# Patient Record
Sex: Female | Born: 1960 | Race: White | Hispanic: No | Marital: Married | Smoking: Former smoker
Health system: Southern US, Community
[De-identification: ages and names within clinical notes are randomized; demographics above are authoritative.]

## PROBLEM LIST (undated history)

## (undated) DIAGNOSIS — C3490 Malignant neoplasm of unspecified part of unspecified bronchus or lung: Secondary | ICD-10-CM

## (undated) DIAGNOSIS — C189 Malignant neoplasm of colon, unspecified: Secondary | ICD-10-CM

## (undated) DIAGNOSIS — Z9221 Personal history of antineoplastic chemotherapy: Secondary | ICD-10-CM

## (undated) DIAGNOSIS — Z8589 Personal history of malignant neoplasm of other organs and systems: Secondary | ICD-10-CM

## (undated) DIAGNOSIS — I10 Essential (primary) hypertension: Secondary | ICD-10-CM

## (undated) DIAGNOSIS — C55 Malignant neoplasm of uterus, part unspecified: Secondary | ICD-10-CM

## (undated) DIAGNOSIS — E119 Type 2 diabetes mellitus without complications: Secondary | ICD-10-CM

## (undated) HISTORY — PX: ABDOMINAL HYSTERECTOMY: SHX81

## (undated) HISTORY — PX: CHOLECYSTECTOMY: SHX55

## (undated) HISTORY — PX: COLONOSCOPY: SHX174

## (undated) HISTORY — PX: COLON SURGERY: SHX602

---

## 2001-09-16 DIAGNOSIS — Z9221 Personal history of antineoplastic chemotherapy: Secondary | ICD-10-CM

## 2001-09-16 DIAGNOSIS — C189 Malignant neoplasm of colon, unspecified: Secondary | ICD-10-CM

## 2001-09-16 HISTORY — DX: Malignant neoplasm of colon, unspecified: C18.9

## 2001-09-16 HISTORY — DX: Personal history of antineoplastic chemotherapy: Z92.21

## 2005-09-16 DIAGNOSIS — C55 Malignant neoplasm of uterus, part unspecified: Secondary | ICD-10-CM

## 2005-09-16 HISTORY — DX: Malignant neoplasm of uterus, part unspecified: C55

## 2006-01-29 ENCOUNTER — Ambulatory Visit: Payer: Self-pay

## 2006-04-02 ENCOUNTER — Ambulatory Visit: Payer: Self-pay | Admitting: Gynecologic Oncology

## 2006-04-25 ENCOUNTER — Inpatient Hospital Stay: Payer: Self-pay | Admitting: Obstetrics and Gynecology

## 2006-04-25 IMAGING — CR DG ABDOMEN 1V
1 series · 1 of 1 positions shown · non-contrast
Comparison: none

REASON FOR EXAM: stent insertion in the operating room
COMMENTS:

[view not recorded]
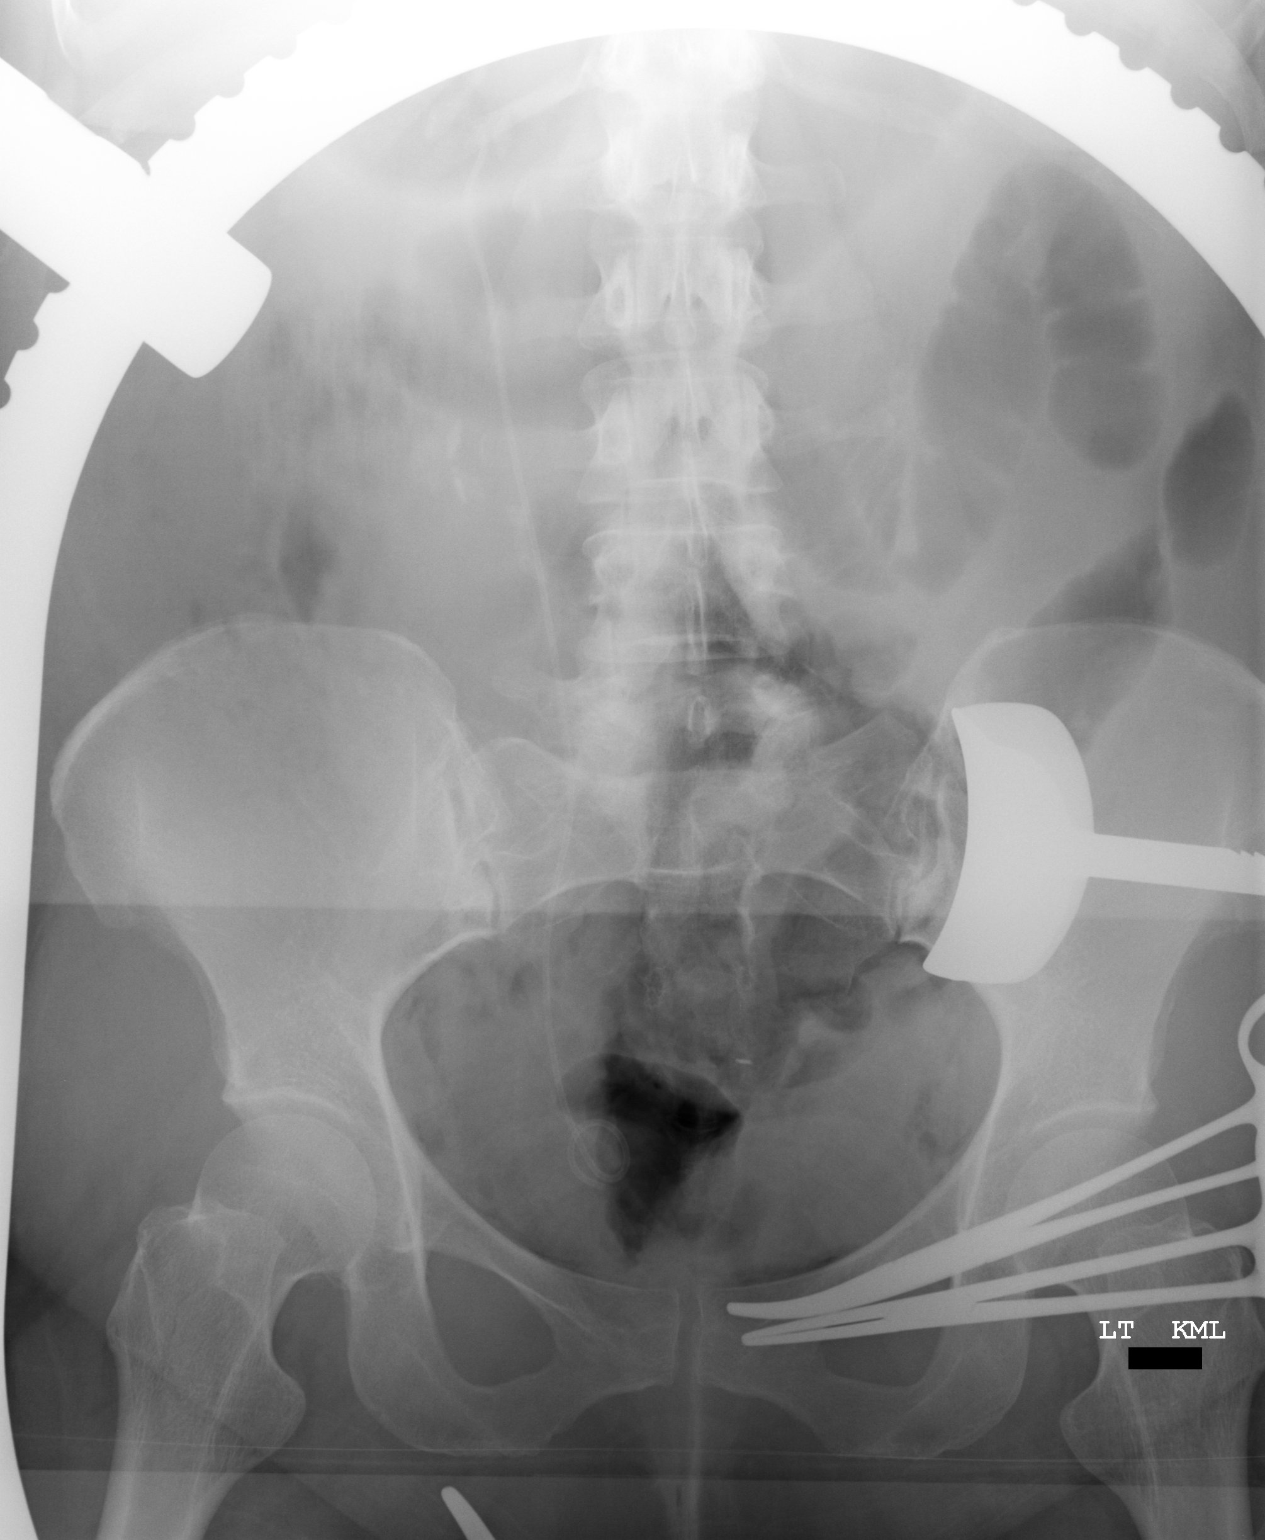

[1 of 1 positions shown; findings below may reference images not displayed]

PROCEDURE:     DXR - DXR KIDNEY URETER BLADDER  - [DATE]  [DATE]

RESULT:          The soft tissue structures are unremarkable.  A double-J
stent is noted on the RIGHT.  Surgical clips or calcifications are noted on
the RIGHT.  A similar finding is noted in the pelvis.  A straight and a
curved hemostat is noted projected over the LEFT pelvis.  What appears to be
a curved hemostat is projected over the RIGHT lower pelvis.  Retractors are
noted.  The densities noted in the RIGHT abdomen and in the pelvis are most
likely calcifications or surgical clips.
IMPRESSION: Good position of RIGHT ureteral double-J stent.

## 2006-05-14 ENCOUNTER — Ambulatory Visit: Payer: Self-pay | Admitting: Gynecologic Oncology

## 2006-05-17 ENCOUNTER — Ambulatory Visit: Payer: Self-pay | Admitting: Gynecologic Oncology

## 2007-04-30 ENCOUNTER — Ambulatory Visit: Payer: Self-pay | Admitting: Obstetrics and Gynecology

## 2007-04-30 IMAGING — CT CT ABD-PELV W/ CM
1 of 2 series · 15 of 32 positions shown, 19 images · non-contrast
Comparison: none

REASON FOR EXAM: History of colon CA
COMMENTS:

[Series 2: abdomen · axial · 0.94mm/px · z∈[+240,+688]mm · 15 of 62 slices shown, 19 images]
[im 3/62  soft-tissue]
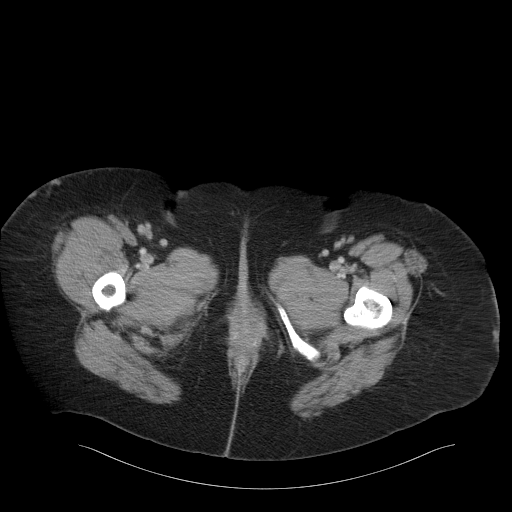
[im 3/62  bone]
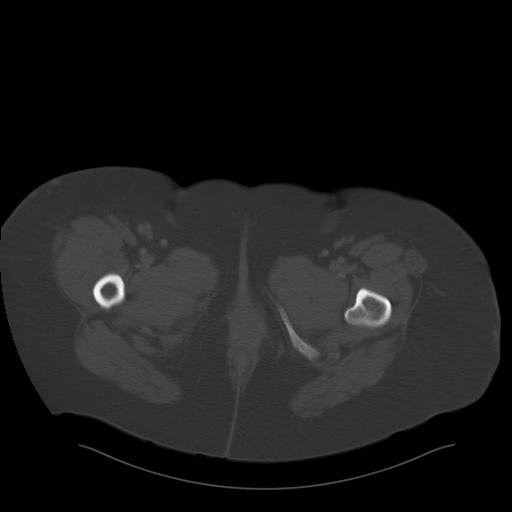
[im 8/62  soft-tissue]
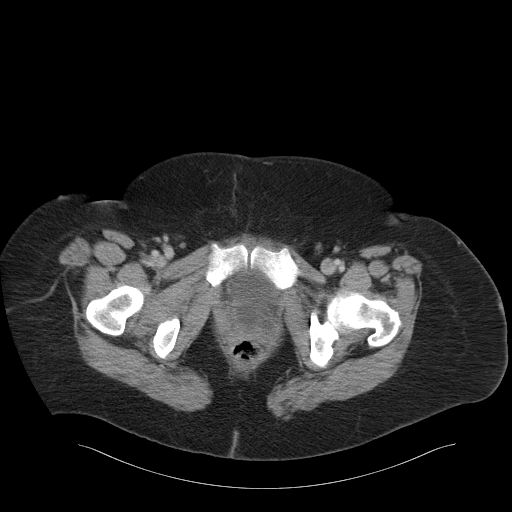
[im 13/62  soft-tissue]
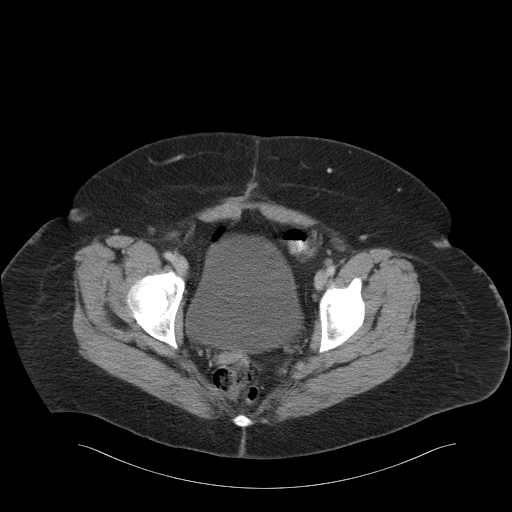
[im 18/62  soft-tissue]
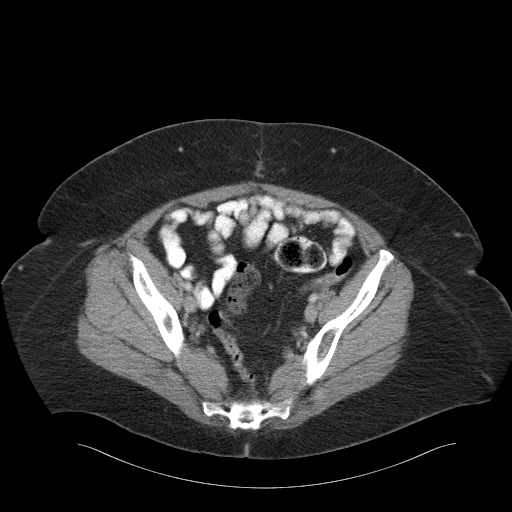
[im 21/62  soft-tissue]
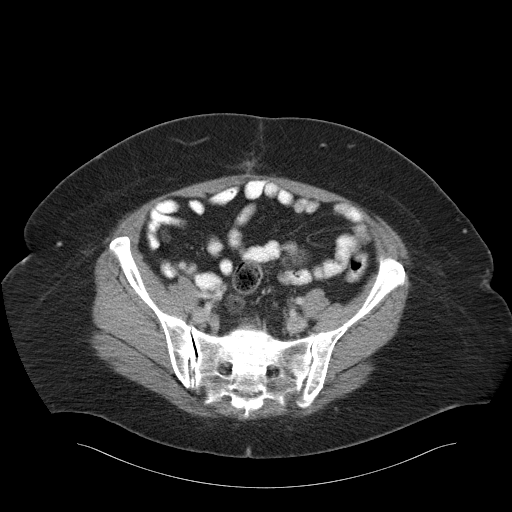
[im 26/62  soft-tissue]
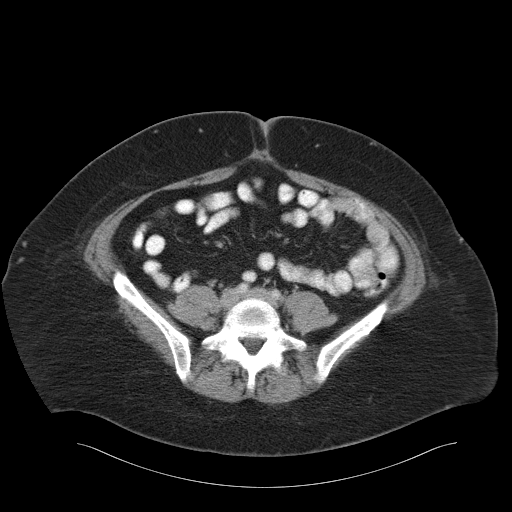
[im 31/62  soft-tissue]
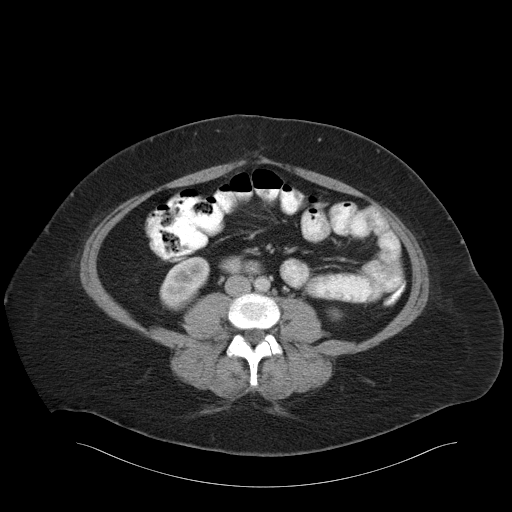
[im 36/62  soft-tissue]
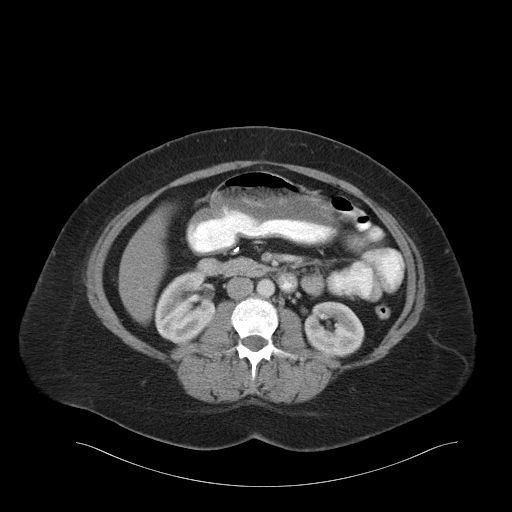
[im 41/62  soft-tissue]
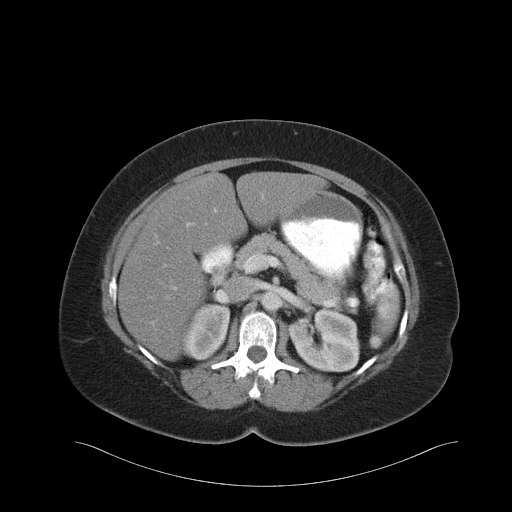
[im 41/62  bone]
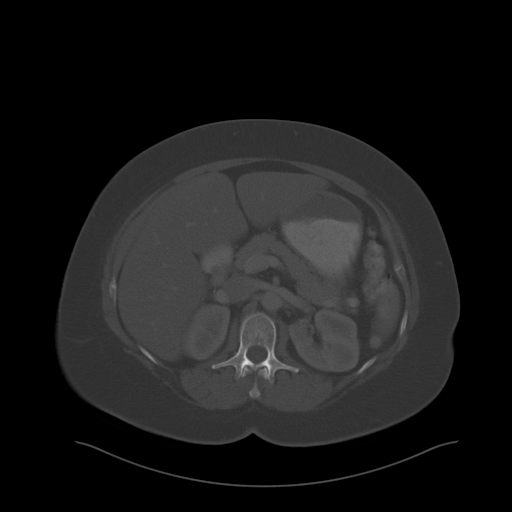
[im 44/62  soft-tissue]
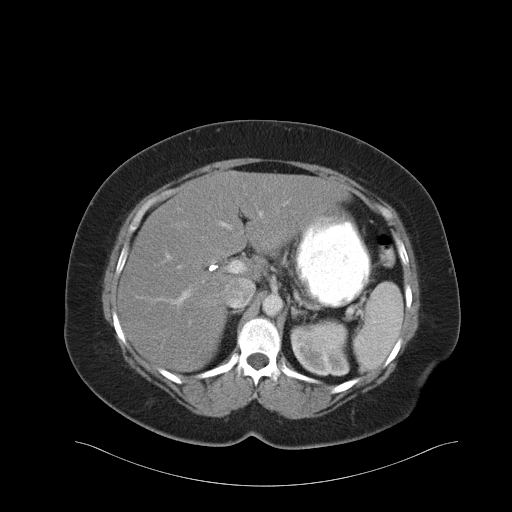
[im 49/62  soft-tissue]
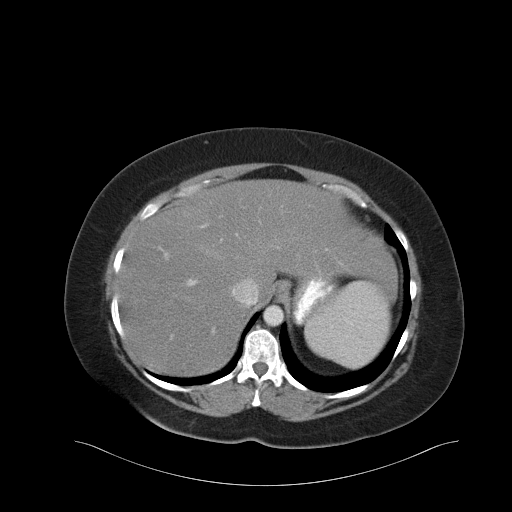
[im 51/62  lung]
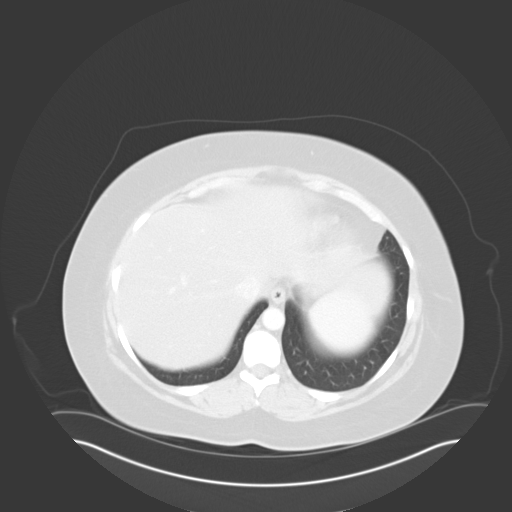
[im 54/62  soft-tissue]
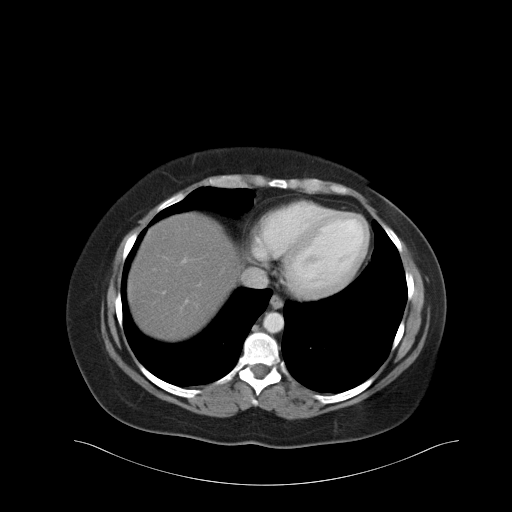
[im 54/62  lung]
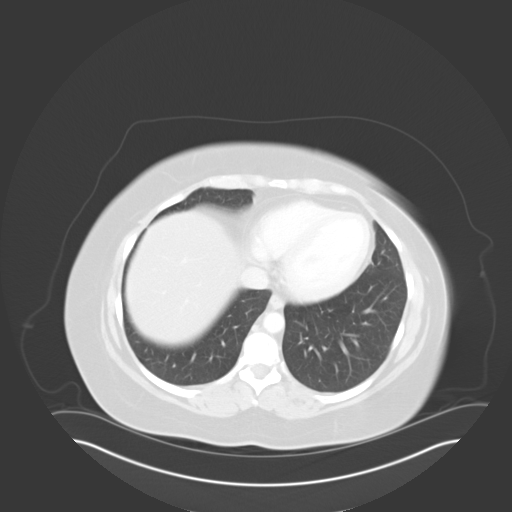
[im 56/62  lung]
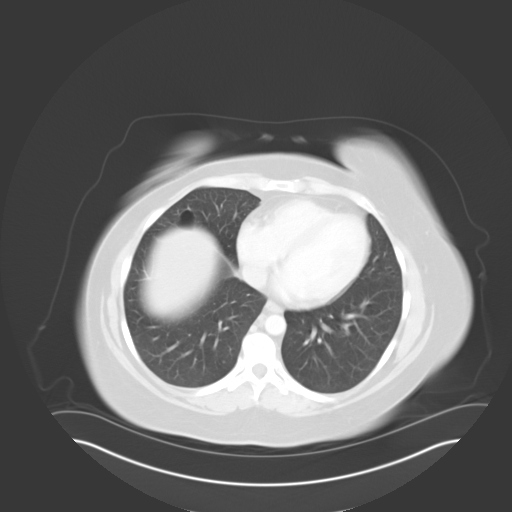
[im 59/62  soft-tissue]
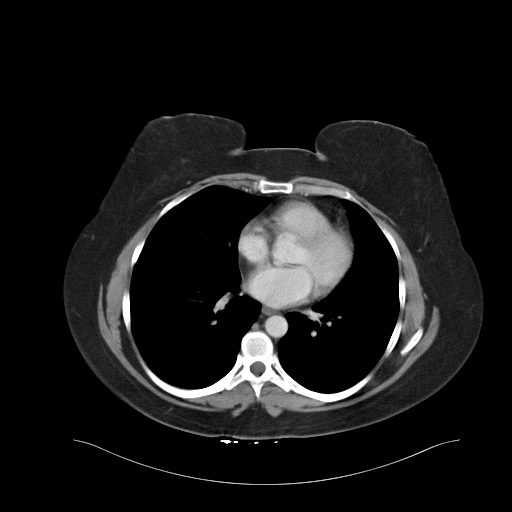
[im 59/62  lung]
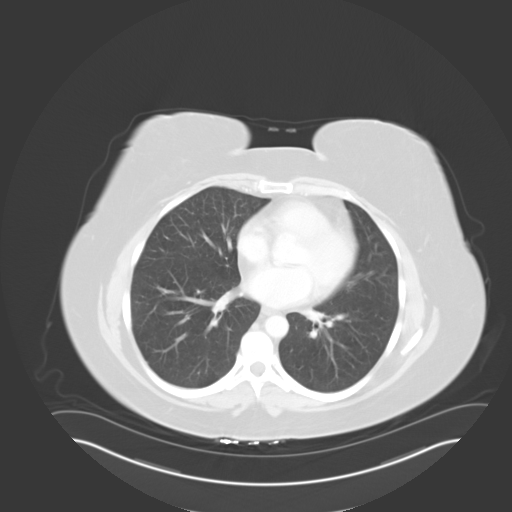

[15 of 32 positions shown; findings below may reference images not displayed]

PROCEDURE:     CT  - CT ABDOMEN / PELVIS  W  - [DATE]  [DATE]

RESULT:     The patient has a history of colonic malignancy.

Patient received 85 ml of [KK] as well as oral contrast material.

The liver exhibits normal density with no evidence of a mass or ductal
dilation. The gallbladder is surgically absent. The partially distended
stomach, pancreas, spleen and adrenal glands exhibit no acute abnormality.
The kidneys enhance well with no evidence of obstruction or masses. The
caliber of the abdominal aorta is normal. I see no periaortic or pericaval
lymphadenopathy.

The partially contrast filled loops of small and large bowel are normal in
appearance. There is no free fluid in the abdomen or pelvis. The uterus is
apparently surgically absent. There are no adnexal masses. The partially
distended urinary bladder is normal in appearance. The lung bases are clear.
The lumbar vertebral bodies are preserved in height.
IMPRESSION: 1.  There are no findings suspicious for metastatic disease to the liver or
retroperitoneum or mesentery.
2.  There is no evidence of acute hepatobiliary abnormality otherwise or
acute urinary tract abnormality.
3.  There is no evidence of bowel obstruction.

## 2007-11-06 ENCOUNTER — Ambulatory Visit: Payer: Self-pay | Admitting: Obstetrics and Gynecology

## 2008-01-28 ENCOUNTER — Ambulatory Visit: Payer: Self-pay | Admitting: Family Medicine

## 2010-11-19 ENCOUNTER — Ambulatory Visit: Payer: Self-pay | Admitting: Surgery

## 2010-11-19 IMAGING — CR DG CHEST 2V
1 series · 2 of 2 positions shown · non-contrast
Comparison: none

REASON FOR EXAM: htn,diabetes
COMMENTS:

PROCEDURE:     DXR - DXR CHEST PA (OR AP) AND LATERAL  - [DATE] [DATE]
RESULT:     The lungs are clear. The cardiac silhouette and visualized bony
skeleton are unremarkable.

[Series 1: view not recorded · 0.17mm/px · 2 of 2 slices shown]
[im 1/2]
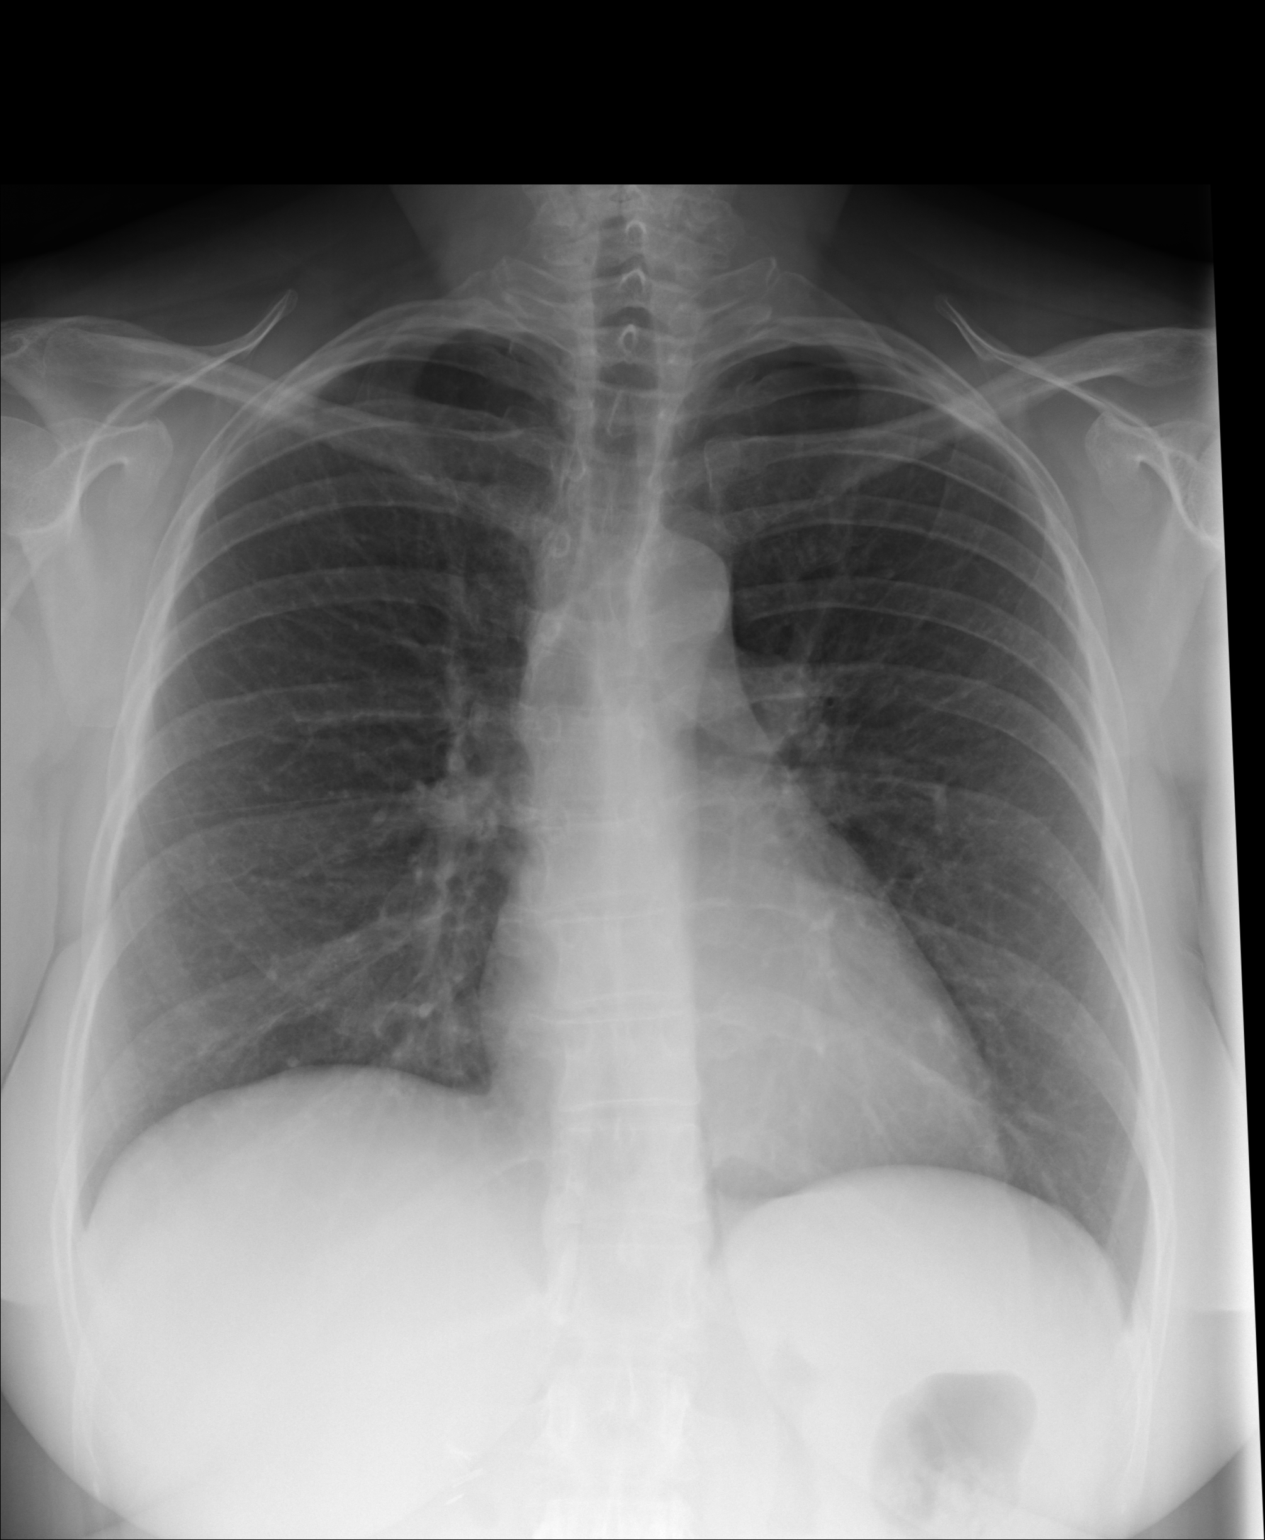
[im 2/2]
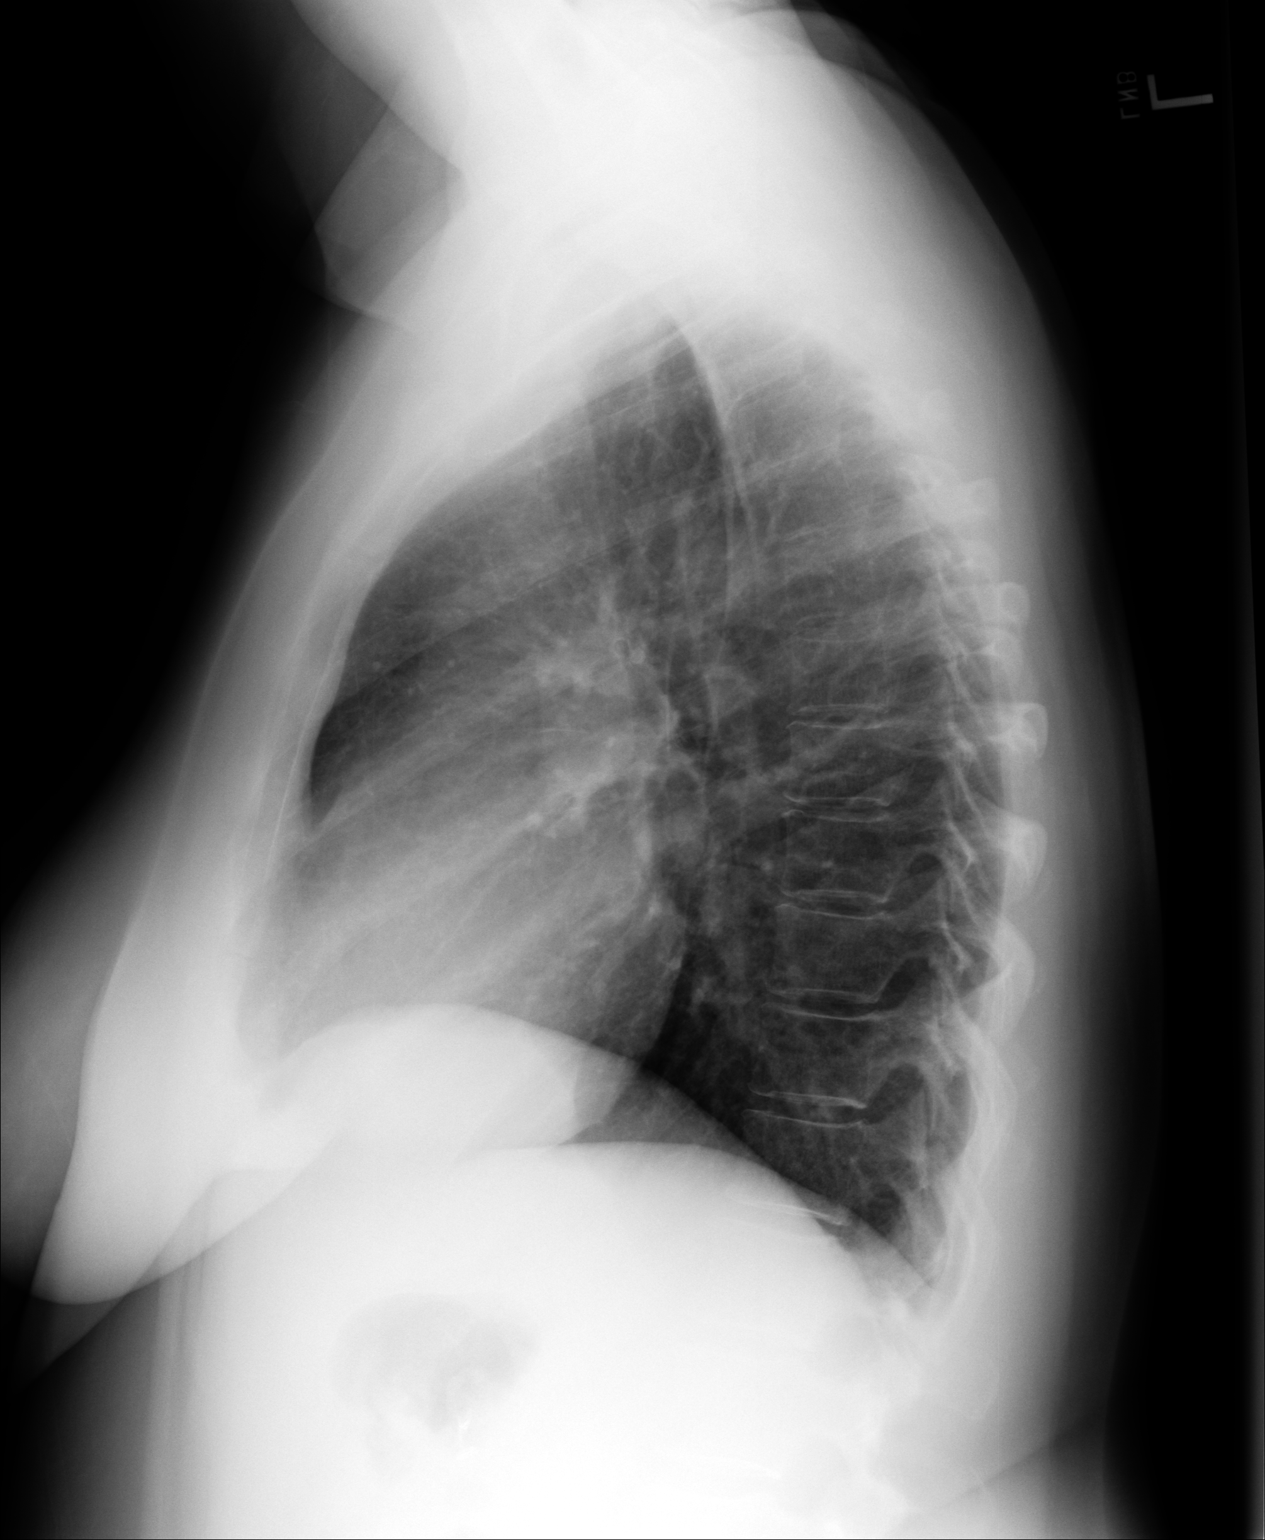

[2 of 2 positions shown; findings below may reference images not displayed]

IMPRESSION: 1. Chest radiograph without evidence of acute cardiopulmonary disease.

## 2010-11-26 ENCOUNTER — Ambulatory Visit: Payer: Self-pay | Admitting: Surgery

## 2010-11-26 HISTORY — PX: BREAST EXCISIONAL BIOPSY: SUR124

## 2010-11-27 LAB — PATHOLOGY REPORT

## 2011-03-14 ENCOUNTER — Emergency Department: Payer: Self-pay | Admitting: Unknown Physician Specialty

## 2016-06-17 DIAGNOSIS — E7800 Pure hypercholesterolemia, unspecified: Secondary | ICD-10-CM | POA: Insufficient documentation

## 2016-06-17 DIAGNOSIS — E119 Type 2 diabetes mellitus without complications: Secondary | ICD-10-CM | POA: Insufficient documentation

## 2016-06-17 DIAGNOSIS — I10 Essential (primary) hypertension: Secondary | ICD-10-CM | POA: Insufficient documentation

## 2016-07-29 ENCOUNTER — Other Ambulatory Visit: Payer: Self-pay | Admitting: Internal Medicine

## 2016-07-29 ENCOUNTER — Ambulatory Visit
Admission: RE | Admit: 2016-07-29 | Discharge: 2016-07-29 | Disposition: A | Discharge status: Home / Self Care | Payer: BLUE CROSS/BLUE SHIELD | Source: Ambulatory Visit | Attending: Internal Medicine | Admitting: Internal Medicine

## 2016-07-29 DIAGNOSIS — R51 Headache: Principal | ICD-10-CM

## 2016-07-29 DIAGNOSIS — I6523 Occlusion and stenosis of bilateral carotid arteries: Secondary | ICD-10-CM | POA: Insufficient documentation

## 2016-07-29 DIAGNOSIS — R519 Headache, unspecified: Secondary | ICD-10-CM

## 2016-07-29 IMAGING — CT CT HEAD W/O CM
1 series · 16 of 27 positions shown, 20 images · non-contrast
Comparison: None.

CLINICAL DATA: Right parietal headache for 2 weeks.

EXAM:
CT HEAD WITHOUT CONTRAST
TECHNIQUE: Contiguous axial images were obtained from the base of the skull
through the vertex without intravenous contrast.

[Series 2: head wo · axial · 0.39mm/px · z∈[-138,-18]mm · 16 of 27 slices shown, 20 images]
[im 2/27  brain]
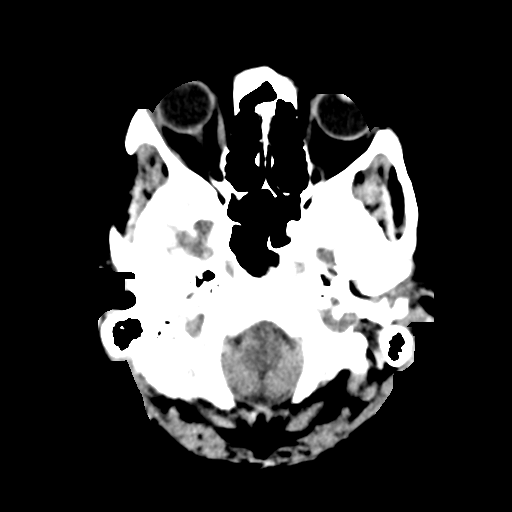
[im 2/27  bone]
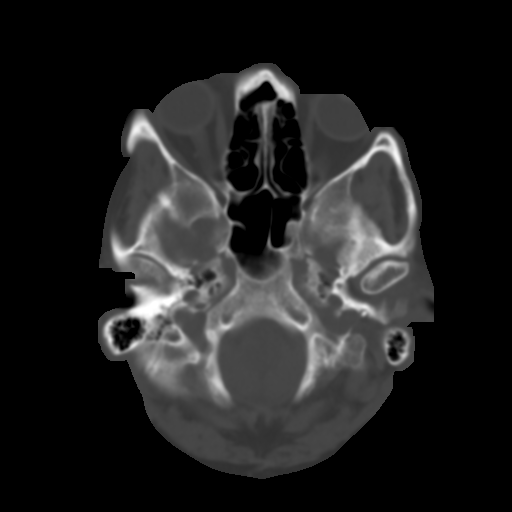
[im 4/27  brain]
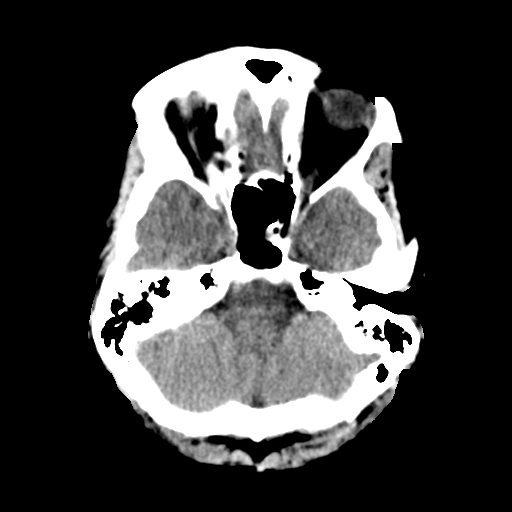
[im 5/27  brain]
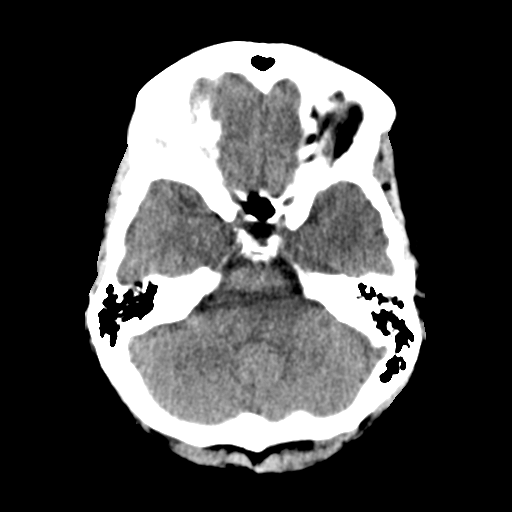
[im 7/27  brain]
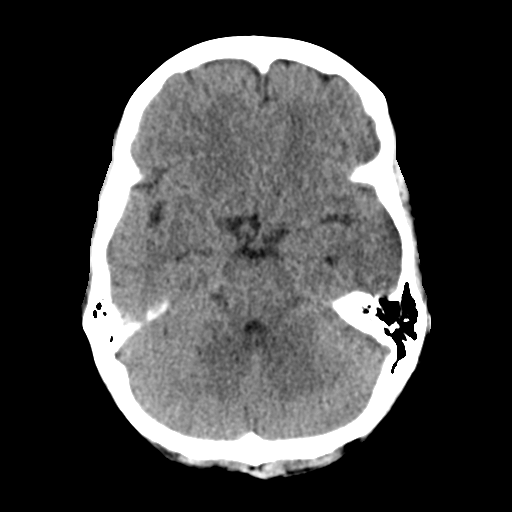
[im 9/27  brain]
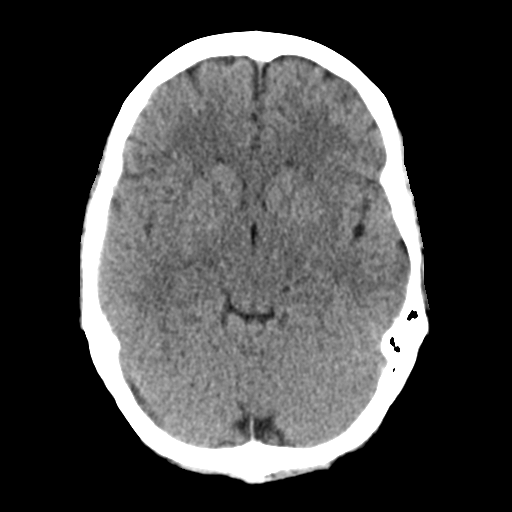
[im 9/27  bone]
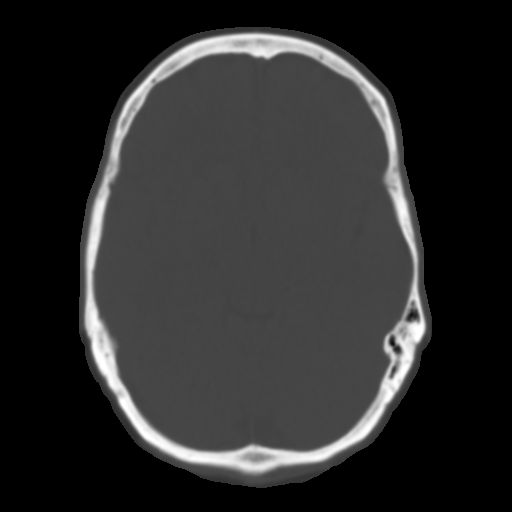
[im 10/27  brain]
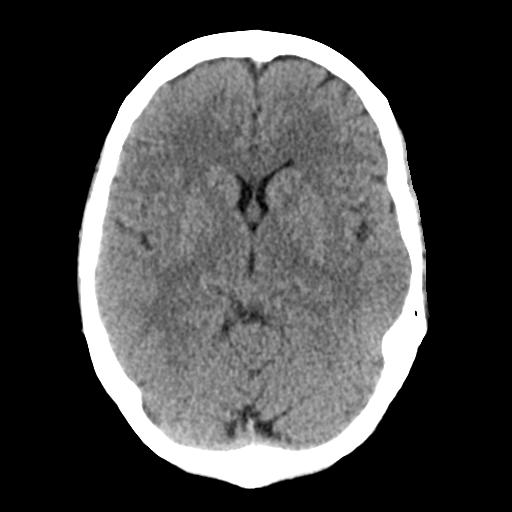
[im 12/27  brain]
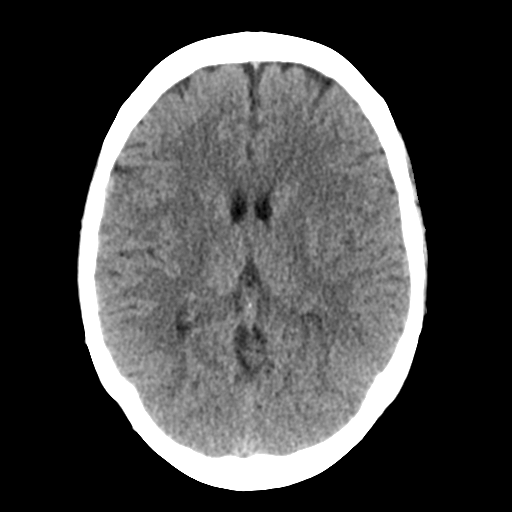
[im 13/27  brain]
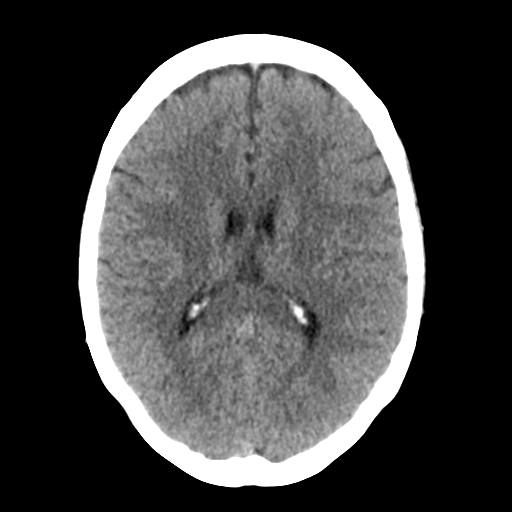
[im 15/27  brain]
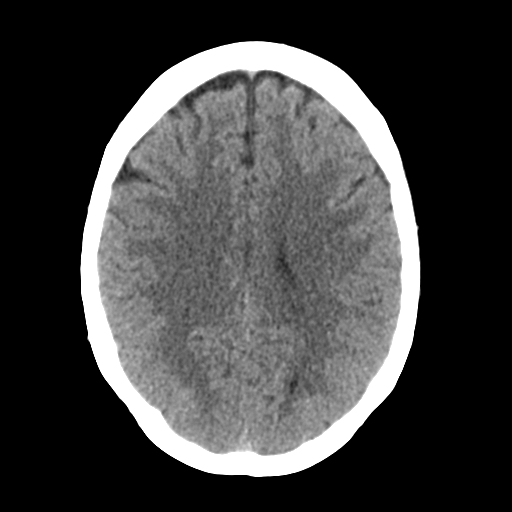
[im 15/27  bone]
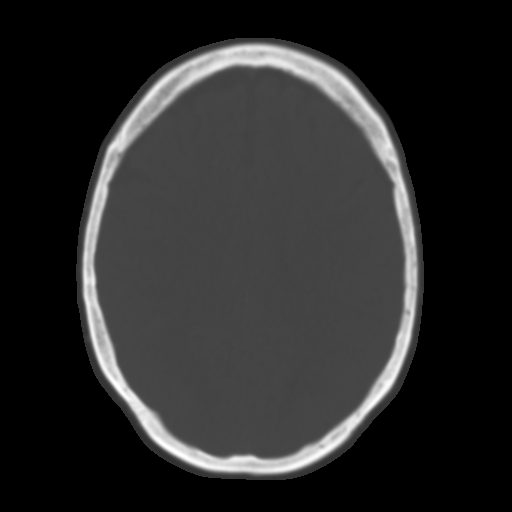
[im 16/27  brain]
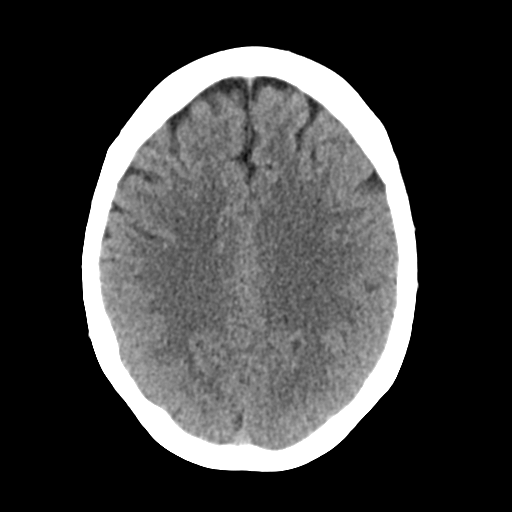
[im 18/27  brain]
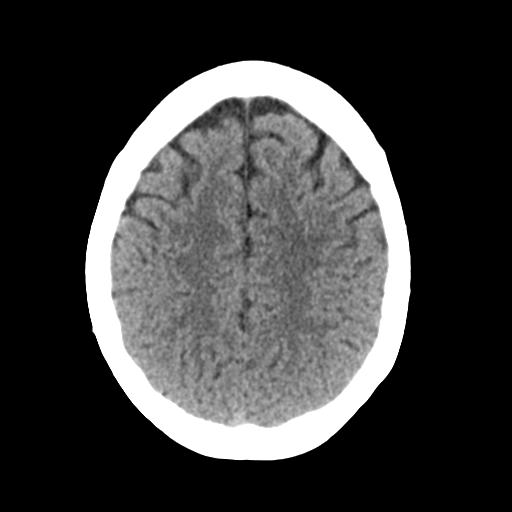
[im 19/27  brain]
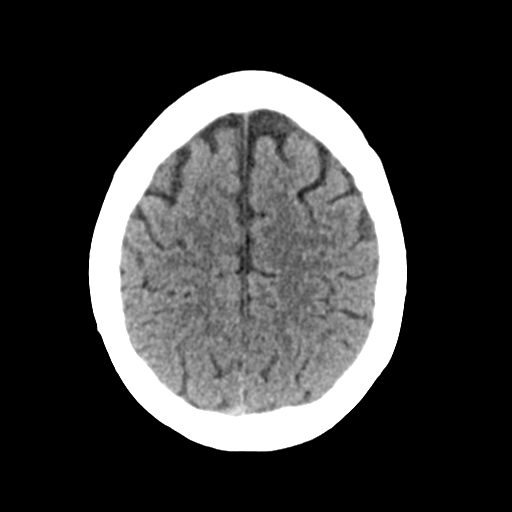
[im 21/27  brain]
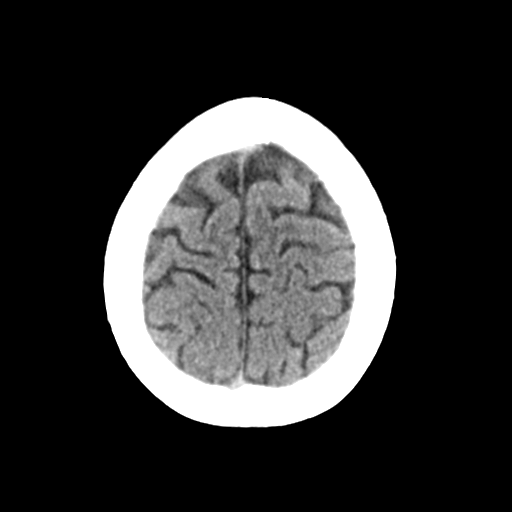
[im 21/27  bone]
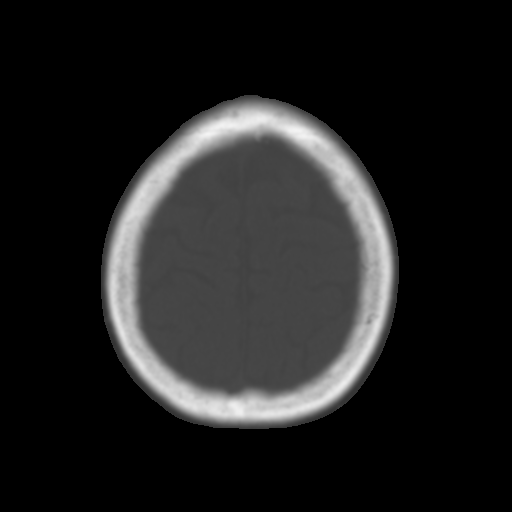
[im 23/27  brain]
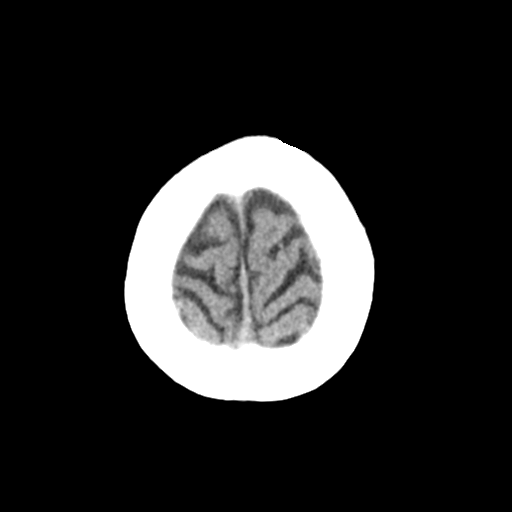
[im 24/27  brain]
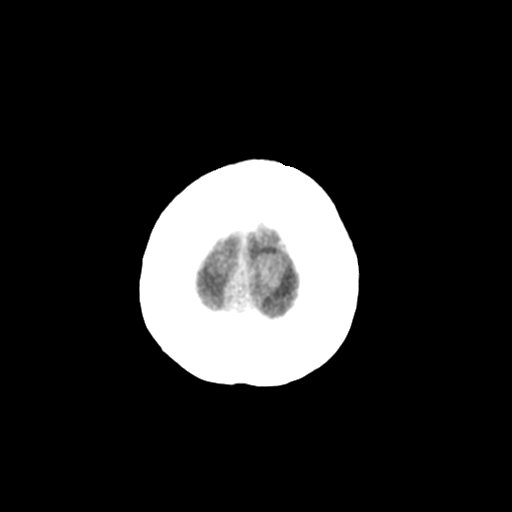
[im 26/27  brain]
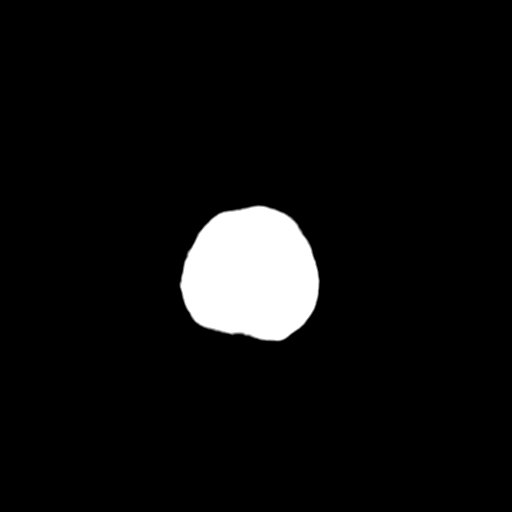

[16 of 27 positions shown; findings below may reference images not displayed]

FINDINGS: Brain: There is no evidence for acute hemorrhage, hydrocephalus,
mass lesion, or abnormal extra-axial fluid collection. No definite
CT evidence for acute infarction.

Vascular: Atherosclerotic calcification is visualized in the carotid
arteries. No dense MCA sign. Major dural sinuses are unremarkable.

Skull: No evidence for fracture. No worrisome lytic or sclerotic
lesion.

Sinuses/Orbits: The visualized paranasal sinuses and mastoid air
cells are clear. Visualized portions of the globes and intraorbital
fat are unremarkable.

Other: None.
IMPRESSION: No acute intracranial abnormality.

## 2017-09-21 DIAGNOSIS — M159 Polyosteoarthritis, unspecified: Secondary | ICD-10-CM | POA: Insufficient documentation

## 2017-09-21 DIAGNOSIS — M150 Primary generalized (osteo)arthritis: Secondary | ICD-10-CM | POA: Insufficient documentation

## 2017-09-21 DIAGNOSIS — M7062 Trochanteric bursitis, left hip: Secondary | ICD-10-CM | POA: Insufficient documentation

## 2017-11-24 ENCOUNTER — Other Ambulatory Visit: Payer: Self-pay | Admitting: Internal Medicine

## 2017-11-24 DIAGNOSIS — Z1231 Encounter for screening mammogram for malignant neoplasm of breast: Secondary | ICD-10-CM

## 2017-12-05 ENCOUNTER — Encounter: Payer: Self-pay | Admitting: Radiology

## 2017-12-05 ENCOUNTER — Ambulatory Visit
Admission: RE | Admit: 2017-12-05 | Discharge: 2017-12-05 | Disposition: A | Discharge status: Home / Self Care | Payer: BLUE CROSS/BLUE SHIELD | Source: Ambulatory Visit | Attending: Internal Medicine | Admitting: Internal Medicine

## 2017-12-05 DIAGNOSIS — Z1231 Encounter for screening mammogram for malignant neoplasm of breast: Secondary | ICD-10-CM | POA: Insufficient documentation

## 2017-12-05 HISTORY — DX: Malignant neoplasm of uterus, part unspecified: C55

## 2017-12-05 HISTORY — DX: Personal history of antineoplastic chemotherapy: Z92.21

## 2017-12-05 HISTORY — DX: Malignant neoplasm of colon, unspecified: C18.9

## 2017-12-05 IMAGING — MG MM DIGITAL SCREENING BILAT W/ CAD
5 series · 5 of 5 positions shown · non-contrast
Comparison: Previous exam(s).

CLINICAL DATA: Screening.

EXAM:
DIGITAL SCREENING BILATERAL MAMMOGRAM WITH CAD

[R MLO (1 of 2)]
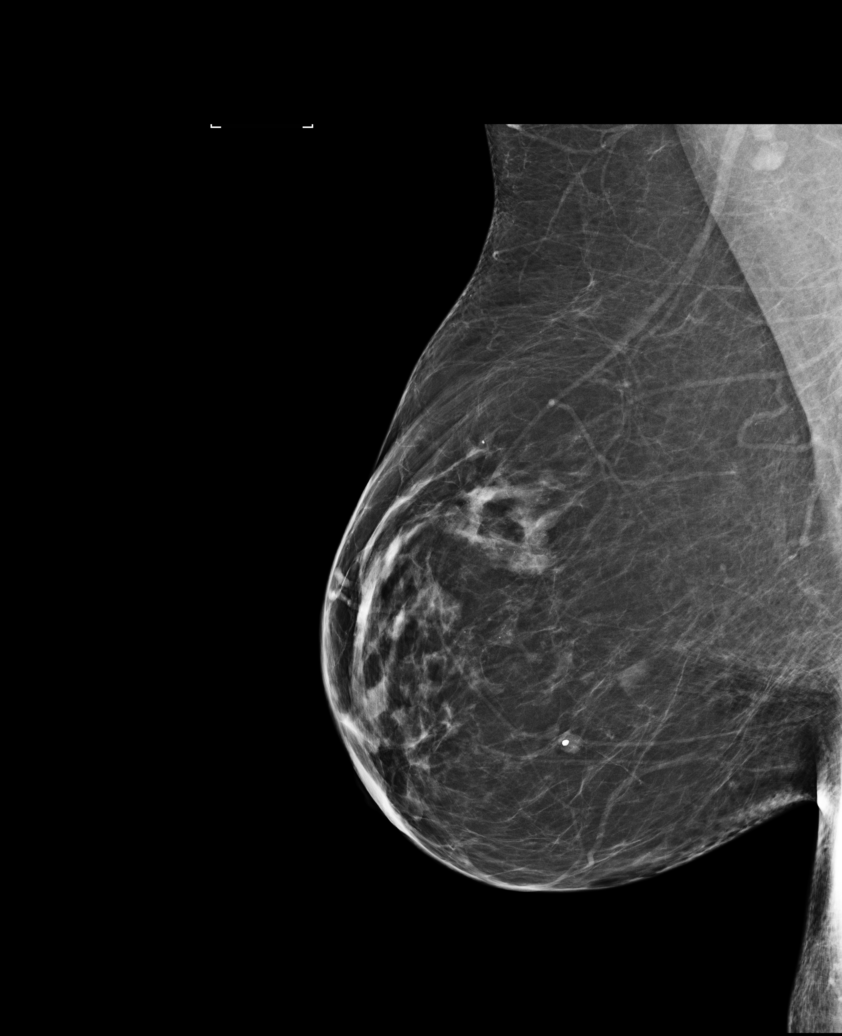

[R CC]
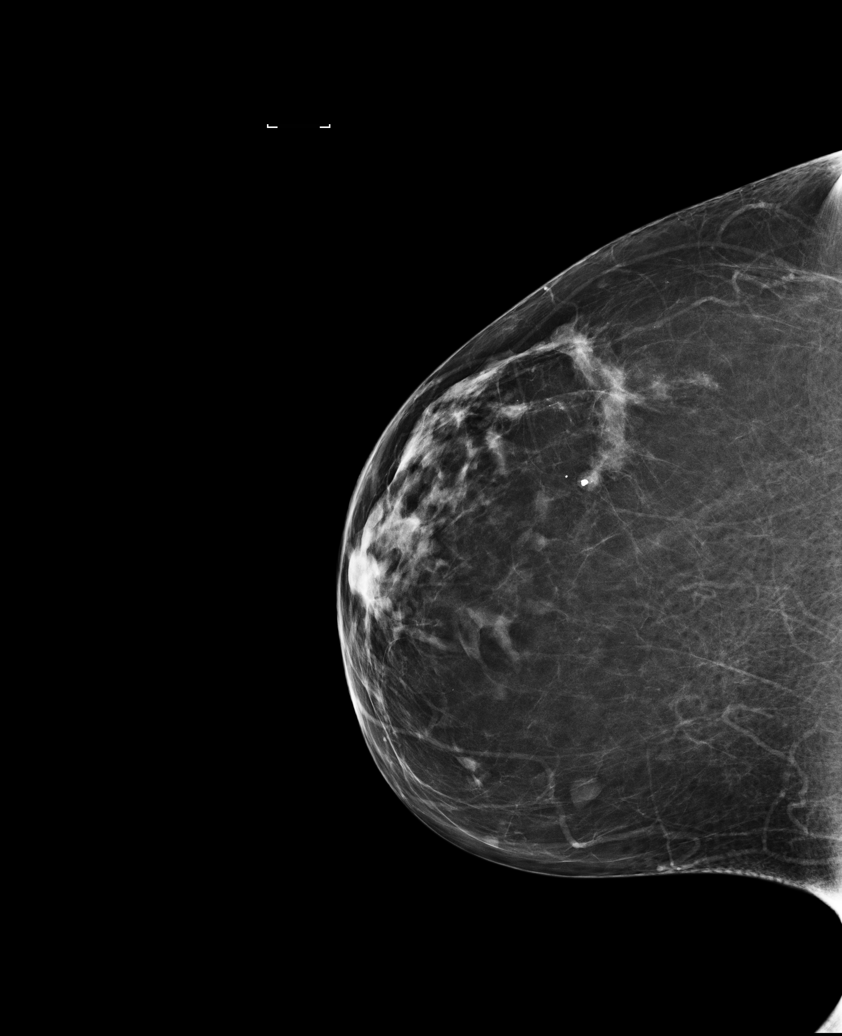

[L CC]
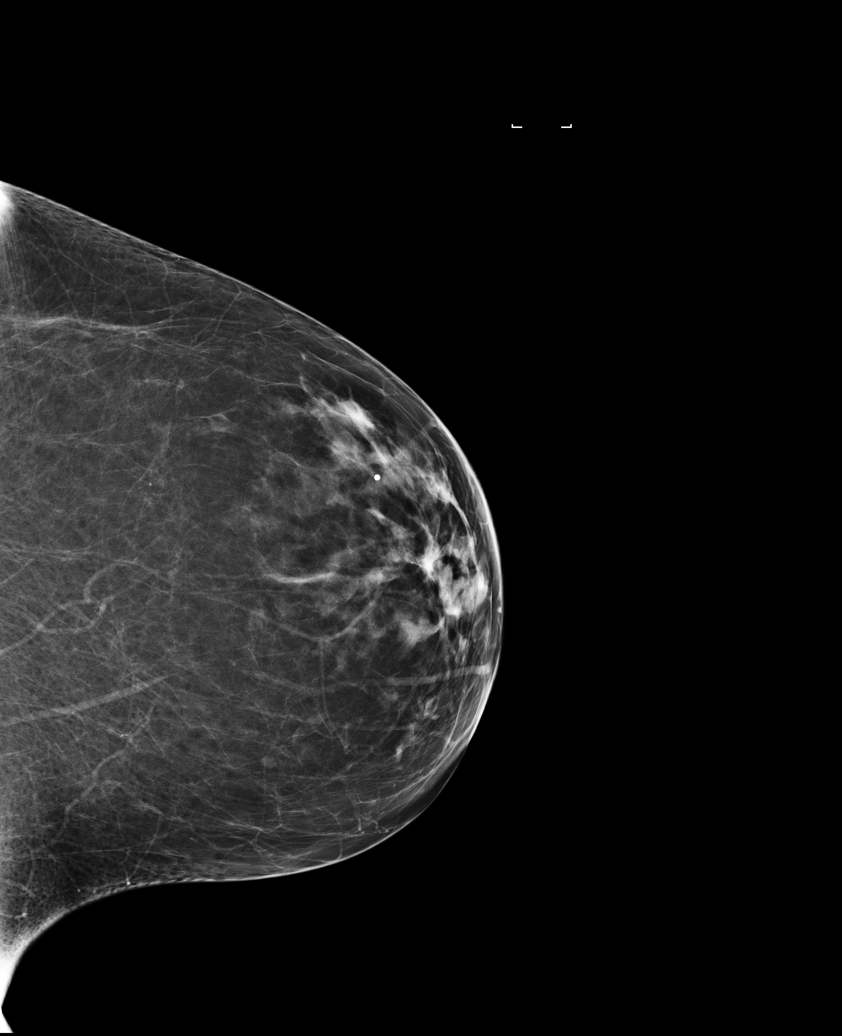

[R MLO (2 of 2)]
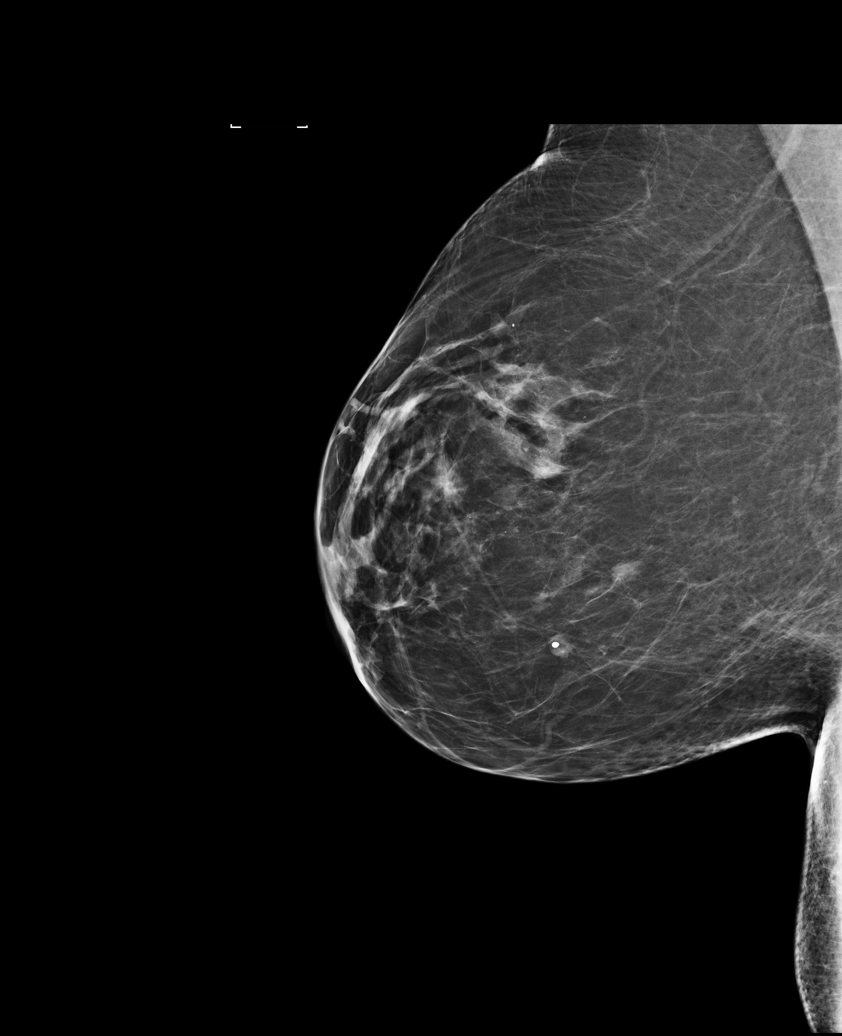

[L MLO]
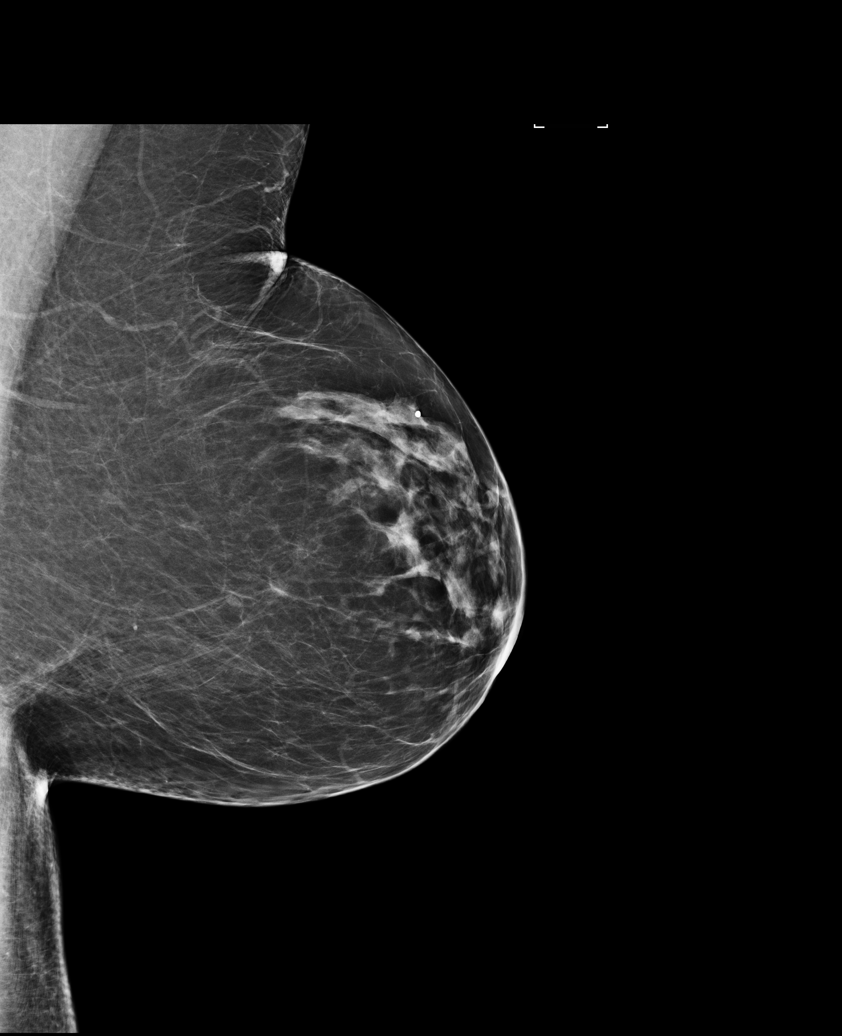

[5 of 5 positions shown; findings below may reference images not displayed]

ACR Breast Density Category b: There are scattered areas of
fibroglandular density.
FINDINGS: There are no findings suspicious for malignancy. Images were
processed with CAD.
IMPRESSION: No mammographic evidence of malignancy. A result letter of this
screening mammogram will be mailed directly to the patient.

RECOMMENDATION:
Screening mammogram in one year. (Code:[US])

BI-RADS CATEGORY  1: Negative.

## 2017-12-17 DIAGNOSIS — Z780 Asymptomatic menopausal state: Secondary | ICD-10-CM | POA: Insufficient documentation

## 2017-12-17 DIAGNOSIS — E039 Hypothyroidism, unspecified: Secondary | ICD-10-CM | POA: Insufficient documentation

## 2018-03-26 DIAGNOSIS — D649 Anemia, unspecified: Secondary | ICD-10-CM | POA: Insufficient documentation

## 2018-11-16 ENCOUNTER — Other Ambulatory Visit: Payer: Self-pay | Admitting: Internal Medicine

## 2018-11-16 DIAGNOSIS — Z1231 Encounter for screening mammogram for malignant neoplasm of breast: Secondary | ICD-10-CM

## 2018-12-10 ENCOUNTER — Encounter: Payer: Self-pay | Admitting: *Deleted

## 2018-12-11 ENCOUNTER — Ambulatory Visit
Admission: RE | Admit: 2018-12-11 | Discharge: 2018-12-11 | Disposition: A | Discharge status: Home / Self Care | Payer: BLUE CROSS/BLUE SHIELD | Attending: Gastroenterology | Admitting: Gastroenterology

## 2018-12-11 ENCOUNTER — Ambulatory Visit: Payer: BLUE CROSS/BLUE SHIELD | Admitting: Anesthesiology

## 2018-12-11 ENCOUNTER — Encounter: Payer: Self-pay | Admitting: *Deleted

## 2018-12-11 ENCOUNTER — Encounter
Admission: RE | Disposition: A | Discharge status: Home / Self Care | Payer: Self-pay | Source: Home / Self Care | Attending: Gastroenterology

## 2018-12-11 DIAGNOSIS — Z9221 Personal history of antineoplastic chemotherapy: Secondary | ICD-10-CM | POA: Insufficient documentation

## 2018-12-11 DIAGNOSIS — Z7984 Long term (current) use of oral hypoglycemic drugs: Secondary | ICD-10-CM | POA: Diagnosis not present

## 2018-12-11 DIAGNOSIS — Z09 Encounter for follow-up examination after completed treatment for conditions other than malignant neoplasm: Secondary | ICD-10-CM | POA: Insufficient documentation

## 2018-12-11 DIAGNOSIS — Q439 Congenital malformation of intestine, unspecified: Secondary | ICD-10-CM | POA: Diagnosis not present

## 2018-12-11 DIAGNOSIS — Z85038 Personal history of other malignant neoplasm of large intestine: Secondary | ICD-10-CM | POA: Diagnosis not present

## 2018-12-11 DIAGNOSIS — Z8542 Personal history of malignant neoplasm of other parts of uterus: Secondary | ICD-10-CM | POA: Insufficient documentation

## 2018-12-11 DIAGNOSIS — K635 Polyp of colon: Secondary | ICD-10-CM | POA: Diagnosis not present

## 2018-12-11 DIAGNOSIS — Z980 Intestinal bypass and anastomosis status: Secondary | ICD-10-CM | POA: Diagnosis not present

## 2018-12-11 DIAGNOSIS — Z86010 Personal history of colonic polyps: Secondary | ICD-10-CM | POA: Diagnosis not present

## 2018-12-11 DIAGNOSIS — E119 Type 2 diabetes mellitus without complications: Secondary | ICD-10-CM | POA: Insufficient documentation

## 2018-12-11 DIAGNOSIS — D126 Benign neoplasm of colon, unspecified: Secondary | ICD-10-CM | POA: Diagnosis not present

## 2018-12-11 DIAGNOSIS — E039 Hypothyroidism, unspecified: Secondary | ICD-10-CM | POA: Diagnosis not present

## 2018-12-11 DIAGNOSIS — I10 Essential (primary) hypertension: Secondary | ICD-10-CM | POA: Diagnosis not present

## 2018-12-11 DIAGNOSIS — K219 Gastro-esophageal reflux disease without esophagitis: Secondary | ICD-10-CM | POA: Diagnosis not present

## 2018-12-11 DIAGNOSIS — Z79899 Other long term (current) drug therapy: Secondary | ICD-10-CM | POA: Diagnosis not present

## 2018-12-11 DIAGNOSIS — K621 Rectal polyp: Secondary | ICD-10-CM | POA: Diagnosis not present

## 2018-12-11 DIAGNOSIS — F17200 Nicotine dependence, unspecified, uncomplicated: Secondary | ICD-10-CM | POA: Diagnosis not present

## 2018-12-11 DIAGNOSIS — Z79890 Hormone replacement therapy (postmenopausal): Secondary | ICD-10-CM | POA: Diagnosis not present

## 2018-12-11 HISTORY — DX: Type 2 diabetes mellitus without complications: E11.9

## 2018-12-11 HISTORY — DX: Essential (primary) hypertension: I10

## 2018-12-11 HISTORY — PX: COLONOSCOPY WITH PROPOFOL: SHX5780

## 2018-12-11 LAB — GLUCOSE, CAPILLARY: GLUCOSE-CAPILLARY: 128 mg/dL — AB (ref 70–99)

## 2018-12-11 SURGERY — COLONOSCOPY WITH PROPOFOL
Anesthesia: General

## 2018-12-11 MED ORDER — PROPOFOL 500 MG/50ML IV EMUL
INTRAVENOUS | Status: DC | PRN
Start: 2018-12-11 — End: 2018-12-11
  Administered 2018-12-11: 150 ug/kg/min via INTRAVENOUS

## 2018-12-11 MED ORDER — LIDOCAINE HCL (CARDIAC) PF 100 MG/5ML IV SOSY
PREFILLED_SYRINGE | INTRAVENOUS | Status: DC | PRN
  Administered 2018-12-11: 40 mg via INTRAVENOUS

## 2018-12-11 MED ORDER — PROPOFOL 500 MG/50ML IV EMUL
INTRAVENOUS | Status: AC
  Filled 2018-12-11: qty 50

## 2018-12-11 MED ORDER — SODIUM CHLORIDE 0.9 % IV SOLN
INTRAVENOUS | Status: DC
  Administered 2018-12-11: 07:00:00 via INTRAVENOUS

## 2018-12-11 MED ORDER — MIDAZOLAM HCL 2 MG/2ML IJ SOLN
INTRAMUSCULAR | Status: DC | PRN
  Administered 2018-12-11: 2 mg via INTRAVENOUS

## 2018-12-11 MED ORDER — MIDAZOLAM HCL 2 MG/2ML IJ SOLN
INTRAMUSCULAR | Status: AC
Start: 2018-12-11 — End: ?
  Filled 2018-12-11: qty 2

## 2018-12-11 MED ORDER — PHENYLEPHRINE HCL 10 MG/ML IJ SOLN
INTRAMUSCULAR | Status: DC | PRN
  Administered 2018-12-11: 100 ug via INTRAVENOUS
  Administered 2018-12-11 (×2): 50 ug via INTRAVENOUS

## 2018-12-11 MED ORDER — PROPOFOL 10 MG/ML IV BOLUS
INTRAVENOUS | Status: DC | PRN
  Administered 2018-12-11: 10 mg via INTRAVENOUS
  Administered 2018-12-11: 70 mg via INTRAVENOUS
  Administered 2018-12-11: 20 mg via INTRAVENOUS

## 2018-12-11 NOTE — Anesthesia Preprocedure Evaluation (Addendum)
Anesthesia Evaluation  Patient identified by MRN, date of birth, ID band Patient awake    Reviewed: Allergy & Precautions, NPO status , Patient's Chart, lab work & pertinent test results  History of Anesthesia Complications Negative for: history of anesthetic complications  Airway Mallampati: III       Dental   Pulmonary neg sleep apnea, neg COPD, Current Smoker,           Cardiovascular hypertension, Pt. on medications (-) Past MI and (-) CHF (-) dysrhythmias (-) Valvular Problems/Murmurs     Neuro/Psych neg Seizures    GI/Hepatic GERD  Medicated,  Endo/Other  diabetes, Type 2, Oral Hypoglycemic AgentsHypothyroidism   Renal/GU      Musculoskeletal   Abdominal   Peds  Hematology   Anesthesia Other Findings   Reproductive/Obstetrics                            Anesthesia Physical Anesthesia Plan  ASA: III  Anesthesia Plan: General   Post-op Pain Management:    Induction:   PONV Risk Score and Plan: 2 and Propofol infusion and TIVA  Airway Management Planned:   Additional Equipment:   Intra-op Plan:   Post-operative Plan:   Informed Consent: I have reviewed the patients History and Physical, chart, labs and discussed the procedure including the risks, benefits and alternatives for the proposed anesthesia with the patient or authorized representative who has indicated his/her understanding and acceptance.       Plan Discussed with:   Anesthesia Plan Comments:         Anesthesia Quick Evaluation

## 2018-12-11 NOTE — Anesthesia Procedure Notes (Signed)
Date/Time: 12/11/2018 7:32 AM Performed by: Doreen Salvage, CRNA Pre-anesthesia Checklist: Patient identified, Emergency Drugs available, Suction available and Patient being monitored Patient Re-evaluated:Patient Re-evaluated prior to induction Oxygen Delivery Method: Nasal cannula Induction Type: IV induction Dental Injury: Teeth and Oropharynx as per pre-operative assessment  Comments: Nasal cannula with etCO2 monitoring

## 2018-12-11 NOTE — Op Note (Signed)
[facility] Gastroenterology Patient Name: Pamela [name] Procedure Date: 12/11/2018 7:28 AM MRN: [id number] Account #: [id number] Date of Birth: 1961-01-27 Admit Type: Outpatient Age: 58 Room: [facility] ENDO ROOM 4 Gender: Female Note Status: Finalized Procedure:            Colonoscopy Indications:          Personal history of malignant neoplasm of the colon,                        Personal history of colonic polyps Providers:            Lollie Sails, MD Referring MD:         Glendon Axe (Referring MD) Medicines:            Monitored Anesthesia Care Complications:        No immediate complications. Procedure:            Pre-Anesthesia Assessment:                       - ASA Grade Assessment: III - A patient with severe                        systemic disease.                       After obtaining informed consent, the colonoscope was                        passed under direct vision. Throughout the procedure,                        the patient's blood pressure, pulse, and oxygen                        saturations were monitored continuously. The                        Colonoscope was introduced through the anus and                        advanced to the the ileocolonic anastomosis. The                        colonoscopy was performed with moderate difficulty due                        to a tortuous colon. Successful completion of the                        procedure was aided by using manual pressure. The                        patient tolerated the procedure well. The quality of                        the bowel preparation was fair. Findings:      A 9 mm polyp was found in the proximal sigmoid colon. The polyp was       sessile. The polyp was removed with a cold snare. Resection and       retrieval were complete.  A 5 mm polyp was found in the mid sigmoid colon. The polyp was sessile.       The polyp was removed with a cold snare. Resection and retrieval  were       complete.      A 8 mm polyp was found in the rectum. The polyp was semi-pedunculated.       The polyp was removed with a cold snare. Resection and retrieval were       complete.      A 2 mm polyp was found in the rectum. The polyp was sessile. The polyp       was removed with a cold biopsy forceps. Resection and retrieval were       complete.      The digital rectal exam was normal. Impression:           - Preparation of the colon was fair.                       - One 9 mm polyp in the proximal sigmoid colon, removed                        with a cold snare. Resected and retrieved.                       - One 5 mm polyp in the mid sigmoid colon, removed with                        a cold snare. Resected and retrieved.                       - One 8 mm polyp in the rectum, removed with a cold                        snare. Resected and retrieved.                       - One 2 mm polyp in the rectum, removed with a cold                        biopsy forceps. Resected and retrieved. Recommendation:       - Discharge patient to home.                       - Soft diet today, then advance as tolerated to advance                        diet as tolerated.                       - Telephone GI clinic for pathology results in 1 week. Lollie Sails, MD 12/11/2018 8:15:02 AM This report has been signed electronically. Number of Addenda: 0 Note Initiated On: 12/11/2018 7:28 AM Scope Withdrawal Time: 0 hours 15 minutes 2 seconds  Total Procedure Duration: 0 hours 32 minutes 14 seconds       [facility]

## 2018-12-11 NOTE — Anesthesia Postprocedure Evaluation (Signed)
Anesthesia Post Note  Patient: Pamela [name]  Procedure(s) Performed: COLONOSCOPY WITH PROPOFOL (N/A )  Patient location during evaluation: Endoscopy Anesthesia Type: General Level of consciousness: awake and alert Pain management: pain level controlled Vital Signs Assessment: post-procedure vital signs reviewed and stable Respiratory status: spontaneous breathing and respiratory function stable Cardiovascular status: stable Anesthetic complications: no     Last Vitals:  Vitals:   12/11/18 0659 12/11/18 0812  BP: 117/75 105/67  Pulse: 76 73  Resp: 18 12  Temp: 37.1 C (!) 36.1 C  SpO2: 96% 100%    Last Pain:  Vitals:   12/11/18 0812  TempSrc: Tympanic                 KEPHART,WILLIAM K

## 2018-12-11 NOTE — H&P (Signed)
Outpatient short stay form Pre-procedure 12/11/2018 7:22 AM Pamela Sails MD  Primary Physician: Glendon Axe  Reason for visit: Colonoscopy  History of present illness: Patient is a 58 year old female presenting today for colonoscopy in regards her personal history of colon cancer as well as adenomatous colon polyps.  A right hemicolectomy in 2003 for her colon cancer.  She has had several colonoscopies at [facility] with Dr. Phillip Heal in regards to polyps.  Her last colonoscopy was in January 2017 with the removal of a polyp with high-grade dysplasia at that time.  She was due to go back in 4 months however apparently did not keep that appointment.  He is presenting today for colonoscopy.  Takes no aspirin or blood thinning agent.  He denies any rectal bleeding or abdominal pain.  She does at times have looser stools.  Tolerating her prep well.  Of note she also takes metformin and Mag-Ox    Current Facility-Administered Medications:  .  0.9 %  sodium chloride infusion, , Intravenous, Continuous, Pamela Sails, MD, Last Rate: 20 mL/hr at 12/11/18 1478  Medications Prior to Admission  Medication Sig Dispense Refill Last Dose  . diclofenac sodium (VOLTAREN) 1 % GEL Apply topically 2 (two) times daily.     Marland Kitchen esomeprazole (NEXIUM) 20 MG capsule Take 20 mg by mouth daily at 12 noon.   Past Week at Unknown time  . ferrous sulfate 325 (65 FE) MG EC tablet Take 325 mg by mouth daily.   Past Week at Unknown time  . levothyroxine (SYNTHROID, LEVOTHROID) 50 MCG tablet Take 50 mcg by mouth daily before breakfast.   12/10/2018 at Unknown time  . lisinopril-hydrochlorothiazide (PRINZIDE,ZESTORETIC) 10-12.5 MG tablet Take 1 tablet by mouth daily.   12/11/2018 at Unknown time  . magnesium oxide (MAG-OX) 400 MG tablet Take 500 mg by mouth daily.   12/10/2018 at Unknown time  . metFORMIN (GLUCOPHAGE) 1000 MG tablet Take 1,000 mg by mouth 2 (two) times daily with a meal.   12/10/2018 at Unknown time  . Multiple  Vitamin (MULTIVITAMIN) tablet Take 1 tablet by mouth daily.     . simvastatin (ZOCOR) 40 MG tablet Take 40 mg by mouth daily.   Past Week at Unknown time  . tiZANidine (ZANAFLEX) 2 MG tablet Take by mouth every 8 (eight) hours as needed for muscle spasms.   Past Week at Unknown time     Allergies  Allergen Reactions  . Vioxx [Rofecoxib]      Past Medical History:  Diagnosis Date  . Colon cancer ([place]) 2003  . Diabetes mellitus without complication ([place])   . Hypertension   . Personal history of chemotherapy 2003   colon cancer  . Uterine cancer ([place]) 2007    Review of systems:      Physical Exam    Heart and lungs: Rhythm without rub or gallop, lungs are bilaterally clear    HEENT: Normocephalic atraumatic eyes are anicteric    Other:    Pertinant exam for procedure: Soft nontender nondistended bowel sounds positive normoactive.    Planned proceedures: Colonoscopy and indicated procedures. I have discussed the risks benefits and complications of procedures to include not limited to bleeding, infection, perforation and the risk of sedation and the patient wishes to proceed.    Pamela Sails, MD Gastroenterology 12/11/2018  7:22 AM

## 2018-12-11 NOTE — Anesthesia Post-op Follow-up Note (Signed)
Anesthesia QCDR form completed.        

## 2018-12-11 NOTE — Transfer of Care (Signed)
Immediate Anesthesia Transfer of Care Note  Patient: Aithana D Cuartas  Procedure(s) Performed: Procedure(s): COLONOSCOPY WITH PROPOFOL (N/A)  Patient Location: PACU and Endoscopy Unit  Anesthesia Type:General  Level of Consciousness: sedated  Airway & Oxygen Therapy: Patient Spontanous Breathing and Patient connected to nasal cannula oxygen  Post-op Assessment: Report given to RN and Post -op Vital signs reviewed and stable  Post vital signs: Reviewed and stable  Last Vitals:  Vitals:   12/11/18 0659 12/11/18 0812  BP: 117/75 105/67  Pulse: 76 73  Resp: 18 12  Temp: 37.1 C (!) 36.1 C  SpO2: 10% 034%    Complications: No apparent anesthesia complications

## 2018-12-14 LAB — SURGICAL PATHOLOGY

## 2019-03-02 ENCOUNTER — Other Ambulatory Visit: Payer: Self-pay

## 2019-03-02 ENCOUNTER — Ambulatory Visit
Admission: RE | Admit: 2019-03-02 | Discharge: 2019-03-02 | Disposition: A | Discharge status: Home / Self Care | Payer: BC Managed Care – PPO | Source: Ambulatory Visit | Attending: Internal Medicine | Admitting: Internal Medicine

## 2019-03-02 DIAGNOSIS — Z1231 Encounter for screening mammogram for malignant neoplasm of breast: Secondary | ICD-10-CM | POA: Diagnosis present

## 2019-03-02 IMAGING — MG DIGITAL SCREENING BILATERAL MAMMOGRAM WITH TOMO AND CAD
8 series · 8 of 24 positions shown · non-contrast
Comparison: Previous exam(s).

CLINICAL DATA: Screening.

EXAM:
DIGITAL SCREENING BILATERAL MAMMOGRAM WITH TOMO AND CAD

[R CC synth-2D]
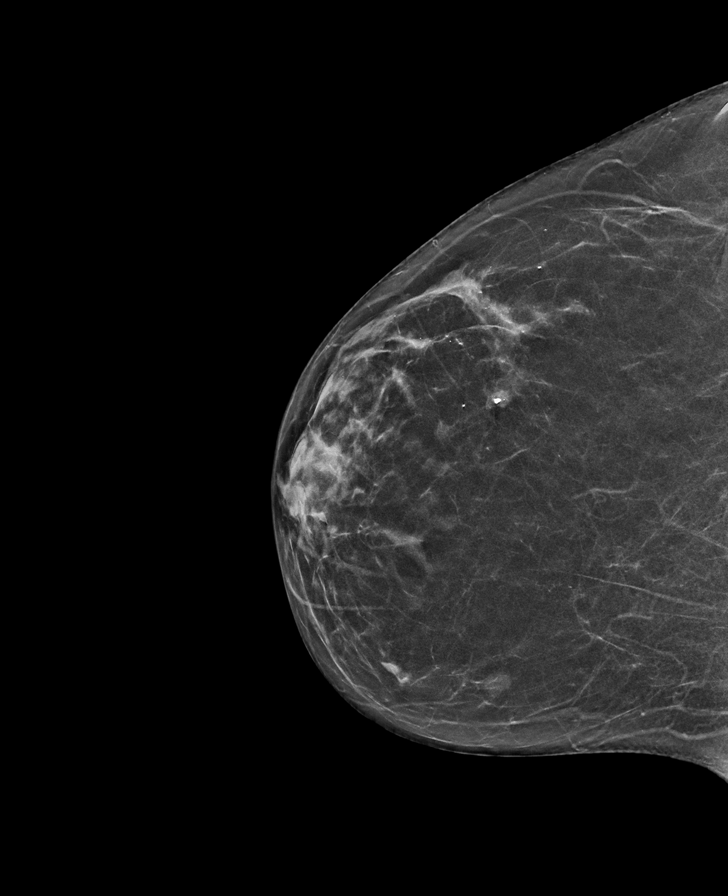

[R MLO synth-2D]
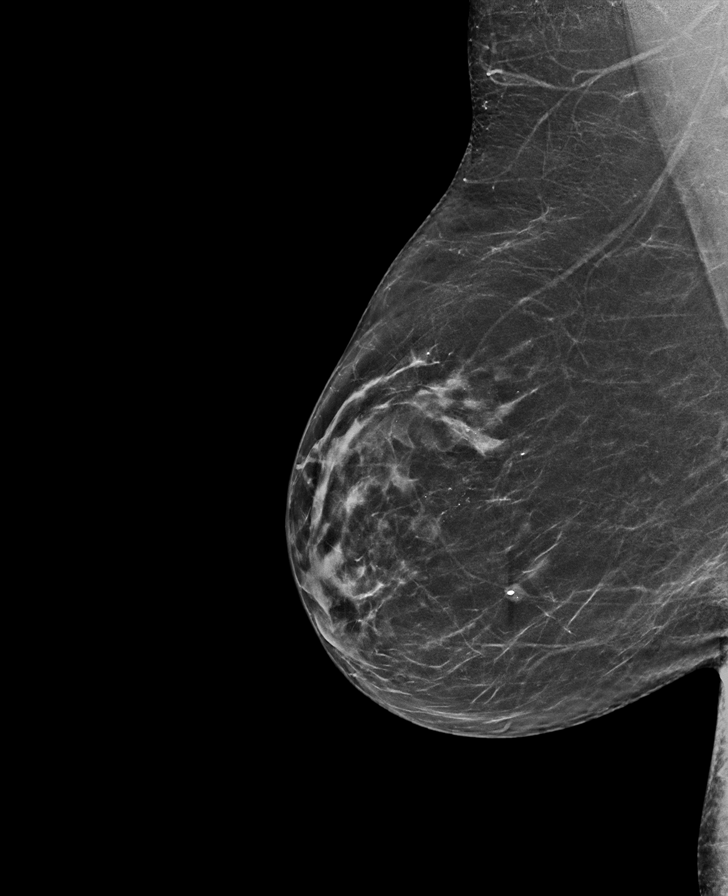

[L CC synth-2D]
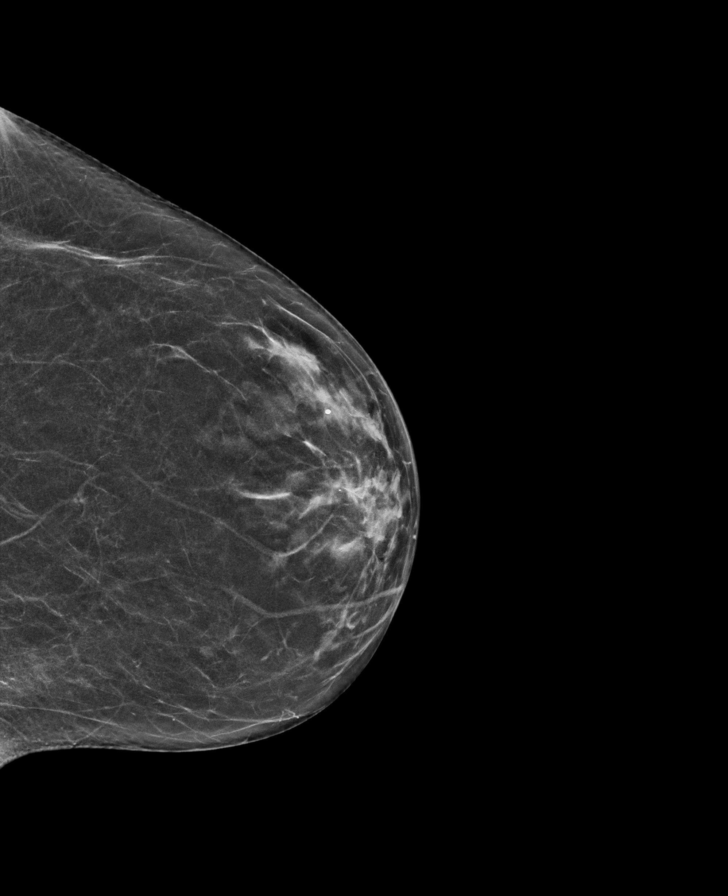

[L MLO synth-2D]
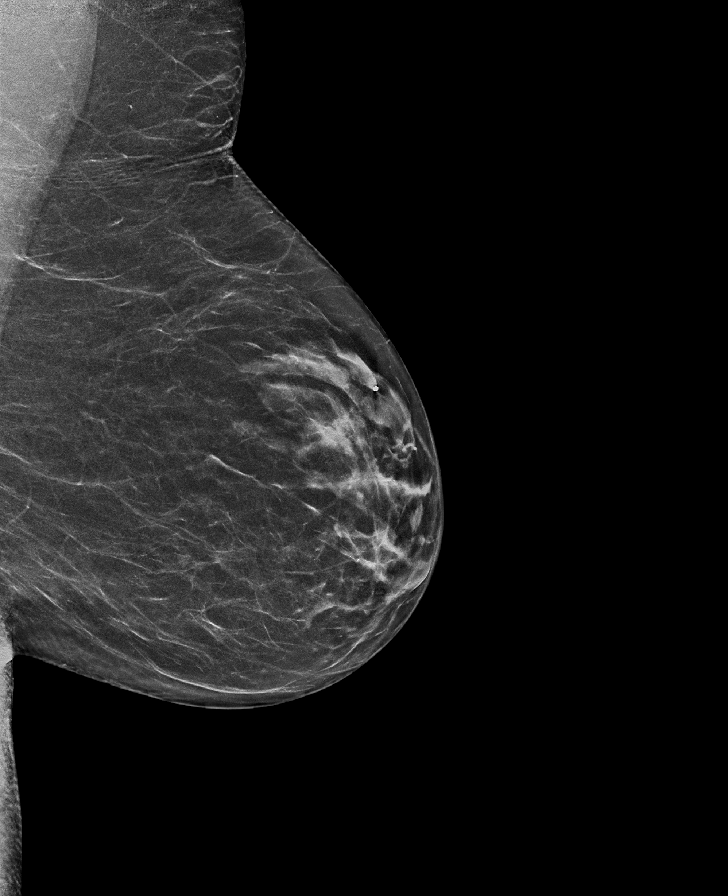

[L CC tomo · tomo slice 27/54.0]
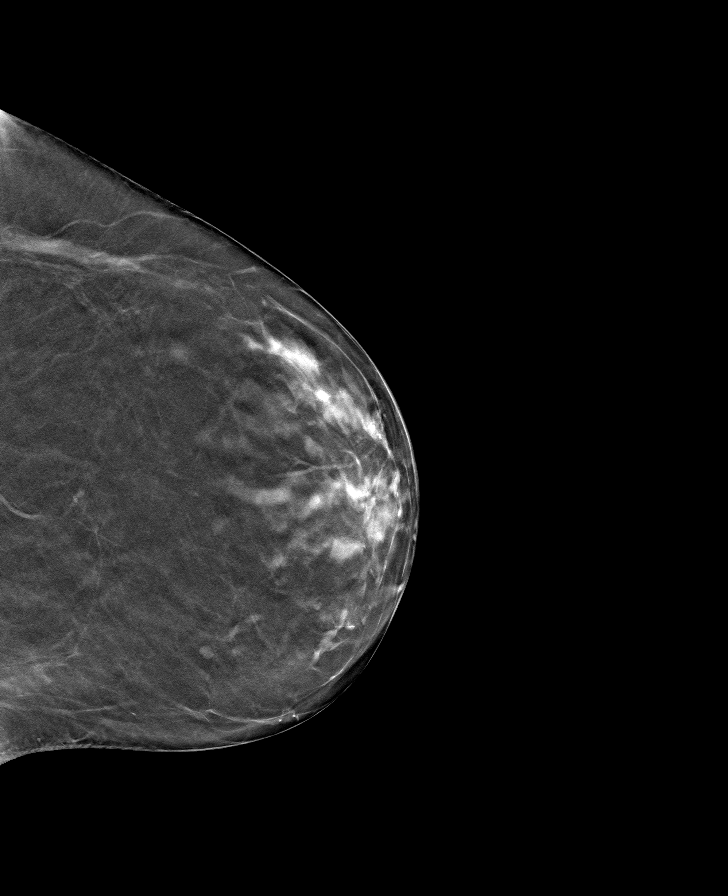

[R MLO tomo · tomo slice 33/66.0]
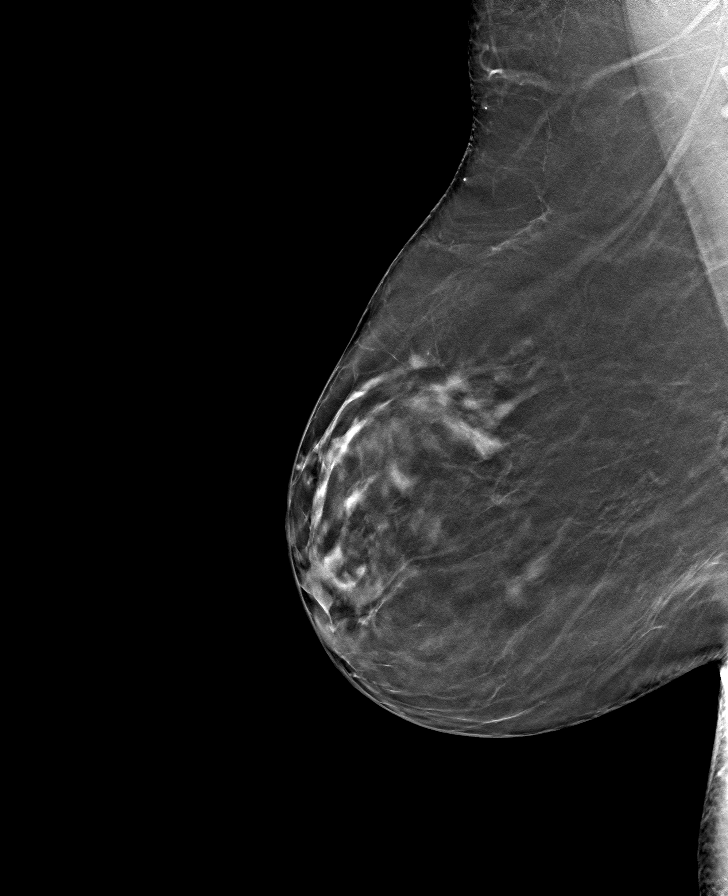

[R CC tomo · tomo slice 29/56.0]
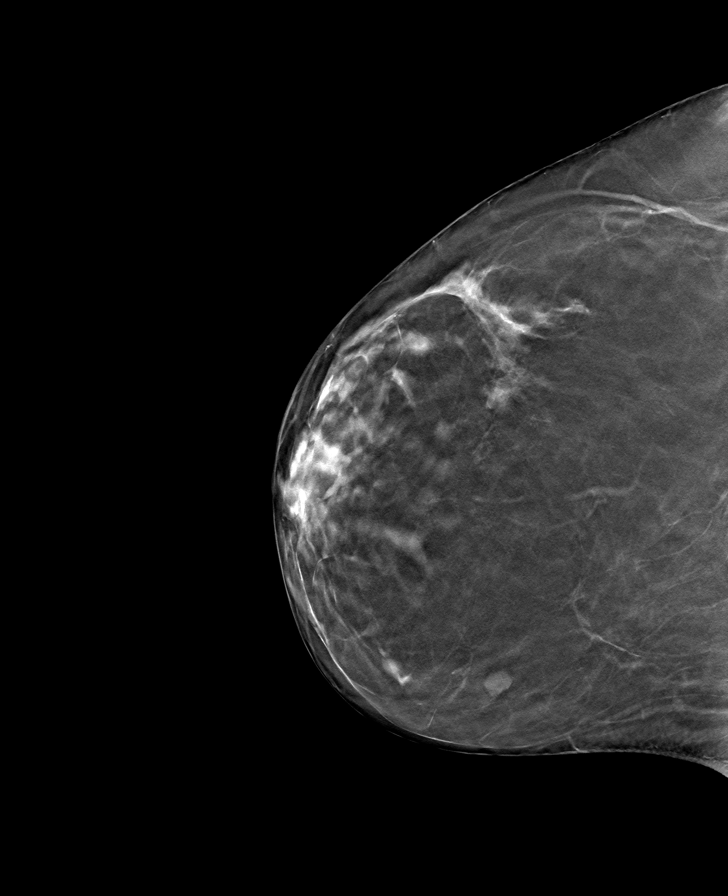

[L MLO tomo · tomo slice 31/61.0]
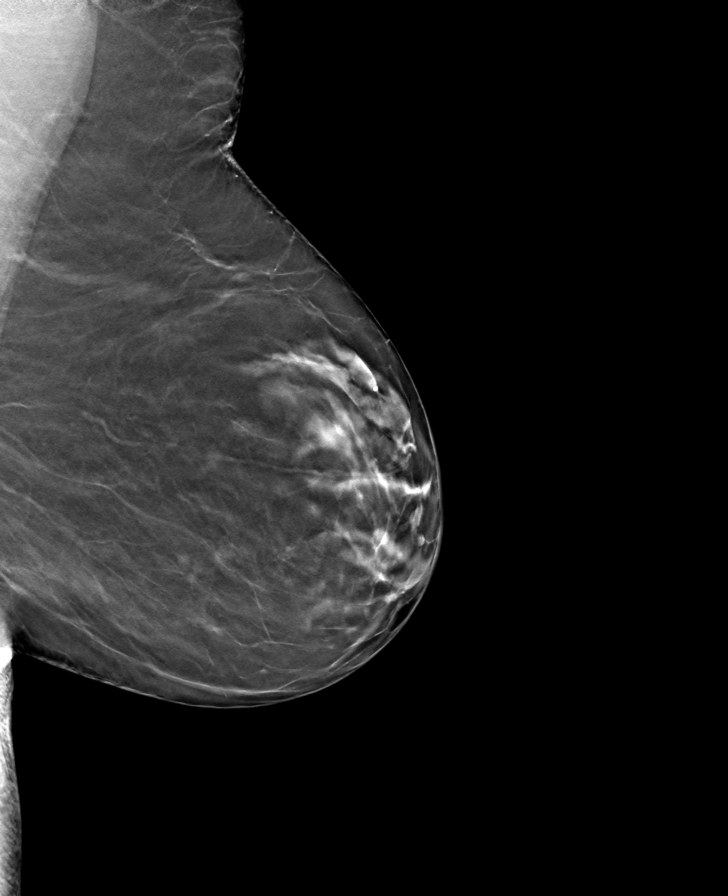

[8 of 24 positions shown; findings below may reference images not displayed]

ACR Breast Density Category b: There are scattered areas of
fibroglandular density.
FINDINGS: There are no findings suspicious for malignancy. Images were
processed with CAD.
IMPRESSION: No mammographic evidence of malignancy. A result letter of this
screening mammogram will be mailed directly to the patient.

RECOMMENDATION:
Screening mammogram in one year. (Code:[TQ])

BI-RADS CATEGORY  1: Negative.

## 2020-01-10 ENCOUNTER — Other Ambulatory Visit: Payer: Self-pay | Admitting: Physician Assistant

## 2020-01-10 DIAGNOSIS — Z1231 Encounter for screening mammogram for malignant neoplasm of breast: Secondary | ICD-10-CM

## 2020-01-13 ENCOUNTER — Telehealth: Payer: Self-pay | Admitting: *Deleted

## 2020-01-13 DIAGNOSIS — Z122 Encounter for screening for malignant neoplasm of respiratory organs: Secondary | ICD-10-CM

## 2020-01-13 DIAGNOSIS — Z87891 Personal history of nicotine dependence: Secondary | ICD-10-CM

## 2020-01-13 NOTE — Telephone Encounter (Signed)
Received referral for initial lung cancer screening scan. Contacted patient and obtained smoking history,(current, 43 pack year) as well as answering questions related to screening process. Patient denies signs of lung cancer such as weight loss or hemoptysis. Patient denies comorbidity that would prevent curative treatment if lung cancer were found. Patient is scheduled for shared decision making visit and CT scan on 01/19/20 at 10am.

## 2020-01-19 ENCOUNTER — Inpatient Hospital Stay: Payer: BC Managed Care – PPO | Attending: Oncology | Admitting: Oncology

## 2020-01-19 ENCOUNTER — Ambulatory Visit
Admission: RE | Admit: 2020-01-19 | Discharge: 2020-01-19 | Disposition: A | Discharge status: Home / Self Care | Payer: BC Managed Care – PPO | Source: Ambulatory Visit | Attending: Oncology | Admitting: Oncology

## 2020-01-19 ENCOUNTER — Encounter: Payer: Self-pay | Admitting: Oncology

## 2020-01-19 ENCOUNTER — Other Ambulatory Visit: Payer: Self-pay

## 2020-01-19 DIAGNOSIS — Z122 Encounter for screening for malignant neoplasm of respiratory organs: Secondary | ICD-10-CM | POA: Insufficient documentation

## 2020-01-19 DIAGNOSIS — F17210 Nicotine dependence, cigarettes, uncomplicated: Secondary | ICD-10-CM

## 2020-01-19 DIAGNOSIS — Z87891 Personal history of nicotine dependence: Secondary | ICD-10-CM

## 2020-01-19 IMAGING — CT CT CHEST LUNG CANCER SCREENING LOW DOSE W/O CM
2 of 5 series · 14 of 40 positions shown, 17 images · non-contrast
Comparison: No priors.

CLINICAL DATA: 58-year-old female current smoker with 43 pack-year
history of smoking. Lung cancer screening examination.

EXAM:
CT CHEST WITHOUT CONTRAST LOW-DOSE FOR LUNG CANCER SCREENING
TECHNIQUE: Multidetector CT imaging of the chest was performed following the
standard protocol without IV contrast.

[Series 4: lung 1.00 · axial · 0.76mm/px · z∈[-1188,-884]mm · 11 of 336 slices shown, 14 images]
[im 16/336  mediastinal]
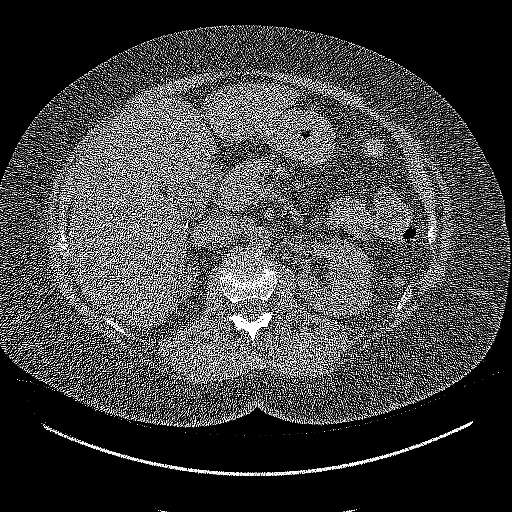
[im 16/336  lung]
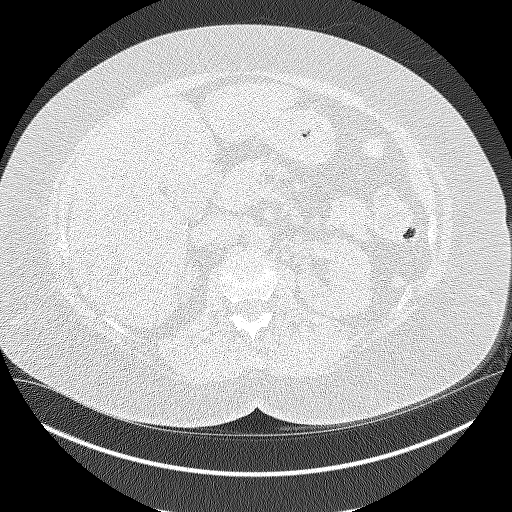
[im 46/336  lung]
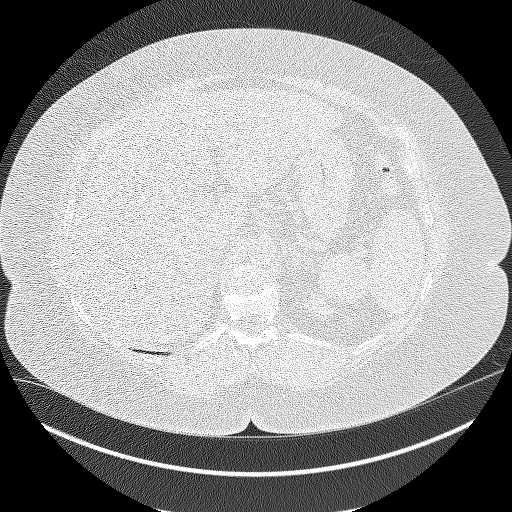
[im 77/336  lung]
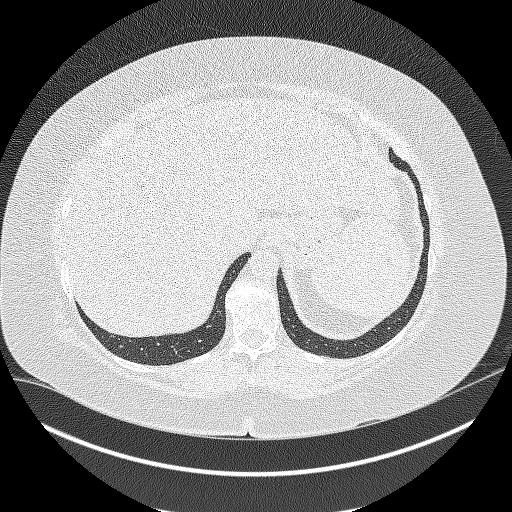
[im 107/336  lung]
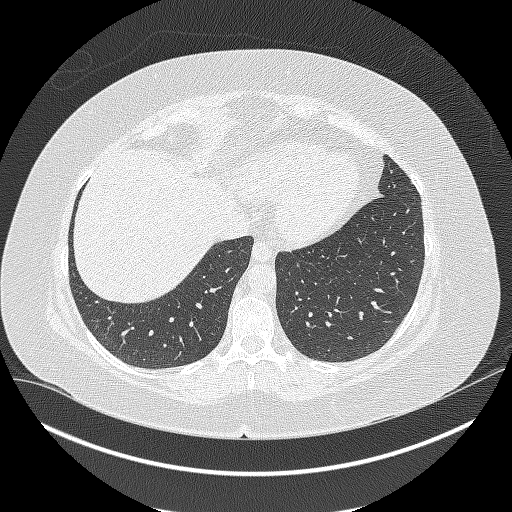
[im 138/336  mediastinal]
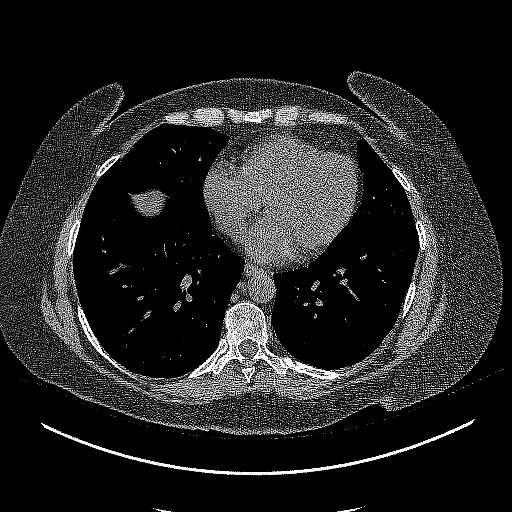
[im 138/336  lung]
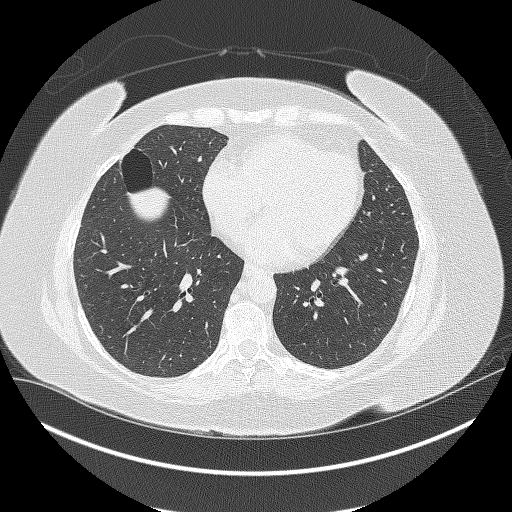
[im 168/336  lung]
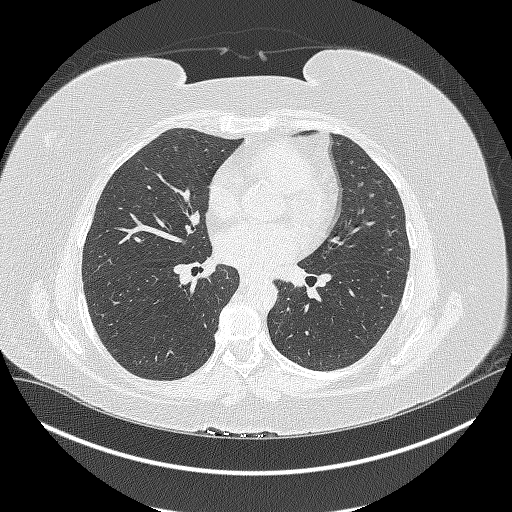
[im 198/336  lung]
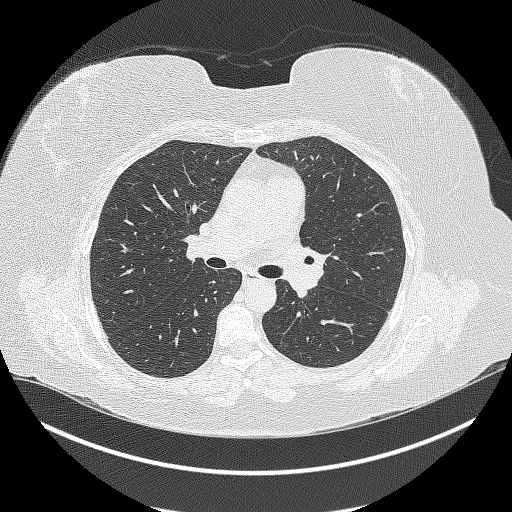
[im 229/336  lung]
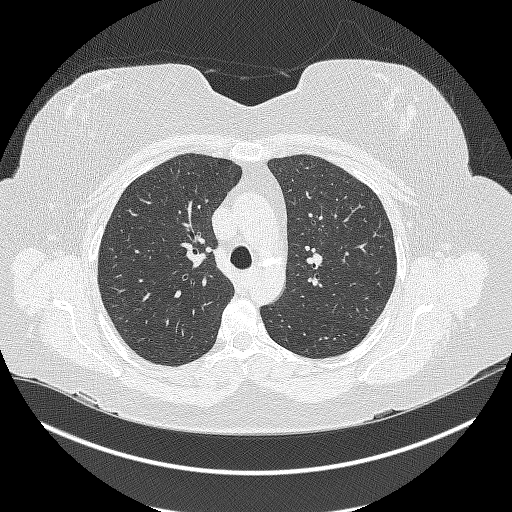
[im 259/336  mediastinal]
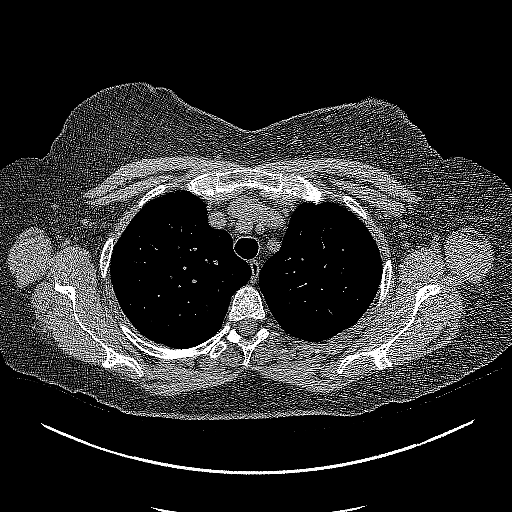
[im 259/336  lung]
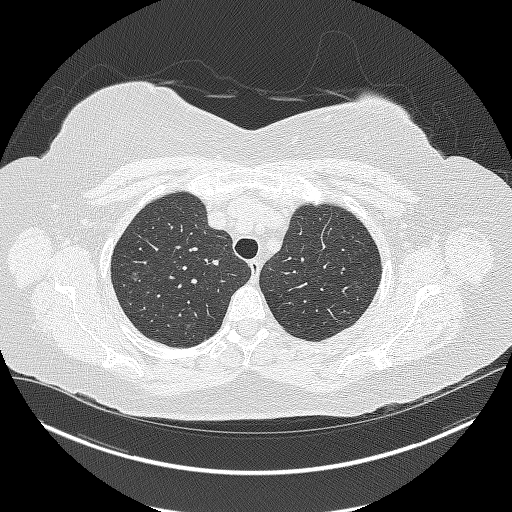
[im 290/336  lung]
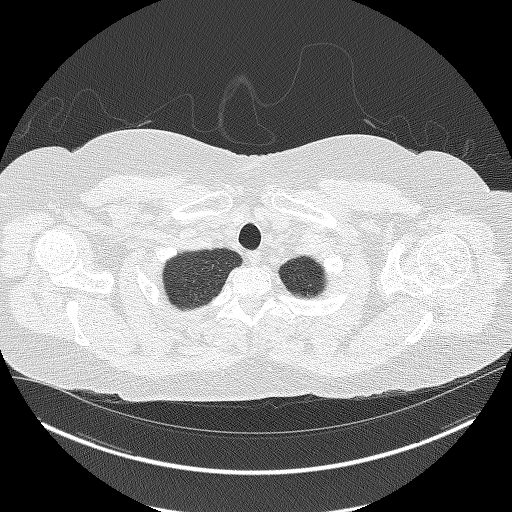
[im 320/336  lung]
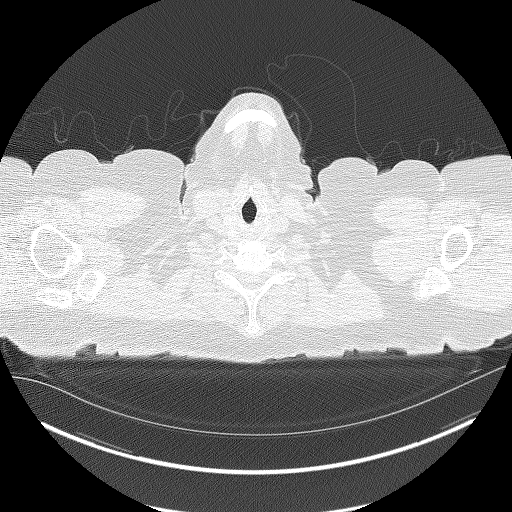

[Series 5: coronals lung 1.00 cor · coronal · 0.66mm/px · 3 of 342 slices shown]
[im 69/342  lung]
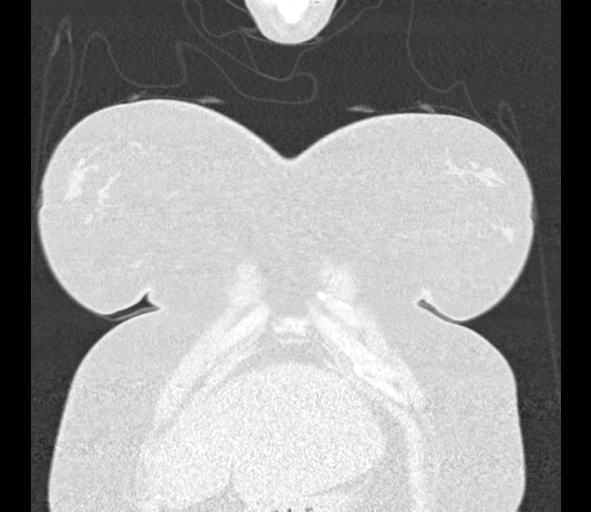
[im 137/342  lung]
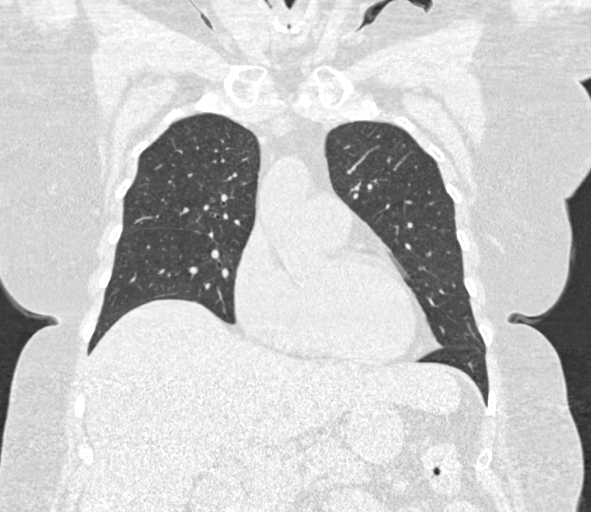
[im 205/342  lung]
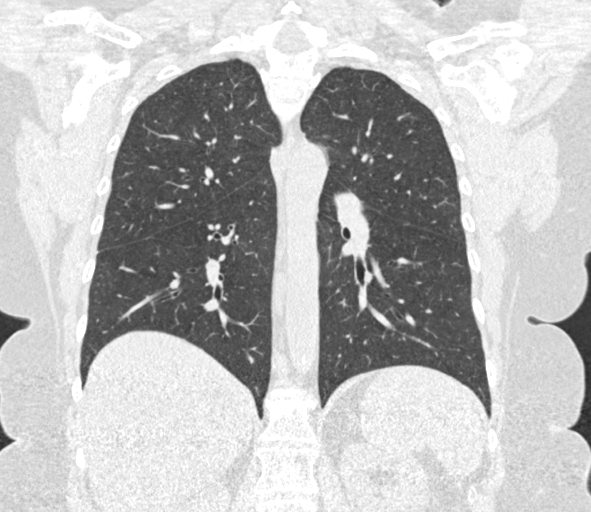

[14 of 40 positions shown; findings below may reference images not displayed]

FINDINGS: Cardiovascular: Heart size is normal. There is no significant
pericardial fluid, thickening or pericardial calcification. There is
aortic atherosclerosis, as well as atherosclerosis of the great
vessels of the mediastinum and the coronary arteries, including
calcified atherosclerotic plaque in the left main, left anterior
descending, left circumflex and right coronary arteries.

Mediastinum/Nodes: No pathologically enlarged mediastinal or hilar
lymph nodes. Please note that accurate exclusion of hilar adenopathy
is limited on noncontrast CT scans. Esophagus is unremarkable in
appearance. No axillary lymphadenopathy.

Lungs/Pleura: Multiple small pulmonary nodules are noted throughout
the lungs bilaterally, largest of which is in the right middle lobe
(axial image 167 of series 3), with a volume derived mean diameter
4.0 mm. No larger more suspicious appearing pulmonary nodules or
masses are noted. No acute consolidative airspace disease. No
pleural effusions. Diffuse bronchial wall thickening with mild
centrilobular and paraseptal emphysema.

Upper Abdomen: Diffuse low attenuation throughout the visualized
hepatic parenchyma, indicative of hepatic steatosis. Status post
cholecystectomy. Aortic atherosclerosis.

Musculoskeletal: There are no aggressive appearing lytic or blastic
lesions noted in the visualized portions of the skeleton.
IMPRESSION: 1. Lung-RADS 2S, benign appearance or behavior. Continue annual
screening with low-dose chest CT without contrast in 12 months.
2. The "S" modifier above refers to potentially clinically
significant non lung cancer related findings. Specifically, there is
aortic atherosclerosis, in addition to left main and 3 vessel
coronary artery disease. Please note that although the presence of
coronary artery calcium documents the presence of coronary artery
disease, the severity of this disease and any potential stenosis
cannot be assessed on this non-gated CT examination. Assessment for
potential risk factor modification, dietary therapy or pharmacologic
therapy may be warranted, if clinically indicated.
3. Mild diffuse bronchial wall thickening with mild centrilobular
and paraseptal emphysema; imaging findings suggestive of underlying
COPD.
4. Hepatic steatosis.

Aortic Atherosclerosis ([IK]-[IK]) and Emphysema ([IK]-[IK]).

## 2020-01-19 NOTE — Progress Notes (Signed)
Virtual Visit via Video Note  I connected with Pamela [name] on 01/19/20 at 10:00 AM EDT by a video enabled telemedicine application and verified that I am speaking with the correct person using two identifiers.  Location: Patient: [facility] Provider: Office   I discussed the limitations of evaluation and management by telemedicine and the availability of in person appointments. The patient expressed understanding and agreed to proceed.  I discussed the assessment and treatment plan with the patient. The patient was provided an opportunity to ask questions and all were answered. The patient agreed with the plan and demonstrated an understanding of the instructions.   The patient was advised to call back or seek an in-person evaluation if the symptoms worsen or if the condition fails to improve as anticipated.   In accordance with CMS guidelines, patient has met eligibility criteria including age, absence of signs or symptoms of lung cancer.  Social History   Tobacco Use  . Smoking status: Current Every Day Smoker    Packs/day: 1.00    Years: 43.00    Pack years: 43.00    Types: Cigarettes  . Smokeless tobacco: Never Used  . Tobacco comment: 1 to 2 cigarettes per day now  Substance Use Topics  . Alcohol use: Not Currently  . Drug use: Never      A shared decision-making session was conducted prior to the performance of CT scan. This includes one or more decision aids, includes benefits and harms of screening, follow-up diagnostic testing, over-diagnosis, false positive rate, and total radiation exposure.   Counseling on the importance of adherence to annual lung cancer LDCT screening, impact of co-morbidities, and ability or willingness to undergo diagnosis and treatment is imperative for compliance of the program.   Counseling on the importance of continued smoking cessation for former smokers; the importance of smoking cessation for current smokers, and information about tobacco cessation  interventions have been given to patient including [place] and [employer] quit [place] programs.   Written order for lung cancer screening with LDCT has been given to the patient and any and all questions have been answered to the best of my abilities.    Yearly follow up will be coordinated by Burgess Estelle, Thoracic Navigator.  I provided 15 minutes of face-to-face video visit time during this encounter, and > 50% was spent counseling as documented under my assessment & plan.   Jacquelin Hawking, NP

## 2020-01-20 ENCOUNTER — Encounter: Payer: Self-pay | Admitting: *Deleted

## 2020-03-02 ENCOUNTER — Ambulatory Visit
Admission: RE | Admit: 2020-03-02 | Discharge: 2020-03-02 | Disposition: A | Discharge status: Home / Self Care | Payer: BC Managed Care – PPO | Source: Ambulatory Visit | Attending: Physician Assistant | Admitting: Physician Assistant

## 2020-03-02 ENCOUNTER — Other Ambulatory Visit: Payer: Self-pay

## 2020-03-02 DIAGNOSIS — Z1231 Encounter for screening mammogram for malignant neoplasm of breast: Secondary | ICD-10-CM | POA: Insufficient documentation

## 2020-03-02 IMAGING — MG DIGITAL SCREENING BILAT W/ TOMO W/ CAD
8 series · 8 of 24 positions shown · non-contrast
Comparison: Previous exam(s).

CLINICAL DATA: Screening.

EXAM:
DIGITAL SCREENING BILATERAL MAMMOGRAM WITH TOMO AND CAD

[R MLO synth-2D]
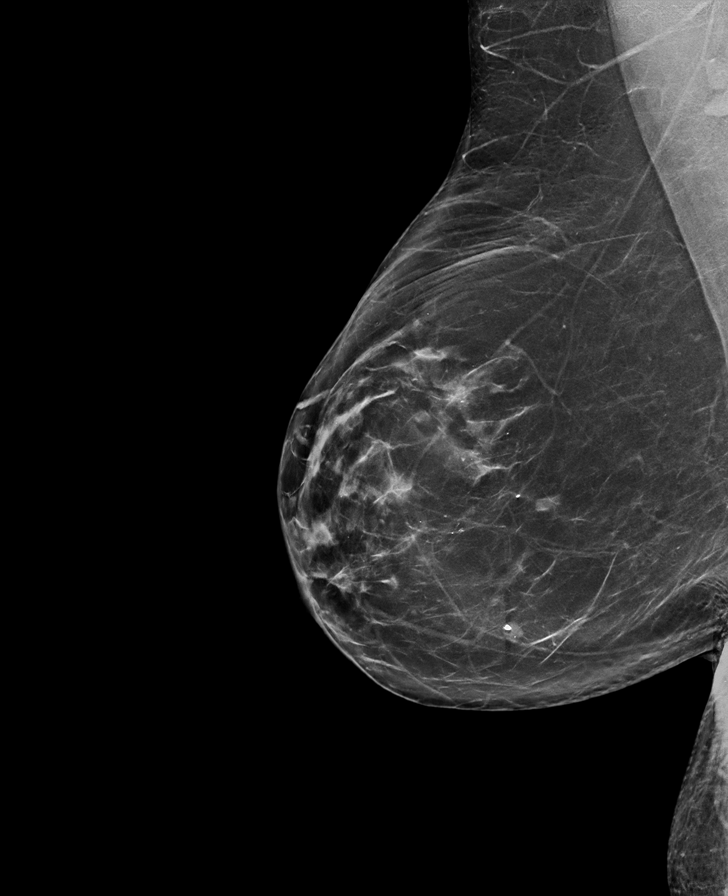

[L MLO synth-2D]
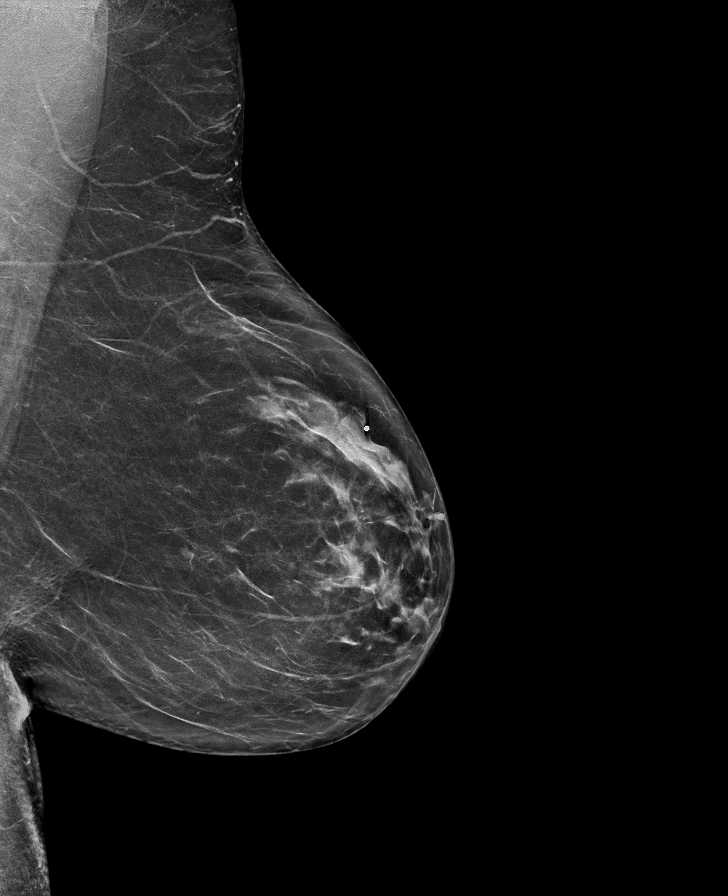

[R CC synth-2D]
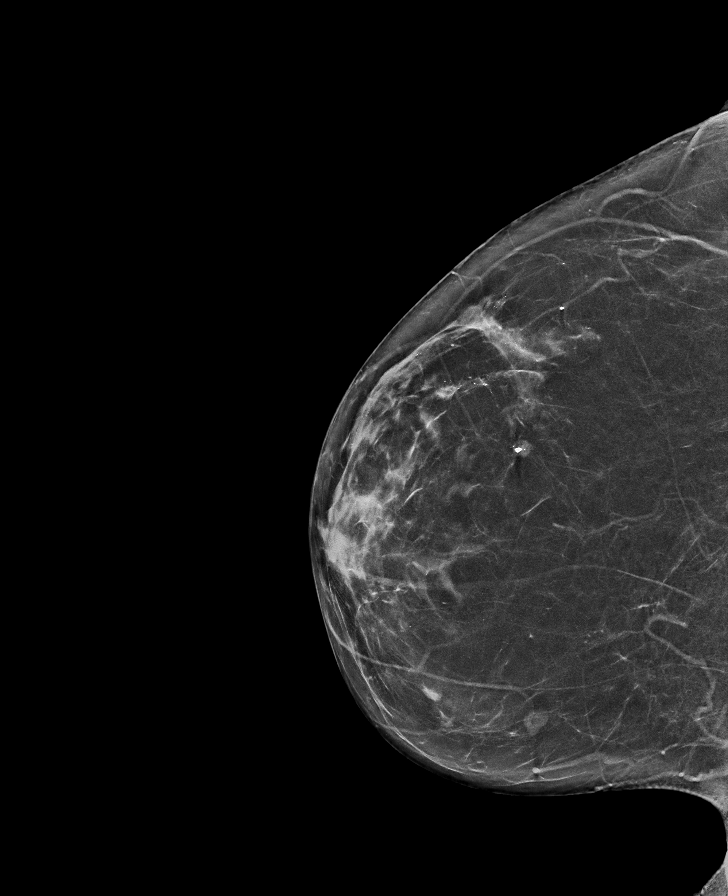

[L CC synth-2D]
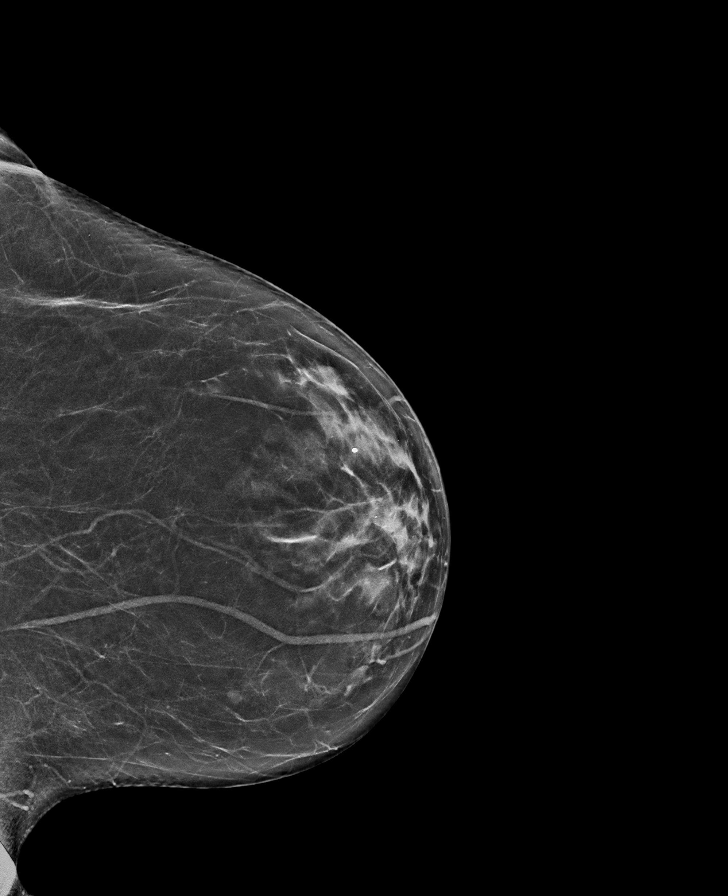

[L CC tomo · tomo slice 33/65.0]
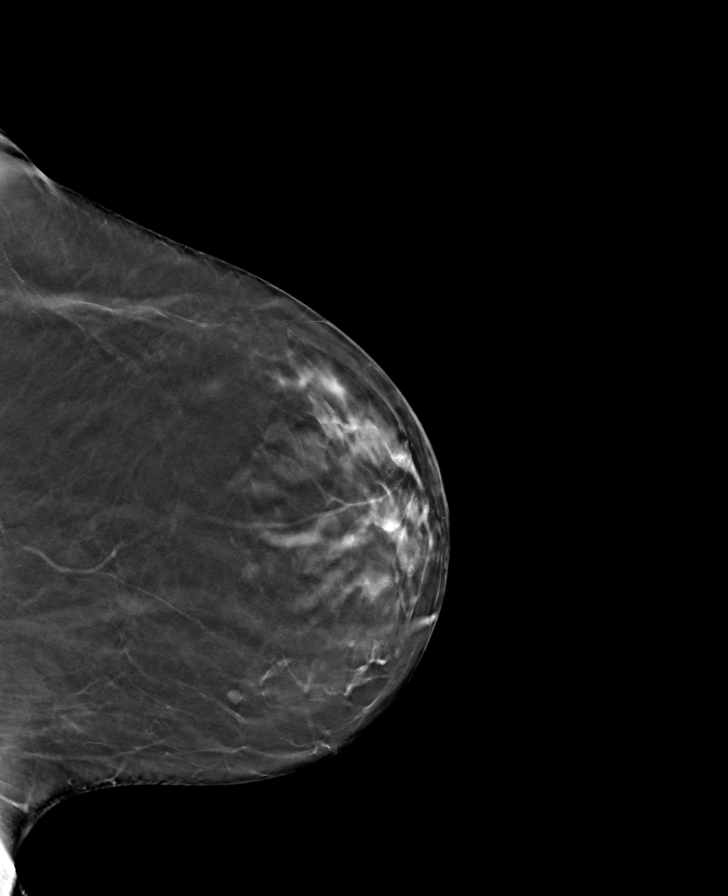

[R MLO tomo · tomo slice 41/80.0]
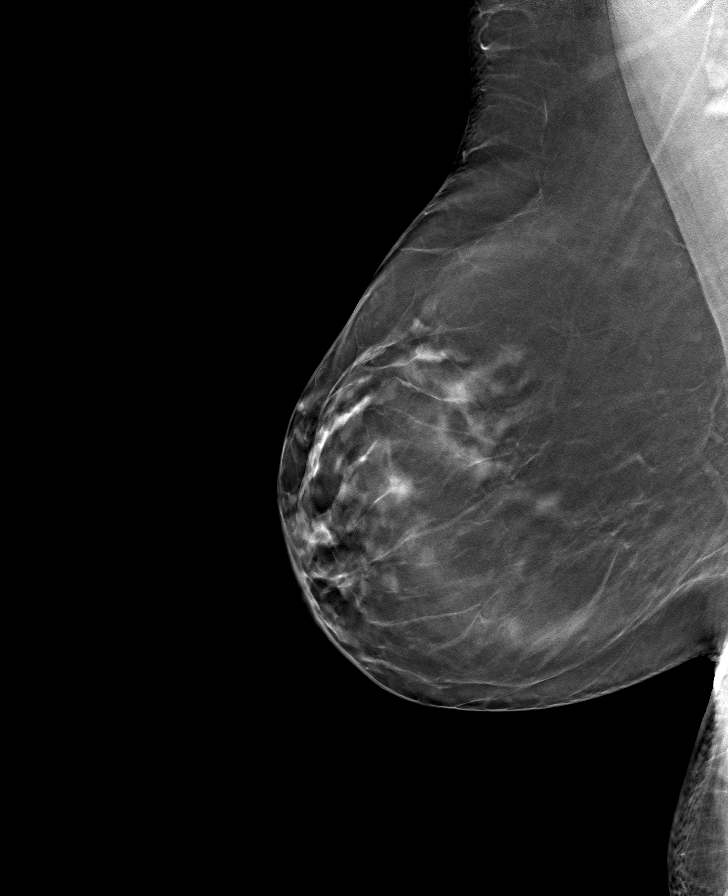

[L MLO tomo · tomo slice 38/75.0]
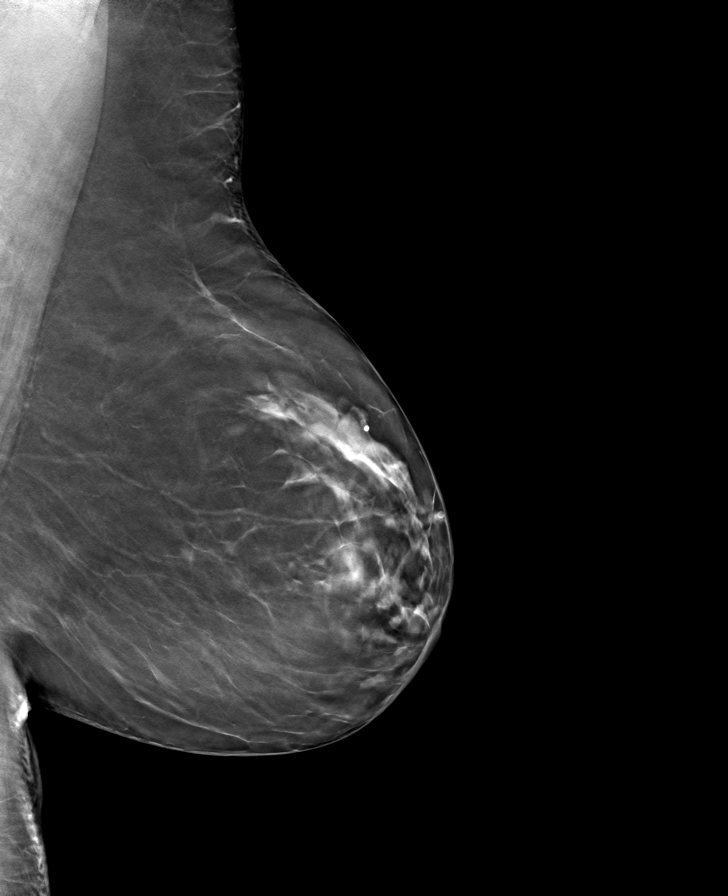

[R CC tomo · tomo slice 35/68.0]
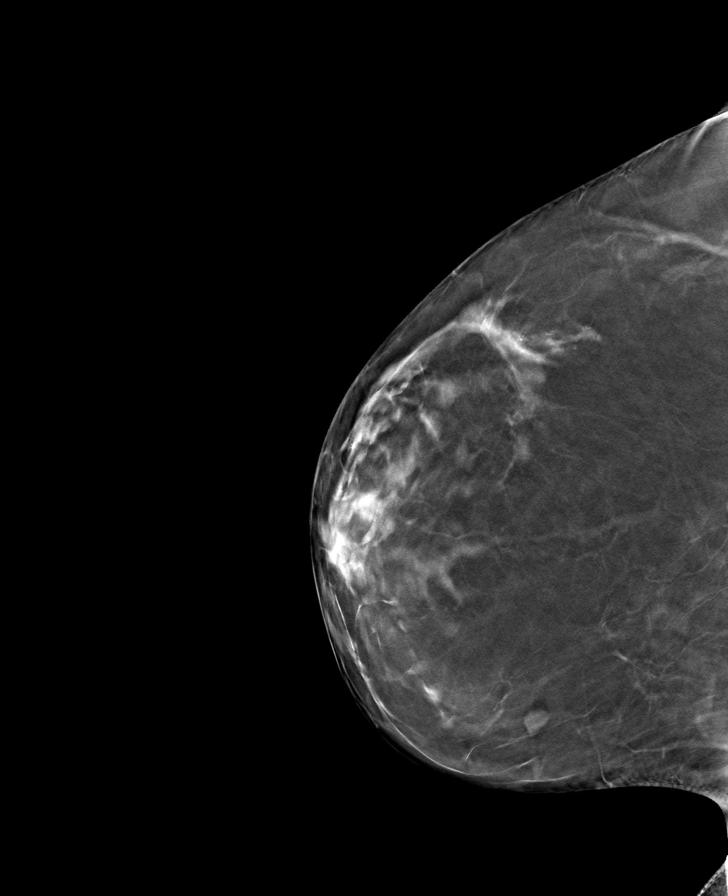

[8 of 24 positions shown; findings below may reference images not displayed]

ACR Breast Density Category c: The breast tissue is heterogeneously
dense, which may obscure small masses.
FINDINGS: There are no findings suspicious for malignancy. Images were
processed with CAD.
IMPRESSION: No mammographic evidence of malignancy. A result letter of this
screening mammogram will be mailed directly to the patient.

RECOMMENDATION:
Screening mammogram in one year. (Code:[5V])

BI-RADS CATEGORY  1: Negative.

## 2021-01-03 ENCOUNTER — Telehealth: Payer: Self-pay | Admitting: *Deleted

## 2021-01-03 NOTE — Telephone Encounter (Signed)
Spoke to patient via telephone re: scheduling her annual lung screening scan. She stated that she was not at home and would have to call me back.

## 2021-01-09 ENCOUNTER — Telehealth: Payer: Self-pay | Admitting: *Deleted

## 2021-01-09 NOTE — Telephone Encounter (Signed)
Attempted to reach patient via telephone re: scheduling annual lung screening scan. Left voicemail message for patient to call me back. 

## 2021-01-11 ENCOUNTER — Telehealth: Payer: Self-pay | Admitting: *Deleted

## 2021-01-11 NOTE — Telephone Encounter (Signed)
Attempted to reach patient via telephone re: scheduling annual lung screening scan. Left voicemail message for patient to call me back. 

## 2021-01-12 ENCOUNTER — Encounter: Payer: Self-pay | Admitting: *Deleted

## 2021-01-22 ENCOUNTER — Other Ambulatory Visit: Payer: Self-pay | Admitting: Physician Assistant

## 2021-01-22 DIAGNOSIS — Z1231 Encounter for screening mammogram for malignant neoplasm of breast: Secondary | ICD-10-CM

## 2021-02-09 ENCOUNTER — Other Ambulatory Visit: Payer: Self-pay

## 2021-02-09 ENCOUNTER — Inpatient Hospital Stay: Payer: BC Managed Care – PPO

## 2021-02-09 ENCOUNTER — Encounter: Payer: Self-pay | Admitting: Oncology

## 2021-02-09 ENCOUNTER — Inpatient Hospital Stay: Payer: BC Managed Care – PPO | Attending: Oncology | Admitting: Oncology

## 2021-02-09 VITALS — BP 122/53 | HR 72 | Temp 98.3°F | Wt 218.5 lb

## 2021-02-09 DIAGNOSIS — D649 Anemia, unspecified: Secondary | ICD-10-CM | POA: Diagnosis not present

## 2021-02-09 DIAGNOSIS — I10 Essential (primary) hypertension: Secondary | ICD-10-CM | POA: Diagnosis not present

## 2021-02-09 DIAGNOSIS — Z85038 Personal history of other malignant neoplasm of large intestine: Secondary | ICD-10-CM | POA: Diagnosis not present

## 2021-02-09 DIAGNOSIS — E119 Type 2 diabetes mellitus without complications: Secondary | ICD-10-CM | POA: Diagnosis not present

## 2021-02-09 DIAGNOSIS — Z803 Family history of malignant neoplasm of breast: Secondary | ICD-10-CM | POA: Insufficient documentation

## 2021-02-09 DIAGNOSIS — Z9049 Acquired absence of other specified parts of digestive tract: Secondary | ICD-10-CM | POA: Diagnosis not present

## 2021-02-09 DIAGNOSIS — Z791 Long term (current) use of non-steroidal anti-inflammatories (NSAID): Secondary | ICD-10-CM | POA: Diagnosis not present

## 2021-02-09 DIAGNOSIS — Z7984 Long term (current) use of oral hypoglycemic drugs: Secondary | ICD-10-CM | POA: Diagnosis not present

## 2021-02-09 DIAGNOSIS — D72828 Other elevated white blood cell count: Secondary | ICD-10-CM

## 2021-02-09 DIAGNOSIS — Z9221 Personal history of antineoplastic chemotherapy: Secondary | ICD-10-CM | POA: Diagnosis not present

## 2021-02-09 DIAGNOSIS — Z79899 Other long term (current) drug therapy: Secondary | ICD-10-CM | POA: Diagnosis not present

## 2021-02-09 DIAGNOSIS — D72829 Elevated white blood cell count, unspecified: Secondary | ICD-10-CM | POA: Insufficient documentation

## 2021-02-09 DIAGNOSIS — F17210 Nicotine dependence, cigarettes, uncomplicated: Secondary | ICD-10-CM | POA: Diagnosis not present

## 2021-02-09 DIAGNOSIS — Z8542 Personal history of malignant neoplasm of other parts of uterus: Secondary | ICD-10-CM | POA: Diagnosis not present

## 2021-02-10 ENCOUNTER — Encounter: Payer: Self-pay | Admitting: Oncology

## 2021-02-10 NOTE — Progress Notes (Signed)
Hematology/Oncology Consult note [facility] [phone] Fax:([phone]  Patient Care Team: Marinda Elk, MD as PCP - General (Physician Assistant)   Name of the patient: Pamela [name]  [id number]  [birth year]    Reason for referral- leucocytosis   Referring physician-Dr. Carrie Mew  Date of visit: 02/10/21   History of presenting illness- Patient is a 60 year old female with a past medical history significant for hypertension and diabetes.  She also has a prior history of colon cancer back in 2003 that was treated in [facility] and she is s/p adjuvant chemotherapy her last surveillance colonoscopy was in 2020 per patient.  She has been referred to [name] for leukocytosis.  Her most recent labs from 01/29/2021 showed a white count of 12.6, H&H of 11.2/36.3 with a platelet count of 303.  Of note her white count has been ranging between 10-12 only since this month.  Prior to that in October 2021 her white count was normal between 7-10.  Patient has mild baseline anemia but her hemoglobin is remained stable between 11-11.5 since 2019.  Differential on her CBC mainly showed neutrophilia CMP was within normal limits.  Patient reports doing well overall and denies any specific complaints at this time.  Appetite and weight have remained stable.  Denies any recent infections or hospitalizations  ECOG PS- 0  Pain scale- 0   Review of systems- Review of Systems  Constitutional: Negative for chills, fever, malaise/fatigue and weight loss.  HENT: Negative for congestion, ear discharge and nosebleeds.   Eyes: Negative for blurred vision.  Respiratory: Negative for cough, hemoptysis, sputum production, shortness of breath and wheezing.   Cardiovascular: Negative for chest pain, palpitations, orthopnea and claudication.  Gastrointestinal: Negative for abdominal pain, blood in stool, constipation, diarrhea, heartburn, melena, nausea and vomiting.  Genitourinary:  Negative for dysuria, flank pain, frequency, hematuria and urgency.  Musculoskeletal: Negative for back pain, joint pain and myalgias.  Skin: Negative for rash.  Neurological: Negative for dizziness, tingling, focal weakness, seizures, weakness and headaches.  Endo/Heme/Allergies: Does not bruise/bleed easily.  Psychiatric/Behavioral: Negative for depression and suicidal ideas. The patient does not have insomnia.     Allergies  Allergen Reactions  . Rofecoxib Swelling    There are no problems to display for this patient.    Past Medical History:  Diagnosis Date  . Colon cancer ([place]) 2003  . Diabetes mellitus without complication ([place])   . Hypertension   . Personal history of chemotherapy 2003   colon cancer  . Uterine cancer ([place]) 2007     Past Surgical History:  Procedure Laterality Date  . ABDOMINAL HYSTERECTOMY    . BREAST EXCISIONAL BIOPSY Right 11/26/2010   neg/- PROLIFERATIVE FIBROCYSTIC CHANGE WITH ASSOCIATED   . CHOLECYSTECTOMY    . COLON SURGERY     2003  . COLONOSCOPY    . COLONOSCOPY WITH PROPOFOL N/A 12/11/2018   Procedure: COLONOSCOPY WITH PROPOFOL;  Surgeon: Lollie Sails, MD;  Location: [facility] ENDOSCOPY;  Service: Endoscopy;  Laterality: N/A;    Social History   Socioeconomic History  . Marital status: Married    Spouse name: Not on file  . Number of children: Not on file  . Years of education: Not on file  . Highest education level: Not on file  Occupational History  . Not on file  Tobacco Use  . Smoking status: Current Every Day Smoker    Packs/day: 1.00    Years: 43.00    Pack years: 43.00  Types: Cigarettes  . Smokeless tobacco: Never Used  . Tobacco comment: 1 to 2 cigarettes per day now  Vaping Use  . Vaping Use: Never used  Substance and Sexual Activity  . Alcohol use: Not Currently  . Drug use: Never  . Sexual activity: Not on file  Other Topics Concern  . Not on file  Social History Narrative  . Not on file   Social  Determinants of Health   Financial Resource Strain: Not on file  Food Insecurity: Not on file  Transportation Needs: Not on file  Physical Activity: Not on file  Stress: Not on file  Social Connections: Not on file  Intimate Partner Violence: Not on file     Family History  Problem Relation Age of Onset  . Breast cancer Maternal Aunt 60  . Breast cancer Paternal Aunt 46  . Breast cancer Cousin 18  . Breast cancer Cousin 32     Current Outpatient Medications:  .  esomeprazole (NEXIUM) 20 MG capsule, Take 20 mg by mouth daily at 12 noon., Disp: , Rfl:  .  ferrous sulfate 325 (65 FE) MG EC tablet, Take 325 mg by mouth daily., Disp: , Rfl:  .  levothyroxine (SYNTHROID, LEVOTHROID) 50 MCG tablet, Take 50 mcg by mouth daily before breakfast., Disp: , Rfl:  .  lisinopril-hydrochlorothiazide (PRINZIDE,ZESTORETIC) 10-12.5 MG tablet, Take 1 tablet by mouth daily., Disp: , Rfl:  .  magnesium oxide (MAG-OX) 400 MG tablet, Take 500 mg by mouth daily., Disp: , Rfl:  .  Multiple Vitamin (MULTIVITAMIN) tablet, Take 1 tablet by mouth daily., Disp: , Rfl:  .  simvastatin (ZOCOR) 40 MG tablet, Take 40 mg by mouth daily., Disp: , Rfl:  .  albuterol (VENTOLIN HFA) 108 (90 Base) MCG/ACT inhaler, SMARTSIG:2 Inhalation Via Inhaler Every 6 Hours PRN, Disp: , Rfl:  .  diclofenac sodium (VOLTAREN) 1 % GEL, Apply topically 2 (two) times daily. (Patient not taking: Reported on 02/09/2021), Disp: , Rfl:  .  JANUMET 50-500 MG tablet, Take 1 tablet by mouth 2 (two) times daily., Disp: , Rfl:  .  tiZANidine (ZANAFLEX) 2 MG tablet, Take by mouth every 8 (eight) hours as needed for muscle spasms. (Patient not taking: Reported on 02/09/2021), Disp: , Rfl:    Physical exam:  Vitals:   02/09/21 1525  BP: (!) 122/53  Pulse: 72  Temp: 98.3 F (36.8 C)  TempSrc: Tympanic  SpO2: 98%  Weight: 218 lb 8 oz (99.1 kg)   Physical Exam Constitutional:      General: She is not in acute distress. Cardiovascular:      Rate and Rhythm: Normal rate and regular rhythm.     Heart sounds: Normal heart sounds.  Pulmonary:     Effort: Pulmonary effort is normal.     Breath sounds: Normal breath sounds.  Abdominal:     General: Bowel sounds are normal.     Palpations: Abdomen is soft.  Lymphadenopathy:     Comments: No palpable cervical, supraclavicular, axillary or inguinal adenopathy   Skin:    General: Skin is warm and dry.  Neurological:     Mental Status: She is alert and oriented to person, place, and time.        No flowsheet data found. No flowsheet data found.  No images are attached to the encounter.  No results found.  Assessment and plan- Patient is a 60 y.o. female referred for mild leukocytosis mainly neutrophilia  Patient has had a mildly elevated white  count between 10-12 since May 2022.  High prior white cell counts have been normal.  Differential mainly shows neutrophilia.  I suspect this is reactive and does not require a bone marrow biopsy or other investigations just yet.I will repeat her counts in 2 months.  No recent changes in medications.  No recent infections and clinically no palpable adenopathy or hepatosplenomegaly.   Thank you for this kind referral and the opportunity to participate in the care of this  Patient   Visit Diagnosis 1. Other elevated white blood cell (WBC) count     Dr. Randa Evens, MD, MPH [facility] [phone] 02/10/2021

## 2021-03-05 ENCOUNTER — Ambulatory Visit
Admission: RE | Admit: 2021-03-05 | Discharge: 2021-03-05 | Disposition: A | Discharge status: Home / Self Care | Payer: BC Managed Care – PPO | Source: Ambulatory Visit | Attending: Physician Assistant | Admitting: Physician Assistant

## 2021-03-05 ENCOUNTER — Other Ambulatory Visit: Payer: Self-pay

## 2021-03-05 DIAGNOSIS — Z1231 Encounter for screening mammogram for malignant neoplasm of breast: Secondary | ICD-10-CM | POA: Diagnosis present

## 2021-03-05 IMAGING — MG MM DIGITAL SCREENING BILAT W/ TOMO AND CAD
6 of 10 series · 6 of 30 positions shown · non-contrast
Comparison: Previous exam(s).

CLINICAL DATA: Screening.

EXAM:
DIGITAL SCREENING BILATERAL MAMMOGRAM WITH TOMOSYNTHESIS AND CAD
TECHNIQUE: Bilateral screening digital craniocaudal and mediolateral oblique
mammograms were obtained. Bilateral screening digital breast
tomosynthesis was performed. The images were evaluated with
computer-aided detection.

[L CC synth-2D]
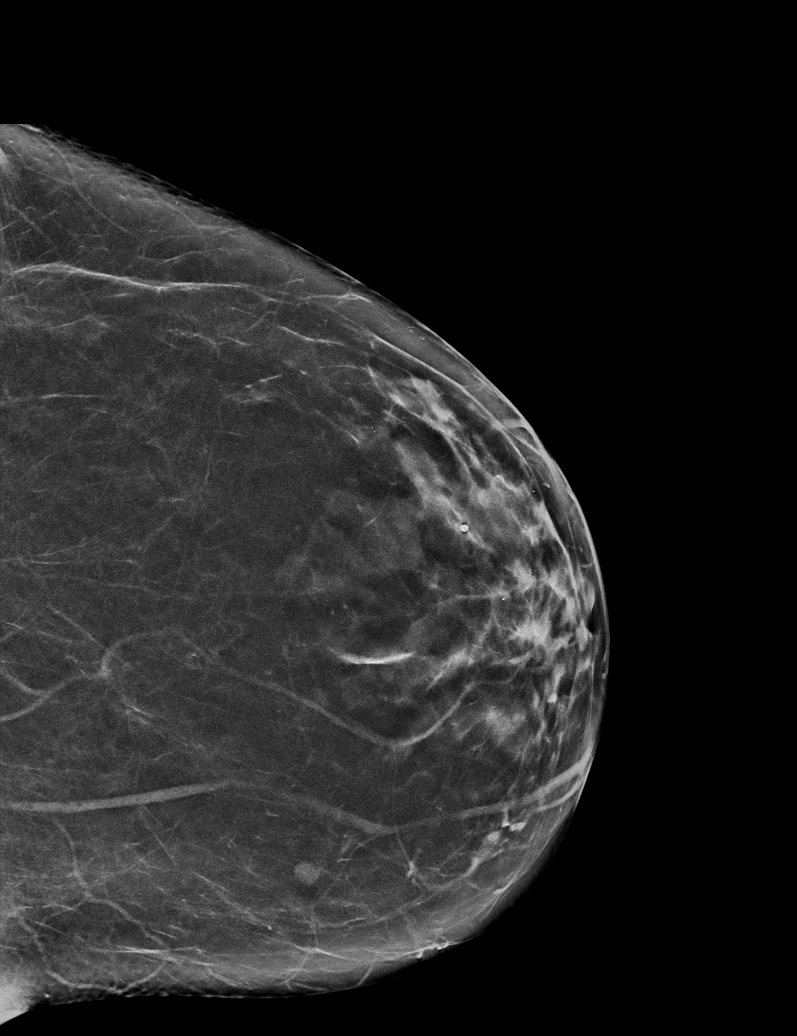

[L MLO synth-2D (1 of 2)]
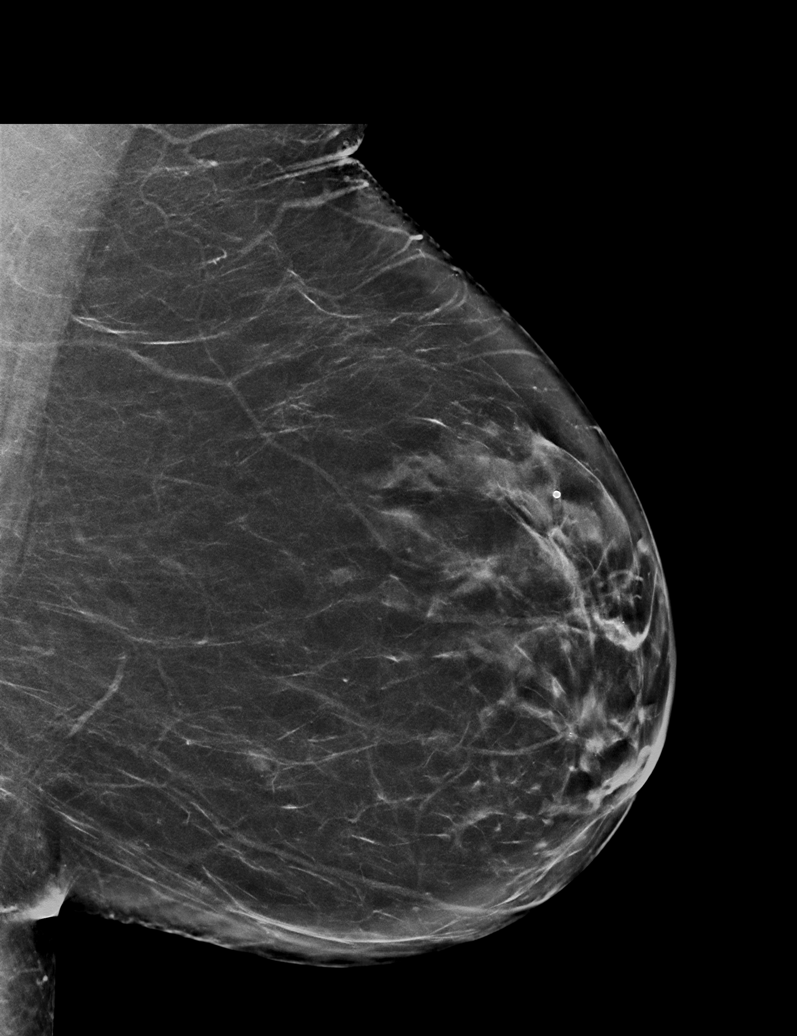

[R MLO synth-2D]
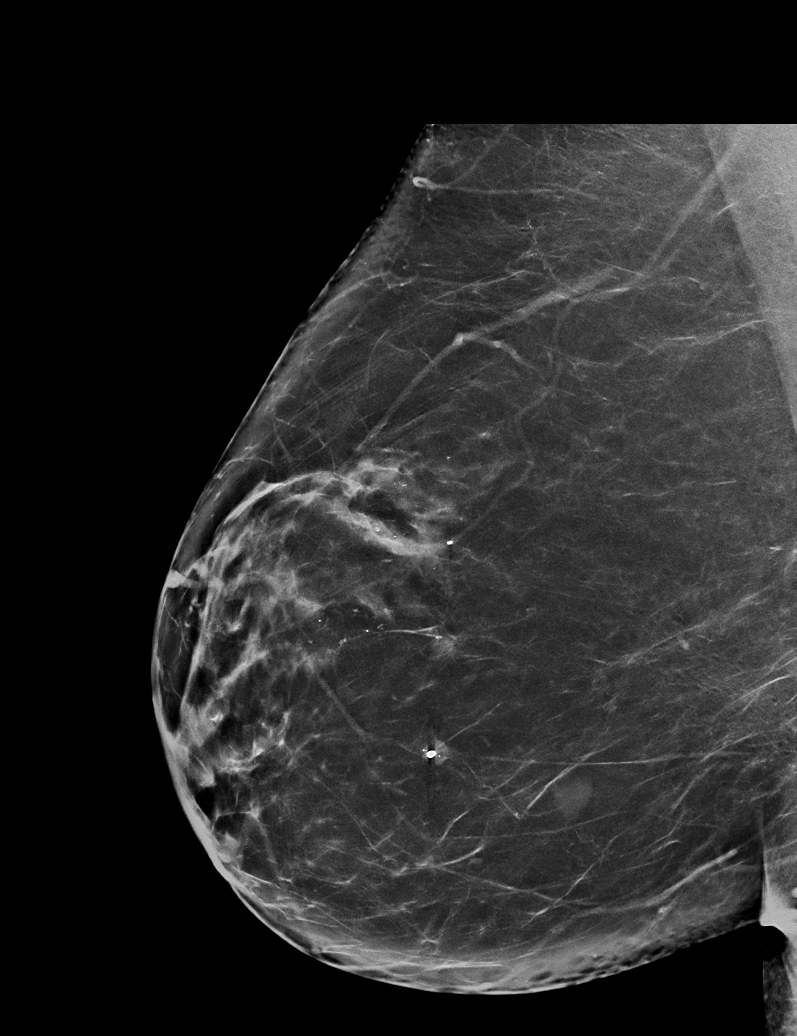

[R CC synth-2D]
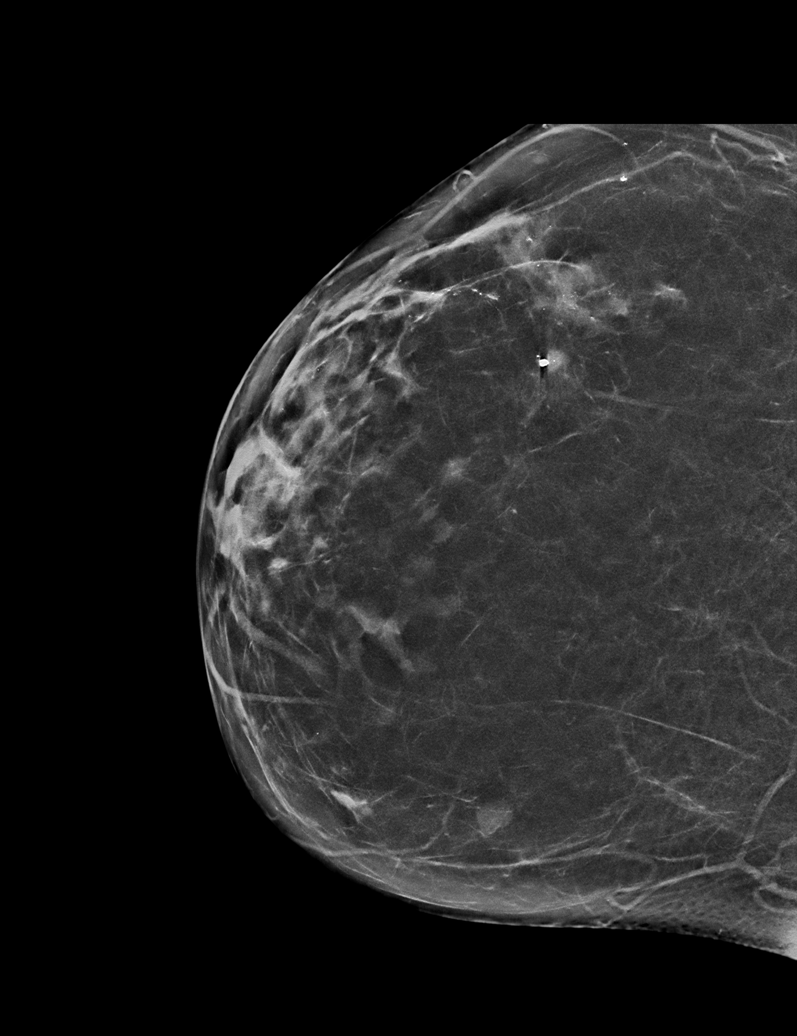

[L MLO synth-2D (2 of 2)]
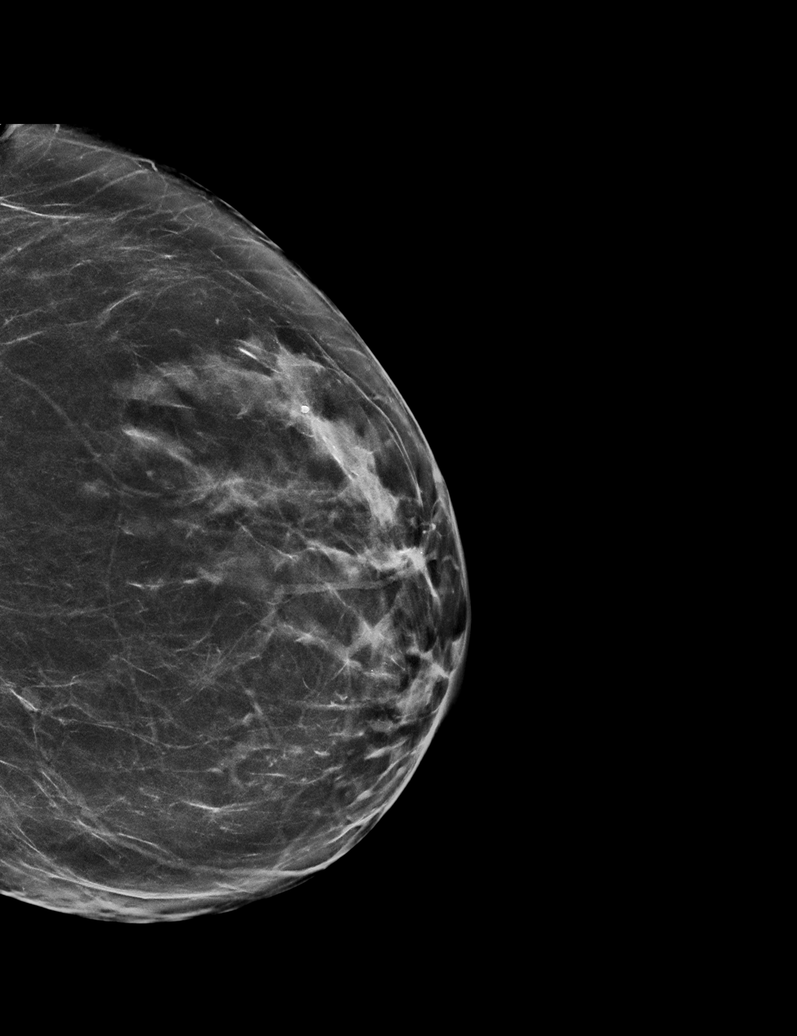

[L CC tomo · tomo slice 31/62.0]
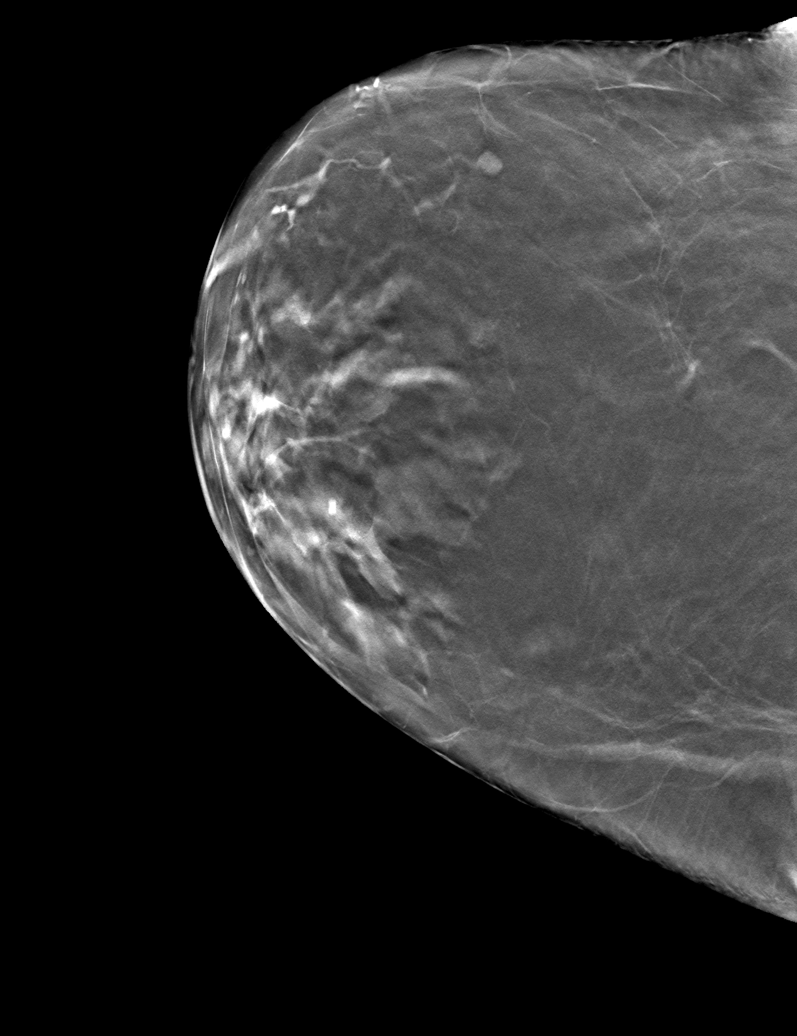

[6 of 30 positions shown; findings below may reference images not displayed]

ACR Breast Density Category b: There are scattered areas of
fibroglandular density.
FINDINGS: In the left breast, a possible mass warrants further evaluation. In
the right breast, no findings suspicious for malignancy.
IMPRESSION: Further evaluation is suggested for possible mass in the left
breast.

RECOMMENDATION:
Ultrasound of the left breast. (Code:[LS])

The patient will be contacted regarding the findings, and additional
imaging will be scheduled.

BI-RADS CATEGORY  0: Incomplete. Need additional imaging evaluation
and/or prior mammograms for comparison.

## 2021-03-08 ENCOUNTER — Other Ambulatory Visit: Payer: Self-pay | Admitting: Physician Assistant

## 2021-03-08 DIAGNOSIS — N6320 Unspecified lump in the left breast, unspecified quadrant: Secondary | ICD-10-CM

## 2021-03-08 DIAGNOSIS — R928 Other abnormal and inconclusive findings on diagnostic imaging of breast: Secondary | ICD-10-CM

## 2021-03-12 ENCOUNTER — Ambulatory Visit
Admission: RE | Admit: 2021-03-12 | Discharge: 2021-03-12 | Disposition: A | Discharge status: Home / Self Care | Payer: BC Managed Care – PPO | Source: Ambulatory Visit | Attending: Physician Assistant | Admitting: Physician Assistant

## 2021-03-12 ENCOUNTER — Other Ambulatory Visit: Payer: Self-pay

## 2021-03-12 DIAGNOSIS — R928 Other abnormal and inconclusive findings on diagnostic imaging of breast: Secondary | ICD-10-CM

## 2021-03-12 DIAGNOSIS — N6320 Unspecified lump in the left breast, unspecified quadrant: Secondary | ICD-10-CM | POA: Diagnosis present

## 2021-03-12 IMAGING — US US BREAST*L* LIMITED INC AXILLA
1 series · 6 of 6 positions shown · non-contrast
Comparison: Previous exam(s).

CLINICAL DATA: 59-year-old female recalled from screening mammogram
dated [DATE] for a possible left breast mass.

EXAM:
ULTRASOUND OF THE LEFT BREAST

[Series 1: us breast*left* limited inc axilla · 0.06mm/px · 6 of 6 slices shown]
[im 1/6]
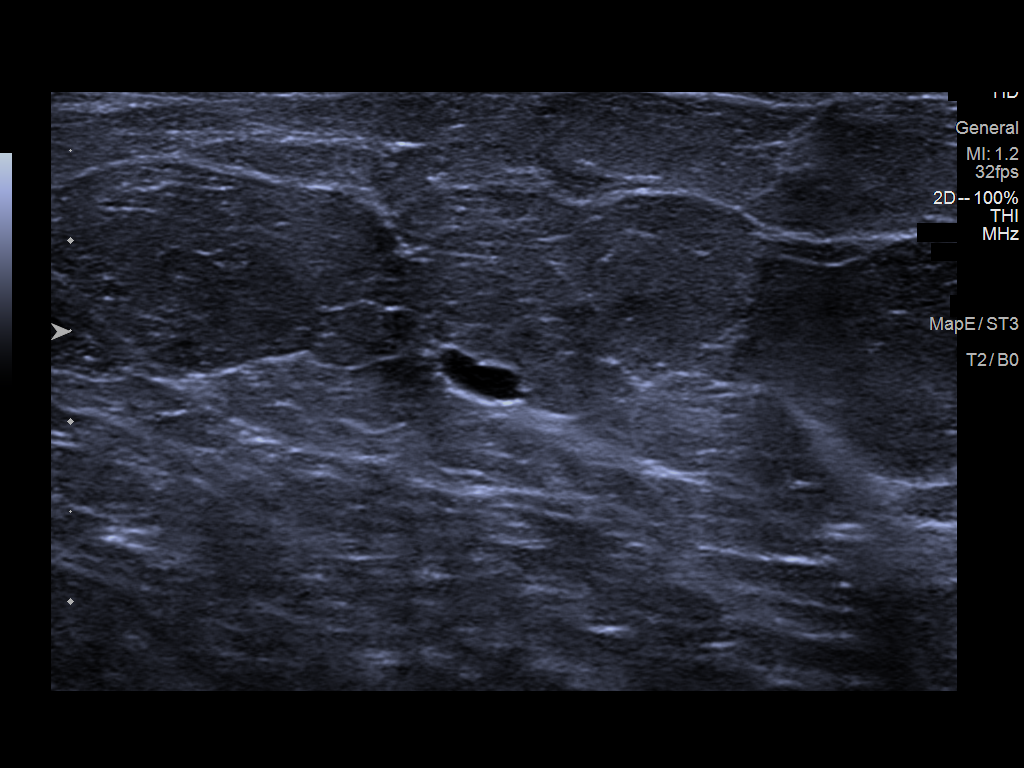
[im 2/6]
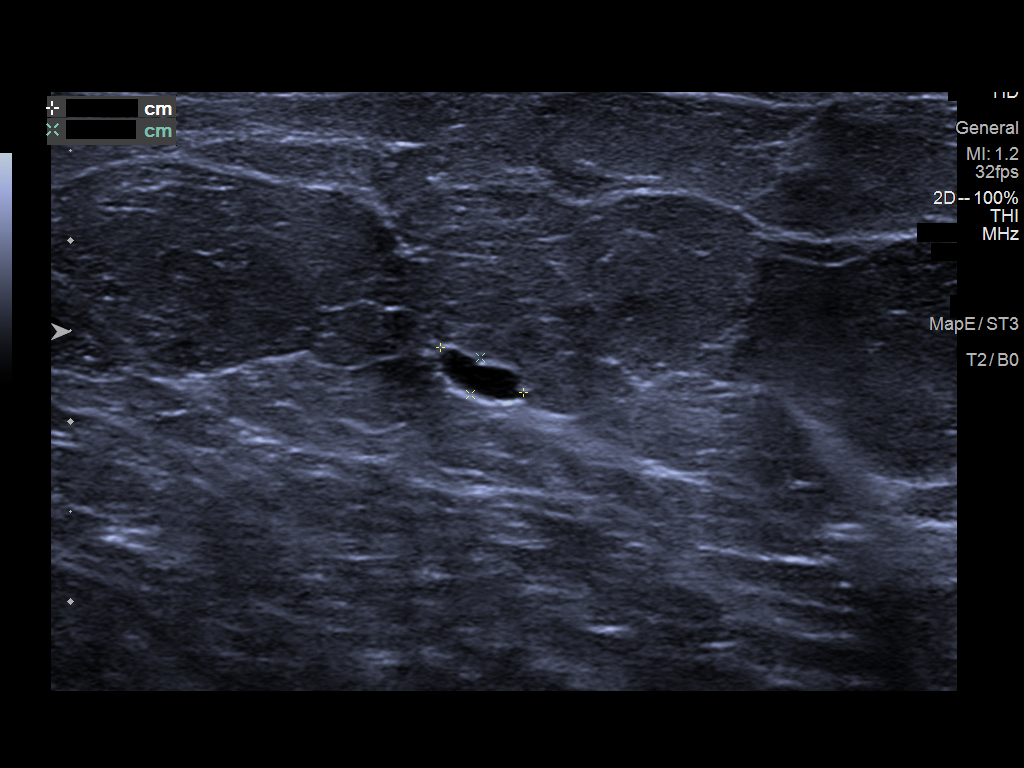
[im 3/6]
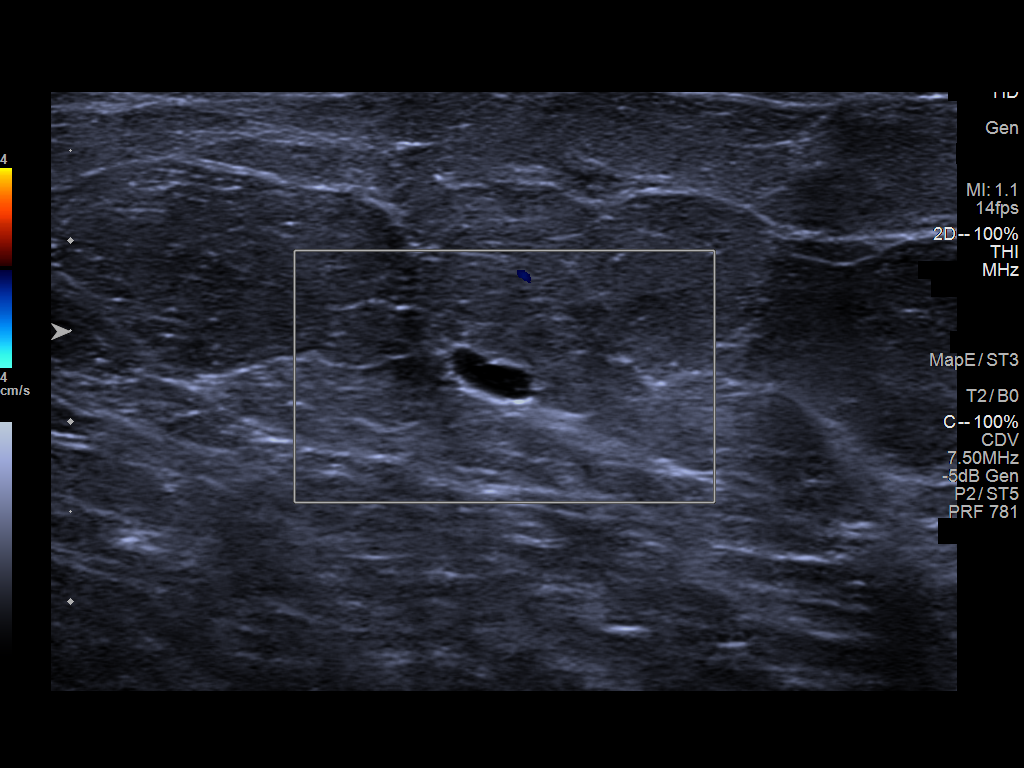
[im 4/6]
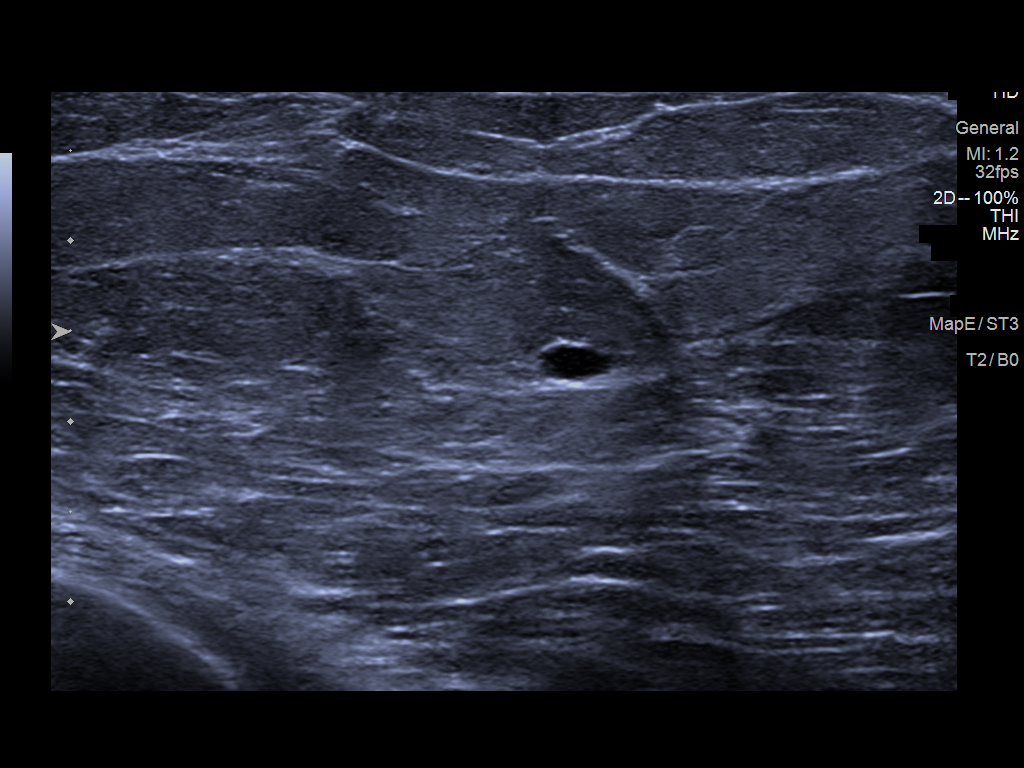
[im 5/6]
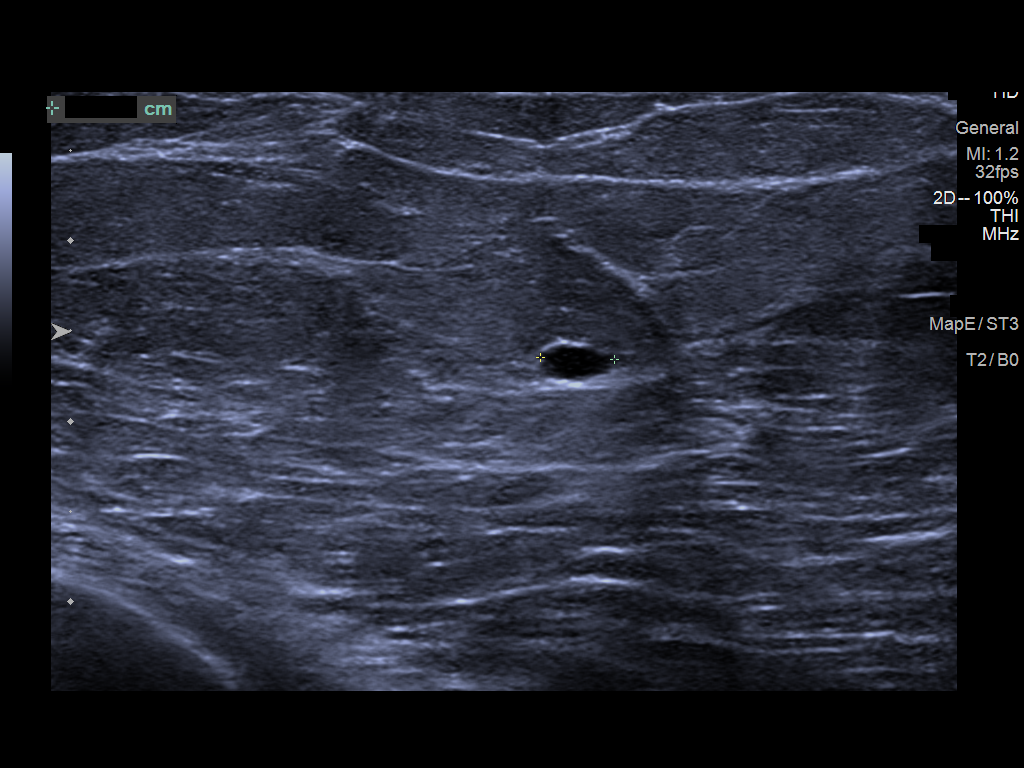
[im 6/6]
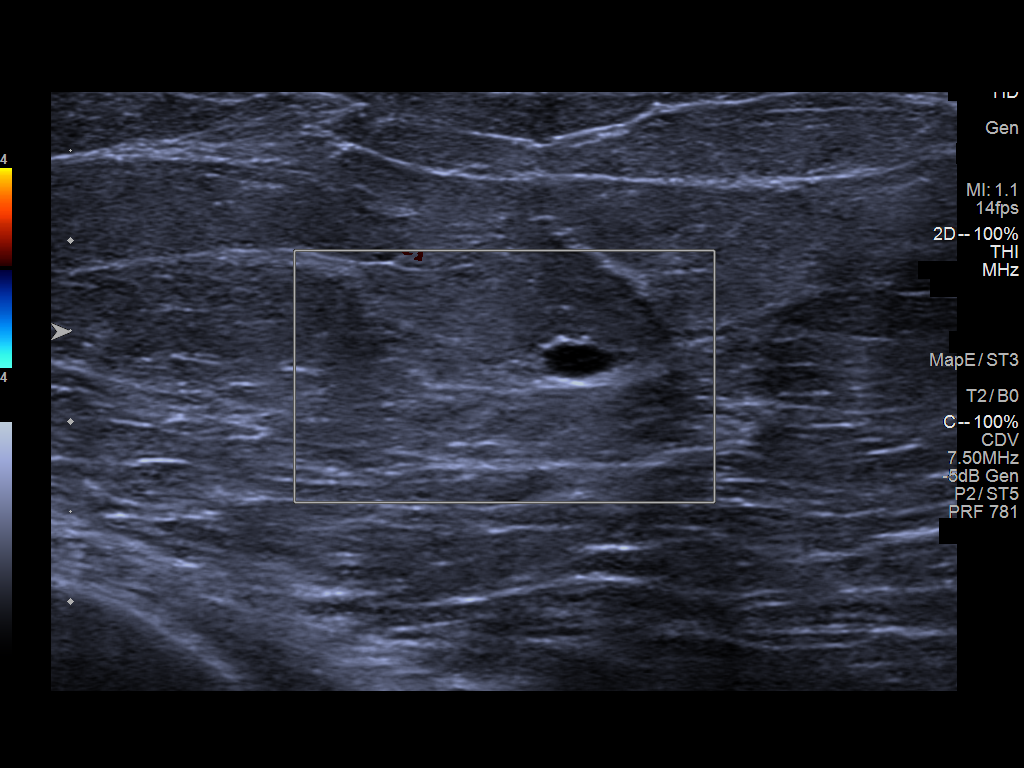

[6 of 6 positions shown; findings below may reference images not displayed]

FINDINGS: Targeted ultrasound is performed, showing an oval, circumscribed
anechoic mass at the [DATE] position 6 cm from the nipple. It measures
5 x 4 x 2 mm. There is no internal vascularity. This correlates well
with the mammographic finding and is consistent with a benign simple
cyst.
IMPRESSION: Benign simple cyst corresponding with the screening mammographic
finding.

RECOMMENDATION:
Screening mammogram in one year.(Code:[W8])

I have discussed the findings and recommendations with the patient.
If applicable, a reminder letter will be sent to the patient
regarding the next appointment.

BI-RADS CATEGORY  2: Benign.

## 2021-04-13 ENCOUNTER — Inpatient Hospital Stay: Payer: BC Managed Care – PPO | Attending: Oncology

## 2021-04-13 ENCOUNTER — Encounter: Payer: Self-pay | Admitting: Oncology

## 2021-04-13 ENCOUNTER — Inpatient Hospital Stay (HOSPITAL_BASED_OUTPATIENT_CLINIC_OR_DEPARTMENT_OTHER): Payer: BC Managed Care – PPO | Admitting: Oncology

## 2021-04-13 VITALS — BP 120/51 | HR 64 | Temp 97.5°F | Resp 16 | Wt 215.7 lb

## 2021-04-13 DIAGNOSIS — D729 Disorder of white blood cells, unspecified: Secondary | ICD-10-CM | POA: Diagnosis not present

## 2021-04-13 DIAGNOSIS — Z79899 Other long term (current) drug therapy: Secondary | ICD-10-CM | POA: Diagnosis not present

## 2021-04-13 DIAGNOSIS — D72829 Elevated white blood cell count, unspecified: Secondary | ICD-10-CM | POA: Diagnosis present

## 2021-04-13 DIAGNOSIS — E611 Iron deficiency: Secondary | ICD-10-CM

## 2021-04-13 DIAGNOSIS — D72828 Other elevated white blood cell count: Secondary | ICD-10-CM

## 2021-04-13 LAB — CBC WITH DIFFERENTIAL/PLATELET
Abs Immature Granulocytes: 0.05 10*3/uL (ref 0.00–0.07)
Basophils Absolute: 0.1 10*3/uL (ref 0.0–0.1)
Basophils Relative: 1 %
Eosinophils Absolute: 0.2 10*3/uL (ref 0.0–0.5)
Eosinophils Relative: 2 %
HCT: 38.9 % (ref 36.0–46.0)
Hemoglobin: 12.2 g/dL (ref 12.0–15.0)
Immature Granulocytes: 1 %
Lymphocytes Relative: 19 %
Lymphs Abs: 1.9 10*3/uL (ref 0.7–4.0)
MCH: 28 pg (ref 26.0–34.0)
MCHC: 31.4 g/dL (ref 30.0–36.0)
MCV: 89.2 fL (ref 80.0–100.0)
Monocytes Absolute: 0.6 10*3/uL (ref 0.1–1.0)
Monocytes Relative: 6 %
Neutro Abs: 7.4 10*3/uL (ref 1.7–7.7)
Neutrophils Relative %: 71 %
Platelets: 313 10*3/uL (ref 150–400)
RBC: 4.36 MIL/uL (ref 3.87–5.11)
RDW: 14.2 % (ref 11.5–15.5)
WBC: 10.1 10*3/uL (ref 4.0–10.5)
nRBC: 0 % (ref 0.0–0.2)

## 2021-04-13 LAB — FOLATE: Folate: 54 ng/mL (ref >5.9)

## 2021-04-13 LAB — IRON AND TIBC
Iron: 28 ug/dL (ref 28–170)
Saturation Ratios: 7 % — ABNORMAL LOW (ref 10.4–31.8)
TIBC: 407 ug/dL (ref 250–450)
UIBC: 379 ug/dL

## 2021-04-13 LAB — FERRITIN: Ferritin: 28 ng/mL (ref 11–307)

## 2021-04-13 LAB — VITAMIN B12: Vitamin B-12: 505 pg/mL (ref 180–914)

## 2021-04-13 NOTE — Progress Notes (Signed)
Patient here for treatment plan if any no concerns today.

## 2021-04-14 NOTE — Progress Notes (Signed)
Hematology/Oncology Consult note [name]  [phone] Fax:([phone]  Patient Care Team: Marinda Elk, MD as PCP - General (Physician Assistant)   Name of the patient: Pamela [name]  [id number]  03-17-1961   Date of visit: 04/14/21  Diagnosis- 1. Neutrophilia resolved  2. Iron deficiency without anemia  Chief complaint/ Reason for visit-routine follow-up of neutrophilia  Heme/Onc history:  Patient is a 60 year old female with a past medical history significant for hypertension and diabetes.  She also has a prior history of colon cancer back in 2003 that was treated in [facility] and she is s/p adjuvant chemotherapy her last surveillance colonoscopy was in 2020 per patient.  She has been referred to [name] for leukocytosis.  Her most recent labs from 01/29/2021 showed a white count of 12.6, H&H of 11.2/36.3 with a platelet count of 303.  Of note her white count has been ranging between 10-12 only since this month.  Prior to that in October 2021 her white count was normal between 7-10.  Patient has mild baseline anemia but her hemoglobin is remained stable between 11-11.5 since 2019.  Differential on her CBC mainly showed neutrophilia CMP was within normal limits.  Patient reports doing well overall and denies any specific complaints at this time.  Appetite and weight have remained stable.  Denies any recent infections or hospitalizations  Interval history-patient reports doing well and denies any specific complaints at this time.  Denies any bleeding in her stool or urine.  Denies any dark melanotic stools  ECOG PS- 0 Pain scale- 0   Review of systems- Review of Systems  Constitutional:  Negative for chills, fever, malaise/fatigue and weight loss.  HENT:  Negative for congestion, ear discharge and nosebleeds.   Eyes:  Negative for blurred vision.  Respiratory:  Negative for cough, hemoptysis, sputum production, shortness of breath and wheezing.    Cardiovascular:  Negative for chest pain, palpitations, orthopnea and claudication.  Gastrointestinal:  Negative for abdominal pain, blood in stool, constipation, diarrhea, heartburn, melena, nausea and vomiting.  Genitourinary:  Negative for dysuria, flank pain, frequency, hematuria and urgency.  Musculoskeletal:  Negative for back pain, joint pain and myalgias.  Skin:  Negative for rash.  Neurological:  Negative for dizziness, tingling, focal weakness, seizures, weakness and headaches.  Endo/Heme/Allergies:  Does not bruise/bleed easily.  Psychiatric/Behavioral:  Negative for depression and suicidal ideas. The patient does not have insomnia.     Allergies  Allergen Reactions   Rofecoxib Swelling     Past Medical History:  Diagnosis Date   Colon cancer ([place]) 2003   Diabetes mellitus without complication ([place])    Hypertension    Personal history of chemotherapy 2003   colon cancer   Uterine cancer ([place]) 2007     Past Surgical History:  Procedure Laterality Date   ABDOMINAL HYSTERECTOMY     BREAST EXCISIONAL BIOPSY Right 11/26/2010   neg/- PROLIFERATIVE FIBROCYSTIC CHANGE WITH ASSOCIATED    CHOLECYSTECTOMY     COLON SURGERY     2003   COLONOSCOPY     COLONOSCOPY WITH PROPOFOL N/A 12/11/2018   Procedure: COLONOSCOPY WITH PROPOFOL;  Surgeon: Lollie Sails, MD;  Location: [facility] ENDOSCOPY;  Service: Endoscopy;  Laterality: N/A;    Social History   Socioeconomic History   Marital status: Married    Spouse name: Not on file   Number of children: Not on file   Years of education: Not on file   Highest education level: Not on  file  Occupational History   Not on file  Tobacco Use   Smoking status: Every Day    Packs/day: 1.00    Years: 43.00    Pack years: 43.00    Types: Cigarettes   Smokeless tobacco: Never   Tobacco comments:    1 to 2 cigarettes per day now  Vaping Use   Vaping Use: Never used  Substance and Sexual Activity   Alcohol use: Not Currently    Drug use: Never   Sexual activity: Not on file  Other Topics Concern   Not on file  Social History Narrative   Not on file   Social Determinants of Health   Financial Resource Strain: Not on file  Food Insecurity: Not on file  Transportation Needs: Not on file  Physical Activity: Not on file  Stress: Not on file  Social Connections: Not on file  Intimate Partner Violence: Not on file    Family History  Problem Relation Age of Onset   Breast cancer Maternal Aunt 60   Breast cancer Paternal Aunt 82   Breast cancer Cousin 18   Breast cancer Cousin 32     Current Outpatient Medications:    albuterol (VENTOLIN HFA) 108 (90 Base) MCG/ACT inhaler, SMARTSIG:2 Inhalation Via Inhaler Every 6 Hours PRN, Disp: , Rfl:    diclofenac sodium (VOLTAREN) 1 % GEL, Apply topically 2 (two) times daily., Disp: , Rfl:    esomeprazole (NEXIUM) 20 MG capsule, Take 20 mg by mouth daily at 12 noon., Disp: , Rfl:    ferrous sulfate 325 (65 FE) MG EC tablet, Take 325 mg by mouth daily., Disp: , Rfl:    JANUMET 50-500 MG tablet, Take 1 tablet by mouth 2 (two) times daily., Disp: , Rfl:    levothyroxine (SYNTHROID, LEVOTHROID) 50 MCG tablet, Take 50 mcg by mouth daily before breakfast., Disp: , Rfl:    lisinopril-hydrochlorothiazide (PRINZIDE,ZESTORETIC) 10-12.5 MG tablet, Take 1 tablet by mouth daily., Disp: , Rfl:    magnesium oxide (MAG-OX) 400 MG tablet, Take 500 mg by mouth daily., Disp: , Rfl:    Multiple Vitamin (MULTIVITAMIN) tablet, Take 1 tablet by mouth daily., Disp: , Rfl:    simvastatin (ZOCOR) 40 MG tablet, Take 40 mg by mouth daily., Disp: , Rfl:    tiZANidine (ZANAFLEX) 2 MG tablet, Take by mouth every 8 (eight) hours as needed for muscle spasms., Disp: , Rfl:   Physical exam:  Vitals:   04/13/21 1117  BP: (!) 120/51  Pulse: 64  Resp: 16  Temp: (!) 97.5 F (36.4 C)  TempSrc: Tympanic  SpO2: 100%  Weight: 215 lb 11.2 oz (97.8 kg)   Physical Exam Constitutional:      General: She  is not in acute distress. Cardiovascular:     Rate and Rhythm: Normal rate and regular rhythm.  Pulmonary:     Effort: Pulmonary effort is normal.  Skin:    General: Skin is warm and dry.  Neurological:     Mental Status: She is alert and oriented to person, place, and time.     No flowsheet data found. CBC Latest Ref Rng & Units 04/13/2021  WBC 4.0 - 10.5 K/uL 10.1  Hemoglobin 12.0 - 15.0 g/dL 12.2  Hematocrit 36.0 - 46.0 % 38.9  Platelets 150 - 400 K/uL 313   Assessment and plan- Patient is a 60 y.o. female who is here for follow-up of following issues:  Neutrophilia: Patient had a transient increase in her white count11 which mainly  showed neutrophilia.  Presently her white count is normal with a WBC of 10.1 with a normal differential.  She therefore does not require any follow-up for her neutrophilia.  If there is a consistent increase in her white cell count in the future she can be referred to [name].  2.  Patient had mild anemia during her prior visit due to which I repeated her CBC and iron studies today.  She is not presently anemic with a hemoglobin of 12.2 but does have evidence of iron deficiency as evidenced by a ferritin level of 28 and iron saturation of 7%.  We will reach out to her and let her know to start taking oral iron once a day.  Her last colonoscopy was in 2020.  She will need to discuss with Dr. Carrie Mew regarding GI evaluation if iron deficiency persists.  Patient can continue to follow-up with her PCP for this and take oral iron once a day.  If her anemia worsens despite oral iron she can be referred to [name] in the future   Visit Diagnosis 1. Neutrophilia   2. Iron deficiency      Dr. Randa Evens, MD, MPH [facility] [phone] 04/14/2021 9:59 AM

## 2021-04-16 ENCOUNTER — Encounter: Payer: Self-pay | Admitting: *Deleted

## 2021-10-18 ENCOUNTER — Other Ambulatory Visit: Payer: Self-pay | Admitting: Physician Assistant

## 2021-10-18 ENCOUNTER — Other Ambulatory Visit: Payer: Self-pay | Admitting: *Deleted

## 2021-10-18 ENCOUNTER — Telehealth: Payer: Self-pay | Admitting: *Deleted

## 2021-10-18 ENCOUNTER — Telehealth: Payer: Self-pay

## 2021-10-18 ENCOUNTER — Other Ambulatory Visit (HOSPITAL_COMMUNITY): Payer: Self-pay | Admitting: Physician Assistant

## 2021-10-18 ENCOUNTER — Ambulatory Visit
Admission: RE | Admit: 2021-10-18 | Discharge: 2021-10-18 | Disposition: A | Discharge status: Home / Self Care | Payer: BC Managed Care – PPO | Source: Ambulatory Visit | Attending: Physician Assistant | Admitting: Physician Assistant

## 2021-10-18 ENCOUNTER — Other Ambulatory Visit: Payer: Self-pay

## 2021-10-18 DIAGNOSIS — J90 Pleural effusion, not elsewhere classified: Secondary | ICD-10-CM | POA: Insufficient documentation

## 2021-10-18 DIAGNOSIS — R160 Hepatomegaly, not elsewhere classified: Secondary | ICD-10-CM

## 2021-10-18 DIAGNOSIS — R1012 Left upper quadrant pain: Secondary | ICD-10-CM | POA: Insufficient documentation

## 2021-10-18 DIAGNOSIS — R112 Nausea with vomiting, unspecified: Secondary | ICD-10-CM | POA: Insufficient documentation

## 2021-10-18 DIAGNOSIS — R935 Abnormal findings on diagnostic imaging of other abdominal regions, including retroperitoneum: Secondary | ICD-10-CM

## 2021-10-18 DIAGNOSIS — E611 Iron deficiency: Secondary | ICD-10-CM

## 2021-10-18 IMAGING — CT CT CHEST W/ CM
2 of 4 series · 14 of 36 positions shown, 17 images · IV contrast (agent unspecified)
Comparison: Comparison made with abdominal CT of the same date.
Also with chest CT from [DATE].

CLINICAL DATA: Abnormal CT of the abdomen and pelvis for which
further evaluation with chest CT was suggested.

EXAM:
CT CHEST WITH CONTRAST
TECHNIQUE: Multidetector CT imaging of the chest was performed during
intravenous contrast administration.

[Series 2: axial st · axial · 0.70mm/px · z∈[-526,-248]mm · 11 of 165 slices shown, 14 images]
[im 13/165  mediastinal]
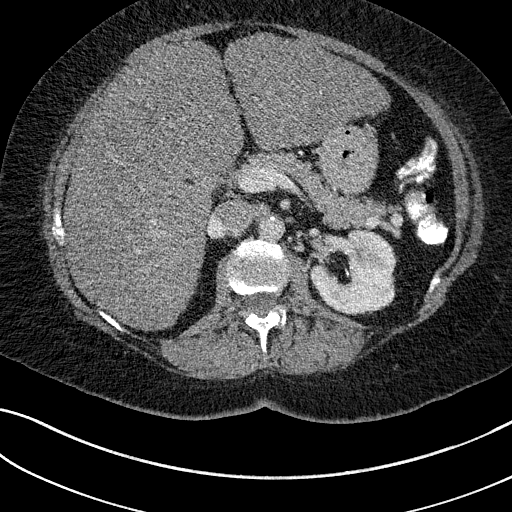
[im 13/165  lung]
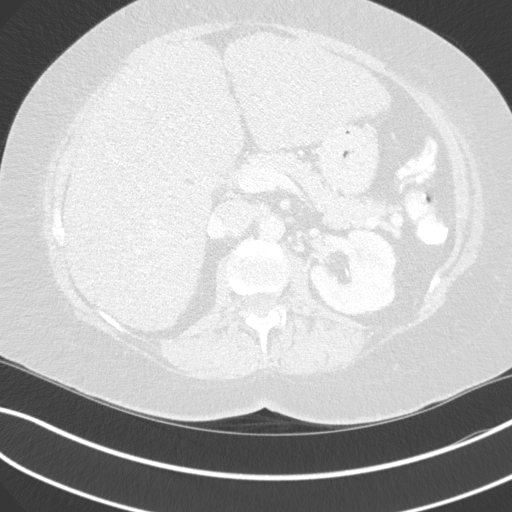
[im 26/165  lung]
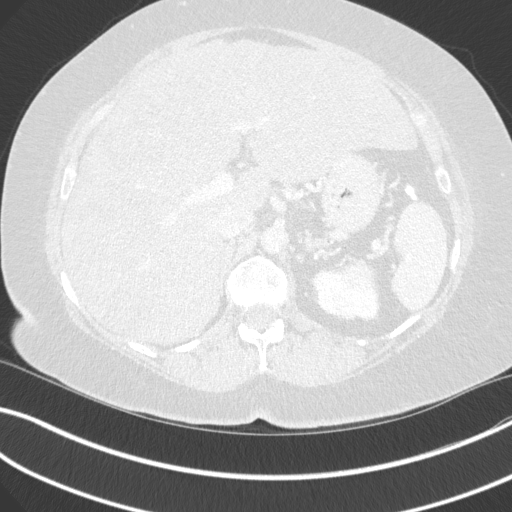
[im 38/165  lung]
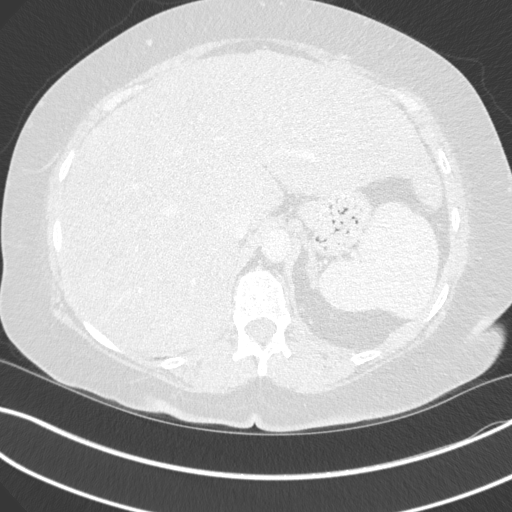
[im 51/165  lung]
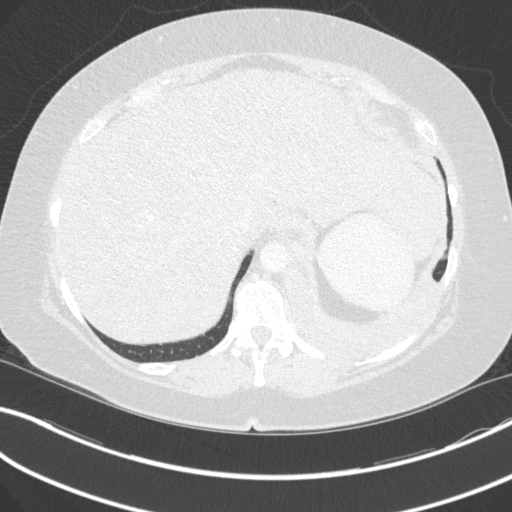
[im 64/165  mediastinal]
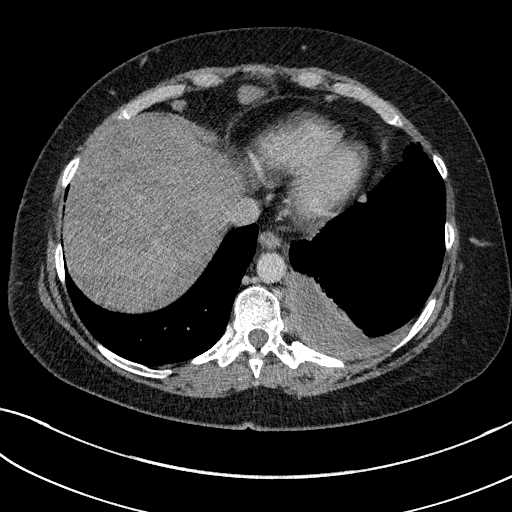
[im 64/165  lung]
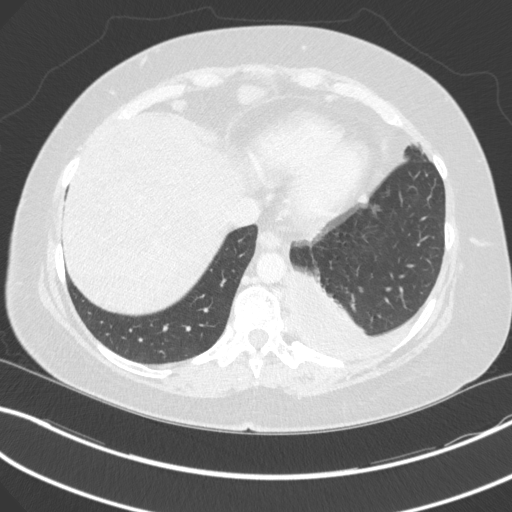
[im 89/165  lung]
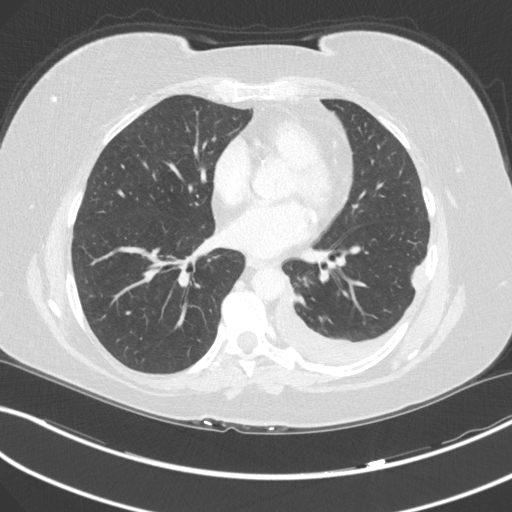
[im 101/165  lung]
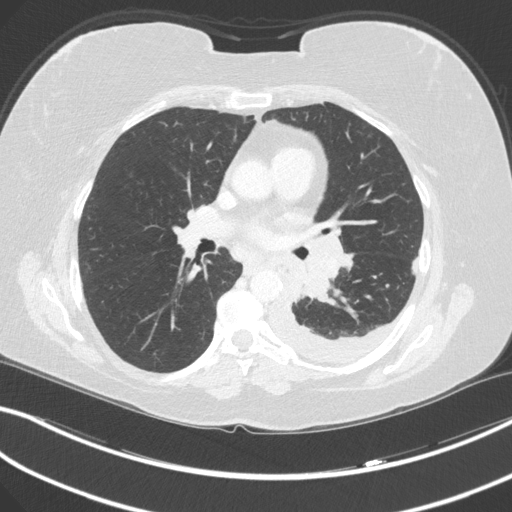
[im 114/165  lung]
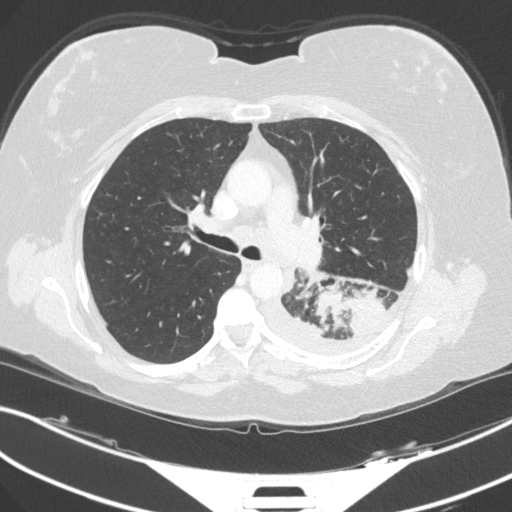
[im 127/165  mediastinal]
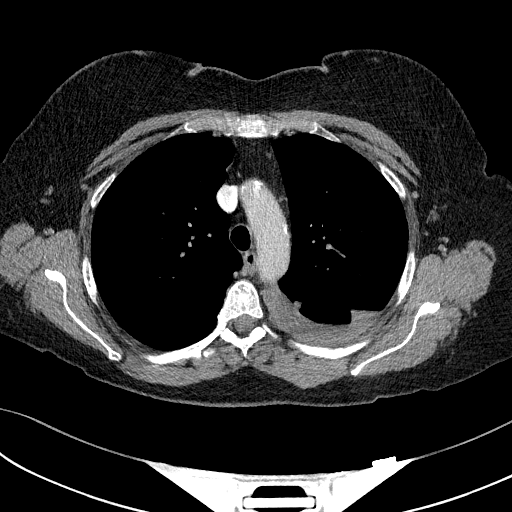
[im 127/165  lung]
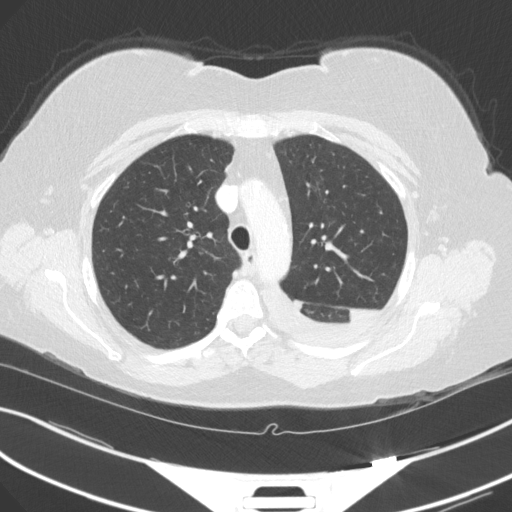
[im 139/165  lung]
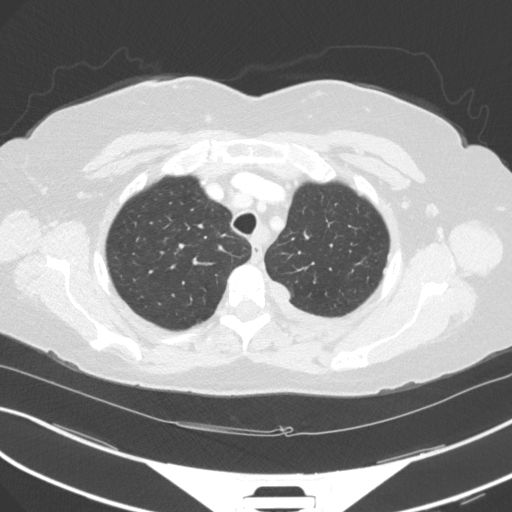
[im 152/165  lung]
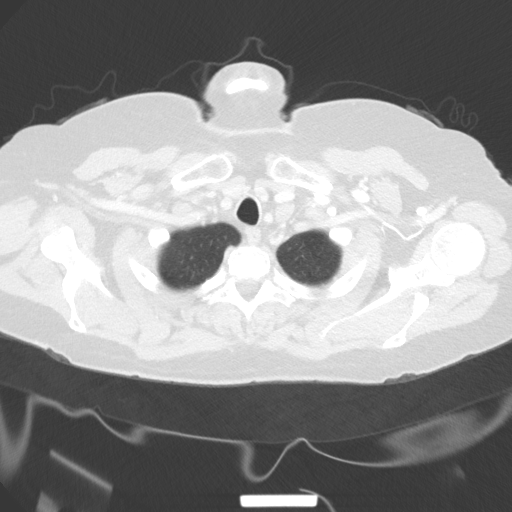

[Series 5: coronal · coronal · 0.72mm/px · 3 of 147 slices shown]
[im 30/147  lung]
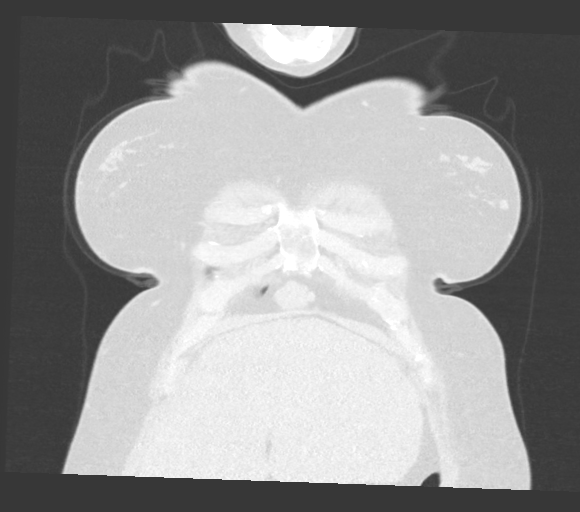
[im 59/147  lung]
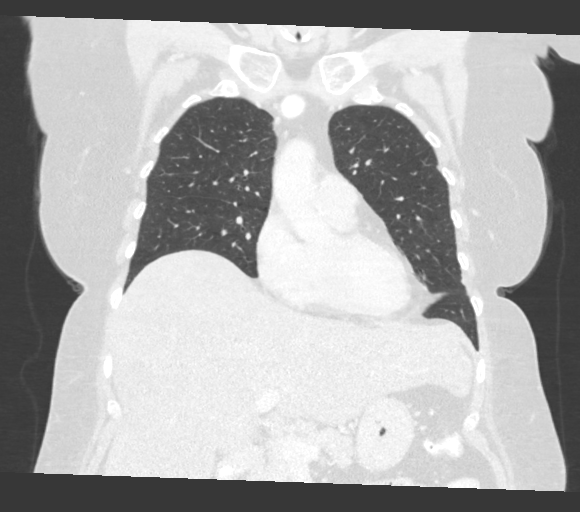
[im 88/147  lung]
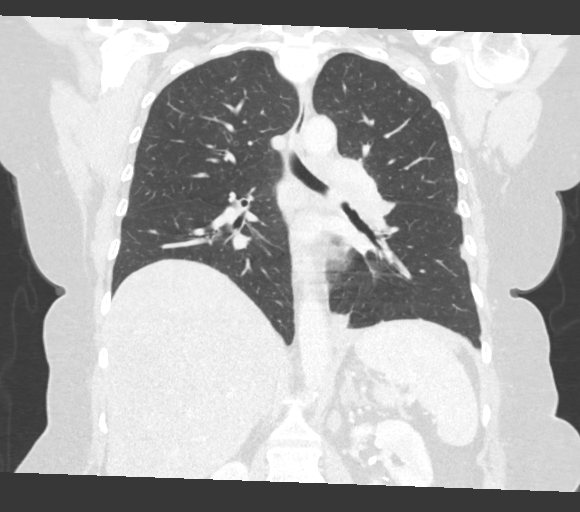

[14 of 36 positions shown; findings below may reference images not displayed]

RADIATION DOSE REDUCTION: This exam was performed according to the
departmental dose-optimization program which includes automated
exposure control, adjustment of the mA and/or kV according to
patient size and/or use of iterative reconstruction technique.

CONTRAST:  60mL OMNIPAQUE IOHEXOL 300 MG/ML  SOLN
FINDINGS: Cardiovascular: Calcified atheromatous plaque of the thoracic aorta.
No aneurysmal dilation. Heart size is normal. There are numerous pre
pericardial lymph nodes with enlargement, see below. Central
pulmonary vasculature is remarkable for narrowing due to extensive
LEFT hilar adenopathy of the LEFT pulmonary artery. Otherwise not
well evaluated on this venous phase.

Mediastinum/Nodes: Extensive mediastinal and LEFT hilar adenopathy.

LEFT thoracic inlet lymph node 11 mm (image [DATE])

LEFT paratracheal lymph node anterior to the esophagus (image [DATE])
14 mm.

Low LEFT paratracheal blending into AP window adenopathy (image
50/2) 16 mm.

Subcarinal adenopathy, 17 mm (image 60/2)

LEFT hilar nodal mass obscuring LEFT pulmonary artery measuring up
to 4.0 x 2.6 cm (image 61/2)

Pre pericardial nodal enlargement largest anterior to the RIGHT
heart (image 98/2) 18 mm. Juxta esophageal lymph node with
enlargement (image 108/2) 12 mm. No RIGHT hilar adenopathy.

Lungs/Pleura: Extensive pleural disease with pleural studding
involving the entire LEFT chest, occasional pleural nodules tracking
over the LEFT lung apex with most pronounced pleural involvement in
the dependent aspect of the chest in the inferior posterior
costodiaphragmatic recess. Here pleural involvement invades the fat
adjacent to the aorta (image 111/2) masslike area measuring up to
2.6 x 1.9 cm is contiguous with nodular pleural thickening that
extends along the entire posterior LEFT chest tracking to the mid
and upper chest mixed with pleural fluid.

Discrete nodules such as an area on image 77/2 track along the
dependent and lateral greater than the anterior chest towards the
LEFT lung apex and are scattered in terms of distribution, the
largest area measuring approximately 1.8 x 1.0 cm (image 77/2)

Masslike area in the superior segment of the AA LEFT lower lobe
measuring 4.3 x 3.6 cm (image 49/2) this is contiguous with nodular
masslike soft tissue that extends to the LEFT hilum (image 56/2)
pleural nodularity of the major fissure also demonstrated.

RIGHT chest is clear.

Upper Abdomen: Diffusely nodular and heterogeneous appearance of the
liver with variable density throughout the liver and suggestion of
masslike areas underlying steatotic changes is not well evaluated,
better assessed on the prior CT of the abdomen which shows clear
hepatic metastatic disease. Pancreas, visualized portions as well as
the spleen without acute process. Adrenal glands are unremarkable.

Musculoskeletal: Many of the areas of pleural disease along the LEFT
chest appear to involve the extrapleural fat. No acute
musculoskeletal process
IMPRESSION: 1. Pulmonary mass with extensive interstitial disease, mediastinal
adenopathy and pleural metastases, most suspicious for bronchogenic
neoplasm.
2. Diffusely nodular and heterogeneous liver show signs of
metastatic disease that are better seen on the CT of the abdomen and
pelvis.
3. Aortic atherosclerosis.

Aortic Atherosclerosis ([AF]-[AF]).

## 2021-10-18 IMAGING — CT CT ABD-PELV W/ CM
2 of 5 series · 15 of 46 positions shown, 17 images · IV contrast (APPLIED)
Comparison: CT chest [DATE]

CLINICAL DATA: Elevated LFTs and abdominal pain.

EXAM:
CT ABDOMEN AND PELVIS WITH CONTRAST
TECHNIQUE: Multidetector CT imaging of the abdomen and pelvis was performed
using the standard protocol following bolus administration of
intravenous contrast.

[Series 2: routine abd/pel with · axial · 0.85mm/px · z∈[-777,-357]mm · 12 of 98 slices shown, 14 images]
[im 7/98  soft-tissue]
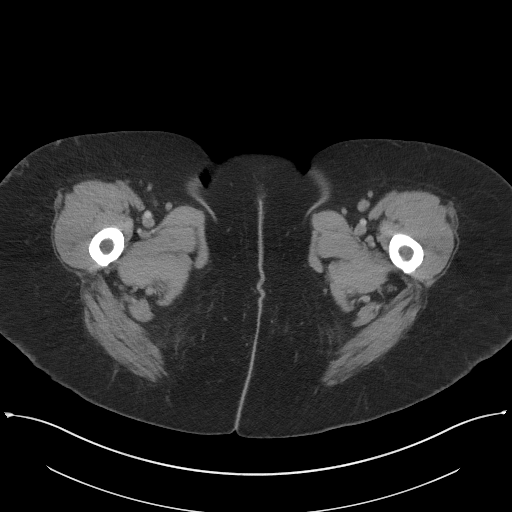
[im 7/98  bone]
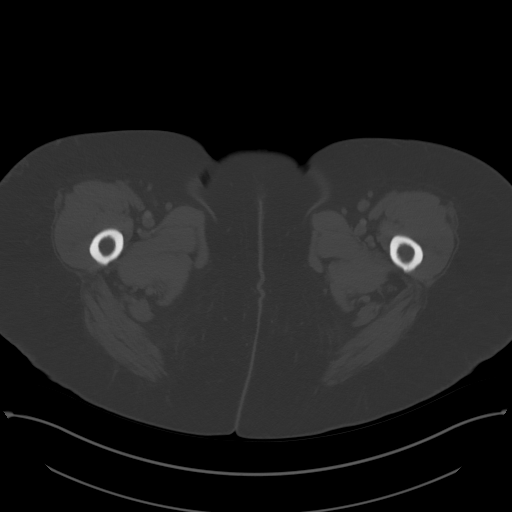
[im 13/98  soft-tissue]
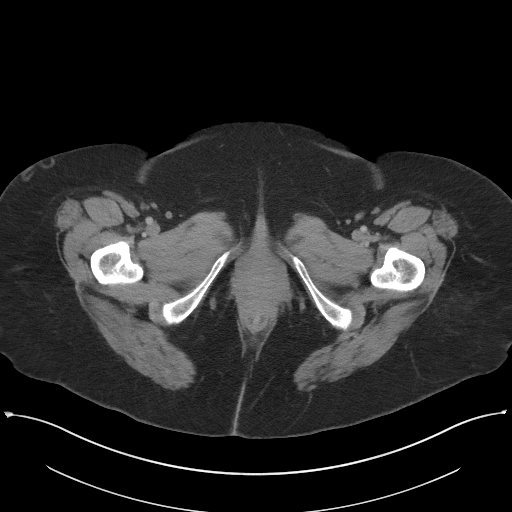
[im 25/98  soft-tissue]
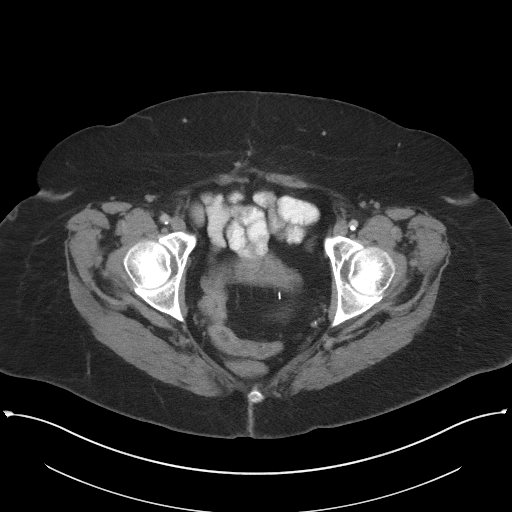
[im 31/98  soft-tissue]
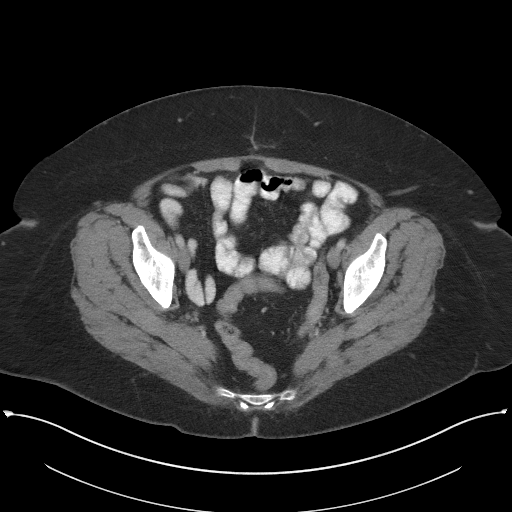
[im 37/98  soft-tissue]
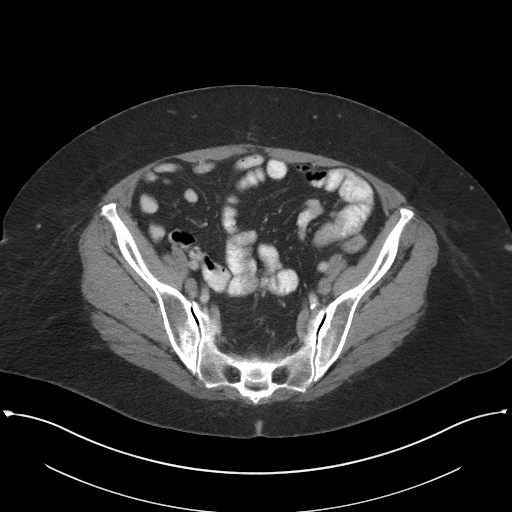
[im 43/98  soft-tissue]
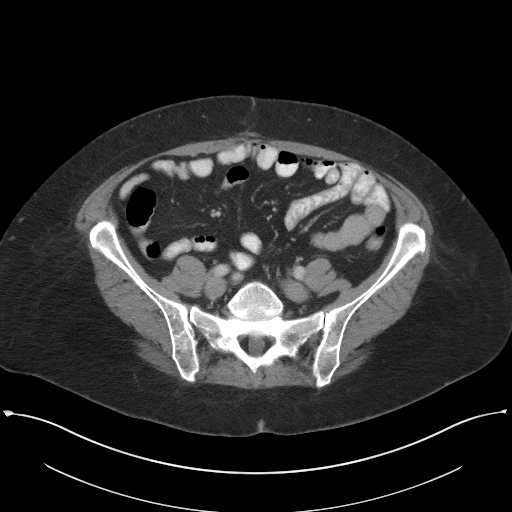
[im 55/98  soft-tissue]
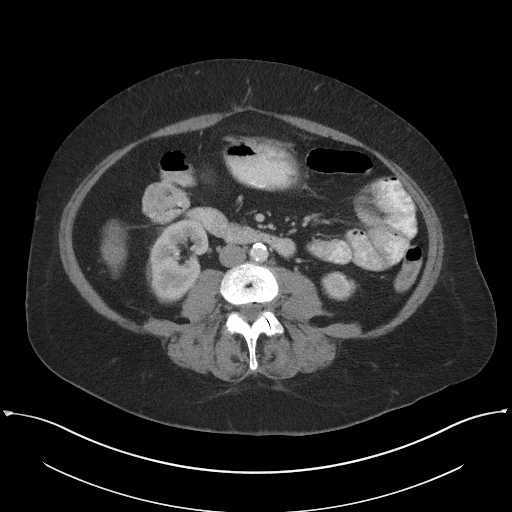
[im 61/98  soft-tissue]
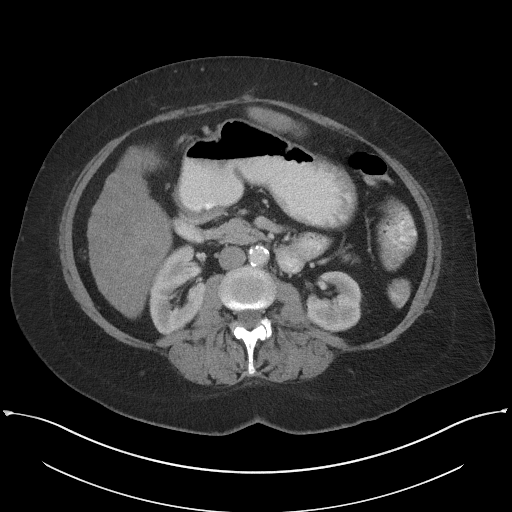
[im 67/98  soft-tissue]
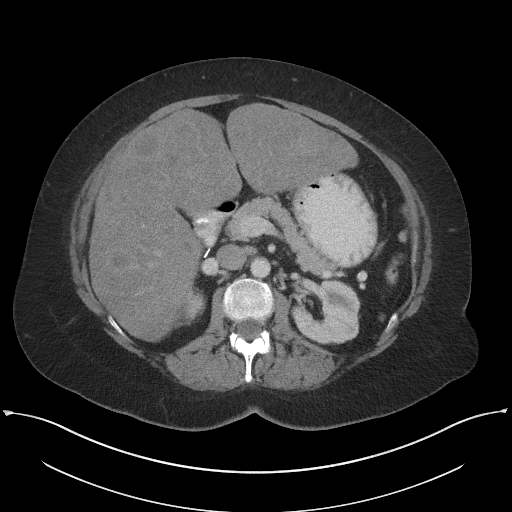
[im 67/98  bone]
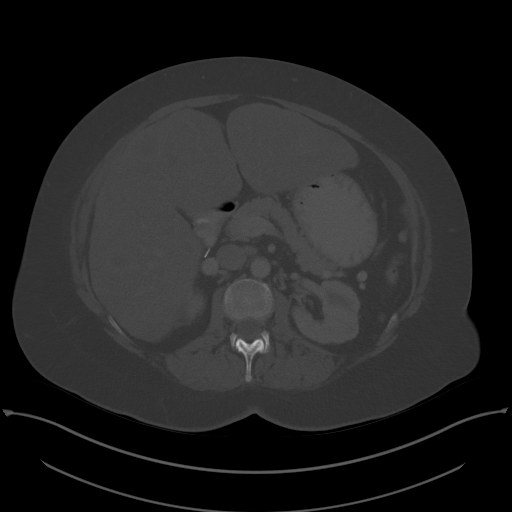
[im 73/98  soft-tissue]
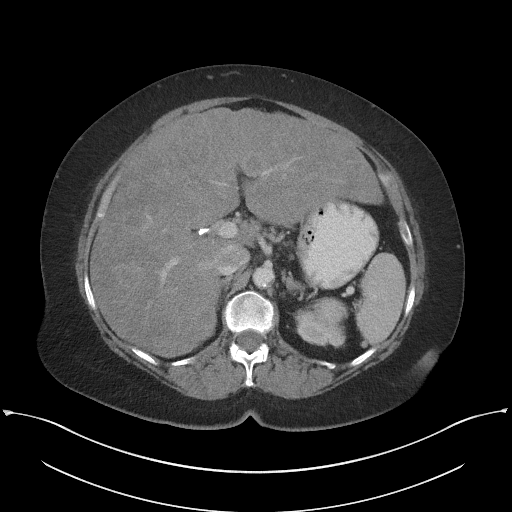
[im 85/98  soft-tissue]
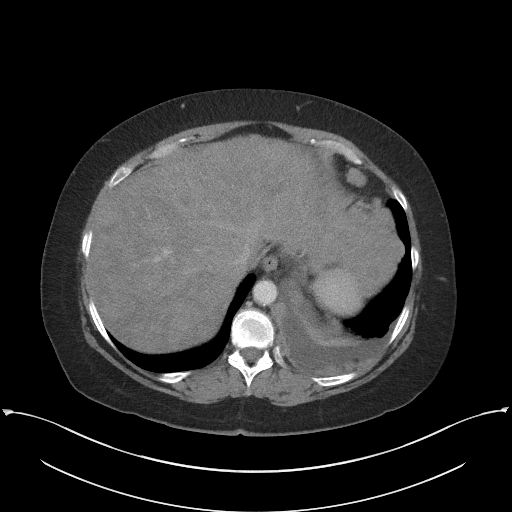
[im 91/98  soft-tissue]
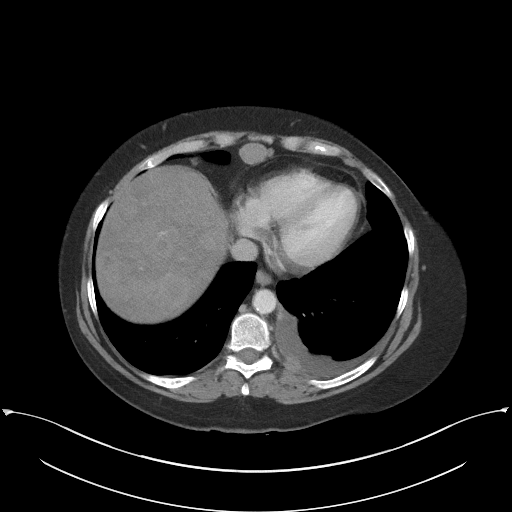

[Series 5: coronal st · coronal · 0.95mm/px · 3 of 109 slices shown]
[im 37/109  soft-tissue]
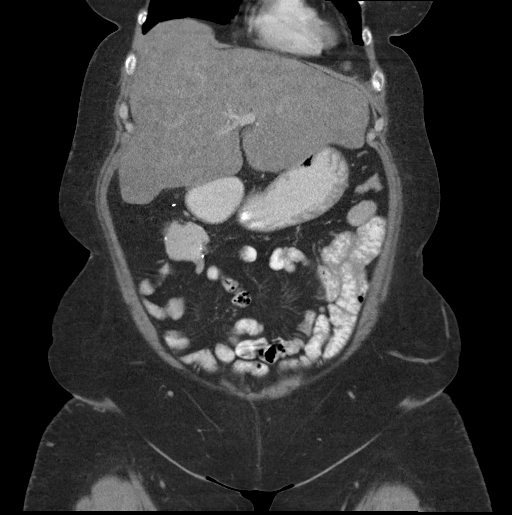
[im 49/109  soft-tissue]
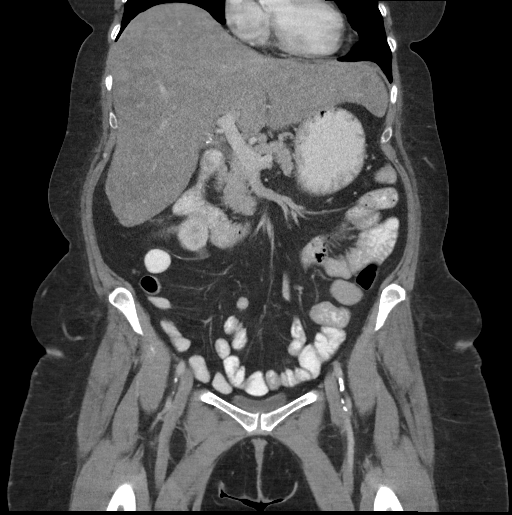
[im 61/109  soft-tissue]
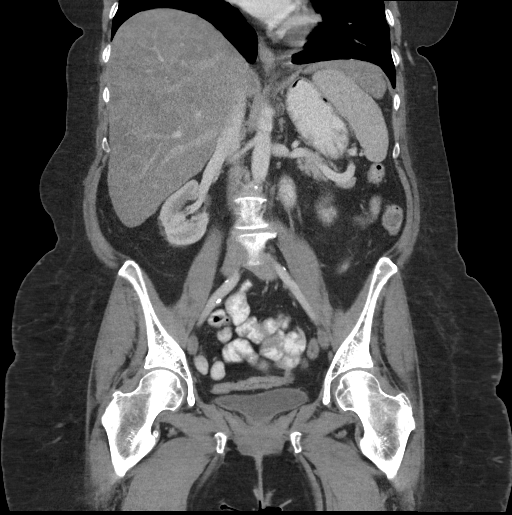

[15 of 46 positions shown; findings below may reference images not displayed]

RADIATION DOSE REDUCTION: This exam was performed according to the
departmental dose-optimization program which includes automated
exposure control, adjustment of the mA and/or kV according to
patient size and/or use of iterative reconstruction technique.

CONTRAST:  100mL OMNIPAQUE IOHEXOL 300 MG/ML  SOLN
FINDINGS: Lower chest: Partially loculated scratch set there is a small
partially loculated left pleural effusion identified. Signs of
pleural spread of tumor within the left hemithorax identified with
multiple pleural based soft tissue nodules overlying the visualized
portions of the left lung base. Right lung appears clear. Enlarged
right cardiophrenic angle lymph node measures 1.7 cm, image [DATE].
Left cardiophrenic angle lymph node is enlarged measuring 1.2 cm,
image [DATE].

Hepatobiliary: The liver has a pseudo cirrhotic appearance due to
diffuse liver metastases:

Index lesion within the posterior dome measures 2.8 x 2.8 cm, image
[DATE].

Posterior lateral right lobe of liver lesion measures 2.1 x 2.3 cm,
image [DATE].

Inferior right lobe of liver lesion measures 2.8 x 2.5 cm, image
42/2.

Index lesion within the lateral segment of left hepatic lobe
measures 2.1 by 2.0 cm, image [DATE]

Status post cholecystectomy.  No bile duct dilatation.

Pancreas: Unremarkable. No pancreatic ductal dilatation or
surrounding inflammatory changes.

Spleen: Normal in size without focal abnormality.

Adrenals/Urinary Tract: Normal adrenal glands.

No suspicious mass or hydronephrosis identified bilaterally. Small
cyst within inferior pole of right kidney measures 1.5 cm. The
urinary bladder is unremarkable.

Stomach/Bowel: Stomach is nondistended. Postsurgical changes from
right hemicolectomy with enterocolonic anastomosis. No bowel wall
thickening, inflammation, or distension.

Vascular/Lymphatic: Aortic atherosclerosis. No aneurysm. Upper
abdominal vascularity is patent. No adenopathy identified within the
abdomen or pelvis.

Reproductive: Status post hysterectomy.  No adnexal mass.

Other: No ascites or focal fluid collections.

Musculoskeletal: No acute or significant osseous findings.
IMPRESSION: 1. Diffuse liver metastases.
2. Signs of pleural spread of tumor within the left hemithorax with
small partially loculated left pleural effusion as well as multiple
pleural based soft tissue nodules overlying the visualized portions
of the left lung base. Recommend further evaluation of the chest
with dedicated contrast enhanced CT of the chest.
3. Enlarged cardiophrenic angle lymph nodes compatible with nodal
metastasis.
4. Status post right hemicolectomy with enterocolonic anastomosis.
5. Aortic Atherosclerosis ([C4]-[C4]).
6. These results were called by telephone at the time of
interpretation on [DATE] at [DATE] to provider LABELLE SALHA
, who verbally acknowledged these results.

## 2021-10-18 MED ORDER — IOHEXOL 300 MG/ML  SOLN
60.0000 mL | Freq: Once | INTRAMUSCULAR | Status: AC | PRN
  Administered 2021-10-18: 60 mL via INTRAVENOUS

## 2021-10-18 MED ORDER — IOHEXOL 300 MG/ML  SOLN
100.0000 mL | Freq: Once | INTRAMUSCULAR | Status: AC | PRN
  Administered 2021-10-18: 100 mL via INTRAVENOUS

## 2021-10-18 NOTE — Telephone Encounter (Signed)
FYI they added a CT chest  just in      IMPRESSION: 1. Pulmonary mass with extensive interstitial disease, mediastinal adenopathy and pleural metastases, most suspicious for bronchogenic neoplasm. 2. Diffusely nodular and heterogeneous liver show signs of metastatic disease that are better seen on the CT of the abdomen and pelvis. 3. Aortic atherosclerosis.   Aortic Atherosclerosis (ICD10-I70.0).     Electronically Signed   By: Zetta Bills M.D.   On: 10/18/2021 14:46    Patient contacted and accepted appointment for Monday. Steffanie Dunn S has left message for Sharyn Lull at [facility] regarding appointment being scheduled

## 2021-10-18 NOTE — Telephone Encounter (Signed)
Message left with Sharyn Lull at [facility] internal medication that her message was received and appointments have been arranged for patient.

## 2021-10-18 NOTE — Telephone Encounter (Signed)
Pt has appt on Monday. With labs. Pt agreeable to the date and time

## 2021-10-18 NOTE — Telephone Encounter (Signed)
Call from [facility] with patient PCP office requesting appointment for this patient to be seen again after CT results they got  Sharyn Lull requests a return call [phone]  IMPRESSION: 1. Diffuse liver metastases. 2. Signs of pleural spread of tumor within the left hemithorax with small partially loculated left pleural effusion as well as multiple pleural based soft tissue nodules overlying the visualized portions of the left lung base. Recommend further evaluation of the chest with dedicated contrast enhanced CT of the chest. 3. Enlarged cardiophrenic angle lymph nodes compatible with nodal metastasis. 4. Status post right hemicolectomy with enterocolonic anastomosis. 5. Aortic Atherosclerosis (ICD10-I70.0). 6. These results were called by telephone at the time of interpretation on 10/18/2021 at 1:12 pm to provider [facility] , who verbally acknowledged these results.     Electronically Signed   By: Kerby Moors M.D.   On: 10/18/2021 13:13

## 2021-10-22 ENCOUNTER — Ambulatory Visit: Payer: BC Managed Care – PPO | Admitting: Oncology

## 2021-10-22 ENCOUNTER — Inpatient Hospital Stay (HOSPITAL_BASED_OUTPATIENT_CLINIC_OR_DEPARTMENT_OTHER): Payer: BC Managed Care – PPO | Admitting: Oncology

## 2021-10-22 ENCOUNTER — Other Ambulatory Visit: Payer: Self-pay

## 2021-10-22 ENCOUNTER — Inpatient Hospital Stay: Payer: BC Managed Care – PPO | Attending: Oncology

## 2021-10-22 ENCOUNTER — Other Ambulatory Visit: Payer: Self-pay | Admitting: *Deleted

## 2021-10-22 ENCOUNTER — Encounter: Payer: Self-pay | Admitting: Oncology

## 2021-10-22 ENCOUNTER — Other Ambulatory Visit: Payer: BC Managed Care – PPO

## 2021-10-22 ENCOUNTER — Encounter: Payer: Self-pay | Admitting: *Deleted

## 2021-10-22 VITALS — BP 92/52 | HR 84 | Temp 98.7°F | Wt 203.4 lb

## 2021-10-22 DIAGNOSIS — C3492 Malignant neoplasm of unspecified part of left bronchus or lung: Secondary | ICD-10-CM | POA: Diagnosis not present

## 2021-10-22 DIAGNOSIS — R945 Abnormal results of liver function studies: Secondary | ICD-10-CM | POA: Diagnosis not present

## 2021-10-22 DIAGNOSIS — G893 Neoplasm related pain (acute) (chronic): Secondary | ICD-10-CM | POA: Insufficient documentation

## 2021-10-22 DIAGNOSIS — R222 Localized swelling, mass and lump, trunk: Secondary | ICD-10-CM | POA: Diagnosis not present

## 2021-10-22 DIAGNOSIS — C787 Secondary malignant neoplasm of liver and intrahepatic bile duct: Secondary | ICD-10-CM | POA: Diagnosis not present

## 2021-10-22 DIAGNOSIS — E883 Tumor lysis syndrome: Secondary | ICD-10-CM | POA: Insufficient documentation

## 2021-10-22 DIAGNOSIS — Z7189 Other specified counseling: Secondary | ICD-10-CM | POA: Diagnosis not present

## 2021-10-22 DIAGNOSIS — R918 Other nonspecific abnormal finding of lung field: Secondary | ICD-10-CM | POA: Diagnosis not present

## 2021-10-22 DIAGNOSIS — Z87891 Personal history of nicotine dependence: Secondary | ICD-10-CM | POA: Diagnosis not present

## 2021-10-22 DIAGNOSIS — R160 Hepatomegaly, not elsewhere classified: Secondary | ICD-10-CM

## 2021-10-22 DIAGNOSIS — Z803 Family history of malignant neoplasm of breast: Secondary | ICD-10-CM | POA: Diagnosis not present

## 2021-10-22 DIAGNOSIS — I10 Essential (primary) hypertension: Secondary | ICD-10-CM | POA: Diagnosis not present

## 2021-10-22 DIAGNOSIS — E119 Type 2 diabetes mellitus without complications: Secondary | ICD-10-CM | POA: Diagnosis not present

## 2021-10-22 DIAGNOSIS — R935 Abnormal findings on diagnostic imaging of other abdominal regions, including retroperitoneum: Secondary | ICD-10-CM | POA: Diagnosis not present

## 2021-10-22 DIAGNOSIS — E611 Iron deficiency: Secondary | ICD-10-CM

## 2021-10-22 DIAGNOSIS — E790 Hyperuricemia without signs of inflammatory arthritis and tophaceous disease: Secondary | ICD-10-CM | POA: Insufficient documentation

## 2021-10-22 LAB — CBC
HCT: 36.5 % (ref 36.0–46.0)
Hemoglobin: 11.8 g/dL — ABNORMAL LOW (ref 12.0–15.0)
MCH: 28.7 pg (ref 26.0–34.0)
MCHC: 32.3 g/dL (ref 30.0–36.0)
MCV: 88.8 fL (ref 80.0–100.0)
Platelets: 345 10*3/uL (ref 150–400)
RBC: 4.11 MIL/uL (ref 3.87–5.11)
RDW: 14.9 % (ref 11.5–15.5)
WBC: 12.5 10*3/uL — ABNORMAL HIGH (ref 4.0–10.5)
nRBC: 0 % (ref 0.0–0.2)

## 2021-10-22 LAB — IRON AND TIBC
Iron: 56 ug/dL (ref 28–170)
Saturation Ratios: 15 % (ref 10.4–31.8)
TIBC: 378 ug/dL (ref 250–450)
UIBC: 322 ug/dL

## 2021-10-22 LAB — FERRITIN: Ferritin: 155 ng/mL (ref 11–307)

## 2021-10-22 MED ORDER — OXYCODONE HCL 5 MG PO TABS
5.0000 mg | ORAL_TABLET | Freq: Four times a day (QID) | ORAL | 0 refills | Status: DC | PRN
Start: 2021-10-22 — End: 2021-11-02

## 2021-10-22 NOTE — Progress Notes (Signed)
Met with patient during follow up visit with Dr. Janese Banks to discuss recent imaging findings. All questions answered during visit. Reviewed upcoming appts. Contact info given and instructed to call with any questions or needs. Pt verbalized understanding.

## 2021-10-22 NOTE — Progress Notes (Signed)
Patient states her pain in her back has been keeping her up in the nights. Patient states she is more short of  breath with longer distance In walking.

## 2021-10-22 NOTE — Progress Notes (Signed)
Hematology/Oncology Consult note [facility]  [phone] [phone]  Patient Care Team: Marinda Elk, MD as PCP - General (Physician Assistant) Telford Nab, RN as Oncology Nurse Navigator   Name of the patient: Pamela [name]  [id number]  May 06, 1961   Date of visit: 10/22/21  Diagnosis-lung mass/liver and pleural metastases   chief complaint/ Reason for visit-discuss CT scan results and further management  Heme/Onc history: Patient is a 61 year old female who was seen by me in July 2022 for neutrophilia.  She has been now referred for abnormal CT scan findings.  She has been having ongoing left chest wall pain which prompted a CT chest abdomen and pelvis.  CT chest shows left lower lobe lung mass with extensive mediastinal adenopathy as well as pleural metastases.  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  Patient has a history of colon cancer treated with surgery and adjuvant chemotherapy back in 2003.  She also has a history of uterine cancer in 2007 s/p hysterectomy and bilateral salpingo-oophorectomy.   Interval history- Patient currently reports she is taking Tylenol for her left chest wall pain which has not been helping.  She has smoked about half a pack of cigarettes per day in the past for over 40 years and quit smoking recently.  She currently reports about 20 pound weight loss in the last 2 months.  ECOG PS- 1 Pain scale- 6   Review of systems- Review of Systems  Respiratory:         Left chest wall pain      Allergies  Allergen Reactions   Rofecoxib Swelling     Past Medical History:  Diagnosis Date   Colon cancer ([place]) 2003   Diabetes mellitus without complication ([place])    Hypertension    Personal history of chemotherapy 2003   colon cancer   Uterine cancer ([place]) 2007     Past Surgical History:  Procedure Laterality Date   ABDOMINAL HYSTERECTOMY     BREAST EXCISIONAL  BIOPSY Right 11/26/2010   neg/- PROLIFERATIVE FIBROCYSTIC CHANGE WITH ASSOCIATED    CHOLECYSTECTOMY     COLON SURGERY     2003   COLONOSCOPY     COLONOSCOPY WITH PROPOFOL N/A 12/11/2018   Procedure: COLONOSCOPY WITH PROPOFOL;  Surgeon: Lollie Sails, MD;  Location: [facility] ENDOSCOPY;  Service: Endoscopy;  Laterality: N/A;    Social History   Socioeconomic History   Marital status: Married    Spouse name: Not on file   Number of children: Not on file   Years of education: Not on file   Highest education level: Not on file  Occupational History   Not on file  Tobacco Use   Smoking status: Every Day    Packs/day: 1.00    Years: 43.00    Pack years: 43.00    Types: Cigarettes   Smokeless tobacco: Never   Tobacco comments:    1 to 2 cigarettes per day now  Vaping Use   Vaping Use: Never used  Substance and Sexual Activity   Alcohol use: Not Currently   Drug use: Never   Sexual activity: Not on file  Other Topics Concern   Not on file  Social History Narrative   Not on file   Social Determinants of Health   Financial Resource Strain: Not on file  Food Insecurity: Not on file  Transportation Needs: Not on file  Physical Activity: Not on file  Stress: Not on file  Social Connections: Not on file  Intimate Partner Violence: Not on file    Family History  Problem Relation Age of Onset   Breast cancer Maternal Aunt 60   Breast cancer Paternal Aunt 13   Breast cancer Cousin 18   Breast cancer Cousin 32     Current Outpatient Medications:    albuterol (VENTOLIN HFA) 108 (90 Base) MCG/ACT inhaler, SMARTSIG:2 Inhalation Via Inhaler Every 6 Hours PRN, Disp: , Rfl:    diclofenac sodium (VOLTAREN) 1 % GEL, Apply topically 2 (two) times daily., Disp: , Rfl:    esomeprazole (NEXIUM) 20 MG capsule, Take 20 mg by mouth daily at 12 noon., Disp: , Rfl:    ferrous sulfate 325 (65 FE) MG EC tablet, Take 325 mg by mouth daily., Disp: , Rfl:    JANUMET 50-500 MG tablet, Take 1  tablet by mouth 2 (two) times daily., Disp: , Rfl:    levothyroxine (SYNTHROID, LEVOTHROID) 50 MCG tablet, Take 50 mcg by mouth daily before breakfast., Disp: , Rfl:    lisinopril-hydrochlorothiazide (PRINZIDE,ZESTORETIC) 10-12.5 MG tablet, Take 1 tablet by mouth daily., Disp: , Rfl:    magnesium oxide (MAG-OX) 400 MG tablet, Take 500 mg by mouth daily., Disp: , Rfl:    Multiple Vitamin (MULTIVITAMIN) tablet, Take 1 tablet by mouth daily., Disp: , Rfl:    Semaglutide,0.25 or 0.5MG /DOS, 2 MG/1.5ML SOPN, Inject into the skin., Disp: , Rfl:    simvastatin (ZOCOR) 40 MG tablet, Take 40 mg by mouth daily., Disp: , Rfl:    tiZANidine (ZANAFLEX) 2 MG tablet, Take by mouth every 8 (eight) hours as needed for muscle spasms., Disp: , Rfl:    oxyCODONE (OXY IR/ROXICODONE) 5 MG immediate release tablet, Take 1 tablet (5 mg total) by mouth every 6 (six) hours as needed for severe pain., Disp: 30 tablet, Rfl: 0  Physical exam:  Vitals:   10/22/21 1015  BP: (!) 92/52  Pulse: 84  Temp: 98.7 F (37.1 C)  SpO2: (!) 18%  Weight: 203 lb 6.4 oz (92.3 kg)  PF: 97 L/min   Physical Exam Constitutional:      General: She is not in acute distress. Cardiovascular:     Rate and Rhythm: Normal rate and regular rhythm.     Heart sounds: Normal heart sounds.  Pulmonary:     Effort: Pulmonary effort is normal.     Breath sounds: Normal breath sounds.  Abdominal:     General: Bowel sounds are normal.     Palpations: Abdomen is soft.     Comments: Palpable hepatomegaly  Skin:    General: Skin is warm and dry.  Neurological:     Mental Status: She is alert and oriented to person, place, and time.     No flowsheet data found. CBC Latest Ref Rng & Units 10/22/2021  WBC 4.0 - 10.5 K/uL 12.5(H)  Hemoglobin 12.0 - 15.0 g/dL 11.8(L)  Hematocrit 36.0 - 46.0 % 36.5  Platelets 150 - 400 K/uL 345    No images are attached to the encounter.  CT CHEST W CONTRAST  Result Date: 10/18/2021 CLINICAL DATA:  Abnormal CT  of the abdomen and pelvis for which further evaluation with chest CT was suggested. EXAM: CT CHEST WITH CONTRAST TECHNIQUE: Multidetector CT imaging of the chest was performed during intravenous contrast administration. RADIATION DOSE REDUCTION: This exam was performed according to the departmental dose-optimization program which includes automated exposure control, adjustment of the mA and/or kV according to patient size and/or use of iterative  reconstruction technique. CONTRAST:  68mL OMNIPAQUE IOHEXOL 300 MG/ML  SOLN COMPARISON:  Comparison made with abdominal CT of the same date. Also with chest CT from May of 2021. FINDINGS: Cardiovascular: Calcified atheromatous plaque of the thoracic aorta. No aneurysmal dilation. Heart size is normal. There are numerous pre pericardial lymph nodes with enlargement, see below. Central pulmonary vasculature is remarkable for narrowing due to extensive LEFT hilar adenopathy of the LEFT pulmonary artery. Otherwise not well evaluated on this venous phase. Mediastinum/Nodes: Extensive mediastinal and LEFT hilar adenopathy. LEFT thoracic inlet lymph node 11 mm (image 13/2) LEFT paratracheal lymph node anterior to the esophagus (image 23/2) 14 mm. Low LEFT paratracheal blending into AP window adenopathy (image 50/2) 16 mm. Subcarinal adenopathy, 17 mm (image 60/2) LEFT hilar nodal mass obscuring LEFT pulmonary artery measuring up to 4.0 x 2.6 cm (image 61/2) Pre pericardial nodal enlargement largest anterior to the RIGHT heart (image 98/2) 18 mm. Juxta esophageal lymph node with enlargement (image 108/2) 12 mm. No RIGHT hilar adenopathy. Lungs/Pleura: Extensive pleural disease with pleural studding involving the entire LEFT chest, occasional pleural nodules tracking over the LEFT lung apex with most pronounced pleural involvement in the dependent aspect of the chest in the inferior posterior costodiaphragmatic recess. Here pleural involvement invades the fat adjacent to the aorta  (image 111/2) masslike area measuring up to 2.6 x 1.9 cm is contiguous with nodular pleural thickening that extends along the entire posterior LEFT chest tracking to the mid and upper chest mixed with pleural fluid. Discrete nodules such as an area on image 77/2 track along the dependent and lateral greater than the anterior chest towards the LEFT lung apex and are scattered in terms of distribution, the largest area measuring approximately 1.8 x 1.0 cm (image 77/2) Masslike area in the superior segment of the AA LEFT lower lobe measuring 4.3 x 3.6 cm (image 49/2) this is contiguous with nodular masslike soft tissue that extends to the LEFT hilum (image 56/2) pleural nodularity of the major fissure also demonstrated. RIGHT chest is clear. Upper Abdomen: Diffusely nodular and heterogeneous appearance of the liver with variable density throughout the liver and suggestion of masslike areas underlying steatotic changes is not well evaluated, better assessed on the prior CT of the abdomen which shows clear hepatic metastatic disease. Pancreas, visualized portions as well as the spleen without acute process. Adrenal glands are unremarkable. Musculoskeletal: Many of the areas of pleural disease along the LEFT chest appear to involve the extrapleural fat. No acute musculoskeletal process IMPRESSION: 1. Pulmonary mass with extensive interstitial disease, mediastinal adenopathy and pleural metastases, most suspicious for bronchogenic neoplasm. 2. Diffusely nodular and heterogeneous liver show signs of metastatic disease that are better seen on the CT of the abdomen and pelvis. 3. Aortic atherosclerosis. Aortic Atherosclerosis (ICD10-I70.0). Electronically Signed   By: Zetta Bills M.D.   On: 10/18/2021 14:46   CT ABDOMEN PELVIS W CONTRAST  Result Date: 10/18/2021 CLINICAL DATA:  Elevated LFTs and abdominal pain. EXAM: CT ABDOMEN AND PELVIS WITH CONTRAST TECHNIQUE: Multidetector CT imaging of the abdomen and pelvis was  performed using the standard protocol following bolus administration of intravenous contrast. RADIATION DOSE REDUCTION: This exam was performed according to the departmental dose-optimization program which includes automated exposure control, adjustment of the mA and/or kV according to patient size and/or use of iterative reconstruction technique. CONTRAST:  174mL OMNIPAQUE IOHEXOL 300 MG/ML  SOLN COMPARISON:  CT chest 01/19/2020 FINDINGS: Lower chest: Partially loculated scratch set there is a small partially loculated  left pleural effusion identified. Signs of pleural spread of tumor within the left hemithorax identified with multiple pleural based soft tissue nodules overlying the visualized portions of the left lung base. Right lung appears clear. Enlarged right cardiophrenic angle lymph node measures 1.7 cm, image 8/2. Left cardiophrenic angle lymph node is enlarged measuring 1.2 cm, image 14/2. Hepatobiliary: The liver has a pseudo cirrhotic appearance due to diffuse liver metastases: Index lesion within the posterior dome measures 2.8 x 2.8 cm, image 7/2. Posterior lateral right lobe of liver lesion measures 2.1 x 2.3 cm, image 24/2. Inferior right lobe of liver lesion measures 2.8 x 2.5 cm, image 42/2. Index lesion within the lateral segment of left hepatic lobe measures 2.1 by 2.0 cm, image 23/2 Status post cholecystectomy.  No bile duct dilatation. Pancreas: Unremarkable. No pancreatic ductal dilatation or surrounding inflammatory changes. Spleen: Normal in size without focal abnormality. Adrenals/Urinary Tract: Normal adrenal glands. No suspicious mass or hydronephrosis identified bilaterally. Small cyst within inferior pole of right kidney measures 1.5 cm. The urinary bladder is unremarkable. Stomach/Bowel: Stomach is nondistended. Postsurgical changes from right hemicolectomy with enterocolonic anastomosis. No bowel wall thickening, inflammation, or distension. Vascular/Lymphatic: Aortic  atherosclerosis. No aneurysm. Upper abdominal vascularity is patent. No adenopathy identified within the abdomen or pelvis. Reproductive: Status post hysterectomy.  No adnexal mass. Other: No ascites or focal fluid collections. Musculoskeletal: No acute or significant osseous findings. IMPRESSION: 1. Diffuse liver metastases. 2. Signs of pleural spread of tumor within the left hemithorax with small partially loculated left pleural effusion as well as multiple pleural based soft tissue nodules overlying the visualized portions of the left lung base. Recommend further evaluation of the chest with dedicated contrast enhanced CT of the chest. 3. Enlarged cardiophrenic angle lymph nodes compatible with nodal metastasis. 4. Status post right hemicolectomy with enterocolonic anastomosis. 5. Aortic Atherosclerosis (ICD10-I70.0). 6. These results were called by telephone at the time of interpretation on 10/18/2021 at 1:12 pm to provider [facility] , who verbally acknowledged these results. Electronically Signed   By: Kerby Moors M.D.   On: 10/18/2021 13:13     Assessment and plan- Patient is a 61 y.o. female with history of colon cancer in 2003, uterine cancer in 2007 now presenting with left chest wall pain.  She is here to discuss CT scan results and further management  I have reviewed CT chest abdomen pelvis images independently and discussed findings with the patient and her daughter-in-law.  CT scan shows multiple areas of pleural thickening and pleural nodularity as well as left lower lobe lung mass with mediastinal and hilar adenopathy and liver metastases.  Findings are concerning for metastatic lung cancer.  Discussed that this appears to be stage IV disease which is treatable but not curable.  Typically chemotherapy is recommended for stage IV disease.  Unless this turns out to be a lymphoma which could be potentially curative, treatment will be aimed at palliating her symptoms and preventing further  growth of tumor.  We are obtaining CBC CEA and ferritin today.  She did have abnormal LFTs based on labs checked 2 weeks ago likely secondary to liver metastases but her bilirubin and albumin have been normal.  We will repeat LFTs again in 2 weeks.  She is scheduled for ultrasound-guided liver biopsy in 1 week.  I will tentatively see her back in 10 to 12 days time to discuss biopsy results and further management  Neoplasm related pain: Suspect patient's left chest wall pain is secondary to pleural  metastases.  I will prescribe oxycodone 5 mg every 6 hours as needed for the same.  I have also recommended that patient should take MiraLAX and sennaTo prevent opioid-induced constipation.   Visit Diagnosis 1. Pleural nodules   2. Lung mass   3. Liver metastases ([place])      Dr. Randa Evens, MD, MPH [facility] at [facility] [phone] 10/22/2021 12:55 PM

## 2021-10-23 LAB — CEA: CEA: 2.6 ng/mL (ref 0.0–4.7)

## 2021-10-24 ENCOUNTER — Telehealth: Payer: Self-pay | Admitting: *Deleted

## 2021-10-24 NOTE — Telephone Encounter (Signed)
Pt continues to have increased pain not managed with oxycodone 5mg  every 6 hours. Per Dr. Janese Banks, pt may increase oxycodone 5mg  to take 2 tablets every 4 hours as needed. Pt made aware and she verbalized understanding.

## 2021-10-25 NOTE — Addendum Note (Signed)
Addended by: Telford Nab on: 10/25/2021 09:51 AM   Modules accepted: Orders

## 2021-10-26 NOTE — H&P (Signed)
Chief Complaint: Patient was seen in consultation today for liver lesion biopsy or left supraclavicular lymph node biopsy at the request of [place] [facility]  Referring Physician(s): Rao,Archana C  Supervising Physician: Sandi Mariscal  Patient Status: [facility] - Out-pt  History of Present Illness: Pamela [name] is a 61 y.o. female w/ PMH of colon cancer, DM II, HTN and uterine cancer. Pt being followed for neutrophilia, continuing left chest pain. CT chest/abd/pelvis was performed 10/18/21 and found pulmonary mass, mediastinal adenopathy, pleural masses with metastasis to the liver. Pt was referred by IR for liver lesion biopsy. Dr. Anselm Pancoast, IR, approved liver lesion or left supraclavicular lymph node biopsy.   CT chest/abd pelvis: IMPRESSION: 1. Pulmonary mass with extensive interstitial disease, mediastinal adenopathy and pleural metastases, most suspicious for bronchogenic neoplasm. 2. Diffusely nodular and heterogeneous liver show signs of metastatic disease that are better seen on the CT of the abdomen and pelvis. 3. Aortic atherosclerosis. Past Medical History:  Diagnosis Date   Colon cancer ([place]) 2003   Diabetes mellitus without complication ([place])    Hypertension    Personal history of chemotherapy 2003   colon cancer   Uterine cancer ([place]) 2007    Past Surgical History:  Procedure Laterality Date   ABDOMINAL HYSTERECTOMY     BREAST EXCISIONAL BIOPSY Right 11/26/2010   neg/- PROLIFERATIVE FIBROCYSTIC CHANGE WITH ASSOCIATED    CHOLECYSTECTOMY     COLON SURGERY     2003   COLONOSCOPY     COLONOSCOPY WITH PROPOFOL N/A 12/11/2018   Procedure: COLONOSCOPY WITH PROPOFOL;  Surgeon: Lollie Sails, MD;  [facility];  Service: Endoscopy;  Laterality: N/A;    Allergies: Rofecoxib  Medications: Prior to Admission medications   Medication Sig Start Date End Date Taking? Authorizing Provider  albuterol (VENTOLIN HFA) 108 (90 Base) MCG/ACT inhaler SMARTSIG:2  Inhalation Via Inhaler Every 6 Hours PRN 01/22/21   [provider]  diclofenac sodium (VOLTAREN) 1 % GEL Apply topically 2 (two) times daily.    [provider]  esomeprazole (NEXIUM) 20 MG capsule Take 20 mg by mouth daily at 12 noon.    [provider]  ferrous sulfate 325 (65 FE) MG EC tablet Take 325 mg by mouth daily.    [provider]  JANUMET 50-500 MG tablet Take 1 tablet by mouth 2 (two) times daily. 12/18/20   [provider]  levothyroxine (SYNTHROID, LEVOTHROID) 50 MCG tablet Take 50 mcg by mouth daily before breakfast.    [provider]  lisinopril-hydrochlorothiazide (PRINZIDE,ZESTORETIC) 10-12.5 MG tablet Take 1 tablet by mouth daily.    [provider]  magnesium oxide (MAG-OX) 400 MG tablet Take 500 mg by mouth daily.    [provider]  Multiple Vitamin (MULTIVITAMIN) tablet Take 1 tablet by mouth daily.    [provider]  oxyCODONE (OXY IR/ROXICODONE) 5 MG immediate release tablet Take 1 tablet (5 mg total) by mouth every 6 (six) hours as needed for severe pain. Patient taking differently: Take 10 mg by mouth every 4 (four) hours as needed for severe pain. 10/22/21   Sindy Guadeloupe, MD  Semaglutide,0.25 or 0.5MG /DOS, 2 MG/1.5ML SOPN Inject into the skin. 07/25/21 11/17/21  [provider]  simvastatin (ZOCOR) 40 MG tablet Take 40 mg by mouth daily.    [provider]  tiZANidine (ZANAFLEX) 2 MG tablet Take by mouth every 8 (eight) hours as needed for muscle spasms.    [provider]     Family History  Problem Relation Age of Onset   Breast cancer Maternal Aunt 60   Breast cancer Paternal Aunt 65   Breast cancer Cousin 18   Breast cancer Cousin 19    Social History   Socioeconomic History   Marital status: Married    Spouse name: Not on file   Number of children: Not on file   Years of education: Not on file   Highest education level: Not on file  Occupational  History   Not on file  Tobacco Use   Smoking status: Former    Packs/day: 1.00    Years: 43.00    Pack years: 43.00    Types: Cigarettes    Quit date: 10/17/2021    Years since quitting: 0.0   Smokeless tobacco: Never   Tobacco comments:    1 to 2 cigarettes per day now  Vaping Use   Vaping Use: Never used  Substance and Sexual Activity   Alcohol use: Not Currently   Drug use: Never   Sexual activity: Not on file  Other Topics Concern   Not on file  Social History Narrative   Not on file   Social Determinants of Health   Financial Resource Strain: Not on file  Food Insecurity: Not on file  Transportation Needs: Not on file  Physical Activity: Not on file  Stress: Not on file  Social Connections: Not on file    Review of Systems: A 12 point ROS discussed and pertinent positives are indicated in the HPI above.  All other systems are negative.  Review of Systems  Constitutional:  Negative for chills and fever.  HENT:  Negative for nosebleeds.   Eyes:  Negative for visual disturbance.  Respiratory:  Positive for shortness of breath. Negative for cough.   Cardiovascular:  Negative for chest pain and leg swelling.  Gastrointestinal:  Positive for abdominal pain, nausea and vomiting. Negative for blood in stool.  Genitourinary:  Negative for hematuria.  Neurological:  Negative for dizziness and headaches.   Vital Signs: BP (!) 87/49    Pulse 80    Temp 97.7 F (36.5 C) (Oral)    Resp 13    Ht 5\' 2"  (1.575 m)    Wt 201 lb (91.2 kg)    SpO2 92%    BMI 36.76 kg/m   Physical Exam Constitutional:      Appearance: Normal appearance. She is not ill-appearing.  HENT:     Head: Normocephalic and atraumatic.     Mouth/Throat:     Mouth: Mucous membranes are moist.     Pharynx: Oropharynx is clear.  Eyes:     Extraocular Movements: Extraocular movements intact.     Pupils: Pupils are equal, round, and reactive to light.  Cardiovascular:     Rate and Rhythm: Normal rate and  regular rhythm.     Pulses: Normal pulses.     Heart sounds: Normal heart sounds. No murmur heard.   No friction rub. No gallop.  Pulmonary:     Effort: Pulmonary effort is normal. No respiratory distress.     Breath sounds: Normal breath sounds. No stridor. No wheezing, rhonchi or rales.  Abdominal:     General: Bowel sounds are normal. There is no distension.     Tenderness: There is no abdominal tenderness. There is no guarding.  Musculoskeletal:     Right lower leg: No edema.     Left lower leg: No edema.  Skin:    General: Skin is warm and dry.  Neurological:     Mental Status: She is alert and oriented to person, place, and time.  Psychiatric:        Mood and Affect: Mood normal.        Behavior: Behavior normal.        Thought Content: Thought content normal.        Judgment: Judgment normal.    Imaging: CT CHEST W CONTRAST  Result Date: 10/18/2021 CLINICAL DATA:  Abnormal CT of the abdomen and pelvis for which further evaluation with chest CT was suggested. EXAM: CT CHEST WITH CONTRAST TECHNIQUE: Multidetector CT imaging of the chest was performed during intravenous contrast administration. RADIATION DOSE REDUCTION: This exam was performed according to the departmental dose-optimization program which includes automated exposure control, adjustment of the mA and/or kV according to patient size and/or use of iterative reconstruction technique. CONTRAST:  67mL OMNIPAQUE IOHEXOL 300 MG/ML  SOLN COMPARISON:  Comparison made with abdominal CT of the same date. Also with chest CT from May of 2021. FINDINGS: Cardiovascular: Calcified atheromatous plaque of the thoracic aorta. No aneurysmal dilation. Heart size is normal. There are numerous pre pericardial lymph nodes with enlargement, see below. Central pulmonary vasculature is remarkable for narrowing due to extensive LEFT hilar adenopathy of the LEFT pulmonary artery. Otherwise not well evaluated on this venous phase. Mediastinum/Nodes:  Extensive mediastinal and LEFT hilar adenopathy. LEFT thoracic inlet lymph node 11 mm (image 13/2) LEFT paratracheal lymph node anterior to the esophagus (image 23/2) 14 mm. Low LEFT paratracheal blending into AP window adenopathy (image 50/2) 16 mm. Subcarinal adenopathy, 17 mm (image 60/2) LEFT hilar nodal mass obscuring LEFT pulmonary artery measuring up to 4.0 x 2.6 cm (image 61/2) Pre pericardial nodal enlargement largest anterior to the RIGHT heart (image 98/2) 18 mm. Juxta esophageal lymph node with enlargement (image 108/2) 12 mm. No RIGHT hilar adenopathy. Lungs/Pleura: Extensive pleural disease with pleural studding involving the entire LEFT chest, occasional pleural nodules tracking over the LEFT lung apex with most pronounced pleural involvement in the dependent aspect of the chest in the inferior posterior costodiaphragmatic recess. Here pleural involvement invades the fat adjacent to the aorta (image 111/2) masslike area measuring up to 2.6 x 1.9 cm is contiguous with nodular pleural thickening that extends along the entire posterior LEFT chest tracking to the mid and upper chest mixed with pleural fluid. Discrete nodules such as an area on image 77/2 track along the dependent and lateral greater than the anterior chest towards the LEFT lung apex and are scattered in terms of distribution, the largest area measuring approximately 1.8 x 1.0 cm (image 77/2) Masslike area in the superior segment of the AA LEFT lower lobe measuring 4.3 x 3.6 cm (image 49/2) this is contiguous with nodular masslike soft tissue that extends to the LEFT hilum (image 56/2) pleural nodularity of the major fissure also demonstrated. RIGHT chest is clear. Upper Abdomen: Diffusely nodular and heterogeneous appearance of the liver with variable density throughout the liver and suggestion of masslike areas underlying steatotic changes is not well evaluated, better assessed on the prior CT of the abdomen which shows clear hepatic  metastatic disease. Pancreas, visualized portions as well as the spleen without acute process. Adrenal glands are unremarkable. Musculoskeletal: Many of the areas of pleural disease along the LEFT chest appear to involve the extrapleural fat. No acute musculoskeletal process IMPRESSION: 1. Pulmonary mass with extensive interstitial disease, mediastinal adenopathy and pleural metastases, most suspicious for bronchogenic neoplasm. 2. Diffusely nodular and heterogeneous liver show  signs of metastatic disease that are better seen on the CT of the abdomen and pelvis. 3. Aortic atherosclerosis. Aortic Atherosclerosis (ICD10-I70.0). Electronically Signed   By: Zetta Bills M.D.   On: 10/18/2021 14:46   CT ABDOMEN PELVIS W CONTRAST  Result Date: 10/18/2021 CLINICAL DATA:  Elevated LFTs and abdominal pain. EXAM: CT ABDOMEN AND PELVIS WITH CONTRAST TECHNIQUE: Multidetector CT imaging of the abdomen and pelvis was performed using the standard protocol following bolus administration of intravenous contrast. RADIATION DOSE REDUCTION: This exam was performed according to the departmental dose-optimization program which includes automated exposure control, adjustment of the mA and/or kV according to patient size and/or use of iterative reconstruction technique. CONTRAST:  147mL OMNIPAQUE IOHEXOL 300 MG/ML  SOLN COMPARISON:  CT chest 01/19/2020 FINDINGS: Lower chest: Partially loculated scratch set there is a small partially loculated left pleural effusion identified. Signs of pleural spread of tumor within the left hemithorax identified with multiple pleural based soft tissue nodules overlying the visualized portions of the left lung base. Right lung appears clear. Enlarged right cardiophrenic angle lymph node measures 1.7 cm, image 8/2. Left cardiophrenic angle lymph node is enlarged measuring 1.2 cm, image 14/2. Hepatobiliary: The liver has a pseudo cirrhotic appearance due to diffuse liver metastases: Index lesion within  the posterior dome measures 2.8 x 2.8 cm, image 7/2. Posterior lateral right lobe of liver lesion measures 2.1 x 2.3 cm, image 24/2. Inferior right lobe of liver lesion measures 2.8 x 2.5 cm, image 42/2. Index lesion within the lateral segment of left hepatic lobe measures 2.1 by 2.0 cm, image 23/2 Status post cholecystectomy.  No bile duct dilatation. Pancreas: Unremarkable. No pancreatic ductal dilatation or surrounding inflammatory changes. Spleen: Normal in size without focal abnormality. Adrenals/Urinary Tract: Normal adrenal glands. No suspicious mass or hydronephrosis identified bilaterally. Small cyst within inferior pole of right kidney measures 1.5 cm. The urinary bladder is unremarkable. Stomach/Bowel: Stomach is nondistended. Postsurgical changes from right hemicolectomy with enterocolonic anastomosis. No bowel wall thickening, inflammation, or distension. Vascular/Lymphatic: Aortic atherosclerosis. No aneurysm. Upper abdominal vascularity is patent. No adenopathy identified within the abdomen or pelvis. Reproductive: Status post hysterectomy.  No adnexal mass. Other: No ascites or focal fluid collections. Musculoskeletal: No acute or significant osseous findings. IMPRESSION: 1. Diffuse liver metastases. 2. Signs of pleural spread of tumor within the left hemithorax with small partially loculated left pleural effusion as well as multiple pleural based soft tissue nodules overlying the visualized portions of the left lung base. Recommend further evaluation of the chest with dedicated contrast enhanced CT of the chest. 3. Enlarged cardiophrenic angle lymph nodes compatible with nodal metastasis. 4. Status post right hemicolectomy with enterocolonic anastomosis. 5. Aortic Atherosclerosis (ICD10-I70.0). 6. These results were called by telephone at the time of interpretation on 10/18/2021 at 1:12 pm to provider [facility] , who verbally acknowledged these results. Electronically Signed   By: Kerby Moors M.D.   On: 10/18/2021 13:13    Labs:  CBC: Recent Labs    04/13/21 1055 10/22/21 0955 10/29/21 0754  WBC 10.1 12.5* 13.5*  HGB 12.2 11.8* 11.3*  HCT 38.9 36.5 35.1*  PLT 313 345 403*    COAGS: No results for input(s): INR, APTT in the last 8760 hours.  BMP: No results for input(s): NA, K, CL, CO2, GLUCOSE, BUN, CALCIUM, CREATININE, GFRNONAA, GFRAA in the last 8760 hours.  Invalid input(s): CMP  LIVER FUNCTION TESTS: No results for input(s): BILITOT, AST, ALT, ALKPHOS, PROT, ALBUMIN in the last 8760 hours.  TUMOR MARKERS: No results for input(s): AFPTM, CEA, CA199, CHROMGRNA in the last 8760 hours.  Assessment and Plan: History of colon cancer, DM II, HTN and uterine cancer. Pt being followed for neutrophilia, continuing left chest pain. CT chest/abd/pelvis was performed 10/18/21 and found pulmonary mass, mediastinal adenopathy, pleural masses with metastasis to the liver. Pt was referred by IR for liver lesion biopsy. Dr. Anselm Pancoast, IR, approved liver lesion or left supraclavicular lymph node biopsy.   CT chest/abd pelvis: IMPRESSION: 1. Pulmonary mass with extensive interstitial disease, mediastinal adenopathy and pleural metastases, most suspicious for bronchogenic neoplasm. 2. Diffusely nodular and heterogeneous liver show signs of metastatic disease that are better seen on the CT of the abdomen and pelvis. 3. Aortic atherosclerosis.  Pt sitting upright on stretcher. She is A&O, calm and pleasant. She is in no distress. Granddaughter at bedside.  She states she is NPO per order.  She denies the use of blood thinning medications.    Risks and benefits of liver lesion/ left supraclavicular lymph node biopsy was discussed with the patient and/or patient's family including, but not limited to bleeding, infection, damage to adjacent structures or low yield requiring additional tests.   All of the questions were answered and there is agreement to proceed.  Consent  signed and in chart.   Thank you for this interesting consult.  I greatly enjoyed meeting Pamela [name] and look forward to participating in their care.  A copy of this report was sent to the requesting provider on this date.  Electronically Signed: Tyson Alias, NP 10/29/2021, 8:12 AM   I spent a total of 20 minutes in face to face in clinical consultation, greater than 50% of which was counseling/coordinating care for liver lesion biopsy or left supraclavicular biopsy.

## 2021-10-29 ENCOUNTER — Ambulatory Visit
Admission: RE | Admit: 2021-10-29 | Discharge: 2021-10-29 | Disposition: A | Discharge status: Home / Self Care | Payer: BC Managed Care – PPO | Source: Ambulatory Visit | Attending: Oncology | Admitting: Oncology

## 2021-10-29 DIAGNOSIS — Z8542 Personal history of malignant neoplasm of other parts of uterus: Secondary | ICD-10-CM | POA: Insufficient documentation

## 2021-10-29 DIAGNOSIS — E119 Type 2 diabetes mellitus without complications: Secondary | ICD-10-CM | POA: Diagnosis not present

## 2021-10-29 DIAGNOSIS — R160 Hepatomegaly, not elsewhere classified: Secondary | ICD-10-CM

## 2021-10-29 DIAGNOSIS — C787 Secondary malignant neoplasm of liver and intrahepatic bile duct: Secondary | ICD-10-CM | POA: Diagnosis not present

## 2021-10-29 DIAGNOSIS — I10 Essential (primary) hypertension: Secondary | ICD-10-CM | POA: Insufficient documentation

## 2021-10-29 DIAGNOSIS — R935 Abnormal findings on diagnostic imaging of other abdominal regions, including retroperitoneum: Secondary | ICD-10-CM | POA: Diagnosis not present

## 2021-10-29 DIAGNOSIS — Z85038 Personal history of other malignant neoplasm of large intestine: Secondary | ICD-10-CM | POA: Diagnosis not present

## 2021-10-29 DIAGNOSIS — E611 Iron deficiency: Secondary | ICD-10-CM | POA: Diagnosis not present

## 2021-10-29 LAB — CBC
HCT: 35.1 % — ABNORMAL LOW (ref 36.0–46.0)
Hemoglobin: 11.3 g/dL — ABNORMAL LOW (ref 12.0–15.0)
MCH: 29.3 pg (ref 26.0–34.0)
MCHC: 32.2 g/dL (ref 30.0–36.0)
MCV: 90.9 fL (ref 80.0–100.0)
Platelets: 403 10*3/uL — ABNORMAL HIGH (ref 150–400)
RBC: 3.86 MIL/uL — ABNORMAL LOW (ref 3.87–5.11)
RDW: 15.3 % (ref 11.5–15.5)
WBC: 13.5 10*3/uL — ABNORMAL HIGH (ref 4.0–10.5)
nRBC: 0 % (ref 0.0–0.2)

## 2021-10-29 LAB — GLUCOSE, CAPILLARY: Glucose-Capillary: 115 mg/dL — ABNORMAL HIGH (ref 70–99)

## 2021-10-29 LAB — PROTIME-INR
INR: 1.2 (ref 0.8–1.2)
Prothrombin Time: 15.4 seconds — ABNORMAL HIGH (ref 11.4–15.2)

## 2021-10-29 IMAGING — US US BIOPSY CORE LIVER
1 series · 13 of 22 positions shown · non-contrast
Comparison: CT of the chest, abdomen pelvis-[DATE]

INDICATION: History of colon and uterine cancer now with liver lesions worrisome
for metastatic disease. Please perform ultrasound-guided biopsy for
tissue diagnostic purposes.

EXAM:
ULTRASOUND GUIDED LIVER LESION BIOPSY

[Series 1: us biopsy core liver · 0.28mm/px · 13 of 22 slices shown]
[im 1/22]
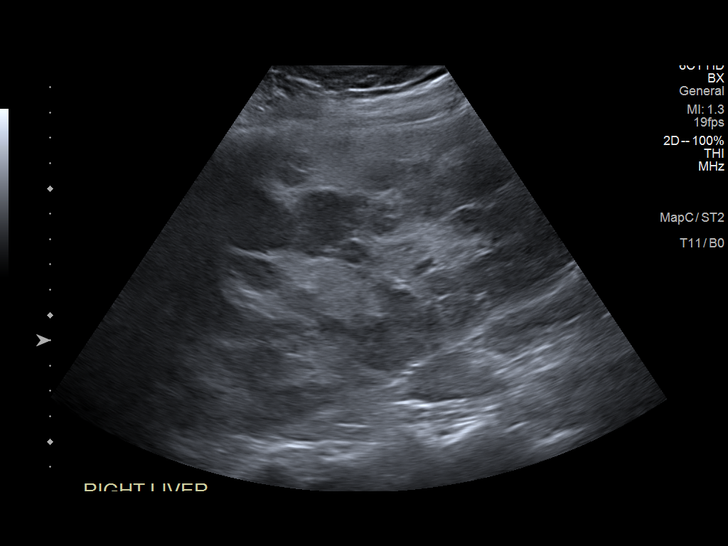
[im 3/22]
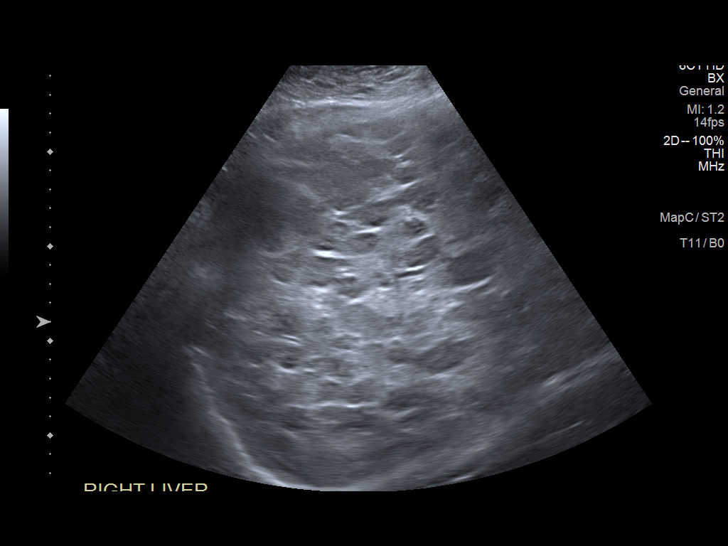
[im 5/22]
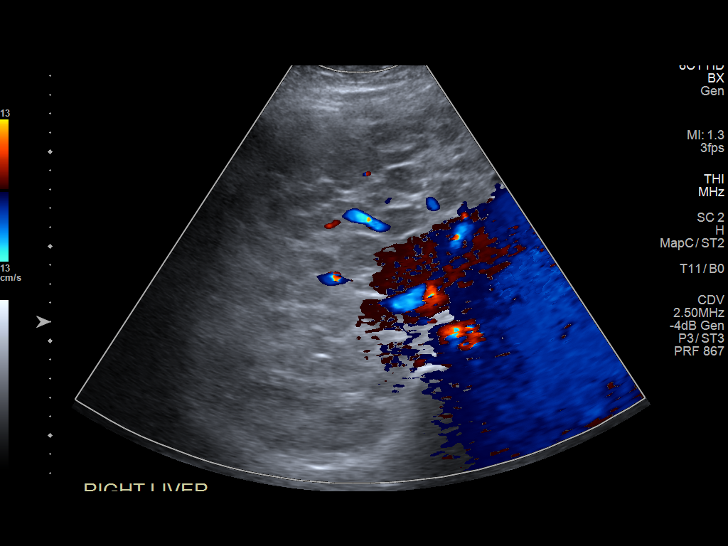
[im 6/22]
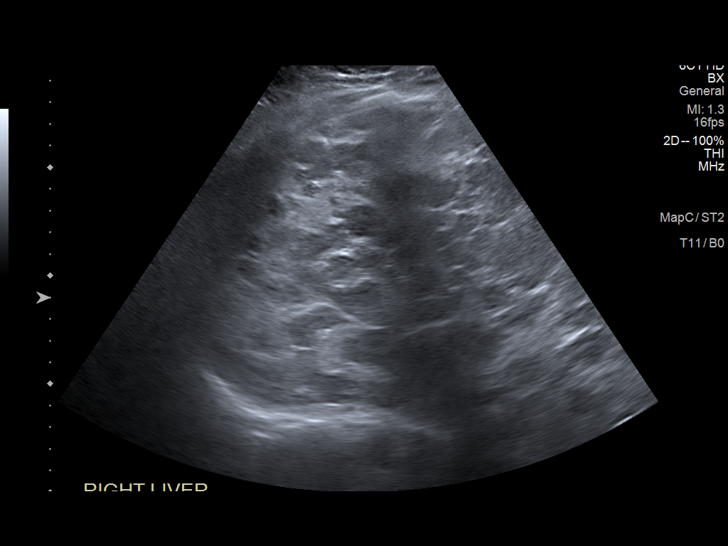
[im 8/22]
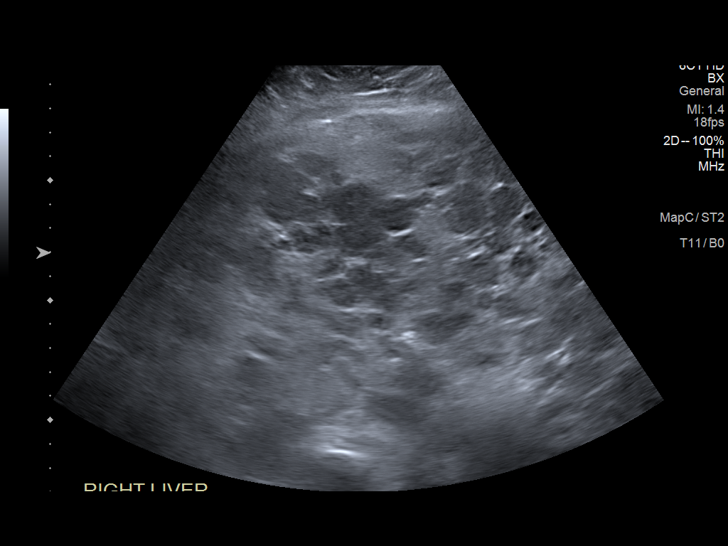
[im 10/22]
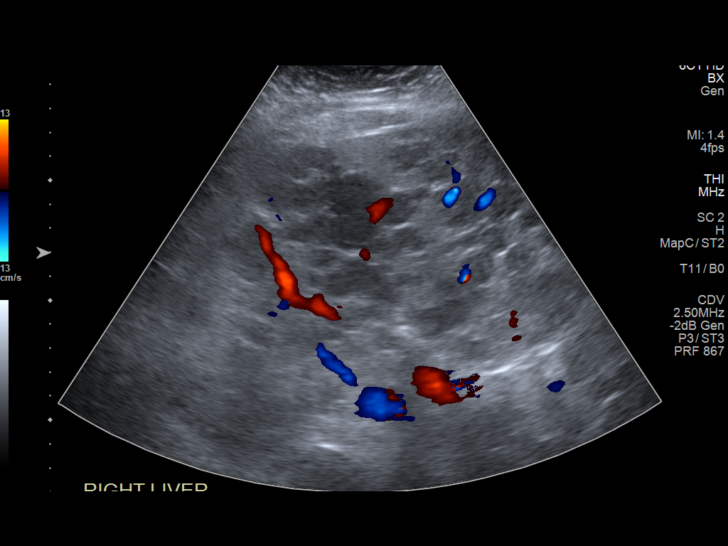
[im 12/22]
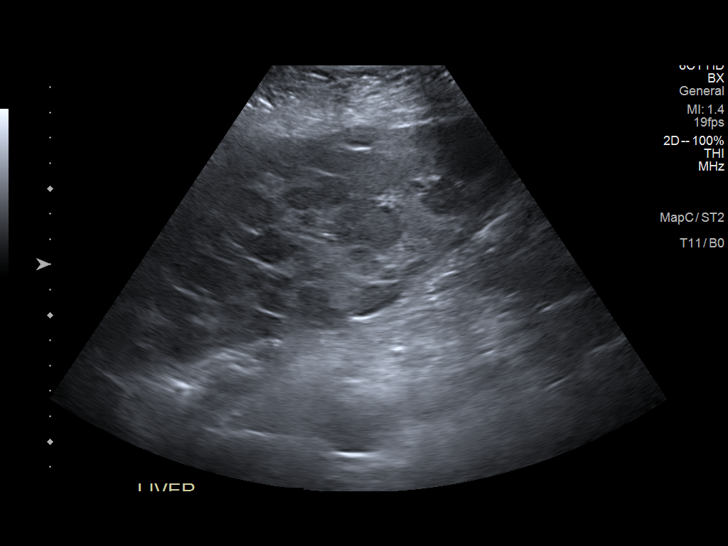
[im 13/22]
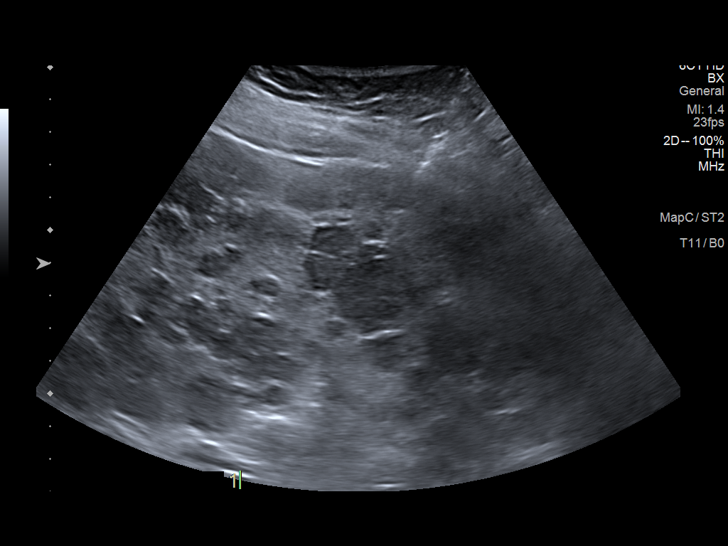
[im 15/22]
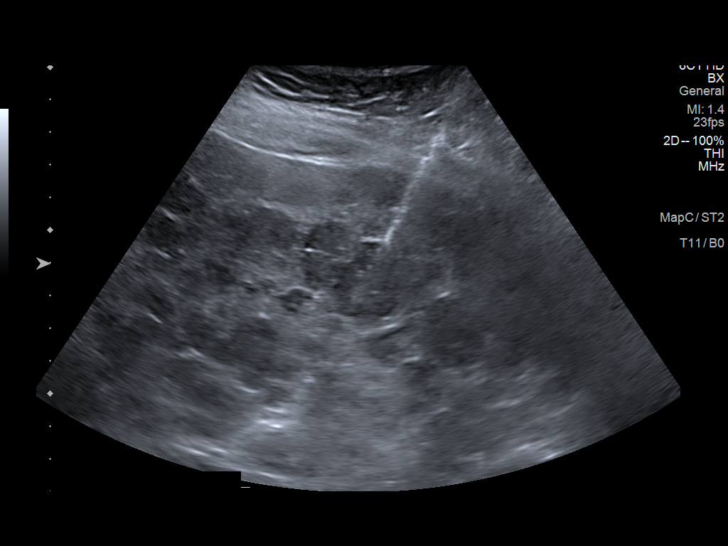
[im 17/22]
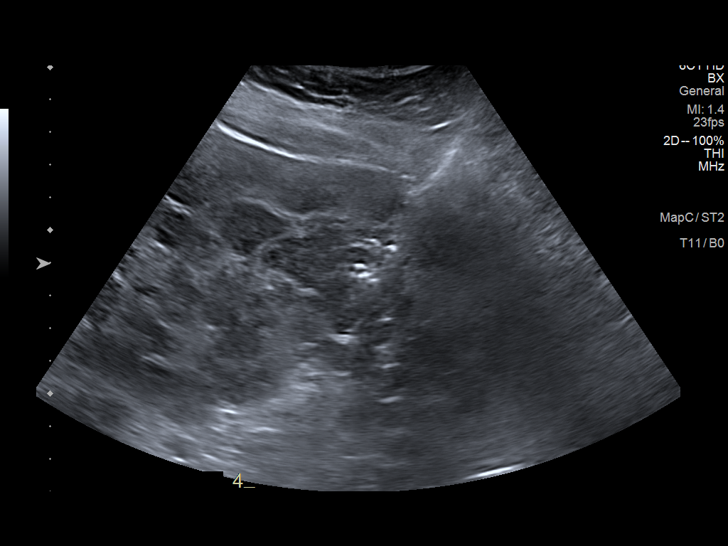
[im 18/22]
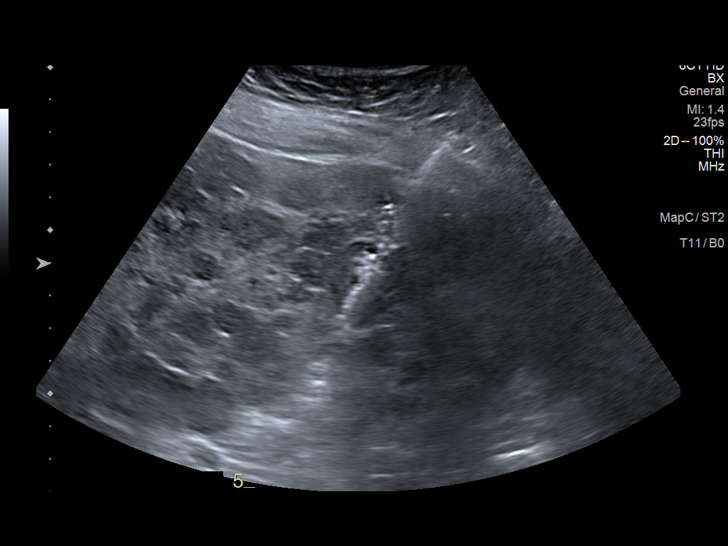
[im 20/22]
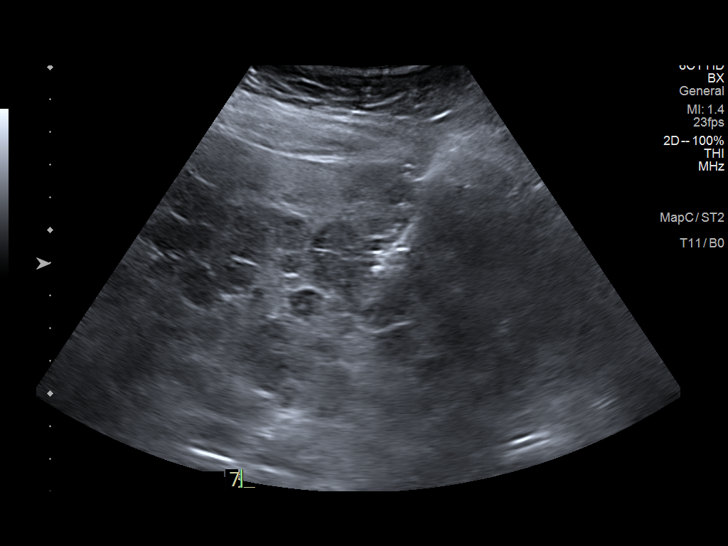
[im 22/22]
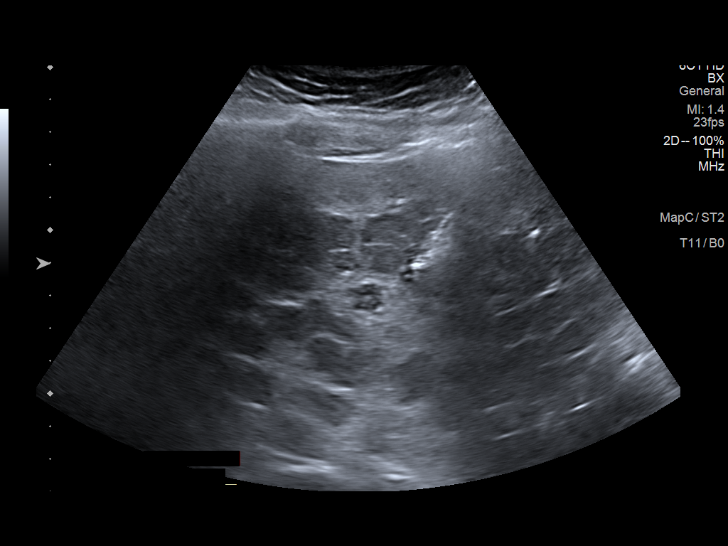

[13 of 22 positions shown; findings below may reference images not displayed]

MEDICATIONS:
None

ANESTHESIA/SEDATION:
Moderate (conscious) sedation was employed during this procedure as
administered by the [REDACTED].

A total of Versed 0.5 mg and Fentanyl 25 mcg was administered
intravenously.

Moderate Sedation Time: 15 minutes. The patient's level of
consciousness and vital signs were monitored continuously by
radiology nursing throughout the procedure under my direct
supervision.

COMPLICATIONS:
None immediate.

PROCEDURE:
Informed written consent was obtained from the patient after a
discussion of the risks, benefits and alternatives to treatment. The
patient understands and consents the procedure. A timeout was
performed prior to the initiation of the procedure.

Ultrasound scanning was performed of the right upper abdominal
quadrant demonstrates innumerable hypoechoic hepatic lesions many
more than were appreciated on preceding abdominal CT. A dominant
approximately 3.8 x 3.0 cm hypoechoic mass within the caudal aspect
the right lobe of the liver (image 14) was targeted for biopsy given
location and sonographic window. The procedure was planned.

The right upper abdominal quadrant was prepped and draped in the
usual sterile fashion. The overlying soft tissues were anesthetized
with 1% lidocaine with epinephrine. A 17 gauge, 6.8 cm co-axial
needle was advanced into a peripheral aspect of the lesion. This was
followed by 7 core biopsies with an 18 gauge core device under
direct ultrasound guidance.

The coaxial needle tract was embolized with a small amount of
Gel-Foam slurry and superficial hemostasis was obtained with manual
compression. Post procedural scanning was negative for definitive
area of hemorrhage or additional complication. A dressing was
placed. The patient tolerated the procedure well without immediate
post procedural complication.
IMPRESSION: 1. Technically successful ultrasound guided core needle biopsy of
dominant mass within the caudal aspect of the right lobe liver.
2. Note, innumerable liver lesions were identified on today's
procedure, many more than were questioned on preceding abdominal CT.

## 2021-10-29 MED ORDER — MIDAZOLAM HCL 2 MG/2ML IJ SOLN
INTRAMUSCULAR | Status: AC
  Filled 2021-10-29: qty 2

## 2021-10-29 MED ORDER — FENTANYL CITRATE (PF) 100 MCG/2ML IJ SOLN
INTRAMUSCULAR | Status: AC
  Filled 2021-10-29: qty 2

## 2021-10-29 MED ORDER — FENTANYL CITRATE (PF) 100 MCG/2ML IJ SOLN
INTRAMUSCULAR | Status: AC | PRN
  Administered 2021-10-29: 25 ug via INTRAVENOUS

## 2021-10-29 MED ORDER — MIDAZOLAM HCL 2 MG/2ML IJ SOLN
INTRAMUSCULAR | Status: AC | PRN
  Administered 2021-10-29: .5 mg via INTRAVENOUS

## 2021-10-29 MED ORDER — SODIUM CHLORIDE FLUSH 0.9 % IV SOLN
INTRAVENOUS | Status: AC
  Filled 2021-10-29: qty 10

## 2021-10-29 NOTE — Procedures (Signed)
Pre Procedure Dx: Liver lesions Post Procedural Dx: Same  Technically successful US guided biopsy of indeterminate lesion within the caudal aspect of the right lobe of the liver.   EBL: None No immediate complications.   Ronny Bacon, MD Pager #: [phone]

## 2021-10-31 LAB — SURGICAL PATHOLOGY

## 2021-11-02 ENCOUNTER — Encounter: Payer: Self-pay | Admitting: Oncology

## 2021-11-02 ENCOUNTER — Inpatient Hospital Stay (HOSPITAL_BASED_OUTPATIENT_CLINIC_OR_DEPARTMENT_OTHER): Payer: BC Managed Care – PPO | Admitting: Oncology

## 2021-11-02 ENCOUNTER — Inpatient Hospital Stay: Payer: BC Managed Care – PPO

## 2021-11-02 ENCOUNTER — Telehealth: Payer: Self-pay | Admitting: *Deleted

## 2021-11-02 ENCOUNTER — Other Ambulatory Visit: Payer: Self-pay

## 2021-11-02 ENCOUNTER — Encounter: Payer: Self-pay | Admitting: *Deleted

## 2021-11-02 ENCOUNTER — Inpatient Hospital Stay (HOSPITAL_BASED_OUTPATIENT_CLINIC_OR_DEPARTMENT_OTHER): Payer: BC Managed Care – PPO | Admitting: Hospice and Palliative Medicine

## 2021-11-02 ENCOUNTER — Other Ambulatory Visit: Payer: Self-pay | Admitting: *Deleted

## 2021-11-02 VITALS — BP 94/73 | HR 75 | Temp 96.7°F | Resp 18 | Wt 204.0 lb

## 2021-11-02 DIAGNOSIS — Z87891 Personal history of nicotine dependence: Secondary | ICD-10-CM

## 2021-11-02 DIAGNOSIS — I10 Essential (primary) hypertension: Secondary | ICD-10-CM | POA: Diagnosis not present

## 2021-11-02 DIAGNOSIS — E119 Type 2 diabetes mellitus without complications: Secondary | ICD-10-CM | POA: Diagnosis not present

## 2021-11-02 DIAGNOSIS — C787 Secondary malignant neoplasm of liver and intrahepatic bile duct: Secondary | ICD-10-CM | POA: Diagnosis not present

## 2021-11-02 DIAGNOSIS — C3432 Malignant neoplasm of lower lobe, left bronchus or lung: Secondary | ICD-10-CM

## 2021-11-02 DIAGNOSIS — Z7189 Other specified counseling: Secondary | ICD-10-CM

## 2021-11-02 DIAGNOSIS — Z515 Encounter for palliative care: Secondary | ICD-10-CM

## 2021-11-02 DIAGNOSIS — G893 Neoplasm related pain (acute) (chronic): Secondary | ICD-10-CM | POA: Diagnosis not present

## 2021-11-02 DIAGNOSIS — C3490 Malignant neoplasm of unspecified part of unspecified bronchus or lung: Secondary | ICD-10-CM | POA: Insufficient documentation

## 2021-11-02 DIAGNOSIS — E790 Hyperuricemia without signs of inflammatory arthritis and tophaceous disease: Secondary | ICD-10-CM | POA: Diagnosis not present

## 2021-11-02 DIAGNOSIS — Z803 Family history of malignant neoplasm of breast: Secondary | ICD-10-CM

## 2021-11-02 DIAGNOSIS — N289 Disorder of kidney and ureter, unspecified: Secondary | ICD-10-CM | POA: Diagnosis not present

## 2021-11-02 DIAGNOSIS — E883 Tumor lysis syndrome: Secondary | ICD-10-CM | POA: Diagnosis not present

## 2021-11-02 DIAGNOSIS — Z90710 Acquired absence of both cervix and uterus: Secondary | ICD-10-CM

## 2021-11-02 LAB — BASIC METABOLIC PANEL
Anion gap: 16 — ABNORMAL HIGH (ref 5–15)
BUN: 60 mg/dL — ABNORMAL HIGH (ref 8–23)
CO2: 22 mmol/L (ref 22–32)
Calcium: 9.3 mg/dL (ref 8.9–10.3)
Chloride: 88 mmol/L — ABNORMAL LOW (ref 98–111)
Creatinine, Ser: 2.31 mg/dL — ABNORMAL HIGH (ref 0.44–1.00)
GFR, Estimated: 23 mL/min — ABNORMAL LOW (ref >60)
Glucose, Bld: 125 mg/dL — ABNORMAL HIGH (ref 70–99)
Potassium: 4.9 mmol/L (ref 3.5–5.1)
Sodium: 126 mmol/L — ABNORMAL LOW (ref 135–145)

## 2021-11-02 LAB — URIC ACID: Uric Acid, Serum: 14.9 mg/dL — ABNORMAL HIGH (ref 2.5–7.1)

## 2021-11-02 LAB — HEPATIC FUNCTION PANEL
ALT: 115 U/L — ABNORMAL HIGH (ref 0–44)
AST: 351 U/L — ABNORMAL HIGH (ref 15–41)
Albumin: 3 g/dL — ABNORMAL LOW (ref 3.5–5.0)
Alkaline Phosphatase: 428 U/L — ABNORMAL HIGH (ref 38–126)
Bilirubin, Direct: 1.9 mg/dL — ABNORMAL HIGH (ref 0.0–0.2)
Indirect Bilirubin: 0.4 mg/dL (ref 0.3–0.9)
Total Bilirubin: 2.3 mg/dL — ABNORMAL HIGH (ref 0.3–1.2)
Total Protein: 6.9 g/dL (ref 6.5–8.1)

## 2021-11-02 LAB — PHOSPHORUS: Phosphorus: 3.9 mg/dL (ref 2.5–4.6)

## 2021-11-02 MED ORDER — FENTANYL 12 MCG/HR TD PT72: 1.0000 | MEDICATED_PATCH | TRANSDERMAL | 0 refills | Status: DC

## 2021-11-02 MED ORDER — OXYCODONE HCL 10 MG PO TABS: 10.0000 mg | ORAL_TABLET | ORAL | 0 refills | Status: DC | PRN

## 2021-11-02 MED ORDER — ALLOPURINOL 300 MG PO TABS: 300.0000 mg | ORAL_TABLET | Freq: Every day | ORAL | 3 refills | Status: DC

## 2021-11-02 MED ORDER — LIDOCAINE-PRILOCAINE 2.5-2.5 % EX CREA: TOPICAL_CREAM | CUTANEOUS | 3 refills | Status: DC

## 2021-11-02 MED ORDER — ONDANSETRON HCL 8 MG PO TABS: 8.0000 mg | ORAL_TABLET | Freq: Two times a day (BID) | ORAL | 1 refills | Status: DC | PRN

## 2021-11-02 MED ORDER — LORAZEPAM 0.5 MG PO TABS: 0.5000 mg | ORAL_TABLET | Freq: Four times a day (QID) | ORAL | 0 refills | Status: DC | PRN

## 2021-11-02 MED ORDER — PROCHLORPERAZINE MALEATE 10 MG PO TABS: 10.0000 mg | ORAL_TABLET | Freq: Four times a day (QID) | ORAL | 1 refills | Status: DC | PRN

## 2021-11-02 NOTE — Telephone Encounter (Signed)
Medication approved. I left a detailed vm on the pharmacy line reiterating the approval.

## 2021-11-02 NOTE — Telephone Encounter (Signed)
Callan Yontz (Key: [id number]) - [id number] fentaNYL 12MCG/HR 72 hr patches     Status: PA Response - Approved

## 2021-11-02 NOTE — Telephone Encounter (Signed)
Pamela [name] Key: [id number] - Rx #: [id number] Fentanyl patches require PA. PA submitted via covermymeds/ sent to [facility].

## 2021-11-02 NOTE — Telephone Encounter (Signed)
Called to speak to pt. And daughter in law  Pamela [name] answered the phone- Pamela [name]. I spoke to [facility] about pt. Pamela [name]. Pamela [name] was listening at same time. I called to let them know that her uric acid is high. It is usually high due to the cancer but when it gets a certain number with uric acid we need to get it lower down before getting chemo. While rao was speaking to both pt. And daughter in law she had asked if Pamela [name] could go to the education class and then the pt. Could go to get IVF. Now Dr. Janese Banks wants patient to get a medication that will help reduce the uric acid level. And get IVF and then if pt feels like it she can join education. I told them that education class is usually 3 hours. She put me on hold to talk to patient and they are agreeable to come 9 am and Pamela [name] will come 9 am education and the pt. Will come to infusion .

## 2021-11-02 NOTE — Progress Notes (Signed)
Patient here for oncology follow-up appointment, concerns of full feeling and pain 9/10

## 2021-11-02 NOTE — Telephone Encounter (Signed)
Also [facility] faxed and order to Dr. Peyton Najjar to get port placed.

## 2021-11-02 NOTE — Progress Notes (Signed)
Met with patient and her daughter during follow up visit with Dr. Janese Banks to discuss biopsy results and treatment planning. All questions answered during visit. Reviewed upcoming appts with pt and her daughter. Instructed that new prescriptions have been sent into her pharmacy and can start taking today. Appt for port placement pending at this time and pt informed that Judeen Hammans will give them a call once scheduled. Instructed to call with any questions or needs. Pt and her daughter verbalized understanding.

## 2021-11-02 NOTE — Progress Notes (Signed)
START ON PATHWAY REGIMEN - Small Cell Lung     Cycles 1 through 4, every 21 days:     Atezolizumab      Carboplatin      Etoposide    Cycles 5 and beyond, every 21 days:     Atezolizumab   **Always confirm dose/schedule in your pharmacy ordering system**  Patient Characteristics: Newly Diagnosed, Preoperative or Nonsurgical Candidate (Clinical Staging), First Line, Extensive Stage Therapeutic Status: Newly Diagnosed, Preoperative or Nonsurgical Candidate (Clinical Staging) AJCC T Category: cT2 AJCC N Category: cN2 AJCC M Category: pM1 AJCC 8 Stage Grouping: IV Stage Classification: Extensive  Intent of Therapy: Non-Curative / Palliative Intent, Discussed with Patient

## 2021-11-02 NOTE — Progress Notes (Signed)
[place] at [facility] [phone] [phone]   Name: Pamela [name] Date: 11/02/2021 MRN: [id number]  DOB: 13-Aug-1961  Patient Care Team: Marinda Elk, MD as PCP - General (Physician Assistant) Telford Nab, RN as Oncology Nurse Navigator    REASON FOR CONSULTATION: Pamela [name] is a 61 y.o. female with multiple medical problems including newly diagnosed extensive stage small cell lung cancer with liver metastases.  Patient has had significant pain and was referred to palliative care to help address goals and manage ongoing symptoms.  SOCIAL HISTORY:     reports that she quit smoking about 2 weeks ago. Her smoking use included cigarettes. She has a 43.00 pack-year smoking history. She has never used smokeless tobacco. She reports that she does not currently use alcohol. She reports that she does not use drugs.  Patient is married and lives at home with her husband.  She has a son and daughter who lives nearby.  Patient worked as a [employer] manager.  ADVANCE DIRECTIVES:  None  CODE STATUS:   PAST MEDICAL HISTORY: Past Medical History:  Diagnosis Date   Colon cancer ([place]) 2003   Diabetes mellitus without complication ([place])    Hypertension    Personal history of chemotherapy 2003   colon cancer   Uterine cancer ([place]) 2007    PAST SURGICAL HISTORY:  Past Surgical History:  Procedure Laterality Date   ABDOMINAL HYSTERECTOMY     BREAST EXCISIONAL BIOPSY Right 11/26/2010   neg/- PROLIFERATIVE FIBROCYSTIC CHANGE WITH ASSOCIATED    CHOLECYSTECTOMY     COLON SURGERY     2003   COLONOSCOPY     COLONOSCOPY WITH PROPOFOL N/A 12/11/2018   Procedure: COLONOSCOPY WITH PROPOFOL;  Surgeon: Lollie Sails, MD;  Location: [facility] ENDOSCOPY;  Service: Endoscopy;  Laterality: N/A;    HEMATOLOGY/ONCOLOGY HISTORY:  Oncology History  Malignant neoplasm of unspecified part of unspecified bronchus or lung ([place])  11/02/2021  Initial Diagnosis   Malignant neoplasm of unspecified part of unspecified bronchus or lung ([place])   11/07/2021 -  Chemotherapy   Patient is on Treatment Plan : LUNG SCLC Carboplatin + Etoposide + Atezolizumab Induction q21d / Atezolizumab Maintenance q21d       ALLERGIES:  is allergic to rofecoxib.  MEDICATIONS:  Current Outpatient Medications  Medication Sig Dispense Refill   albuterol (VENTOLIN HFA) 108 (90 Base) MCG/ACT inhaler SMARTSIG:2 Inhalation Via Inhaler Every 6 Hours PRN     allopurinol (ZYLOPRIM) 300 MG tablet Take 1 tablet (300 mg total) by mouth daily. 30 tablet 3   diclofenac sodium (VOLTAREN) 1 % GEL Apply topically 2 (two) times daily. (Patient not taking: Reported on 11/02/2021)     esomeprazole (NEXIUM) 20 MG capsule Take 20 mg by mouth daily at 12 noon.     ferrous sulfate 325 (65 FE) MG EC tablet Take 325 mg by mouth daily.     JANUMET 50-500 MG tablet Take 1 tablet by mouth 2 (two) times daily. (Patient not taking: Reported on 11/02/2021)     levothyroxine (SYNTHROID, LEVOTHROID) 50 MCG tablet Take 50 mcg by mouth daily before breakfast.     lidocaine-prilocaine (EMLA) cream Apply to affected area once 30 g 3   lisinopril-hydrochlorothiazide (PRINZIDE,ZESTORETIC) 10-12.5 MG tablet Take 1 tablet by mouth daily. (Patient not taking: Reported on 11/02/2021)     LORazepam (ATIVAN) 0.5 MG tablet Take 1 tablet (0.5 mg total) by mouth every 6 (six) hours as needed (Nausea or vomiting).  30 tablet 0   magnesium oxide (MAG-OX) 400 MG tablet Take 500 mg by mouth daily.     Multiple Vitamin (MULTIVITAMIN) tablet Take 1 tablet by mouth daily.     ondansetron (ZOFRAN) 8 MG tablet Take 1 tablet (8 mg total) by mouth 2 (two) times daily as needed for refractory nausea / vomiting. Start on day 3 after carboplatin chemo. 30 tablet 1   prochlorperazine (COMPAZINE) 10 MG tablet Take 1 tablet (10 mg total) by mouth every 6 (six) hours as needed (Nausea or vomiting). 30 tablet 1    Semaglutide,0.25 or 0.5MG/DOS, 2 MG/1.5ML SOPN Inject into the skin.     simvastatin (ZOCOR) 40 MG tablet Take 40 mg by mouth daily.     tiZANidine (ZANAFLEX) 2 MG tablet Take by mouth every 8 (eight) hours as needed for muscle spasms.     No current facility-administered medications for this visit.    VITAL SIGNS: There were no vitals taken for this visit. There were no vitals filed for this visit.  Estimated body mass index is 37.31 kg/m as calculated from the following:   Height as of 10/29/21: '5\' 2"'  (1.575 m).   Weight as of an earlier encounter on 11/02/21: 204 lb (92.5 kg).  LABS: CBC:    Component Value Date/Time   WBC 13.5 (H) 10/29/2021 0754   HGB 11.3 (L) 10/29/2021 0754   HCT 35.1 (L) 10/29/2021 0754   PLT 403 (H) 10/29/2021 0754   MCV 90.9 10/29/2021 0754   NEUTROABS 7.4 04/13/2021 1055   LYMPHSABS 1.9 04/13/2021 1055   MONOABS 0.6 04/13/2021 1055   EOSABS 0.2 04/13/2021 1055   BASOSABS 0.1 04/13/2021 1055   Comprehensive Metabolic Panel:    Component Value Date/Time   AST 351 (H) 11/02/2021 1243   ALT 115 (H) 11/02/2021 1243   ALKPHOS 428 (H) 11/02/2021 1243   BILITOT 2.3 (H) 11/02/2021 1243   PROT 6.9 11/02/2021 1243   ALBUMIN 3.0 (L) 11/02/2021 1243    RADIOGRAPHIC STUDIES: CT CHEST W CONTRAST  Result Date: 10/18/2021 CLINICAL DATA:  Abnormal CT of the abdomen and pelvis for which further evaluation with chest CT was suggested. EXAM: CT CHEST WITH CONTRAST TECHNIQUE: Multidetector CT imaging of the chest was performed during intravenous contrast administration. RADIATION DOSE REDUCTION: This exam was performed according to the departmental dose-optimization program which includes automated exposure control, adjustment of the mA and/or kV according to patient size and/or use of iterative reconstruction technique. CONTRAST:  50m OMNIPAQUE IOHEXOL 300 MG/ML  SOLN COMPARISON:  Comparison made with abdominal CT of the same date. Also with chest CT from May of 2021.  FINDINGS: Cardiovascular: Calcified atheromatous plaque of the thoracic aorta. No aneurysmal dilation. Heart size is normal. There are numerous pre pericardial lymph nodes with enlargement, see below. Central pulmonary vasculature is remarkable for narrowing due to extensive LEFT hilar adenopathy of the LEFT pulmonary artery. Otherwise not well evaluated on this venous phase. Mediastinum/Nodes: Extensive mediastinal and LEFT hilar adenopathy. LEFT thoracic inlet lymph node 11 mm (image 13/2) LEFT paratracheal lymph node anterior to the esophagus (image 23/2) 14 mm. Low LEFT paratracheal blending into AP window adenopathy (image 50/2) 16 mm. Subcarinal adenopathy, 17 mm (image 60/2) LEFT hilar nodal mass obscuring LEFT pulmonary artery measuring up to 4.0 x 2.6 cm (image 61/2) Pre pericardial nodal enlargement largest anterior to the RIGHT heart (image 98/2) 18 mm. Juxta esophageal lymph node with enlargement (image 108/2) 12 mm. No RIGHT hilar adenopathy. Lungs/Pleura: Extensive pleural disease  with pleural studding involving the entire LEFT chest, occasional pleural nodules tracking over the LEFT lung apex with most pronounced pleural involvement in the dependent aspect of the chest in the inferior posterior costodiaphragmatic recess. Here pleural involvement invades the fat adjacent to the aorta (image 111/2) masslike area measuring up to 2.6 x 1.9 cm is contiguous with nodular pleural thickening that extends along the entire posterior LEFT chest tracking to the mid and upper chest mixed with pleural fluid. Discrete nodules such as an area on image 77/2 track along the dependent and lateral greater than the anterior chest towards the LEFT lung apex and are scattered in terms of distribution, the largest area measuring approximately 1.8 x 1.0 cm (image 77/2) Masslike area in the superior segment of the AA LEFT lower lobe measuring 4.3 x 3.6 cm (image 49/2) this is contiguous with nodular masslike soft tissue that  extends to the LEFT hilum (image 56/2) pleural nodularity of the major fissure also demonstrated. RIGHT chest is clear. Upper Abdomen: Diffusely nodular and heterogeneous appearance of the liver with variable density throughout the liver and suggestion of masslike areas underlying steatotic changes is not well evaluated, better assessed on the prior CT of the abdomen which shows clear hepatic metastatic disease. Pancreas, visualized portions as well as the spleen without acute process. Adrenal glands are unremarkable. Musculoskeletal: Many of the areas of pleural disease along the LEFT chest appear to involve the extrapleural fat. No acute musculoskeletal process IMPRESSION: 1. Pulmonary mass with extensive interstitial disease, mediastinal adenopathy and pleural metastases, most suspicious for bronchogenic neoplasm. 2. Diffusely nodular and heterogeneous liver show signs of metastatic disease that are better seen on the CT of the abdomen and pelvis. 3. Aortic atherosclerosis. Aortic Atherosclerosis (ICD10-I70.0). Electronically Signed   By: Zetta Bills M.D.   On: 10/18/2021 14:46   CT ABDOMEN PELVIS W CONTRAST  Result Date: 10/18/2021 CLINICAL DATA:  Elevated LFTs and abdominal pain. EXAM: CT ABDOMEN AND PELVIS WITH CONTRAST TECHNIQUE: Multidetector CT imaging of the abdomen and pelvis was performed using the standard protocol following bolus administration of intravenous contrast. RADIATION DOSE REDUCTION: This exam was performed according to the departmental dose-optimization program which includes automated exposure control, adjustment of the mA and/or kV according to patient size and/or use of iterative reconstruction technique. CONTRAST:  158m OMNIPAQUE IOHEXOL 300 MG/ML  SOLN COMPARISON:  CT chest 01/19/2020 FINDINGS: Lower chest: Partially loculated scratch set there is a small partially loculated left pleural effusion identified. Signs of pleural spread of tumor within the left hemithorax identified  with multiple pleural based soft tissue nodules overlying the visualized portions of the left lung base. Right lung appears clear. Enlarged right cardiophrenic angle lymph node measures 1.7 cm, image 8/2. Left cardiophrenic angle lymph node is enlarged measuring 1.2 cm, image 14/2. Hepatobiliary: The liver has a pseudo cirrhotic appearance due to diffuse liver metastases: Index lesion within the posterior dome measures 2.8 x 2.8 cm, image 7/2. Posterior lateral right lobe of liver lesion measures 2.1 x 2.3 cm, image 24/2. Inferior right lobe of liver lesion measures 2.8 x 2.5 cm, image 42/2. Index lesion within the lateral segment of left hepatic lobe measures 2.1 by 2.0 cm, image 23/2 Status post cholecystectomy.  No bile duct dilatation. Pancreas: Unremarkable. No pancreatic ductal dilatation or surrounding inflammatory changes. Spleen: Normal in size without focal abnormality. Adrenals/Urinary Tract: Normal adrenal glands. No suspicious mass or hydronephrosis identified bilaterally. Small cyst within inferior pole of right kidney measures 1.5 cm. The urinary  bladder is unremarkable. Stomach/Bowel: Stomach is nondistended. Postsurgical changes from right hemicolectomy with enterocolonic anastomosis. No bowel wall thickening, inflammation, or distension. Vascular/Lymphatic: Aortic atherosclerosis. No aneurysm. Upper abdominal vascularity is patent. No adenopathy identified within the abdomen or pelvis. Reproductive: Status post hysterectomy.  No adnexal mass. Other: No ascites or focal fluid collections. Musculoskeletal: No acute or significant osseous findings. IMPRESSION: 1. Diffuse liver metastases. 2. Signs of pleural spread of tumor within the left hemithorax with small partially loculated left pleural effusion as well as multiple pleural based soft tissue nodules overlying the visualized portions of the left lung base. Recommend further evaluation of the chest with dedicated contrast enhanced CT of the chest.  3. Enlarged cardiophrenic angle lymph nodes compatible with nodal metastasis. 4. Status post right hemicolectomy with enterocolonic anastomosis. 5. Aortic Atherosclerosis (ICD10-I70.0). 6. These results were called by telephone at the time of interpretation on 10/18/2021 at 1:12 pm to provider [facility] , who verbally acknowledged these results. Electronically Signed   By: Kerby Moors M.D.   On: 10/18/2021 13:13   US BIOPSY (LIVER)  Result Date: 10/29/2021 INDICATION: History of colon and uterine cancer now with liver lesions worrisome for metastatic disease. Please perform ultrasound-guided biopsy for tissue diagnostic purposes. EXAM: ULTRASOUND GUIDED LIVER LESION BIOPSY COMPARISON:  CT of the chest, abdomen pelvis-10/18/2021 MEDICATIONS: None ANESTHESIA/SEDATION: Moderate (conscious) sedation was employed during this procedure as administered by the Interventional Radiology RN. A total of Versed 0.5 mg and Fentanyl 25 mcg was administered intravenously. Moderate Sedation Time: 15 minutes. The patient's level of consciousness and vital signs were monitored continuously by radiology nursing throughout the procedure under my direct supervision. COMPLICATIONS: None immediate. PROCEDURE: Informed written consent was obtained from the patient after a discussion of the risks, benefits and alternatives to treatment. The patient understands and consents the procedure. A timeout was performed prior to the initiation of the procedure. Ultrasound scanning was performed of the right upper abdominal quadrant demonstrates innumerable hypoechoic hepatic lesions many more than were appreciated on preceding abdominal CT. A dominant approximately 3.8 x 3.0 cm hypoechoic mass within the caudal aspect the right lobe of the liver (image 14) was targeted for biopsy given location and sonographic window. The procedure was planned. The right upper abdominal quadrant was prepped and draped in the usual sterile fashion. The  overlying soft tissues were anesthetized with 1% lidocaine with epinephrine. A 17 gauge, 6.8 cm co-axial needle was advanced into a peripheral aspect of the lesion. This was followed by 7 core biopsies with an 18 gauge core device under direct ultrasound guidance. The coaxial needle tract was embolized with a small amount of Gel-Foam slurry and superficial hemostasis was obtained with manual compression. Post procedural scanning was negative for definitive area of hemorrhage or additional complication. A dressing was placed. The patient tolerated the procedure well without immediate post procedural complication. IMPRESSION: 1. Technically successful ultrasound guided core needle biopsy of dominant mass within the caudal aspect of the right lobe liver. 2. Note, innumerable liver lesions were identified on today's procedure, many more than were questioned on preceding abdominal CT. Electronically Signed   By: Sandi Mariscal M.D.   On: 10/29/2021 10:20    PERFORMANCE STATUS (ECOG) : 2 - Symptomatic, <50% confined to bed  Review of Systems Unless otherwise noted, a complete review of systems is negative.  Physical Exam General: NAD Pulmonary: Unlabored Abdomen: soft, nontender, + bowel sounds GU: no suprapubic tenderness Extremities: no edema, no joint deformities Skin: no rashes Neurological:  Weakness but otherwise nonfocal  IMPRESSION: I met with patient and daughter-in-law following their visit with Dr. Janese Banks.  Plan is to start chemotherapy/immunotherapy next week.  Patient is frail at baseline.  She has significant symptom burden with upper abdominal/back pain, likely from substantial tumor burden in her liver.  Patient has been taking oxycodone 10 mg every 3 hours around-the-clock.  She is not sleeping at night and finds it difficult to obtain a comfortable position.  She has not found much relief from the oxycodone.  She denies adverse effects from pain meds.  We did discuss the option of starting  her on a long-acting pain medication.  Also discussed with Dr. Janese Banks and will proceed with low-dose fentanyl and liberalize as tolerated.  We will hold on starting steroids as patient will start immunotherapy next week.  Discussed bowel regimen in detail.  Patient is taking MiraLAX and having a bowel movement every couple of days.  I recommended the addition of Senokot.  Patient is taking ACP documents home to review with family.  She would want her husband to be her decision-maker if necessary.  We will plan to review a MOST form with her at a future date.  PLAN: -Continue current scope of treatment -Refill oxycodone 10 mg every 3 hours as needed #90 -Start fentanyl 12.5 mcg every 72 hours #5 -Daily bowel regimen with MiraLAX/senna -Nutrition consult -ACP documents -Plan to review MOST form in the future -RTC 1 week  Case and plan discussed with Dr. Janese Banks   Patient expressed understanding and was in agreement with this plan. She also understands that She can call the clinic at any time with any questions, concerns, or complaints.     Time Total: 20 minutes  Visit consisted of counseling and education dealing with the complex and emotionally intense issues of symptom management and palliative care in the setting of serious and potentially life-threatening illness.Greater than 50%  of this time was spent counseling and coordinating care related to the above assessment and plan.  Signed by: Altha Harm, PhD, NP-C

## 2021-11-03 ENCOUNTER — Ambulatory Visit: Admission: RE | Admit: 2021-11-03 | Payer: BC Managed Care – PPO | Source: Ambulatory Visit

## 2021-11-03 ENCOUNTER — Encounter: Payer: Self-pay | Admitting: Oncology

## 2021-11-03 ENCOUNTER — Inpatient Hospital Stay (HOSPITAL_COMMUNITY)
Admission: EM | Admit: 2021-11-03 | Discharge: 2021-11-21 | DRG: 673 | Disposition: A | Discharge status: Home / Self Care | Payer: BC Managed Care – PPO | Attending: Family Medicine | Admitting: Family Medicine

## 2021-11-03 ENCOUNTER — Encounter (HOSPITAL_COMMUNITY): Payer: Self-pay | Admitting: Emergency Medicine

## 2021-11-03 ENCOUNTER — Other Ambulatory Visit: Payer: Self-pay

## 2021-11-03 ENCOUNTER — Inpatient Hospital Stay (HOSPITAL_COMMUNITY): Payer: BC Managed Care – PPO

## 2021-11-03 DIAGNOSIS — E883 Tumor lysis syndrome: Secondary | ICD-10-CM | POA: Diagnosis present

## 2021-11-03 DIAGNOSIS — E8809 Other disorders of plasma-protein metabolism, not elsewhere classified: Secondary | ICD-10-CM | POA: Diagnosis present

## 2021-11-03 DIAGNOSIS — D649 Anemia, unspecified: Secondary | ICD-10-CM | POA: Diagnosis present

## 2021-11-03 DIAGNOSIS — R5381 Other malaise: Secondary | ICD-10-CM

## 2021-11-03 DIAGNOSIS — Z7189 Other specified counseling: Secondary | ICD-10-CM | POA: Insufficient documentation

## 2021-11-03 DIAGNOSIS — Z87891 Personal history of nicotine dependence: Secondary | ICD-10-CM

## 2021-11-03 DIAGNOSIS — I10 Essential (primary) hypertension: Secondary | ICD-10-CM | POA: Diagnosis present

## 2021-11-03 DIAGNOSIS — E871 Hypo-osmolality and hyponatremia: Secondary | ICD-10-CM | POA: Diagnosis not present

## 2021-11-03 DIAGNOSIS — Z6841 Body Mass Index (BMI) 40.0 and over, adult: Secondary | ICD-10-CM | POA: Diagnosis not present

## 2021-11-03 DIAGNOSIS — Z8589 Personal history of malignant neoplasm of other organs and systems: Secondary | ICD-10-CM

## 2021-11-03 DIAGNOSIS — Z8542 Personal history of malignant neoplasm of other parts of uterus: Secondary | ICD-10-CM

## 2021-11-03 DIAGNOSIS — N179 Acute kidney failure, unspecified: Secondary | ICD-10-CM

## 2021-11-03 DIAGNOSIS — E039 Hypothyroidism, unspecified: Secondary | ICD-10-CM | POA: Diagnosis present

## 2021-11-03 DIAGNOSIS — R238 Other skin changes: Secondary | ICD-10-CM | POA: Diagnosis present

## 2021-11-03 DIAGNOSIS — Z20822 Contact with and (suspected) exposure to covid-19: Secondary | ICD-10-CM | POA: Diagnosis present

## 2021-11-03 DIAGNOSIS — J9811 Atelectasis: Secondary | ICD-10-CM | POA: Diagnosis present

## 2021-11-03 DIAGNOSIS — E8729 Other acidosis: Secondary | ICD-10-CM

## 2021-11-03 DIAGNOSIS — E119 Type 2 diabetes mellitus without complications: Secondary | ICD-10-CM

## 2021-11-03 DIAGNOSIS — I6200 Nontraumatic subdural hemorrhage, unspecified: Secondary | ICD-10-CM | POA: Diagnosis present

## 2021-11-03 DIAGNOSIS — Z85038 Personal history of other malignant neoplasm of large intestine: Secondary | ICD-10-CM

## 2021-11-03 DIAGNOSIS — Z800 Family history of malignant neoplasm of digestive organs: Secondary | ICD-10-CM

## 2021-11-03 DIAGNOSIS — E669 Obesity, unspecified: Secondary | ICD-10-CM | POA: Diagnosis present

## 2021-11-03 DIAGNOSIS — R946 Abnormal results of thyroid function studies: Secondary | ICD-10-CM | POA: Diagnosis present

## 2021-11-03 DIAGNOSIS — I5033 Acute on chronic diastolic (congestive) heart failure: Secondary | ICD-10-CM | POA: Diagnosis not present

## 2021-11-03 DIAGNOSIS — I110 Hypertensive heart disease with heart failure: Secondary | ICD-10-CM | POA: Diagnosis present

## 2021-11-03 DIAGNOSIS — R748 Abnormal levels of other serum enzymes: Secondary | ICD-10-CM

## 2021-11-03 DIAGNOSIS — C787 Secondary malignant neoplasm of liver and intrahepatic bile duct: Secondary | ICD-10-CM | POA: Diagnosis present

## 2021-11-03 DIAGNOSIS — C3490 Malignant neoplasm of unspecified part of unspecified bronchus or lung: Secondary | ICD-10-CM | POA: Diagnosis not present

## 2021-11-03 DIAGNOSIS — R109 Unspecified abdominal pain: Secondary | ICD-10-CM | POA: Diagnosis not present

## 2021-11-03 DIAGNOSIS — D61818 Other pancytopenia: Secondary | ICD-10-CM | POA: Diagnosis not present

## 2021-11-03 DIAGNOSIS — E790 Hyperuricemia without signs of inflammatory arthritis and tophaceous disease: Secondary | ICD-10-CM | POA: Insufficient documentation

## 2021-11-03 DIAGNOSIS — Z79899 Other long term (current) drug therapy: Secondary | ICD-10-CM

## 2021-11-03 DIAGNOSIS — N289 Disorder of kidney and ureter, unspecified: Secondary | ICD-10-CM

## 2021-11-03 DIAGNOSIS — D61810 Antineoplastic chemotherapy induced pancytopenia: Secondary | ICD-10-CM | POA: Diagnosis not present

## 2021-11-03 DIAGNOSIS — E860 Dehydration: Secondary | ICD-10-CM | POA: Diagnosis present

## 2021-11-03 DIAGNOSIS — E878 Other disorders of electrolyte and fluid balance, not elsewhere classified: Secondary | ICD-10-CM

## 2021-11-03 DIAGNOSIS — R0902 Hypoxemia: Secondary | ICD-10-CM

## 2021-11-03 DIAGNOSIS — E876 Hypokalemia: Secondary | ICD-10-CM | POA: Diagnosis not present

## 2021-11-03 DIAGNOSIS — R600 Localized edema: Secondary | ICD-10-CM | POA: Diagnosis not present

## 2021-11-03 DIAGNOSIS — T502X5A Adverse effect of carbonic-anhydrase inhibitors, benzothiadiazides and other diuretics, initial encounter: Secondary | ICD-10-CM | POA: Diagnosis not present

## 2021-11-03 DIAGNOSIS — J90 Pleural effusion, not elsewhere classified: Secondary | ICD-10-CM

## 2021-11-03 DIAGNOSIS — E861 Hypovolemia: Secondary | ICD-10-CM | POA: Diagnosis present

## 2021-11-03 DIAGNOSIS — E874 Mixed disorder of acid-base balance: Secondary | ICD-10-CM | POA: Diagnosis present

## 2021-11-03 DIAGNOSIS — R0602 Shortness of breath: Secondary | ICD-10-CM

## 2021-11-03 DIAGNOSIS — T451X5A Adverse effect of antineoplastic and immunosuppressive drugs, initial encounter: Secondary | ICD-10-CM | POA: Diagnosis not present

## 2021-11-03 DIAGNOSIS — R0601 Orthopnea: Secondary | ICD-10-CM | POA: Diagnosis not present

## 2021-11-03 DIAGNOSIS — D539 Nutritional anemia, unspecified: Secondary | ICD-10-CM | POA: Diagnosis present

## 2021-11-03 DIAGNOSIS — M7989 Other specified soft tissue disorders: Secondary | ICD-10-CM | POA: Diagnosis not present

## 2021-11-03 DIAGNOSIS — J189 Pneumonia, unspecified organism: Secondary | ICD-10-CM

## 2021-11-03 DIAGNOSIS — R899 Unspecified abnormal finding in specimens from other organs, systems and tissues: Secondary | ICD-10-CM

## 2021-11-03 DIAGNOSIS — Z803 Family history of malignant neoplasm of breast: Secondary | ICD-10-CM

## 2021-11-03 DIAGNOSIS — L03119 Cellulitis of unspecified part of limb: Secondary | ICD-10-CM

## 2021-11-03 DIAGNOSIS — C3432 Malignant neoplasm of lower lobe, left bronchus or lung: Secondary | ICD-10-CM | POA: Diagnosis present

## 2021-11-03 DIAGNOSIS — T50905A Adverse effect of unspecified drugs, medicaments and biological substances, initial encounter: Secondary | ICD-10-CM | POA: Diagnosis not present

## 2021-11-03 DIAGNOSIS — Z90710 Acquired absence of both cervix and uterus: Secondary | ICD-10-CM

## 2021-11-03 DIAGNOSIS — Z79890 Hormone replacement therapy (postmenopausal): Secondary | ICD-10-CM

## 2021-11-03 DIAGNOSIS — E785 Hyperlipidemia, unspecified: Secondary | ICD-10-CM | POA: Diagnosis present

## 2021-11-03 DIAGNOSIS — S065XAA Traumatic subdural hemorrhage with loss of consciousness status unknown, initial encounter: Secondary | ICD-10-CM | POA: Diagnosis not present

## 2021-11-03 DIAGNOSIS — E873 Alkalosis: Secondary | ICD-10-CM

## 2021-11-03 LAB — BASIC METABOLIC PANEL
Anion gap: 14 (ref 5–15)
BUN: 58 mg/dL — ABNORMAL HIGH (ref 8–23)
CO2: 20 mmol/L — ABNORMAL LOW (ref 22–32)
Calcium: 9 mg/dL (ref 8.9–10.3)
Chloride: 91 mmol/L — ABNORMAL LOW (ref 98–111)
Creatinine, Ser: 2.08 mg/dL — ABNORMAL HIGH (ref 0.44–1.00)
GFR, Estimated: 27 mL/min — ABNORMAL LOW (ref >60)
Glucose, Bld: 113 mg/dL — ABNORMAL HIGH (ref 70–99)
Potassium: 5 mmol/L (ref 3.5–5.1)
Sodium: 125 mmol/L — ABNORMAL LOW (ref 135–145)

## 2021-11-03 LAB — COMPREHENSIVE METABOLIC PANEL
ALT: 141 U/L — ABNORMAL HIGH (ref 0–44)
AST: 389 U/L — ABNORMAL HIGH (ref 15–41)
Albumin: 2.7 g/dL — ABNORMAL LOW (ref 3.5–5.0)
Alkaline Phosphatase: 440 U/L — ABNORMAL HIGH (ref 38–126)
Anion gap: 18 — ABNORMAL HIGH (ref 5–15)
BUN: 62 mg/dL — ABNORMAL HIGH (ref 8–23)
CO2: 19 mmol/L — ABNORMAL LOW (ref 22–32)
Calcium: 9.4 mg/dL (ref 8.9–10.3)
Chloride: 89 mmol/L — ABNORMAL LOW (ref 98–111)
Creatinine, Ser: 2.34 mg/dL — ABNORMAL HIGH (ref 0.44–1.00)
GFR, Estimated: 23 mL/min — ABNORMAL LOW (ref >60)
Glucose, Bld: 121 mg/dL — ABNORMAL HIGH (ref 70–99)
Potassium: 4.9 mmol/L (ref 3.5–5.1)
Sodium: 126 mmol/L — ABNORMAL LOW (ref 135–145)
Total Bilirubin: 3.5 mg/dL — ABNORMAL HIGH (ref 0.3–1.2)
Total Protein: 6.5 g/dL (ref 6.5–8.1)

## 2021-11-03 LAB — CBC WITH DIFFERENTIAL/PLATELET
Abs Immature Granulocytes: 0.12 10*3/uL — ABNORMAL HIGH (ref 0.00–0.07)
Basophils Absolute: 0 10*3/uL (ref 0.0–0.1)
Basophils Relative: 0 %
Eosinophils Absolute: 0 10*3/uL (ref 0.0–0.5)
Eosinophils Relative: 0 %
HCT: 36.2 % (ref 36.0–46.0)
Hemoglobin: 11.8 g/dL — ABNORMAL LOW (ref 12.0–15.0)
Immature Granulocytes: 1 %
Lymphocytes Relative: 3 %
Lymphs Abs: 0.4 10*3/uL — ABNORMAL LOW (ref 0.7–4.0)
MCH: 29.6 pg (ref 26.0–34.0)
MCHC: 32.6 g/dL (ref 30.0–36.0)
MCV: 91 fL (ref 80.0–100.0)
Monocytes Absolute: 0.8 10*3/uL (ref 0.1–1.0)
Monocytes Relative: 6 %
Neutro Abs: 12 10*3/uL — ABNORMAL HIGH (ref 1.7–7.7)
Neutrophils Relative %: 90 %
Platelets: 413 10*3/uL — ABNORMAL HIGH (ref 150–400)
RBC: 3.98 MIL/uL (ref 3.87–5.11)
RDW: 15.8 % — ABNORMAL HIGH (ref 11.5–15.5)
WBC: 13.3 10*3/uL — ABNORMAL HIGH (ref 4.0–10.5)
nRBC: 0 % (ref 0.0–0.2)

## 2021-11-03 LAB — URINALYSIS, ROUTINE W REFLEX MICROSCOPIC
Bilirubin Urine: NEGATIVE
Glucose, UA: NEGATIVE mg/dL
Hgb urine dipstick: NEGATIVE
Ketones, ur: 5 mg/dL — AB
Leukocytes,Ua: NEGATIVE
Nitrite: NEGATIVE
Protein, ur: NEGATIVE mg/dL
Specific Gravity, Urine: 1.014 (ref 1.005–1.030)
pH: 5 (ref 5.0–8.0)

## 2021-11-03 LAB — URIC ACID: Uric Acid, Serum: 13.6 mg/dL — ABNORMAL HIGH (ref 2.5–7.1)

## 2021-11-03 LAB — HIV ANTIBODY (ROUTINE TESTING W REFLEX): HIV Screen 4th Generation wRfx: NONREACTIVE

## 2021-11-03 LAB — RESP PANEL BY RT-PCR (FLU A&B, COVID) ARPGX2
Influenza A by PCR: NEGATIVE
Influenza B by PCR: NEGATIVE
SARS Coronavirus 2 by RT PCR: NEGATIVE

## 2021-11-03 LAB — OSMOLALITY: Osmolality: 285 mOsm/kg (ref 275–295)

## 2021-11-03 LAB — LACTATE DEHYDROGENASE: LDH: 1315 U/L — ABNORMAL HIGH (ref 98–192)

## 2021-11-03 LAB — CBG MONITORING, ED
Glucose-Capillary: 103 mg/dL — ABNORMAL HIGH (ref 70–99)
Glucose-Capillary: 126 mg/dL — ABNORMAL HIGH (ref 70–99)

## 2021-11-03 LAB — TSH: TSH: 10.226 u[IU]/mL — ABNORMAL HIGH (ref 0.350–4.500)

## 2021-11-03 LAB — PHOSPHORUS: Phosphorus: 5 mg/dL — ABNORMAL HIGH (ref 2.5–4.6)

## 2021-11-03 LAB — T4, FREE: Free T4: 1.89 ng/dL — ABNORMAL HIGH (ref 0.61–1.12)

## 2021-11-03 IMAGING — XA IR PICC >5YO
2 series · 2 of 2 positions shown · IV contrast (agent unspecified)
Comparison: none

INDICATION: Patient with a history of small cell lung cancer and need of durable
venous access for chemotherapy. Interventional radiology asked to
place a PICC.

EXAM:
ULTRASOUND AND FLUOROSCOPIC GUIDED PICC LINE INSERTION
MEDICATIONS:
1% lidocaine, 1 mL
CONTRAST:  None
FLUOROSCOPY TIME:  1 minute 36 seconds  (9  mGy)
COMPLICATIONS:
None immediate.
TECHNIQUE: The procedure, risks, benefits, and alternatives were explained to
the patient and informed written consent was obtained.

[Series 1: ir picc >5yo · 1 of 1 slices shown]
[im 1/1]
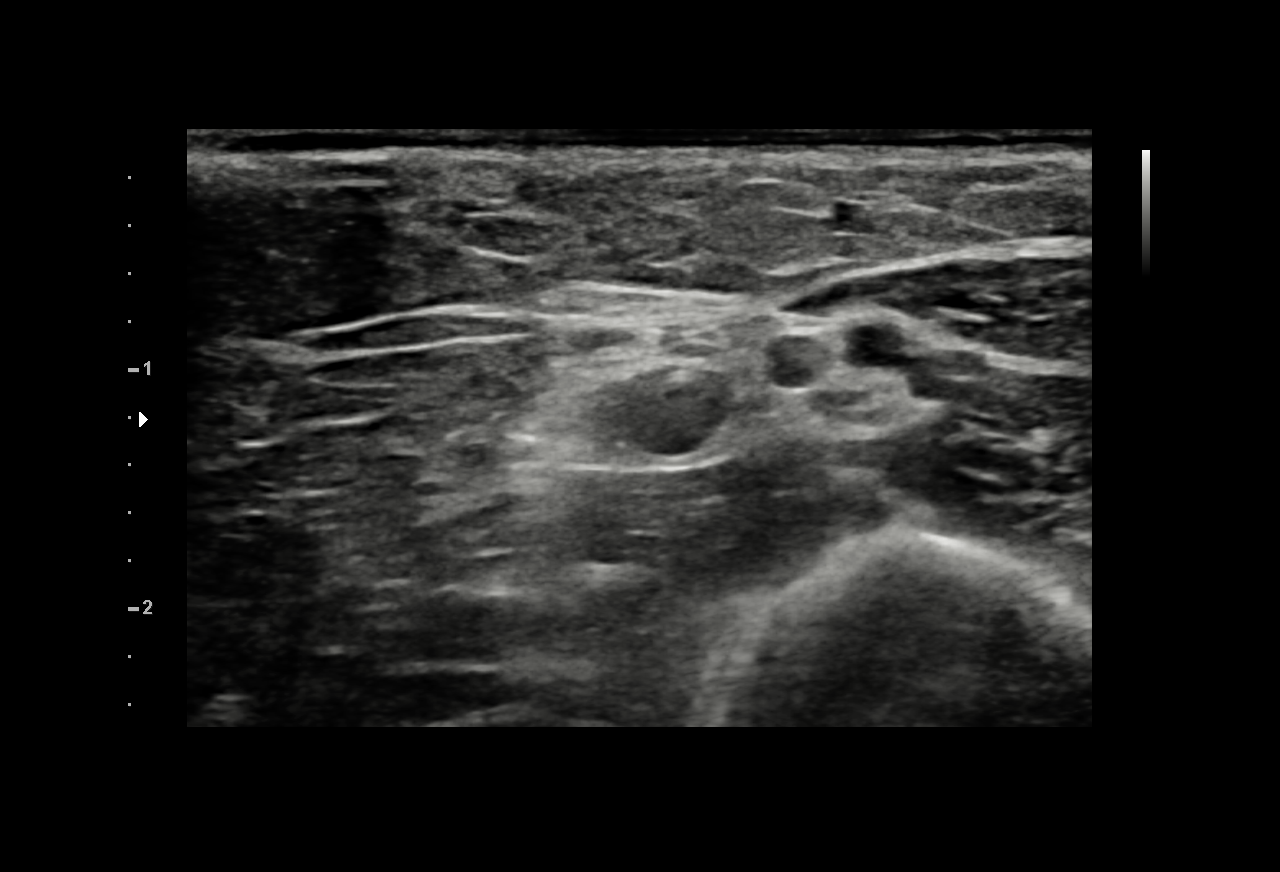

[Series 5: fl (-) angio · 1 of 1 slices shown]
[im 1/1]
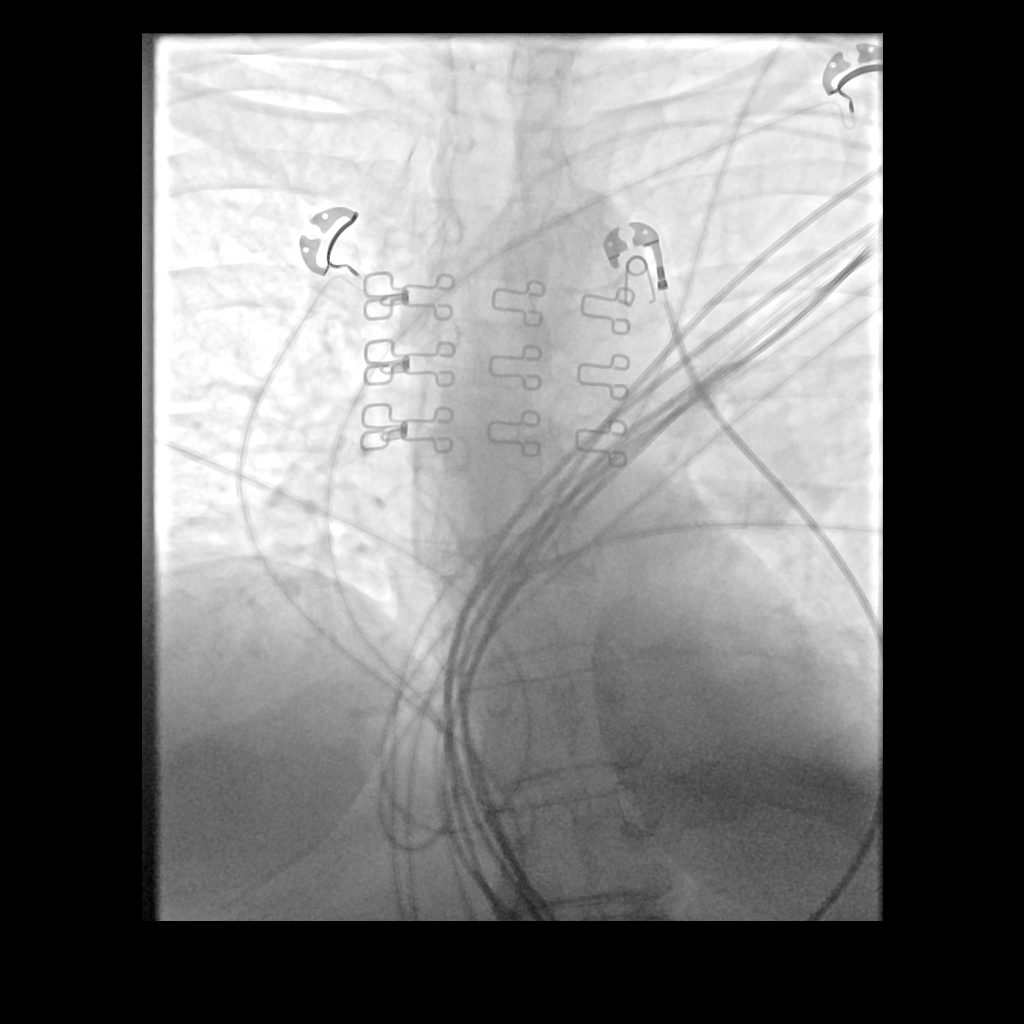

[2 of 2 positions shown; findings below may reference images not displayed]

The left upper extremity was prepped with chlorhexidine in a sterile
fashion, and a sterile drape was applied covering the operative
field. Maximum barrier sterile technique with sterile gowns and
gloves were used for the procedure. A timeout was performed prior to
the initiation of the procedure. Local anesthesia was provided with
1% lidocaine.

After the overlying soft tissues were anesthetized with 1%
lidocaine, a micropuncture kit was utilized to access the left
brachial vein. Real-time ultrasound guidance was utilized for
vascular access including the acquisition of a permanent ultrasound
image documenting patency of the accessed vessel.

A guidewire was advanced to the level of the superior caval-atrial
junction for measurement purposes and the PICC line was cut to
length. A peel-away sheath was placed and a 43 cm, 5 French, dual
lumen was inserted to level of the superior caval-atrial junction. A
post procedure spot fluoroscopic was obtained. The catheter easily
aspirated and flushed and was secured in place. A dressing was
placed. The patient tolerated the procedure well without immediate
post procedural complication. PICC line placed by Dr. KOTOKU
FINDINGS: After catheter placement, the tip lies within the superior
cavoatrial junction. The catheter aspirates and flushes normally and
is ready for immediate use.
IMPRESSION: Successful ultrasound and fluoroscopic guided placement of a left
brachial vein approach, 43 cm, 5 French, dual lumen PICC with tip at
the superior caval-atrial junction. The PICC line is ready for
immediate use. Read by: KOTOKU, NP

## 2021-11-03 IMAGING — MR MR HEAD W/O CM
10 of 11 series · 42 of 48 positions shown · non-contrast
Comparison: Prior head CT from [DATE].

CLINICAL DATA: Initial evaluation for non-small cell lung cancer,
staging.

EXAM:
MRI HEAD WITHOUT CONTRAST
TECHNIQUE: Multiplanar, multiecho pulse sequences of the brain and surrounding
structures were obtained without intravenous contrast.

[Series 5: DWI · axial · 3.0mm · 0.88mm/px · z∈[-113,+27]mm · 9 of 96 slices shown (1 of 4)]
[im 1/96]
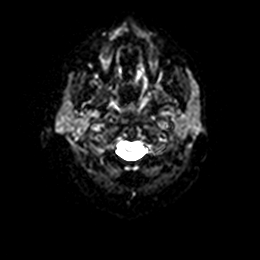
[im 12/96]
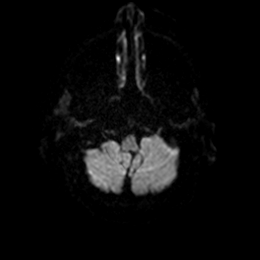
[im 24/96]
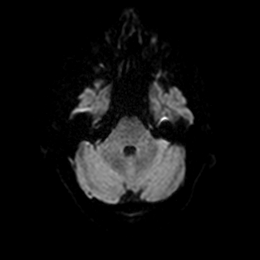
[im 36/96]
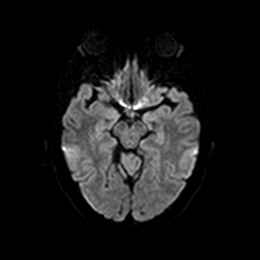
[im 48/96]
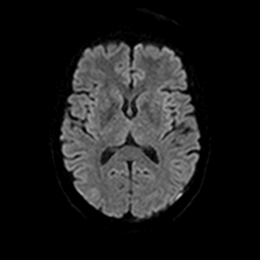
[im 60/96]
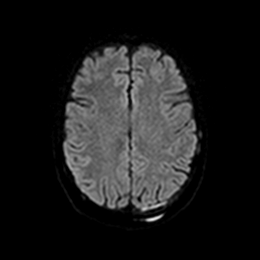
[im 72/96]
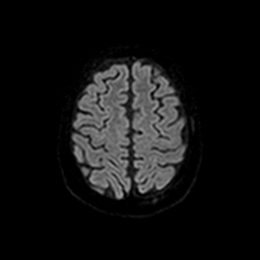
[im 84/96]
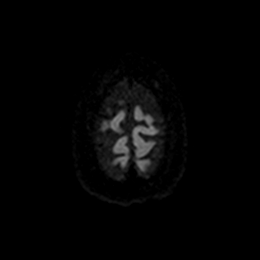
[im 96/96]
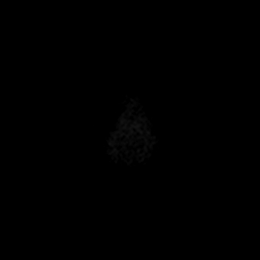

[Series 6: DWI · axial · 3.0mm · 0.88mm/px · z∈[-113,+27]mm · 4 of 48 slices shown (2 of 4)]
[im 1/48]
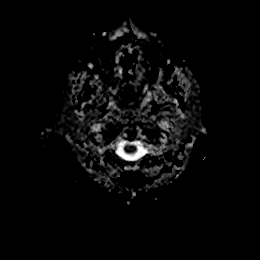
[im 16/48]
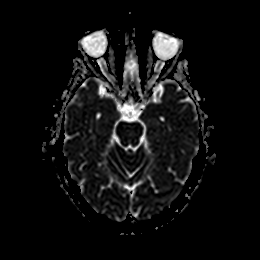
[im 32/48]
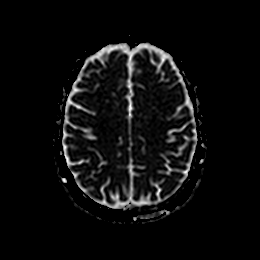
[im 48/48]
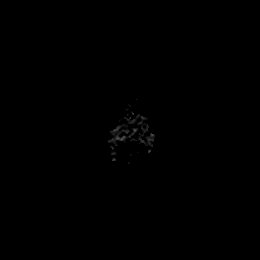

[Series 7: DWI · coronal · 4.0mm · 0.88mm/px · 6 of 66 slices shown (3 of 4)]
[im 1/66]
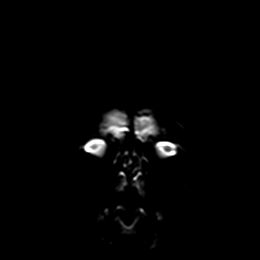
[im 14/66]
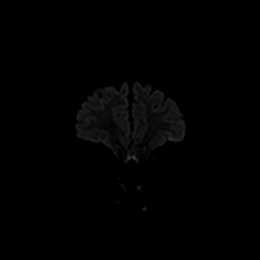
[im 27/66]
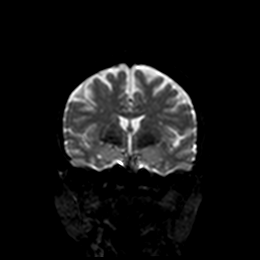
[im 40/66]
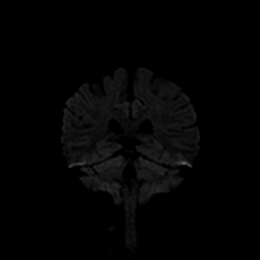
[im 53/66]
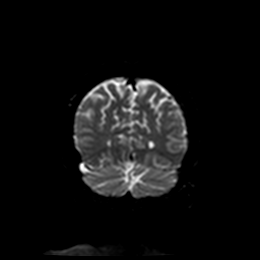
[im 66/66]
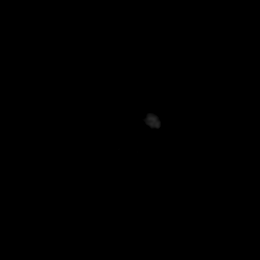

[Series 8: DWI · coronal · 4.0mm · 0.88mm/px · 3 of 33 slices shown (4 of 4)]
[im 1/33]
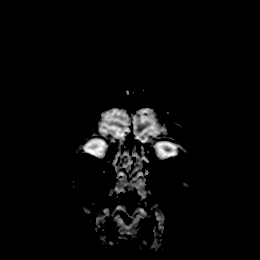
[im 17/33]
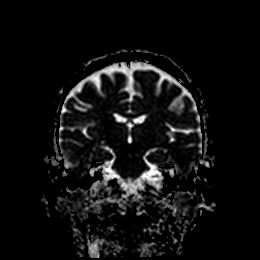
[im 33/33]
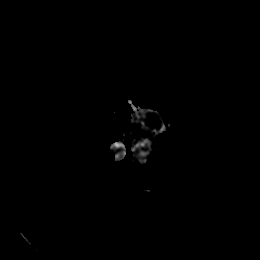

[Series 9: T1 · sagittal · 5.0mm · 0.75mm/px · 2 of 23 slices shown]
[im 1/23]
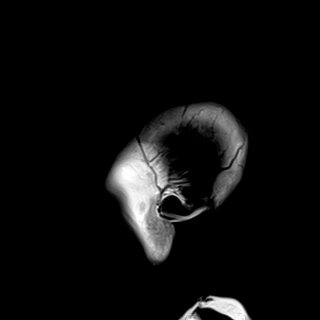
[im 23/23]
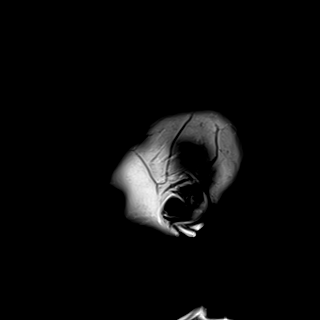

[Series 10: T2 · axial · 5.0mm · 0.72mm/px · z∈[-115,+28]mm · 2 of 25 slices shown (1 of 2)]
[im 1/25]
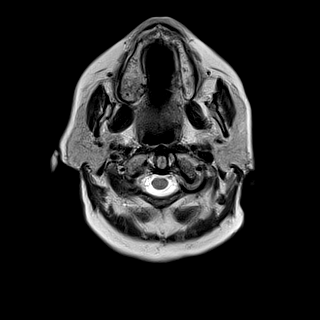
[im 25/25]
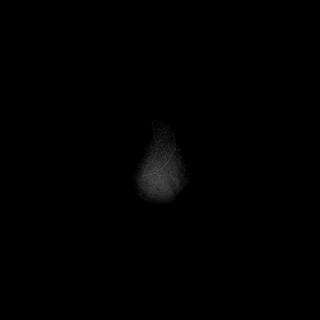

[Series 11: FLAIR · axial · 5.0mm · 0.45mm/px · z∈[-112,+31]mm · 2 of 25 slices shown]
[im 1/25]
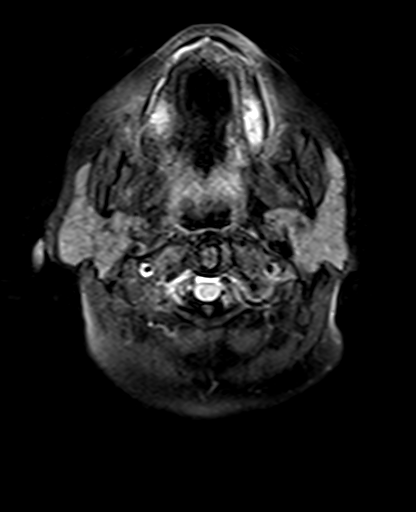
[im 25/25]
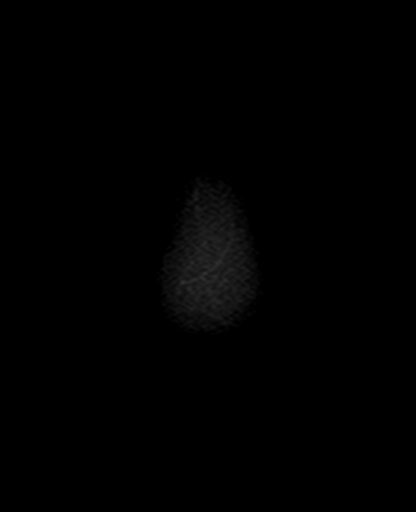

[Series 13: pha_images · axial · 3.0mm · 0.90mm/px · z∈[-129,+41]mm · 5 of 56 slices shown]
[im 1/56]
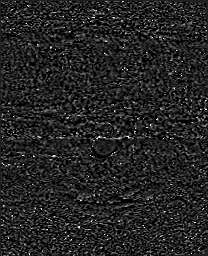
[im 14/56]
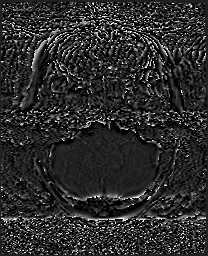
[im 28/56]
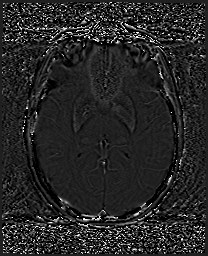
[im 42/56]
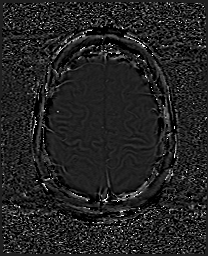
[im 56/56]
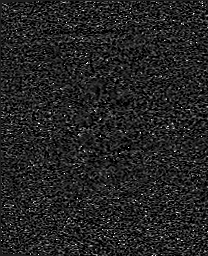

[Series 14: swi_images · axial · 3.0mm · 0.90mm/px · z∈[-129,+47]mm · 6 of 60 slices shown]
[im 1/60]
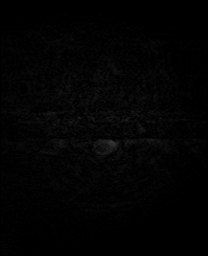
[im 12/60]
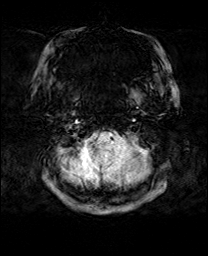
[im 24/60]
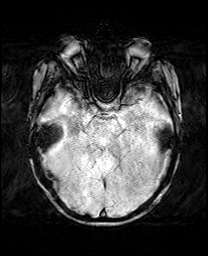
[im 36/60]
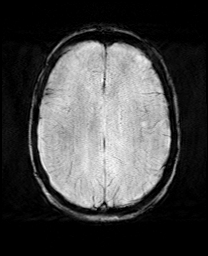
[im 48/60]
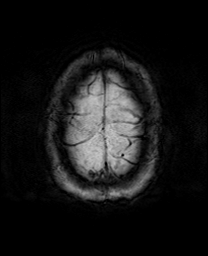
[im 60/60]
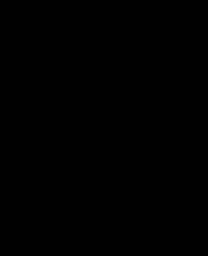

[Series 17: T2 · coronal · 5.0mm · 0.34mm/px · 3 of 29 slices shown (2 of 2)]
[im 1/29]
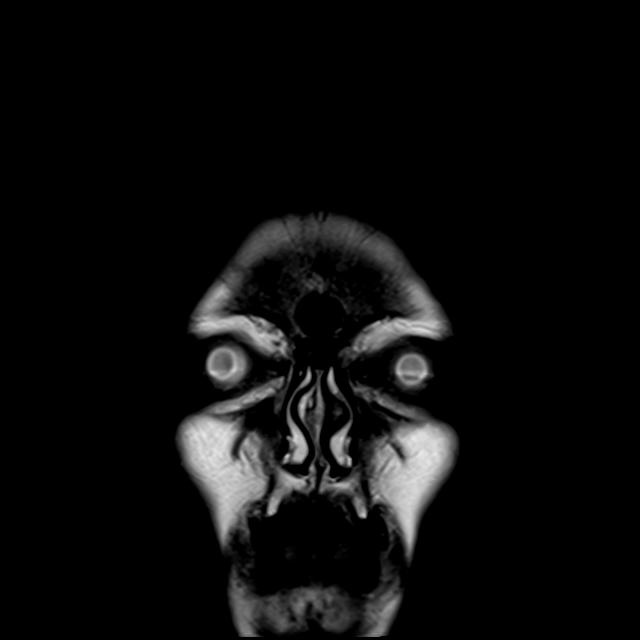
[im 15/29]
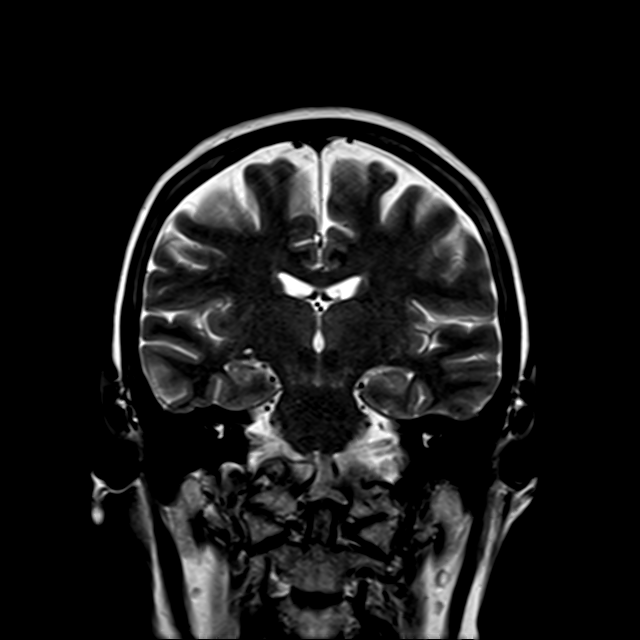
[im 29/29]
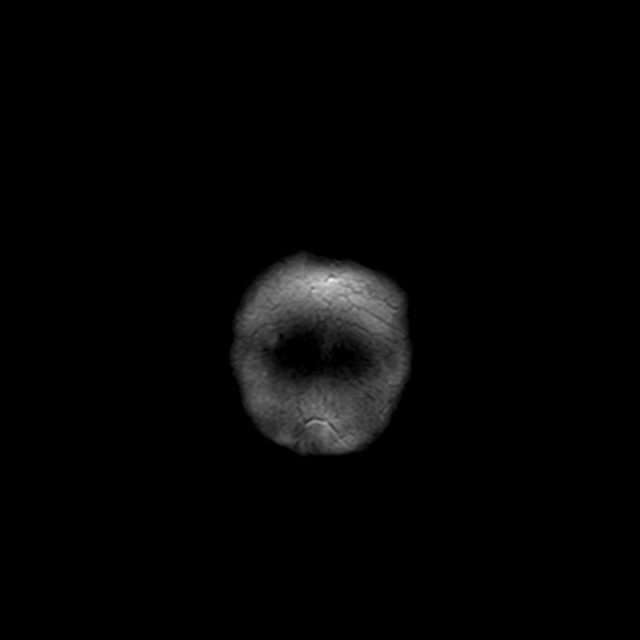

[42 of 48 positions shown; findings below may reference images not displayed]

FINDINGS: Brain: Cerebral volume within normal limits. Scattered subcentimeter
foci of T2/FLAIR hyperintensity involving the periventricular, deep,
and subcortical white matter both cerebral hemispheres, nonspecific,
but most likely related chronic microvascular ischemic disease, mild
in nature.

No evidence for acute or subacute infarct. Gray-white matter
differentiation maintained. No encephalomalacia to suggest chronic
cortical infarction.

Small extra-axial collection overlying the left parieto-occipital
convexity measures up to 5 mm, likely a small subacute left subdural
hematoma (series 11, image 14). No significant mass effect. No other
acute or chronic intracranial blood products.

No mass lesion, midline shift or mass effect. No evidence for
intracranial metastatic disease. No hydrocephalus. Pituitary gland
suprasellar region within normal limits. Midline structures intact.

Vascular: Major intracranial vascular flow voids are maintained.

Skull and upper cervical spine: Craniocervical junction within
normal limits. Question T1 hypointense lesion measuring
approximately 1 cm within the C4 vertebral body, nonspecific, but
could reflect an osseous metastasis (series 9, image 12). No other
definite focal marrow replacing lesion. Small focus of FLAIR
hyperintensity noted involving the left occipital scalp, superficial
to the underlying left extra-axial hemorrhage, suspected to reflect
an evolving small soft tissue contusion (series 11, image 15). Scalp
soft tissues otherwise unremarkable.

Sinuses/Orbits: Globes and orbital soft tissues within normal
limits. Paranasal sinuses are clear. No mastoid effusion.

Other: None.
IMPRESSION: 1. 5 mm extra-axial collection overlying the left parieto-occipital
convexity, likely a small subdural hematoma, likely subacute. No
associated mass effect.
2. No other acute intracranial abnormality. No evidence for
intracranial metastatic disease.
3. 1 cm T1 hypointense lesion/area within the C4 vertebral body,
nonspecific and possibly degenerative, although an osseous
metastasis could also have this appearance. Follow-up examination
with dedicated MRI of the cervical spine suggested for further
evaluation.
4. Mild chronic microvascular ischemic disease.

Critical Value/emergent results were called by telephone at the time
of interpretation on [DATE] at [DATE] to provider YOUNGKON
, who verbally acknowledged these results.

## 2021-11-03 MED ORDER — ONDANSETRON HCL 8 MG PO TABS: 8.0000 mg | ORAL_TABLET | Freq: Two times a day (BID) | ORAL | Status: DC | PRN

## 2021-11-03 MED ORDER — LIDOCAINE HCL 1 % IJ SOLN
INTRAMUSCULAR | Status: AC | PRN
  Administered 2021-11-03: 5 mL

## 2021-11-03 MED ORDER — HEPARIN SOD (PORK) LOCK FLUSH 100 UNIT/ML IV SOLN
INTRAVENOUS | Status: AC
  Filled 2021-11-03: qty 10

## 2021-11-03 MED ORDER — SITAGLIPTIN PHOS-METFORMIN HCL 50-500 MG PO TABS: 1.0000 | ORAL_TABLET | Freq: Two times a day (BID) | ORAL | Status: DC

## 2021-11-03 MED ORDER — LIDOCAINE HCL 1 % IJ SOLN
INTRAMUSCULAR | Status: AC
  Filled 2021-11-03: qty 20

## 2021-11-03 MED ORDER — OXYCODONE HCL 5 MG PO TABS
10.0000 mg | ORAL_TABLET | ORAL | Status: DC | PRN
  Administered 2021-11-04 – 2021-11-20 (×11): 10 mg via ORAL
  Filled 2021-11-03 (×12): qty 2

## 2021-11-03 MED ORDER — SODIUM CHLORIDE 0.9 % IV BOLUS
1000.0000 mL | Freq: Once | INTRAVENOUS | Status: AC
  Administered 2021-11-03: 1000 mL via INTRAVENOUS

## 2021-11-03 MED ORDER — HEPARIN SOD (PORK) LOCK FLUSH 100 UNIT/ML IV SOLN
INTRAVENOUS | Status: AC | PRN
  Administered 2021-11-03: 500 [IU] via INTRAVENOUS

## 2021-11-03 MED ORDER — SODIUM CHLORIDE 0.9 % IV BOLUS
500.0000 mL | Freq: Once | INTRAVENOUS | Status: AC
  Administered 2021-11-03: 500 mL via INTRAVENOUS

## 2021-11-03 MED ORDER — ALBUTEROL SULFATE (2.5 MG/3ML) 0.083% IN NEBU
3.0000 mL | INHALATION_SOLUTION | Freq: Four times a day (QID) | RESPIRATORY_TRACT | Status: DC
  Administered 2021-11-03: 3 mL via RESPIRATORY_TRACT
  Filled 2021-11-03: qty 3

## 2021-11-03 MED ORDER — PROCHLORPERAZINE MALEATE 10 MG PO TABS: 10.0000 mg | ORAL_TABLET | Freq: Four times a day (QID) | ORAL | Status: DC | PRN

## 2021-11-03 MED ORDER — INSULIN ASPART 100 UNIT/ML IJ SOLN
0.0000 [IU] | Freq: Three times a day (TID) | INTRAMUSCULAR | Status: DC
  Administered 2021-11-04 (×3): 1 [IU] via SUBCUTANEOUS
  Administered 2021-11-05: 2 [IU] via SUBCUTANEOUS
  Administered 2021-11-05 (×2): 1 [IU] via SUBCUTANEOUS
  Administered 2021-11-06: 3 [IU] via SUBCUTANEOUS
  Administered 2021-11-06: 1 [IU] via SUBCUTANEOUS
  Administered 2021-11-06: 2 [IU] via SUBCUTANEOUS
  Administered 2021-11-07: 3 [IU] via SUBCUTANEOUS
  Administered 2021-11-07 – 2021-11-08 (×4): 2 [IU] via SUBCUTANEOUS
  Administered 2021-11-09: 1 [IU] via SUBCUTANEOUS
  Administered 2021-11-09: 2 [IU] via SUBCUTANEOUS
  Administered 2021-11-09 – 2021-11-13 (×11): 1 [IU] via SUBCUTANEOUS
  Administered 2021-11-13 – 2021-11-14 (×3): 2 [IU] via SUBCUTANEOUS
  Administered 2021-11-14: 1 [IU] via SUBCUTANEOUS
  Administered 2021-11-14: 2 [IU] via SUBCUTANEOUS
  Administered 2021-11-15: 3 [IU] via SUBCUTANEOUS
  Administered 2021-11-15 (×2): 1 [IU] via SUBCUTANEOUS
  Administered 2021-11-16: 5 [IU] via SUBCUTANEOUS
  Administered 2021-11-16: 1 [IU] via SUBCUTANEOUS

## 2021-11-03 MED ORDER — SODIUM CHLORIDE 0.9 % IV SOLN
6.0000 mg | Freq: Once | INTRAVENOUS | Status: AC
  Administered 2021-11-03: 6 mg via INTRAVENOUS
  Filled 2021-11-03 (×2): qty 4

## 2021-11-03 MED ORDER — SENNOSIDES-DOCUSATE SODIUM 8.6-50 MG PO TABS: 1.0000 | ORAL_TABLET | Freq: Every evening | ORAL | Status: DC | PRN

## 2021-11-03 MED ORDER — FENTANYL 12 MCG/HR TD PT72
1.0000 | MEDICATED_PATCH | TRANSDERMAL | Status: DC
  Administered 2021-11-05 – 2021-11-14 (×4): 1 via TRANSDERMAL
  Filled 2021-11-03 (×4): qty 1

## 2021-11-03 MED ORDER — PANTOPRAZOLE SODIUM 40 MG PO TBEC
40.0000 mg | DELAYED_RELEASE_TABLET | Freq: Every day | ORAL | Status: DC
  Administered 2021-11-03 – 2021-11-05 (×3): 40 mg via ORAL
  Filled 2021-11-03 (×3): qty 1

## 2021-11-03 MED ORDER — LORAZEPAM 0.5 MG PO TABS
0.5000 mg | ORAL_TABLET | Freq: Four times a day (QID) | ORAL | Status: DC | PRN
  Administered 2021-11-18: 0.5 mg via ORAL
  Filled 2021-11-03: qty 1

## 2021-11-03 MED ORDER — SODIUM CHLORIDE 0.9 % IV SOLN: INTRAVENOUS | Status: DC

## 2021-11-03 MED ORDER — LEVOTHYROXINE SODIUM 50 MCG PO TABS
50.0000 ug | ORAL_TABLET | Freq: Every day | ORAL | Status: DC
  Administered 2021-11-04 – 2021-11-15 (×12): 50 ug via ORAL
  Filled 2021-11-03 (×12): qty 1

## 2021-11-03 MED ORDER — ALLOPURINOL 300 MG PO TABS
300.0000 mg | ORAL_TABLET | Freq: Every day | ORAL | Status: DC
  Administered 2021-11-03 – 2021-11-08 (×6): 300 mg via ORAL
  Filled 2021-11-03 (×3): qty 1
  Filled 2021-11-03: qty 3
  Filled 2021-11-03 (×2): qty 1

## 2021-11-03 MED ORDER — SODIUM CHLORIDE 0.45 % IV SOLN
INTRAVENOUS | Status: DC
  Filled 2021-11-03 (×11): qty 75

## 2021-11-03 NOTE — Assessment & Plan Note (Deleted)
Likely secondary to AKI.  Resolved.

## 2021-11-03 NOTE — H&P (Addendum)
History and Physical    Patient: Pamela [name] AXE:[id number] DOB: 08-14-1961 DOA: 11/03/2021 DOS: the patient was seen and examined on 11/03/2021 PCP: Marinda Elk, MD  Patient coming from: Home - lives with her husband. Independent with ADLs.    Chief Complaint: abnormal labs, AKI/tumor lysis   HPI: Pamela [name] is a 61 y.o. female with medical history significant of T2DM, HTN, HLD, hypothyroidism and extensive small cell lung cancer diagnosed 2 weeks ago who came to ED after her oncologist drew labs on her and asked that she come to ER with concerns for tumor lysis syndrome, AKI.   She denies any fever/chills, headaches or dizziness, chest pain or palpations, shortness of breath or coughing, no abdominal pain, some episodes of nausea and vomiting after she eats, no diarrhea. No dysuria. She has had ankle and feet swelling that is new.   Hx of smoking over 20 years, but has stopped 2 weeks ago. No alcohol.   ER Course:  vitals: afebrile, bp: 101/45, HR: 79, RR: 20, oxygen: 94%RA Pertinent labs: wbc: 13.3, hgb: 11.8, platelets: 413, uric acid: 13.6, sodium: 126, BUN: 62, creatinine: 2.34, AST: 389, ALT: 141, alkaline phosphatase: 440, total bili: 3.5, AG: 18 In ED: oncology consulted. Would like her admitted to [facility], orders placed. PICC Line ordered, MRI brain ordered. Sodium bicarb and elitek ordered by oncology. 1L bolus given. [facility] will admit with plans to transfer to [facility]     Review of Systems: As mentioned in the history of present illness. All other systems reviewed and are negative. Past Medical History:  Diagnosis Date   Colon cancer ([facility]) 2003   Diabetes mellitus without complication ([place])    Hypertension    Personal history of chemotherapy 2003   colon cancer   Uterine cancer ([place]) 2007   Past Surgical History:  Procedure Laterality Date   ABDOMINAL HYSTERECTOMY     BREAST EXCISIONAL BIOPSY Right 11/26/2010   neg/- PROLIFERATIVE FIBROCYSTIC CHANGE WITH ASSOCIATED     CHOLECYSTECTOMY     COLON SURGERY     2003   COLONOSCOPY     COLONOSCOPY WITH PROPOFOL N/A 12/11/2018   Procedure: COLONOSCOPY WITH PROPOFOL;  Surgeon: Lollie Sails, MD;  Location: [facility] ENDOSCOPY;  Service: Endoscopy;  Laterality: N/A;   Social History:  reports that she quit smoking about 2 weeks ago. Her smoking use included cigarettes. She has a 43.00 pack-year smoking history. She has never used smokeless tobacco. She reports that she does not currently use alcohol. She reports that she does not use drugs.  Allergies  Allergen Reactions   Rofecoxib Swelling    Family History  Problem Relation Age of Onset   Breast cancer Maternal Aunt 57   Breast cancer Paternal Aunt 67   Breast cancer Cousin 18   Breast cancer Cousin 57    Prior to Admission medications   Medication Sig Start Date End Date Taking? Authorizing Provider  albuterol (VENTOLIN HFA) 108 (90 Base) MCG/ACT inhaler SMARTSIG:2 Inhalation Via Inhaler Every 6 Hours PRN 01/22/21   [provider]  allopurinol (ZYLOPRIM) 300 MG tablet Take 1 tablet (300 mg total) by mouth daily. 11/02/21   Sindy Guadeloupe, MD  diclofenac sodium (VOLTAREN) 1 % GEL Apply topically 2 (two) times daily. Patient not taking: Reported on 11/02/2021    [provider]  esomeprazole (NEXIUM) 20 MG capsule Take 20 mg by mouth daily at 12 noon.    [provider]  fentaNYL (DURAGESIC) 12 MCG/HR Place  1 patch onto the skin every 3 (three) days. 11/02/21   Borders, Kirt Boys, NP  ferrous sulfate 325 (65 FE) MG EC tablet Take 325 mg by mouth daily.    [provider]  JANUMET 50-500 MG tablet Take 1 tablet by mouth 2 (two) times daily. Patient not taking: Reported on 11/02/2021 12/18/20   [provider]  levothyroxine (SYNTHROID, LEVOTHROID) 50 MCG tablet Take 50 mcg by mouth daily before breakfast.    [provider]  lidocaine-prilocaine (EMLA) cream Apply to affected area once 11/02/21   Sindy Guadeloupe,  MD  lisinopril-hydrochlorothiazide (PRINZIDE,ZESTORETIC) 10-12.5 MG tablet Take 1 tablet by mouth daily. Patient not taking: Reported on 11/02/2021    [provider]  LORazepam (ATIVAN) 0.5 MG tablet Take 1 tablet (0.5 mg total) by mouth every 6 (six) hours as needed (Nausea or vomiting). 11/02/21   Sindy Guadeloupe, MD  magnesium oxide (MAG-OX) 400 MG tablet Take 500 mg by mouth daily.    [provider]  Multiple Vitamin (MULTIVITAMIN) tablet Take 1 tablet by mouth daily.    [provider]  ondansetron (ZOFRAN) 8 MG tablet Take 1 tablet (8 mg total) by mouth 2 (two) times daily as needed for refractory nausea / vomiting. Start on day 3 after carboplatin chemo. 11/02/21   Sindy Guadeloupe, MD  Oxycodone HCl 10 MG TABS Take 1 tablet (10 mg total) by mouth every 3 (three) hours as needed (breakthrough pain). 11/02/21   Borders, Kirt Boys, NP  prochlorperazine (COMPAZINE) 10 MG tablet Take 1 tablet (10 mg total) by mouth every 6 (six) hours as needed (Nausea or vomiting). 11/02/21   Sindy Guadeloupe, MD  Semaglutide,0.25 or 0.5MG/DOS, 2 MG/1.5ML SOPN Inject into the skin. 07/25/21 11/17/21  [provider]  simvastatin (ZOCOR) 40 MG tablet Take 40 mg by mouth daily.    [provider]  tiZANidine (ZANAFLEX) 2 MG tablet Take by mouth every 8 (eight) hours as needed for muscle spasms.    [provider]    Physical Exam: Vitals:   11/03/21 1400 11/03/21 1545 11/03/21 1645 11/03/21 1715  BP: (!) 101/54 (!) 103/48 (!) 112/45 (!) 96/53  Pulse: 80 81 81 84  Resp: (!) 21 (!) 30 (!) 22 17  Temp:      TempSrc:      SpO2: 92% 95% 91% 95%   General:  Appears calm and comfortable and is in NAD Eyes:  PERRL, EOMI, normal lids, iris ENT:  grossly normal hearing, lips & tongue, mmm; missing some teeth  Neck:  no LAD, masses or thyromegaly; no carotid bruits Cardiovascular:  RRR, no m/r/g. 1+ pedal edema bilaterally  Respiratory:   CTA bilaterally with no  wheezes/rales/rhonchi.  Normal respiratory effort. Abdomen:  soft, TTP in right upper quadrant.  ND, NABS Back:   normal alignment, no CVAT Skin:  no rash or induration seen on limited exam. Liver biopsy area with no signs of infection. Very slight jaundice in face possibly  Musculoskeletal:  grossly normal tone BUE/BLE, good ROM, no bony abnormality Lower extremity:  Limited foot exam with no ulcerations.  2+ distal pulses. Psychiatric:  grossly normal mood and affect, speech fluent and appropriate, AOx3 Neurologic:  CN 2-12 grossly intact, moves all extremities in coordinated fashion, sensation intact   Radiological Exams on Admission: Independently reviewed - see discussion in A/P where applicable  IR PICC PLACEMENT LEFT >5 YRS INC IMG GUIDE  Result Date: 11/03/2021 INDICATION: Patient with a history of  small cell lung cancer and need of durable venous access for chemotherapy. Interventional radiology asked to place a PICC. EXAM: ULTRASOUND AND FLUOROSCOPIC GUIDED PICC LINE INSERTION MEDICATIONS: 1% lidocaine, 1 mL CONTRAST:  None FLUOROSCOPY TIME:  1 minute 36 seconds  (9  mGy) COMPLICATIONS: None immediate. TECHNIQUE: The procedure, risks, benefits, and alternatives were explained to the patient and informed written consent was obtained. The left upper extremity was prepped with chlorhexidine in a sterile fashion, and a sterile drape was applied covering the operative field. Maximum barrier sterile technique with sterile gowns and gloves were used for the procedure. A timeout was performed prior to the initiation of the procedure. Local anesthesia was provided with 1% lidocaine. After the overlying soft tissues were anesthetized with 1% lidocaine, a micropuncture kit was utilized to access the left brachial vein. Real-time ultrasound guidance was utilized for vascular access including the acquisition of a permanent ultrasound image documenting patency of the accessed vessel. A guidewire was  advanced to the level of the superior caval-atrial junction for measurement purposes and the PICC line was cut to length. A peel-away sheath was placed and a 43 cm, 5 [place], dual lumen was inserted to level of the superior caval-atrial junction. A post procedure spot fluoroscopic was obtained. The catheter easily aspirated and flushed and was secured in place. A dressing was placed. The patient tolerated the procedure well without immediate post procedural complication. PICC line placed by Dr. Markus Daft FINDINGS: After catheter placement, the tip lies within the superior cavoatrial junction. The catheter aspirates and flushes normally and is ready for immediate use. IMPRESSION: Successful ultrasound and fluoroscopic guided placement of a left brachial vein approach, 43 cm, 5 French, dual lumen PICC with tip at the superior caval-atrial junction. The PICC line is ready for immediate use. Read by: Soyla Dryer, NP Electronically Signed   By: Markus Daft M.D.   On: 11/03/2021 17:20    EKG: Independently reviewed.  NSR with rate 81; nonspecific ST changes with no evidence of acute ischemia. No previous ekg.    Labs on Admission: I have personally reviewed the available labs and imaging studies at the time of the admission.  Pertinent labs:   wbc: 13.3,  hgb: 11.8,  platelets: 413,  uric acid: 13.6,  sodium: 126,  BUN: 62,  creatinine: 2.34,  AST: 389,  ALT: 141,  alkaline phosphatase: 440,  total bili: 3.5,  AG: 18  Assessment and Plan: Hyperuricemia concern/risk for tumor lysis syndrome  61 year old presenting from home after labs at oncology concerning for tumor lysis syndrome in setting of extensive small cell lung cancer with mets to liver.  -admit to [facility] to [facility] per oncology on telemetry  -oncology consulted and following, Dr. Marin Olp -PICC line ordered for urgent chemo -IVF with bicarb by onoclogy, received 1L in ED -continue allopurinol -elitek ordered by oncology -trend  uric acid and f/u on oncology recommendations   AKI (acute kidney injury) ([place]) Baseline creatinine, normal 0.9 on 10/17/21 Likely secondary to uric acid/tumor lysis syndrome UA pending, strict I/O Urine studies pending Holding nephrotoxic drugs IVF, trend bmp Continues to increase, nephrology    Elevated liver enzymes Likely from substantial tumor burden in liver  Liver biopsy on 2/13   Continue to trend liver enzymes   Metabolic acidosis, increased anion gap Likely secondary to AKI Sodium bicarb ordered by oncology IVF and monitor   Hyponatremia Likely secondary to malignancy Check urine studies/TSH/plasma osmolality IVF in setting of tumor lysis  and AKI  Frequent sodium checks   Essential hypertension- (present on admission) Blood pressure very well controlled to soft on no medication Continue to monitor   Type 2 diabetes mellitus without complication, without long-term current use of insulin (HCC) a1c of 6.8 in 10/2021 Hold metformin with AKI SSI and accuchecks per protocol   Acquired hypothyroidism- (present on admission) TSH pending, last checked 07/18/2021 and slightly elevated at 5.0 Continue synthroid, add on t4.   Small cell lung cancer ([place])- (present on admission) Diagnosed 2 weeks ago with mets to liver Liver biopsy 2/13 MRI brain pending Pain likely secondary to tumor burden: continue fentanyl patch and oxycodone  Oncology following   Chronic anemia- (present on admission) Stable, continue to follow       Advance Care Planning:   Code Status: Full Code   Consults: oncology: Dr. Marin Olp   DVT Prophylaxis: scd  Family Communication: daughter in law at bedside: Edgar Frisk  Severity of Illness: The appropriate patient status for this patient is INPATIENT. Inpatient status is judged to be reasonable and necessary in order to provide the required intensity of service to ensure the patient's safety. The patient's presenting symptoms, physical  exam findings, and initial radiographic and laboratory data in the context of their chronic comorbidities is felt to place them at high risk for further clinical deterioration. Furthermore, it is not anticipated that the patient will be medically stable for discharge from the hospital within 2 midnights of admission.   * I certify that at the point of admission it is my clinical judgment that the patient will require inpatient hospital care spanning beyond 2 midnights from the point of admission due to high intensity of service, high risk for further deterioration and high frequency of surveillance required.*  Author: Orma Flaming, MD 11/03/2021 6:16 PM  For on call review [email or web address].

## 2021-11-03 NOTE — Procedures (Signed)
PROCEDURE SUMMARY:  Successful placement of image-guided double lumen PICC line to the left brachial vein. Length 43 cm. Tip at lower SVC/RA. No complications. EBL = <2 ml. Ready for use.  Please see imaging section of Epic for full dictation.   Theresa Duty, NP 11/03/2021 3:23 PM

## 2021-11-03 NOTE — ED Provider Notes (Signed)
[name] Provider Note   CSN: [id number] Arrival date & time: 11/03/21  4259     History  Chief Complaint  Patient presents with   abnormal labs    Pamela [name] is a 61 y.o. female.  60 year old female with prior medical history as detailed below presents for evaluation.  She is accompanied by her daughter.  Patient with recently diagnosed metastatic lung cancer.  Patient with known mets to the liver.  She is followed by Dr. Randa Evens with [facility].  Patient's daughter is reporting to staff here that she was advised to come to the [facility] ED for "admission, MRI of brain, and IV fluids for elevated uric acid."  The history is provided by the patient, a relative and medical records.  Illness Location:  Elevated uric acid, need for MRI brain, history of metastatic lung cancer Severity:  Moderate Onset quality:  Gradual Duration:  2 weeks Timing:  Constant Progression:  Worsening     Home Medications Prior to Admission medications   Medication Sig Start Date End Date Taking? Authorizing Provider  albuterol (VENTOLIN HFA) 108 (90 Base) MCG/ACT inhaler SMARTSIG:2 Inhalation Via Inhaler Every 6 Hours PRN 01/22/21   [provider]  allopurinol (ZYLOPRIM) 300 MG tablet Take 1 tablet (300 mg total) by mouth daily. 11/02/21   Sindy Guadeloupe, MD  diclofenac sodium (VOLTAREN) 1 % GEL Apply topically 2 (two) times daily. Patient not taking: Reported on 11/02/2021    [provider]  esomeprazole (NEXIUM) 20 MG capsule Take 20 mg by mouth daily at 12 noon.    [provider]  fentaNYL (DURAGESIC) 12 MCG/HR Place 1 patch onto the skin every 3 (three) days. 11/02/21   Borders, Kirt Boys, NP  ferrous sulfate 325 (65 FE) MG EC tablet Take 325 mg by mouth daily.    [provider]  JANUMET 50-500 MG tablet Take 1 tablet by mouth 2 (two) times daily. Patient not taking: Reported on 11/02/2021 12/18/20    [provider]  levothyroxine (SYNTHROID, LEVOTHROID) 50 MCG tablet Take 50 mcg by mouth daily before breakfast.    [provider]  lidocaine-prilocaine (EMLA) cream Apply to affected area once 11/02/21   Sindy Guadeloupe, MD  lisinopril-hydrochlorothiazide (PRINZIDE,ZESTORETIC) 10-12.5 MG tablet Take 1 tablet by mouth daily. Patient not taking: Reported on 11/02/2021    [provider]  LORazepam (ATIVAN) 0.5 MG tablet Take 1 tablet (0.5 mg total) by mouth every 6 (six) hours as needed (Nausea or vomiting). 11/02/21   Sindy Guadeloupe, MD  magnesium oxide (MAG-OX) 400 MG tablet Take 500 mg by mouth daily.    [provider]  Multiple Vitamin (MULTIVITAMIN) tablet Take 1 tablet by mouth daily.    [provider]  ondansetron (ZOFRAN) 8 MG tablet Take 1 tablet (8 mg total) by mouth 2 (two) times daily as needed for refractory nausea / vomiting. Start on day 3 after carboplatin chemo. 11/02/21   Sindy Guadeloupe, MD  Oxycodone HCl 10 MG TABS Take 1 tablet (10 mg total) by mouth every 3 (three) hours as needed (breakthrough pain). 11/02/21   Borders, Kirt Boys, NP  prochlorperazine (COMPAZINE) 10 MG tablet Take 1 tablet (10 mg total) by mouth every 6 (six) hours as needed (Nausea or vomiting). 11/02/21   Sindy Guadeloupe, MD  Semaglutide,0.25 or 0.5MG /DOS, 2 MG/1.5ML SOPN Inject into the skin. 07/25/21 11/17/21  [provider]  simvastatin (ZOCOR) 40 MG tablet  Take 40 mg by mouth daily.    [provider]  tiZANidine (ZANAFLEX) 2 MG tablet Take by mouth every 8 (eight) hours as needed for muscle spasms.    [provider]      Allergies    Rofecoxib    Review of Systems   Review of Systems  All other systems reviewed and are negative.  Physical Exam Updated Vital Signs BP (!) 101/45 (BP Location: Left Arm)    Pulse 79    Temp 97.6 F (36.4 C) (Oral)    Resp 20    SpO2 94%  Physical Exam Vitals and nursing note reviewed.   Constitutional:      General: She is not in acute distress.    Appearance: She is well-developed. She is ill-appearing.  HENT:     Head: Normocephalic and atraumatic.  Eyes:     Conjunctiva/sclera: Conjunctivae normal.     Pupils: Pupils are equal, round, and reactive to light.  Cardiovascular:     Rate and Rhythm: Normal rate and regular rhythm.     Heart sounds: Normal heart sounds.  Pulmonary:     Effort: Pulmonary effort is normal. No respiratory distress.     Breath sounds: Normal breath sounds.  Abdominal:     General: There is no distension.     Palpations: Abdomen is soft.     Tenderness: There is no abdominal tenderness.  Musculoskeletal:        General: No deformity. Normal range of motion.     Cervical back: Normal range of motion and neck supple.  Skin:    General: Skin is warm and dry.  Neurological:     General: No focal deficit present.     Mental Status: She is alert and oriented to person, place, and time. Mental status is at baseline.    ED Results / Procedures / Treatments   Labs (all labs ordered are listed, but only abnormal results are displayed) Labs Reviewed  RESP PANEL BY RT-PCR (FLU A&B, COVID) [id number]  COMPREHENSIVE METABOLIC PANEL  CBC WITH DIFFERENTIAL/PLATELET  URIC ACID  PHOSPHORUS    EKG None  Radiology No results found.  Procedures Procedures    Medications Ordered in ED Medications  sodium chloride 0.9 % bolus 1,000 mL (has no administration in time range)    ED Course/ Medical Decision Making/ A&P                           Medical Decision Making Amount and/or Complexity of Data Reviewed Labs: ordered. Radiology: ordered.  Risk Decision regarding hospitalization.    Medical Screen Complete  This patient presented to the ED with complaint of weakness, metastatic lung cancer, dehydration.  This complaint involves an extensive number of treatment options. The initial differential diagnosis includes, but is not  limited to, metabolic abnormality, metastatic cancer, etc  This presentation is: Acute, Chronic, Self-Limited, Previously Undiagnosed, Uncertain Prognosis, Complicated, Systemic Symptoms, and Threat to Life/Bodily Function  Patient presents with her daughter for today for evaluation and admission for initiation of chemotherapy.  Patient is primarily followed by Dr. Janese Banks with [facility].  Case discussed with on-call oncologist at [facility] Dr. Rogue Bussing.  Patient is to be admitted for IV fluids.  Patient with evidence of AKI on labs performed yesterday.  Patient would likely need port placement and likely initiation of chemo.  Per Dr. Rogue Bussing, Dr. Marin Olp with [facility] is aware of case.  Dr. Marin Olp  with [facility] is made aware of case.  He will consult.  Patient will require transfer to [facility] if inpatient chemo is to begin.     Co morbidities that complicated the patient's evaluation  Metastatic lung cancer   Additional history obtained:  Additional history obtained from [facility] External records from outside sources obtained and reviewed including prior ED visits and prior Inpatient records.    Lab Tests:  I ordered and personally interpreted labs.  The pertinent results include: CBC, CMP, uric acid, phosphorus, COVID, flu   Imaging Studies ordered:  I ordered imaging studies including MRI brain.   Cardiac Monitoring:  The patient was maintained on a cardiac monitor.  I personally viewed and interpreted the cardiac monitor which showed an underlying rhythm of: NSR   Medicines ordered:  I ordered medication including IVF  for IVF  Reevaluation of the patient after these medicines showed that the patient: improved   Consultations Obtained:  I consulted Oncology,  and discussed lab and imaging findings as well as pertinent plan of care.    Problem List / ED Course:  Dehydration, AKI, sent for inpatient  chemo   Reevaluation:  After the interventions noted above, I reevaluated the patient and found that they have: improved  Disposition:  After consideration of the diagnostic results and the patients response to treatment, I feel that the patent would benefit from admission.   CRITICAL CARE Performed by: Valarie Merino   Total critical care time: 45 minutes  Critical care time was exclusive of separately billable procedures and treating other patients.  Critical care was necessary to treat or prevent imminent or life-threatening deterioration.  Critical care was time spent personally by me on the following activities: development of treatment plan with patient and/or surrogate as well as nursing, discussions with consultants, evaluation of patient's response to treatment, examination of patient, obtaining history from patient or surrogate, ordering and performing treatments and interventions, ordering and review of laboratory studies, ordering and review of radiographic studies, pulse oximetry and re-evaluation of patient's condition.          Final Clinical Impression(s) / ED Diagnoses Final diagnoses:  Small cell lung cancer ([place])  Type 2 diabetes mellitus without complication, without long-term current use of insulin ([place])  Acquired hypothyroidism    Rx / DC Orders ED Discharge Orders     None         Valarie Merino, MD 11/03/21 1314

## 2021-11-03 NOTE — ED Notes (Signed)
Report given to Theatre stage manager at [facility]. [facility] called for transport

## 2021-11-03 NOTE — Assessment & Plan Note (Addendum)
Continue monitoring labs.  LDH trending down.  Uric acid down to 4.2.  Renal function stable.

## 2021-11-03 NOTE — Progress Notes (Signed)
I connected with Pamela [name] on 11/03/21 at  1:00 PM EST by video enabled telemedicine visit and verified that I am speaking with the correct person using two identifiers.   I discussed the limitations, risks, security and privacy concerns of performing an evaluation and management service by telemedicine and the availability of in-person appointments. I also discussed with the patient that there may be a patient responsible charge related to this service. The patient expressed understanding and agreed to proceed.  Other persons participating in the visit and their role in the encounter:  patiwnts daughter in law  Patient's location:  cancer center Provider's location:  home (due to illness)  Chief Complaint: Urology results and further management  Diagnosis: Extensive stage small cell lung cancer with liver metastases  History of present illness: Patient is a 61 year old female who was seen by me in July 2022 for neutrophilia.  She has been now referred for abnormal CT scan findings.  She has been having ongoing left chest wall pain which prompted a CT chest abdomen and pelvis.  CT chest shows left lower lobe lung mass with extensive mediastinal adenopathy as well as pleural metastases.  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  Patient has a history of colon cancer treated with surgery and adjuvant chemotherapy back in 2003.  She also has a history of uterine cancer in 2007 s/p hysterectomy and bilateral salpingo-oophorectomy.     Patient had CT-guided liver biopsy which was consistent with small cell lung cancer  Interval history patient continues to have significant chest wall pain for which she is using oxycodone 10 mg every 3 hours as needed but pain is still not well controlled.  She also reports right upper quadrant abdominal pain   Review of Systems  Constitutional:  Positive for malaise/fatigue and weight loss. Negative for chills and fever.  HENT:   Negative for congestion, ear discharge and nosebleeds.   Eyes:  Negative for blurred vision.  Respiratory:  Negative for cough, hemoptysis, sputum production, shortness of breath and wheezing.        Left chest wall pain  Cardiovascular:  Negative for chest pain, palpitations, orthopnea and claudication.  Gastrointestinal:  Positive for abdominal pain and nausea. Negative for blood in stool, constipation, diarrhea, heartburn, melena and vomiting.  Genitourinary:  Negative for dysuria, flank pain, frequency, hematuria and urgency.  Musculoskeletal:  Negative for back pain, joint pain and myalgias.  Skin:  Negative for rash.  Neurological:  Negative for dizziness, tingling, focal weakness, seizures, weakness and headaches.  Endo/Heme/Allergies:  Does not bruise/bleed easily.  Psychiatric/Behavioral:  Negative for depression and suicidal ideas. The patient does not have insomnia.    Allergies  Allergen Reactions   Rofecoxib Swelling    Past Medical History:  Diagnosis Date   Colon cancer ([place]) 2003   Diabetes mellitus without complication ([place])    Hypertension    Personal history of chemotherapy 2003   colon cancer   Uterine cancer ([place]) 2007    Past Surgical History:  Procedure Laterality Date   ABDOMINAL HYSTERECTOMY     BREAST EXCISIONAL BIOPSY Right 11/26/2010   neg/- PROLIFERATIVE FIBROCYSTIC CHANGE WITH ASSOCIATED    CHOLECYSTECTOMY     COLON SURGERY     2003   COLONOSCOPY     COLONOSCOPY WITH PROPOFOL N/A 12/11/2018   Procedure: COLONOSCOPY WITH PROPOFOL;  Surgeon: Lollie Sails, MD;  Location: [facility] ENDOSCOPY;  Service: Endoscopy;  Laterality: N/A;    Social History   Socioeconomic  History   Marital status: Married    Spouse name: Not on file   Number of children: Not on file   Years of education: Not on file   Highest education level: Not on file  Occupational History   Not on file  Tobacco Use   Smoking status: Former    Packs/day: 1.00    Years: 43.00     Pack years: 43.00    Types: Cigarettes    Quit date: 10/17/2021    Years since quitting: 0.0   Smokeless tobacco: Never   Tobacco comments:    1 to 2 cigarettes per day now  Vaping Use   Vaping Use: Never used  Substance and Sexual Activity   Alcohol use: Not Currently   Drug use: Never   Sexual activity: Not on file  Other Topics Concern   Not on file  Social History Narrative   Not on file   Social Determinants of Health   Financial Resource Strain: Not on file  Food Insecurity: Not on file  Transportation Needs: Not on file  Physical Activity: Not on file  Stress: Not on file  Social Connections: Not on file  Intimate Partner Violence: Not on file    Family History  Problem Relation Age of Onset   Breast cancer Maternal Aunt 60   Breast cancer Paternal Aunt 50   Breast cancer Cousin 18   Breast cancer Cousin 32     Current Outpatient Medications:    albuterol (VENTOLIN HFA) 108 (90 Base) MCG/ACT inhaler, SMARTSIG:2 Inhalation Via Inhaler Every 6 Hours PRN, Disp: , Rfl:    allopurinol (ZYLOPRIM) 300 MG tablet, Take 1 tablet (300 mg total) by mouth daily., Disp: 30 tablet, Rfl: 3   esomeprazole (NEXIUM) 20 MG capsule, Take 20 mg by mouth daily at 12 noon., Disp: , Rfl:    ferrous sulfate 325 (65 FE) MG EC tablet, Take 325 mg by mouth daily., Disp: , Rfl:    levothyroxine (SYNTHROID, LEVOTHROID) 50 MCG tablet, Take 50 mcg by mouth daily before breakfast., Disp: , Rfl:    magnesium oxide (MAG-OX) 400 MG tablet, Take 500 mg by mouth daily., Disp: , Rfl:    Multiple Vitamin (MULTIVITAMIN) tablet, Take 1 tablet by mouth daily., Disp: , Rfl:    Semaglutide,0.25 or 0.5MG /DOS, 2 MG/1.5ML SOPN, Inject into the skin., Disp: , Rfl:    simvastatin (ZOCOR) 40 MG tablet, Take 40 mg by mouth daily., Disp: , Rfl:    tiZANidine (ZANAFLEX) 2 MG tablet, Take by mouth every 8 (eight) hours as needed for muscle spasms., Disp: , Rfl:    diclofenac sodium (VOLTAREN) 1 % GEL, Apply  topically 2 (two) times daily. (Patient not taking: Reported on 11/02/2021), Disp: , Rfl:    fentaNYL (DURAGESIC) 12 MCG/HR, Place 1 patch onto the skin every 3 (three) days., Disp: 5 patch, Rfl: 0   JANUMET 50-500 MG tablet, Take 1 tablet by mouth 2 (two) times daily. (Patient not taking: Reported on 11/02/2021), Disp: , Rfl:    lidocaine-prilocaine (EMLA) cream, Apply to affected area once, Disp: 30 g, Rfl: 3   lisinopril-hydrochlorothiazide (PRINZIDE,ZESTORETIC) 10-12.5 MG tablet, Take 1 tablet by mouth daily. (Patient not taking: Reported on 11/02/2021), Disp: , Rfl:    LORazepam (ATIVAN) 0.5 MG tablet, Take 1 tablet (0.5 mg total) by mouth every 6 (six) hours as needed (Nausea or vomiting)., Disp: 30 tablet, Rfl: 0   ondansetron (ZOFRAN) 8 MG tablet, Take 1 tablet (8 mg total) by mouth 2 (  two) times daily as needed for refractory nausea / vomiting. Start on day 3 after carboplatin chemo., Disp: 30 tablet, Rfl: 1   Oxycodone HCl 10 MG TABS, Take 1 tablet (10 mg total) by mouth every 3 (three) hours as needed (breakthrough pain)., Disp: 90 tablet, Rfl: 0   prochlorperazine (COMPAZINE) 10 MG tablet, Take 1 tablet (10 mg total) by mouth every 6 (six) hours as needed (Nausea or vomiting)., Disp: 30 tablet, Rfl: 1  CT CHEST W CONTRAST  Result Date: 10/18/2021 CLINICAL DATA:  Abnormal CT of the abdomen and pelvis for which further evaluation with chest CT was suggested. EXAM: CT CHEST WITH CONTRAST TECHNIQUE: Multidetector CT imaging of the chest was performed during intravenous contrast administration. RADIATION DOSE REDUCTION: This exam was performed according to the departmental dose-optimization program which includes automated exposure control, adjustment of the mA and/or kV according to patient size and/or use of iterative reconstruction technique. CONTRAST:  70mL OMNIPAQUE IOHEXOL 300 MG/ML  SOLN COMPARISON:  Comparison made with abdominal CT of the same date. Also with chest CT from May of 2021.  FINDINGS: Cardiovascular: Calcified atheromatous plaque of the thoracic aorta. No aneurysmal dilation. Heart size is normal. There are numerous pre pericardial lymph nodes with enlargement, see below. Central pulmonary vasculature is remarkable for narrowing due to extensive LEFT hilar adenopathy of the LEFT pulmonary artery. Otherwise not well evaluated on this venous phase. Mediastinum/Nodes: Extensive mediastinal and LEFT hilar adenopathy. LEFT thoracic inlet lymph node 11 mm (image 13/2) LEFT paratracheal lymph node anterior to the esophagus (image 23/2) 14 mm. Low LEFT paratracheal blending into AP window adenopathy (image 50/2) 16 mm. Subcarinal adenopathy, 17 mm (image 60/2) LEFT hilar nodal mass obscuring LEFT pulmonary artery measuring up to 4.0 x 2.6 cm (image 61/2) Pre pericardial nodal enlargement largest anterior to the RIGHT heart (image 98/2) 18 mm. Juxta esophageal lymph node with enlargement (image 108/2) 12 mm. No RIGHT hilar adenopathy. Lungs/Pleura: Extensive pleural disease with pleural studding involving the entire LEFT chest, occasional pleural nodules tracking over the LEFT lung apex with most pronounced pleural involvement in the dependent aspect of the chest in the inferior posterior costodiaphragmatic recess. Here pleural involvement invades the fat adjacent to the aorta (image 111/2) masslike area measuring up to 2.6 x 1.9 cm is contiguous with nodular pleural thickening that extends along the entire posterior LEFT chest tracking to the mid and upper chest mixed with pleural fluid. Discrete nodules such as an area on image 77/2 track along the dependent and lateral greater than the anterior chest towards the LEFT lung apex and are scattered in terms of distribution, the largest area measuring approximately 1.8 x 1.0 cm (image 77/2) Masslike area in the superior segment of the AA LEFT lower lobe measuring 4.3 x 3.6 cm (image 49/2) this is contiguous with nodular masslike soft tissue that  extends to the LEFT hilum (image 56/2) pleural nodularity of the major fissure also demonstrated. RIGHT chest is clear. Upper Abdomen: Diffusely nodular and heterogeneous appearance of the liver with variable density throughout the liver and suggestion of masslike areas underlying steatotic changes is not well evaluated, better assessed on the prior CT of the abdomen which shows clear hepatic metastatic disease. Pancreas, visualized portions as well as the spleen without acute process. Adrenal glands are unremarkable. Musculoskeletal: Many of the areas of pleural disease along the LEFT chest appear to involve the extrapleural fat. No acute musculoskeletal process IMPRESSION: 1. Pulmonary mass with extensive interstitial disease, mediastinal adenopathy and pleural  metastases, most suspicious for bronchogenic neoplasm. 2. Diffusely nodular and heterogeneous liver show signs of metastatic disease that are better seen on the CT of the abdomen and pelvis. 3. Aortic atherosclerosis. Aortic Atherosclerosis (ICD10-I70.0). Electronically Signed   By: Zetta Bills M.D.   On: 10/18/2021 14:46   CT ABDOMEN PELVIS W CONTRAST  Result Date: 10/18/2021 CLINICAL DATA:  Elevated LFTs and abdominal pain. EXAM: CT ABDOMEN AND PELVIS WITH CONTRAST TECHNIQUE: Multidetector CT imaging of the abdomen and pelvis was performed using the standard protocol following bolus administration of intravenous contrast. RADIATION DOSE REDUCTION: This exam was performed according to the departmental dose-optimization program which includes automated exposure control, adjustment of the mA and/or kV according to patient size and/or use of iterative reconstruction technique. CONTRAST:  168mL OMNIPAQUE IOHEXOL 300 MG/ML  SOLN COMPARISON:  CT chest 01/19/2020 FINDINGS: Lower chest: Partially loculated scratch set there is a small partially loculated left pleural effusion identified. Signs of pleural spread of tumor within the left hemithorax identified  with multiple pleural based soft tissue nodules overlying the visualized portions of the left lung base. Right lung appears clear. Enlarged right cardiophrenic angle lymph node measures 1.7 cm, image 8/2. Left cardiophrenic angle lymph node is enlarged measuring 1.2 cm, image 14/2. Hepatobiliary: The liver has a pseudo cirrhotic appearance due to diffuse liver metastases: Index lesion within the posterior dome measures 2.8 x 2.8 cm, image 7/2. Posterior lateral right lobe of liver lesion measures 2.1 x 2.3 cm, image 24/2. Inferior right lobe of liver lesion measures 2.8 x 2.5 cm, image 42/2. Index lesion within the lateral segment of left hepatic lobe measures 2.1 by 2.0 cm, image 23/2 Status post cholecystectomy.  No bile duct dilatation. Pancreas: Unremarkable. No pancreatic ductal dilatation or surrounding inflammatory changes. Spleen: Normal in size without focal abnormality. Adrenals/Urinary Tract: Normal adrenal glands. No suspicious mass or hydronephrosis identified bilaterally. Small cyst within inferior pole of right kidney measures 1.5 cm. The urinary bladder is unremarkable. Stomach/Bowel: Stomach is nondistended. Postsurgical changes from right hemicolectomy with enterocolonic anastomosis. No bowel wall thickening, inflammation, or distension. Vascular/Lymphatic: Aortic atherosclerosis. No aneurysm. Upper abdominal vascularity is patent. No adenopathy identified within the abdomen or pelvis. Reproductive: Status post hysterectomy.  No adnexal mass. Other: No ascites or focal fluid collections. Musculoskeletal: No acute or significant osseous findings. IMPRESSION: 1. Diffuse liver metastases. 2. Signs of pleural spread of tumor within the left hemithorax with small partially loculated left pleural effusion as well as multiple pleural based soft tissue nodules overlying the visualized portions of the left lung base. Recommend further evaluation of the chest with dedicated contrast enhanced CT of the chest.  3. Enlarged cardiophrenic angle lymph nodes compatible with nodal metastasis. 4. Status post right hemicolectomy with enterocolonic anastomosis. 5. Aortic Atherosclerosis (ICD10-I70.0). 6. These results were called by telephone at the time of interpretation on 10/18/2021 at 1:12 pm to provider [facility] , who verbally acknowledged these results. Electronically Signed   By: Kerby Moors M.D.   On: 10/18/2021 13:13   US BIOPSY (LIVER)  Result Date: 10/29/2021 INDICATION: History of colon and uterine cancer now with liver lesions worrisome for metastatic disease. Please perform ultrasound-guided biopsy for tissue diagnostic purposes. EXAM: ULTRASOUND GUIDED LIVER LESION BIOPSY COMPARISON:  CT of the chest, abdomen pelvis-10/18/2021 MEDICATIONS: None ANESTHESIA/SEDATION: Moderate (conscious) sedation was employed during this procedure as administered by the Interventional Radiology RN. A total of Versed 0.5 mg and Fentanyl 25 mcg was administered intravenously. Moderate Sedation Time: 15 minutes. The  patient's level of consciousness and vital signs were monitored continuously by radiology nursing throughout the procedure under my direct supervision. COMPLICATIONS: None immediate. PROCEDURE: Informed written consent was obtained from the patient after a discussion of the risks, benefits and alternatives to treatment. The patient understands and consents the procedure. A timeout was performed prior to the initiation of the procedure. Ultrasound scanning was performed of the right upper abdominal quadrant demonstrates innumerable hypoechoic hepatic lesions many more than were appreciated on preceding abdominal CT. A dominant approximately 3.8 x 3.0 cm hypoechoic mass within the caudal aspect the right lobe of the liver (image 14) was targeted for biopsy given location and sonographic window. The procedure was planned. The right upper abdominal quadrant was prepped and draped in the usual sterile fashion. The  overlying soft tissues were anesthetized with 1% lidocaine with epinephrine. A 17 gauge, 6.8 cm co-axial needle was advanced into a peripheral aspect of the lesion. This was followed by 7 core biopsies with an 18 gauge core device under direct ultrasound guidance. The coaxial needle tract was embolized with a small amount of Gel-Foam slurry and superficial hemostasis was obtained with manual compression. Post procedural scanning was negative for definitive area of hemorrhage or additional complication. A dressing was placed. The patient tolerated the procedure well without immediate post procedural complication. IMPRESSION: 1. Technically successful ultrasound guided core needle biopsy of dominant mass within the caudal aspect of the right lobe liver. 2. Note, innumerable liver lesions were identified on today's procedure, many more than were questioned on preceding abdominal CT. Electronically Signed   By: Sandi Mariscal M.D.   On: 10/29/2021 10:20       CMP Latest Ref Rng & Units 11/02/2021  Glucose 70 - 99 mg/dL 125(H)  BUN 8 - 23 mg/dL 60(H)  Creatinine 0.44 - 1.00 mg/dL 2.31(H)  Sodium 135 - 145 mmol/L 126(L)  Potassium 3.5 - 5.1 mmol/L 4.9  Chloride 98 - 111 mmol/L 88(L)  CO2 22 - 32 mmol/L 22  Calcium 8.9 - 10.3 mg/dL 9.3  Total Protein 6.5 - 8.1 g/dL 6.9  Total Bilirubin 0.3 - 1.2 mg/dL 2.3(H)  Alkaline Phos 38 - 126 U/L 428(H)  AST 15 - 41 U/L 351(H)  ALT 0 - 44 U/L 115(H)   CBC Latest Ref Rng & Units 10/29/2021  WBC 4.0 - 10.5 K/uL 13.5(H)  Hemoglobin 12.0 - 15.0 g/dL 11.3(L)  Hematocrit 36.0 - 46.0 % 35.1(L)  Platelets 150 - 400 K/uL 403(H)     Observation/objective: Appears fatigued and in distress from pain  Assessment and plan: Patient is a 61 year old female with newly diagnosed extensive stage small cell lung cancer stage IV cT2 N2 M1 with liver metastases  We repeated patient's CMP today which shows further increase in her bilirubinTo 1.9 from a prior value of 1.3.  AST  and ALT are also higher.  We also obtained uric acid which is elevated at 14.9.  I had also received a call from radiology that at the time of liver biopsy patient was noted to have a liver full of metastases on ultrasound.  These labs are highly concerning for spontaneous tumor lysis syndrome from high tumor burden.  I did add a BMP to her labs today as well which resulted after the patient went home and therefore I called the patient the next day on 11/03/2021 to get admitted to the hospital for IV fluids and for further management of tumor lysis syndrome.  Discussed that for extensive stage small cell  lung cancer I would recommend combination chemoimmunotherapy comprising of carboplatin and etoposide given IV every 3 weeks for 4 cycles along with Tecentriq given IV every 3 weeks until progression or toxicity.  Discussed risks and benefits of chemotherapy including all but not limited to nausea, vomiting, low blood counts, risk of infections and hospitalization.  Etoposide dose will need to be lowered due to presence of underlying acute liver failure.  Discussed risks and benefits of immunotherapy including all but not limited to autoimmune thyroiditis colitis pneumonitis hepatitis.  Treatment will be given with a palliative intent.  Without further treatment patient's prognosis is poor likely 3 months.  With treatment overall prognosis could be up to 1 to 1-1/2-year.  Patient understands and agrees to proceed as planned  Patient will also need MRI brain with and without contrast to complete her staging work-up.  Plan was to proceed with outpatient chemotherapy starting 11/07/2021 for 3 continuous days with aggressive IV fluids.  However given the worsening AKI I have asked the patient to go to [facility] for inpatient IV hydration, need for rasburicase as well as getting first cycle of chemo inpatient given concern for tumor lysis syndrome.  Neoplasm related pain: Will be seen by palliative care today and  will be started on fentanyl patch in addition to as needed oxycodone   Cancer Staging  Small cell lung cancer [facility]) Staging form: Lung, AJCC 8th Edition - Clinical stage from 11/03/2021: Stage IVB (cT2, cN2, pM1c) - Signed by Sindy Guadeloupe, MD on 11/03/2021    Follow-up instructions: As above  I discussed the assessment and treatment plan with the patient. The patient was provided an opportunity to ask questions and all were answered. The patient agreed with the plan and demonstrated an understanding of the instructions.   The patient was advised to call back or seek an in-person evaluation if the symptoms worsen or if the condition fails to improve as anticipated.  Visit Diagnosis: 1. Small cell lung cancer ([place])   2. Goals of care, counseling/discussion     Dr. Randa Evens, MD, MPH [facility] at [facility] Tel- [phone] 11/03/2021 9:57 AM

## 2021-11-03 NOTE — Assessment & Plan Note (Addendum)
TSH elevated to 22 but free T4 within normal range at 0.85.  She reports good compliance with her Synthroid.  Although it is difficult to interpret elevated TSH in acute setting, it is reasonable to increase her Synthroid from 50 to 62.5 mcg -Repeat TSH in 4 to 6 weeks

## 2021-11-03 NOTE — Assessment & Plan Note (Addendum)
Diagnosed 2 weeks PTA S/P Liver biopsy 2/13.  Completed chemo inpatient being monitored for TLS and any adverse reaction.   -Continue pain control with fentanyl patch and oxycodone with bowel regimen. -Port-A-Cath placed on 3/7.  Remove PICC line -Oncology following.

## 2021-11-03 NOTE — Assessment & Plan Note (Addendum)
Low DBP in the 40s and 30s.  SBP within normal.  Not symptomatic -On diuretics. -Continue midodrine for BP support

## 2021-11-03 NOTE — Consult Note (Signed)
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  HPI: Pamela [name] is a very nice 61 year old white female.  She is from [place].  She is followed at the [facility].  She was recently diagnosed with metastatic/extensive stage small cell lung cancer.  She was he was started chemoimmunotherapy.  However, she declined.  She had renal insufficiency.  She had a uric acid of 14.5.  [place] is not capable of handling the complexity of the situation.  As such, she was transferred over to [place].  She will be getting inpatient chemotherapy.  We will have to move her over to [facility].  She is supposed to have an MRI of the brain to see if there is any CNS metastasis.  She is alert and oriented.  She is quite pleasant to talk to.  Her daughter-in-law is with her.  She clearly will need to have some kind of genetic testing done.  She has had colon cancer.  She had uterine cancer.  There is a history of colon cancer in the family.  There is no electrolytes back yet today.  Her BUN and creatinine were 60 and 2.3 yesterday.   there is no labs yet back yet today.  Her CBC shows white cell count 13.3.  Hemoglobin 11.8.  Platelet count 413,000.  She says she is lost some weight.  Her appetite is not that good.  She gets full fairly quickly.  She has had no diarrhea.  There is been a little bit of leg swelling.  She has had no rashes.  I would have said that her performance status is probably ECOG 1-2.    Past Medical History:  Diagnosis Date   Colon cancer ([place]) 2003   Diabetes mellitus without complication ([place])    Hypertension    Personal history of chemotherapy 2003   colon cancer   Uterine cancer ([place]) 2007  :   Past Surgical History:  Procedure Laterality Date   ABDOMINAL HYSTERECTOMY     BREAST EXCISIONAL  BIOPSY Right 11/26/2010   neg/- PROLIFERATIVE FIBROCYSTIC CHANGE WITH ASSOCIATED    CHOLECYSTECTOMY     COLON SURGERY     2003   COLONOSCOPY     COLONOSCOPY WITH PROPOFOL N/A 12/11/2018   Procedure: COLONOSCOPY WITH PROPOFOL;  Surgeon: Lollie Sails, MD;  [facility]COPY;  Service: Endoscopy;  Laterality: N/A;  :  No current facility-administered medications for this encounter.  Current Outpatient Medications:    albuterol (VENTOLIN HFA) 108 (90 Base) MCG/ACT inhaler, SMARTSIG:2 Inhalation Via Inhaler Every 6 Hours PRN, Disp: , Rfl:    allopurinol (ZYLOPRIM) 300 MG tablet, Take 1 tablet (300 mg total) by mouth daily., Disp: 30 tablet, Rfl: 3   diclofenac sodium (VOLTAREN) 1 % GEL, Apply topically 2 (two) times daily. (Patient not taking: Reported on 11/02/2021), Disp: , Rfl:    esomeprazole (NEXIUM) 20 MG capsule, Take 20 mg by mouth daily at 12 noon., Disp: , Rfl:    fentaNYL (DURAGESIC) 12 MCG/HR, Place 1 patch onto the skin every 3 (three) days., Disp: 5 patch, Rfl: 0   ferrous sulfate 325 (65 FE) MG EC tablet, Take 325 mg by mouth daily., Disp: , Rfl:    JANUMET 50-500 MG tablet, Take 1 tablet by mouth 2 (two) times daily. (Patient not taking: Reported on 11/02/2021), Disp: , Rfl:  levothyroxine (SYNTHROID, LEVOTHROID) 50 MCG tablet, Take 50 mcg by mouth daily before breakfast., Disp: , Rfl:    lidocaine-prilocaine (EMLA) cream, Apply to affected area once, Disp: 30 g, Rfl: 3   lisinopril-hydrochlorothiazide (PRINZIDE,ZESTORETIC) 10-12.5 MG tablet, Take 1 tablet by mouth daily. (Patient not taking: Reported on 11/02/2021), Disp: , Rfl:    LORazepam (ATIVAN) 0.5 MG tablet, Take 1 tablet (0.5 mg total) by mouth every 6 (six) hours as needed (Nausea or vomiting)., Disp: 30 tablet, Rfl: 0   magnesium oxide (MAG-OX) 400 MG tablet, Take 500 mg by mouth daily., Disp: , Rfl:    Multiple Vitamin (MULTIVITAMIN) tablet, Take 1 tablet by mouth daily., Disp: , Rfl:    ondansetron  (ZOFRAN) 8 MG tablet, Take 1 tablet (8 mg total) by mouth 2 (two) times daily as needed for refractory nausea / vomiting. Start on day 3 after carboplatin chemo., Disp: 30 tablet, Rfl: 1   Oxycodone HCl 10 MG TABS, Take 1 tablet (10 mg total) by mouth every 3 (three) hours as needed (breakthrough pain)., Disp: 90 tablet, Rfl: 0   prochlorperazine (COMPAZINE) 10 MG tablet, Take 1 tablet (10 mg total) by mouth every 6 (six) hours as needed (Nausea or vomiting)., Disp: 30 tablet, Rfl: 1   Semaglutide,0.25 or 0.5MG /DOS, 2 MG/1.5ML SOPN, Inject into the skin., Disp: , Rfl:    simvastatin (ZOCOR) 40 MG tablet, Take 40 mg by mouth daily., Disp: , Rfl:    tiZANidine (ZANAFLEX) 2 MG tablet, Take by mouth every 8 (eight) hours as needed for muscle spasms., Disp: , Rfl: :  :   Allergies  Allergen Reactions   Rofecoxib Swelling  :   Family History  Problem Relation Age of Onset   Breast cancer Maternal Aunt 60   Breast cancer Paternal Aunt 62   Breast cancer Cousin 18   Breast cancer Cousin 6  :   Social History   Socioeconomic History   Marital status: Married    Spouse name: Not on file   Number of children: Not on file   Years of education: Not on file   Highest education level: Not on file  Occupational History   Not on file  Tobacco Use   Smoking status: Former    Packs/day: 1.00    Years: 43.00    Pack years: 43.00    Types: Cigarettes    Quit date: 10/17/2021    Years since quitting: 0.0   Smokeless tobacco: Never   Tobacco comments:    1 to 2 cigarettes per day now  Vaping Use   Vaping Use: Never used  Substance and Sexual Activity   Alcohol use: Not Currently   Drug use: Never   Sexual activity: Not on file  Other Topics Concern   Not on file  Social History Narrative   Not on file   Social Determinants of Health   Financial Resource Strain: Not on file  Food Insecurity: Not on file  Transportation Needs: Not on file  Physical Activity: Not on file  Stress:  Not on file  Social Connections: Not on file  Intimate Partner Violence: Not on file  :  Review of Systems  Constitutional:  Positive for malaise/fatigue and weight loss.  HENT: Negative.    Eyes: Negative.   Respiratory:  Positive for shortness of breath.   Cardiovascular: Negative.   Gastrointestinal:  Positive for abdominal pain and nausea.  Genitourinary: Negative.   Musculoskeletal:  Positive for joint pain and myalgias.  Skin: Negative.  Neurological:  Positive for headaches.  Endo/Heme/Allergies: Negative.   Psychiatric/Behavioral: Negative.      Exam: Patient Vitals for the past 24 hrs:  BP Temp Temp src Pulse Resp SpO2  11/03/21 1130 (!) 90/33 -- -- 81 (!) 22 92 %  11/03/21 0949 -- 97.6 F (36.4 C) Oral -- -- --  11/03/21 0943 (!) 101/45 -- -- 79 20 94 %   Physical Exam Vitals reviewed.  HENT:     Head: Normocephalic and atraumatic.  Eyes:     Pupils: Pupils are equal, round, and reactive to light.  Cardiovascular:     Rate and Rhythm: Normal rate and regular rhythm.     Heart sounds: Normal heart sounds.  Pulmonary:     Effort: Pulmonary effort is normal.     Breath sounds: Normal breath sounds.  Abdominal:     General: Bowel sounds are normal.     Palpations: Abdomen is soft.     Comments: Abdominal exam is somewhat obese.  Her liver is below the right costal margin.  There is little bit of tenderness in the right upper quadrant.  There is no obvious fluid wave.  Musculoskeletal:        General: No tenderness or deformity. Normal range of motion.     Cervical back: Normal range of motion.  Lymphadenopathy:     Cervical: No cervical adenopathy.  Skin:    General: Skin is warm and dry.     Findings: No erythema or rash.  Neurological:     Mental Status: She is alert and oriented to person, place, and time.     Comments: Neurological exam is nonfocal.  Cranial nerves are intact.  She has good strength in upper and lower extremities.  Psychiatric:         Behavior: Behavior normal.        Thought Content: Thought content normal.        Judgment: Judgment normal.      Recent Labs    11/03/21 1105  WBC 13.3*  HGB 11.8*  HCT 36.2  PLT 413*    Recent Labs    11/02/21 1243  NA 126*  K 4.9  CL 88*  CO2 22  GLUCOSE 125*  BUN 60*  CREATININE 2.31*  CALCIUM 9.3    Blood smear review: None  Pathology: None    Assessment and Plan: Ms. Kretz is a very charming 61 year old white female.  She has extensive stage small cell lung cancer.  She has hyperuricemia.  We really need to have some lab work today.  Again, uric acid yesterday was 14.5.  I am sure its no better today.  We may need to give her some sodium bicarb in the IV fluids.  We will give her rasburicase to help with the uric acid.  I would think that her LDH is quite high.  She clearly will need to have a PICC line placed if we are going to give chemotherapy urgently.  Again she will have to be moved over to [facility] so we try to start chemotherapy on Monday, or Tuesday at the latest.  We will need to try to get her kidneys as optimal as possible.  This is incredibly complicated.  I suspect that she probably will be hospitalized for at least a week or so.  We are clearly going to have to watch out for tumor lysis.  She is quite nice.  Overall all of this, she was in good shape.  I talked to she and her daughter-in-law about this.  They understand that this is something that we can treat but cannot cure.  Our goal is to try to improve her quality of life.  Our goal is to improve her kidney function.  Our goal is to help with her activities of daily living so that she will be able to enjoy what she would like to do.  Hopefully, she will not have CNS metastasis.,  Even if she does, I would think that the chemotherapy should be able to at least help with these.  She is diabetic.  We will have to be careful with steroids.  However, I think steroids will clearly  be helpful for her.  We will try to get over to 6 E. at [facility] today.  Pamela Haw, MD  Pamela [name] 1:5

## 2021-11-03 NOTE — ED Notes (Signed)
Patient transported to MRI 

## 2021-11-03 NOTE — Assessment & Plan Note (Addendum)
Recent Labs    11/11/21 0559 11/12/21 0500 11/13/21 0506 11/14/21 0600 11/15/21 0500 11/16/21 0545 11/17/21 0551 11/18/21 0525 11/19/21 0500 11/20/21 0730  HGB 9.8* 9.2* 8.7* 11.7* 11.2* 11.2* 10.6* 10.3* 10.5* 10.3*  -H&H stable.

## 2021-11-03 NOTE — ED Notes (Signed)
[facility] to transport pt to [facility]. Report given to Theatre stage manager at [facility]. Pt axo x4, VSS upon transport.

## 2021-11-03 NOTE — Assessment & Plan Note (Addendum)
A1c 6.8% in 10/2021. Recent Labs  Lab 11/19/21 1120 11/19/21 1232 11/19/21 1647 11/19/21 2201 11/20/21 1213  GLUCAP 216* 165* 194* 198* 131*  -Continue SSI-moderate. -Continue Semglee 5 units daily -Continue holding metformin

## 2021-11-03 NOTE — Progress Notes (Signed)
Discussed with patients oncologist Dr. Janese Banks re: regarding patients , significant burden of disease/small cell cancer. Patient is in acute renal failure-spontaneous human lysis/elevated LFTs secondary to burden in the liver.   Patient has been directed to [facility] ER for admission in anticipation of inpatient chemotherapy once clinically stable/hydration, s/p Treatment for tumor lysis.  I also updated Dr. Earley Favor, on-call onc ologist at [facility] regarding the patient.   I also spoke to patients husband regarding the above plan.   GB

## 2021-11-03 NOTE — ED Notes (Signed)
Alcario Drought MD paged regarding BP [phone]

## 2021-11-03 NOTE — Progress Notes (Signed)
Received a call from ER physician from [facility]. Discussed the plan for admission/management off tumor lysis. Admission to the hospital at [facility] for inpatient chemotherapy administration. I have given a heads up to Dr.Enver at [facility] regarding the patient earlier.  GB

## 2021-11-03 NOTE — ED Triage Notes (Addendum)
Pt states she was diagnosed with Stage 4 lung cancer 2 weeks ago and was told to come to [facility] today to get a "brain scan" and "start chemo".  Reports back pain and SOB.  States she stopped smoking 2 weeks ago when she was diagnosed.  Pt does have outpatient MRI ordered for today at 10am at [facility] and was supposed to be at [facility] for IV fluids for elevated Uric Acid.  Triage RN called [place] [facility] and spoke to [facility].  States they will not be able to do MRI today since pt didn't make it to appt and would have to call scheduling dept on Monday to have it rescheduled.

## 2021-11-03 NOTE — Significant Event (Addendum)
Called by radiology:  Has small (35mm) SDH, probably subacute on MRI.  1) pt not on blood thinners currently (SCDs for DVT ppx), INR nl, platelets nl, no neuro deficits, no headache or dizziness according to admission H+P 2) consider NS consult in AM, but I strongly doubt that they will recommend any emergent intervention on a subacute SDH that's only 28mm with no mass effect in a patient that is AAOx4 with grossly in-tact neuro exam.  Will send them a secure chat for the moment.

## 2021-11-03 NOTE — Assessment & Plan Note (Addendum)
Recent Labs  Lab 11/16/21 0545 11/17/21 0551 11/18/21 0525 11/19/21 0850 11/20/21 0730  AST 93* 68* 55* 61* 58*  ALT 44 41 35 37 34  ALKPHOS 221* 208* 172* 215* 194*  BILITOT 1.3* 1.5* 1.4* 1.2 0.9  PROT 6.2* 5.8* 5.9* 6.2* 6.0*  ALBUMIN 2.7* 2.5* 3.0* 3.0* 2.8*  Improving.  Continue monitoring

## 2021-11-03 NOTE — Assessment & Plan Note (Addendum)
Recent Labs  Lab 11/14/21 0600 11/15/21 0500 11/16/21 0545 11/17/21 0551 11/18/21 0525 11/19/21 0850 11/20/21 0730  NA 134* 134* 133* 130* 128* 130* 131*  Due to hypervolemia.  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  May be hypothyroidism as well -Diuretics and Synthroid as above

## 2021-11-03 NOTE — Assessment & Plan Note (Addendum)
Recent Labs    11/10/21 0500 11/11/21 0559 11/12/21 0500 11/13/21 0506 11/14/21 0600 11/15/21 0500 11/16/21 0545 11/17/21 0551 11/18/21 0525 11/19/21 0500 11/19/21 0850  BUN 25* 19 16 17 21 21 16 18 18   --  19  CREATININE 0.81 0.63 0.62 0.66 0.59 0.58 0.58 0.61 0.73 0.67 0.80  -Discontinued metolazone on 3/5 -Closely monitor while on Lasix

## 2021-11-03 NOTE — ED Notes (Signed)
IR took patient without informing this nurse, Bicarb drip not began

## 2021-11-04 ENCOUNTER — Inpatient Hospital Stay (HOSPITAL_COMMUNITY): Payer: BC Managed Care – PPO

## 2021-11-04 DIAGNOSIS — M7989 Other specified soft tissue disorders: Secondary | ICD-10-CM | POA: Diagnosis not present

## 2021-11-04 DIAGNOSIS — C3490 Malignant neoplasm of unspecified part of unspecified bronchus or lung: Secondary | ICD-10-CM | POA: Diagnosis not present

## 2021-11-04 LAB — CBC WITH DIFFERENTIAL/PLATELET
Abs Immature Granulocytes: 0.09 10*3/uL — ABNORMAL HIGH (ref 0.00–0.07)
Basophils Absolute: 0 10*3/uL (ref 0.0–0.1)
Basophils Relative: 0 %
Eosinophils Absolute: 0 10*3/uL (ref 0.0–0.5)
Eosinophils Relative: 0 %
HCT: 33.8 % — ABNORMAL LOW (ref 36.0–46.0)
Hemoglobin: 11 g/dL — ABNORMAL LOW (ref 12.0–15.0)
Immature Granulocytes: 1 %
Lymphocytes Relative: 6 %
Lymphs Abs: 0.7 10*3/uL (ref 0.7–4.0)
MCH: 29.7 pg (ref 26.0–34.0)
MCHC: 32.5 g/dL (ref 30.0–36.0)
MCV: 91.4 fL (ref 80.0–100.0)
Monocytes Absolute: 1.3 10*3/uL — ABNORMAL HIGH (ref 0.1–1.0)
Monocytes Relative: 10 %
Neutro Abs: 10.7 10*3/uL — ABNORMAL HIGH (ref 1.7–7.7)
Neutrophils Relative %: 83 %
Platelets: 367 10*3/uL (ref 150–400)
RBC: 3.7 MIL/uL — ABNORMAL LOW (ref 3.87–5.11)
RDW: 15.9 % — ABNORMAL HIGH (ref 11.5–15.5)
WBC: 12.8 10*3/uL — ABNORMAL HIGH (ref 4.0–10.5)
nRBC: 0 % (ref 0.0–0.2)

## 2021-11-04 LAB — GLUCOSE, CAPILLARY
Glucose-Capillary: 131 mg/dL — ABNORMAL HIGH (ref 70–99)
Glucose-Capillary: 135 mg/dL — ABNORMAL HIGH (ref 70–99)
Glucose-Capillary: 121 mg/dL — ABNORMAL HIGH (ref 70–99)
Glucose-Capillary: 105 mg/dL — ABNORMAL HIGH (ref 70–99)

## 2021-11-04 LAB — COMPREHENSIVE METABOLIC PANEL
ALT: 134 U/L — ABNORMAL HIGH (ref 0–44)
AST: 362 U/L — ABNORMAL HIGH (ref 15–41)
Albumin: 2.8 g/dL — ABNORMAL LOW (ref 3.5–5.0)
Alkaline Phosphatase: 419 U/L — ABNORMAL HIGH (ref 38–126)
Anion gap: 15 (ref 5–15)
BUN: 57 mg/dL — ABNORMAL HIGH (ref 8–23)
CO2: 22 mmol/L (ref 22–32)
Calcium: 8.9 mg/dL (ref 8.9–10.3)
Chloride: 90 mmol/L — ABNORMAL LOW (ref 98–111)
Creatinine, Ser: 1.64 mg/dL — ABNORMAL HIGH (ref 0.44–1.00)
GFR, Estimated: 35 mL/min — ABNORMAL LOW (ref >60)
Glucose, Bld: 128 mg/dL — ABNORMAL HIGH (ref 70–99)
Potassium: 4.5 mmol/L (ref 3.5–5.1)
Sodium: 127 mmol/L — ABNORMAL LOW (ref 135–145)
Total Bilirubin: 3.2 mg/dL — ABNORMAL HIGH (ref 0.3–1.2)
Total Protein: 6.5 g/dL (ref 6.5–8.1)

## 2021-11-04 LAB — PROTEIN, URINE, RANDOM: Total Protein, Urine: 30 mg/dL

## 2021-11-04 LAB — SODIUM, URINE, RANDOM: Sodium, Ur: 13 mmol/L

## 2021-11-04 LAB — URIC ACID: Uric Acid, Serum: <1.5 mg/dL — ABNORMAL LOW (ref 2.5–7.1)

## 2021-11-04 LAB — LACTATE DEHYDROGENASE: LDH: 1241 U/L — ABNORMAL HIGH (ref 98–192)

## 2021-11-04 LAB — OSMOLALITY, URINE: Osmolality, Ur: 498 mOsm/kg (ref 300–900)

## 2021-11-04 MED ORDER — MELATONIN 5 MG PO TABS
5.0000 mg | ORAL_TABLET | Freq: Once | ORAL | Status: AC
  Administered 2021-11-04: 5 mg via ORAL
  Filled 2021-11-04: qty 1

## 2021-11-04 MED ORDER — BIOTENE DRY MOUTH MT LIQD
15.0000 mL | Freq: Four times a day (QID) | OROMUCOSAL | Status: DC
  Administered 2021-11-04 – 2021-11-19 (×36): 15 mL via OROMUCOSAL

## 2021-11-04 MED ORDER — FENTANYL 12 MCG/HR TD PT72
1.0000 | MEDICATED_PATCH | Freq: Once | TRANSDERMAL | Status: AC
  Administered 2021-11-04: 1 via TRANSDERMAL
  Filled 2021-11-04: qty 1

## 2021-11-04 MED ORDER — ZOLEDRONIC ACID 4 MG/5ML IV CONC
3.0000 mg | Freq: Once | INTRAVENOUS | Status: AC
  Administered 2021-11-04: 3 mg via INTRAVENOUS
  Filled 2021-11-04: qty 3.75

## 2021-11-04 MED ORDER — ALBUTEROL SULFATE (2.5 MG/3ML) 0.083% IN NEBU
3.0000 mL | INHALATION_SOLUTION | Freq: Two times a day (BID) | RESPIRATORY_TRACT | Status: DC
  Administered 2021-11-04 – 2021-11-07 (×7): 3 mL via RESPIRATORY_TRACT
  Filled 2021-11-04 (×7): qty 3

## 2021-11-04 MED ORDER — CHLORHEXIDINE GLUCONATE CLOTH 2 % EX PADS
6.0000 | MEDICATED_PAD | Freq: Every day | CUTANEOUS | Status: DC
  Administered 2021-11-04 – 2021-11-21 (×18): 6 via TOPICAL

## 2021-11-04 MED ORDER — BOOST / RESOURCE BREEZE PO LIQD CUSTOM
1.0000 | Freq: Three times a day (TID) | ORAL | Status: DC
  Administered 2021-11-05: 1 via ORAL

## 2021-11-04 MED ORDER — SENNOSIDES-DOCUSATE SODIUM 8.6-50 MG PO TABS
1.0000 | ORAL_TABLET | Freq: Two times a day (BID) | ORAL | Status: DC
  Administered 2021-11-04 – 2021-11-21 (×33): 1 via ORAL
  Filled 2021-11-04 (×35): qty 1

## 2021-11-04 MED ORDER — POLYETHYLENE GLYCOL 3350 17 G PO PACK
17.0000 g | PACK | Freq: Every day | ORAL | Status: DC
  Administered 2021-11-04 – 2021-11-20 (×6): 17 g via ORAL
  Filled 2021-11-04 (×16): qty 1

## 2021-11-04 NOTE — Progress Notes (Signed)
I am absolutely amazed and impressed as to how well things went yesterday.  I totally am so appreciative of everybody's help to try to move everything along so quickly for Pamela [name].  She has a PICC line in place.  She had MRI of the brain.  MRI of the brain did not show any intraparenchymal lesions.  She got the rasburicase.  Her uric acid level is now less than 1.5.  Her renal function is better.  Her creatinine is 1.64.  BUN 57.  Unfortunately, her liver function is somewhat worse.  Her bilirubin is 3.2.  Her LDH is 1241.  Her SGPT is 134 SGOT 362.  Her white cell count is 12.8.  Hemoglobin 11.  Platelet count 367,000.  I think we can get chemotherapy started tomorrow.  We are going to have to get chemotherapy started tomorrow.  Her liver is trying to fail.  Thankfully, with small cell lung cancer, treatment works within a week.  She is at significant risk for tumor lysis given the LDH.  We will proceed with carboplatinum/etoposide.  I will know we can do immunotherapy with this.  If not, this can slowly be done as an outpatient.  It is the chemotherapy that is way more important.  She does not complain of any pain.  She has a Duragesic patch on.  I will try to give her dose of Zometa.  This is to try to help with the bones.  Again she will have to have a reduced dose.  Her vital signs are temperature 97.5.  Pulse 83.  Blood pressure 108/54.  Her oral exam does not show any mucositis.  Lungs are relatively clear bilaterally.  Cardiac exam regular rate and rhythm.  Abdomen is obese but soft.  There is some hepatomegaly.  Bowel sounds are present.  Extremity shows no clubbing, cyanosis or edema.  Neurological exam is nonfocal.  Again, I am so impressed with everybody's help.  I totally, totally think IR for putting the PICC line in yesterday.  Because of this, we get started with treatment tomorrow.  She MUST get treatment on Monday.  I know that she will get incredible care from all the staff  on 6 E.    Pamela [name] 29:11

## 2021-11-04 NOTE — Plan of Care (Signed)

## 2021-11-04 NOTE — Progress Notes (Signed)
Called regarding incidental L parietal suspected scalp hematoma with underlying SDH. MRI brain reviewed, L posterior SDH is small, agree that it is likely a subacute SDH. Reportedly has no trauma history, this could represent a small trans-osseous metastasis but I think it's less likely. A contrasted scan would help determine this for sure, but given her renal function, this can wait until her renal function improves. If she's unable to get contrast due to developing CKD, I'd recommend a repeat non-contrast MRI in 1 month to evaluate for change. If no enhancement on contrasted MRI, small SDH should resolve without further issue and not need repeat scanning. If it enhances, will need to discuss treatment, but looks amenable to [facility].

## 2021-11-04 NOTE — Progress Notes (Signed)
PROGRESS NOTE    Pamela [name]  OHY:[id number] DOB: [birth year] DOA: 11/03/2021 PCP: Marinda Elk, MD     Brief Narrative:   Pamela [name] is a 61 y.o. female with medical history significant of T2DM, HTN, HLD, hypothyroidism and  small cell lung cancer diagnosed 2 weeks ago who came to ED after her oncologist drew labs on her and asked that she come to ER with concerns for tumor lysis syndrome, AKI.   Subjective:  She is sitting up in chair talking to her husband and her brother She denies pain Denies headache, denies prior injury to her head  Assessment & Plan:  Active Problems:   Acquired hypothyroidism   Chronic anemia   Essential hypertension   Type 2 diabetes mellitus without complication, without long-term current use of insulin (HCC)   Small cell lung cancer (HCC)   AKI (acute kidney injury) ([place])   Metabolic acidosis, increased anion gap   Hyperuricemia concern/risk for tumor lysis syndrome    Elevated liver enzymes   Hyponatremia    Assessment and Plan:  Small cell lung cancer ([place])- (present on admission)Hyperuricemia concern/risk for tumor lysis syndrome  Diagnosed 2 weeks ago with mets to liver Liver biopsy 2/13 MRI brain did not show intracranial mets, did show 5 mm subdural hematoma, C4 hypodense lesion Pain likely secondary to tumor burden: continue fentanyl patch and oxycodone  PICC line placed Management per oncology  L posterior SDH vs cancer mets? Denies prior history of injury Denies headache Nontender on exam Case discussed with neurosurgery Dr. Venetia Constable recommend repeat MRI in a month If no enhancement on contrasted MRI, small SDH should resolve without further issue and not need repeat scanning. If it enhances, will need to discuss treatment, but looks amenable to [facility]  C4 hypodense lesion Denies neck pain, nontender to cervical spine, cervical spine range of motion intact  Mid back pain/Lumbar spine pain Nontender on exam She does  not wish to go through another MRI Monitor  Elevated liver enzymes Likely from substantial tumor burden in liver  Liver biopsy on 2/13   Continue to trend liver enzymes   Hyponatremia Low urine sodium , high urine osmolarity consistent with dehydration  Continue hydration  Frequent sodium checks    AKI (acute kidney injury) (HCC)/metabolic acidosis Baseline creatinine, normal 0.9 on 10/17/21 Likely secondary to uric acid/tumor lysis syndrome UA no infection, strict I/O Holding nephrotoxic drugs IVF, trend bmp  Bilateral lower extremity pitting edema Get venous Doppler to rule out DVT  Chronic anemia- (present on admission) Stable, continue to follow   Type 2 diabetes mellitus without complication, without long-term current use of insulin (HCC) a1c of 6.8 in 10/2021 Hold metformin with AKI SSI and accuchecks per protocol   Essential hypertension- (present on admission) Blood pressure very well controlled to soft on no medication Continue to monitor   Acquired hypothyroidism- (present on admission) TSH pending, last checked 07/18/2021 and slightly elevated at 5.0 Continue synthroid, add on t4.      I have Reviewed nursing notes, Vitals, and Lab results since pt's last encounter. Pertinent lab results CBC, BMP, details please see discussion above  I ordered the following labs:  Unresulted Labs (From admission, onward)     Start     Ordered   11/04/21 0500  CBC with Differential/Platelet  Daily,   R      11/03/21 1238   11/04/21 9485  Basic metabolic panel  Once,   STAT  11/04/21 0010   11/03/21 1239  Comprehensive metabolic panel  Daily,   R      11/03/21 1238   11/03/21 1239  Uric acid  Daily,   R      11/03/21 1238   11/03/21 1239  Lactate dehydrogenase  Daily,   R      11/03/21 1238             DVT prophylaxis: SCDs Start: 11/03/21 1431 Place TED hose Start: 11/03/21 1431   Code Status:   Code Status: Full Code  Family Communication: Husband  and brother at bedside with permission Disposition:   Status is: Inpatient   Dispo: The patient is from: Home              Anticipated d/c is to: Home              Anticipated d/c date is: TBD  Antimicrobials:   Anti-infectives (From admission, onward)    None           Objective: Vitals:   11/04/21 0745 11/04/21 0943 11/04/21 2031 11/04/21 2042  BP:  (!) 109/50  (!) 111/52  Pulse:  84  81  Resp:  17  14  Temp:  (!) 97.5 F (36.4 C)  97.7 F (36.5 C)  TempSrc:  Oral  Oral  SpO2: 92% 92% 97% 91%  Weight:        Intake/Output Summary (Last 24 hours) at 11/04/2021 2239 Last data filed at 11/04/2021 2046 Gross per 24 hour  Intake 980 ml  Output --  Net 980 ml   Filed Weights   11/04/21 0008  Weight: 98 kg    Examination:  General exam: Appear weak , AAOx3 , NAD Respiratory system: Clear to auscultation. Respiratory effort normal. Cardiovascular system:  RRR.  Gastrointestinal system: Abdomen is nondistended, soft and nontender.  Normal bowel sounds heard. Central nervous system: Alert and oriented. No focal neurological deficits. Extremities: Bilateral lower extremity pitting edema Skin: No rashes, lesions or ulcers Psychiatry: Judgement and insight appear normal. Mood & affect appropriate.     Data Reviewed: I have personally reviewed  labs and visualized  imaging studies since the last encounter and formulate the plan        Scheduled Meds:  albuterol  3 mL Inhalation BID   allopurinol  300 mg Oral Daily   antiseptic oral rinse  15 mL Mouth Rinse Q6H   Chlorhexidine Gluconate Cloth  6 each Topical Daily   feeding supplement  1 Container Oral TID BM   [START ON 11/05/2021] fentaNYL  1 patch Transdermal Q72H   fentaNYL  1 patch Transdermal Once   insulin aspart  0-9 Units Subcutaneous TID WC   levothyroxine  50 mcg Oral QAC breakfast   pantoprazole  40 mg Oral Daily   polyethylene glycol  17 g Oral Daily   senna-docusate  1 tablet Oral BID    Continuous Infusions:  sodium bicarbonate in 0.45 NS mL infusion 100 mL/hr at 11/04/21 5409     LOS: 1 day      Florencia Reasons, MD PhD FACP [facility]  Available via Epic secure chat 7am-7pm for nonurgent issues Please page for urgent issues To page the attending provider between 7A-7P or the covering provider during after hours 7P-7A, please log into the web site [email or web address] and access using universal [place] password for that web site. If you do not have the password, please call the hospital operator.    11/04/2021, 10:39  PM

## 2021-11-04 NOTE — Progress Notes (Signed)
VASCULAR LAB    Bilateral lower extremity venous duplex has been performed.  See CV proc for preliminary results.   Raesha Coonrod, RVT 11/04/2021, 3:54 PM

## 2021-11-05 ENCOUNTER — Inpatient Hospital Stay: Payer: BC Managed Care – PPO

## 2021-11-05 ENCOUNTER — Encounter (HOSPITAL_COMMUNITY): Payer: BC Managed Care – PPO

## 2021-11-05 DIAGNOSIS — C3490 Malignant neoplasm of unspecified part of unspecified bronchus or lung: Secondary | ICD-10-CM | POA: Diagnosis not present

## 2021-11-05 LAB — CBC WITH DIFFERENTIAL/PLATELET
Abs Immature Granulocytes: 0.12 10*3/uL — ABNORMAL HIGH (ref 0.00–0.07)
Basophils Absolute: 0 10*3/uL (ref 0.0–0.1)
Basophils Relative: 0 %
Eosinophils Absolute: 0 10*3/uL (ref 0.0–0.5)
Eosinophils Relative: 0 %
HCT: 34.7 % — ABNORMAL LOW (ref 36.0–46.0)
Hemoglobin: 11 g/dL — ABNORMAL LOW (ref 12.0–15.0)
Immature Granulocytes: 1 %
Lymphocytes Relative: 2 %
Lymphs Abs: 0.3 10*3/uL — ABNORMAL LOW (ref 0.7–4.0)
MCH: 29.1 pg (ref 26.0–34.0)
MCHC: 31.7 g/dL (ref 30.0–36.0)
MCV: 91.8 fL (ref 80.0–100.0)
Monocytes Absolute: 0.7 10*3/uL (ref 0.1–1.0)
Monocytes Relative: 5 %
Neutro Abs: 14.4 10*3/uL — ABNORMAL HIGH (ref 1.7–7.7)
Neutrophils Relative %: 92 %
Platelets: 330 10*3/uL (ref 150–400)
RBC: 3.78 MIL/uL — ABNORMAL LOW (ref 3.87–5.11)
RDW: 16.1 % — ABNORMAL HIGH (ref 11.5–15.5)
WBC: 15.6 10*3/uL — ABNORMAL HIGH (ref 4.0–10.5)
nRBC: 0 % (ref 0.0–0.2)

## 2021-11-05 LAB — COMPREHENSIVE METABOLIC PANEL
ALT: 144 U/L — ABNORMAL HIGH (ref 0–44)
AST: 388 U/L — ABNORMAL HIGH (ref 15–41)
Albumin: 2.7 g/dL — ABNORMAL LOW (ref 3.5–5.0)
Alkaline Phosphatase: 457 U/L — ABNORMAL HIGH (ref 38–126)
Anion gap: 16 — ABNORMAL HIGH (ref 5–15)
BUN: 49 mg/dL — ABNORMAL HIGH (ref 8–23)
CO2: 23 mmol/L (ref 22–32)
Calcium: 8.6 mg/dL — ABNORMAL LOW (ref 8.9–10.3)
Chloride: 88 mmol/L — ABNORMAL LOW (ref 98–111)
Creatinine, Ser: 1.32 mg/dL — ABNORMAL HIGH (ref 0.44–1.00)
GFR, Estimated: 46 mL/min — ABNORMAL LOW (ref >60)
Glucose, Bld: 134 mg/dL — ABNORMAL HIGH (ref 70–99)
Potassium: 4.5 mmol/L (ref 3.5–5.1)
Sodium: 127 mmol/L — ABNORMAL LOW (ref 135–145)
Total Bilirubin: 4 mg/dL — ABNORMAL HIGH (ref 0.3–1.2)
Total Protein: 6.3 g/dL — ABNORMAL LOW (ref 6.5–8.1)

## 2021-11-05 LAB — GLUCOSE, CAPILLARY
Glucose-Capillary: 137 mg/dL — ABNORMAL HIGH (ref 70–99)
Glucose-Capillary: 154 mg/dL — ABNORMAL HIGH (ref 70–99)
Glucose-Capillary: 140 mg/dL — ABNORMAL HIGH (ref 70–99)

## 2021-11-05 LAB — LACTATE DEHYDROGENASE: LDH: 1199 U/L — ABNORMAL HIGH (ref 98–192)

## 2021-11-05 LAB — URIC ACID: Uric Acid, Serum: <1.5 mg/dL — ABNORMAL LOW (ref 2.5–7.1)

## 2021-11-05 MED ORDER — MELATONIN 5 MG PO TABS
5.0000 mg | ORAL_TABLET | Freq: Every day | ORAL | Status: DC
  Administered 2021-11-05: 5 mg via ORAL
  Filled 2021-11-05: qty 1

## 2021-11-05 MED ORDER — ENSURE ENLIVE PO LIQD
237.0000 mL | Freq: Two times a day (BID) | ORAL | Status: DC
  Administered 2021-11-07 – 2021-11-20 (×15): 237 mL via ORAL

## 2021-11-05 MED ORDER — SODIUM CHLORIDE 0.9 % IV SOLN
500.0000 mg | Freq: Once | INTRAVENOUS | Status: AC
  Administered 2021-11-05: 500 mg via INTRAVENOUS
  Filled 2021-11-05: qty 50

## 2021-11-05 MED ORDER — SODIUM CHLORIDE 0.9 % IV SOLN
150.0000 mg | Freq: Once | INTRAVENOUS | Status: AC
  Administered 2021-11-05: 150 mg via INTRAVENOUS
  Filled 2021-11-05: qty 5

## 2021-11-05 MED ORDER — SODIUM CHLORIDE 0.9 % IV SOLN
10.0000 mg | Freq: Once | INTRAVENOUS | Status: AC
  Administered 2021-11-05: 10 mg via INTRAVENOUS
  Filled 2021-11-05: qty 1

## 2021-11-05 MED ORDER — SODIUM CHLORIDE 0.9 % IV SOLN
50.0000 mg/m2 | Freq: Once | INTRAVENOUS | Status: AC
  Administered 2021-11-05: 100 mg via INTRAVENOUS
  Filled 2021-11-05: qty 5

## 2021-11-05 MED ORDER — ADULT MULTIVITAMIN W/MINERALS CH
1.0000 | ORAL_TABLET | Freq: Every day | ORAL | Status: DC
  Administered 2021-11-05 – 2021-11-21 (×17): 1 via ORAL
  Filled 2021-11-05 (×17): qty 1

## 2021-11-05 MED ORDER — PALONOSETRON HCL INJECTION 0.25 MG/5ML
0.2500 mg | Freq: Once | INTRAVENOUS | Status: AC
  Administered 2021-11-05: 0.25 mg via INTRAVENOUS
  Filled 2021-11-05: qty 5

## 2021-11-05 NOTE — Progress Notes (Signed)
Chaplain provided Scientist, physiological, Healthcare POA education.  Chaplain let them know how to reach her when they are ready to have paperwork signed and notarized.    Chaplain offered support and listening.    11/05/21 1300  Clinical Encounter Type  Visited With Patient and family together  Visit Type Initial;Social support  Spiritual Encounters  Spiritual Needs Brochure;Literature

## 2021-11-05 NOTE — Progress Notes (Signed)
Chaplain completed notarization of Karie's Advanced Directive.  Chaplain provided them with the original and two additional copies. Chaplain also input one in her medical chart.   Chaplain offered support.     11/05/21 1500  Clinical Encounter Type  Visited With Patient and family together  Visit Type Social support  Referral From Family  Consult/Referral To Chaplain  Spiritual Encounters  Spiritual Needs Brochure;Literature

## 2021-11-05 NOTE — Progress Notes (Signed)
PROGRESS NOTE    Pamela [name]  YWV:[id number] DOB: [birth year] DOA: 11/03/2021 PCP: Marinda Elk, MD     Brief Narrative:   Pamela [name] is a 61 y.o. female with medical history significant of T2DM, HTN, HLD, hypothyroidism and  small cell lung cancer diagnosed 2 weeks ago who came to ED after her oncologist drew labs on her and asked that she come to ER with concerns for tumor lysis syndrome, AKI.  2/20: Oncology is on board and decided to proceed with chemotherapy today.  Subjective: Patient was seen and examined today.  Denies any new complaints.  Assessment & Plan:  Active Problems:   Acquired hypothyroidism   Chronic anemia   Essential hypertension   Type 2 diabetes mellitus without complication, without long-term current use of insulin (HCC)   Small cell lung cancer (HCC)   AKI (acute kidney injury) ([place])   Metabolic acidosis, increased anion gap   Hyperuricemia concern/risk for tumor lysis syndrome    Elevated liver enzymes   Hyponatremia    Assessment and Plan:  Small cell lung cancer ([place])- (present on admission)Hyperuricemia concern/risk for tumor lysis syndrome  Diagnosed 2 weeks ago with mets to liver Liver biopsy 2/13 MRI brain did not show intracranial mets, did show 5 mm subdural hematoma, C4 hypodense lesion Pain likely secondary to tumor burden: continue fentanyl patch and oxycodone  PICC line placed Oncology is going to start with chemo today.  L posterior SDH vs cancer mets? Denies prior history of injury Denies headache Nontender on exam Case discussed with neurosurgery Dr. Venetia Constable recommend repeat MRI in a month If no enhancement on contrasted MRI, small SDH should resolve without further issue and not need repeat scanning. 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  Sodium currently stable around 127 Continue hydration  Frequent sodium checks    AKI (acute kidney injury) (HCC)/metabolic acidosis Baseline creatinine, normal 0.9 on 10/17/21.  Improving with IV hydration Likely secondary to uric acid/tumor lysis syndrome UA no infection, strict I/O Holding nephrotoxic drugs IVF, trend bmp  Bilateral lower extremity pitting edema.  Also getting some fluid for concern of tumor lysis and AKI. -Ordered venous Doppler studies -BNP with morning labs  Chronic anemia- (present on admission) Stable, continue to follow   Type 2 diabetes mellitus without complication, without long-term current use of insulin (HCC) a1c of 6.8 in 10/2021 Hold metformin with AKI SSI and accuchecks per protocol   Essential hypertension- (present on admission) Blood pressure very well controlled to soft on no medication Continue to monitor   Acquired hypothyroidism- (present on admission) TSH pending, last checked 07/18/2021 and slightly elevated at 5.0 with elevated free T4 at 1.89 -Continue Synthroid  Interventions: Ensure Enlive (each supplement provides 350kcal and 20 grams of protein), MVI  I have Reviewed nursing notes, Vitals, and Lab results since pt's last encounter. Pertinent lab results CBC, BMP, details please see discussion above  I ordered the following labs:  Unresulted Labs (  From admission, onward)     Start     Ordered   11/04/21 0500  CBC with Differential/Platelet  Daily,   R      11/03/21 1238   11/04/21 2671  Basic metabolic panel  Once,   STAT        11/04/21 0010   11/03/21 1239  Comprehensive  metabolic panel  Daily,   R      11/03/21 1238   11/03/21 1239  Uric acid  Daily,   R      11/03/21 1238   11/03/21 1239  Lactate dehydrogenase  Daily,   R      11/03/21 1238             DVT prophylaxis: Place TED hose Start: 11/05/21 0711 SCDs Start: 11/03/21 1431 Place TED hose Start: 11/03/21 1431   Code Status:   Code Status: Full Code  Family Communication: Discussed with daughter-in-law at bedside Disposition:   Status is: Inpatient   Dispo: The patient is from: Home              Anticipated d/c is to: Home              Anticipated d/c date is: TBD  Antimicrobials:   Anti-infectives (From admission, onward)    None       Objective: Vitals:   11/04/21 2042 11/05/21 0554 11/05/21 0817 11/05/21 1322  BP: (!) 111/52 (!) 104/51  105/66  Pulse: 81 88  87  Resp: 14 14  16   Temp: 97.7 F (36.5 C) (!) 97.5 F (36.4 C)  97.6 F (36.4 C)  TempSrc: Oral Oral  Oral  SpO2: 91% 91% 94% 95%  Weight:        Intake/Output Summary (Last 24 hours) at 11/05/2021 1559 Last data filed at 11/05/2021 2458 Gross per 24 hour  Intake 1920 ml  Output --  Net 1920 ml    Filed Weights   11/04/21 0008  Weight: 98 kg    Examination:  General.  Frail lady, in no acute distress. Pulmonary.  Lungs clear bilaterally, normal respiratory effort. CV.  Regular rate and rhythm, no JVD, rub or murmur. Abdomen.  Soft, nontender, nondistended, BS positive. CNS.  Alert and oriented .  No focal neurologic deficit. Extremities.  2+ LE edema, no cyanosis, pulses intact and symmetrical. Psychiatry.  Judgment and insight appears normal.    Data Reviewed: I have personally reviewed  labs and visualized  imaging studies since the last encounter and formulate the plan    Scheduled Meds:  albuterol  3 mL Inhalation BID   allopurinol  300 mg Oral Daily   antiseptic oral rinse  15 mL Mouth Rinse Q6H   Chlorhexidine Gluconate Cloth  6 each Topical Daily   [START ON 11/06/2021] feeding  supplement  237 mL Oral BID BM   fentaNYL  1 patch Transdermal Q72H   fentaNYL  1 patch Transdermal Once   insulin aspart  0-9 Units Subcutaneous TID WC   levothyroxine  50 mcg Oral QAC breakfast   multivitamin with minerals  1 tablet Oral Daily   pantoprazole  40 mg Oral Daily   polyethylene glycol  17 g Oral Daily   senna-docusate  1 tablet Oral BID   Continuous Infusions:  sodium bicarbonate in 0.45 NS mL infusion 100 mL/hr at 11/05/21 0529     LOS: 2 days   This record has been created using Systems analyst. Errors have been sought and corrected,but  may not always be located. Such creation errors do not reflect on the standard of care.    Lorella Nimrod, MD  [facility]  Available via Epic secure chat 7am-7pm for nonurgent issues Please page for urgent issues To page the attending provider between 7A-7P or the covering provider during after hours 7P-7A, please log into the web site [email or web address] and access using universal [place] password for that web site. If you do not have the password, please call the hospital operator.    11/05/2021, 3:59 PM

## 2021-11-05 NOTE — Progress Notes (Signed)
Pamela [name] is about the same.  She really had no problems yesterday.  She should be ready for chemotherapy today.  All the orders are in.  We really have to get chemo started today.  Her bilirubin is going up relatively quickly.  Regarding have to treat her to try to help with her liver.  Her prognosis clearly is tied to her impending liver failure.  The swelling in her legs is clearly because of her liver and probably portal hypertension.  She had Dopplers of her legs which were negative yesterday.  Again, everything is going to be tied to her liver improving his function.  The only way this can happen is to get chemotherapy into her.  Hopefully they can do immunotherapy along with chemotherapy as an inpatient.  However, if not, this can be done as an outpatient.  She has had no cough.  Is no shortness of breath.  Her appetite is okay.  She has had no nausea or vomiting.  There is been no bleeding.  Her white cell count is 15.6.  Hemoglobin 11.  Platelet count 330,000.  Her bilirubin is 4.  LDH is 1200.  Creatinine is 1.32.  This is improving nicely.  Her albumin is only 2.7.  Her uric acid is less than 1.5.  Her vital signs show a temperature of 97.5.  Pulse 88.  Blood pressure 104/51.  Her lungs sound clear bilaterally.  She has good air movement bilaterally.  Cardiac exam regular rate and rhythm.  Abdomen is obese but soft.  Bowel sounds are decreased.  There is no obvious fluid wave.  Her liver edge is palpable below the right costal margin.  Extremities does show 2+ edema in her legs.  Neurological exam is nonfocal.  Again, Pamela [name] has extensive stage small cell lung cancer.  This is starting to overtake her liver.  Again chemotherapy orders have all been written.  She must get started today.  Hopefully in 1 week, we will see that her LFTs are improving.  We will have to be very cautious with tumor lysis.  We will have to monitor the uric acid, potassium, calcium.  Overall, I do think she is  in decent shape.  We will have to adjust the dose of chemotherapy, particularly the VP-16 just to accommodate the hepatic insufficiency.  I do appreciate everybody's help in trying to get her to have treatment today.  I am so appreciative of everybody's efforts.  I am really not surprised.  The staff on [address] have always been so good.  [name], MD  Oswaldo Milian 41:10

## 2021-11-05 NOTE — Progress Notes (Signed)
Initial Nutrition Assessment  INTERVENTION:   -Ensure Plus High Protein po BID, each supplement provides 350 kcal and 20 grams of protein.   -Multivitamin with minerals daily  NUTRITION DIAGNOSIS:   Increased nutrient needs related to cancer and cancer related treatments as evidenced by estimated needs.  GOAL:   Patient will meet greater than or equal to 90% of their needs  MONITOR:   PO intake, Supplement acceptance, Labs, Weight trends, I & O's  REASON FOR ASSESSMENT:   Malnutrition Screening Tool    ASSESSMENT:   61 y.o. female with medical history significant of T2DM, HTN, HLD, hypothyroidism and extensive small cell lung cancer diagnosed 2 weeks ago who came to ED after her oncologist drew labs on her and asked that she come to ER with concerns for tumor lysis syndrome, AKI.  Patient in room, daughter-in-law at bedside.  Pt states she hasn't been eating well d/t early satiety. States she eats bites at most of her meals. Sips on fluid but they fill her up too. She likes to snack on fruit such as grapes and cantaloupe. DIL brought her a Consulting civil engineer protein shake in chocolate. Pt prefers protein shakes in chocolate flavor. Had Boost Breeze ordered but will switch to chocolate Ensure instead. Encouraged pt to sip on her protein shakes.   Consumed 20% of breakfast this morning. Consumed 50-80% of meals yesterday 2/19.  Per weight records, pt has gained weight. Likely from fluid gains. Admitted with swelling in BLEs.  States she has lost 22 lbs total since diagnosis 2 weeks ago. This would be significant but possibly masked by fluid.  Medications: Miralax, Senokot, Emend  Labs reviewed:  CBGs: 105-154 Low Na  NUTRITION - FOCUSED PHYSICAL EXAM:  Flowsheet Row Most Recent Value  Orbital Region No depletion  Upper Arm Region No depletion  Thoracic and Lumbar Region No depletion  Buccal Region No depletion  Temple Region No depletion  Clavicle Bone Region No  depletion  Clavicle and Acromion Bone Region No depletion  Scapular Bone Region No depletion  Dorsal Hand No depletion  Patellar Region No depletion  Anterior Thigh Region No depletion  Posterior Calf Region No depletion  Edema (RD Assessment) None  Hair Reviewed  Eyes Reviewed  Mouth Reviewed  Skin Reviewed       Diet Order:   Diet Order             Diet Carb Modified Fluid consistency: Thin; Room service appropriate? Yes  Diet effective now                   EDUCATION NEEDS:   No education needs have been identified at this time  Skin:  Skin Assessment: Reviewed RN Assessment  Last BM:  2/20 -type 2  Height:   Ht Readings from Last 1 Encounters:  10/29/21 5\' 2"  (1.575 m)    Weight:   Wt Readings from Last 1 Encounters:  11/04/21 98 kg    BMI:  Body mass index is 39.52 kg/m.  Estimated Nutritional Needs:   Kcal:  1900-2100  Protein:  85-100g  Fluid:  1.9L/day  Clayton Bibles, MS, RD, LDN Inpatient Clinical Dietitian Contact information available via Amion

## 2021-11-05 NOTE — Progress Notes (Signed)
SCr 1.32 today. Per Dr. Antonieta Pert orders will change carboplatin dose to 500 mg today for AUC of 5.

## 2021-11-06 DIAGNOSIS — C3490 Malignant neoplasm of unspecified part of unspecified bronchus or lung: Secondary | ICD-10-CM | POA: Diagnosis not present

## 2021-11-06 LAB — CBC WITH DIFFERENTIAL/PLATELET
Abs Immature Granulocytes: 0.05 10*3/uL (ref 0.00–0.07)
Basophils Absolute: 0 10*3/uL (ref 0.0–0.1)
Basophils Relative: 0 %
Eosinophils Absolute: 0 10*3/uL (ref 0.0–0.5)
Eosinophils Relative: 0 %
HCT: 34.6 % — ABNORMAL LOW (ref 36.0–46.0)
Hemoglobin: 11.1 g/dL — ABNORMAL LOW (ref 12.0–15.0)
Immature Granulocytes: 0 %
Lymphocytes Relative: 2 %
Lymphs Abs: 0.2 10*3/uL — ABNORMAL LOW (ref 0.7–4.0)
MCH: 29.5 pg (ref 26.0–34.0)
MCHC: 32.1 g/dL (ref 30.0–36.0)
MCV: 92 fL (ref 80.0–100.0)
Monocytes Absolute: 0.8 10*3/uL (ref 0.1–1.0)
Monocytes Relative: 7 %
Neutro Abs: 10.1 10*3/uL — ABNORMAL HIGH (ref 1.7–7.7)
Neutrophils Relative %: 91 %
Platelets: 266 10*3/uL (ref 150–400)
RBC: 3.76 MIL/uL — ABNORMAL LOW (ref 3.87–5.11)
RDW: 16.3 % — ABNORMAL HIGH (ref 11.5–15.5)
WBC: 11.2 10*3/uL — ABNORMAL HIGH (ref 4.0–10.5)
nRBC: 0 % (ref 0.0–0.2)

## 2021-11-06 LAB — COMPREHENSIVE METABOLIC PANEL
ALT: 142 U/L — ABNORMAL HIGH (ref 0–44)
AST: 369 U/L — ABNORMAL HIGH (ref 15–41)
Albumin: 2.6 g/dL — ABNORMAL LOW (ref 3.5–5.0)
Alkaline Phosphatase: 440 U/L — ABNORMAL HIGH (ref 38–126)
Anion gap: 11 (ref 5–15)
BUN: 40 mg/dL — ABNORMAL HIGH (ref 8–23)
CO2: 27 mmol/L (ref 22–32)
Calcium: 7.8 mg/dL — ABNORMAL LOW (ref 8.9–10.3)
Chloride: 89 mmol/L — ABNORMAL LOW (ref 98–111)
Creatinine, Ser: 1.09 mg/dL — ABNORMAL HIGH (ref 0.44–1.00)
GFR, Estimated: 58 mL/min — ABNORMAL LOW (ref >60)
Glucose, Bld: 137 mg/dL — ABNORMAL HIGH (ref 70–99)
Potassium: 4.5 mmol/L (ref 3.5–5.1)
Sodium: 127 mmol/L — ABNORMAL LOW (ref 135–145)
Total Bilirubin: 3.6 mg/dL — ABNORMAL HIGH (ref 0.3–1.2)
Total Protein: 6.2 g/dL — ABNORMAL LOW (ref 6.5–8.1)

## 2021-11-06 LAB — GLUCOSE, CAPILLARY
Glucose-Capillary: 174 mg/dL — ABNORMAL HIGH (ref 70–99)
Glucose-Capillary: 150 mg/dL — ABNORMAL HIGH (ref 70–99)
Glucose-Capillary: 212 mg/dL — ABNORMAL HIGH (ref 70–99)
Glucose-Capillary: 173 mg/dL — ABNORMAL HIGH (ref 70–99)
Glucose-Capillary: 180 mg/dL — ABNORMAL HIGH (ref 70–99)

## 2021-11-06 LAB — BRAIN NATRIURETIC PEPTIDE: B Natriuretic Peptide: 169.1 pg/mL — ABNORMAL HIGH (ref 0.0–100.0)

## 2021-11-06 LAB — LACTATE DEHYDROGENASE: LDH: 1140 U/L — ABNORMAL HIGH (ref 98–192)

## 2021-11-06 LAB — URIC ACID: Uric Acid, Serum: 1.5 mg/dL — ABNORMAL LOW (ref 2.5–7.1)

## 2021-11-06 MED ORDER — PANTOPRAZOLE SODIUM 40 MG PO TBEC
40.0000 mg | DELAYED_RELEASE_TABLET | Freq: Two times a day (BID) | ORAL | Status: DC
  Administered 2021-11-06 – 2021-11-21 (×31): 40 mg via ORAL
  Filled 2021-11-06 (×31): qty 1

## 2021-11-06 MED ORDER — SODIUM CHLORIDE 0.9 % IV SOLN
50.0000 mg/m2 | Freq: Once | INTRAVENOUS | Status: AC
  Administered 2021-11-06: 100 mg via INTRAVENOUS
  Filled 2021-11-06: qty 5

## 2021-11-06 MED ORDER — TEMAZEPAM 15 MG PO CAPS
15.0000 mg | ORAL_CAPSULE | Freq: Every evening | ORAL | Status: DC | PRN
  Administered 2021-11-06 – 2021-11-20 (×12): 15 mg via ORAL
  Filled 2021-11-06 (×12): qty 1

## 2021-11-06 MED ORDER — ALUM & MAG HYDROXIDE-SIMETH 200-200-20 MG/5ML PO SUSP
30.0000 mL | Freq: Four times a day (QID) | ORAL | Status: DC | PRN
  Administered 2021-11-07: 30 mL via ORAL
  Filled 2021-11-06: qty 30

## 2021-11-06 MED ORDER — SODIUM CHLORIDE 0.9 % IV SOLN
10.0000 mg | Freq: Once | INTRAVENOUS | Status: AC
  Administered 2021-11-06: 10 mg via INTRAVENOUS
  Filled 2021-11-06: qty 1

## 2021-11-06 NOTE — TOC Initial Note (Signed)
Transition of Care [facility]) - Initial/Assessment Note    Patient Details  Name: Pamela [name] MRN: [id number] Date of Birth: Feb 22, 1961  Transition of Care [facility]) CM/SW Contact:    Lynnell Catalan, RN Phone Number: 11/06/2021, 1:55 PM    Transition of Care (TOC) Screening Note   Patient Details  Name: Pamela [name] Date of Birth: 05/18/1961   Transition of Care [facility]) CM/SW Contact:    Christophr Calix, Marjie Skiff, RN Phone Number: 11/06/2021, 1:55 PM    Transition of Care Department [facility]) has reviewed patient and no TOC needs have been identified at this time. We will continue to monitor patient advancement through interdisciplinary progression rounds. If new patient transition needs arise, please place a TOC consult.       Barriers to Discharge: Continued Medical Work up     Activities of Daily Living Home Assistive Devices/Equipment: None ADL Screening (condition at time of admission) Patient's cognitive ability adequate to safely complete daily activities?: Yes Is the patient deaf or have difficulty hearing?: No Does the patient have difficulty seeing, even when wearing glasses/contacts?: No Does the patient have difficulty concentrating, remembering, or making decisions?: No Patient able to express need for assistance with ADLs?: Yes Does the patient have difficulty dressing or bathing?: Yes Independently performs ADLs?: No Communication: Needs assistance Is this a change from baseline?: Pre-admission baseline Dressing (OT): Needs assistance Is this a change from baseline?: Pre-admission baseline Feeding: Independent Bathing: Needs assistance Is this a change from baseline?: Pre-admission baseline Toileting: Needs assistance Is this a change from baseline?: Change from baseline, expected to last <3 days In/Out Bed: Independent Walks in Home: Independent Does the patient have difficulty walking or climbing stairs?: No Weakness of Legs: None Weakness of Arms/Hands:  None   Admission diagnosis:  Small cell lung cancer ([place]) [C34.90] Acquired hypothyroidism [E03.9] Type 2 diabetes mellitus without complication, without long-term current use of insulin ([place]) [E11.9] Small cell lung cancer in adult [facility]) [C34.90] Patient Active Problem List   Diagnosis Date Noted   AKI (acute kidney injury) ([name]) 54/56/2563   Metabolic acidosis, increased anion gap 11/03/2021   Hyperuricemia concern/risk for tumor lysis syndrome  11/03/2021   Elevated liver enzymes 11/03/2021   Hyponatremia 11/03/2021   Small cell lung cancer ([place]) 11/02/2021   Chronic anemia 03/26/2018   Acquired hypothyroidism 12/17/2017   Postmenopausal 12/17/2017   Primary osteoarthritis involving multiple joints 09/21/2017   Trochanteric bursitis of left hip 09/21/2017   Essential hypertension 06/17/2016   Type 2 diabetes mellitus without complication, without long-term current use of insulin ([place]) 06/17/2016   PCP:  Marinda Elk, MD Pharmacy:   [facility] [place], [address] [place] [address] Phone: [phone] Fax: [phone]     Social Determinants of Health (SDOH) Interventions    Readmission Risk Interventions Readmission Risk Prevention Plan 11/06/2021  Transportation Screening Complete  PCP or Specialist Appt within 3-5 Days Complete  HRI or Home Care Consult Complete  Social Work Consult for [place] Planning/Counseling Complete  Palliative Care Screening Not Applicable  Medication Review Press photographer) Complete  Some recent data might be hidden

## 2021-11-06 NOTE — Progress Notes (Signed)
Pamela [name] had a first day of chemotherapy yesterday.  She tolerated this very well.  We will go ahead with just etoposide today and tomorrow.  She has had no problems with nausea or vomiting.  She is not sleeping.  The melatonin really is not helping her.  We will see about trying her on some Restoril.  There is no pain.  She has had a little bit of abdominal discomfort.  I will change the Protonix to twice daily dosing.  Her labs show that her kidneys are doing great.  Her BUN is 40 creatinine 1.09.  Her LFTs show a bilirubin of 3.6.  Alkaline phosphatase of 440.  Her SGPT 142 and SGOT 349.  The sodium is 127 and chloride is 89.  This might indicate a little bit of hypovolemia.  Her white cell count is 11.2.  Hemoglobin 11.1.  Platelet count 266,000.  She has had no bleeding.  There has been no diarrhea.  Her LDH is 1140.  Her vital signs are temperature 97.8.  Pulse 78.  Blood pressure 113/39.  Her lungs are clear.  Cardiac exam regular rate and rhythm.  Her oral exam does not show any mucositis.  Abdomen is soft.  She is somewhat obese.  There is no fluid wave.  There is no guarding or rebound tenderness.  Her liver edge is below the right costal margin.  Extremities shows compression stockings on.  They are knee-highs and not thigh-highs.  Seems to be a little bit less edema in her legs.  Again, Pamela [name] has the extensive stage small cell lung cancer.  She is doing well so far.  Again she is on had 1 dose of treatment.  At least, her liver tests are not going up.  Her kidneys are improving.  I know we still have a long ways to go.  I know that she is getting incredible care from all the staff up on 6[address].  Pamela Haw, MD  Darlyn Chamber 17:14

## 2021-11-06 NOTE — Progress Notes (Deleted)
BS 105 at 9 pm per [employer], it did not communicate to system.

## 2021-11-06 NOTE — Hospital Course (Addendum)
She48 year old F with recent diagnosis of small cell lung cancer with mets who started chemo on 2/20, DM-2, HTN, HLD and hypothyroidism admitted from oncology office with AKI and concern for TLS.  Pamela [name] completed first cycle of chemotherapy.  Pamela [name] developed pancytopenia.  Started on Granix. Monitoring labs including LFT and LDH due to risk for TLS.  There is also concern about possible pneumonia and BLE cellulitis with significant edema.  TTE with LVEF of 60 to 65%, G1 DD and normal RVSP.  Started on CTX, Zithromax and Lasix.  TSH elevated to 20.  Synthroid increased.  Antibiotics escalated to IV meropenem by oncology due to worsening neutropenia. Received platelet transfusion on 3/4 for thrombocytopenia to 8.   On 3/5-neutropenia and thrombocytopenia started to improve.  Worsening BLE edema and erythema with weeping and discoloration despite meropenem and diuretics.  Sent superficial culture, added vancomycin and consulted ID.  Per ID low suspicion for cellulitis, likely an adverse drug reaction, and recommended monitoring of antibiotics and considering surgery consult for punch biopsy of skin, trial of steroid and transfer to tertiary care for dermatology evaluation.   However, patient's leg pain improved after wrapping all the way to the knee.  Pancytopenia improved as well.  Pamela [name] had Port-A-Cath placed on 3/7.  Likely discharge home on 3/8 per oncology.  Pamela [name] needs outpatient wound clinic follow-up for daily dressing changing and wrapping.  Continue IV Lasix in house.

## 2021-11-06 NOTE — Progress Notes (Addendum)
PROGRESS NOTE Pamela [name]  HFW:[id number] DOB: 1961-01-10 DOA: 11/03/2021 PCP: Marinda Elk, MD   Brief Narrative/Hospital Course: 61 year old female with diabetes hypertension hyperlipidemia hypothyroidism , chronic anemia with extensive stage small cell lung cancer diagnosed 2 weeks prior to admission presented with concern for tumor lysis syndrome AKI from oncology office referral.She had liver bx done 2/13 admitted for AKI/hyperuricemia tumor lysis risk. Seen by [facility] line placed for urgent chemo. She is maanged for elevated liver enzymes likely secondary to tumor burden.  Having ongoing pain go with fentanyl patch oxycodone.  Started on chemotherapy 2/20.  Patient had lower leg edema negative for DVT 11/04/21. 6 10/08/2018 small cell lung cancer extensive stage with concern for tumor lysis syndrome, left posterior SDH versus cancer mets, C4 hypodense lesion, back pain, transaminitis, hyponatremia, AKI    Subjective: C/o stomach pain, leg edema better. Daughter at bedside on RA, apepars weak. No OTHER complaints. Sodium same at 127, creat 1.0 ast/alt 388/144>369/142.  Assessment and Plan:  Extensive stage small cell lung cancer-diagnosed 2 weeks ago liver biopsy 2/13 Hyperuricemia concern/risk for tumor lysis syndrome:  MRI brain no intracranial mets but with 5 mm subdural hematoma, subdural hypodense lesion. S/P chemo 2/20, Dr. Marin Olp on board planning for etoposide today and tomorrow.  Continue to monitor LFTs uric acid renal function and other lab markers.  Elevated liver enzymes: Likely in the setting of substantial tumor burden in the liver.  Monitor LFTs Recent Labs  Lab 11/02/21 1243 11/03/21 1105 11/04/21 0245 11/05/21 0537 11/06/21 0525  AST 351* 389* 362* 388* 369*  ALT 115* 141* 134* 144* 142*  ALKPHOS 428* 440* 419* 457* 440*  BILITOT 2.3* 3.5* 3.2* 4.0* 3.6*  PROT 6.9 6.5 6.5 6.3* 6.2*  ALBUMIN 3.0* 2.7* 2.8* 2.7* 2.6*     C4 hypodense lesion Mid back  pain L Post SDH vs Cancer mets: Seen in MRI.  See #1 continue pain control supportive care.  Patient does not wish to go to through another MRI.  AKI W/ metabolic acidosis: AKI resolved.  B/L LE edema: Dopplers negative for DVT, BNP borderline 169 patient reports its improving continue compression stocking and elevate the leg  Hyponatremia: Suspect some component of dehydration as well as question SIADH due to small cell lung cancer.  Monitor sodium stable at 1.7  Acquired hypothyroidism: Continue home Synthroid  Chronic anemia: Monitor hemoglobin Recent Labs  Lab 11/03/21 1105 11/04/21 0245 11/05/21 0537 11/06/21 0525  HGB 11.8* 11.0* 11.0* 11.1*  HCT 36.2 33.8* 34.7* 34.6*    Leukocytosis likely reactive.  Monitor Essential hypertension: BP stable soft at times not on meds  T2DM: Well-controlled blood sugar holding metformin Recent Labs  Lab 11/05/21 1128 11/05/21 1636 11/05/21 2035 11/06/21 0740 11/06/21 1212  GLUCAP 154* 140* 174* 150* 212*    Class II Obesity:Patient's Body mass index is 39.52 kg/m. : Will benefit with PCP follow-up, weight loss  healthy lifestyle and outpatient sleep evaluation.  DVT prophylaxis: Place TED hose Start: 11/05/21 0711 SCDs Start: 11/03/21 1431 Place TED hose Start: 11/03/21 1431 Code Status:   Code Status: Full Code Family Communication: plan of care discussed with patient/family at bedside.  Disposition: Currently not medically stable for discharge. Status is: Inpatient Remains inpatient appropriate because: Ongoing management of cancer and chemo  Objective: Vitals last 24 hrs: Vitals:   11/05/21 1322 11/05/21 2034 11/06/21 0401 11/06/21 0740  BP: 105/66 (!) 101/59 (!) 113/39   Pulse: 87 84 78   Resp: 16  18  Temp: 97.6 F (36.4 C) 97.8 F (36.6 C) 97.8 F (36.6 C)   TempSrc: Oral Oral Oral   SpO2: 95% 91% 97% 90%  Weight:       Weight change:   Physical Examination: General exam: Aa0c3, ill looking,older than  stated age, weak appearing. HEENT:Oral mucosa moist, Ear/Nose WNL grossly, dentition normal. Respiratory system: bilaterally diminished BS, no use of accessory muscle Cardiovascular system: S1 & S2 +, No JVD,. Gastrointestinal system: Abdomen soft,NT,ND, BS+ Nervous System:Alert, awake, moving extremities and grossly nonfocal Extremities: LE edema moderate,distal peripheral pulses palpable.  Skin: No rashes,no icterus. MSK: Normal muscle bulk,tone, power  Medications reviewed: Scheduled Meds:  albuterol  3 mL Inhalation BID   allopurinol  300 mg Oral Daily   antiseptic oral rinse  15 mL Mouth Rinse Q6H   Chlorhexidine Gluconate Cloth  6 each Topical Daily   etoposide  50 mg/m2 (Treatment Plan Recorded) Intravenous Once   feeding supplement  237 mL Oral BID BM   fentaNYL  1 patch Transdermal Q72H   fentaNYL  1 patch Transdermal Once   insulin aspart  0-9 Units Subcutaneous TID WC   levothyroxine  50 mcg Oral QAC breakfast   multivitamin with minerals  1 tablet Oral Daily   pantoprazole  40 mg Oral BID   polyethylene glycol  17 g Oral Daily   senna-docusate  1 tablet Oral BID   Continuous Infusions:  sodium bicarbonate in 0.45 NS mL infusion 100 mL/hr at 11/06/21 3329      Diet Order             Diet Carb Modified Fluid consistency: Thin; Room service appropriate? Yes  Diet effective now                    Nutrition Problem: Increased nutrient needs Etiology: cancer and cancer related treatments Signs/Symptoms: estimated needs Interventions: Ensure Enlive (each supplement provides 350kcal and 20 grams of protein), MVI   Intake/Output Summary (Last 24 hours) at 11/06/2021 1245 Last data filed at 11/06/2021 0940 Gross per 24 hour  Intake 480 ml  Output --  Net 480 ml   Net IO Since Admission: 4,686.18 mL [11/06/21 1245]  Wt Readings from Last 3 Encounters:  11/04/21 98 kg  11/02/21 92.5 kg  10/29/21 91.2 kg     Unresulted Labs (From admission, onward)      Start     Ordered   11/04/21 0500  CBC with Differential/Platelet  Daily,   R      11/03/21 1238   11/04/21 5188  Basic metabolic panel  Once,   STAT        11/04/21 0010   11/03/21 1239  Comprehensive metabolic panel  Daily,   R      11/03/21 1238   11/03/21 1239  Uric acid  Daily,   R      11/03/21 1238   11/03/21 1239  Lactate dehydrogenase  Daily,   R      11/03/21 1238          Data Reviewed: I have personally reviewed following labs and imaging studies CBC: Recent Labs  Lab 11/03/21 1105 11/04/21 0245 11/05/21 0537 11/06/21 0525  WBC 13.3* 12.8* 15.6* 11.2*  NEUTROABS 12.0* 10.7* 14.4* 10.1*  HGB 11.8* 11.0* 11.0* 11.1*  HCT 36.2 33.8* 34.7* 34.6*  MCV 91.0 91.4 91.8 92.0  PLT 413* 367 330 416   Basic Metabolic Panel: Recent Labs  Lab 11/02/21 1243 11/03/21 1105 11/03/21 1725 11/04/21  0245 11/05/21 0537 11/06/21 0525  NA 126* 126* 125* 127* 127* 127*  K 4.9 4.9 5.0 4.5 4.5 4.5  CL 88* 89* 91* 90* 88* 89*  CO2 22 19* 20* 22 23 27   GLUCOSE 125* 121* 113* 128* 134* 137*  BUN 60* 62* 58* 57* 49* 40*  CREATININE 2.31* 2.34* 2.08* 1.64* 1.32* 1.09*  CALCIUM 9.3 9.4 9.0 8.9 8.6* 7.8*  PHOS 3.9 5.0*  --   --   --   --    GFR: Estimated Creatinine Clearance: 59.3 mL/min (A) (by C-G formula based on SCr of 1.09 mg/dL (H)). Liver Function Tests: Recent Labs  Lab 11/02/21 1243 11/03/21 1105 11/04/21 0245 11/05/21 0537 11/06/21 0525  AST 351* 389* 362* 388* 369*  ALT 115* 141* 134* 144* 142*  ALKPHOS 428* 440* 419* 457* 440*  BILITOT 2.3* 3.5* 3.2* 4.0* 3.6*  PROT 6.9 6.5 6.5 6.3* 6.2*  ALBUMIN 3.0* 2.7* 2.8* 2.7* 2.6*   No results for input(s): LIPASE, AMYLASE in the last 168 hours. No results for input(s): AMMONIA in the last 168 hours. Coagulation Profile: No results for input(s): INR, PROTIME in the last 168 hours. Cardiac Enzymes: No results for input(s): CKTOTAL, CKMB, CKMBINDEX, TROPONINI in the last 168 hours. BNP (last 3 results) No results  for input(s): PROBNP in the last 8760 hours. HbA1C: No results for input(s): HGBA1C in the last 72 hours. CBG: Recent Labs  Lab 11/05/21 1128 11/05/21 1636 11/05/21 2035 11/06/21 0740 11/06/21 1212  GLUCAP 154* 140* 174* 150* 212*   Lipid Profile: No results for input(s): CHOL, HDL, LDLCALC, TRIG, CHOLHDL, LDLDIRECT in the last 72 hours. Thyroid Function Tests: Recent Labs    11/03/21 1725  TSH 10.226*  FREET4 1.89*   Anemia Panel: No results for input(s): VITAMINB12, FOLATE, FERRITIN, TIBC, IRON, RETICCTPCT in the last 72 hours. Sepsis Labs: No results for input(s): PROCALCITON, LATICACIDVEN in the last 168 hours.  Recent Results (from the past 240 hour(s))  Resp Panel by RT-PCR (Flu A&B, Covid) Nasopharyngeal Swab     Status: None   Collection Time: 11/03/21 11:50 AM   Specimen: Nasopharyngeal Swab; Nasopharyngeal(NP) swabs in vial transport medium  Result Value Ref Range Status   SARS Coronavirus 2 by RT PCR NEGATIVE NEGATIVE Final    Comment: (NOTE) SARS-CoV-2 target nucleic acids are NOT DETECTED.  The SARS-CoV-2 RNA is generally detectable in upper respiratory specimens during the acute phase of infection. The lowest concentration of SARS-CoV-2 viral copies this assay can detect is 138 copies/mL. A negative result does not preclude SARS-Cov-2 infection and should not be used as the sole basis for treatment or other patient management decisions. A negative result may occur with  improper specimen collection/handling, submission of specimen other than nasopharyngeal swab, presence of viral mutation(s) within the areas targeted by this assay, and inadequate number of viral copies(<138 copies/mL). A negative result must be combined with clinical observations, patient history, and epidemiological information. The expected result is Negative.  Fact Sheet for Patients:  EntrepreneurPulse.com.au  Fact Sheet for Healthcare Providers:   IncredibleEmployment.be  This test is no t yet approved or cleared by the [place] FDA and  has been authorized for detection and/or diagnosis of SARS-CoV-2 by FDA under an Emergency Use Authorization (EUA). This EUA will remain  in effect (meaning this test can be used) for the duration of the COVID-19 declaration under Section 564(b)(1) of the Act, 21 U.S.C.section 360bbb-3(b)(1), unless the authorization is terminated  or revoked sooner.  Influenza A by PCR NEGATIVE NEGATIVE Final   Influenza B by PCR NEGATIVE NEGATIVE Final    Comment: (NOTE) The Xpert Xpress SARS-CoV-2/FLU/RSV plus assay is intended as an aid in the diagnosis of influenza from Nasopharyngeal swab specimens and should not be used as a sole basis for treatment. Nasal washings and aspirates are unacceptable for Xpert Xpress SARS-CoV-2/FLU/RSV testing.  Fact Sheet for Patients: EntrepreneurPulse.com.au  Fact Sheet for Healthcare Providers: IncredibleEmployment.be  This test is not yet approved or cleared by the [place] FDA and has been authorized for detection and/or diagnosis of SARS-CoV-2 by FDA under an Emergency Use Authorization (EUA). This EUA will remain in effect (meaning this test can be used) for the duration of the COVID-19 declaration under Section 564(b)(1) of the Act, 21 U.S.C. section 360bbb-3(b)(1), unless the authorization is terminated or revoked.  Performed at [facility], [place] [address], [place], [place] [address]     Antimicrobials: Anti-infectives (From admission, onward)    None      Culture/Microbiology No results found for: SDES, SPECREQUEST, CULT, REPTSTATUS  Other culture-see note   Radiology Studies: VAS [name] LOWER EXTREMITY VENOUS (DVT)  Result Date: 11/05/2021  Lower Venous DVT Study Patient Name:  Pamela [name]  Date of Exam:   11/04/2021 Medical Rec #: [id number]     Accession #:    [id number]  Date of Birth: April 24, 1961     Patient Gender: F Patient Age:   79 years Exam Location:  [facility] Procedure:      VAS [name] LOWER EXTREMITY VENOUS (DVT) Referring Phys: Annamaria Boots XU --------------------------------------------------------------------------------  Indications: Swelling.  Risk Factors: Cancer Small cell lung with liver mets. Limitations: Significant edema. Comparison Study: No prior study on file Performing Technologist: Sharion Dove RVS  Examination Guidelines: A complete evaluation includes B-mode imaging, spectral Doppler, color Doppler, and power Doppler as needed of all accessible portions of each vessel. Bilateral testing is considered an integral part of a complete examination. Limited examinations for reoccurring indications may be performed as noted. The reflux portion of the exam is performed with the patient in reverse Trendelenburg.  +---------+---------------+---------+-----------+----------+-------------------+  RIGHT     Compressibility Phasicity Spontaneity Properties Thrombus Aging       +---------+---------------+---------+-----------+----------+-------------------+  CFV       Full                                             pulsatile            +---------+---------------+---------+-----------+----------+-------------------+  SFJ       Full                                                                  +---------+---------------+---------+-----------+----------+-------------------+  FV Prox   Full                                                                  +---------+---------------+---------+-----------+----------+-------------------+  FV  Mid    Full                                                                  +---------+---------------+---------+-----------+----------+-------------------+  FV Distal                                                  patent by color and                                                              Doppler               +---------+---------------+---------+-----------+----------+-------------------+  PFV       Full                                                                  +---------+---------------+---------+-----------+----------+-------------------+  POP       Full            No        No                                          +---------+---------------+---------+-----------+----------+-------------------+  PTV       Full                                                                  +---------+---------------+---------+-----------+----------+-------------------+  PERO      Full                                                                  +---------+---------------+---------+-----------+----------+-------------------+   +---------+---------------+---------+-----------+----------+--------------+  LEFT      Compressibility Phasicity Spontaneity Properties Thrombus Aging  +---------+---------------+---------+-----------+----------+--------------+  CFV       Full                                             pulsatile       +---------+---------------+---------+-----------+----------+--------------+  SFJ       Full                                                             +---------+---------------+---------+-----------+----------+--------------+  FV Prox   Full                                                             +---------+---------------+---------+-----------+----------+--------------+  FV Mid    Full                                                             +---------+---------------+---------+-----------+----------+--------------+  FV Distal Full                                                             +---------+---------------+---------+-----------+----------+--------------+  PFV       Full                                                             +---------+---------------+---------+-----------+----------+--------------+  POP       Full            No        No                                      +---------+---------------+---------+-----------+----------+--------------+  PTV       Full                                                             +---------+---------------+---------+-----------+----------+--------------+  PERO      Full                                                             +---------+---------------+---------+-----------+----------+--------------+     Summary: BILATERAL: - No evidence of deep vein thrombosis seen in the lower extremities, bilaterally. -No evidence of popliteal cyst, bilaterally.   *See table(s) above for measurements and observations. Electronically signed by Monica Martinez MD on 11/05/2021 at 2:35:15 PM.    Final      LOS: 3 days   Antonieta Pert, MD [facility]  11/06/2021, 12:45 PM

## 2021-11-07 ENCOUNTER — Inpatient Hospital Stay: Payer: BC Managed Care – PPO

## 2021-11-07 ENCOUNTER — Inpatient Hospital Stay: Payer: BC Managed Care – PPO | Admitting: Oncology

## 2021-11-07 ENCOUNTER — Inpatient Hospital Stay: Payer: BC Managed Care – PPO | Admitting: Hospice and Palliative Medicine

## 2021-11-07 DIAGNOSIS — C3490 Malignant neoplasm of unspecified part of unspecified bronchus or lung: Secondary | ICD-10-CM | POA: Diagnosis not present

## 2021-11-07 LAB — CBC WITH DIFFERENTIAL/PLATELET
Abs Immature Granulocytes: 0.07 10*3/uL (ref 0.00–0.07)
Basophils Absolute: 0 10*3/uL (ref 0.0–0.1)
Basophils Relative: 0 %
Eosinophils Absolute: 0 10*3/uL (ref 0.0–0.5)
Eosinophils Relative: 0 %
HCT: 34.6 % — ABNORMAL LOW (ref 36.0–46.0)
Hemoglobin: 11 g/dL — ABNORMAL LOW (ref 12.0–15.0)
Immature Granulocytes: 1 %
Lymphocytes Relative: 3 %
Lymphs Abs: 0.4 10*3/uL — ABNORMAL LOW (ref 0.7–4.0)
MCH: 29.8 pg (ref 26.0–34.0)
MCHC: 31.8 g/dL (ref 30.0–36.0)
MCV: 93.8 fL (ref 80.0–100.0)
Monocytes Absolute: 0.3 10*3/uL (ref 0.1–1.0)
Monocytes Relative: 2 %
Neutro Abs: 13.1 10*3/uL — ABNORMAL HIGH (ref 1.7–7.7)
Neutrophils Relative %: 94 %
Platelets: 251 10*3/uL (ref 150–400)
RBC: 3.69 MIL/uL — ABNORMAL LOW (ref 3.87–5.11)
RDW: 16.3 % — ABNORMAL HIGH (ref 11.5–15.5)
WBC: 13.8 10*3/uL — ABNORMAL HIGH (ref 4.0–10.5)
nRBC: 0 % (ref 0.0–0.2)

## 2021-11-07 LAB — GLUCOSE, CAPILLARY
Glucose-Capillary: 151 mg/dL — ABNORMAL HIGH (ref 70–99)
Glucose-Capillary: 162 mg/dL — ABNORMAL HIGH (ref 70–99)
Glucose-Capillary: 230 mg/dL — ABNORMAL HIGH (ref 70–99)
Glucose-Capillary: 177 mg/dL — ABNORMAL HIGH (ref 70–99)

## 2021-11-07 LAB — COMPREHENSIVE METABOLIC PANEL
ALT: 143 U/L — ABNORMAL HIGH (ref 0–44)
AST: 442 U/L — ABNORMAL HIGH (ref 15–41)
Albumin: 2.5 g/dL — ABNORMAL LOW (ref 3.5–5.0)
Alkaline Phosphatase: 425 U/L — ABNORMAL HIGH (ref 38–126)
Anion gap: 10 (ref 5–15)
BUN: 37 mg/dL — ABNORMAL HIGH (ref 8–23)
CO2: 28 mmol/L (ref 22–32)
Calcium: 6.9 mg/dL — ABNORMAL LOW (ref 8.9–10.3)
Chloride: 88 mmol/L — ABNORMAL LOW (ref 98–111)
Creatinine, Ser: 1.11 mg/dL — ABNORMAL HIGH (ref 0.44–1.00)
GFR, Estimated: 57 mL/min — ABNORMAL LOW (ref >60)
Glucose, Bld: 157 mg/dL — ABNORMAL HIGH (ref 70–99)
Potassium: 4.7 mmol/L (ref 3.5–5.1)
Sodium: 126 mmol/L — ABNORMAL LOW (ref 135–145)
Total Bilirubin: 3.3 mg/dL — ABNORMAL HIGH (ref 0.3–1.2)
Total Protein: 6.1 g/dL — ABNORMAL LOW (ref 6.5–8.1)

## 2021-11-07 LAB — URIC ACID: Uric Acid, Serum: 2.1 mg/dL — ABNORMAL LOW (ref 2.5–7.1)

## 2021-11-07 LAB — LACTATE DEHYDROGENASE: LDH: 1396 U/L — ABNORMAL HIGH (ref 98–192)

## 2021-11-07 MED ORDER — ALBUTEROL SULFATE (2.5 MG/3ML) 0.083% IN NEBU: 3.0000 mL | INHALATION_SOLUTION | Freq: Four times a day (QID) | RESPIRATORY_TRACT | Status: DC | PRN

## 2021-11-07 MED ORDER — SODIUM CHLORIDE 0.9 % IV SOLN
50.0000 mg/m2 | Freq: Once | INTRAVENOUS | Status: AC
  Administered 2021-11-07: 100 mg via INTRAVENOUS
  Filled 2021-11-07: qty 5

## 2021-11-07 MED ORDER — SODIUM CHLORIDE 0.9 % IV SOLN
10.0000 mg | Freq: Once | INTRAVENOUS | Status: AC
  Administered 2021-11-07: 10 mg via INTRAVENOUS
  Filled 2021-11-07: qty 1

## 2021-11-07 MED ORDER — CALCIUM GLUCONATE-NACL 2-0.675 GM/100ML-% IV SOLN
2.0000 g | Freq: Once | INTRAVENOUS | Status: AC
  Administered 2021-11-07: 2000 mg via INTRAVENOUS
  Filled 2021-11-07: qty 100

## 2021-11-07 MED ORDER — FUROSEMIDE 40 MG PO TABS
40.0000 mg | ORAL_TABLET | Freq: Once | ORAL | Status: AC
  Administered 2021-11-07: 40 mg via ORAL
  Filled 2021-11-07: qty 1

## 2021-11-07 MED FILL — Fentanyl Citrate Preservative Free (PF) Inj 100 MCG/2ML: INTRAMUSCULAR | Qty: 1 | Status: AC

## 2021-11-07 NOTE — Progress Notes (Signed)
So far, Pamela [name] has been doing well.  She has had 2 doses of chemotherapy for the first cycle of treatment.  She gets her last treatment today.  There are no labs that have been drawn yet.  We really need to have the lab work back.  Labs have been ordered daily.  She still gets full quite easily.  I suspect this is part from the hepatomegaly that is pressing on her stomach.  She has had no mouth sores.  She has had no real nausea or vomiting.  She has had no diarrhea.  The leg swelling might be a little bit better.  She does have some compression stockings on her feet.  She has had no bleeding.  There is been no headache.  Her vital signs are temperature 97.6.  Pulse 83.  Blood pressure 114/41.  Her oral exam shows no mucositis.  Lungs are clear bilaterally.  She has good air movement bilaterally.  Cardiac exam regular rate and rhythm.  Abdomen is soft.  She is somewhat obese.  She has no obvious fluid wave.  There is still some hepatomegaly below the right costal margin.  Extremities does show some mild edema in the legs bilaterally.  Skin exam shows no rashes.  Neurological exam is nonfocal.  Pamela [name] has extensive stage small cell lung cancer.  She had incredible liver involvement.  She was having impending hepatic failure when we started her treatment.  She was having renal insufficiency secondary to hyperuricemia.  This is resolved with rasburicase.  I think that we are going to have to be cautious with respect to her blood counts.  Again we does reduce the etoposide because of her hepatic insufficiency.  I suspect that she still will have issues with leukopenia/neutropenia.  She will get her third dose of etoposide today.  We will just have to follow her labs.  Again these are critical for [name].  I appreciate the incredible care she is getting from the wonderful staff on [address].  [name], MD  Psalms 147:3

## 2021-11-07 NOTE — Progress Notes (Addendum)
PROGRESS NOTE Pamela [name]  VZD:[id number] DOB: 12-30-1960 DOA: 11/03/2021 PCP: Marinda Elk, MD   Brief Narrative/Hospital Course: 61 year old female with diabetes hypertension hyperlipidemia hypothyroidism , chronic anemia with extensive stage small cell lung cancer diagnosed 2 weeks prior to admission presented with concern for tumor lysis syndrome AKI from oncology office referral.She had liver bx done 2/13 admitted for AKI/hyperuricemia tumor lysis risk. Seen by [facility] line placed for urgent chemo. She is maanged for elevated liver enzymes likely secondary to tumor burden.  Having ongoing pain go with fentanyl patch oxycodone.  Started on chemotherapy 2/20.  Patient had lower leg edema negative for DVT 11/04/21.  Oncology following started on chemotherapy     Subjective: Seen and examined.  Resting comfortably. No new complaints Labs pending this morning.  Overnight no fever blood pressure stable Labs later on came back with a stable renal function sodium slightly low 126 uric acid 2.1 AST ALT slightly worse  Assessment and Plan:  Extensive stage small cell lung cancer-diagnosed 2 weeks ago liver biopsy 2/13 Hyperuricemia concern/risk for tumor lysis syndrome:  MRI brain no intracranial mets but with 5 mm subdural hematoma/subdural hypodense lesion. Dr. Marin Olp on board and getting chemotherapy last dose will be given today.  Monitor LFTs uric acid, ldh, renl fun and watch for TLS.  Elevated liver enzymes: Likely in the setting of substantial tumor burden in the liver. Trend LFTs.  Recent Labs  Lab 11/03/21 1105 11/04/21 0245 11/05/21 0537 11/06/21 0525 11/07/21 0832  AST 389* 362* 388* 369* 442*  ALT 141* 134* 144* 142* 143*  ALKPHOS 440* 419* 457* 440* 425*  BILITOT 3.5* 3.2* 4.0* 3.6* 3.3*  PROT 6.5 6.5 6.3* 6.2* 6.1*  ALBUMIN 2.7* 2.8* 2.7* 2.6* 2.5*    C4 hypodense lesion Mid back pain L Post SDH vs Cancer mets: Seen in MRI.See #1 continue pain control  supportive care.  Patient does not wish to go to through another MRI.  AKI W/ metabolic acidosis: AKI resolved.cont po hydration.  Bicarb improved.  B/L LE edema: Dopplers negative for DVT, BNP borderline 169 patient reports its improving continue compression stocking and elevate the leg. Can try dose of lasix 40 mg  i1 today.  Hyponatremia: Suspect some component of dehydration as well as question SIADH due to small cell lung cancer.  Monitor sodium as below at 126 today.  Try Lasix 40 mg x 1 today Recent Labs  Lab 11/03/21 1725 11/04/21 0245 11/05/21 0537 11/06/21 0525 11/07/21 0832  NA 125* 127* 127* 127* 126*    Acquired hypothyroidism: on Synthroid  Chronic anemia:stable, monitor Recent Labs  Lab 11/03/21 1105 11/04/21 0245 11/05/21 0537 11/06/21 0525 11/07/21 0832  HGB 11.8* 11.0* 11.0* 11.1* 11.0*  HCT 36.2 33.8* 34.7* 34.6* 34.6*    Leukocytosis likely reactive.  Afebrile, monitor as below. Recent Labs  Lab 11/03/21 1105 11/04/21 0245 11/05/21 0537 11/06/21 0525 11/07/21 0832  WBC 13.3* 12.8* 15.6* 11.2* 13.8*    Essential hypertension: stable, not on meds  T2DM: Well-controlled blood sugar holding metformin, cont ssi Recent Labs  Lab 11/06/21 1212 11/06/21 1736 11/06/21 2035 11/07/21 0808 11/07/21 1143  GLUCAP 212* 173* 180* 151* 162*    Class II Obesity:Patient's Body mass index is 39.52 kg/m. : Will benefit with PCP follow-up, weight loss  healthy lifestyle and outpatient sleep evaluation.  DVT prophylaxis: Place TED hose Start: 11/05/21 0711 SCDs Start: 11/03/21 1431 Place TED hose Start: 11/03/21 1431 Code Status:   Code Status: Full Code Family Communication:  plan of care discussed with patient/family at bedside.  Disposition: Currently not medically stable for discharge. Status is: Inpatient Remains inpatient appropriate because: Ongoing management of cancer and chemo  Objective: Vitals last 24 hrs: Vitals:   11/06/21 2036 11/07/21  0525 11/07/21 1103 11/07/21 1106  BP: (!) 104/51 (!) 114/41    Pulse: 80 83    Resp: 16 16    Temp: 97.7 F (36.5 C) 97.6 F (36.4 C)    TempSrc: Oral Oral    SpO2: 95% 95% (!) 88% (!) 88%  Weight:       Weight change:   Physical Examination: General exam: AA0x3, older than stated age, weak appearing. HEENT:Oral mucosa moist, Ear/Nose WNL grossly, dentition normal. Respiratory system: bilaterally diminished, no use of accessory muscle Cardiovascular system: S1 & S2 +, No JVD,. Gastrointestinal system: Abdomen soft,NT,ND,BS+ Nervous System:Alert, awake, moving extremities and grossly nonfocal Extremities: LE ankle edema non pitting edema,distal peripheral pulses palpable.  Skin: No rashes,no icterus. MSK: Normal muscle bulk,tone, power   Medications reviewed: Scheduled Meds:  allopurinol  300 mg Oral Daily   antiseptic oral rinse  15 mL Mouth Rinse Q6H   Chlorhexidine Gluconate Cloth  6 each Topical Daily   etoposide  50 mg/m2 (Treatment Plan Recorded) Intravenous Once   feeding supplement  237 mL Oral BID BM   fentaNYL  1 patch Transdermal Q72H   insulin aspart  0-9 Units Subcutaneous TID WC   levothyroxine  50 mcg Oral QAC breakfast   multivitamin with minerals  1 tablet Oral Daily   pantoprazole  40 mg Oral BID   polyethylene glycol  17 g Oral Daily   senna-docusate  1 tablet Oral BID   Continuous Infusions:  sodium bicarbonate in 0.45 NS mL infusion 100 mL/hr at 11/07/21 0540      Diet Order             Diet Carb Modified Fluid consistency: Thin; Room service appropriate? Yes  Diet effective now                    Nutrition Problem: Increased nutrient needs Etiology: cancer and cancer related treatments Signs/Symptoms: estimated needs Interventions: Ensure Enlive (each supplement provides 350kcal and 20 grams of protein), MVI   Intake/Output Summary (Last 24 hours) at 11/07/2021 1221 Last data filed at 11/07/2021 0814 Gross per 24 hour  Intake 6173.35  ml  Output --  Net 6173.35 ml   Net IO Since Admission: 10,859.53 mL [11/07/21 1221]  Wt Readings from Last 3 Encounters:  11/04/21 98 kg  11/02/21 92.5 kg  10/29/21 91.2 kg     Unresulted Labs (From admission, onward)     Start     Ordered   11/08/21 0500  Comprehensive metabolic panel  Daily,   R      11/07/21 0715   11/08/21 0500  Uric acid  Daily,   R      11/07/21 0715   11/07/21 0716  Lactate dehydrogenase  Daily,   R      11/07/21 0715   11/04/21 0500  CBC with Differential/Platelet  Daily,   R      11/03/21 1238          Data Reviewed: I have personally reviewed following labs and imaging studies CBC: Recent Labs  Lab 11/03/21 1105 11/04/21 0245 11/05/21 0537 11/06/21 0525 11/07/21 0832  WBC 13.3* 12.8* 15.6* 11.2* 13.8*  NEUTROABS 12.0* 10.7* 14.4* 10.1* 13.1*  HGB 11.8* 11.0* 11.0* 11.1* 11.0*  HCT 36.2 33.8* 34.7* 34.6* 34.6*  MCV 91.0 91.4 91.8 92.0 93.8  PLT 413* 367 330 266 818   Basic Metabolic Panel: Recent Labs  Lab 11/02/21 1243 11/03/21 1105 11/03/21 1725 11/04/21 0245 11/05/21 0537 11/06/21 0525 11/07/21 0832  NA 126* 126* 125* 127* 127* 127* 126*  K 4.9 4.9 5.0 4.5 4.5 4.5 4.7  CL 88* 89* 91* 90* 88* 89* 88*  CO2 22 19* 20* 22 23 27 28   GLUCOSE 125* 121* 113* 128* 134* 137* 157*  BUN 60* 62* 58* 57* 49* 40* 37*  CREATININE 2.31* 2.34* 2.08* 1.64* 1.32* 1.09* 1.11*  CALCIUM 9.3 9.4 9.0 8.9 8.6* 7.8* 6.9*  PHOS 3.9 5.0*  --   --   --   --   --    GFR: Estimated Creatinine Clearance: 58.2 mL/min (A) (by C-G formula based on SCr of 1.11 mg/dL (H)). Liver Function Tests: Recent Labs  Lab 11/03/21 1105 11/04/21 0245 11/05/21 0537 11/06/21 0525 11/07/21 0832  AST 389* 362* 388* 369* 442*  ALT 141* 134* 144* 142* 143*  ALKPHOS 440* 419* 457* 440* 425*  BILITOT 3.5* 3.2* 4.0* 3.6* 3.3*  PROT 6.5 6.5 6.3* 6.2* 6.1*  ALBUMIN 2.7* 2.8* 2.7* 2.6* 2.5*   No results for input(s): LIPASE, AMYLASE in the last 168 hours. No results  for input(s): AMMONIA in the last 168 hours. Coagulation Profile: No results for input(s): INR, PROTIME in the last 168 hours. Cardiac Enzymes: No results for input(s): CKTOTAL, CKMB, CKMBINDEX, TROPONINI in the last 168 hours. BNP (last 3 results) No results for input(s): PROBNP in the last 8760 hours. HbA1C: No results for input(s): HGBA1C in the last 72 hours. CBG: Recent Labs  Lab 11/06/21 1212 11/06/21 1736 11/06/21 2035 11/07/21 0808 11/07/21 1143  GLUCAP 212* 173* 180* 151* 162*   Lipid Profile: No results for input(s): CHOL, HDL, LDLCALC, TRIG, CHOLHDL, LDLDIRECT in the last 72 hours. Thyroid Function Tests: No results for input(s): TSH, T4TOTAL, FREET4, T3FREE, THYROIDAB in the last 72 hours.  Anemia Panel: No results for input(s): VITAMINB12, FOLATE, FERRITIN, TIBC, IRON, RETICCTPCT in the last 72 hours. Sepsis Labs: No results for input(s): PROCALCITON, LATICACIDVEN in the last 168 hours.  Recent Results (from the past 240 hour(s))  Resp Panel by RT-PCR (Flu A&B, Covid) Nasopharyngeal Swab     Status: None   Collection Time: 11/03/21 11:50 AM   Specimen: Nasopharyngeal Swab; Nasopharyngeal(NP) swabs in vial transport medium  Result Value Ref Range Status   SARS Coronavirus 2 by RT PCR NEGATIVE NEGATIVE Final    Comment: (NOTE) SARS-CoV-2 target nucleic acids are NOT DETECTED.  The SARS-CoV-2 RNA is generally detectable in upper respiratory specimens during the acute phase of infection. The lowest concentration of SARS-CoV-2 viral copies this assay can detect is 138 copies/mL. A negative result does not preclude SARS-Cov-2 infection and should not be used as the sole basis for treatment or other patient management decisions. A negative result may occur with  improper specimen collection/handling, submission of specimen other than nasopharyngeal swab, presence of viral mutation(s) within the areas targeted by this assay, and inadequate number of  viral copies(<138 copies/mL). A negative result must be combined with clinical observations, patient history, and epidemiological information. The expected result is Negative.  Fact Sheet for Patients:  EntrepreneurPulse.com.au  Fact Sheet for Healthcare Providers:  IncredibleEmployment.be  This test is no t yet approved or cleared by the [place] FDA and  has been authorized for detection and/or diagnosis of SARS-CoV-2  by FDA under an Emergency Use Authorization (EUA). This EUA will remain  in effect (meaning this test can be used) for the duration of the COVID-19 declaration under Section 564(b)(1) of the Act, 21 U.S.C.section 360bbb-3(b)(1), unless the authorization is terminated  or revoked sooner.       Influenza A by PCR NEGATIVE NEGATIVE Final   Influenza B by PCR NEGATIVE NEGATIVE Final    Comment: (NOTE) The Xpert Xpress SARS-CoV-2/FLU/RSV plus assay is intended as an aid in the diagnosis of influenza from Nasopharyngeal swab specimens and should not be used as a sole basis for treatment. Nasal washings and aspirates are unacceptable for Xpert Xpress SARS-CoV-2/FLU/RSV testing.  Fact Sheet for Patients: EntrepreneurPulse.com.au  Fact Sheet for Healthcare Providers: IncredibleEmployment.be  This test is not yet approved or cleared by the [place] FDA and has been authorized for detection and/or diagnosis of SARS-CoV-2 by FDA under an Emergency Use Authorization (EUA). This EUA will remain in effect (meaning this test can be used) for the duration of the COVID-19 declaration under Section 564(b)(1) of the Act, 21 U.S.C. section 360bbb-3(b)(1), unless the authorization is terminated or revoked.  Performed at [facility], [place] [address] [place], [place] [address]    Antimicrobials: Anti-infectives (From admission, onward)    None     Culture/Microbiology No results found  for: SDES, SPECREQUEST, CULT, REPTSTATUS  Other culture-see note   Radiology Studies: No results found.   LOS: 4 days   Antonieta Pert, MD [facility]  11/07/2021, 12:21 PM

## 2021-11-08 ENCOUNTER — Inpatient Hospital Stay: Payer: BC Managed Care – PPO

## 2021-11-08 DIAGNOSIS — C3490 Malignant neoplasm of unspecified part of unspecified bronchus or lung: Secondary | ICD-10-CM | POA: Diagnosis not present

## 2021-11-08 LAB — CBC WITH DIFFERENTIAL/PLATELET
Abs Immature Granulocytes: 0.09 10*3/uL — ABNORMAL HIGH (ref 0.00–0.07)
Basophils Absolute: 0 10*3/uL (ref 0.0–0.1)
Basophils Relative: 0 %
Eosinophils Absolute: 0 10*3/uL (ref 0.0–0.5)
Eosinophils Relative: 0 %
HCT: 34.1 % — ABNORMAL LOW (ref 36.0–46.0)
Hemoglobin: 10.9 g/dL — ABNORMAL LOW (ref 12.0–15.0)
Immature Granulocytes: 1 %
Lymphocytes Relative: 3 %
Lymphs Abs: 0.4 10*3/uL — ABNORMAL LOW (ref 0.7–4.0)
MCH: 29.6 pg (ref 26.0–34.0)
MCHC: 32 g/dL (ref 30.0–36.0)
MCV: 92.7 fL (ref 80.0–100.0)
Monocytes Absolute: 0.1 10*3/uL (ref 0.1–1.0)
Monocytes Relative: 1 %
Neutro Abs: 10.5 10*3/uL — ABNORMAL HIGH (ref 1.7–7.7)
Neutrophils Relative %: 95 %
Platelets: 235 10*3/uL (ref 150–400)
RBC: 3.68 MIL/uL — ABNORMAL LOW (ref 3.87–5.11)
RDW: 16 % — ABNORMAL HIGH (ref 11.5–15.5)
WBC: 11 10*3/uL — ABNORMAL HIGH (ref 4.0–10.5)
nRBC: 0 % (ref 0.0–0.2)

## 2021-11-08 LAB — COMPREHENSIVE METABOLIC PANEL
ALT: 149 U/L — ABNORMAL HIGH (ref 0–44)
AST: 599 U/L — ABNORMAL HIGH (ref 15–41)
Albumin: 2.5 g/dL — ABNORMAL LOW (ref 3.5–5.0)
Alkaline Phosphatase: 445 U/L — ABNORMAL HIGH (ref 38–126)
Anion gap: 10 (ref 5–15)
BUN: 36 mg/dL — ABNORMAL HIGH (ref 8–23)
CO2: 27 mmol/L (ref 22–32)
Calcium: 6.5 mg/dL — ABNORMAL LOW (ref 8.9–10.3)
Chloride: 88 mmol/L — ABNORMAL LOW (ref 98–111)
Creatinine, Ser: 1.05 mg/dL — ABNORMAL HIGH (ref 0.44–1.00)
GFR, Estimated: >60 mL/min (ref >60)
Glucose, Bld: 129 mg/dL — ABNORMAL HIGH (ref 70–99)
Potassium: 4.8 mmol/L (ref 3.5–5.1)
Sodium: 125 mmol/L — ABNORMAL LOW (ref 135–145)
Total Bilirubin: 2.7 mg/dL — ABNORMAL HIGH (ref 0.3–1.2)
Total Protein: 6.2 g/dL — ABNORMAL LOW (ref 6.5–8.1)

## 2021-11-08 LAB — URIC ACID: Uric Acid, Serum: 2.5 mg/dL (ref 2.5–7.1)

## 2021-11-08 LAB — GLUCOSE, CAPILLARY
Glucose-Capillary: 106 mg/dL — ABNORMAL HIGH (ref 70–99)
Glucose-Capillary: 180 mg/dL — ABNORMAL HIGH (ref 70–99)
Glucose-Capillary: 168 mg/dL — ABNORMAL HIGH (ref 70–99)

## 2021-11-08 LAB — LACTATE DEHYDROGENASE: LDH: 2037 U/L — ABNORMAL HIGH (ref 98–192)

## 2021-11-08 MED ORDER — CALCIUM GLUCONATE-NACL 2-0.675 GM/100ML-% IV SOLN
2.0000 g | Freq: Once | INTRAVENOUS | Status: AC
  Administered 2021-11-08: 2000 mg via INTRAVENOUS
  Filled 2021-11-08: qty 100

## 2021-11-08 MED ORDER — ACETAMINOPHEN 325 MG PO TABS
650.0000 mg | ORAL_TABLET | Freq: Four times a day (QID) | ORAL | Status: DC | PRN
  Administered 2021-11-08: 650 mg via ORAL
  Filled 2021-11-08: qty 2

## 2021-11-08 MED ORDER — ENOXAPARIN SODIUM 40 MG/0.4ML IJ SOSY
40.0000 mg | PREFILLED_SYRINGE | INTRAMUSCULAR | Status: DC
  Administered 2021-11-08 – 2021-11-15 (×8): 40 mg via SUBCUTANEOUS
  Filled 2021-11-08 (×8): qty 0.4

## 2021-11-08 MED ORDER — FUROSEMIDE 10 MG/ML IJ SOLN
40.0000 mg | Freq: Once | INTRAMUSCULAR | Status: AC
  Administered 2021-11-08: 40 mg via INTRAVENOUS
  Filled 2021-11-08: qty 4

## 2021-11-08 NOTE — Progress Notes (Addendum)
PROGRESS NOTE Pamela [name]  YSA:[id number] DOB: 1960-10-10 DOA: 11/03/2021 PCP: Marinda Elk, MD   Brief Narrative/Hospital Course: 61 year old female with diabetes hypertension hyperlipidemia hypothyroidism , chronic anemia with extensive stage small cell lung cancer diagnosed 2 weeks prior to admission presented with concern for tumor lysis syndrome AKI from oncology office referral.She had liver bx done 2/13 admitted for AKI/hyperuricemia tumor lysis risk. Seen by [facility] line placed for urgent chemo. She is maanged for elevated liver enzymes likely secondary to tumor burden.  Having ongoing pain go with fentanyl patch oxycodone.  Started on chemotherapy 2/20.  Patient had lower leg edema negative for DVT 11/04/21.  Oncology following started on chemotherapy     Subjective: Seen and examined this morning. Patient reports she feels less bloated less swollen after Lasix dose yesterday Overnight afebrile no fever  Assessment and Plan:  Extensive stage small cell lung cancer-diagnosed 2 weeks ago liver biopsy 2/13 Hyperuricemia concern/risk for tumor lysis syndrome:  MRI brain no intracranial mets but with 5 mm subdural hematoma/subdural hypodense lesion. Dr. Marin Olp on board and completed first cycle of chemotherapy, has had no problems with nausea or vomiting, at risk of tumor lysis syndrome monitor LDH uric acid closely along with BMP.  Uric acid remains normal LDH trending up renal function is stable.  Elevated liver enzymes: Likely in the setting of substantial tumor burden in the liver.  Remains elevated, trend LFTs.  Recent Labs  Lab 11/04/21 0245 11/05/21 0537 11/06/21 0525 11/07/21 0832 11/08/21 0530  AST 362* 388* 369* 442* 599*  ALT 134* 144* 142* 143* 149*  ALKPHOS 419* 457* 440* 425* 445*  BILITOT 3.2* 4.0* 3.6* 3.3* 2.7*  PROT 6.5 6.3* 6.2* 6.1* 6.2*  ALBUMIN 2.8* 2.7* 2.6* 2.5* 2.5*    C4 hypodense lesion Mid back pain L Post SDH vs Cancer mets: Seen in  MRI.See #1 continue pain control supportive care.  Pain management. patient does not wish to go to through another MRI.  AKI W/ metabolic acidosis: AKI resolved.cont po hydration.  Bicarb improved.  Monitor labs  Hypocalcemia replete with IV calcium gluconate.  Monitor  B/L LE edema/hypervolemia/anasarca: Dopplers negative for DVT, BNP borderline 169.  Suspecting multifactorial with patient's hypoalbuminemia, fluid overload.  We will discontinue IV fluids . Her weight has gone up significantly 216> 235 lb and on recent weight check PTA was in low 200s.   We will give Lasix 40 mg IV x 1 and reassess.  We will let oncology know as well.  Significantly positive net balance as below.Net IO Since Admission: 13,783.48 mL [11/08/21 1052]  Filed Weights   11/04/21 0008 11/08/21 0913  Weight: 98 kg 106.6 kg    Hyponatremia: Suspect some component of dehydration as well as question SIADH due to small cell lung cancer.  Monitor sodium as below at 126 today.  S/p Lasix 40 mg x 1  2/11.  Sodium remains low, given hypervolemia anticipating Lasix will improve Recent Labs  Lab 11/04/21 0245 11/05/21 0537 11/06/21 0525 11/07/21 0832 11/08/21 0530  NA 127* 127* 127* 126* 125*    Acquired hypothyroidism: Continue Synthroid  Chronic anemia:stable, monitor hemoglobin Recent Labs  Lab 11/04/21 0245 11/05/21 0537 11/06/21 0525 11/07/21 0832 11/08/21 0530  HGB 11.0* 11.0* 11.1* 11.0* 10.9*  HCT 33.8* 34.7* 34.6* 34.6* 34.1*    Leukocytosis likely reactive.  Afebrile, monitor as below. Recent Labs  Lab 11/04/21 0245 11/05/21 0537 11/06/21 0525 11/07/21 0832 11/08/21 0530  WBC 12.8* 15.6* 11.2* 13.8* 11.0*  Essential hypertension: BP stable, not on meds  T2DM: Well-controlled blood sugar holding metformin, monitor blood glucose and sliding scale  Recent Labs  Lab 11/07/21 0808 11/07/21 1143 11/07/21 1641 11/07/21 2130 11/08/21 0728  GLUCAP 151* 162* 230* 177* 106*    Class II  Obesity:Patient's Body mass index is 42.98 kg/m. : Will benefit with PCP follow-up, weight loss  healthy lifestyle and outpatient sleep evaluation.  DVT prophylaxis: enoxaparin (LOVENOX) injection 40 mg Start: 11/08/21 1000 Place TED hose Start: 11/05/21 0711 SCDs Start: 11/03/21 1431 Place TED hose Start: 11/03/21 1431 Code Status:   Code Status: Full Code Family Communication: plan of care discussed with patient/family at bedside.  Disposition: Currently not medically stable for discharge. Status is: Inpatient Remains inpatient appropriate because: Ongoing management of cancer and chemo  Objective: Vitals last 24 hrs: Vitals:   11/07/21 1350 11/07/21 2130 11/08/21 0543 11/08/21 0913  BP: (!) 114/53 (!) 110/45 (!) 112/47   Pulse: 83 78 72   Resp: 18 17 14    Temp:  97.9 F (36.6 C) (!) 97.5 F (36.4 C)   TempSrc:  Oral Oral   SpO2: (!) 82% 93% 98%   Weight:    106.6 kg   Weight change:   Physical Examination: General exam: AAOX3, older than stated age, weak appearing. HEENT:Oral mucosa moist, Ear/Nose WNL grossly, dentition normal. Respiratory system: bilaterally diminished, no use of accessory muscle Cardiovascular system: S1 & S2 +, No JVD,. Gastrointestinal system: Abdomen soft,NT, distended,BS+ Nervous System:Alert, awake, moving extremities and grossly nonfocal Extremities: LE ankle edema 2++, distal peripheral pulses palpable.  Skin: No rashes,no icterus. MSK: Normal muscle bulk,tone, power   Medications reviewed: Scheduled Meds:  allopurinol  300 mg Oral Daily   antiseptic oral rinse  15 mL Mouth Rinse Q6H   Chlorhexidine Gluconate Cloth  6 each Topical Daily   enoxaparin (LOVENOX) injection  40 mg Subcutaneous Q24H   feeding supplement  237 mL Oral BID BM   fentaNYL  1 patch Transdermal Q72H   insulin aspart  0-9 Units Subcutaneous TID WC   levothyroxine  50 mcg Oral QAC breakfast   multivitamin with minerals  1 tablet Oral Daily   pantoprazole  40 mg Oral  BID   polyethylene glycol  17 g Oral Daily   senna-docusate  1 tablet Oral BID   Continuous Infusions:  calcium gluconate 2,000 mg (11/08/21 1014)   sodium bicarbonate in 0.45 NS mL infusion 50 mL/hr at 11/08/21 5643      Diet Order             Diet Carb Modified Fluid consistency: Thin; Room service appropriate? Yes  Diet effective now                    Nutrition Problem: Increased nutrient needs Etiology: cancer and cancer related treatments Signs/Symptoms: estimated needs Interventions: Ensure Enlive (each supplement provides 350kcal and 20 grams of protein), MVI   Intake/Output Summary (Last 24 hours) at 11/08/2021 1051 Last data filed at 11/08/2021 0900 Gross per 24 hour  Intake 2923.95 ml  Output --  Net 2923.95 ml   Net IO Since Admission: 13,783.48 mL [11/08/21 1051]  Wt Readings from Last 3 Encounters:  11/08/21 106.6 kg  11/02/21 92.5 kg  10/29/21 91.2 kg     Unresulted Labs (From admission, onward)     Start     Ordered   11/08/21 0500  Comprehensive metabolic panel  Daily,   R      11/07/21  0715   11/08/21 0500  Uric acid  Daily,   R      11/07/21 0715   11/07/21 0716  Lactate dehydrogenase  Daily,   R      11/07/21 0715   11/04/21 0500  CBC with Differential/Platelet  Daily,   R      11/03/21 1238          Data Reviewed: I have personally reviewed following labs and imaging studies CBC: Recent Labs  Lab 11/04/21 0245 11/05/21 0537 11/06/21 0525 11/07/21 0832 11/08/21 0530  WBC 12.8* 15.6* 11.2* 13.8* 11.0*  NEUTROABS 10.7* 14.4* 10.1* 13.1* 10.5*  HGB 11.0* 11.0* 11.1* 11.0* 10.9*  HCT 33.8* 34.7* 34.6* 34.6* 34.1*  MCV 91.4 91.8 92.0 93.8 92.7  PLT 367 330 266 251 314   Basic Metabolic Panel: Recent Labs  Lab 11/02/21 1243 11/03/21 1105 11/03/21 1725 11/04/21 0245 11/05/21 0537 11/06/21 0525 11/07/21 0832 11/08/21 0530  NA 126* 126*   < > 127* 127* 127* 126* 125*  K 4.9 4.9   < > 4.5 4.5 4.5 4.7 4.8  CL 88* 89*   < >  90* 88* 89* 88* 88*  CO2 22 19*   < > 22 23 27 28 27   GLUCOSE 125* 121*   < > 128* 134* 137* 157* 129*  BUN 60* 62*   < > 57* 49* 40* 37* 36*  CREATININE 2.31* 2.34*   < > 1.64* 1.32* 1.09* 1.11* 1.05*  CALCIUM 9.3 9.4   < > 8.9 8.6* 7.8* 6.9* 6.5*  PHOS 3.9 5.0*  --   --   --   --   --   --    < > = values in this interval not displayed.   GFR: Estimated Creatinine Clearance: 64.6 mL/min (A) (by C-G formula based on SCr of 1.05 mg/dL (H)). Liver Function Tests: Recent Labs  Lab 11/04/21 0245 11/05/21 0537 11/06/21 0525 11/07/21 0832 11/08/21 0530  AST 362* 388* 369* 442* 599*  ALT 134* 144* 142* 143* 149*  ALKPHOS 419* 457* 440* 425* 445*  BILITOT 3.2* 4.0* 3.6* 3.3* 2.7*  PROT 6.5 6.3* 6.2* 6.1* 6.2*  ALBUMIN 2.8* 2.7* 2.6* 2.5* 2.5*   No results for input(s): LIPASE, AMYLASE in the last 168 hours. No results for input(s): AMMONIA in the last 168 hours. Coagulation Profile: No results for input(s): INR, PROTIME in the last 168 hours. Cardiac Enzymes: No results for input(s): CKTOTAL, CKMB, CKMBINDEX, TROPONINI in the last 168 hours. BNP (last 3 results) No results for input(s): PROBNP in the last 8760 hours. HbA1C: No results for input(s): HGBA1C in the last 72 hours. CBG: Recent Labs  Lab 11/07/21 0808 11/07/21 1143 11/07/21 1641 11/07/21 2130 11/08/21 0728  GLUCAP 151* 162* 230* 177* 106*   Lipid Profile: No results for input(s): CHOL, HDL, LDLCALC, TRIG, CHOLHDL, LDLDIRECT in the last 72 hours. Thyroid Function Tests: No results for input(s): TSH, T4TOTAL, FREET4, T3FREE, THYROIDAB in the last 72 hours.  Anemia Panel: No results for input(s): VITAMINB12, FOLATE, FERRITIN, TIBC, IRON, RETICCTPCT in the last 72 hours. Sepsis Labs: No results for input(s): PROCALCITON, LATICACIDVEN in the last 168 hours.  Recent Results (from the past 240 hour(s))  Resp Panel by RT-PCR (Flu A&B, Covid) Nasopharyngeal Swab     Status: None   Collection Time: 11/03/21 11:50  AM   Specimen: Nasopharyngeal Swab; Nasopharyngeal(NP) swabs in vial transport medium  Result Value Ref Range Status   SARS Coronavirus 2 by RT PCR NEGATIVE  NEGATIVE Final    Comment: (NOTE) SARS-CoV-2 target nucleic acids are NOT DETECTED.  The SARS-CoV-2 RNA is generally detectable in upper respiratory specimens during the acute phase of infection. The lowest concentration of SARS-CoV-2 viral copies this assay can detect is 138 copies/mL. A negative result does not preclude SARS-Cov-2 infection and should not be used as the sole basis for treatment or other patient management decisions. A negative result may occur with  improper specimen collection/handling, submission of specimen other than nasopharyngeal swab, presence of viral mutation(s) within the areas targeted by this assay, and inadequate number of viral copies(<138 copies/mL). A negative result must be combined with clinical observations, patient history, and epidemiological information. The expected result is Negative.  Fact Sheet for Patients:  EntrepreneurPulse.com.au  Fact Sheet for Healthcare Providers:  IncredibleEmployment.be  This test is no t yet approved or cleared by the [place] FDA and  has been authorized for detection and/or diagnosis of SARS-CoV-2 by FDA under an Emergency Use Authorization (EUA). This EUA will remain  in effect (meaning this test can be used) for the duration of the COVID-19 declaration under Section 564(b)(1) of the Act, 21 U.S.C.section 360bbb-3(b)(1), unless the authorization is terminated  or revoked sooner.       Influenza A by PCR NEGATIVE NEGATIVE Final   Influenza B by PCR NEGATIVE NEGATIVE Final    Comment: (NOTE) The Xpert Xpress SARS-CoV-2/FLU/RSV plus assay is intended as an aid in the diagnosis of influenza from Nasopharyngeal swab specimens and should not be used as a sole basis for treatment. Nasal washings and aspirates are  unacceptable for Xpert Xpress SARS-CoV-2/FLU/RSV testing.  Fact Sheet for Patients: EntrepreneurPulse.com.au  Fact Sheet for Healthcare Providers: IncredibleEmployment.be  This test is not yet approved or cleared by the [place] FDA and has been authorized for detection and/or diagnosis of SARS-CoV-2 by FDA under an Emergency Use Authorization (EUA). This EUA will remain in effect (meaning this test can be used) for the duration of the COVID-19 declaration under Section 564(b)(1) of the Act, 21 U.S.C. section 360bbb-3(b)(1), unless the authorization is terminated or revoked.  Performed at [facility], [place] [address], [place], [place] [address]    Antimicrobials: Anti-infectives (From admission, onward)    None     Culture/Microbiology No results found for: SDES, SPECREQUEST, CULT, REPTSTATUS  Other culture-see note   Radiology Studies: No results found.   LOS: 5 days   Antonieta Pert, MD [facility]  11/08/2021, 10:51 AM

## 2021-11-08 NOTE — Progress Notes (Addendum)
Pamela [name] is doing well.  She completed her first cycle of chemotherapy.  She has had no problems with nausea or vomiting.  She says she is not as bloated.  She feels like she can eat a little bit more.  There is no mouth sores.  She has had no diarrhea.  She has had no bleeding.  She still has a bit of swelling in the legs.  I am sure this is from her hepatomegaly secondary to the underlying malignancy.  Her labs today show white cell count 11,000.  Hemoglobin 10.9.  Platelet count 235,000.  Her metabolic panel and uric acid not yet back.  Yesterday, his uric acid was 2.1.  I know this is still low but yet trending upward.  Her LDH was quite high.  I still think she is at risk for tumor lysis.  We will going to have to monitor the uric acid closely.  Her renal function seem to be doing okay yesterday.  She has had no problems with fever.  Her vital signs show temperature of 97.5.  Pulse 72.  Blood pressure 112/47.  Her lungs sound clear bilaterally.  I hear no wheezing.  Cardiac exam regular rate and rhythm.  Her abdomen is soft but obese.  She has decent bowel sounds.  There is no obvious fluid wave.  There is still palpable hepatomegaly below the right costal margin.  Oral exam shows no mucositis.  Neurological exam is nonfocal.  Again, I suspect that she will develop an element of tumor lysis.  I think we have to watch her for a couple more days.  We will have to see what the uric acid trends.  I forgot to mention that we did give her some supplemental calcium yesterday.  I do appreciate all the incredible care that she is getting from the staff up on 6 E.  [name], MD  Psalms 6:2   ADDENDUM: Her electrolytes did come back.  I do suspect that there is a degree of tumor lysis now.  Her LDH keeps going up.  It is now 2200.  The alkaline phosphatase is up to 599.  Her calcium is even lower at 6.5 despite giving IV calcium yesterday.  We will have to give her IV calcium again  today.  Her uric acid is 2.5.  Again this is trending up.  Thankfully, her renal function still looks good.  There is no way that she can go home until we can be safe in knowing that the tumor lysis is resolving.  [name], MD

## 2021-11-09 ENCOUNTER — Ambulatory Visit: Payer: BC Managed Care – PPO

## 2021-11-09 DIAGNOSIS — C3490 Malignant neoplasm of unspecified part of unspecified bronchus or lung: Secondary | ICD-10-CM | POA: Diagnosis not present

## 2021-11-09 LAB — CBC WITH DIFFERENTIAL/PLATELET
Abs Immature Granulocytes: 0.07 10*3/uL (ref 0.00–0.07)
Basophils Absolute: 0 10*3/uL (ref 0.0–0.1)
Basophils Relative: 0 %
Eosinophils Absolute: 0 10*3/uL (ref 0.0–0.5)
Eosinophils Relative: 0 %
HCT: 32.7 % — ABNORMAL LOW (ref 36.0–46.0)
Hemoglobin: 10.7 g/dL — ABNORMAL LOW (ref 12.0–15.0)
Immature Granulocytes: 1 %
Lymphocytes Relative: 4 %
Lymphs Abs: 0.4 10*3/uL — ABNORMAL LOW (ref 0.7–4.0)
MCH: 30 pg (ref 26.0–34.0)
MCHC: 32.7 g/dL (ref 30.0–36.0)
MCV: 91.6 fL (ref 80.0–100.0)
Monocytes Absolute: 0 10*3/uL — ABNORMAL LOW (ref 0.1–1.0)
Monocytes Relative: 0 %
Neutro Abs: 10.2 10*3/uL — ABNORMAL HIGH (ref 1.7–7.7)
Neutrophils Relative %: 95 %
Platelets: 215 10*3/uL (ref 150–400)
RBC: 3.57 MIL/uL — ABNORMAL LOW (ref 3.87–5.11)
RDW: 15.9 % — ABNORMAL HIGH (ref 11.5–15.5)
WBC: 10.7 10*3/uL — ABNORMAL HIGH (ref 4.0–10.5)
nRBC: 0 % (ref 0.0–0.2)

## 2021-11-09 LAB — LACTATE DEHYDROGENASE: LDH: 2624 U/L — ABNORMAL HIGH (ref 98–192)

## 2021-11-09 LAB — GLUCOSE, CAPILLARY
Glucose-Capillary: 172 mg/dL — ABNORMAL HIGH (ref 70–99)
Glucose-Capillary: 146 mg/dL — ABNORMAL HIGH (ref 70–99)
Glucose-Capillary: 145 mg/dL — ABNORMAL HIGH (ref 70–99)
Glucose-Capillary: 166 mg/dL — ABNORMAL HIGH (ref 70–99)
Glucose-Capillary: 131 mg/dL — ABNORMAL HIGH (ref 70–99)

## 2021-11-09 LAB — COMPREHENSIVE METABOLIC PANEL
ALT: 141 U/L — ABNORMAL HIGH (ref 0–44)
AST: 754 U/L — ABNORMAL HIGH (ref 15–41)
Albumin: 2.5 g/dL — ABNORMAL LOW (ref 3.5–5.0)
Alkaline Phosphatase: 443 U/L — ABNORMAL HIGH (ref 38–126)
Anion gap: 10 (ref 5–15)
BUN: 33 mg/dL — ABNORMAL HIGH (ref 8–23)
CO2: 29 mmol/L (ref 22–32)
Calcium: 7 mg/dL — ABNORMAL LOW (ref 8.9–10.3)
Chloride: 89 mmol/L — ABNORMAL LOW (ref 98–111)
Creatinine, Ser: 0.95 mg/dL (ref 0.44–1.00)
GFR, Estimated: >60 mL/min (ref >60)
Glucose, Bld: 135 mg/dL — ABNORMAL HIGH (ref 70–99)
Potassium: 4.7 mmol/L (ref 3.5–5.1)
Sodium: 128 mmol/L — ABNORMAL LOW (ref 135–145)
Total Bilirubin: 2.6 mg/dL — ABNORMAL HIGH (ref 0.3–1.2)
Total Protein: 5.8 g/dL — ABNORMAL LOW (ref 6.5–8.1)

## 2021-11-09 LAB — URIC ACID: Uric Acid, Serum: 3 mg/dL (ref 2.5–7.1)

## 2021-11-09 MED ORDER — CALCIUM GLUCONATE-NACL 2-0.675 GM/100ML-% IV SOLN
2.0000 g | Freq: Once | INTRAVENOUS | Status: AC
  Administered 2021-11-09: 2000 mg via INTRAVENOUS
  Filled 2021-11-09: qty 100

## 2021-11-09 NOTE — Progress Notes (Addendum)
PROGRESS NOTE Pamela [name]  KJZ:[id number] DOB: [birth year] DOA: 11/03/2021 PCP: Marinda Elk, MD   Brief Narrative/Hospital Course: 61 year old female with diabetes hypertension hyperlipidemia hypothyroidism , chronic anemia with extensive stage small cell lung cancer diagnosed 2 weeks prior to admission presented with concern for tumor lysis syndrome AKI from oncology office referral.She had liver bx done 2/13 admitted for AKI/hyperuricemia tumor lysis risk. Seen by [facility] line placed for urgent chemo. She is maanged for elevated liver enzymes likely secondary to tumor burden.  Having ongoing pain go with fentanyl patch oxycodone.  Started on chemotherapy 2/20.  Patient had lower leg edema negative for DVT 11/04/21.  Oncology following started on chemotherapy     Subjective: Seen and examined.  Family at the bedside.  Patient reports he feels much better after Lasix yesterday  She would like to go outside the room with a wheelchair.   No fever overnight.  Weight has been up to 242 pounds from 235lb Labs shows improving sodium level, stable total bili, uric acid 3 nl, LDH increasing 2037> 2624.  Assessment and Plan:  Extensive stage small cell lung cancer-diagnosed 2 weeks ago liver biopsy 2/13 Hyperuricemia concern/risk for tumor lysis syndrome:  MRI brain no intracranial mets but with 5 mm subdural hematoma/subdural hypodense lesion: Dr. Marin Olp on board and completed first cycle of chemotherapy, has had no problems with nausea or vomiting, at risk of tumor lysis syndrome-likely has a mild TLS but LFTs stable, uric acid is stable LDH continues to rise.  Monitor closely all the lab parameters  Elevated liver enzymes: Likely in the setting of substantial tumor burden in the liver.  Remains elevated, total bili stable , trend LFTs.  Recent Labs  Lab 11/05/21 0537 11/06/21 0525 11/07/21 0832 11/08/21 0530 11/09/21 0532  AST 388* 369* 442* 599* 754*  ALT 144* 142* 143* 149* 141*   ALKPHOS 457* 440* 425* 445* 443*  BILITOT 4.0* 3.6* 3.3* 2.7* 2.6*  PROT 6.3* 6.2* 6.1* 6.2* 5.8*  ALBUMIN 2.7* 2.6* 2.5* 2.5* 2.5*    C4 hypodense lesion Mid back pain L Post SDH vs Cancer mets: Seen in MRI.See #1 continue pain control supportive care.  Pain management. patient does not wish to go to through another MRI.  AKI W/ metabolic acidosis: AKI resolved.cont po hydration.  Bicarb improved.  Monitor labs  Hypocalcemia-being repleted again per oncology.  B/L LE edema/hypervolemia/anasarca:Dopplers negative for DVT, BNP borderline 169.  Suspecting multifactorial with patient's hypoalbuminemia, fluid overload.  Off fluids and s/p iv lasix 40 x 1 2/23.  Intake output noted.  Although weight seems to be going up 216> 235> 242 lb-continue to monitor fluid status and reduce Lasix per oncology caution with Lasix.  Filed Weights   11/04/21 0008 11/08/21 0913 11/09/21 0458  Weight: 98 kg 106.6 kg 110.1 kg    Hyponatremia: Likely from hypovolemia sodium improving avoid IV Lasix.  Monitor  Recent Labs  Lab 11/05/21 0537 11/06/21 0525 11/07/21 0832 11/08/21 0530 11/09/21 0532  NA 127* 127* 126* 125* 128*    Acquired hypothyroidism: on Synthroid  Chronic anemia: hb stable, monitor  Recent Labs  Lab 11/05/21 0537 11/06/21 0525 11/07/21 0832 11/08/21 0530 11/09/21 0532  HGB 11.0* 11.1* 11.0* 10.9* 10.7*  HCT 34.7* 34.6* 34.6* 34.1* 32.7*    Leukocytosis mild, likely reactive. Recent Labs  Lab 11/05/21 0537 11/06/21 0525 11/07/21 0832 11/08/21 0530 11/09/21 0532  WBC 15.6* 11.2* 13.8* 11.0* 10.7*    Essential hypertension: BP stable, not on meds  T2DM:  Well-controlled blood sugar holding metformin, monitor blood glucose and sliding scale  Recent Labs  Lab 11/08/21 0728 11/08/21 1114 11/08/21 1659 11/08/21 2148 11/09/21 0730  GLUCAP 106* 180* 168* 172* 146*    Class II Obesity:Patient's Body mass index is 44.4 kg/m. : Will benefit with PCP follow-up, weight  loss  healthy lifestyle and outpatient sleep evaluation.  DVT prophylaxis: enoxaparin (LOVENOX) injection 40 mg Start: 11/08/21 1000 Place TED hose Start: 11/05/21 0711 SCDs Start: 11/03/21 1431 Place TED hose Start: 11/03/21 1431 Code Status:   Code Status: Full Code Family Communication: plan of care discussed with patient/family at bedside.  Disposition: Currently not medically stable for discharge. Status is: Inpatient Remains inpatient appropriate because: Ongoing management of cancer and chemo-monitoring for TLS plan for home once LDH starts to trend down and cleared by hematology  Objective: Vitals last 24 hrs: Vitals:   11/08/21 1606 11/08/21 2103 11/09/21 0433 11/09/21 0458  BP:  (!) 116/37 (!) 107/42   Pulse:  82 81   Resp:  18 17   Temp:  (!) 97.5 F (36.4 C) (!) 97.5 F (36.4 C)   TempSrc:  Oral Oral   SpO2:  90% 91%   Weight:    110.1 kg  Height: 5\' 2"  (1.575 m)      Weight change:   Physical Examination: General exam: AA0x3,older than stated age, weak appearing. HEENT:Oral mucosa moist, Ear/Nose WNL grossly, dentition normal. Respiratory system: bilaterally clear,no use of accessory muscle Cardiovascular system: S1 & S2 +, No JVD,. Gastrointestinal system: Abdomen soft,NT,ND, BS+ Nervous System:Alert, awake, moving extremities and grossly nonfocal Extremities: Nonpitting lower leg edema ,distal peripheral pulses palpable.  Skin: No rashes,no icterus. MSK: Normal muscle bulk,tone, power  Medications reviewed: Scheduled Meds:  antiseptic oral rinse  15 mL Mouth Rinse Q6H   Chlorhexidine Gluconate Cloth  6 each Topical Daily   enoxaparin (LOVENOX) injection  40 mg Subcutaneous Q24H   feeding supplement  237 mL Oral BID BM   fentaNYL  1 patch Transdermal Q72H   insulin aspart  0-9 Units Subcutaneous TID WC   levothyroxine  50 mcg Oral QAC breakfast   multivitamin with minerals  1 tablet Oral Daily   pantoprazole  40 mg Oral BID   polyethylene glycol  17 g  Oral Daily   senna-docusate  1 tablet Oral BID   Continuous Infusions:  calcium gluconate 2,000 mg (11/09/21 0857)      Diet Order             Diet regular Room service appropriate? Yes; Fluid consistency: Thin  Diet effective now                  Nutrition Problem: Increased nutrient needs Etiology: cancer and cancer related treatments Signs/Symptoms: estimated needs Interventions: Ensure Enlive (each supplement provides 350kcal and 20 grams of protein), MVI   Intake/Output Summary (Last 24 hours) at 11/09/2021 1018 Last data filed at 11/08/2021 1839 Gross per 24 hour  Intake 675 ml  Output --  Net 675 ml   Net IO Since Admission: 14,458.48 mL [11/09/21 1018]  Wt Readings from Last 3 Encounters:  11/09/21 110.1 kg  11/02/21 92.5 kg  10/29/21 91.2 kg     Unresulted Labs (From admission, onward)     Start     Ordered   11/10/21 0500  CBC with Differential/Platelet  Daily,   R      11/09/21 0717   11/08/21 0500  Comprehensive metabolic panel  Daily,   R  11/07/21 0715   11/08/21 0500  Uric acid  Daily,   R      11/07/21 0715   11/07/21 0716  Lactate dehydrogenase  Daily,   R      11/07/21 0715          Data Reviewed: I have personally reviewed following labs and imaging studies CBC: Recent Labs  Lab 11/05/21 0537 11/06/21 0525 11/07/21 0832 11/08/21 0530 11/09/21 0532  WBC 15.6* 11.2* 13.8* 11.0* 10.7*  NEUTROABS 14.4* 10.1* 13.1* 10.5* 10.2*  HGB 11.0* 11.1* 11.0* 10.9* 10.7*  HCT 34.7* 34.6* 34.6* 34.1* 32.7*  MCV 91.8 92.0 93.8 92.7 91.6  PLT 330 266 251 235 160   Basic Metabolic Panel: Recent Labs  Lab 11/02/21 1243 11/03/21 1105 11/03/21 1725 11/05/21 0537 11/06/21 0525 11/07/21 0832 11/08/21 0530 11/09/21 0532  NA 126* 126*   < > 127* 127* 126* 125* 128*  K 4.9 4.9   < > 4.5 4.5 4.7 4.8 4.7  CL 88* 89*   < > 88* 89* 88* 88* 89*  CO2 22 19*   < > 23 27 28 27 29   GLUCOSE 125* 121*   < > 134* 137* 157* 129* 135*  BUN 60* 62*   <  > 49* 40* 37* 36* 33*  CREATININE 2.31* 2.34*   < > 1.32* 1.09* 1.11* 1.05* 0.95  CALCIUM 9.3 9.4   < > 8.6* 7.8* 6.9* 6.5* 7.0*  PHOS 3.9 5.0*  --   --   --   --   --   --    < > = values in this interval not displayed.   GFR: Estimated Creatinine Clearance: 72.7 mL/min (by C-G formula based on SCr of 0.95 mg/dL). Liver Function Tests: Recent Labs  Lab 11/05/21 0537 11/06/21 0525 11/07/21 0832 11/08/21 0530 11/09/21 0532  AST 388* 369* 442* 599* 754*  ALT 144* 142* 143* 149* 141*  ALKPHOS 457* 440* 425* 445* 443*  BILITOT 4.0* 3.6* 3.3* 2.7* 2.6*  PROT 6.3* 6.2* 6.1* 6.2* 5.8*  ALBUMIN 2.7* 2.6* 2.5* 2.5* 2.5*   No results for input(s): LIPASE, AMYLASE in the last 168 hours. No results for input(s): AMMONIA in the last 168 hours. Coagulation Profile: No results for input(s): INR, PROTIME in the last 168 hours. Cardiac Enzymes: No results for input(s): CKTOTAL, CKMB, CKMBINDEX, TROPONINI in the last 168 hours. BNP (last 3 results) No results for input(s): PROBNP in the last 8760 hours. HbA1C: No results for input(s): HGBA1C in the last 72 hours. CBG: Recent Labs  Lab 11/08/21 0728 11/08/21 1114 11/08/21 1659 11/08/21 2148 11/09/21 0730  GLUCAP 106* 180* 168* 172* 146*   Lipid Profile: No results for input(s): CHOL, HDL, LDLCALC, TRIG, CHOLHDL, LDLDIRECT in the last 72 hours. Thyroid Function Tests: No results for input(s): TSH, T4TOTAL, FREET4, T3FREE, THYROIDAB in the last 72 hours.  Anemia Panel: No results for input(s): VITAMINB12, FOLATE, FERRITIN, TIBC, IRON, RETICCTPCT in the last 72 hours. Sepsis Labs: No results for input(s): PROCALCITON, LATICACIDVEN in the last 168 hours.  Recent Results (from the past 240 hour(s))  Resp Panel by RT-PCR (Flu A&B, Covid) Nasopharyngeal Swab     Status: None   Collection Time: 11/03/21 11:50 AM   Specimen: Nasopharyngeal Swab; Nasopharyngeal(NP) swabs in vial transport medium  Result Value Ref Range Status   SARS  Coronavirus 2 by RT PCR NEGATIVE NEGATIVE Final    Comment: (NOTE) SARS-CoV-2 target nucleic acids are NOT DETECTED.  The SARS-CoV-2 RNA is generally detectable  in upper respiratory specimens during the acute phase of infection. The lowest concentration of SARS-CoV-2 viral copies this assay can detect is 138 copies/mL. A negative result does not preclude SARS-Cov-2 infection and should not be used as the sole basis for treatment or other patient management decisions. A negative result may occur with  improper specimen collection/handling, submission of specimen other than nasopharyngeal swab, presence of viral mutation(s) within the areas targeted by this assay, and inadequate number of viral copies(<138 copies/mL). A negative result must be combined with clinical observations, patient history, and epidemiological information. The expected result is Negative.  Fact Sheet for Patients:  EntrepreneurPulse.com.au  Fact Sheet for Healthcare Providers:  IncredibleEmployment.be  This test is no t yet approved or cleared by the [place] FDA and  has been authorized for detection and/or diagnosis of SARS-CoV-2 by FDA under an Emergency Use Authorization (EUA). This EUA will remain  in effect (meaning this test can be used) for the duration of the COVID-19 declaration under Section 564(b)(1) of the Act, 21 U.S.C.section 360bbb-3(b)(1), unless the authorization is terminated  or revoked sooner.       Influenza A by PCR NEGATIVE NEGATIVE Final   Influenza B by PCR NEGATIVE NEGATIVE Final    Comment: (NOTE) The Xpert Xpress SARS-CoV-2/FLU/RSV plus assay is intended as an aid in the diagnosis of influenza from Nasopharyngeal swab specimens and should not be used as a sole basis for treatment. Nasal washings and aspirates are unacceptable for Xpert Xpress SARS-CoV-2/FLU/RSV testing.  Fact Sheet for  Patients: EntrepreneurPulse.com.au  Fact Sheet for Healthcare Providers: IncredibleEmployment.be  This test is not yet approved or cleared by the [place] FDA and has been authorized for detection and/or diagnosis of SARS-CoV-2 by FDA under an Emergency Use Authorization (EUA). This EUA will remain in effect (meaning this test can be used) for the duration of the COVID-19 declaration under Section 564(b)(1) of the Act, 21 U.S.C. section 360bbb-3(b)(1), unless the authorization is terminated or revoked.  Performed at [facility], [place] [address], [place], [place] [address]    Antimicrobials: Anti-infectives (From admission, onward)    None     Culture/Microbiology No results found for: SDES, SPECREQUEST, CULT, REPTSTATUS  Other culture-see note   Radiology Studies: No results found.   LOS: 6 days   Antonieta Pert, MD [facility]  11/09/2021, 10:18 AM

## 2021-11-09 NOTE — Progress Notes (Signed)
So far, she is doing well with the chemotherapy although her LFTs are going up.  I suspect this is from the chemotherapy itself.  There also may be a an element of tumor lysis.  Her LDH is 2600.  Thankfully, the bilirubin is trending downward.  It is 2.6.  Her calcium is still quite low at 7.  I will give her another dose of IV calcium.  Her SGPT is 141 SGOT 754.  Again these are little bit more elevated.  The often phosphatase is stable at 443.  Her white cell count is 10.7.  Hemoglobin 10.7.  Platelet count 215,000.  There is no nausea or vomiting.  We will liberalize her diet.  I would be very cautious with IV Lasix.  She does feel a little better.  Her legs are still swollen but not as much.  Again I think a lot of this is from her hepatomegaly and the tumor burden in her liver.  She has had no mouth sores.  Is been no diarrhea.  She is walking.  Her vital signs show temperature of 97.5.  Pulse 81.  Blood pressure 107/42.  Her head neck exam shows no mucositis.  There is no adenopathy in the neck.  Lungs are clear.  Cardiac exam regular rate and rhythm.  Abdomen is obese but soft.  There is no fluid wave.  There is still some hepatomegaly.  Extremities shows some edema in the legs.  There is no erythema.  Neurological exam is nonfocal.  I still do not feel that Pamela [name] is able to go home.  I just think that we are still dealing with active tumor lysis.  I think once we see the LDH going down, then I would feel much better with her being at home.  We will give her some more calcium.  Again, she has tolerated treatment quite well.  We had to do significant dose reduction because of her liver functions.  Her kidney function is normalized.  I am going to stop the allopurinol for right now.  Her uric acid is only 3.  Again we will have to watch this closely.  I appreciate everybody's help with her.  Everybody is done such a great job taking care of her.  She is incredibly impressed with the  compassion of everybody who has been involved with her care.  [name], MD  Psalms 56:4

## 2021-11-10 DIAGNOSIS — C3490 Malignant neoplasm of unspecified part of unspecified bronchus or lung: Secondary | ICD-10-CM | POA: Diagnosis not present

## 2021-11-10 LAB — CBC WITH DIFFERENTIAL/PLATELET
Abs Immature Granulocytes: 0.11 K/uL — ABNORMAL HIGH (ref 0.00–0.07)
Basophils Absolute: 0 K/uL (ref 0.0–0.1)
Basophils Relative: 0 %
Eosinophils Absolute: 0 K/uL (ref 0.0–0.5)
Eosinophils Relative: 0 %
HCT: 32.4 % — ABNORMAL LOW (ref 36.0–46.0)
Hemoglobin: 10.6 g/dL — ABNORMAL LOW (ref 12.0–15.0)
Immature Granulocytes: 1 %
Lymphocytes Relative: 4 %
Lymphs Abs: 0.4 K/uL — ABNORMAL LOW (ref 0.7–4.0)
MCH: 30.1 pg (ref 26.0–34.0)
MCHC: 32.7 g/dL (ref 30.0–36.0)
MCV: 92 fL (ref 80.0–100.0)
Monocytes Absolute: 0 K/uL — ABNORMAL LOW (ref 0.1–1.0)
Monocytes Relative: 0 %
Neutro Abs: 9.7 K/uL — ABNORMAL HIGH (ref 1.7–7.7)
Neutrophils Relative %: 95 %
Platelets: 171 K/uL (ref 150–400)
RBC: 3.52 MIL/uL — ABNORMAL LOW (ref 3.87–5.11)
RDW: 15.9 % — ABNORMAL HIGH (ref 11.5–15.5)
WBC: 10.3 K/uL (ref 4.0–10.5)
nRBC: 0 % (ref 0.0–0.2)

## 2021-11-10 LAB — COMPREHENSIVE METABOLIC PANEL
ALT: 126 U/L — ABNORMAL HIGH (ref 0–44)
AST: 913 U/L — ABNORMAL HIGH (ref 15–41)
Albumin: 2.4 g/dL — ABNORMAL LOW (ref 3.5–5.0)
Alkaline Phosphatase: 432 U/L — ABNORMAL HIGH (ref 38–126)
Anion gap: 10 (ref 5–15)
BUN: 25 mg/dL — ABNORMAL HIGH (ref 8–23)
CO2: 27 mmol/L (ref 22–32)
Calcium: 7.3 mg/dL — ABNORMAL LOW (ref 8.9–10.3)
Chloride: 87 mmol/L — ABNORMAL LOW (ref 98–111)
Creatinine, Ser: 0.81 mg/dL (ref 0.44–1.00)
GFR, Estimated: >60 mL/min (ref >60)
Glucose, Bld: 135 mg/dL — ABNORMAL HIGH (ref 70–99)
Potassium: 4.2 mmol/L (ref 3.5–5.1)
Sodium: 124 mmol/L — ABNORMAL LOW (ref 135–145)
Total Bilirubin: 2.5 mg/dL — ABNORMAL HIGH (ref 0.3–1.2)
Total Protein: 6 g/dL — ABNORMAL LOW (ref 6.5–8.1)

## 2021-11-10 LAB — GLUCOSE, CAPILLARY
Glucose-Capillary: 136 mg/dL — ABNORMAL HIGH (ref 70–99)
Glucose-Capillary: 134 mg/dL — ABNORMAL HIGH (ref 70–99)
Glucose-Capillary: 160 mg/dL — ABNORMAL HIGH (ref 70–99)

## 2021-11-10 LAB — OSMOLALITY, URINE: Osmolality, Ur: 544 mOsm/kg (ref 300–900)

## 2021-11-10 LAB — LACTATE DEHYDROGENASE: LDH: 3495 U/L — ABNORMAL HIGH (ref 98–192)

## 2021-11-10 LAB — URIC ACID: Uric Acid, Serum: 3.8 mg/dL (ref 2.5–7.1)

## 2021-11-10 LAB — SODIUM, URINE, RANDOM: Sodium, Ur: 45 mmol/L

## 2021-11-10 MED ORDER — SODIUM CHLORIDE 0.9% FLUSH
10.0000 mL | Freq: Two times a day (BID) | INTRAVENOUS | Status: DC
  Administered 2021-11-11 – 2021-11-21 (×20): 10 mL

## 2021-11-10 MED ORDER — SODIUM CHLORIDE 0.9% FLUSH
10.0000 mL | INTRAVENOUS | Status: DC | PRN
  Administered 2021-11-14: 10 mL

## 2021-11-10 MED ORDER — SODIUM CHLORIDE 0.9 % IV SOLN: INTRAVENOUS | Status: DC

## 2021-11-10 NOTE — Progress Notes (Addendum)
PROGRESS NOTE Pamela [name]  QPY:[id number] DOB: 13-Mar-1961 DOA: 11/03/2021 PCP: Marinda Elk, MD   Brief Narrative/Hospital Course: 61 year old female with diabetes hypertension hyperlipidemia hypothyroidism , chronic anemia with extensive stage small cell lung cancer diagnosed 2 weeks prior to admission presented with concern for tumor lysis syndrome AKI from oncology office referral.She had liver bx done 2/13 admitted for AKI/hyperuricemia tumor lysis risk. Seen by [facility] line placed for urgent chemo. She is maanged for elevated liver enzymes likely secondary to tumor burden.  Having ongoing pain go with fentanyl patch oxycodone.  Started on chemotherapy 2/20.  Patient had lower leg edema negative for DVT 11/04/21.  Oncology following started on chemotherapy and completed inpatient dose.  Patient's electrolytes were monitored along with LDH and uric acid LFTs due to risk of TLS.     Subjective: Seen and examined.  On the bedside chair, family in the room.  Patient reports he feels better no nausea vomiting abdominal pain. Overnight no fever, blood pressure stable Lab with slightly worsening AST but stable ALT, sodium downtrending to 124, LDH 43349 uric acid 3> 3.8  Assessment and Plan:  Extensive stage small cell lung cancer-diagnosed 2 weeks ago liver biopsy 2/13 Hyperuricemia concern/risk for tumor lysis syndrome:  MRI brain no intracranial mets but with 5 mm subdural hematoma/subdural hypodense lesion: diagnosed 2 weeks PTA S/P Liver biopsy 2/13.  Patient completed chemotherapy inpatient and is being monitored for TLS and any adverse reaction.  Continue pain control with fentanyl patch and oxycodone. Cont plan per Onco- likely needs Port once LUE PICC removed  Elevated liver enzymes: Likely in the setting of substantial tumor burden in the liver. Remains up-AST trending up ALT and alk phos, TB ( 2.5) stable, cont to trend labs  C4 hypodense lesion Mid back pain Lt Post SDH vs  Cancer mets: Seen in MRI.See #1 continue pain control supportive care. Cont pain management.  She does not want to have another MRI  AKI W/ metabolic acidosis: AKI resolved. Cont po hydration.Bicarb improved.Monitor  Hypocalcemia-S/P IV replacement.  Corrected calcium ~ 8.6 due to low albumin of 2.4  B/L LE edema/hypervolemia/anasarca: Dopplers negative for DVT, BNP borderline 169.  Suspecting multifactorial with patient's hypoalbuminemia, fluid overload-Off fluids and s/p iv lasix 40 x 1 DOSE- 2/23. Overall wt is up.  Filed Weights   11/04/21 0008 11/08/21 0913 11/09/21 0458  Weight: 98 kg 106.6 kg 110.1 kg    Hyponatremia: Suspect multifactorial- ?hypervolemia/ SIADH 2/2 lung cancer. Check urine electrolytes/ osmol.  Not Ennever has started normal saline challenge will see the response if sodium does not improve or worsens will consider stopping IV fluids and give dose of Lasix . After iv Lasix sodium did improve from 125> 128 on 2/24 but down today. Trend. Recent Labs  Lab 11/06/21 0525 11/07/21 0832 11/08/21 0530 11/09/21 0532 11/10/21 0500  NA 127* 126* 125* 128* 124*    Acquired hypothyroidism: on Synthroid  Chronic anemia:stable,monitor  Recent Labs  Lab 11/06/21 0525 11/07/21 0832 11/08/21 0530 11/09/21 0532 11/10/21 0500  HGB 11.1* 11.0* 10.9* 10.7* 10.6*  HCT 34.6* 34.6* 34.1* 32.7* 32.4*    Leukocytosis mild,resolved Recent Labs  Lab 11/06/21 0525 11/07/21 0832 11/08/21 0530 11/09/21 0532 11/10/21 0500  WBC 11.2* 13.8* 11.0* 10.7* 10.3    Essential hypertension: BP stable, not on meds  T2DM: Well-controlled blood sugar holding metformin, monitor blood glucose and cont SSI Recent Labs  Lab 11/08/21 2148 11/09/21 0730 11/09/21 1127 11/09/21 1732 11/09/21 2155  GLUCAP 172*  146* 145* 166* 131*    Class II Obesity:Patient's Body mass index is 44.4 kg/m. : Will benefit with PCP follow-up, weight loss  healthy lifestyle and outpatient sleep  evaluation.  DVT prophylaxis: enoxaparin (LOVENOX) injection 40 mg Start: 11/08/21 1000 Place TED hose Start: 11/05/21 0711 SCDs Start: 11/03/21 1431 Place TED hose Start: 11/03/21 1431 Code Status:   Code Status: Full Code Family Communication: plan of care discussed with patient/family at bedside.  Disposition: Currently not medically stable for discharge. Status is: Inpatient Remains inpatient appropriate because: Ongoing management of cancer and chemo-monitoring for TLS plan for home once LDH starts to trend down and cleared by hematology  Objective: Vitals last 24 hrs: Vitals:   11/09/21 0458 11/09/21 1246 11/09/21 2155 11/10/21 0646  BP:  (!) 117/40 (!) 113/40 (!) 115/47  Pulse:  83 81 79  Resp:  _0 Temp:  98.2 F (36.8 C) 97.6 F (36.4 C) 97.8 F (36.6 C)  TempSrc:  Oral Oral Oral  SpO2:  (!) 89% 98% 93%  Weight: 110.1 kg     Height:       Weight change:   Physical Examination: General exam: AA0x3, older than stated age, weak appearing. HEENT:Oral mucosa moist, Ear/Nose WNL grossly, dentition normal. Respiratory system: bilaterally clear,no use of accessory muscle Cardiovascular system: S1 & S2 +, No JVD,. Gastrointestinal system: Abdomen soft,NT,ND,BS+ Nervous System:Alert, awake, moving extremities and grossly nonfocal Extremities: LE ankle edema ++, distal peripheral pulses palpable.  Skin: No rashes,no icterus. MSK: Normal muscle bulk,tone, power   Medications reviewed: Scheduled Meds:  antiseptic oral rinse  15 mL Mouth Rinse Q6H   Chlorhexidine Gluconate Cloth  6 each Topical Daily   enoxaparin (LOVENOX) injection  40 mg Subcutaneous Q24H   feeding supplement  237 mL Oral BID BM   fentaNYL  1 patch Transdermal Q72H   insulin aspart  0-9 Units Subcutaneous TID WC   levothyroxine  50 mcg Oral QAC breakfast   multivitamin with minerals  1 tablet Oral Daily   pantoprazole  40 mg Oral BID   polyethylene glycol  17 g Oral Daily   senna-docusate  1  tablet Oral BID   sodium chloride flush  10-40 mL Intracatheter Q12H   Continuous Infusions:  sodium chloride 75 mL/hr at 11/10/21 3570      Diet Order             Diet regular Room service appropriate? Yes; Fluid consistency: Thin  Diet effective now                  Nutrition Problem: Increased nutrient needs Etiology: cancer and cancer related treatments Signs/Symptoms: estimated needs Interventions: Ensure Enlive (each supplement provides 350kcal and 20 grams of protein), MVI   Intake/Output Summary (Last 24 hours) at 11/10/2021 1022 Last data filed at 11/10/2021 0853 Gross per 24 hour  Intake 64.09 ml  Output --  Net 64.09 ml   Net IO Since Admission: 14,762.57 mL [11/10/21 1022]  Wt Readings from Last 3 Encounters:  11/09/21 110.1 kg  11/02/21 92.5 kg  10/29/21 91.2 kg     Unresulted Labs (From admission, onward)     Start     Ordered   11/10/21 0735  Osmolality, urine  Once,   R        11/10/21 0735   11/10/21 0500  CBC with Differential/Platelet  Daily,   R      11/09/21 0717   11/08/21 0500  Comprehensive metabolic panel  Daily,   R      11/07/21 0715   11/08/21 0500  Uric acid  Daily,   R      11/07/21 0715   11/07/21 0716  Lactate dehydrogenase  Daily,   R      11/07/21 0715          Data Reviewed: I have personally reviewed following labs and imaging studies CBC: Recent Labs  Lab 11/06/21 0525 11/07/21 0832 11/08/21 0530 11/09/21 0532 11/10/21 0500  WBC 11.2* 13.8* 11.0* 10.7* 10.3  NEUTROABS 10.1* 13.1* 10.5* 10.2* 9.7*  HGB 11.1* 11.0* 10.9* 10.7* 10.6*  HCT 34.6* 34.6* 34.1* 32.7* 32.4*  MCV 92.0 93.8 92.7 91.6 92.0  PLT 266 251 235 215 147   Basic Metabolic Panel: Recent Labs  Lab 11/03/21 1105 11/03/21 1725 11/06/21 0525 11/07/21 0832 11/08/21 0530 11/09/21 0532 11/10/21 0500  NA 126*   < > 127* 126* 125* 128* 124*  K 4.9   < > 4.5 4.7 4.8 4.7 4.2  CL 89*   < > 89* 88* 88* 89* 87*  CO2 19*   < > _0 GLUCOSE 121*   < > 137* 157* 129* 135* 135*  BUN 62*   < > 40* 37* 36* 33* 25*  CREATININE 2.34*   < > 1.09* 1.11* 1.05* 0.95 0.81  CALCIUM 9.4   < > 7.8* 6.9* 6.5* 7.0* 7.3*  PHOS 5.0*  --   --   --   --   --   --    < > = values in this interval not displayed.   GFR: Estimated Creatinine Clearance: 85.3 mL/min (by C-G formula based on SCr of 0.81 mg/dL). Liver Function Tests: Recent Labs  Lab 11/06/21 0525 11/07/21 0832 11/08/21 0530 11/09/21 0532 11/10/21 0500  AST 369* 442* 599* 754* 913*  ALT 142* 143* 149* 141* 126*  ALKPHOS 440* 425* 445* 443* 432*  BILITOT 3.6* 3.3* 2.7* 2.6* 2.5*  PROT 6.2* 6.1* 6.2* 5.8* 6.0*  ALBUMIN 2.6* 2.5* 2.5* 2.5* 2.4*   No results for input(s): LIPASE, AMYLASE in the last 168 hours. No results for input(s): AMMONIA in the last 168 hours. Coagulation Profile: No results for input(s): INR, PROTIME in the last 168 hours. Cardiac Enzymes: No results for input(s): CKTOTAL, CKMB, CKMBINDEX, TROPONINI in the last 168 hours. BNP (last 3 results) No results for input(s): PROBNP in the last 8760 hours. HbA1C: No results for input(s): HGBA1C in the last 72 hours. CBG: Recent Labs  Lab 11/08/21 2148 11/09/21 0730 11/09/21 1127 11/09/21 1732 11/09/21 2155  GLUCAP 172* 146* 145* 166* 131*   Lipid Profile: No results for input(s): CHOL, HDL, LDLCALC, TRIG, CHOLHDL, LDLDIRECT in the last 72 hours. Thyroid Function Tests: No results for input(s): TSH, T4TOTAL, FREET4, T3FREE, THYROIDAB in the last 72 hours.  Anemia Panel: No results for input(s): VITAMINB12, FOLATE, FERRITIN, TIBC, IRON, RETICCTPCT in the last 72 hours. Sepsis Labs: No results for input(s): PROCALCITON, LATICACIDVEN in the last 168 hours.  Recent Results (from the past 240 hour(s))  Resp Panel by RT-PCR (Flu A&B, Covid) Nasopharyngeal Swab     Status: None   Collection Time: 11/03/21 11:50 AM   Specimen: Nasopharyngeal Swab; Nasopharyngeal(NP) swabs in vial transport medium   Result Value Ref Range Status   SARS Coronavirus 2 by RT PCR NEGATIVE NEGATIVE Final    Comment: (NOTE) SARS-CoV-2 target nucleic acids are NOT DETECTED.  The SARS-CoV-2 RNA is generally detectable in upper  respiratory specimens during the acute phase of infection. The lowest concentration of SARS-CoV-2 viral copies this assay can detect is 138 copies/mL. A negative result does not preclude SARS-Cov-2 infection and should not be used as the sole basis for treatment or other patient management decisions. A negative result may occur with  improper specimen collection/handling, submission of specimen other than nasopharyngeal swab, presence of viral mutation(s) within the areas targeted by this assay, and inadequate number of viral copies(<138 copies/mL). A negative result must be combined with clinical observations, patient history, and epidemiological information. The expected result is Negative.  Fact Sheet for Patients:  EntrepreneurPulse.com.au  Fact Sheet for Healthcare Providers:  IncredibleEmployment.be  This test is no t yet approved or cleared by the [place] FDA and  has been authorized for detection and/or diagnosis of SARS-CoV-2 by FDA under an Emergency Use Authorization (EUA). This EUA will remain  in effect (meaning this test can be used) for the duration of the COVID-19 declaration under Section 564(b)(1) of the Act, 21 U.S.C.section 360bbb-3(b)(1), unless the authorization is terminated  or revoked sooner.       Influenza A by PCR NEGATIVE NEGATIVE Final   Influenza B by PCR NEGATIVE NEGATIVE Final    Comment: (NOTE) The Xpert Xpress SARS-CoV-2/FLU/RSV plus assay is intended as an aid in the diagnosis of influenza from Nasopharyngeal swab specimens and should not be used as a sole basis for treatment. Nasal washings and aspirates are unacceptable for Xpert Xpress SARS-CoV-2/FLU/RSV testing.  Fact Sheet for  Patients: EntrepreneurPulse.com.au  Fact Sheet for Healthcare Providers: IncredibleEmployment.be  This test is not yet approved or cleared by the [place] FDA and has been authorized for detection and/or diagnosis of SARS-CoV-2 by FDA under an Emergency Use Authorization (EUA). This EUA will remain in effect (meaning this test can be used) for the duration of the COVID-19 declaration under Section 564(b)(1) of the Act, 21 U.S.C. section 360bbb-3(b)(1), unless the authorization is terminated or revoked.  Performed at [facility], [place] [address], [place], [place] [address]    Antimicrobials: Anti-infectives (From admission, onward)    None     Culture/Microbiology No results found for: SDES, SPECREQUEST, CULT, REPTSTATUS  Other culture-see note   Radiology Studies: No results found.   LOS: 7 days   Antonieta Pert, MD [facility]  11/10/2021, 10:22 AM

## 2021-11-10 NOTE — Progress Notes (Signed)
She is still doing quite well after chemotherapy.  She has not had any nausea or vomiting.  She has had no problems with fever.  There is no mouth sores.  She seems to be eating a little bit better.  Her liver test might be a little bit better in some regards.  I do not have the LDH back yet.  Her bilirubin is 2.5.  Her ALT is 126 AST 913.  Alkaline phosphatase is 432.  The calcium is 7.3.  Her sodium and chloride are quite low.  This might indicate some hypovolemia.  I think would not be a bad idea to see about some fluid for her.  I guess another possibility might be SIADH.  However, I would think that the chloride would not be this low.  The white cell count is 10.3.  Hemoglobin 10.6.  Platelet count 171,000.  She has had no bleeding.  She is urinating okay.  She says her legs feel a little bit better.  She did get 1 dose of Lasix couple days ago.  I think we can probably get the peripheral catheter out of the right arm.  I also think we can probably stop the cardiac monitor.  Her temperature is 97.8.  Pulse 79.  Blood pressure 115/47.  Her lungs sound relatively clear.  Cardiac exam regular rate and rhythm.  Abdomen is soft.  There is no obvious fluid wave.  She is somewhat obese.  There is no guarding or rebound tenderness.  There is also hepatomegaly.  Extremities show some edema in the legs.  Neurological exam is nonfocal.  I still a little bit uneasy about her able to go home.  I just worry about the sodium in the chloride.  Again I do not think that she has SIADH but this may need to be looked into given that she does have small cell lung cancer.  1 thing also thought about was seen about getting a Port-A-Cath put into her while she is in the hospital.  Her blood counts were fine.  Her renal function is much better.  Her liver function is improving so I think that while she is here, we could see about a Port-A-Cath being placed and the PICC line removed for when she does go home.  I also  thought about MRI with contrast to look at the brain.  However, she says that she just is unable to lie down for that right now.  I do not see any neurological issues so I think we can hold on this.  I do appreciate everybody's help.  I do know that this is incredibly complicated.  She has come along way in 1 week.  I just want to make sure that she is going to be safe when it does come time for her to go home.   [name], MD  2 Chronicles 1:10

## 2021-11-11 ENCOUNTER — Inpatient Hospital Stay (HOSPITAL_COMMUNITY): Payer: BC Managed Care – PPO

## 2021-11-11 DIAGNOSIS — C3490 Malignant neoplasm of unspecified part of unspecified bronchus or lung: Secondary | ICD-10-CM | POA: Diagnosis not present

## 2021-11-11 LAB — GLUCOSE, CAPILLARY
Glucose-Capillary: 129 mg/dL — ABNORMAL HIGH (ref 70–99)
Glucose-Capillary: 135 mg/dL — ABNORMAL HIGH (ref 70–99)
Glucose-Capillary: 133 mg/dL — ABNORMAL HIGH (ref 70–99)
Glucose-Capillary: 125 mg/dL — ABNORMAL HIGH (ref 70–99)
Glucose-Capillary: 124 mg/dL — ABNORMAL HIGH (ref 70–99)

## 2021-11-11 LAB — COMPREHENSIVE METABOLIC PANEL
ALT: 102 U/L — ABNORMAL HIGH (ref 0–44)
AST: 788 U/L — ABNORMAL HIGH (ref 15–41)
Albumin: 2.4 g/dL — ABNORMAL LOW (ref 3.5–5.0)
Alkaline Phosphatase: 390 U/L — ABNORMAL HIGH (ref 38–126)
Anion gap: 9 (ref 5–15)
BUN: 19 mg/dL (ref 8–23)
CO2: 26 mmol/L (ref 22–32)
Calcium: 7.3 mg/dL — ABNORMAL LOW (ref 8.9–10.3)
Chloride: 93 mmol/L — ABNORMAL LOW (ref 98–111)
Creatinine, Ser: 0.63 mg/dL (ref 0.44–1.00)
GFR, Estimated: >60 mL/min (ref >60)
Glucose, Bld: 141 mg/dL — ABNORMAL HIGH (ref 70–99)
Potassium: 4 mmol/L (ref 3.5–5.1)
Sodium: 128 mmol/L — ABNORMAL LOW (ref 135–145)
Total Bilirubin: 2.8 mg/dL — ABNORMAL HIGH (ref 0.3–1.2)
Total Protein: 5.6 g/dL — ABNORMAL LOW (ref 6.5–8.1)

## 2021-11-11 LAB — CBC WITH DIFFERENTIAL/PLATELET
Abs Immature Granulocytes: 0.37 10*3/uL — ABNORMAL HIGH (ref 0.00–0.07)
Basophils Absolute: 0.1 10*3/uL (ref 0.0–0.1)
Basophils Relative: 1 %
Eosinophils Absolute: 0 10*3/uL (ref 0.0–0.5)
Eosinophils Relative: 0 %
HCT: 31.3 % — ABNORMAL LOW (ref 36.0–46.0)
Hemoglobin: 9.8 g/dL — ABNORMAL LOW (ref 12.0–15.0)
Immature Granulocytes: 5 %
Lymphocytes Relative: 4 %
Lymphs Abs: 0.3 10*3/uL — ABNORMAL LOW (ref 0.7–4.0)
MCH: 29.3 pg (ref 26.0–34.0)
MCHC: 31.3 g/dL (ref 30.0–36.0)
MCV: 93.7 fL (ref 80.0–100.0)
Monocytes Absolute: 0 10*3/uL — ABNORMAL LOW (ref 0.1–1.0)
Monocytes Relative: 0 %
Neutro Abs: 7.4 10*3/uL (ref 1.7–7.7)
Neutrophils Relative %: 90 %
Platelets: 121 10*3/uL — ABNORMAL LOW (ref 150–400)
RBC: 3.34 MIL/uL — ABNORMAL LOW (ref 3.87–5.11)
RDW: 15.7 % — ABNORMAL HIGH (ref 11.5–15.5)
WBC: 8.2 10*3/uL (ref 4.0–10.5)
nRBC: 0 % (ref 0.0–0.2)

## 2021-11-11 LAB — URIC ACID: Uric Acid, Serum: 4.2 mg/dL (ref 2.5–7.1)

## 2021-11-11 LAB — LACTATE DEHYDROGENASE: LDH: 3083 U/L — ABNORMAL HIGH (ref 98–192)

## 2021-11-11 IMAGING — DX DG CHEST 1V PORT
1 series · 1 of 1 positions shown · non-contrast
Comparison: [DATE].  CT, [DATE]

CLINICAL DATA: Sudden shortness of breath beginning this morning.
Patient began chemotherapy on [DATE] for colon carcinoma. Also
recent diagnosis of small cell lung carcinoma.

EXAM:
PORTABLE CHEST 1 VIEW

[chest ap]
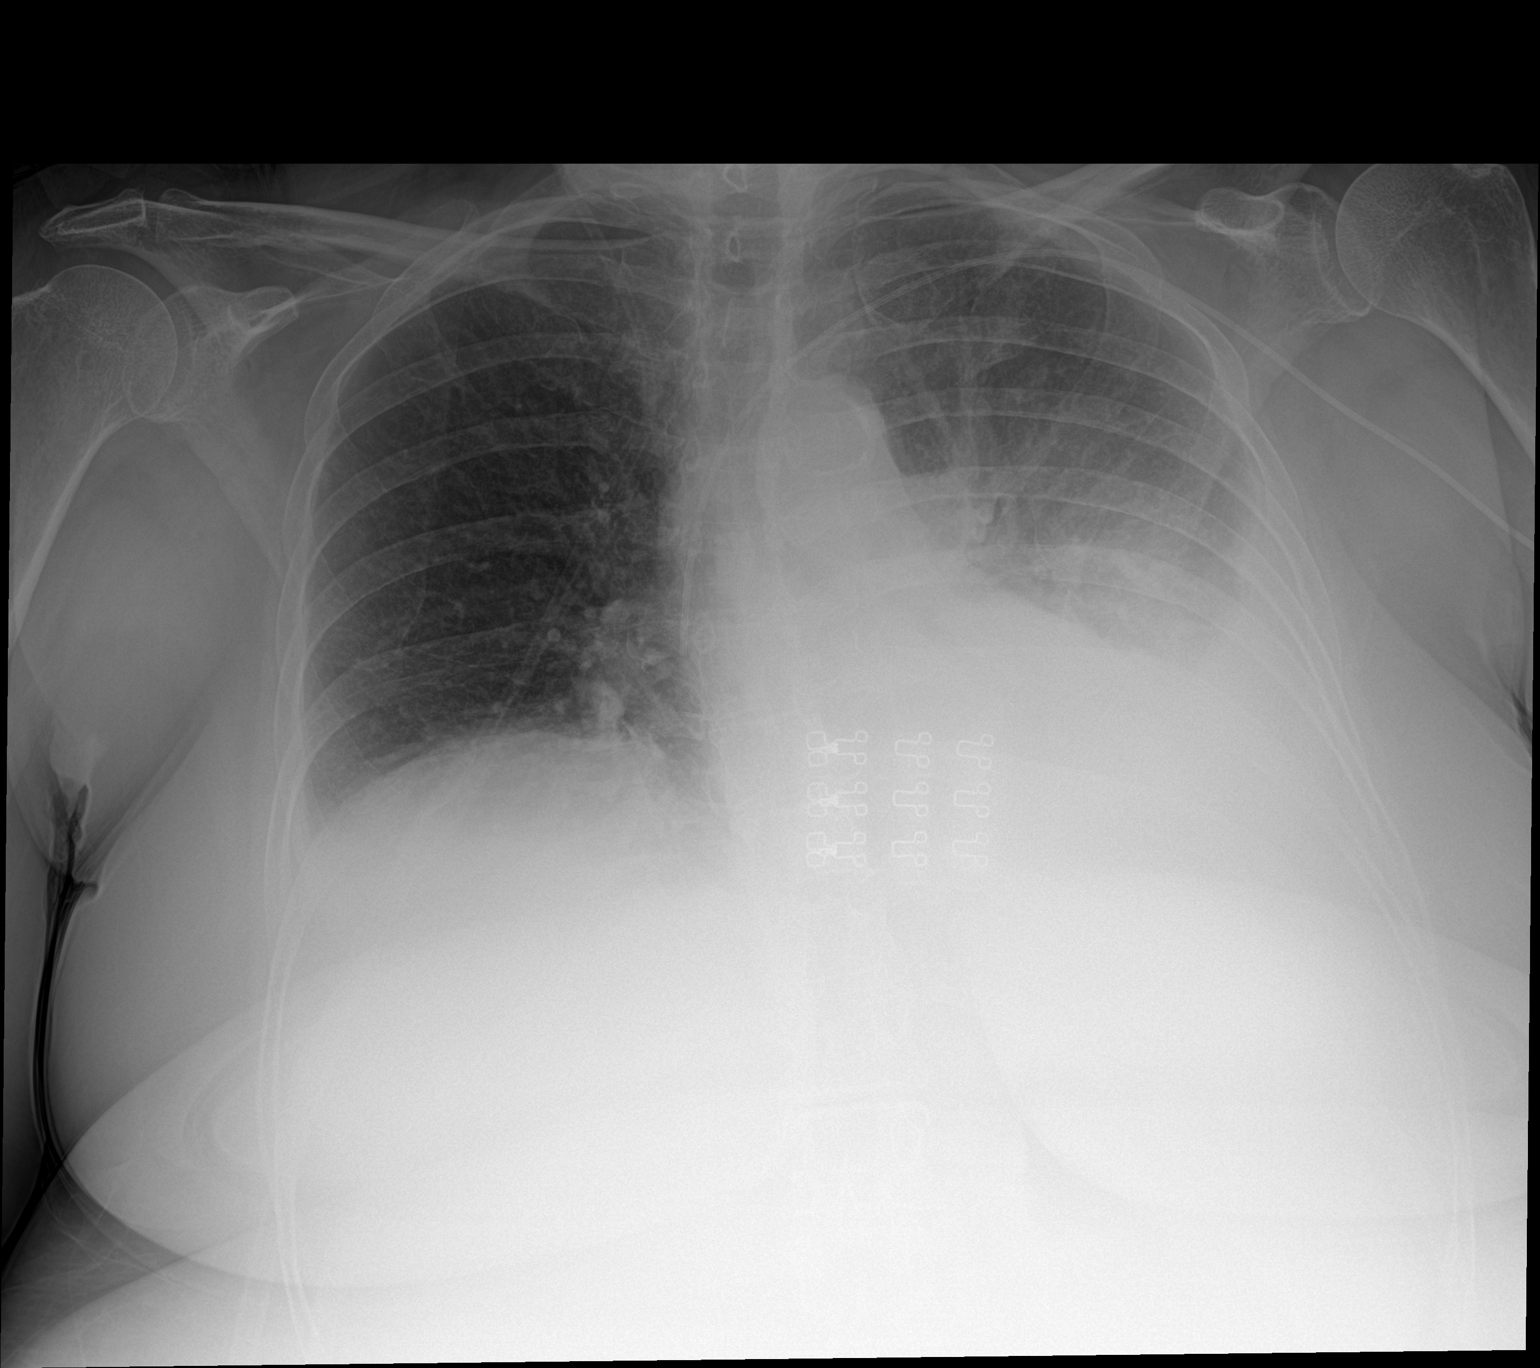

[1 of 1 positions shown; findings below may reference images not displayed]

FINDINGS: There is opacity extending from the left perihilar region to the
left lung base, obscuring the left hemidiaphragm and portions of the
left heart border, consistent with a combination of a moderate
effusion and parenchymal lung opacity. The lung opacity is
consistent with the known left lower lobe malignancy likely with
associated atelectasis.

Lung volumes are low. Minor opacity noted at the right lung base
consistent with atelectasis. Remainder of the right lung is clear.

No pneumothorax.

Cardiac silhouette normal in size.

Left-sided PICC is well positioned, tip in the lower superior vena
cava.

Skeletal structures are grossly intact.
IMPRESSION: 1. Moderate left pleural effusion with associated left perihilar and
lower lung opacity, the latter consistent with a combination of the
patient's known malignancy and probable atelectasis. The pleural
effusion has significantly increased when compared to the prior CT
scan from [DATE].
2. No evidence of pulmonary edema.

## 2021-11-11 MED ORDER — FUROSEMIDE 10 MG/ML IJ SOLN
40.0000 mg | Freq: Once | INTRAMUSCULAR | Status: AC
  Administered 2021-11-11: 40 mg via INTRAVENOUS
  Filled 2021-11-11: qty 4

## 2021-11-11 NOTE — Progress Notes (Addendum)
PROGRESS NOTE Pamela [name]  ENI:[id number] DOB: [birth year] DOA: 11/03/2021 PCP: Marinda Elk, MD   Brief Narrative/Hospital Course: 61 year old female with diabetes hypertension hyperlipidemia hypothyroidism , chronic anemia with extensive stage small cell lung cancer diagnosed 2 weeks prior to admission presented with concern for tumor lysis syndrome AKI from oncology office referral.She had liver bx done 2/13 admitted for AKI/hyperuricemia tumor lysis risk. Seen by [facility] line placed for urgent chemo. She is maanged for elevated liver enzymes likely secondary to tumor burden.  Having ongoing pain go with fentanyl patch oxycodone.  Started on chemotherapy 2/20.  Patient had lower leg edema negative for DVT 11/04/21.  Oncology following started on chemotherapy and completed inpatient dose.  Patient's electrolytes were monitored along with LDH and uric acid LFTs due to risk of TLS.     Subjective: Seen this morning family at the bedside patient complains of shortness of breath leg edema  No other new complaints. LDH now downtrending  Assessment and Plan:  Extensive stage small cell lung cancer-diagnosed 2 weeks ago liver biopsy 2/13 Hyperuricemia concern/risk for tumor lysis syndrome:  MRI brain no intracranial mets but with 5 mm subdural hematoma/subdural hypodense lesion: diagnosed 2 weeks PTA S/P Liver biopsy 2/13.  Patient completed chemotherapy inpatient and is being monitored for TLS and any adverse reaction.  Continue pain control with fentanyl patch and oxycodone.  No LDH is improving, uric acid remains elevated but within normal range.  Elevated liver enzymes: Likely in the setting of substantial tumor burden in the liver.  LFTs now downtrending, cont to trend  C4 hypodense lesion Mid back pain Lt Post SDH vs Cancer mets: Seen in MRI.See #1 continue pain control supportive care. Cont pain management.  She does not want to have another MRI  AKI W/ metabolic acidosis: Renal  function/bicarb level stable.    Hypocalcemia-S/P IV replacement.Corrected calcium ~ 8.6 due to low albumin of 2.4  B/L LE edema/hypervolemia/anasarca/shortness of breath: Dopplers negative for DVT, BNP borderline 169.  Suspecting multifactorial with patient's hypoalbuminemia, fluid overload-placed on gentle IV fluids by oncology-cut down fluids-obtain chest x-ray and consider IV Lasix as she is symptomatic with shortness of breath.  Filed Weights   11/08/21 0913 11/09/21 0458 11/11/21 0437  Weight: 106.6 kg 110.1 kg 105.2 kg    Hyponatremia: Suspect multifactorial- ?hypervolemia/ SIADH 2/2 lung cancer. Check urine electrolytes/ osmol.  Dr Marin Olp has started normal saline challenge-sodium still rising , but patient having edema0- cut ivf to 50 cc. Recent Labs  Lab 11/07/21 0832 11/08/21 0530 11/09/21 0532 11/10/21 0500 11/11/21 0559  NA 126* 125* 128* 124* 128*     Acquired hypothyroidism: cont Synthroid  Chronic anemia: Hemoglobin remains overall stable in 9 to 11 g.  Continue to monitor.   Recent Labs  Lab 11/07/21 0832 11/08/21 0530 11/09/21 0532 11/10/21 0500 11/11/21 0559  HGB 11.0* 10.9* 10.7* 10.6* 9.8*  HCT 34.6* 34.1* 32.7* 32.4* 31.3*     Leukocytosis resolved.   Recent Labs  Lab 11/07/21 0832 11/08/21 0530 11/09/21 0532 11/10/21 0500 11/11/21 0559  WBC 13.8* 11.0* 10.7* 10.3 8.2     Essential hypertension: BP controlled  T2DM: Well-controlled sugar, continue to hold metformin, cbg and ssi to cont Recent Labs  Lab 11/09/21 2155 11/10/21 1115 11/10/21 1650 11/10/21 2039 11/11/21 0732  GLUCAP 131* 136* 134* 160* 135*     Class II Obesity:Patient's Body mass index is 42.43 kg/m. : Will benefit with PCP follow-up, weight loss  healthy lifestyle and outpatient sleep evaluation.  DVT prophylaxis: enoxaparin (LOVENOX) injection 40 mg Start: 11/08/21 1000 Place TED hose Start: 11/05/21 0711 SCDs Start: 11/03/21 1431 Place TED hose Start: 11/03/21  1431 Code Status:   Code Status: Full Code Family Communication: plan of care discussed with patient/family at bedside.  Disposition: Currently not medically stable for discharge. Status is: Inpatient Remains inpatient appropriate because: Ongoing management of cancer and chemo-monitoring for TLS plan for home once LDH starts to trend down and cleared by hematology  Objective: Vitals last 24 hrs: Vitals:   11/10/21 0646 11/10/21 1310 11/10/21 2040 11/11/21 0437  BP: (!) 115/47 (!) 113/45 (!) 119/54 (!) 120/56  Pulse: 79 80 83 79  Resp: 18 20 16 20   Temp: 97.8 F (36.6 C) 97.8 F (36.6 C) 98.2 F (36.8 C) 98.2 F (36.8 C)  TempSrc: Oral Oral Oral Oral  SpO2: 93% (!) 87% 95% 96%  Weight:    105.2 kg  Height:       Weight change:   Physical Examination: General exam: AA0X3,older than stated age, weak appearing. HEENT:Oral mucosa moist, Ear/Nose WNL grossly, dentition normal. Respiratory system: bilaterally diminished,no use of accessory muscle Cardiovascular system: S1 & S2 +, No JVD,. Gastrointestinal system: Abdomen soft, distended, NT,ND, BS+ Nervous System:Alert, awake, moving extremities and grossly nonfocal Extremities: edema B/L LE,distal peripheral pulses palpable.  Skin: No rashes,no icterus. MSK: Normal muscle bulk,tone, power   Medications reviewed: Scheduled Meds:  antiseptic oral rinse  15 mL Mouth Rinse Q6H   Chlorhexidine Gluconate Cloth  6 each Topical Daily   enoxaparin (LOVENOX) injection  40 mg Subcutaneous Q24H   feeding supplement  237 mL Oral BID BM   fentaNYL  1 patch Transdermal Q72H   insulin aspart  0-9 Units Subcutaneous TID WC   levothyroxine  50 mcg Oral QAC breakfast   multivitamin with minerals  1 tablet Oral Daily   pantoprazole  40 mg Oral BID   polyethylene glycol  17 g Oral Daily   senna-docusate  1 tablet Oral BID   sodium chloride flush  10-40 mL Intracatheter Q12H   Continuous Infusions:  sodium chloride 75 mL/hr at 11/11/21  1037      Diet Order             Diet regular Room service appropriate? Yes; Fluid consistency: Thin  Diet effective now                  Nutrition Problem: Increased nutrient needs Etiology: cancer and cancer related treatments Signs/Symptoms: estimated needs Interventions: Ensure Enlive (each supplement provides 350kcal and 20 grams of protein), MVI   Intake/Output Summary (Last 24 hours) at 11/11/2021 1040 Last data filed at 11/11/2021 0532 Gross per 24 hour  Intake 1779.21 ml  Output --  Net 1779.21 ml    Net IO Since Admission: 16,541.78 mL [11/11/21 1040]  Wt Readings from Last 3 Encounters:  11/11/21 105.2 kg  11/02/21 92.5 kg  10/29/21 91.2 kg     Unresulted Labs (From admission, onward)     Start     Ordered   11/10/21 0500  CBC with Differential/Platelet  Daily,   R      11/09/21 0717   11/08/21 0500  Comprehensive metabolic panel  Daily,   R      11/07/21 0715   11/08/21 0500  Uric acid  Daily,   R      11/07/21 0715          Data Reviewed: I have personally reviewed following labs and  imaging studies CBC: Recent Labs  Lab 11/07/21 0832 11/08/21 0530 11/09/21 0532 11/10/21 0500 11/11/21 0559  WBC 13.8* 11.0* 10.7* 10.3 8.2  NEUTROABS 13.1* 10.5* 10.2* 9.7* 7.4  HGB 11.0* 10.9* 10.7* 10.6* 9.8*  HCT 34.6* 34.1* 32.7* 32.4* 31.3*  MCV 93.8 92.7 91.6 92.0 93.7  PLT 251 235 215 171 121*    Basic Metabolic Panel: Recent Labs  Lab 11/07/21 0832 11/08/21 0530 11/09/21 0532 11/10/21 0500 11/11/21 0559  NA 126* 125* 128* 124* 128*  K 4.7 4.8 4.7 4.2 4.0  CL 88* 88* 89* 87* 93*  CO2 28 27 29 27 26   GLUCOSE 157* 129* 135* 135* 141*  BUN 37* 36* 33* 25* 19  CREATININE 1.11* 1.05* 0.95 0.81 0.63  CALCIUM 6.9* 6.5* 7.0* 7.3* 7.3*    GFR: Estimated Creatinine Clearance: 84.1 mL/min (by C-G formula based on SCr of 0.63 mg/dL). Liver Function Tests: Recent Labs  Lab 11/07/21 0832 11/08/21 0530 11/09/21 0532 11/10/21 0500  11/11/21 0559  AST 442* 599* 754* 913* 788*  ALT 143* 149* 141* 126* 102*  ALKPHOS 425* 445* 443* 432* 390*  BILITOT 3.3* 2.7* 2.6* 2.5* 2.8*  PROT 6.1* 6.2* 5.8* 6.0* 5.6*  ALBUMIN 2.5* 2.5* 2.5* 2.4* 2.4*    No results for input(s): LIPASE, AMYLASE in the last 168 hours. No results for input(s): AMMONIA in the last 168 hours. Coagulation Profile: No results for input(s): INR, PROTIME in the last 168 hours. Cardiac Enzymes: No results for input(s): CKTOTAL, CKMB, CKMBINDEX, TROPONINI in the last 168 hours. BNP (last 3 results) No results for input(s): PROBNP in the last 8760 hours. HbA1C: No results for input(s): HGBA1C in the last 72 hours. CBG: Recent Labs  Lab 11/09/21 2155 11/10/21 1115 11/10/21 1650 11/10/21 2039 11/11/21 0732  GLUCAP 131* 136* 134* 160* 135*    Lipid Profile: No results for input(s): CHOL, HDL, LDLCALC, TRIG, CHOLHDL, LDLDIRECT in the last 72 hours. Thyroid Function Tests: No results for input(s): TSH, T4TOTAL, FREET4, T3FREE, THYROIDAB in the last 72 hours.  Anemia Panel: No results for input(s): VITAMINB12, FOLATE, FERRITIN, TIBC, IRON, RETICCTPCT in the last 72 hours. Sepsis Labs: No results for input(s): PROCALCITON, LATICACIDVEN in the last 168 hours.  Recent Results (from the past 240 hour(s))  Resp Panel by RT-PCR (Flu A&B, Covid) Nasopharyngeal Swab     Status: None   Collection Time: 11/03/21 11:50 AM   Specimen: Nasopharyngeal Swab; Nasopharyngeal(NP) swabs in vial transport medium  Result Value Ref Range Status   SARS Coronavirus 2 by RT PCR NEGATIVE NEGATIVE Final    Comment: (NOTE) SARS-CoV-2 target nucleic acids are NOT DETECTED.  The SARS-CoV-2 RNA is generally detectable in upper respiratory specimens during the acute phase of infection. The lowest concentration of SARS-CoV-2 viral copies this assay can detect is 138 copies/mL. A negative result does not preclude SARS-Cov-2 infection and should not be used as the sole basis  for treatment or other patient management decisions. A negative result may occur with  improper specimen collection/handling, submission of specimen other than nasopharyngeal swab, presence of viral mutation(s) within the areas targeted by this assay, and inadequate number of viral copies(<138 copies/mL). A negative result must be combined with clinical observations, patient history, and epidemiological information. The expected result is Negative.  Fact Sheet for Patients:  EntrepreneurPulse.com.au  Fact Sheet for Healthcare Providers:  IncredibleEmployment.be  This test is no t yet approved or cleared by the [place] FDA and  has been authorized for detection and/or diagnosis  of SARS-CoV-2 by FDA under an Emergency Use Authorization (EUA). This EUA will remain  in effect (meaning this test can be used) for the duration of the COVID-19 declaration under Section 564(b)(1) of the Act, 21 U.S.C.section 360bbb-3(b)(1), unless the authorization is terminated  or revoked sooner.       Influenza A by PCR NEGATIVE NEGATIVE Final   Influenza B by PCR NEGATIVE NEGATIVE Final    Comment: (NOTE) The Xpert Xpress SARS-CoV-2/FLU/RSV plus assay is intended as an aid in the diagnosis of influenza from Nasopharyngeal swab specimens and should not be used as a sole basis for treatment. Nasal washings and aspirates are unacceptable for Xpert Xpress SARS-CoV-2/FLU/RSV testing.  Fact Sheet for Patients: EntrepreneurPulse.com.au  Fact Sheet for Healthcare Providers: IncredibleEmployment.be  This test is not yet approved or cleared by the [place] FDA and has been authorized for detection and/or diagnosis of SARS-CoV-2 by FDA under an Emergency Use Authorization (EUA). This EUA will remain in effect (meaning this test can be used) for the duration of the COVID-19 declaration under Section 564(b)(1) of the Act, 21  U.S.C. section 360bbb-3(b)(1), unless the authorization is terminated or revoked.  Performed at [facility], [place] [address] [place], [place] [address]    Antimicrobials: Anti-infectives (From admission, onward)    None     Culture/Microbiology No results found for: SDES, SPECREQUEST, CULT, REPTSTATUS  Other culture-see note   Radiology Studies: No results found.   LOS: 8 days   Antonieta Pert, MD [facility]  11/11/2021, 10:40 AM

## 2021-11-11 NOTE — Progress Notes (Signed)
Marland Kitchen  HEMATOLOGY/ONCOLOGY INPATIENT PROGRESS NOTE  Date of Service: 11/11/2021  Inpatient Attending: .Antonieta Pert, MD   SUBJECTIVE  Patient was seen in oncologic follow-up at the request of Dr. Marin Olp.  She notes no acute new symptoms overnight.  Still feeling somewhat short of breath with minimal movement.  Notes right upper abdominal discomfort. No nausea no vomiting.  Reasonable p.o. intake.  OBJECTIVE:  NAD  PHYSICAL EXAMINATION: . Vitals:   11/10/21 0646 11/10/21 1310 11/10/21 2040 11/11/21 0437  BP: (!) 115/47 (!) 113/45 (!) 119/54 (!) 120/56  Pulse: 79 80 83 79  Resp: _0 Temp: 97.8 F (36.6 C) 97.8 F (36.6 C) 98.2 F (36.8 C) 98.2 F (36.8 C)  TempSrc: Oral Oral Oral Oral  SpO2: 93% (!) 87% 95% 96%  Weight:    232 lb (105.2 kg)  Height:       Filed Weights   11/08/21 0913 11/09/21 0458 11/11/21 0437  Weight: 235 lb (106.6 kg) 242 lb 11.6 oz (110.1 kg) 232 lb (105.2 kg)   .Body mass index is 42.43 kg/m.  LUNGS: clear to auscultation with normal respiratory effort HEART: regular rate & rhythm,  no murmurs ABDOMEN: abdomen soft, non-tender, normoactive bowel sounds  PSYCH: alert & oriented x 3 with fluent speech NEURO: no focal motor/sensory deficits Bilateral lower extremity 2+  MEDICAL HISTORY:  Past Medical History:  Diagnosis Date   Colon cancer ([place]) 2003   Diabetes mellitus without complication ([place])    Hypertension    Personal history of chemotherapy 2003   colon cancer   Uterine cancer ([place]) 2007    SURGICAL HISTORY: Past Surgical History:  Procedure Laterality Date   ABDOMINAL HYSTERECTOMY     BREAST EXCISIONAL BIOPSY Right 11/26/2010   neg/- PROLIFERATIVE FIBROCYSTIC CHANGE WITH ASSOCIATED    CHOLECYSTECTOMY     COLON SURGERY     2003   COLONOSCOPY     COLONOSCOPY WITH PROPOFOL N/A 12/11/2018   Procedure: COLONOSCOPY WITH PROPOFOL;  Surgeon: Lollie Sails, MD;  Location: [facility] ENDOSCOPY;  Service: Endoscopy;  Laterality:  N/A;    SOCIAL HISTORY: Social History   Socioeconomic History   Marital status: Married    Spouse name: Not on file   Number of children: Not on file   Years of education: Not on file   Highest education level: Not on file  Occupational History   Not on file  Tobacco Use   Smoking status: Former    Packs/day: 1.00    Years: 43.00    Pack years: 43.00    Types: Cigarettes    Quit date: 10/17/2021    Years since quitting: 0.0   Smokeless tobacco: Never   Tobacco comments:    1 to 2 cigarettes per day now  Vaping Use   Vaping Use: Never used  Substance and Sexual Activity   Alcohol use: Not Currently   Drug use: Never   Sexual activity: Not on file  Other Topics Concern   Not on file  Social History Narrative   Not on file   Social Determinants of Health   Financial Resource Strain: Not on file  Food Insecurity: Not on file  Transportation Needs: Not on file  Physical Activity: Not on file  Stress: Not on file  Social Connections: Not on file  Intimate Partner Violence: Not on file    FAMILY HISTORY: Family History  Problem Relation Age of Onset   Breast cancer Maternal Aunt 60   Breast cancer  Paternal Aunt 71   Breast cancer Cousin 18   Breast cancer Cousin 92    ALLERGIES:  is allergic to rofecoxib.  MEDICATIONS:  Scheduled Meds:  antiseptic oral rinse  15 mL Mouth Rinse Q6H   Chlorhexidine Gluconate Cloth  6 each Topical Daily   enoxaparin (LOVENOX) injection  40 mg Subcutaneous Q24H   feeding supplement  237 mL Oral BID BM   fentaNYL  1 patch Transdermal Q72H   insulin aspart  0-9 Units Subcutaneous TID WC   levothyroxine  50 mcg Oral QAC breakfast   multivitamin with minerals  1 tablet Oral Daily   pantoprazole  40 mg Oral BID   polyethylene glycol  17 g Oral Daily   senna-docusate  1 tablet Oral BID   sodium chloride flush  10-40 mL Intracatheter Q12H   Continuous Infusions:  sodium chloride 50 mL/hr at 11/11/21 1100   PRN  Meds:.acetaminophen, albuterol, alum & mag hydroxide-simeth, heparin lock flush, lidocaine, LORazepam, ondansetron, oxyCODONE, prochlorperazine, senna-docusate, sodium chloride flush, temazepam  REVIEW OF SYSTEMS:    10 Point review of Systems was done is negative except as noted above.   LABORATORY DATA:  I have reviewed the data as listed  . CBC Latest Ref Rng & Units 11/11/2021 11/10/2021 11/09/2021  WBC 4.0 - 10.5 K/uL 8.2 10.3 10.7(H)  Hemoglobin 12.0 - 15.0 g/dL 9.8(L) 10.6(L) 10.7(L)  Hematocrit 36.0 - 46.0 % 31.3(L) 32.4(L) 32.7(L)  Platelets 150 - 400 K/uL 121(L) 171 215    . CMP Latest Ref Rng & Units 11/11/2021 11/10/2021 11/09/2021  Glucose 70 - 99 mg/dL 141(H) 135(H) 135(H)  BUN 8 - 23 mg/dL 19 25(H) 33(H)  Creatinine 0.44 - 1.00 mg/dL 0.63 0.81 0.95  Sodium 135 - 145 mmol/L 128(L) 124(L) 128(L)  Potassium 3.5 - 5.1 mmol/L 4.0 4.2 4.7  Chloride 98 - 111 mmol/L 93(L) 87(L) 89(L)  CO2 22 - 32 mmol/L _0 Calcium 8.9 - 10.3 mg/dL 7.3(L) 7.3(L) 7.0(L)  Total Protein 6.5 - 8.1 g/dL 5.6(L) 6.0(L) 5.8(L)  Total Bilirubin 0.3 - 1.2 mg/dL 2.8(H) 2.5(H) 2.6(H)  Alkaline Phos 38 - 126 U/L 390(H) 432(H) 443(H)  AST 15 - 41 U/L 788(H) 913(H) 754(H)  ALT 0 - 44 U/L 102(H) 126(H) 141(H)   SURGICAL PATHOLOGY  CASE: [id number]  PATIENT: Chiamaka Rojek  Surgical Pathology Report      Specimen Submitted:  A. Liver, biopsy   Clinical History: Left lung mass with mediastinal and pleural  involvement, thoracic nodal involvement, and liver metastases.  Previous history of colon carcinoma with mucinous features, right  colectomy in 2003 at [facility].  Previous history of uterine cancer 2007.      DIAGNOSIS:  A. LIVER, RIGHT LOBE; ULTRASOUND-GUIDED CORE BIOPSY:  - METASTATIC SMALL CELL CARCINOMA.    RADIOGRAPHIC STUDIES: I have personally reviewed the radiological images as listed and agreed with the findings in the report. CT CHEST W CONTRAST  Result Date:  10/18/2021 CLINICAL DATA:  Abnormal CT of the abdomen and pelvis for which further evaluation with chest CT was suggested. EXAM: CT CHEST WITH CONTRAST TECHNIQUE: Multidetector CT imaging of the chest was performed during intravenous contrast administration. RADIATION DOSE REDUCTION: This exam was performed according to the departmental dose-optimization program which includes automated exposure control, adjustment of the mA and/or kV according to patient size and/or use of iterative reconstruction technique. CONTRAST:  40m OMNIPAQUE IOHEXOL 300 MG/ML  SOLN COMPARISON:  Comparison made with abdominal CT of the same date. Also with  chest CT from May of 2021. FINDINGS: Cardiovascular: Calcified atheromatous plaque of the thoracic aorta. No aneurysmal dilation. Heart size is normal. There are numerous pre pericardial lymph nodes with enlargement, see below. Central pulmonary vasculature is remarkable for narrowing due to extensive LEFT hilar adenopathy of the LEFT pulmonary artery. Otherwise not well evaluated on this venous phase. Mediastinum/Nodes: Extensive mediastinal and LEFT hilar adenopathy. LEFT thoracic inlet lymph node 11 mm (image 13/2) LEFT paratracheal lymph node anterior to the esophagus (image 23/2) 14 mm. Low LEFT paratracheal blending into AP window adenopathy (image 50/2) 16 mm. Subcarinal adenopathy, 17 mm (image 60/2) LEFT hilar nodal mass obscuring LEFT pulmonary artery measuring up to 4.0 x 2.6 cm (image 61/2) Pre pericardial nodal enlargement largest anterior to the RIGHT heart (image 98/2) 18 mm. Juxta esophageal lymph node with enlargement (image 108/2) 12 mm. No RIGHT hilar adenopathy. Lungs/Pleura: Extensive pleural disease with pleural studding involving the entire LEFT chest, occasional pleural nodules tracking over the LEFT lung apex with most pronounced pleural involvement in the dependent aspect of the chest in the inferior posterior costodiaphragmatic recess. Here pleural involvement  invades the fat adjacent to the aorta (image 111/2) masslike area measuring up to 2.6 x 1.9 cm is contiguous with nodular pleural thickening that extends along the entire posterior LEFT chest tracking to the mid and upper chest mixed with pleural fluid. Discrete nodules such as an area on image 77/2 track along the dependent and lateral greater than the anterior chest towards the LEFT lung apex and are scattered in terms of distribution, the largest area measuring approximately 1.8 x 1.0 cm (image 77/2) Masslike area in the superior segment of the AA LEFT lower lobe measuring 4.3 x 3.6 cm (image 49/2) this is contiguous with nodular masslike soft tissue that extends to the LEFT hilum (image 56/2) pleural nodularity of the major fissure also demonstrated. RIGHT chest is clear. Upper Abdomen: Diffusely nodular and heterogeneous appearance of the liver with variable density throughout the liver and suggestion of masslike areas underlying steatotic changes is not well evaluated, better assessed on the prior CT of the abdomen which shows clear hepatic metastatic disease. Pancreas, visualized portions as well as the spleen without acute process. Adrenal glands are unremarkable. Musculoskeletal: Many of the areas of pleural disease along the LEFT chest appear to involve the extrapleural fat. No acute musculoskeletal process IMPRESSION: 1. Pulmonary mass with extensive interstitial disease, mediastinal adenopathy and pleural metastases, most suspicious for bronchogenic neoplasm. 2. Diffusely nodular and heterogeneous liver show signs of metastatic disease that are better seen on the CT of the abdomen and pelvis. 3. Aortic atherosclerosis. Aortic Atherosclerosis (ICD10-I70.0). Electronically Signed   By: Zetta Bills M.D.   On: 10/18/2021 14:46   MR BRAIN WO CONTRAST  Result Date: 11/03/2021 CLINICAL DATA:  Initial evaluation for non-small cell lung cancer, staging. EXAM: MRI HEAD WITHOUT CONTRAST TECHNIQUE:  Multiplanar, multiecho pulse sequences of the brain and surrounding structures were obtained without intravenous contrast. COMPARISON:  Prior head CT from 07/29/2016. FINDINGS: Brain: Cerebral volume within normal limits. Scattered subcentimeter foci of T2/FLAIR hyperintensity involving the periventricular, deep, and subcortical white matter both cerebral hemispheres, nonspecific, but most likely related chronic microvascular ischemic disease, mild in nature. No evidence for acute or subacute infarct. Gray-white matter differentiation maintained. No encephalomalacia to suggest chronic cortical infarction. Small extra-axial collection overlying the left parieto-occipital convexity measures up to 5 mm, likely a small subacute left subdural hematoma (series 11, image 14). No significant mass effect. No  other acute or chronic intracranial blood products. No mass lesion, midline shift or mass effect. No evidence for intracranial metastatic disease. No hydrocephalus. Pituitary gland suprasellar region within normal limits. Midline structures intact. Vascular: Major intracranial vascular flow voids are maintained. Skull and upper cervical spine: Craniocervical junction within normal limits. Question T1 hypointense lesion measuring approximately 1 cm within the C4 vertebral body, nonspecific, but could reflect an osseous metastasis (series 9, image 12). No other definite focal marrow replacing lesion. Small focus of FLAIR hyperintensity noted involving the left occipital scalp, superficial to the underlying left extra-axial hemorrhage, suspected to reflect an evolving small soft tissue contusion (series 11, image 15). Scalp soft tissues otherwise unremarkable. Sinuses/Orbits: Globes and orbital soft tissues within normal limits. Paranasal sinuses are clear. No mastoid effusion. Other: None. IMPRESSION: 1. 5 mm extra-axial collection overlying the left parieto-occipital convexity, likely a small subdural hematoma, likely  subacute. No associated mass effect. 2. No other acute intracranial abnormality. No evidence for intracranial metastatic disease. 3. 1 cm T1 hypointense lesion/area within the C4 vertebral body, nonspecific and possibly degenerative, although an osseous metastasis could also have this appearance. Follow-up examination with dedicated MRI of the cervical spine suggested for further evaluation. 4. Mild chronic microvascular ischemic disease. Critical Value/emergent results were called by telephone at the time of interpretation on 11/03/2021 at 11:32 pm to provider JARED GARDNER , who verbally acknowledged these results. Electronically Signed   By: Jeannine Boga M.D.   On: 11/03/2021 23:34   CT ABDOMEN PELVIS W CONTRAST  Result Date: 10/18/2021 CLINICAL DATA:  Elevated LFTs and abdominal pain. EXAM: CT ABDOMEN AND PELVIS WITH CONTRAST TECHNIQUE: Multidetector CT imaging of the abdomen and pelvis was performed using the standard protocol following bolus administration of intravenous contrast. RADIATION DOSE REDUCTION: This exam was performed according to the departmental dose-optimization program which includes automated exposure control, adjustment of the mA and/or kV according to patient size and/or use of iterative reconstruction technique. CONTRAST:  131m OMNIPAQUE IOHEXOL 300 MG/ML  SOLN COMPARISON:  CT chest 01/19/2020 FINDINGS: Lower chest: Partially loculated scratch set there is a small partially loculated left pleural effusion identified. Signs of pleural spread of tumor within the left hemithorax identified with multiple pleural based soft tissue nodules overlying the visualized portions of the left lung base. Right lung appears clear. Enlarged right cardiophrenic angle lymph node measures 1.7 cm, image 8/2. Left cardiophrenic angle lymph node is enlarged measuring 1.2 cm, image 14/2. Hepatobiliary: The liver has a pseudo cirrhotic appearance due to diffuse liver metastases: Index lesion within the  posterior dome measures 2.8 x 2.8 cm, image 7/2. Posterior lateral right lobe of liver lesion measures 2.1 x 2.3 cm, image 24/2. Inferior right lobe of liver lesion measures 2.8 x 2.5 cm, image 42/2. Index lesion within the lateral segment of left hepatic lobe measures 2.1 by 2.0 cm, image 23/2 Status post cholecystectomy.  No bile duct dilatation. Pancreas: Unremarkable. No pancreatic ductal dilatation or surrounding inflammatory changes. Spleen: Normal in size without focal abnormality. Adrenals/Urinary Tract: Normal adrenal glands. No suspicious mass or hydronephrosis identified bilaterally. Small cyst within inferior pole of right kidney measures 1.5 cm. The urinary bladder is unremarkable. Stomach/Bowel: Stomach is nondistended. Postsurgical changes from right hemicolectomy with enterocolonic anastomosis. No bowel wall thickening, inflammation, or distension. Vascular/Lymphatic: Aortic atherosclerosis. No aneurysm. Upper abdominal vascularity is patent. No adenopathy identified within the abdomen or pelvis. Reproductive: Status post hysterectomy.  No adnexal mass. Other: No ascites or focal fluid collections. Musculoskeletal: No acute  or significant osseous findings. IMPRESSION: 1. Diffuse liver metastases. 2. Signs of pleural spread of tumor within the left hemithorax with small partially loculated left pleural effusion as well as multiple pleural based soft tissue nodules overlying the visualized portions of the left lung base. Recommend further evaluation of the chest with dedicated contrast enhanced CT of the chest. 3. Enlarged cardiophrenic angle lymph nodes compatible with nodal metastasis. 4. Status post right hemicolectomy with enterocolonic anastomosis. 5. Aortic Atherosclerosis (ICD10-I70.0). 6. These results were called by telephone at the time of interpretation on 10/18/2021 at 1:12 pm to provider [facility] , who verbally acknowledged these results. Electronically Signed   By: Kerby Moors  M.D.   On: 10/18/2021 13:13   US BIOPSY (LIVER)  Result Date: 10/29/2021 INDICATION: History of colon and uterine cancer now with liver lesions worrisome for metastatic disease. Please perform ultrasound-guided biopsy for tissue diagnostic purposes. EXAM: ULTRASOUND GUIDED LIVER LESION BIOPSY COMPARISON:  CT of the chest, abdomen pelvis-10/18/2021 MEDICATIONS: None ANESTHESIA/SEDATION: Moderate (conscious) sedation was employed during this procedure as administered by the Interventional Radiology RN. A total of Versed 0.5 mg and Fentanyl 25 mcg was administered intravenously. Moderate Sedation Time: 15 minutes. The patient's level of consciousness and vital signs were monitored continuously by radiology nursing throughout the procedure under my direct supervision. COMPLICATIONS: None immediate. PROCEDURE: Informed written consent was obtained from the patient after a discussion of the risks, benefits and alternatives to treatment. The patient understands and consents the procedure. A timeout was performed prior to the initiation of the procedure. Ultrasound scanning was performed of the right upper abdominal quadrant demonstrates innumerable hypoechoic hepatic lesions many more than were appreciated on preceding abdominal CT. A dominant approximately 3.8 x 3.0 cm hypoechoic mass within the caudal aspect the right lobe of the liver (image 14) was targeted for biopsy given location and sonographic window. The procedure was planned. The right upper abdominal quadrant was prepped and draped in the usual sterile fashion. The overlying soft tissues were anesthetized with 1% lidocaine with epinephrine. A 17 gauge, 6.8 cm co-axial needle was advanced into a peripheral aspect of the lesion. This was followed by 7 core biopsies with an 18 gauge core device under direct ultrasound guidance. The coaxial needle tract was embolized with a small amount of Gel-Foam slurry and superficial hemostasis was obtained with manual  compression. Post procedural scanning was negative for definitive area of hemorrhage or additional complication. A dressing was placed. The patient tolerated the procedure well without immediate post procedural complication. IMPRESSION: 1. Technically successful ultrasound guided core needle biopsy of dominant mass within the caudal aspect of the right lobe liver. 2. Note, innumerable liver lesions were identified on today's procedure, many more than were questioned on preceding abdominal CT. Electronically Signed   By: Sandi Mariscal M.D.   On: 10/29/2021 10:20   DG Chest Port 1 View  Result Date: 11/11/2021 CLINICAL DATA:  Sudden shortness of breath beginning this morning. Patient began chemotherapy on 11/05/2021 for colon carcinoma. Also recent diagnosis of small cell lung carcinoma. EXAM: PORTABLE CHEST 1 VIEW COMPARISON:  11/19/2010.  CT, 10/18/2021 FINDINGS: There is opacity extending from the left perihilar region to the left lung base, obscuring the left hemidiaphragm and portions of the left heart border, consistent with a combination of a moderate effusion and parenchymal lung opacity. The lung opacity is consistent with the known left lower lobe malignancy likely with associated atelectasis. Lung volumes are low. Minor opacity noted at the right lung  base consistent with atelectasis. Remainder of the right lung is clear. No pneumothorax. Cardiac silhouette normal in size. Left-sided PICC is well positioned, tip in the lower superior vena cava. Skeletal structures are grossly intact. IMPRESSION: 1. Moderate left pleural effusion with associated left perihilar and lower lung opacity, the latter consistent with a combination of the patient's known malignancy and probable atelectasis. The pleural effusion has significantly increased when compared to the prior CT scan from 10/18/2021. 2. No evidence of pulmonary edema. Electronically Signed   By: Lajean Manes M.D.   On: 11/11/2021 11:34   VAS [name] LOWER  EXTREMITY VENOUS (DVT)  Result Date: 11/05/2021  Lower Venous DVT Study Patient Name:  LAROSA RHINES  Date of Exam:   11/04/2021 Medical Rec #: [id number]     Accession #:    [id number] Date of Birth: December 28, 1960     Patient Gender: F Patient Age:   61 years Exam Location:  [facility] Procedure:      VAS [name] LOWER EXTREMITY VENOUS (DVT) Referring Phys: Annamaria Boots XU --------------------------------------------------------------------------------  Indications: Swelling.  Risk Factors: Cancer Small cell lung with liver mets. Limitations: Significant edema. Comparison Study: No prior study on file Performing Technologist: Sharion Dove RVS  Examination Guidelines: A complete evaluation includes B-mode imaging, spectral Doppler, color Doppler, and power Doppler as needed of all accessible portions of each vessel. Bilateral testing is considered an integral part of a complete examination. Limited examinations for reoccurring indications may be performed as noted. The reflux portion of the exam is performed with the patient in reverse Trendelenburg.  +---------+---------------+---------+-----------+----------+-------------------+  RIGHT     Compressibility Phasicity Spontaneity Properties Thrombus Aging       +---------+---------------+---------+-----------+----------+-------------------+  CFV       Full                                             pulsatile            +---------+---------------+---------+-----------+----------+-------------------+  SFJ       Full                                                                  +---------+---------------+---------+-----------+----------+-------------------+  FV Prox   Full                                                                  +---------+---------------+---------+-----------+----------+-------------------+  FV Mid    Full                                                                   +---------+---------------+---------+-----------+----------+-------------------+  FV Distal  patent by color and                                                              Doppler              +---------+---------------+---------+-----------+----------+-------------------+  PFV       Full                                                                  +---------+---------------+---------+-----------+----------+-------------------+  POP       Full            No        No                                          +---------+---------------+---------+-----------+----------+-------------------+  PTV       Full                                                                  +---------+---------------+---------+-----------+----------+-------------------+  PERO      Full                                                                  +---------+---------------+---------+-----------+----------+-------------------+   +---------+---------------+---------+-----------+----------+--------------+  LEFT      Compressibility Phasicity Spontaneity Properties Thrombus Aging  +---------+---------------+---------+-----------+----------+--------------+  CFV       Full                                             pulsatile       +---------+---------------+---------+-----------+----------+--------------+  SFJ       Full                                                             +---------+---------------+---------+-----------+----------+--------------+  FV Prox   Full                                                             +---------+---------------+---------+-----------+----------+--------------+  FV Mid    Full                                                             +---------+---------------+---------+-----------+----------+--------------+  FV Distal Full                                                              +---------+---------------+---------+-----------+----------+--------------+  PFV       Full                                                             +---------+---------------+---------+-----------+----------+--------------+  POP       Full            No        No                                     +---------+---------------+---------+-----------+----------+--------------+  PTV       Full                                                             +---------+---------------+---------+-----------+----------+--------------+  PERO      Full                                                             +---------+---------------+---------+-----------+----------+--------------+     Summary: BILATERAL: - No evidence of deep vein thrombosis seen in the lower extremities, bilaterally. -No evidence of popliteal cyst, bilaterally.   *See table(s) above for measurements and observations. Electronically signed by Monica Martinez MD on 11/05/2021 at 2:35:15 PM.    Final    IR PICC PLACEMENT LEFT >5 YRS INC IMG GUIDE  Result Date: 11/03/2021 INDICATION: Patient with a history of small cell lung cancer and need of durable venous access for chemotherapy. Interventional radiology asked to place a PICC. EXAM: ULTRASOUND AND FLUOROSCOPIC GUIDED PICC LINE INSERTION MEDICATIONS: 1% lidocaine, 1 mL CONTRAST:  None FLUOROSCOPY TIME:  1 minute 36 seconds  (9  mGy) COMPLICATIONS: None immediate. TECHNIQUE: The procedure, risks, benefits, and alternatives were explained to the patient and informed written consent was obtained. The left upper extremity was prepped with chlorhexidine in a sterile fashion, and a sterile drape was applied covering the operative field. Maximum barrier sterile technique with sterile gowns and gloves were used for the procedure. A timeout was performed prior to the initiation of the procedure. Local anesthesia was provided with 1% lidocaine. After the overlying soft tissues were anesthetized with 1%  lidocaine, a micropuncture kit was utilized to access the left brachial vein. Real-time ultrasound guidance was utilized for vascular access including the acquisition of a permanent ultrasound image documenting patency of the accessed vessel. A guidewire was advanced to the level of the superior caval-atrial junction for measurement purposes and the PICC line was cut to length. A  peel-away sheath was placed and a 43 cm, 5 [place], dual lumen was inserted to level of the superior caval-atrial junction. A post procedure spot fluoroscopic was obtained. The catheter easily aspirated and flushed and was secured in place. A dressing was placed. The patient tolerated the procedure well without immediate post procedural complication. PICC line placed by Dr. Markus Daft FINDINGS: After catheter placement, the tip lies within the superior cavoatrial junction. The catheter aspirates and flushes normally and is ready for immediate use. IMPRESSION: Successful ultrasound and fluoroscopic guided placement of a left brachial vein approach, 43 cm, 5 French, dual lumen PICC with tip at the superior caval-atrial junction. The PICC line is ready for immediate use. Read by: Soyla Dryer, NP Electronically Signed   By: Markus Daft M.D.   On: 11/03/2021 17:20    ASSESSMENT & PLAN:   61 year old female with  #1 Extensive stage small cell lung cancer #2 liver mets with abnormal liver function tests #3 hyponatremia #4 small possible subdural hemorrhage. #5 status post AKI-improved renal function #6 hypothyroidism PLAN .-Patient is status post cycle 1 of carboplatin etoposide chemotherapy from 2/20-2/22/2023 per Dr. Marin Olp. -She feels she tolerated chemotherapy well but still having shortness of breath and abnormal liver function tests and electrolyte issues. -We will defer to Dr.Enever regarding need for G-CSF. -Management of fluid overload and electrolyte issues per hospital medicine. -Dr. Marin Olp will continue to follow  from tomorrow.  No other change in oncologic plans at this time.   Sullivan Lone MD [place] AAHIVMS [facility] Hematology/Oncology Physician [facility]

## 2021-11-11 NOTE — Progress Notes (Signed)
Unable to scan Fentanyl patch due to barcode on package being torn when opening package. Mardi Mainland, RN verified that new Fentanyl patch was placed on patient's left arm. Tegaderm placed over patch & dated.

## 2021-11-12 ENCOUNTER — Inpatient Hospital Stay (HOSPITAL_COMMUNITY): Payer: BC Managed Care – PPO

## 2021-11-12 DIAGNOSIS — C3490 Malignant neoplasm of unspecified part of unspecified bronchus or lung: Secondary | ICD-10-CM | POA: Diagnosis not present

## 2021-11-12 LAB — CBC WITH DIFFERENTIAL/PLATELET
Abs Immature Granulocytes: 0.28 10*3/uL — ABNORMAL HIGH (ref 0.00–0.07)
Basophils Absolute: 0 10*3/uL (ref 0.0–0.1)
Basophils Relative: 1 %
Eosinophils Absolute: 0 10*3/uL (ref 0.0–0.5)
Eosinophils Relative: 0 %
HCT: 29.4 % — ABNORMAL LOW (ref 36.0–46.0)
Hemoglobin: 9.2 g/dL — ABNORMAL LOW (ref 12.0–15.0)
Immature Granulocytes: 6 %
Lymphocytes Relative: 6 %
Lymphs Abs: 0.3 10*3/uL — ABNORMAL LOW (ref 0.7–4.0)
MCH: 29.4 pg (ref 26.0–34.0)
MCHC: 31.3 g/dL (ref 30.0–36.0)
MCV: 93.9 fL (ref 80.0–100.0)
Monocytes Absolute: 0 10*3/uL — ABNORMAL LOW (ref 0.1–1.0)
Monocytes Relative: 1 %
Neutro Abs: 4.3 10*3/uL (ref 1.7–7.7)
Neutrophils Relative %: 86 %
Platelets: 103 10*3/uL — ABNORMAL LOW (ref 150–400)
RBC: 3.13 MIL/uL — ABNORMAL LOW (ref 3.87–5.11)
RDW: 15.6 % — ABNORMAL HIGH (ref 11.5–15.5)
WBC: 5 10*3/uL (ref 4.0–10.5)
nRBC: 0 % (ref 0.0–0.2)

## 2021-11-12 LAB — GLUCOSE, CAPILLARY
Glucose-Capillary: 140 mg/dL — ABNORMAL HIGH (ref 70–99)
Glucose-Capillary: 150 mg/dL — ABNORMAL HIGH (ref 70–99)
Glucose-Capillary: 142 mg/dL — ABNORMAL HIGH (ref 70–99)
Glucose-Capillary: 153 mg/dL — ABNORMAL HIGH (ref 70–99)

## 2021-11-12 LAB — COMPREHENSIVE METABOLIC PANEL
ALT: 84 U/L — ABNORMAL HIGH (ref 0–44)
AST: 596 U/L — ABNORMAL HIGH (ref 15–41)
Albumin: 2.2 g/dL — ABNORMAL LOW (ref 3.5–5.0)
Alkaline Phosphatase: 347 U/L — ABNORMAL HIGH (ref 38–126)
Anion gap: 6 (ref 5–15)
BUN: 16 mg/dL (ref 8–23)
CO2: 28 mmol/L (ref 22–32)
Calcium: 7.8 mg/dL — ABNORMAL LOW (ref 8.9–10.3)
Chloride: 98 mmol/L (ref 98–111)
Creatinine, Ser: 0.62 mg/dL (ref 0.44–1.00)
GFR, Estimated: >60 mL/min (ref >60)
Glucose, Bld: 135 mg/dL — ABNORMAL HIGH (ref 70–99)
Potassium: 3.7 mmol/L (ref 3.5–5.1)
Sodium: 132 mmol/L — ABNORMAL LOW (ref 135–145)
Total Bilirubin: 2 mg/dL — ABNORMAL HIGH (ref 0.3–1.2)
Total Protein: 5.6 g/dL — ABNORMAL LOW (ref 6.5–8.1)

## 2021-11-12 LAB — URIC ACID: Uric Acid, Serum: 4.8 mg/dL (ref 2.5–7.1)

## 2021-11-12 LAB — PROCALCITONIN: Procalcitonin: 0.61 ng/mL

## 2021-11-12 IMAGING — DX DG CHEST 2V
2 series · 2 of 2 positions shown · non-contrast
Comparison: Previous studies including the examination of
[DATE]

CLINICAL DATA: Pleural effusion, lung carcinoma

EXAM:
CHEST - 2 VIEW

[chest pa]
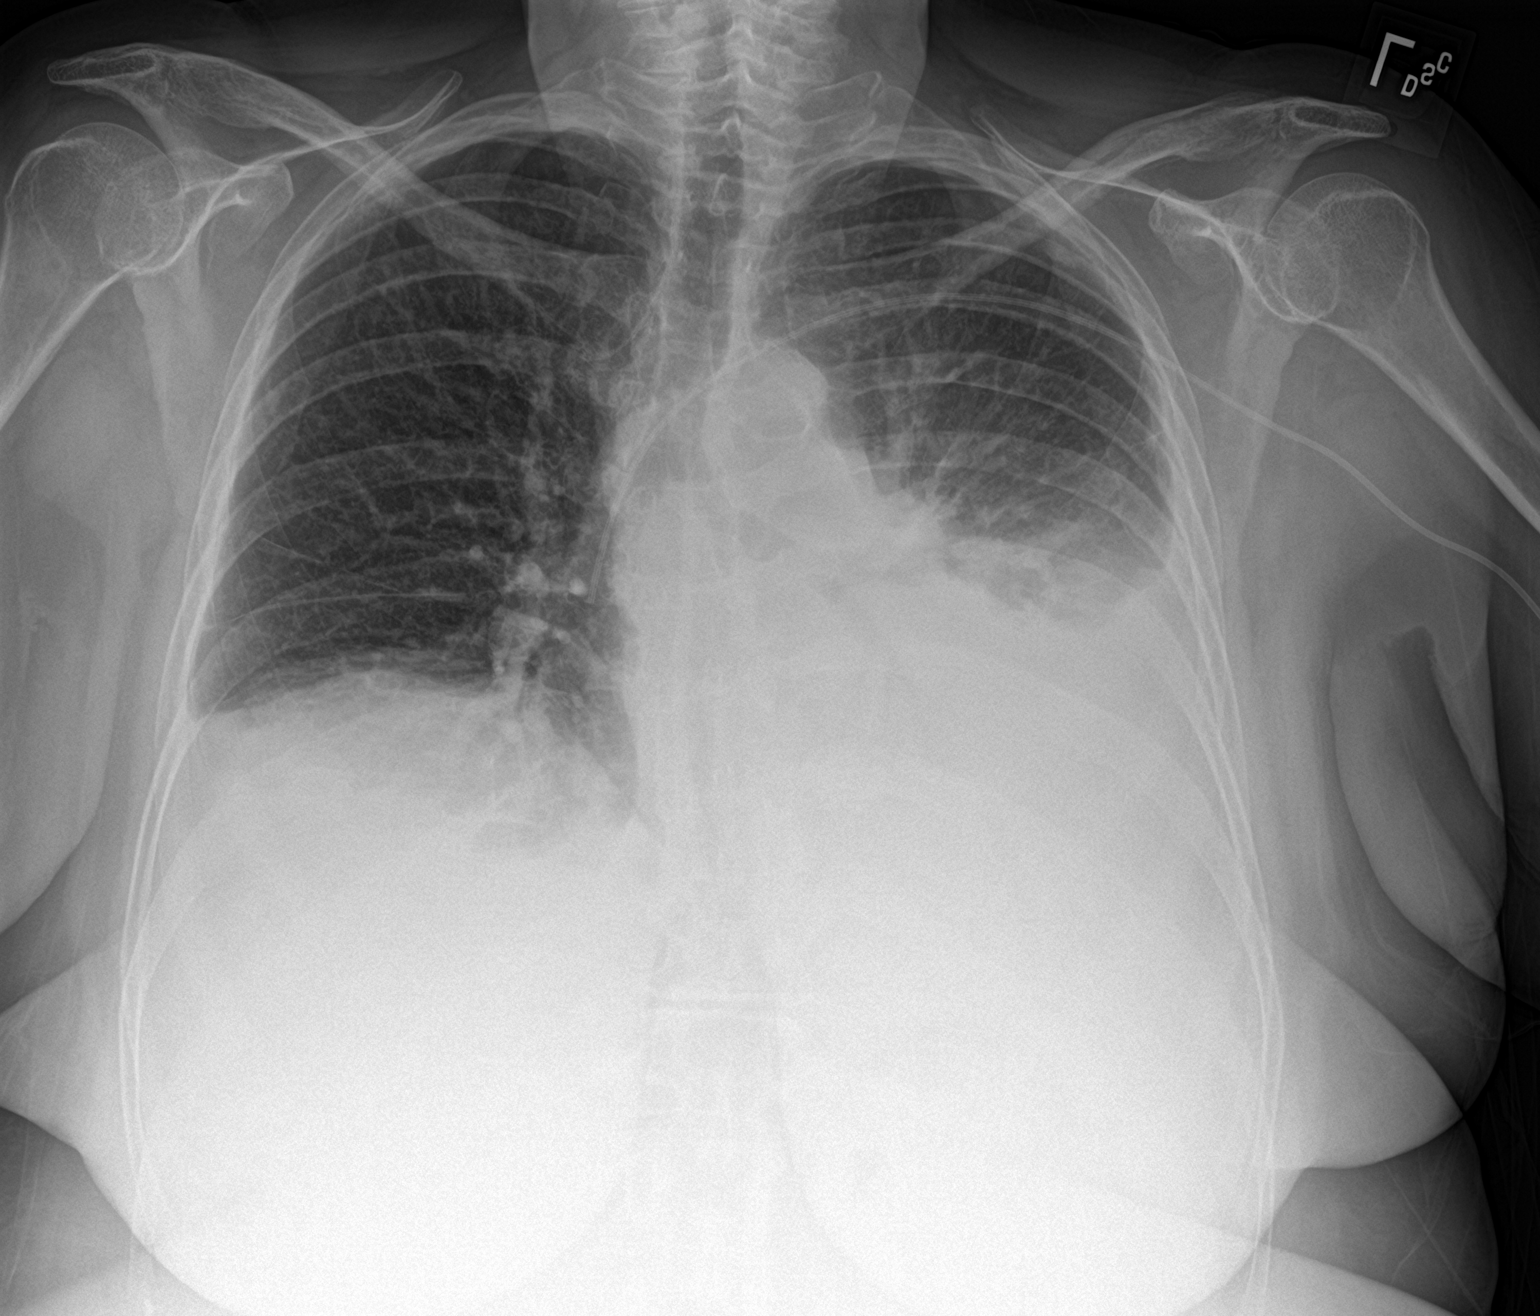

[chest lat]
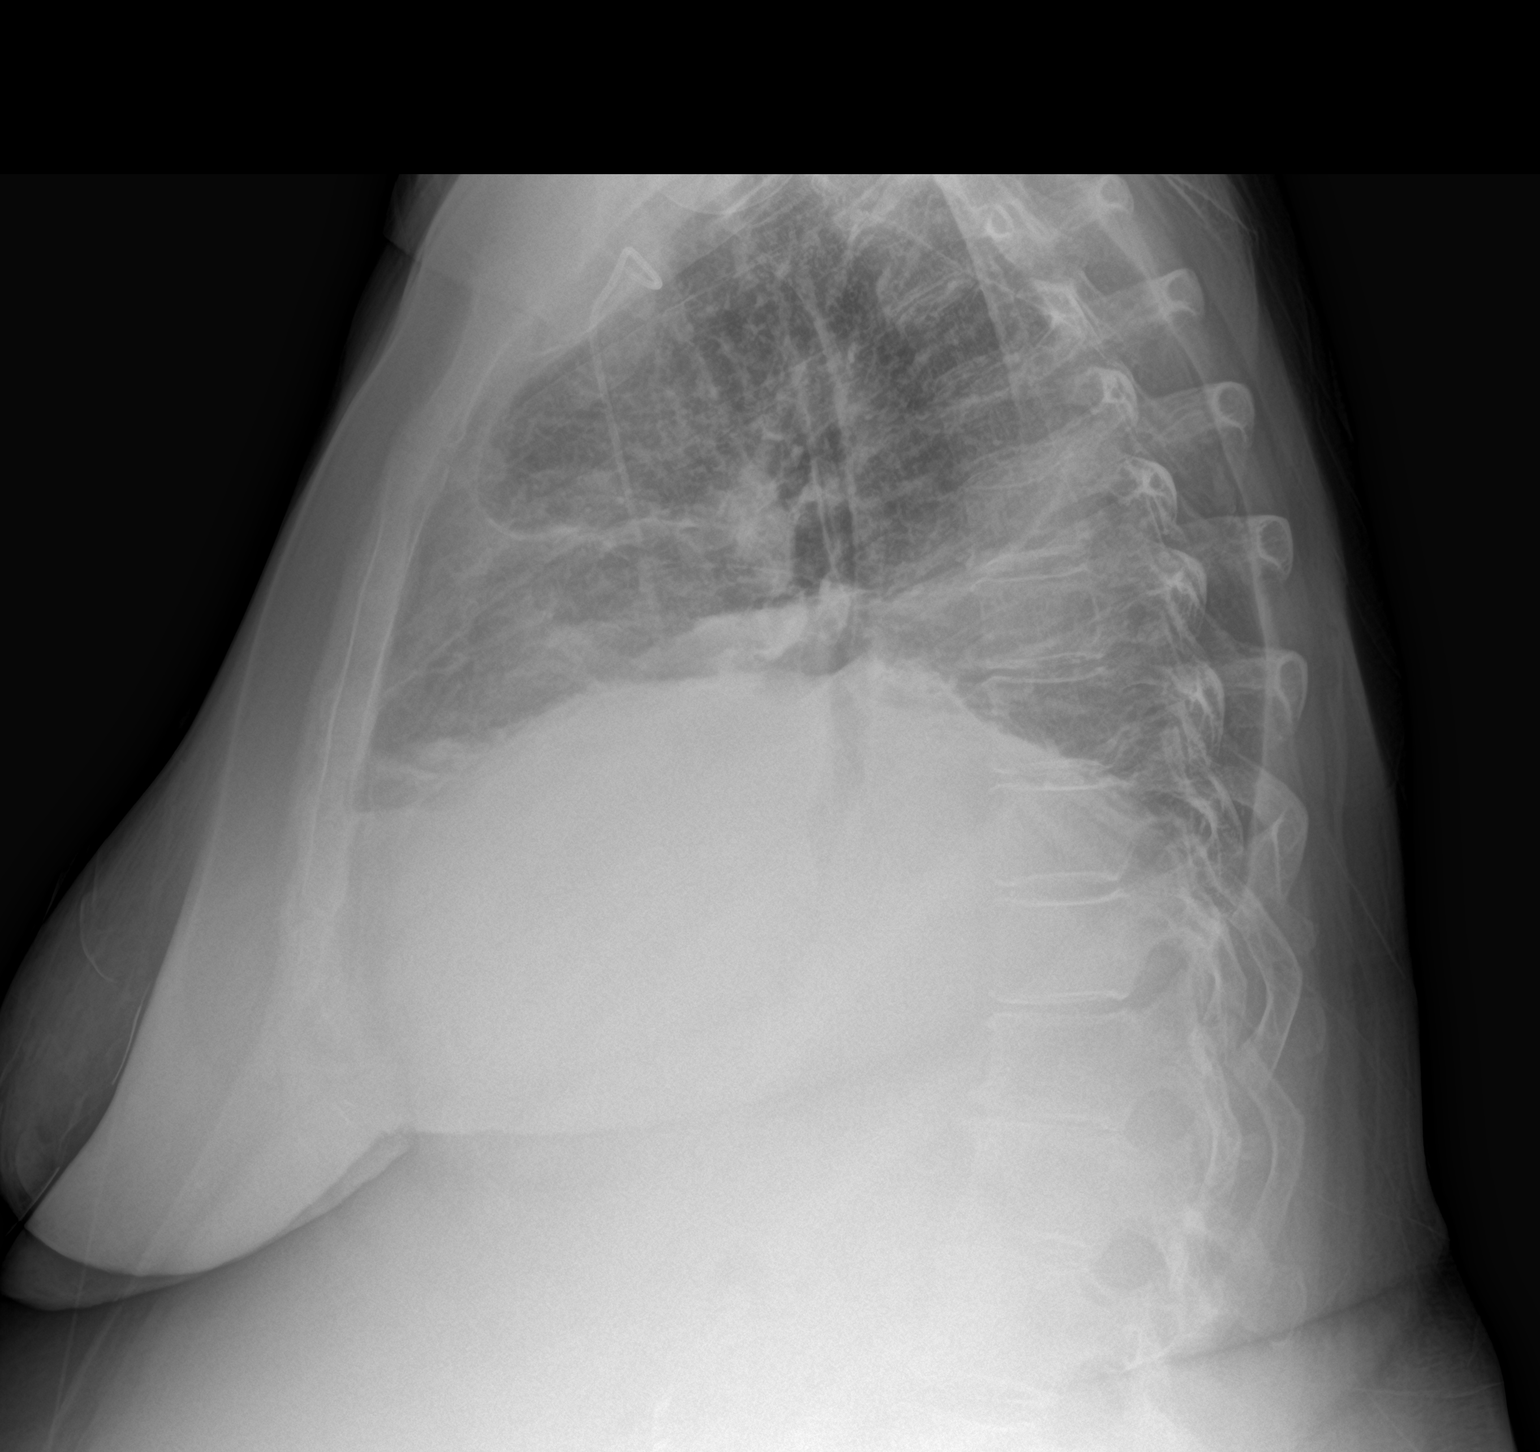

[2 of 2 positions shown; findings below may reference images not displayed]

FINDINGS: Transverse diameter of heart is increased. There is moderate left
pleural effusion. Evaluation of left lower lung fields for
infiltrates is limited by the effusion. There are small patchy
infiltrates in right lower lung fields which were not evident in the
previous study. There is no pneumothorax. There is blunting of right
lateral CP angle. Tip of PICC line is seen in the superior vena
cava.
IMPRESSION: Moderate left pleural effusion. Small right pleural effusion.
Possible atelectasis/pneumonia in the lower lung fields, more so on
the left side.

## 2021-11-12 MED ORDER — FUROSEMIDE 10 MG/ML IJ SOLN
40.0000 mg | Freq: Two times a day (BID) | INTRAMUSCULAR | Status: DC
  Administered 2021-11-12 – 2021-11-13 (×3): 40 mg via INTRAVENOUS
  Filled 2021-11-12 (×2): qty 4

## 2021-11-12 MED ORDER — SODIUM CHLORIDE 0.9 % IV SOLN
2.0000 g | INTRAVENOUS | Status: DC
  Administered 2021-11-12 – 2021-11-14 (×3): 2 g via INTRAVENOUS
  Filled 2021-11-12 (×3): qty 20

## 2021-11-12 MED ORDER — BENEPROTEIN PO POWD
1.0000 | Freq: Three times a day (TID) | ORAL | Status: DC
  Administered 2021-11-12 – 2021-11-13 (×2): 6 g via ORAL
  Filled 2021-11-12 (×3): qty 227

## 2021-11-12 MED ORDER — AZITHROMYCIN 250 MG PO TABS
500.0000 mg | ORAL_TABLET | Freq: Every day | ORAL | Status: AC
  Administered 2021-11-12 – 2021-11-14 (×3): 500 mg via ORAL
  Filled 2021-11-12 (×3): qty 2

## 2021-11-12 NOTE — Progress Notes (Addendum)
PROGRESS NOTE Pamela [name]  TKP:[id number] DOB: 1961-06-29 DOA: 11/03/2021 PCP: Marinda Elk, MD   Brief Narrative/Hospital Course: 61 year old female with diabetes hypertension hyperlipidemia hypothyroidism , chronic anemia with extensive stage small cell lung cancer diagnosed 2 weeks prior to admission presented with concern for tumor lysis syndrome AKI from oncology office referral.She had liver bx done 2/13 admitted for AKI/hyperuricemia tumor lysis risk. Seen by [facility] line placed for urgent chemo. She is maanged for elevated liver enzymes likely secondary to tumor burden.  Having ongoing pain go with fentanyl patch oxycodone.  Started on chemotherapy 2/20.  Patient had lower leg edema negative for DVT 11/04/21.  Oncology following started on chemotherapy and completed inpatient dose.  Patient's electrolytes were monitored along with LDH and uric acid LFTs due to risk of TLS.   Subjective: Seen and examined this morning.  Resting on bedside chair still complains of shortness of breath leg edema.  She voided with with Lasix yesterday Overnight afebrile, blood pressure stable Labs shows improving LFTs, uric acid 4.8  Assessment and Plan:  Extensive stage small cell lung cancer-diagnosed 2 weeks ago liver biopsy 2/13 Hyperuricemia concern/risk for tumor lysis syndrome:  MRI brain no intracranial mets but with 5 mm subdural hematoma/subdural hypodense lesion: diagnosed 2 weeks PTA S/P Liver biopsy 2/13.  Patient completed chemotherapy inpatient and is being monitored for TLS and any adverse reaction.  Uric acid remains stable LDH downtrending LFTs improving.  Continue with supportive care monitor closely,Continue pain control with fentanyl patch and oxycodone.  Elevated liver enzymes: LFTs improving in the setting of #1 W/ METS. Trend  C4 hypodense lesion Mid back pain Lt Post SDH vs Cancer mets: Seen in MRI.See #1 continue pain control supportive care. Cont pain management.  She does  not want to have another MRI  AKI W/ metabolic acidosis: Resolved  Hypocalcemia-S/P IV replacement.Corrected calcium ~ normal given hypoalbuminemia   B/L LE edema/hypervolemia/anasarca/shortness of breath Moderate left pleural effusion: Dopplers negative for DVT, BNP borderline 169.  Suspecting multifactorial with patient's hypoalbuminemia, fluid overload-discontinue IV fluids.  Got Lasix yesterday-we will start on twice daily IV Lasix as she is symptomatic with shortness of breath/pleural effusion, repeat chest x-ray-with similar finding.Pleural effusion could be due to volume or malignancy Filed Weights   11/09/21 0458 11/11/21 0437 11/12/21 0537  Weight: 110.1 kg 105.2 kg 104.8 kg    Hyponatremia: Suspect multifactorial- ?hypervolemia/ SIADH 2/2 lung cancer.  Sodium nicely improving.  Stop IV fluids, monitor Recent Labs  Lab 11/08/21 0530 11/09/21 0532 11/10/21 0500 11/11/21 0559 11/12/21 0500  NA 125* 128* 124* 128* 132*    Acquired hypothyroidism: cont Synthroid  Chronic anemia: Remains stable in 9 to 11 g.  Continue to monitor.   Recent Labs  Lab 11/08/21 0530 11/09/21 0532 11/10/21 0500 11/11/21 0559 11/12/21 0500  HGB 10.9* 10.7* 10.6* 9.8* 9.2*  HCT 34.1* 32.7* 32.4* 31.3* 29.4*    Leukocytosis resolved.   Recent Labs  Lab 11/08/21 0530 11/09/21 0532 11/10/21 0500 11/11/21 0559 11/12/21 0500  WBC 11.0* 10.7* 10.3 8.2 5.0    Essential hypertension: BP controlled  T2DM: Well-controlled sugar, continue to hold metformin, cbg and ssi to cont Recent Labs  Lab 11/11/21 0732 11/11/21 1139 11/11/21 1630 11/11/21 2017 11/12/21 0754  GLUCAP 135* 133* 125* 124* 140*   Lower leg edema with blisters in extremities : will diurse w/ routine wound care by nursing  Class II Obesity:Patient's Body mass index is 42.25 kg/m. : Will benefit with PCP follow-up, weight  loss  healthy lifestyle and outpatient sleep evaluation.  DVT prophylaxis: enoxaparin (LOVENOX)  injection 40 mg Start: 11/08/21 1000 Place TED hose Start: 11/05/21 0711 SCDs Start: 11/03/21 1431 Place TED hose Start: 11/03/21 1431 Code Status:   Code Status: Full Code Family Communication: plan of care discussed with patient/family at bedside.  Disposition: Currently not medically stable for discharge. Status is: Inpatient Remains inpatient appropriate because: Ongoing management of cancer and chemo-monitoring for TLS plan for home once LDH starts to trend down and cleared by hematology  Objective: Vitals last 24 hrs: Vitals:   11/11/21 1305 11/11/21 1950 11/11/21 1950 11/12/21 0537  BP:  (!) 122/46 (!) 122/46 (!) 121/59  Pulse:  81 85 80  Resp:  18 18 16   Temp: 97.7 F (36.5 C) 97.8 F (36.6 C) 97.8 F (36.6 C) (!) 97.5 F (36.4 C)  TempSrc: Oral Oral Oral Oral  SpO2:  (!) 89% (!) 88% 97%  Weight:    104.8 kg  Height:       Weight change: -0.454 kg  Physical Examination: General exam: AAOX3,older than stated age, weak appearing. HEENT:Oral mucosa moist, Ear/Nose WNL grossly, dentition normal. Respiratory system: Left posterior lung with diminished bs,no use of accessory muscle Cardiovascular system: S1 & S2 +, No JVD,. Gastrointestinal system: Abdomen soft,NT,ND, BS+ Nervous System:Alert, awake, moving extremities and grossly nonfocal Extremities: edema B/L le,distal peripheral pulses palpable.  Skin: No rashes,no icterus. MSK: Normal muscle bulk,tone, power  Medications reviewed: Scheduled Meds:  antiseptic oral rinse  15 mL Mouth Rinse Q6H   Chlorhexidine Gluconate Cloth  6 each Topical Daily   enoxaparin (LOVENOX) injection  40 mg Subcutaneous Q24H   feeding supplement  237 mL Oral BID BM   fentaNYL  1 patch Transdermal Q72H   furosemide  40 mg Intravenous BID   insulin aspart  0-9 Units Subcutaneous TID WC   levothyroxine  50 mcg Oral QAC breakfast   multivitamin with minerals  1 tablet Oral Daily   pantoprazole  40 mg Oral BID   polyethylene glycol  17  g Oral Daily   senna-docusate  1 tablet Oral BID   sodium chloride flush  10-40 mL Intracatheter Q12H  Continuous Infusions:  Diet Order             Diet regular Room service appropriate? Yes; Fluid consistency: Thin  Diet effective now                  Nutrition Problem: Increased nutrient needs Etiology: cancer and cancer related treatments Signs/Symptoms: estimated needs Interventions: Ensure Enlive (each supplement provides 350kcal and 20 grams of protein), MVI   Intake/Output Summary (Last 24 hours) at 11/12/2021 1047 Last data filed at 11/12/2021 0819 Gross per 24 hour  Intake 2114.46 ml  Output --  Net 2114.46 ml   Net IO Since Admission: 18,656.24 mL [11/12/21 1047]  Wt Readings from Last 3 Encounters:  11/12/21 104.8 kg  11/02/21 92.5 kg  10/29/21 91.2 kg     Unresulted Labs (From admission, onward)     Start     Ordered   11/13/21 0500  Comprehensive metabolic panel  Daily,   R     Question:  Specimen collection method  Answer:  Unit=Unit collect   11/12/21 0723   11/13/21 0500  Lactate dehydrogenase  Daily,   R     Question:  Specimen collection method  Answer:  Unit=Unit collect   11/12/21 0723   11/13/21 0500  Uric acid  Daily,  R     Question:  Specimen collection method  Answer:  Unit=Unit collect   11/12/21 0723   11/10/21 0500  CBC with Differential/Platelet  Daily,   R      11/09/21 0717          Data Reviewed: I have personally reviewed following labs and imaging studies CBC: Recent Labs  Lab 11/08/21 0530 11/09/21 0532 11/10/21 0500 11/11/21 0559 11/12/21 0500  WBC 11.0* 10.7* 10.3 8.2 5.0  NEUTROABS 10.5* 10.2* 9.7* 7.4 4.3  HGB 10.9* 10.7* 10.6* 9.8* 9.2*  HCT 34.1* 32.7* 32.4* 31.3* 29.4*  MCV 92.7 91.6 92.0 93.7 93.9  PLT 235 215 171 121* 478*   Basic Metabolic Panel: Recent Labs  Lab 11/08/21 0530 11/09/21 0532 11/10/21 0500 11/11/21 0559 11/12/21 0500  NA 125* 128* 124* 128* 132*  K 4.8 4.7 4.2 4.0 3.7  CL 88* 89*  87* 93* 98  CO2 27 29 27 26 28   GLUCOSE 129* 135* 135* 141* 135*  BUN 36* 33* 25* 19 16  CREATININE 1.05* 0.95 0.81 0.63 0.62  CALCIUM 6.5* 7.0* 7.3* 7.3* 7.8*   GFR: Estimated Creatinine Clearance: 83.9 mL/min (by C-G formula based on SCr of 0.62 mg/dL). Liver Function Tests: Recent Labs  Lab 11/08/21 0530 11/09/21 0532 11/10/21 0500 11/11/21 0559 11/12/21 0500  AST 599* 754* 913* 788* 596*  ALT 149* 141* 126* 102* 84*  ALKPHOS 445* 443* 432* 390* 347*  BILITOT 2.7* 2.6* 2.5* 2.8* 2.0*  PROT 6.2* 5.8* 6.0* 5.6* 5.6*  ALBUMIN 2.5* 2.5* 2.4* 2.4* 2.2*   No results for input(s): LIPASE, AMYLASE in the last 168 hours. No results for input(s): AMMONIA in the last 168 hours. Coagulation Profile: No results for input(s): INR, PROTIME in the last 168 hours. Cardiac Enzymes: No results for input(s): CKTOTAL, CKMB, CKMBINDEX, TROPONINI in the last 168 hours. BNP (last 3 results) No results for input(s): PROBNP in the last 8760 hours. HbA1C: No results for input(s): HGBA1C in the last 72 hours. CBG: Recent Labs  Lab 11/11/21 0732 11/11/21 1139 11/11/21 1630 11/11/21 2017 11/12/21 0754  GLUCAP 135* 133* 125* 124* 140*   Lipid Profile: No results for input(s): CHOL, HDL, LDLCALC, TRIG, CHOLHDL, LDLDIRECT in the last 72 hours. Thyroid Function Tests: No results for input(s): TSH, T4TOTAL, FREET4, T3FREE, THYROIDAB in the last 72 hours.  Anemia Panel: No results for input(s): VITAMINB12, FOLATE, FERRITIN, TIBC, IRON, RETICCTPCT in the last 72 hours. Sepsis Labs: No results for input(s): PROCALCITON, LATICACIDVEN in the last 168 hours.  Recent Results (from the past 240 hour(s))  Resp Panel by RT-PCR (Flu A&B, Covid) Nasopharyngeal Swab     Status: None   Collection Time: 11/03/21 11:50 AM   Specimen: Nasopharyngeal Swab; Nasopharyngeal(NP) swabs in vial transport medium  Result Value Ref Range Status   SARS Coronavirus 2 by RT PCR NEGATIVE NEGATIVE Final    Comment:  (NOTE) SARS-CoV-2 target nucleic acids are NOT DETECTED.  The SARS-CoV-2 RNA is generally detectable in upper respiratory specimens during the acute phase of infection. The lowest concentration of SARS-CoV-2 viral copies this assay can detect is 138 copies/mL. A negative result does not preclude SARS-Cov-2 infection and should not be used as the sole basis for treatment or other patient management decisions. A negative result may occur with  improper specimen collection/handling, submission of specimen other than nasopharyngeal swab, presence of viral mutation(s) within the areas targeted by this assay, and inadequate number of viral copies(<138 copies/mL). A negative result must be  combined with clinical observations, patient history, and epidemiological information. The expected result is Negative.  Fact Sheet for Patients:  EntrepreneurPulse.com.au  Fact Sheet for Healthcare Providers:  IncredibleEmployment.be  This test is no t yet approved or cleared by the [place] FDA and  has been authorized for detection and/or diagnosis of SARS-CoV-2 by FDA under an Emergency Use Authorization (EUA). This EUA will remain  in effect (meaning this test can be used) for the duration of the COVID-19 declaration under Section 564(b)(1) of the Act, 21 U.S.C.section 360bbb-3(b)(1), unless the authorization is terminated  or revoked sooner.       Influenza A by PCR NEGATIVE NEGATIVE Final   Influenza B by PCR NEGATIVE NEGATIVE Final    Comment: (NOTE) The Xpert Xpress SARS-CoV-2/FLU/RSV plus assay is intended as an aid in the diagnosis of influenza from Nasopharyngeal swab specimens and should not be used as a sole basis for treatment. Nasal washings and aspirates are unacceptable for Xpert Xpress SARS-CoV-2/FLU/RSV testing.  Fact Sheet for Patients: EntrepreneurPulse.com.au  Fact Sheet for Healthcare  Providers: IncredibleEmployment.be  This test is not yet approved or cleared by the [place] FDA and has been authorized for detection and/or diagnosis of SARS-CoV-2 by FDA under an Emergency Use Authorization (EUA). This EUA will remain in effect (meaning this test can be used) for the duration of the COVID-19 declaration under Section 564(b)(1) of the Act, 21 U.S.C. section 360bbb-3(b)(1), unless the authorization is terminated or revoked.  Performed at [facility], [place] [address] [place], [place] [address]    Antimicrobials: Anti-infectives (From admission, onward)    None     Culture/Microbiology No results found for: SDES, SPECREQUEST, CULT, REPTSTATUS  Other culture-see note   Radiology Studies: DG Chest 2 View  Result Date: 11/12/2021 CLINICAL DATA:  Pleural effusion, lung carcinoma EXAM: CHEST - 2 VIEW COMPARISON:  Previous studies including the examination of 11/11/2021 FINDINGS: Transverse diameter of heart is increased. There is moderate left pleural effusion. Evaluation of left lower lung fields for infiltrates is limited by the effusion. There are small patchy infiltrates in right lower lung fields which were not evident in the previous study. There is no pneumothorax. There is blunting of right lateral CP angle. Tip of PICC line is seen in the superior vena cava. IMPRESSION: Moderate left pleural effusion. Small right pleural effusion. Possible atelectasis/pneumonia in the lower lung fields, more so on the left side. Electronically Signed   By: Elmer Picker M.D.   On: 11/12/2021 10:31   DG Chest Port 1 View  Result Date: 11/11/2021 CLINICAL DATA:  Sudden shortness of breath beginning this morning. Patient began chemotherapy on 11/05/2021 for colon carcinoma. Also recent diagnosis of small cell lung carcinoma. EXAM: PORTABLE CHEST 1 VIEW COMPARISON:  11/19/2010.  CT, 10/18/2021 FINDINGS: There is opacity extending from the left  perihilar region to the left lung base, obscuring the left hemidiaphragm and portions of the left heart border, consistent with a combination of a moderate effusion and parenchymal lung opacity. The lung opacity is consistent with the known left lower lobe malignancy likely with associated atelectasis. Lung volumes are low. Minor opacity noted at the right lung base consistent with atelectasis. Remainder of the right lung is clear. No pneumothorax. Cardiac silhouette normal in size. Left-sided PICC is well positioned, tip in the lower superior vena cava. Skeletal structures are grossly intact. IMPRESSION: 1. Moderate left pleural effusion with associated left perihilar and lower lung opacity, the latter consistent with a combination  of the patient's known malignancy and probable atelectasis. The pleural effusion has significantly increased when compared to the prior CT scan from 10/18/2021. 2. No evidence of pulmonary edema. Electronically Signed   By: Lajean Manes M.D.   On: 11/11/2021 11:34     LOS: 9 days   Antonieta Pert, MD [facility]  11/12/2021, 10:47 AM

## 2021-11-12 NOTE — Progress Notes (Signed)
Pamela [name] is feeling little better this morning.  She has some issues with shortness of breath yesterday.  She had a chest x-ray yesterday.  It showed a moderate left pleural effusion.  This may be malignant or it may be from fluid.  She got diuresed.  I would repeat a chest x-ray today.  Her sodium and chloride are much better.  Her white cell count is 5.  Hemoglobin 9.2.  Platelet count 103,000.  Bilirubin is 2.  BUN is 16 creatinine 0.62.  AST is 596 ALT 84.  I think these are all getting better.  I will have to see what her LDH is.  The uric acid is 4.8.  She seems to be eating a little bit better.  She is having no problems with nausea or vomiting.  She is having no cough.  She is having no mouth sores.  There is no bleeding.  She has not had diarrhea.  Her vital signs show temperature 97.5.  Pulse 80.  Blood pressure 121/59.  Her oxygen saturation is 97%.  Her head neck exam shows no mucositis.  There is no scleral icterus.  Her lungs sound somewhat decreased over on the left side.  She had has no wheezing.  Cardiac exam regular rate and rhythm.  There are no murmurs.  Abdomen is soft, slightly obese.  There is some hepatomegaly.  Extremity shows some edema in the legs which is chronic.  Neurological exam is nonfocal.  Again, Pamela [name] has extensive stage small cell lung cancer.  She had her first cycle of chemotherapy last week.  Her white cell count and platelet count is slowly dropping.  I think her LFTs are improving.  The bilirubin is come down quite nicely.  Try to get her fluid balance can be challenging.  The left pleural effusion I suspect is probably from IV fluids.  I will check another chest x-ray on her today.  Again, we can just get her protein levels up, this will help with her fluid issues.  I would like to believe that she will be able to go home real soon.  I just think that we need to get the lab work a little better and make sure that everything is moving in the right  direction.  I will check another chest x-ray on her today.  I think that the staff on [address] are doing a fantastic job taking care of her.  [name], MD  [place] 6:1

## 2021-11-12 NOTE — TOC Progression Note (Signed)
Transition of Care [facility]) - Progression Note    Patient Details  Name: Pamela [name] MRN: [id number] Date of Birth: 02-24-1961  Transition of Care [facility] CM/SW Contact  Leeroy Cha, RN Phone Number: 11/12/2021, 9:01 AM  Clinical Narrative:    Following for toc needs      Barriers to Discharge: Continued Medical Work up  Expected Discharge Plan and Services                                                 Social Determinants of Health (SDOH) Interventions    Readmission Risk Interventions Readmission Risk Prevention Plan 11/06/2021  Transportation Screening Complete  PCP or Specialist Appt within 3-5 Days Complete  HRI or [place] Complete  Social Work Consult for [place] Planning/Counseling Complete  Palliative Care Screening Not Applicable  Medication Review [employer] photographer) Complete  Some recent data might be hidden

## 2021-11-12 NOTE — Progress Notes (Signed)
Nutrition Follow-up  INTERVENTION:   -Ensure Plus High Protein po BID, each supplement provides 350 kcal and 20 grams of protein.  -Multivitamin with minerals daily  NUTRITION DIAGNOSIS:   Increased nutrient needs related to cancer and cancer related treatments as evidenced by estimated needs.  Ongoing.  GOAL:   Patient will meet greater than or equal to 90% of their needs  Progressing.  MONITOR:   PO intake, Supplement acceptance, Labs, Weight trends, I & O's  ASSESSMENT:   61 y.o. female with medical history significant of T2DM, HTN, HLD, hypothyroidism and extensive small cell lung cancer diagnosed 2 weeks ago who came to ED after her oncologist drew labs on her and asked that she come to ER with concerns for tumor lysis syndrome, AKI.  Pt has not been drinking Ensure over the past 2 days.  Pt consumed 100% of her breakfast this morning.   Admission weight: 216 lbs Current weight: 231 lbs.  Medications: Multivitamin with minerals daily, Miralax, Senokot  Labs reviewed:  CBGs: 124-140 Low Na  Diet Order:   Diet Order             Diet regular Room service appropriate? Yes; Fluid consistency: Thin  Diet effective now                   EDUCATION NEEDS:   No education needs have been identified at this time  Skin:  Skin Assessment: Reviewed RN Assessment  Last BM:  2/27 -  Height:   Ht Readings from Last 1 Encounters:  11/08/21 5\' 2"  (1.575 m)    Weight:   Wt Readings from Last 1 Encounters:  11/12/21 104.8 kg    BMI:  Body mass index is 42.25 kg/m.  Estimated Nutritional Needs:   Kcal:  1900-2100  Protein:  85-100g  Fluid:  1.9L/day  Clayton Bibles, MS, RD, LDN Inpatient Clinical Dietitian Contact information available via Amion

## 2021-11-13 ENCOUNTER — Encounter: Payer: BC Managed Care – PPO | Admitting: Hospice and Palliative Medicine

## 2021-11-13 ENCOUNTER — Ambulatory Visit: Payer: BC Managed Care – PPO

## 2021-11-13 ENCOUNTER — Other Ambulatory Visit: Payer: BC Managed Care – PPO

## 2021-11-13 DIAGNOSIS — C3490 Malignant neoplasm of unspecified part of unspecified bronchus or lung: Secondary | ICD-10-CM | POA: Diagnosis not present

## 2021-11-13 LAB — COMPREHENSIVE METABOLIC PANEL
ALT: 69 U/L — ABNORMAL HIGH (ref 0–44)
AST: 386 U/L — ABNORMAL HIGH (ref 15–41)
Albumin: 2.2 g/dL — ABNORMAL LOW (ref 3.5–5.0)
Alkaline Phosphatase: 310 U/L — ABNORMAL HIGH (ref 38–126)
Anion gap: 7 (ref 5–15)
BUN: 17 mg/dL (ref 8–23)
CO2: 31 mmol/L (ref 22–32)
Calcium: 7.5 mg/dL — ABNORMAL LOW (ref 8.9–10.3)
Chloride: 94 mmol/L — ABNORMAL LOW (ref 98–111)
Creatinine, Ser: 0.66 mg/dL (ref 0.44–1.00)
GFR, Estimated: >60 mL/min (ref >60)
Glucose, Bld: 120 mg/dL — ABNORMAL HIGH (ref 70–99)
Potassium: 3.4 mmol/L — ABNORMAL LOW (ref 3.5–5.1)
Sodium: 132 mmol/L — ABNORMAL LOW (ref 135–145)
Total Bilirubin: 1.5 mg/dL — ABNORMAL HIGH (ref 0.3–1.2)
Total Protein: 5.6 g/dL — ABNORMAL LOW (ref 6.5–8.1)

## 2021-11-13 LAB — CBC WITH DIFFERENTIAL/PLATELET
Abs Immature Granulocytes: 0.05 10*3/uL (ref 0.00–0.07)
Basophils Absolute: 0 10*3/uL (ref 0.0–0.1)
Basophils Relative: 1 %
Eosinophils Absolute: 0 10*3/uL (ref 0.0–0.5)
Eosinophils Relative: 1 %
HCT: 27.6 % — ABNORMAL LOW (ref 36.0–46.0)
Hemoglobin: 8.7 g/dL — ABNORMAL LOW (ref 12.0–15.0)
Immature Granulocytes: 2 %
Lymphocytes Relative: 21 %
Lymphs Abs: 0.4 10*3/uL — ABNORMAL LOW (ref 0.7–4.0)
MCH: 29.4 pg (ref 26.0–34.0)
MCHC: 31.5 g/dL (ref 30.0–36.0)
MCV: 93.2 fL (ref 80.0–100.0)
Monocytes Absolute: 0 10*3/uL — ABNORMAL LOW (ref 0.1–1.0)
Monocytes Relative: 1 %
Neutro Abs: 1.6 10*3/uL — ABNORMAL LOW (ref 1.7–7.7)
Neutrophils Relative %: 74 %
Platelets: 79 10*3/uL — ABNORMAL LOW (ref 150–400)
RBC: 2.96 MIL/uL — ABNORMAL LOW (ref 3.87–5.11)
RDW: 15.3 % (ref 11.5–15.5)
WBC: 2.1 10*3/uL — ABNORMAL LOW (ref 4.0–10.5)
nRBC: 0 % (ref 0.0–0.2)

## 2021-11-13 LAB — ABO/RH: ABO/RH(D): O POS

## 2021-11-13 LAB — PROCALCITONIN: Procalcitonin: 0.69 ng/mL

## 2021-11-13 LAB — GLUCOSE, CAPILLARY
Glucose-Capillary: 123 mg/dL — ABNORMAL HIGH (ref 70–99)
Glucose-Capillary: 195 mg/dL — ABNORMAL HIGH (ref 70–99)
Glucose-Capillary: 192 mg/dL — ABNORMAL HIGH (ref 70–99)
Glucose-Capillary: 178 mg/dL — ABNORMAL HIGH (ref 70–99)

## 2021-11-13 LAB — URIC ACID: Uric Acid, Serum: 5.5 mg/dL (ref 2.5–7.1)

## 2021-11-13 LAB — PREPARE RBC (CROSSMATCH)

## 2021-11-13 LAB — LACTATE DEHYDROGENASE: LDH: 2521 U/L — ABNORMAL HIGH (ref 98–192)

## 2021-11-13 MED ORDER — FUROSEMIDE 10 MG/ML IJ SOLN: 40.0000 mg | Freq: Once | INTRAMUSCULAR | Status: DC

## 2021-11-13 MED ORDER — TBO-FILGRASTIM 480 MCG/0.8ML ~~LOC~~ SOSY
480.0000 ug | PREFILLED_SYRINGE | Freq: Every day | SUBCUTANEOUS | Status: DC
  Administered 2021-11-13 – 2021-11-17 (×5): 480 ug via SUBCUTANEOUS
  Filled 2021-11-13 (×5): qty 0.8

## 2021-11-13 MED ORDER — SODIUM CHLORIDE 0.9% IV SOLUTION: Freq: Once | INTRAVENOUS | Status: AC

## 2021-11-13 MED ORDER — FUROSEMIDE 10 MG/ML IJ SOLN
40.0000 mg | Freq: Once | INTRAMUSCULAR | Status: AC
  Administered 2021-11-13: 40 mg via INTRAVENOUS
  Filled 2021-11-13: qty 4

## 2021-11-13 MED ORDER — RESOURCE INSTANT PROTEIN PO PWD PACKET
6.0000 g | Freq: Three times a day (TID) | ORAL | Status: DC
  Administered 2021-11-13 – 2021-11-20 (×18): 6 g via ORAL
  Filled 2021-11-13 (×26): qty 6

## 2021-11-13 MED ORDER — FUROSEMIDE 10 MG/ML IJ SOLN
40.0000 mg | Freq: Two times a day (BID) | INTRAMUSCULAR | Status: DC
  Administered 2021-11-14 – 2021-11-21 (×14): 40 mg via INTRAVENOUS
  Filled 2021-11-13 (×15): qty 4

## 2021-11-13 NOTE — Progress Notes (Signed)
PROGRESS NOTE Pamela [name]  URK:[id number] DOB: [birth year] DOA: 11/03/2021 PCP: Marinda Elk, MD   Brief Narrative/Hospital Course: 61 year old female with diabetes hypertension hyperlipidemia hypothyroidism , chronic anemia with extensive stage small cell lung cancer diagnosed 2 weeks prior to admission presented with concern for tumor lysis syndrome AKI from oncology office referral.She had liver bx done 2/13 admitted for AKI/hyperuricemia tumor lysis risk. Seen by [facility] line placed for urgent chemo. She is maanged for elevated liver enzymes likely secondary to tumor burden.  Having ongoing pain go with fentanyl patch oxycodone.  Started on chemotherapy 2/20.  Patient had lower leg edema negative for DVT 11/04/21.  Oncology following started on chemotherapy and completed inpatient dose.  Patient's electrolytes were monitored along with LDH and uric acid LFTs due to risk of TLS.LFTs remain stable but now patient having pancytopenia 2/28-transfusion  1 u prbc.   Subjective: Seen and examined this morning.  Has blister on the right ankle bilateral lower extremity swollen erythematous warm and mild tender Overnight afebrile, on room air this morning.  Labs overall stable electrolytes renal function Lumin slowly uptrending 2.2 Pro-Cal 0.6 LDH uric acid downtrending  Assessment and Plan:  Extensive stage small cell lung cancer-diagnosed 2 weeks ago liver biopsy 2/13 Hyperuricemia concern/risk for tumor lysis syndrome:  MRI brain no intracranial mets but with 5 mm subdural hematoma/subdural hypodense lesion: diagnosed 2 weeks PTA S/P Liver biopsy 2/13.  Patient completed chemotherapy inpatient and is being monitored for TLS and any adverse reaction.LDH downtrending 3083> 2521, uric acid 4.8>55.5 LFTs improving and TB 1.5. Continue with supportive care,pain control with fentanyl patch and oxycodone.  Elevated liver enzymes:in the setting of #1 W/ METS. Trending down.  C4 hypodense lesion Mid  back pain Lt Post SDH vs Cancer mets: Seen in MRI.See #1 continue pain control supportive care. Cont pain management.  She does not want to have another MRI  B/L LE edema-with possible superimposed cellulitis/ankle blisters Hypervolemia Moderate left pleural effusion with associated compressive atelectasis-less likely pneumonia Pro-Cal 0.6: Dopplers negative for DVT, BNP borderline 169.  Patient had significant weight gain and fluid overload in the setting of hypoalbuminemia, chemo, hypervolemia.augmenting diet, on protein supplement, also restarting aggressive IV Lasix 40 mg twice daily -patient getting Lasix with transfusions so hold scheduled Lasix. Lower extremity right ankle having blisters and distally appear edematous/possible cellulitis-placed on IV antibiotics, wound care following.Dr. Marin Olp does not recommend thoracentesis for now.She is not hypoxic although has shortness of breath and orthopnea.  With diuresis her weight is now improving.  Continue to monitor intake output and daily weight, Filed Weights   11/11/21 0437 11/12/21 0537 11/13/21 0609  Weight: 105.2 kg 104.8 kg 98.9 kg   AKI W/ metabolic acidosis:Resolved  Hypocalcemia:S/P IV replacement. Corrected calcium~normal given hypoalbuminemia   Hyponatremia:As low as 124,remains a stable tolerating Lasix likely from hypervolemia/?SIADH with malignancy. Recent Labs  Lab 11/09/21 0532 11/10/21 0500 11/11/21 0559 11/12/21 0500 11/13/21 0506  NA 128* 124* 128* 132* 132*    Acquired hypothyroidism:on Synthroid  Pancytopenia with leukopenia anemia thrombocytopenia Anemia of may have chronic disease: Pancytopenia in the setting of chemo.  Hematology transfusing 2 units PRBC this morning along with iv Lasix x2 Recent Labs  Lab 11/11/21 0559 11/12/21 0500 11/13/21 0506  HGB 9.8* 9.2* 8.7*  HCT 31.3* 29.4* 27.6*  WBC 8.2 5.0 2.1*  PLT 121* 103* 79*    Essential hypertension: BP controlled  T2DM: Diabetes control on  SSI.  Continue to hold metformin.   Recent  Labs  Lab 11/12/21 0754 11/12/21 1210 11/12/21 1749 11/12/21 2143 11/13/21 0716  GLUCAP 140* 150* 142* 153* 123*   Class II Obesity:Patient's Body mass index is 39.87 kg/m. : Will benefit with PCP follow-up, weight loss  healthy lifestyle and outpatient sleep evaluation.  DVT prophylaxis: enoxaparin (LOVENOX) injection 40 mg Start: 11/08/21 1000 Place TED hose Start: 11/05/21 0711 SCDs Start: 11/03/21 1431 Place TED hose Start: 11/03/21 1431 Code Status:   Code Status: Full Code Family Communication: plan of care discussed with patient/family at bedside.  Disposition:Currently not medically stable for discharge. Status is: Inpatient Remains inpatient appropriate because: Ongoing management for monitoring for TLS, now with pancytopenia cellulitis.   Objective: Vitals last 24 hrs: Vitals:   11/13/21 0609 11/13/21 0800 11/13/21 1015 11/13/21 1045  BP: (!) 120/44  120/61 (!) 118/56  Pulse: 78  94 88  Resp: 17  18 18   Temp: (!) 97.4 F (36.3 C)  98.4 F (36.9 C) 97.8 F (36.6 C)  TempSrc: Oral  Oral Oral  SpO2: 94% 96% 94% 94%  Weight: 98.9 kg     Height:       Weight change: -5.897 kg  Physical Examination: General exam: AA0X3,older than stated age, weak appearing. HEENT:Oral mucosa moist, Ear/Nose WNL grossly, dentition normal. Respiratory system: bilaterally diminished basally.  No use of accessory muscle Cardiovascular system: S1 & S2 +, No JVD,. Gastrointestinal system: Abdomen soft,NT,ND, BS+ Nervous System:Alert, awake, moving extremities and grossly nonfocal Extremities: edema bilateral lower extremity with erythema tenderness, blister on the right ankle  Skin: No rashes,no icterus. MSK: Normal muscle bulk,tone, power  Medications reviewed: Scheduled Meds:  antiseptic oral rinse  15 mL Mouth Rinse Q6H   azithromycin  500 mg Oral QHS   Chlorhexidine Gluconate Cloth  6 each Topical Daily   enoxaparin (LOVENOX)  injection  40 mg Subcutaneous Q24H   feeding supplement  237 mL Oral BID BM   fentaNYL  1 patch Transdermal Q72H   [START ON 11/14/2021] furosemide  40 mg Intravenous BID   insulin aspart  0-9 Units Subcutaneous TID WC   levothyroxine  50 mcg Oral QAC breakfast   multivitamin with minerals  1 tablet Oral Daily   pantoprazole  40 mg Oral BID   polyethylene glycol  17 g Oral Daily   protein supplement  1 Scoop Oral TID WC   senna-docusate  1 tablet Oral BID   sodium chloride flush  10-40 mL Intracatheter Q12H   Tbo-Filgrastim  480 mcg Subcutaneous q1800  Continuous Infusions:  cefTRIAXone (ROCEPHIN)  IV Stopped (11/12/21 2236)     Diet Order             Diet regular Room service appropriate? Yes; Fluid consistency: Thin  Diet effective now                  Nutrition Problem: Increased nutrient needs Etiology: cancer and cancer related treatments Signs/Symptoms: estimated needs Interventions: Ensure Enlive (each supplement provides 350kcal and 20 grams of protein), MVI   Intake/Output Summary (Last 24 hours) at 11/13/2021 1107 Last data filed at 11/13/2021 0800 Gross per 24 hour  Intake 1330 ml  Output 300 ml  Net 1030 ml   Net IO Since Admission: 41,740.81 mL [11/13/21 1107]  Wt Readings from Last 3 Encounters:  11/13/21 98.9 kg  11/02/21 92.5 kg  10/29/21 91.2 kg     Unresulted Labs (From admission, onward)     Start     Ordered   11/14/21 0500  CBC with Differential/Platelet  Daily,   R     Question:  Specimen collection method  Answer:  Unit=Unit collect   11/13/21 0713   11/14/21 0500  Comprehensive metabolic panel  Daily,   R     Question:  Specimen collection method  Answer:  Unit=Unit collect   11/13/21 0713   11/13/21 0500  Lactate dehydrogenase  Daily,   R     Question:  Specimen collection method  Answer:  Unit=Unit collect   11/12/21 0723   11/13/21 0500  Uric acid  Daily,   R     Question:  Specimen collection method  Answer:  Unit=Unit collect    11/12/21 0723   11/13/21 0500  Procalcitonin  Daily,   R     Question:  Specimen collection method  Answer:  Unit=Unit collect   11/12/21 1735          Data Reviewed: I have personally reviewed following labs and imaging studies CBC: Recent Labs  Lab 11/09/21 0532 11/10/21 0500 11/11/21 0559 11/12/21 0500 11/13/21 0506  WBC 10.7* 10.3 8.2 5.0 2.1*  NEUTROABS 10.2* 9.7* 7.4 4.3 1.6*  HGB 10.7* 10.6* 9.8* 9.2* 8.7*  HCT 32.7* 32.4* 31.3* 29.4* 27.6*  MCV 91.6 92.0 93.7 93.9 93.2  PLT 215 171 121* 103* 79*   Basic Metabolic Panel: Recent Labs  Lab 11/09/21 0532 11/10/21 0500 11/11/21 0559 11/12/21 0500 11/13/21 0506  NA 128* 124* 128* 132* 132*  K 4.7 4.2 4.0 3.7 3.4*  CL 89* 87* 93* 98 94*  CO2 29 27 26 28 31   GLUCOSE 135* 135* 141* 135* 120*  BUN 33* 25* 19 16 17   CREATININE 0.95 0.81 0.63 0.62 0.66  CALCIUM 7.0* 7.3* 7.3* 7.8* 7.5*   GFR: Estimated Creatinine Clearance: 81.1 mL/min (by C-G formula based on SCr of 0.66 mg/dL). Liver Function Tests: Recent Labs  Lab 11/09/21 0532 11/10/21 0500 11/11/21 0559 11/12/21 0500 11/13/21 0506  AST 754* 913* 788* 596* 386*  ALT 141* 126* 102* 84* 69*  ALKPHOS 443* 432* 390* 347* 310*  BILITOT 2.6* 2.5* 2.8* 2.0* 1.5*  PROT 5.8* 6.0* 5.6* 5.6* 5.6*  ALBUMIN 2.5* 2.4* 2.4* 2.2* 2.2*   No results for input(s): LIPASE, AMYLASE in the last 168 hours. No results for input(s): AMMONIA in the last 168 hours. Coagulation Profile: No results for input(s): INR, PROTIME in the last 168 hours. Cardiac Enzymes: No results for input(s): CKTOTAL, CKMB, CKMBINDEX, TROPONINI in the last 168 hours. BNP (last 3 results) No results for input(s): PROBNP in the last 8760 hours. HbA1C: No results for input(s): HGBA1C in the last 72 hours. CBG: Recent Labs  Lab 11/12/21 0754 11/12/21 1210 11/12/21 1749 11/12/21 2143 11/13/21 0716  GLUCAP 140* 150* 142* 153* 123*   Lipid Profile: No results for input(s): CHOL, HDL, LDLCALC,  TRIG, CHOLHDL, LDLDIRECT in the last 72 hours. Thyroid Function Tests: No results for input(s): TSH, T4TOTAL, FREET4, T3FREE, THYROIDAB in the last 72 hours.  Anemia Panel: No results for input(s): VITAMINB12, FOLATE, FERRITIN, TIBC, IRON, RETICCTPCT in the last 72 hours. Sepsis Labs: Recent Labs  Lab 11/12/21 1735 11/13/21 0506  PROCALCITON 0.61 0.69    Recent Results (from the past 240 hour(s))  Resp Panel by RT-PCR (Flu A&B, Covid) Nasopharyngeal Swab     Status: None   Collection Time: 11/03/21 11:50 AM   Specimen: Nasopharyngeal Swab; Nasopharyngeal(NP) swabs in vial transport medium  Result Value Ref Range Status   SARS Coronavirus 2 by RT PCR  NEGATIVE NEGATIVE Final    Comment: (NOTE) SARS-CoV-2 target nucleic acids are NOT DETECTED.  The SARS-CoV-2 RNA is generally detectable in upper respiratory specimens during the acute phase of infection. The lowest concentration of SARS-CoV-2 viral copies this assay can detect is 138 copies/mL. A negative result does not preclude SARS-Cov-2 infection and should not be used as the sole basis for treatment or other patient management decisions. A negative result may occur with  improper specimen collection/handling, submission of specimen other than nasopharyngeal swab, presence of viral mutation(s) within the areas targeted by this assay, and inadequate number of viral copies(<138 copies/mL). A negative result must be combined with clinical observations, patient history, and epidemiological information. The expected result is Negative.  Fact Sheet for Patients:  EntrepreneurPulse.com.au  Fact Sheet for Healthcare Providers:  IncredibleEmployment.be  This test is no t yet approved or cleared by the [place] FDA and  has been authorized for detection and/or diagnosis of SARS-CoV-2 by FDA under an Emergency Use Authorization (EUA). This EUA will remain  in effect (meaning this test can be  used) for the duration of the COVID-19 declaration under Section 564(b)(1) of the Act, 21 U.S.C.section 360bbb-3(b)(1), unless the authorization is terminated  or revoked sooner.       Influenza A by PCR NEGATIVE NEGATIVE Final   Influenza B by PCR NEGATIVE NEGATIVE Final    Comment: (NOTE) The Xpert Xpress SARS-CoV-2/FLU/RSV plus assay is intended as an aid in the diagnosis of influenza from Nasopharyngeal swab specimens and should not be used as a sole basis for treatment. Nasal washings and aspirates are unacceptable for Xpert Xpress SARS-CoV-2/FLU/RSV testing.  Fact Sheet for Patients: EntrepreneurPulse.com.au  Fact Sheet for Healthcare Providers: IncredibleEmployment.be  This test is not yet approved or cleared by the [place] FDA and has been authorized for detection and/or diagnosis of SARS-CoV-2 by FDA under an Emergency Use Authorization (EUA). This EUA will remain in effect (meaning this test can be used) for the duration of the COVID-19 declaration under Section 564(b)(1) of the Act, 21 U.S.C. section 360bbb-3(b)(1), unless the authorization is terminated or revoked.  Performed at [facility], [place] [address] [place], [place] [address]    Antimicrobials: Anti-infectives (From admission, onward)    Start     Dose/Rate Route Frequency Ordered Stop   11/12/21 2100  cefTRIAXone (ROCEPHIN) 2 g in sodium chloride 0.9 % 100 mL IVPB        2 g 200 mL/hr over 30 Minutes Intravenous Every 24 hours 11/12/21 1958     11/12/21 2100  azithromycin (ZITHROMAX) tablet 500 mg        500 mg Oral Daily at bedtime 11/12/21 1958 11/15/21 2159     Culture/Microbiology No results found for: SDES, SPECREQUEST, CULT, REPTSTATUS  Other culture-see note   Radiology Studies: DG Chest 2 View  Result Date: 11/12/2021 CLINICAL DATA:  Pleural effusion, lung carcinoma EXAM: CHEST - 2 VIEW COMPARISON:  Previous studies including the  examination of 11/11/2021 FINDINGS: Transverse diameter of heart is increased. There is moderate left pleural effusion. Evaluation of left lower lung fields for infiltrates is limited by the effusion. There are small patchy infiltrates in right lower lung fields which were not evident in the previous study. There is no pneumothorax. There is blunting of right lateral CP angle. Tip of PICC line is seen in the superior vena cava. IMPRESSION: Moderate left pleural effusion. Small right pleural effusion. Possible atelectasis/pneumonia in the lower lung fields, more so on  the left side. Electronically Signed   By: Elmer Picker M.D.   On: 11/12/2021 10:31   DG Chest Port 1 View  Result Date: 11/11/2021 CLINICAL DATA:  Sudden shortness of breath beginning this morning. Patient began chemotherapy on 11/05/2021 for colon carcinoma. Also recent diagnosis of small cell lung carcinoma. EXAM: PORTABLE CHEST 1 VIEW COMPARISON:  11/19/2010.  CT, 10/18/2021 FINDINGS: There is opacity extending from the left perihilar region to the left lung base, obscuring the left hemidiaphragm and portions of the left heart border, consistent with a combination of a moderate effusion and parenchymal lung opacity. The lung opacity is consistent with the known left lower lobe malignancy likely with associated atelectasis. Lung volumes are low. Minor opacity noted at the right lung base consistent with atelectasis. Remainder of the right lung is clear. No pneumothorax. Cardiac silhouette normal in size. Left-sided PICC is well positioned, tip in the lower superior vena cava. Skeletal structures are grossly intact. IMPRESSION: 1. Moderate left pleural effusion with associated left perihilar and lower lung opacity, the latter consistent with a combination of the patient's known malignancy and probable atelectasis. The pleural effusion has significantly increased when compared to the prior CT scan from 10/18/2021. 2. No evidence of pulmonary  edema. Electronically Signed   By: Lajean Manes M.D.   On: 11/11/2021 11:34     LOS: 10 days   Antonieta Pert, MD [facility]  11/13/2021, 11:07 AM

## 2021-11-13 NOTE — Progress Notes (Signed)
Nutrition Follow-up  INTERVENTION:   -Protein shakes being provided from home Will leave Ensure order  in case not available from family  -Beneprotein powder TID with meals, each packet provides 25 kcals and 6g protein  -Multivitamin with minerals daily  NUTRITION DIAGNOSIS:   Increased nutrient needs related to cancer and cancer related treatments as evidenced by estimated needs.  Ongoing.  GOAL:   Patient will meet greater than or equal to 90% of their needs  Progressing.  MONITOR:   PO intake, Supplement acceptance, Labs, Weight trends, I & O's  REASON FOR ASSESSMENT:   Consult Assessment of nutrition requirement/status  ASSESSMENT:   61 y.o. female with medical history significant of T2DM, HTN, HLD, hypothyroidism and extensive small cell lung cancer diagnosed 2 weeks ago who came to ED after her oncologist drew labs on her and asked that she come to ER with concerns for tumor lysis syndrome, AKI.  Patient in room, daughter in law at bedside. Pt reports eating a bit better, she is trying to eat as best as she can. Pt did eat eggs, sausage, english muffin and applesauce (~800 kcals, 29g protein). Her family has continued to bring her supplements from home. Today she is going to drink a Fairlife shake (150 kcals, 30g protein) and she has Atkins protein bars as well (180 kcals, 7g protein). Pt plans to eat chicken for dinner tonight.  Beneprotein has been ordered for additional protein as well. Pt is accepting these.  Admission weight: 216 lbs Current weight: 218 lbs  Medications: Lasix, Multivitamin with minerals daily, senokot  Labs reviewed:  CGBs: 123-195 Low Na, K   Diet Order:   Diet Order             Diet regular Room service appropriate? Yes; Fluid consistency: Thin  Diet effective now                   EDUCATION NEEDS:   No education needs have been identified at this time  Skin:  Skin Assessment: Skin Integrity Issues: Skin Integrity  Issues:: Other (Comment) Other: ankle blister  Last BM:  2/28 -type 6  Height:   Ht Readings from Last 1 Encounters:  11/08/21 5\' 2"  (1.575 m)    Weight:   Wt Readings from Last 1 Encounters:  11/13/21 98.9 kg    BMI:  Body mass index is 39.87 kg/m.  Estimated Nutritional Needs:   Kcal:  1900-2100  Protein:  85-100g  Fluid:  1.9L/day  Clayton Bibles, MS, RD, LDN Inpatient Clinical Dietitian Contact information available via [employer]

## 2021-11-13 NOTE — Progress Notes (Signed)
Unfortunately, we are now seeing the effects of the chemotherapy on her blood.  Her white cell count is down to 2.2.  Hemoglobin is 8.7.  Platelet count 79,000.  She is having a lot more swelling in the legs.  She has erythema in the legs.  This might be venous stasis although cellulitis could certainly be a possibility.  She does have some blisters forming.  I still think a lot of the problem is her low protein.  Her hepatomegaly with cancer and also is preventing a lot of fluid from out of coming out of her legs.  I do think that she would benefit from a blood transfusion.  I think her hemoglobin will keep going down.  I think transfusion will help with the leg swelling.  I think we get the blood in the heart, we can then give Lasix to try to get the fluid out of her legs.  I talked to her about a blood transfusion.  Her daughter-in-law was with her.  I explained how the transfusion is done.  I explained that transfusion was safe.  I said it would take about 3 or 4 hours for each unit to go in.  Her liver tests are improving nicely now.  The LDH is down to 2500.  Bilirubin is down to 1.5.  Her alkaline phosphatase is down to 310.  SGPT is 69 SGOT 386.  As such, the chemotherapy is starting to clean out her liver.  She will need Neupogen.  She is on antibiotics right now for the leg swelling.  Again, I think we can get her liver to work better and decrease the tumor burden in the liver, her legs will improve.  Her albumin is only 2.2.  This is also an issue.  Once we get the albumin to get above 3, again, I think that the legs will improve.  She does not complain of any shortness of breath.  She does have some fluid in the lungs.  She is using the incentive spirometer.  Her appetite is okay.  She has had no nausea or vomiting.  There is no mouth sores.  She is having no diarrhea.  Her vital signs show temperature 97.4.  Pulse 78.  Blood pressure 120/44.  Her weight is 218 pounds.  Her lungs  sound pretty clear bilaterally.  There may be some wheezes at the bases.  Cardiac exam regular rate and rhythm.  Abdomen is soft.  She is mildly obese.  Bowel sounds are present.  Her liver is still just below the right costal margin.  Extremities shows 3+ edema in the legs.  She has erythema in the lower pretibial areas.  She has bullous formation.  Again, it does not surprise me that things are happening with respect to her blood counts.  Is now been about a week since she started treatment.  Again, if we can just get her protein/albumin better, get her hepatomegaly improved, her legs will get better.  She will get blood today.  We will start her on Neupogen today.  She is on antibiotics right now which she is appropriate.  [name], MD  Psalms 6:2

## 2021-11-13 NOTE — Consult Note (Addendum)
WOC Nurse Consult Note: Reason for Consult:Right ankle blister Wound type:Venous insufficiency vs neoplasm related Pressure Injury POA: N/A Measurement:To be obtained by Bedside RN today prior to performing wound care and documented on Nursing Flow Sheet Wound bed: serum filled blister is intact, no wound bed Drainage (amount, consistency, odor) None. Small serous from other lesions Periwound:edematous Dressing procedure/placement/frequency: I have provided guidance for Nursing for the twice daily care of this lesion using an antimicrobial nonadherent gauze (xeroform) topped with an ABD pad for comfort and absorption and securement with a Kerlix roll gauze appplied from just below toes to just below knees/paper tape. The Kerlix is to be topped with an ACE bandage applied in a similar manner to support venous return. A sacral prophylactic foam dressing is to be applied for PI prevention and the feet placed into Prevalon boots while in bed.  I discussed the POC with the patient's Bedside RN A. Brown via [employer].  [place] nursing team will not follow, but will remain available to this patient, the nursing and medical teams.  Please re-consult if needed. Thanks, Maudie Flakes, MSN, RN, [place], Arther Abbott  Pager# [phone]

## 2021-11-14 ENCOUNTER — Inpatient Hospital Stay (HOSPITAL_COMMUNITY): Payer: BC Managed Care – PPO

## 2021-11-14 DIAGNOSIS — R600 Localized edema: Secondary | ICD-10-CM | POA: Insufficient documentation

## 2021-11-14 DIAGNOSIS — D61818 Other pancytopenia: Secondary | ICD-10-CM

## 2021-11-14 DIAGNOSIS — E871 Hypo-osmolality and hyponatremia: Secondary | ICD-10-CM

## 2021-11-14 DIAGNOSIS — E8729 Other acidosis: Secondary | ICD-10-CM

## 2021-11-14 DIAGNOSIS — E878 Other disorders of electrolyte and fluid balance, not elsewhere classified: Secondary | ICD-10-CM

## 2021-11-14 DIAGNOSIS — L03119 Cellulitis of unspecified part of limb: Secondary | ICD-10-CM

## 2021-11-14 DIAGNOSIS — C3490 Malignant neoplasm of unspecified part of unspecified bronchus or lung: Secondary | ICD-10-CM | POA: Diagnosis not present

## 2021-11-14 DIAGNOSIS — J189 Pneumonia, unspecified organism: Secondary | ICD-10-CM

## 2021-11-14 LAB — COMPREHENSIVE METABOLIC PANEL
ALT: 59 U/L — ABNORMAL HIGH (ref 0–44)
AST: 242 U/L — ABNORMAL HIGH (ref 15–41)
Albumin: 2.4 g/dL — ABNORMAL LOW (ref 3.5–5.0)
Alkaline Phosphatase: 278 U/L — ABNORMAL HIGH (ref 38–126)
Anion gap: 9 (ref 5–15)
BUN: 21 mg/dL (ref 8–23)
CO2: 31 mmol/L (ref 22–32)
Calcium: 7.6 mg/dL — ABNORMAL LOW (ref 8.9–10.3)
Chloride: 94 mmol/L — ABNORMAL LOW (ref 98–111)
Creatinine, Ser: 0.59 mg/dL (ref 0.44–1.00)
GFR, Estimated: >60 mL/min (ref >60)
Glucose, Bld: 128 mg/dL — ABNORMAL HIGH (ref 70–99)
Potassium: 3.2 mmol/L — ABNORMAL LOW (ref 3.5–5.1)
Sodium: 134 mmol/L — ABNORMAL LOW (ref 135–145)
Total Bilirubin: 1.5 mg/dL — ABNORMAL HIGH (ref 0.3–1.2)
Total Protein: 6 g/dL — ABNORMAL LOW (ref 6.5–8.1)

## 2021-11-14 LAB — CBC WITH DIFFERENTIAL/PLATELET
Abs Immature Granulocytes: 0.02 10*3/uL (ref 0.00–0.07)
Basophils Absolute: 0 10*3/uL (ref 0.0–0.1)
Basophils Relative: 2 %
Eosinophils Absolute: 0 10*3/uL (ref 0.0–0.5)
Eosinophils Relative: 0 %
HCT: 35.7 % — ABNORMAL LOW (ref 36.0–46.0)
Hemoglobin: 11.7 g/dL — ABNORMAL LOW (ref 12.0–15.0)
Immature Granulocytes: 2 %
Lymphocytes Relative: 28 %
Lymphs Abs: 0.3 10*3/uL — ABNORMAL LOW (ref 0.7–4.0)
MCH: 30 pg (ref 26.0–34.0)
MCHC: 32.8 g/dL (ref 30.0–36.0)
MCV: 91.5 fL (ref 80.0–100.0)
Monocytes Absolute: 0 10*3/uL — ABNORMAL LOW (ref 0.1–1.0)
Monocytes Relative: 1 %
Neutro Abs: 0.8 10*3/uL — ABNORMAL LOW (ref 1.7–7.7)
Neutrophils Relative %: 67 %
Platelets: 46 10*3/uL — ABNORMAL LOW (ref 150–400)
RBC: 3.9 MIL/uL (ref 3.87–5.11)
RDW: 14.8 % (ref 11.5–15.5)
WBC: 1.2 10*3/uL — CL (ref 4.0–10.5)
nRBC: 0 % (ref 0.0–0.2)

## 2021-11-14 LAB — BPAM RBC
Blood Product Expiration Date: 202303292359
Blood Product Expiration Date: 202303302359
ISSUE DATE / TIME: 202302281017
ISSUE DATE / TIME: 202302281321
Unit Type and Rh: 5100
Unit Type and Rh: 5100

## 2021-11-14 LAB — TYPE AND SCREEN
ABO/RH(D): O POS
Antibody Screen: NEGATIVE
Unit division: 0
Unit division: 0

## 2021-11-14 LAB — MAGNESIUM: Magnesium: 1.4 mg/dL — ABNORMAL LOW (ref 1.7–2.4)

## 2021-11-14 LAB — LACTATE DEHYDROGENASE: LDH: 2005 U/L — ABNORMAL HIGH (ref 98–192)

## 2021-11-14 LAB — GLUCOSE, CAPILLARY
Glucose-Capillary: 126 mg/dL — ABNORMAL HIGH (ref 70–99)
Glucose-Capillary: 161 mg/dL — ABNORMAL HIGH (ref 70–99)
Glucose-Capillary: 200 mg/dL — ABNORMAL HIGH (ref 70–99)

## 2021-11-14 LAB — URIC ACID: Uric Acid, Serum: 5.3 mg/dL (ref 2.5–7.1)

## 2021-11-14 LAB — PROCALCITONIN: Procalcitonin: 0.52 ng/mL

## 2021-11-14 IMAGING — DX DG CHEST 2V
2 series · 2 of 2 positions shown · non-contrast
Comparison: Chest x-ray dated [DATE].

CLINICAL DATA: Shortness of breath.  History of lung cancer.

EXAM:
CHEST - 2 VIEW

[chest pa]
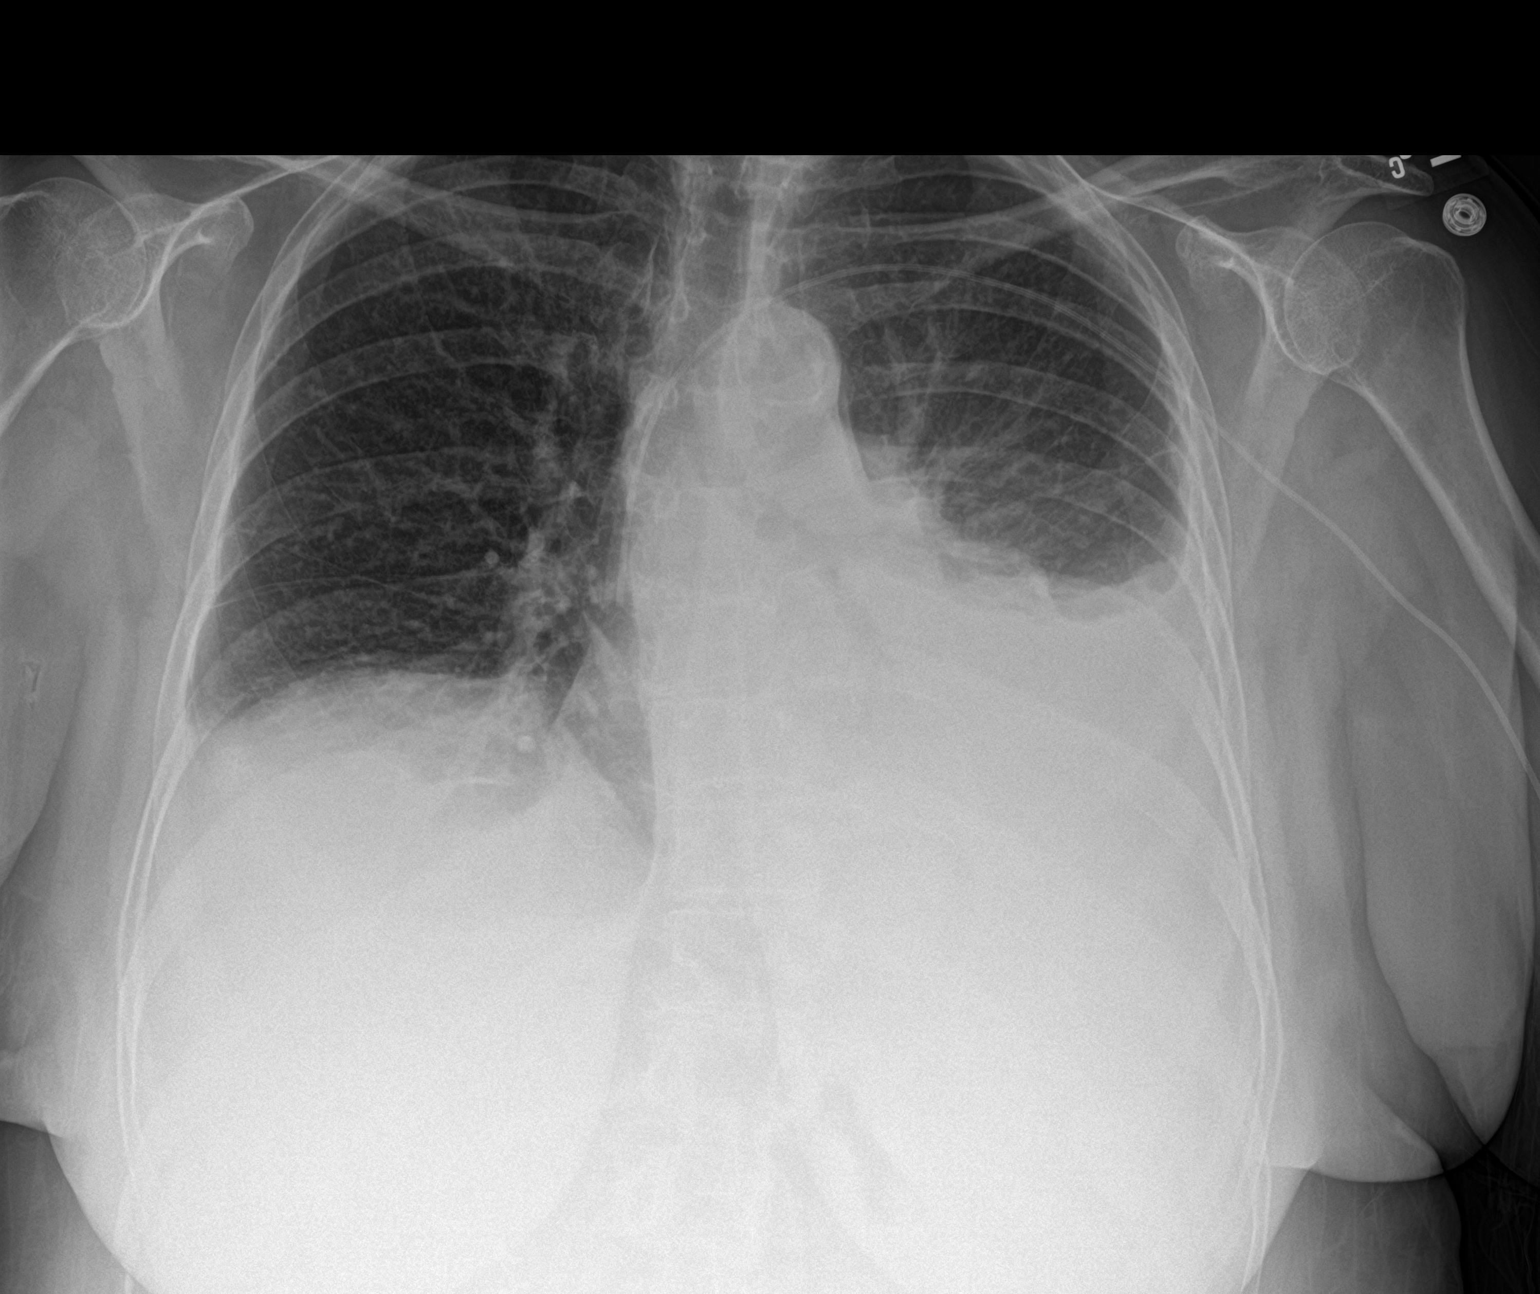

[chest lat]
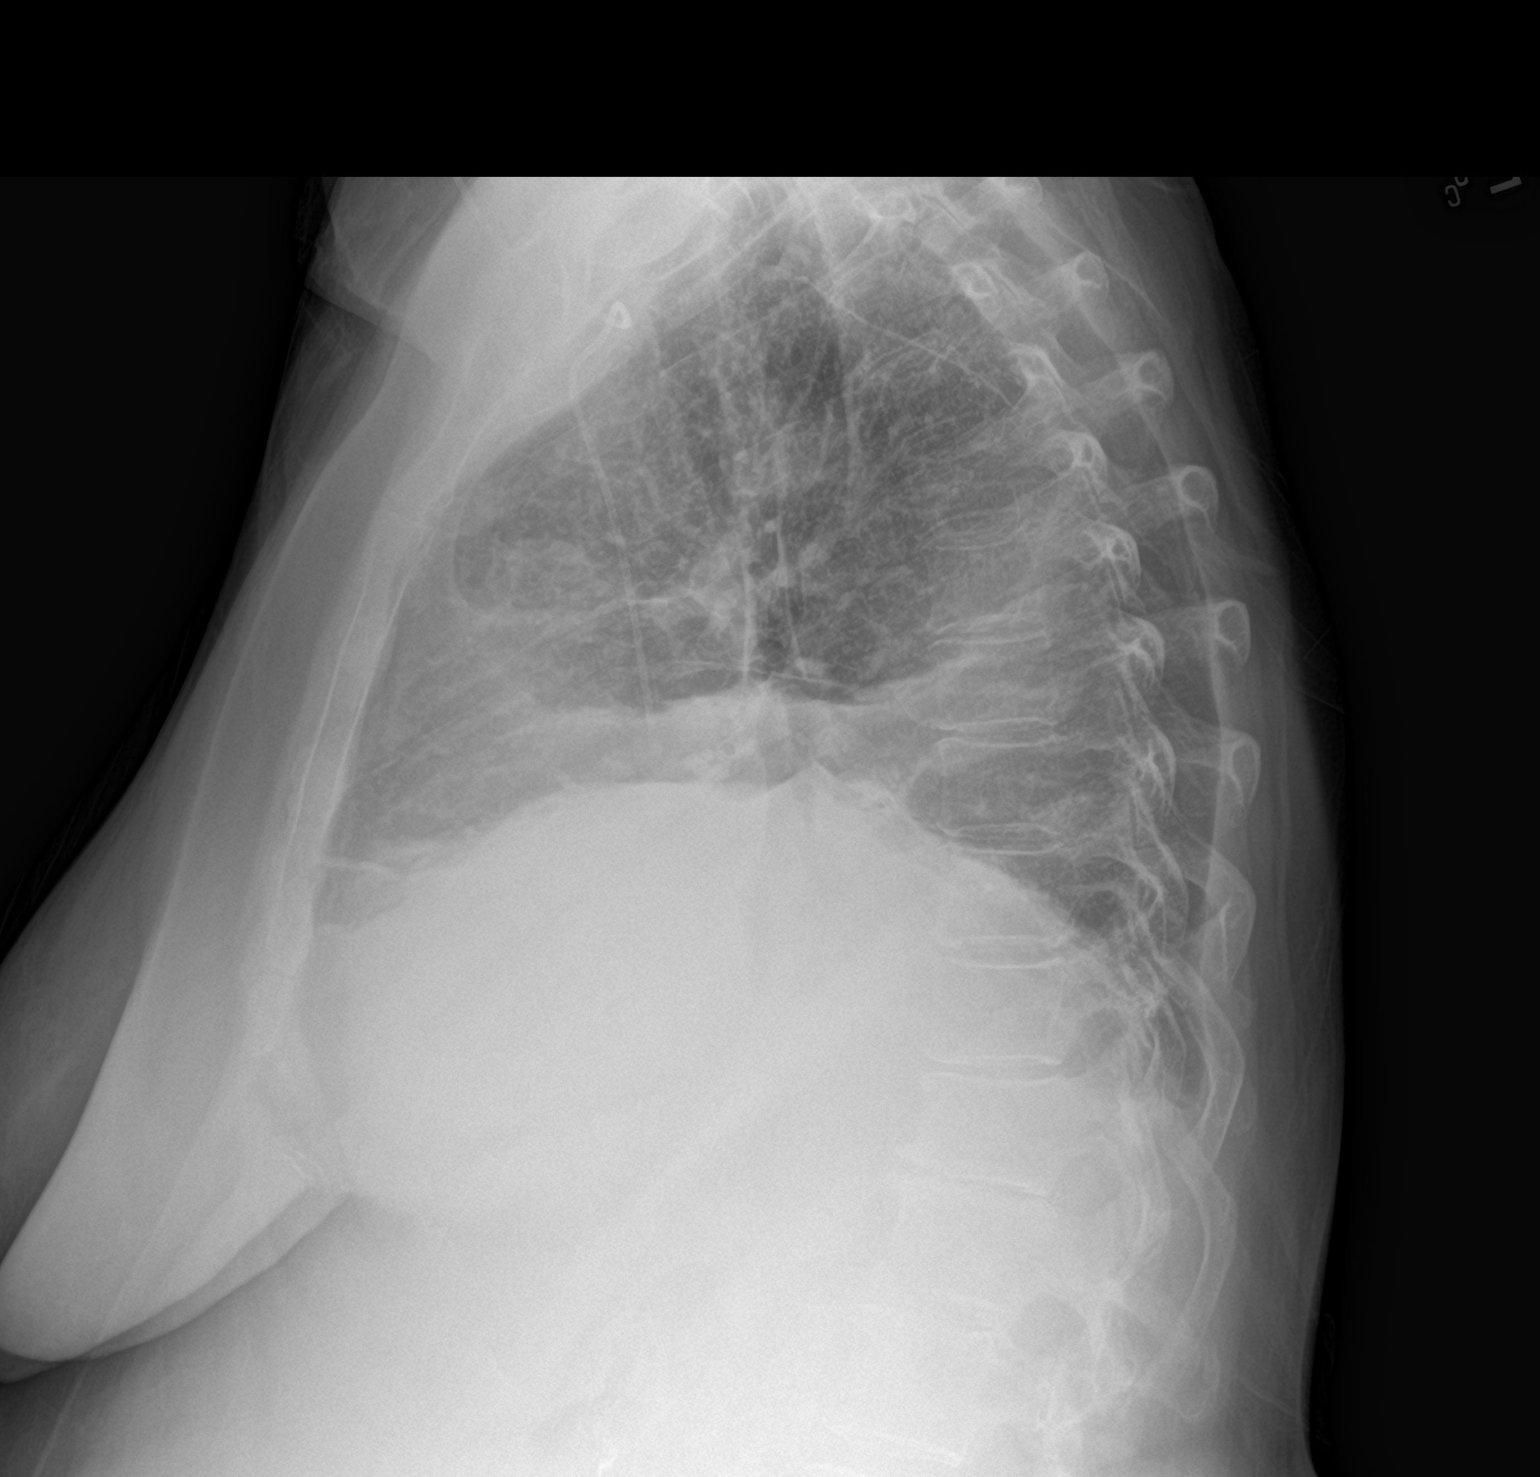

[2 of 2 positions shown; findings below may reference images not displayed]

FINDINGS: Unchanged left upper extremity PICC line with tip at the cavoatrial
junction. The heart size and mediastinal contours are within normal
limits. Unchanged small to moderate left pleural effusion with left
lower lobe collapse/consolidation. Unchanged trace right pleural
effusion and right basilar atelectasis. No pneumothorax. No acute
osseous abnormality.
IMPRESSION: 1. Unchanged bilateral pleural effusions and left lower lobe
collapse/consolidation.

## 2021-11-14 MED ORDER — ALBUMIN HUMAN 25 % IV SOLN
50.0000 g | Freq: Once | INTRAVENOUS | Status: AC
  Administered 2021-11-14: 50 g via INTRAVENOUS
  Filled 2021-11-14: qty 200

## 2021-11-14 MED ORDER — MAGNESIUM SULFATE 4 GM/100ML IV SOLN
4.0000 g | Freq: Once | INTRAVENOUS | Status: AC
Start: 2021-11-14 — End: 2021-11-14
  Administered 2021-11-14: 4 g via INTRAVENOUS
  Filled 2021-11-14: qty 100

## 2021-11-14 MED ORDER — FUROSEMIDE 10 MG/ML IJ SOLN
60.0000 mg | Freq: Once | INTRAMUSCULAR | Status: AC
  Administered 2021-11-14: 60 mg via INTRAVENOUS
  Filled 2021-11-14: qty 6

## 2021-11-14 MED ORDER — POTASSIUM CHLORIDE CRYS ER 20 MEQ PO TBCR
40.0000 meq | EXTENDED_RELEASE_TABLET | ORAL | Status: AC
  Administered 2021-11-14: 40 meq via ORAL
  Filled 2021-11-14: qty 2

## 2021-11-14 MED ORDER — TBO-FILGRASTIM 480 MCG/0.8ML ~~LOC~~ SOSY: 480.0000 ug | PREFILLED_SYRINGE | Freq: Every day | SUBCUTANEOUS | Status: DC

## 2021-11-14 MED ORDER — POTASSIUM CHLORIDE 10 MEQ/50ML IV SOLN
10.0000 meq | INTRAVENOUS | Status: AC
  Administered 2021-11-14 (×4): 10 meq via INTRAVENOUS
  Filled 2021-11-14 (×4): qty 50

## 2021-11-14 NOTE — Assessment & Plan Note (Addendum)
Likely due to chemo.  Received 2 units of blood on 2/28.  Platelet nadir at 8 on 3/4 and received platelet transfusion.  Leukopenia resolved.  Hgb stable.  Thrombocytopenia improved. ?-Granix discontinued on 3/5. ?-Resume subcu Lovenox for VTE prophylaxis now thrombocytopenia improved ?-Continue monitoring ? ?

## 2021-11-14 NOTE — Progress Notes (Signed)
Pamela [name] did well with the blood transfusion yesterday.  Her hemoglobin went up to 11.7.  I am surprised that went up so well.  I am not surprised by the fact that her white cell count is now dropping.  Her white cell count is 1.2.  She is going to be neutropenic.  We have her on Neupogen.  She is thrombocytopenic.  I would still keep her on the low-dose Lovenox just because of her risk of thromboembolic disease.  Patient still has a lot of fluid in the legs.  She has more erythema.  Is hard to say if this is cellulitis or just venous stasis. ? ?She has her LFTs improving incredibly well.  Her LDH is now down to 2000.  Bilirubin is 1.5.  Her SGPT is 59 SGOT to 42.  Alkaline phosphatase is 278. ? ?Her calcium is low but yet corrected is almost up to normal. ? ?We clearly have a ways to go.  We did do another chest x-ray on her today.  She still has some edema and congestion, mostly in the left lung base.  I do not know if this might be where her primary malignancy is. ? ?She we will get some albumin today followed by Lasix to see if we can get some more fluid out of her legs.  I realize that some of the leg edema is from the hepatic infiltration by cancer.  I think we can clear out the liver, that her legs will get a whole lot better. ? ?She is eating a little bit better.  She is trying to get a more protein. ? ?Her potassium is little bit low at 3.2.  We will go ahead and give her some supplemental potassium. ? ?I am not surprised by her blood counts dropping.  Again, with her decreased liver function, we had to make an adjustment with her etoposide dose.  However, it still will lead to neutropenia. ? ?She is still afebrile.  She is on IV antibiotics with azithromycin and Rocephin.  We may have to broaden the coverage of antibiotics depend on what her white blood cell count does.  I may want to think about using Primaxin. ? ?On her exam, her vital signs show temperature of 98.4.  Pulse 75.  Blood pressure 107/61.   Oxygen saturation is 92%.  Her lungs sound pretty clear.  I do not hear any wheezing.  She has good air movement bilaterally.  Oral exam does not show any mucositis.  Cardiac exam is regular rate and rhythm with no murmurs, rubs or bruits.  Abdomen is soft.  Bowel sounds are present.  There is still some hepatomegaly.  Extremity shows 3+ edema in her legs bilaterally.  She has marked erythema in the ankles and pretibial area.  Neurological exam is nonfocal. ? ?Pamela [name] has extensive stage small cell lung cancer.  She has had a first cycle of chemotherapy.  She is on day #9 of treatment.  She is becoming neutropenic.  She is on Neupogen.  She is on antibiotics.  We may have to adjust these. ? ?We will have to keep working on trying to get fluid out of her legs.  Again, a lot of this has to do with her hepatic infiltration by cancer.  I am glad is to see that her liver tests are improving nicely. ? ?I still do not see that she is going to be able to go home right now.  I still feel that  we have to get her legs better and get some of that erythema out of her legs before we could consider getting her home. ? ?I do appreciate the incredible care that she is getting from all the staff up on 6 E. ? ?[name], MD ? ?Lamentations 3:22-23 ? ? ? ? ? ?

## 2021-11-14 NOTE — Assessment & Plan Note (Addendum)
K3.2.  Mg 1.9. ?-P.o. KCl 40x2 ?-Recheck in the morning ?

## 2021-11-14 NOTE — Progress Notes (Signed)
PROGRESS NOTE  Pamela [name] HYQ:[id number] DOB: 1961/01/07   PCP: Marinda Elk, MD  Patient is from: Home  DOA: 11/03/2021 LOS: 23  Chief complaints:  Chief Complaint  Patient presents with   abnormal labs     Brief Narrative / Interim history: 61 year old F with recent diagnosis of small cell lung cancer with mets who started chemo on 2/20, DM-2, HTN, HLD and hypothyroidism admitted from oncology office with AKI and concern for TLS.  She completed first cycle of chemotherapy.  She developed pancytopenia.  Started on Granix.  Oncology following.  Monitoring labs including LFT and LDH due to risk for TLS.  There is also concern about possible pneumonia and BLE cellulitis with significant edema.  Started on CTX, Zithromax and Lasix.    Subjective: Seen and examined earlier this morning.  No major events overnight of this morning.  Feels very winded walking out of the bathroom to bedside chair.  Still with BLE swelling and erythema.  No other complaints.  Daughter-in-law at bedside.  Objective: Vitals:   11/13/21 1611 11/13/21 2036 11/14/21 0430 11/14/21 1522  BP: (!) 125/57 (!) 126/51 129/60 107/61  Pulse: 90 88 83 75  Resp: 18 18 16 16   Temp: 97.7 F (36.5 C) 97.6 F (36.4 C) 98.1 F (36.7 C) 98.4 F (36.9 C)  TempSrc: Oral Oral Oral Oral  SpO2: 94% 91% 96% 92%  Weight:      Height:        Examination:  GENERAL: No apparent distress.  Nontoxic. HEENT: MMM.  Vision and hearing grossly intact.  NECK: Supple.  No apparent JVD.  RESP:  No IWOB.  Fair aeration bilaterally. CVS:  RRR. Heart sounds normal.  ABD/GI/GU: BS+. Abd soft, NTND.  MSK/EXT:  Moves extremities.  Significant BLE edema with erythema.  Slightly warm to touch. SKIN: BLE erythema above mid shin.  Slightly warm to touch. NEURO: Awake, alert and oriented appropriately.  No apparent focal neuro deficit. PSYCH: Calm. Normal affect.   Procedures:  None  Microbiology summarized: [id number] and  influenza PCR nonreactive.  Assessment and Plan: * Small cell lung cancer ([place])- (present on admission) Diagnosed 2 weeks PTA S/P Liver biopsy 2/13.  Completed chemo inpatient being monitored for TLS and any adverse reaction.   -Continue pain control with fentanyl patch and oxycodone.  -Cont plan per Onco- likely needs Port once LUE PICC removed. -Oncology following  Hyperuricemia concern/risk for tumor lysis syndrome  Continue monitoring labs.  LDH trended down to 2000.  Uric acid 5.3.  Renal function stable.   Elevated liver enzymes Due to malignancy?  Improved. -Continue monitoring   AKI (acute kidney injury) ([place]) Recent Labs    11/05/21 0537 11/06/21 0525 11/07/21 0832 11/08/21 0530 11/09/21 0532 11/10/21 0500 11/11/21 0559 11/12/21 0500 11/13/21 0506 11/14/21 0600  BUN 49* 40* 37* 36* 33* 25* 19 16 17 21   CREATININE 1.32* 1.09* 1.11* 1.05* 0.95 0.81 0.63 0.62 0.66 0.59  -Continue monitoring while on diuretics   Pancytopenia ([facility]) Likely due to chemo.  Received 2 units of blood on 2/28.  Hgb improved. -Continue Granix per oncology -Continue monitoring   Pneumonia Ruled out.  CXR finding likely atelectasis.  Procalcitonin might not be reliable in this patient after chemo. -Already completed course of Zithromax -On ceftriaxone mainly for possible cellulitis.  Cellulitis of lower extremity Difficult diagnosis to make given edema/possible venous insufficiency.  Edema could be from malignancy as well.  Albumin 2.4. -Started on CTX and IV Lasix -Continue  monitoring -Encourage leg elevation. -Check BNP -Consider echocardiogram  Hypokalemia and hypomagnesemia K3.2.  Mg 1.4. -P.o. 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  Stable. Continue monitoring  Metabolic acidosis, increased anion gap Likely secondary to AKI.  Resolved.  Type 2 diabetes mellitus without complication, without long-term current  use of insulin ([facility]) A1c 6.8% in 10/2021. Recent Labs  Lab 11/13/21 1143 11/13/21 1631 11/13/21 2117 11/14/21 0733 11/14/21 1211  GLUCAP 195* 192* 178* 126* 161*  -Continue current insulin regimen -Continue holding metformin  Essential hypertension- (present on admission) Normotensive without antihypertensive meds. -Continue monitoring  Chronic anemia- (present on admission) Recent Labs    11/05/21 0537 11/06/21 0525 11/07/21 0832 11/08/21 0530 11/09/21 0532 11/10/21 0500 11/11/21 0559 11/12/21 0500 11/13/21 0506 11/14/21 0600  HGB 11.0* 11.1* 11.0* 10.9* 10.7* 10.6* 9.8* 9.2* 8.7* 11.7*  See pancytopenia   Acquired hypothyroidism- (present on admission) Continue home Synthroid   Increased nutrient needs Body mass index is 39.87 kg/m. Nutrition Problem: Increased nutrient needs Etiology: cancer and cancer related treatments Signs/Symptoms: estimated needs Interventions: Ensure Enlive (each supplement provides 350kcal and 20 grams of protein), MVI   DVT prophylaxis:  enoxaparin (LOVENOX) injection 40 mg Start: 11/08/21 1000 Place TED hose Start: 11/05/21 0711 SCDs Start: 11/03/21 1431 Place TED hose Start: 11/03/21 1431  Code Status: Full code Family Communication: Updated patient's daughter-in-law at bedside. Level of care: Med-Surg Status is: Inpatient Remains inpatient appropriate because: Pancytopenia and significant BLE edema with possible cellulitis     Final disposition: Likely home once medically stable.      Consultants:  Oncology   Sch Meds:  Scheduled Meds:  antiseptic oral rinse  15 mL Mouth Rinse Q6H   azithromycin  500 mg Oral QHS   Chlorhexidine Gluconate Cloth  6 each Topical Daily   enoxaparin (LOVENOX) injection  40 mg Subcutaneous Q24H   feeding supplement  237 mL Oral BID BM   fentaNYL  1 patch Transdermal Q72H   furosemide  40 mg Intravenous BID   insulin aspart  0-9 Units Subcutaneous TID WC   levothyroxine  50 mcg  Oral QAC breakfast   multivitamin with minerals  1 tablet Oral Daily   pantoprazole  40 mg Oral BID   polyethylene glycol  17 g Oral Daily   potassium chloride  40 mEq Oral Q4H   protein supplement  6 g Oral TID WC   senna-docusate  1 tablet Oral BID   sodium chloride flush  10-40 mL Intracatheter Q12H   Tbo-Filgrastim  480 mcg Subcutaneous q1800   Continuous Infusions:  cefTRIAXone (ROCEPHIN)  IV Stopped (11/13/21 2143)   magnesium sulfate bolus IVPB 4 g (11/14/21 1458)   PRN Meds:.acetaminophen, albuterol, alum & mag hydroxide-simeth, heparin lock flush, lidocaine, LORazepam, ondansetron, oxyCODONE, prochlorperazine, senna-docusate, sodium chloride flush, temazepam  Antimicrobials: Anti-infectives (From admission, onward)    Start     Dose/Rate Route Frequency Ordered Stop   11/12/21 2100  cefTRIAXone (ROCEPHIN) 2 g in sodium chloride 0.9 % 100 mL IVPB        2 g 200 mL/hr over 30 Minutes Intravenous Every 24 hours 11/12/21 1958     11/12/21 2100  azithromycin (ZITHROMAX) tablet 500 mg        500 mg Oral Daily at bedtime 11/12/21 1958 11/15/21 2159        I have personally reviewed the following labs and images: CBC: Recent Labs  Lab 11/10/21 0500 11/11/21  0559 11/12/21 0500 11/13/21 0506 11/14/21 0600  WBC 10.3 8.2 5.0 2.1* 1.2*  NEUTROABS 9.7* 7.4 4.3 1.6* 0.8*  HGB 10.6* 9.8* 9.2* 8.7* 11.7*  HCT 32.4* 31.3* 29.4* 27.6* 35.7*  MCV 92.0 93.7 93.9 93.2 91.5  PLT 171 121* 103* 79* 46*   BMP &GFR Recent Labs  Lab 11/10/21 0500 11/11/21 0559 11/12/21 0500 11/13/21 0506 11/14/21 0600 11/14/21 0908  NA 124* 128* 132* 132* 134*  --   K 4.2 4.0 3.7 3.4* 3.2*  --   CL 87* 93* 98 94* 94*  --   CO2 27 26 28 31 31   --   GLUCOSE 135* 141* 135* 120* 128*  --   BUN 25* 19 16 17 21   --   CREATININE 0.81 0.63 0.62 0.66 0.59  --   CALCIUM 7.3* 7.3* 7.8* 7.5* 7.6*  --   MG  --   --   --   --   --  1.4*   Estimated Creatinine Clearance: 81.1 mL/min (by C-G formula  based on SCr of 0.59 mg/dL). Liver & Pancreas: Recent Labs  Lab 11/10/21 0500 11/11/21 0559 11/12/21 0500 11/13/21 0506 11/14/21 0600  AST 913* 788* 596* 386* 242*  ALT 126* 102* 84* 69* 59*  ALKPHOS 432* 390* 347* 310* 278*  BILITOT 2.5* 2.8* 2.0* 1.5* 1.5*  PROT 6.0* 5.6* 5.6* 5.6* 6.0*  ALBUMIN 2.4* 2.4* 2.2* 2.2* 2.4*   No results for input(s): LIPASE, AMYLASE in the last 168 hours. No results for input(s): AMMONIA in the last 168 hours. Diabetic: No results for input(s): HGBA1C in the last 72 hours. Recent Labs  Lab 11/13/21 1143 11/13/21 1631 11/13/21 2117 11/14/21 0733 11/14/21 1211  GLUCAP 195* 192* 178* 126* 161*   Cardiac Enzymes: No results for input(s): CKTOTAL, CKMB, CKMBINDEX, TROPONINI in the last 168 hours. No results for input(s): PROBNP in the last 8760 hours. Coagulation Profile: No results for input(s): INR, PROTIME in the last 168 hours. Thyroid Function Tests: No results for input(s): TSH, T4TOTAL, FREET4, T3FREE, THYROIDAB in the last 72 hours. Lipid Profile: No results for input(s): CHOL, HDL, LDLCALC, TRIG, CHOLHDL, LDLDIRECT in the last 72 hours. Anemia Panel: No results for input(s): VITAMINB12, FOLATE, FERRITIN, TIBC, IRON, RETICCTPCT in the last 72 hours. Urine analysis:    Component Value Date/Time   COLORURINE AMBER (A) 11/03/2021 2255   APPEARANCEUR HAZY (A) 11/03/2021 2255   LABSPEC 1.014 11/03/2021 2255   PHURINE 5.0 11/03/2021 2255   GLUCOSEU NEGATIVE 11/03/2021 2255   HGBUR NEGATIVE 11/03/2021 2255   BILIRUBINUR NEGATIVE 11/03/2021 2255   KETONESUR 5 (A) 11/03/2021 2255   PROTEINUR NEGATIVE 11/03/2021 2255   NITRITE NEGATIVE 11/03/2021 2255   LEUKOCYTESUR NEGATIVE 11/03/2021 2255   Sepsis Labs: Invalid input(s): PROCALCITONIN, [place]  Microbiology: No results found for this or any previous visit (from the past 240 hour(s)).  Radiology Studies: DG Chest 2 View  Result Date: 11/14/2021 CLINICAL DATA:  Shortness of  breath.  History of lung cancer. EXAM: CHEST - 2 VIEW COMPARISON:  Chest x-ray dated November 12, 2021. FINDINGS: Unchanged left upper extremity PICC line with tip at the cavoatrial junction. The heart size and mediastinal contours are within normal limits. Unchanged small to moderate left pleural effusion with left lower lobe collapse/consolidation. Unchanged trace right pleural effusion and right basilar atelectasis. No pneumothorax. No acute osseous abnormality. IMPRESSION: 1. Unchanged bilateral pleural effusions and left lower lobe collapse/consolidation. Electronically Signed   By: Titus Dubin M.D.   On: 11/14/2021  08:[name]  If 7PM-7AM, please contact night-coverage [email or web address] 11/14/2021, 4:37 PM

## 2021-11-14 NOTE — Assessment & Plan Note (Addendum)
Ruled out.  CXR finding likely atelectasis.  Procalcitonin might not be reliable in this patient after chemo.  Regardless, received broad-spectrum antibiotics and 3 days of Zithromax.  CXR with stable left-sided pleural effusion likely from malignancy. ?

## 2021-11-14 NOTE — Assessment & Plan Note (Addendum)
Cellulitis ruled out.  Concern about adverse drug reaction, underlying venous insufficiency and poor venous return from liver metastasis although the later should have improved after chem.   TSH elevated to 22 but free T4 within normal.  Albumin is okay.  Leg pain and erythema seems to have improved after wrapping legs. ?-CTX 2/27>> meropenem 3/2-3/5 per oncology.  Vancomycin on 3/5 ?-ID-monitor off antibiotics, consider punch biopsy, steroid or transfer to tertiary center ?-Appreciate help and guidance by [facility] dressing/wrapping ?-Leg pain and erythema improved after leg wrapping. ?-Continue IV Lasix while in-house.  May need p.o. diuretics on discharge ?-TOC to help with outpatient wound care referral in [facility] ?-Encourage leg elevation. ?-Fluid restriction, strict I&O and daily weight.   ?-Likely discharge home on 3/8 per oncology ?

## 2021-11-15 ENCOUNTER — Inpatient Hospital Stay (HOSPITAL_COMMUNITY): Payer: BC Managed Care – PPO

## 2021-11-15 ENCOUNTER — Other Ambulatory Visit: Payer: BC Managed Care – PPO

## 2021-11-15 ENCOUNTER — Ambulatory Visit: Payer: BC Managed Care – PPO

## 2021-11-15 DIAGNOSIS — R0601 Orthopnea: Secondary | ICD-10-CM

## 2021-11-15 DIAGNOSIS — R5381 Other malaise: Secondary | ICD-10-CM

## 2021-11-15 DIAGNOSIS — C3490 Malignant neoplasm of unspecified part of unspecified bronchus or lung: Secondary | ICD-10-CM | POA: Diagnosis not present

## 2021-11-15 LAB — T4, FREE: Free T4: 0.85 ng/dL (ref 0.61–1.12)

## 2021-11-15 LAB — COMPREHENSIVE METABOLIC PANEL
ALT: 47 U/L — ABNORMAL HIGH (ref 0–44)
AST: 136 U/L — ABNORMAL HIGH (ref 15–41)
Albumin: 2.9 g/dL — ABNORMAL LOW (ref 3.5–5.0)
Alkaline Phosphatase: 248 U/L — ABNORMAL HIGH (ref 38–126)
Anion gap: 7 (ref 5–15)
BUN: 21 mg/dL (ref 8–23)
CO2: 32 mmol/L (ref 22–32)
Calcium: 7.8 mg/dL — ABNORMAL LOW (ref 8.9–10.3)
Chloride: 95 mmol/L — ABNORMAL LOW (ref 98–111)
Creatinine, Ser: 0.58 mg/dL (ref 0.44–1.00)
GFR, Estimated: >60 mL/min (ref >60)
Glucose, Bld: 124 mg/dL — ABNORMAL HIGH (ref 70–99)
Potassium: 3.6 mmol/L (ref 3.5–5.1)
Sodium: 134 mmol/L — ABNORMAL LOW (ref 135–145)
Total Bilirubin: 1.3 mg/dL — ABNORMAL HIGH (ref 0.3–1.2)
Total Protein: 6.4 g/dL — ABNORMAL LOW (ref 6.5–8.1)

## 2021-11-15 LAB — ECHOCARDIOGRAM COMPLETE
AR max vel: 2.85 cm2
AV Area VTI: 3.2 cm2
AV Area mean vel: 2.85 cm2
AV Mean grad: 6 mmHg
AV Peak grad: 9.7 mmHg
Ao pk vel: 1.56 m/s
Area-P 1/2: 6.71 cm2
Calc EF: 60.8 %
Height: 62 in
S' Lateral: 2.9 cm
Single Plane A2C EF: 61.2 %
Single Plane A4C EF: 63 %
Weight: 3488 oz

## 2021-11-15 LAB — GLUCOSE, CAPILLARY
Glucose-Capillary: 223 mg/dL — ABNORMAL HIGH (ref 70–99)
Glucose-Capillary: 128 mg/dL — ABNORMAL HIGH (ref 70–99)
Glucose-Capillary: 238 mg/dL — ABNORMAL HIGH (ref 70–99)
Glucose-Capillary: 142 mg/dL — ABNORMAL HIGH (ref 70–99)
Glucose-Capillary: 257 mg/dL — ABNORMAL HIGH (ref 70–99)

## 2021-11-15 LAB — BRAIN NATRIURETIC PEPTIDE: B Natriuretic Peptide: 111.6 pg/mL — ABNORMAL HIGH (ref 0.0–100.0)

## 2021-11-15 LAB — LACTATE DEHYDROGENASE: LDH: 1387 U/L — ABNORMAL HIGH (ref 98–192)

## 2021-11-15 LAB — URIC ACID: Uric Acid, Serum: 4.2 mg/dL (ref 2.5–7.1)

## 2021-11-15 LAB — MAGNESIUM: Magnesium: 2.1 mg/dL (ref 1.7–2.4)

## 2021-11-15 LAB — TSH: TSH: 21.29 u[IU]/mL — ABNORMAL HIGH (ref 0.350–4.500)

## 2021-11-15 MED ORDER — LEVOTHYROXINE SODIUM 50 MCG PO TABS
62.5000 ug | ORAL_TABLET | Freq: Every day | ORAL | Status: DC
  Administered 2021-11-16 – 2021-11-21 (×6): 62.5 ug via ORAL
  Filled 2021-11-15 (×6): qty 1

## 2021-11-15 MED ORDER — POTASSIUM CHLORIDE CRYS ER 20 MEQ PO TBCR
40.0000 meq | EXTENDED_RELEASE_TABLET | Freq: Once | ORAL | Status: AC
Start: 2021-11-15 — End: 2021-11-15
  Administered 2021-11-15: 40 meq via ORAL
  Filled 2021-11-15: qty 2

## 2021-11-15 MED ORDER — SODIUM CHLORIDE 0.9 % IV SOLN
1.0000 g | Freq: Three times a day (TID) | INTRAVENOUS | Status: DC
  Administered 2021-11-15 – 2021-11-18 (×11): 1 g via INTRAVENOUS
  Filled 2021-11-15: qty 1
  Filled 2021-11-15 (×4): qty 20
  Filled 2021-11-15: qty 1
  Filled 2021-11-15 (×5): qty 20

## 2021-11-15 MED ORDER — METOLAZONE 5 MG PO TABS
5.0000 mg | ORAL_TABLET | Freq: Two times a day (BID) | ORAL | Status: DC
  Administered 2021-11-15 – 2021-11-18 (×6): 5 mg via ORAL
  Filled 2021-11-15 (×6): qty 1

## 2021-11-15 NOTE — Assessment & Plan Note (Addendum)
PT/OT-no need identified ?

## 2021-11-15 NOTE — Progress Notes (Signed)
Pamela [name] is in the midst of her neutropenia now.  She is not febrile.  Her legs still look quite swollen and erythematous.  I am going to change her antibiotic to Merrem.  I think she needs broad-spectrum coverage right now.  I suspect that her white cells are going to be low for a few days. ? ?We are trying to get the fluid out of her legs.  I know this has been quite difficult.  Thankfully, her liver is doing much better.  The LDH is down to 1400 now.  She does have an elevated beta natruretic peptide.  I will know if she needs to have an echocardiogram done.  Her albumin is 2.9.  We gave her some albumin yesterday.  Her bilirubin is down to 1.3.  Her potassium is 3.6. ? ?She is thrombocytopenic but not bleeding so we will hold off on platelets.  Her hemoglobin is 11.2. ? ?She is eating okay.  She is having no nausea or vomiting.  She is having no cough.  There is no shortness of breath. ? ?I am going to try some Zaroxolyn before her Lasix.  We will see if this may help get out some of the fluid in her legs. ? ?Her vital signs are temperature 97.4.  Pulse 88.  Blood pressure 132/55.  Her lungs sound clear bilaterally.  She has good air movement bilaterally.  Oral exam does not show mucositis.  Cardiac exam regular rate and rhythm.  Abdomen is soft.  Her liver edge is shrinking.  It might be at the right costal margin now.  Extremity shows marked 3+ edema in her legs.  She has marked erythema in the lower legs.  Neurological exam is nonfocal. ? ?For right now, she is on Neupogen to help with the neutropenia.  We will switch her over to Merrem to try to help with broad-spectrum coverage. ? ?She still has a lot of fluid in the legs.  Hopefully, for the liver improving, she will be able to have better blood flow out of her legs.  Again we will try some Zaroxolyn on her before her Lasix. ? ?I do not anticipate that her white cells will improve probably for another 3 to 4 days.  We will have to limit her visitors. ? ?I  do appreciate the great care she is getting from the staff upon 6 E.  This is very complicated and the staff are doing a tremendous job helping her. ? ?[name], MD ? ?Romans 1:16 ?

## 2021-11-15 NOTE — Progress Notes (Signed)
Lab tech called during shift change for critical result of platelets 22 and NEUT # 0.2 on call provider paged. Provider responded very busy. Report given to oncoming RN for further follow up, RN accepted, Patient is stable. ?

## 2021-11-15 NOTE — Evaluation (Signed)
Occupational Therapy Evaluation Patient Details Name: Pamela [name] MRN: [id number] DOB: July 24, 1961 Today's Date: 11/15/2021   History of Present Illness 61 year old F admitted from oncology office with AKI and concern for TLS.  She completed first cycle of chemotherapy.  She developed pancytopenia.  PMH with recent diagnosis of small cell lung cancer with mets who started chemo on 2/20, DM-2, HTN, HLD and hypothyroidism   Clinical Impression   Patient is currently requiring assistance with ADLs including moderate to maximum assist with Lower body ADLs, setup assist with seated Upper body ADLs,  as well as supervision assist with functional transfers to toilet with use of a RW for pain management.  Current level of function is below patient's typical baseline.  During this evaluation, patient was limited by generalized weakness, impaired activity tolerance, and BLE edema and pain, all of which has the potential to impact patient's safety and independence during functional mobility, as well as performance for ADLs.  Patient lives with her spouse, who is able to provide 24/7 supervision and assistance.  Patient demonstrates good rehab potential, and should benefit from continued skilled occupational therapy services while in acute care to maximize safety, independence and quality of life at home.   ?       Recommendations for follow up therapy are one component of a multi-disciplinary discharge planning process, led by the attending physician.  Recommendations may be updated based on patient status, additional functional criteria and insurance authorization.   Follow Up Recommendations  No OT follow up    Assistance Recommended at Discharge Intermittent Supervision/Assistance  Patient can return home with the following Assistance with cooking/housework;A little help with bathing/dressing/bathroom    Functional Status Assessment  Patient has had a recent decline in their functional status and  demonstrates the ability to make significant improvements in function in a reasonable and predictable amount of time.  Equipment Recommendations   (2 wheeled RW with 5" wheels)    Recommendations for Other Services PT consult     Precautions / Restrictions Precautions Precautions: Fall Precaution Comments: Protective Precautions Restrictions Weight Bearing Restrictions: No      Mobility Bed Mobility                    Transfers                          Balance Overall balance assessment: Mild deficits observed, not formally tested                                         ADL either performed or assessed with clinical judgement   ADL Overall ADL's : Needs assistance/impaired Eating/Feeding: Independent   Grooming: Sitting;Set up Grooming Details (indicate cue type and reason): Did not test standing today due to Bilateral foot pain. Upper Body Bathing: Sitting;Set up   Lower Body Bathing: Moderate assistance;Minimal assistance;Sit to/from stand;Sitting/lateral leans Lower Body Bathing Details (indicate cue type and reason): Min-Mod As due to LE pain and edema Upper Body Dressing : Set up;Sitting   Lower Body Dressing: Maximal assistance Lower Body Dressing Details (indicate cue type and reason): Pt able to lean forward and pull sock at ankle but otherwise requires assistance to both don and doff. Toilet Transfer: Doctor, hospital Details (indicate cue type and reason): Pt stood from recliner and performed in-room ambulation. Pt eduacted on use  of RW to off load feet and manage pain. Pt agreed to try this and did confirm that pain in feet improved with walker use. Pt returned to recliner with RW and supervision as she had no need to void. Toileting- Clothing Manipulation and Hygiene: Moderate assistance Toileting - Clothing Manipulation Details (indicate cue type and reason): Per pt report. Pt educated on and  given handout on bidet attachments as pt reported crying when granddaughter had to assist with posterior hygiene. Pt verbalized understanding.     Functional mobility during ADLs: Supervision/safety;Rolling walker (2 wheels)       Vision Baseline Vision/History: 1 Wears glasses Vision Assessment?: No apparent visual deficits     Perception Perception Perception: Within Functional Limits   Praxis Praxis Praxis: Intact    Pertinent Vitals/Pain Pain Assessment Pain Assessment: 0-10 Pain Score: 10-Worst pain ever Pain Location: Pt reports bilatearl foot pain with initial weight bearing of 10/10. Pt reports the pain lessens once up for a little bit. 0/10 pain at rest. Pain Descriptors / Indicators: Sharp, Stabbing Pain Intervention(s): Limited activity within patient's tolerance     Hand Dominance Right   Extremity/Trunk Assessment Upper Extremity Assessment Upper Extremity Assessment: Overall WFL for tasks assessed   Lower Extremity Assessment Lower Extremity Assessment: Defer to PT evaluation   Cervical / Trunk Assessment Cervical / Trunk Assessment: Normal   Communication Communication Communication: No difficulties   Cognition Arousal/Alertness: Awake/alert Behavior During Therapy: WFL for tasks assessed/performed Overall Cognitive Status: Within Functional Limits for tasks assessed                                       General Comments  Bil LEs wrapped with edema and redness/scaling to lower legs.    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 Prior to 1 week ago pt could perform all indoor IADLs.        OT Problem List: Pain;Decreased activity tolerance;Impaired UE functional use;Obesity      OT Treatment/Interventions: Self-care/ADL training;Therapeutic exercise;Therapeutic activities;Energy conservation;Patient/family education;DME and/or AE instruction;Balance training    OT Goals(Current goals can be found in the care plan section) Acute Rehab OT Goals Patient Stated Goal: For Bilateral foot edema and pain to decrease. OT Goal Formulation: With patient Time For Goal Achievement: 11/29/21 Potential to Achieve Goals: Good ADL Goals Pt Will Perform Grooming: standing;with modified independence (3/3 with pain report <5/10 to feet) Pt Will Perform Lower Body Bathing: with adaptive equipment;with modified independence Pt Will Perform Lower Body Dressing: with adaptive equipment;with modified independence Pt Will Transfer to Toilet: with modified independence;ambulating Pt Will Perform Toileting - Clothing Manipulation and hygiene: with  modified independence;with adaptive equipment;sitting/lateral leans;sit to/from stand (Will verbalize understanding of adaptive options)  OT Frequency: Min 2X/week    Co-evaluation              AM-PAC OT "6 Clicks" Daily Activity     Outcome Measure Help from another person eating meals?: None Help from another person taking care of personal grooming?: A Little Help from another person toileting, which includes using toliet, bedpan, or urinal?: A Little Help from another person bathing (including washing, rinsing,  drying)?: A Lot Help from another person to put on and taking off regular upper body clothing?: A Little Help from another person to put on and taking off regular lower body clothing?: A Lot 6 Click Score: 17   End of Session Equipment Utilized During Treatment: Rolling walker (2 wheels) Nurse Communication: Other (comment) (Unable to locate nurse. Phone on desk when called.)  Activity Tolerance: Patient tolerated treatment well;No increased pain Patient left: in chair;with call bell/phone within reach  OT Visit Diagnosis: Unsteadiness on feet (R26.81);Pain Pain - part of body: Ankle and joints of foot                Time: 7672-0947 OT Time Calculation (min): 24 min Charges:  OT General Charges $OT Visit: 1 Visit OT Evaluation $OT Eval Low Complexity: 1 Low OT Treatments $Therapeutic Activity: 8-22 mins  Anderson Malta, OT Acute Rehab Services Office: [phone] 11/15/2021 Julien Girt 11/15/2021, 12:18 PM

## 2021-11-15 NOTE — Progress Notes (Deleted)
Patient's BS at night 224 per CNA Ysidro Evert, it did not communicate to the system. ?

## 2021-11-15 NOTE — Progress Notes (Signed)
PROGRESS NOTE  Pamela [name] BMW:[id number] DOB: 12/12/1960   PCP: Marinda Elk, MD  Patient is from: Home  DOA: 11/03/2021 LOS: 62  Chief complaints:  Chief Complaint  Patient presents with   abnormal labs     Brief Narrative / Interim history: 61 year old F with recent diagnosis of small cell lung cancer with mets who started chemo on 2/20, DM-2, HTN, HLD and hypothyroidism admitted from oncology office with AKI and concern for TLS.  She completed first cycle of chemotherapy.  She developed pancytopenia.  Started on Granix.  Oncology following.  Monitoring labs including LFT and LDH due to risk for TLS.  There is also concern about possible pneumonia and BLE cellulitis with significant edema.  Started on CTX, Zithromax and Lasix.  Antibiotics escalated to IV meropenem by oncology.  TTE with LVEF of 60 to 65%, G1 DD and normal RVSP    Subjective: Seen and examined earlier this morning.  No major events overnight of this morning.  She reports improvement in her breathing but continues to endorse orthopnea.  Still with significant LE edema and erythema.  Objective: Vitals:   11/14/21 2152 11/15/21 0449 11/15/21 0800 11/15/21 1543  BP: (!) 125/59 (!) 132/55 (!) 126/55 (!) 130/51  Pulse: 91 88 89 89  Resp: 18 18 18 18   Temp: 98.1 F (36.7 C) (!) 97.4 F (36.3 C) 97.8 F (36.6 C) 98 F (36.7 C)  TempSrc: Oral Oral Oral Oral  SpO2: 91% 94% 93% 93%  Weight:      Height:        Examination:  GENERAL: No apparent distress.  Nontoxic. HEENT: MMM.  Vision and hearing grossly intact.  NECK: Supple.  No apparent JVD.  RESP: 93% on RA.  No IWOB.  Fair aeration bilaterally. CVS:  RRR. Heart sounds normal.  ABD/GI/GU: BS+. Abd soft, NTND.  MSK/EXT:  Moves extremities.  Significant BLE edema with erythema.  Slightly warm to touch. SKIN: Significant BLE erythema NEURO: Awake and alert. Oriented appropriately.  No apparent focal neuro deficit. PSYCH: Calm. Normal affect.    Procedures:  None  Microbiology summarized: [id number] and influenza PCR nonreactive.  Assessment and Plan: * Small cell lung cancer ([place]) Diagnosed 2 weeks PTA S/P Liver biopsy 2/13.  Completed chemo inpatient being monitored for TLS and any adverse reaction.   -Continue pain control with fentanyl patch and oxycodone.  -Cont plan per Onco- likely needs Port once LUE PICC removed. -Oncology following  Physical deconditioning PT/OT  Pneumonia Ruled out.  CXR finding likely atelectasis.  Procalcitonin might not be reliable in this patient after chemo. -Already completed course of Zithromax -CTX>>> meropenem mainly for cellulitis  Cellulitis of lower extremity Difficult diagnosis to make given edema/possible venous insufficiency.  Edema could be from malignancy as well.  TSH elevated to 22 but free T4 within normal.  Albumin 2.4.  TTE reassuring. -Antibiotics escalated to meropenem given severe neutropenia -Encourage leg elevation.  Hypokalemia and hypomagnesemia K3.2.  Mg 1.4. -P.o. KCl 40x2 -IV magnesium sulfate 4 g x 1  Pancytopenia (HCC) Likely due to chemo.  Received 2 units of blood on 2/28.  Hgb improved.  Neutropenia and thrombocytopenia worse.  No signs of bleeding. -Continue Granix per oncology -Continue monitoring   Hyponatremia For to be from hypovolemia.  She could have SIADH from malignancy.  May be hypothyroidism Continue monitoring  Elevated liver enzymes Recent Labs  Lab 11/11/21 0559 11/12/21 0500 11/13/21 0506 11/14/21 0600 11/15/21 0500  AST 788* 596* 386*  242* 136*  ALT 102* 84* 69* 59* 47*  ALKPHOS 390* 347* 310* 278* 248*  BILITOT 2.8* 2.0* 1.5* 1.5* 1.3*  PROT 5.6* 5.6* 5.6* 6.0* 6.4*  ALBUMIN 2.4* 2.2* 2.2* 2.4* 2.9*  Improving.  Continue monitoring    Hyperuricemia concern/risk for tumor lysis syndrome  Continue monitoring labs.  LDH trending down.  Uric acid down to 4.2.  Renal function stable.   Metabolic acidosis, increased  anion gap Likely secondary to AKI.  Resolved.  AKI (acute kidney injury) ([place]) Recent Labs    11/06/21 0525 11/07/21 0832 11/08/21 0530 11/09/21 0532 11/10/21 0500 11/11/21 0559 11/12/21 0500 11/13/21 0506 11/14/21 0600 11/15/21 0500  BUN 40* 37* 36* 33* 25* 19 16 17 21 21   CREATININE 1.09* 1.11* 1.05* 0.95 0.81 0.63 0.62 0.66 0.59 0.58  -Continue monitoring while on diuretics   Type 2 diabetes mellitus without complication, without long-term current use of insulin (HCC) A1c 6.8% in 10/2021. Recent Labs  Lab 11/14/21 1708 11/14/21 2208 11/15/21 0736 11/15/21 1152 11/15/21 1731  GLUCAP 200* 223* 128* 238* 142*  -Continue current insulin regimen -Continue holding metformin  Essential hypertension Normotensive for most part. -On diuretics.  Chronic anemia Recent Labs    11/06/21 0525 11/07/21 0832 11/08/21 0530 11/09/21 0532 11/10/21 0500 11/11/21 0559 11/12/21 0500 11/13/21 0506 11/14/21 0600 11/15/21 0500  HGB 11.1* 11.0* 10.9* 10.7* 10.6* 9.8* 9.2* 8.7* 11.7* 11.2*  Hemoglobin stable after transfusion.  See pancytopenia for more   Acquired hypothyroidism TSH elevated to 22 but free T4 within normal range at 0.85.  She reports good compliance with her Synthroid.  Although it is difficult to interpret elevated TSH in acute setting, she may benefit from increasing his Synthroid from 50 to 62.5 mcg   Increased nutrient needs Body mass index is 39.87 kg/m. Nutrition Problem: Increased nutrient needs Etiology: cancer and cancer related treatments Signs/Symptoms: estimated needs Interventions: Ensure Enlive (each supplement provides 350kcal and 20 grams of protein), MVI   DVT prophylaxis:  enoxaparin (LOVENOX) injection 40 mg Start: 11/08/21 1000 Place TED hose Start: 11/05/21 0711 SCDs Start: 11/03/21 1431 Place TED hose Start: 11/03/21 1431  Code Status: Full code Family Communication: Updated patient's daughter-in-law at bedside on 3/1.  None at  bedside today. Level of care: Med-Surg Status is: Inpatient Remains inpatient appropriate because: Pancytopenia and significant BLE edema with possible cellulitis     Final disposition: Likely home once medically stable.      Consultants:  Oncology   Sch Meds:  Scheduled Meds:  antiseptic oral rinse  15 mL Mouth Rinse Q6H   Chlorhexidine Gluconate Cloth  6 each Topical Daily   enoxaparin (LOVENOX) injection  40 mg Subcutaneous Q24H   feeding supplement  237 mL Oral BID BM   furosemide  40 mg Intravenous BID   insulin aspart  0-9 Units Subcutaneous TID WC   levothyroxine  50 mcg Oral QAC breakfast   metolazone  5 mg Oral Q12H   multivitamin with minerals  1 tablet Oral Daily   pantoprazole  40 mg Oral BID   polyethylene glycol  17 g Oral Daily   protein supplement  6 g Oral TID WC   senna-docusate  1 tablet Oral BID   sodium chloride flush  10-40 mL Intracatheter Q12H   Tbo-Filgrastim  480 mcg Subcutaneous q1800   Continuous Infusions:  meropenem (MERREM) IV 1 g (11/15/21 1615)   PRN Meds:.acetaminophen, albuterol, alum & mag hydroxide-simeth, heparin lock flush, lidocaine, LORazepam, ondansetron, oxyCODONE, prochlorperazine, senna-docusate, sodium  chloride flush, temazepam  Antimicrobials: Anti-infectives (From admission, onward)    Start     Dose/Rate Route Frequency Ordered Stop   11/15/21 0800  meropenem (MERREM) 1 g in sodium chloride 0.9 % 100 mL IVPB        1 g 200 mL/hr over 30 Minutes Intravenous Every 8 hours 11/15/21 0702     11/12/21 2100  cefTRIAXone (ROCEPHIN) 2 g in sodium chloride 0.9 % 100 mL IVPB  Status:  Discontinued        2 g 200 mL/hr over 30 Minutes Intravenous Every 24 hours 11/12/21 1958 11/15/21 0702   11/12/21 2100  azithromycin (ZITHROMAX) tablet 500 mg        500 mg Oral Daily at bedtime 11/12/21 1958 11/14/21 2115        I have personally reviewed the following labs and images: CBC: Recent Labs  Lab 11/11/21 0559  11/12/21 0500 11/13/21 0506 11/14/21 0600 11/15/21 0500  WBC 8.2 5.0 2.1* 1.2* 0.6*  NEUTROABS 7.4 4.3 1.6* 0.8* 0.2*  HGB 9.8* 9.2* 8.7* 11.7* 11.2*  HCT 31.3* 29.4* 27.6* 35.7* 34.3*  MCV 93.7 93.9 93.2 91.5 93.7  PLT 121* 103* 79* 46* 22*   BMP &GFR Recent Labs  Lab 11/11/21 0559 11/12/21 0500 11/13/21 0506 11/14/21 0600 11/14/21 0908 11/15/21 0500  NA 128* 132* 132* 134*  --  134*  K 4.0 3.7 3.4* 3.2*  --  3.6  CL 93* 98 94* 94*  --  95*  CO2 26 28 31 31   --  32  GLUCOSE 141* 135* 120* 128*  --  124*  BUN 19 16 17 21   --  21  CREATININE 0.63 0.62 0.66 0.59  --  0.58  CALCIUM 7.3* 7.8* 7.5* 7.6*  --  7.8*  MG  --   --   --   --  1.4* 2.1   Estimated Creatinine Clearance: 81.1 mL/min (by C-G formula based on SCr of 0.58 mg/dL). Liver & Pancreas: Recent Labs  Lab 11/11/21 0559 11/12/21 0500 11/13/21 0506 11/14/21 0600 11/15/21 0500  AST 788* 596* 386* 242* 136*  ALT 102* 84* 69* 59* 47*  ALKPHOS 390* 347* 310* 278* 248*  BILITOT 2.8* 2.0* 1.5* 1.5* 1.3*  PROT 5.6* 5.6* 5.6* 6.0* 6.4*  ALBUMIN 2.4* 2.2* 2.2* 2.4* 2.9*   No results for input(s): LIPASE, AMYLASE in the last 168 hours. No results for input(s): AMMONIA in the last 168 hours. Diabetic: No results for input(s): HGBA1C in the last 72 hours. Recent Labs  Lab 11/14/21 1708 11/14/21 2208 11/15/21 0736 11/15/21 1152 11/15/21 1731  GLUCAP 200* 223* 128* 238* 142*   Cardiac Enzymes: No results for input(s): CKTOTAL, CKMB, CKMBINDEX, TROPONINI in the last 168 hours. No results for input(s): PROBNP in the last 8760 hours. Coagulation Profile: No results for input(s): INR, PROTIME in the last 168 hours. Thyroid Function Tests: Recent Labs    11/15/21 0500  TSH 21.290*  FREET4 0.85   Lipid Profile: No results for input(s): CHOL, HDL, LDLCALC, TRIG, CHOLHDL, LDLDIRECT in the last 72 hours. Anemia Panel: No results for input(s): VITAMINB12, FOLATE, FERRITIN, TIBC, IRON, RETICCTPCT in the last  72 hours. Urine analysis:    Component Value Date/Time   COLORURINE AMBER (A) 11/03/2021 2255   APPEARANCEUR HAZY (A) 11/03/2021 2255   LABSPEC 1.014 11/03/2021 2255   PHURINE 5.0 11/03/2021 2255   GLUCOSEU NEGATIVE 11/03/2021 2255   HGBUR NEGATIVE 11/03/2021 2255   BILIRUBINUR NEGATIVE 11/03/2021 2255   KETONESUR 5 (  A) 11/03/2021 2255   PROTEINUR NEGATIVE 11/03/2021 2255   NITRITE NEGATIVE 11/03/2021 2255   LEUKOCYTESUR NEGATIVE 11/03/2021 2255   Sepsis Labs: Invalid input(s): PROCALCITONIN, [place]  Microbiology: No results found for this or any previous visit (from the past 240 hour(s)).  Radiology Studies: ECHOCARDIOGRAM COMPLETE  Result Date: 11/15/2021    ECHOCARDIOGRAM REPORT   Patient Name:   MURLINE WEIGEL Date of Exam: 11/15/2021 Medical Rec #:  [id number]    Height:       62.0 in Accession #:    [id number]   Weight:       218.0 lb Date of Birth:  October 20, 1960    BSA:          1.983 m Patient Age:    60 years     BP:           126/55 mmHg Patient Gender: F            HR:           96 bpm. Exam Location:  Inpatient Procedure: 2D Echo, Cardiac Doppler and Color Doppler Indications:    Orthopnea  History:        Patient has no prior history of Echocardiogram examinations.                 Signs/Symptoms:Edema; Risk Factors:Diabetes.  Sonographer:    Luisa Hart RDCS Referring Phys: [id number] Charlesetta Ivory Delfin Squillace  Sonographer Comments: Technically difficult study due to poor echo windows. IMPRESSIONS  1. Left ventricular ejection fraction, by estimation, is 60 to 65%. Left ventricular ejection fraction by 2D MOD biplane is 60.8 %. The left ventricle has normal function. The left ventricle has no regional wall motion abnormalities. There is moderate left ventricular hypertrophy. Left ventricular diastolic parameters are consistent with Grade I diastolic dysfunction (impaired relaxation).  2. Right ventricular systolic function is normal. The right ventricular size is normal. Tricuspid regurgitation  signal is inadequate for assessing PA pressure.  3. The mitral valve is grossly normal. No evidence of mitral valve regurgitation.  4. The aortic valve was not well visualized. Aortic valve regurgitation is not visualized.  5. The inferior vena cava is dilated in size with <50% respiratory variability, suggesting right atrial pressure of 15 mmHg. Comparison(s): No prior Echocardiogram. FINDINGS  Left Ventricle: Left ventricular ejection fraction, by estimation, is 60 to 65%. Left ventricular ejection fraction by 2D MOD biplane is 60.8 %. The left ventricle has normal function. The left ventricle has no regional wall motion abnormalities. The left ventricular internal cavity size was normal in size. There is moderate left ventricular hypertrophy. Left ventricular diastolic parameters are consistent with Grade I diastolic dysfunction (impaired relaxation). Indeterminate filling pressures. Right Ventricle: The right ventricular size is normal. No increase in right ventricular wall thickness. Right ventricular systolic function is normal. Tricuspid regurgitation signal is inadequate for assessing PA pressure. Left Atrium: Left atrial size was normal in size. Right Atrium: Right atrial size was normal in size. Pericardium: There is no evidence of pericardial effusion. Mitral Valve: The mitral valve is grossly normal. No evidence of mitral valve regurgitation. Tricuspid Valve: The tricuspid valve is normal in structure. Tricuspid valve regurgitation is not demonstrated. Aortic Valve: The aortic valve was not well visualized. Aortic valve regurgitation is not visualized. Aortic valve mean gradient measures 6.0 mmHg. Aortic valve peak gradient measures 9.7 mmHg. Aortic valve area, by VTI measures 3.20 cm. Pulmonic Valve: The pulmonic valve was grossly normal. Pulmonic valve regurgitation is not visualized. Aorta:  The aortic root and ascending aorta are structurally normal, with no evidence of dilitation. Venous: The  inferior vena cava is dilated in size with less than 50% respiratory variability, suggesting right atrial pressure of 15 mmHg. IAS/Shunts: No atrial level shunt detected by color flow Doppler. Additional Comments: There is pleural effusion in the left lateral region.  LEFT VENTRICLE PLAX 2D                        Biplane EF (MOD) LVIDd:         2.90 cm         LV Biplane EF:   Left LVIDs:         2.90 cm                          ventricular LV PW:         1.40 cm                          ejection LV IVS:        1.60 cm                          fraction by LVOT diam:     2.20 cm                          2D MOD LV SV:         88                               biplane is LV SV Index:   44                               60.8 %. LVOT Area:     3.80 cm                                Diastology                                LV e' medial:    6.74 cm/s LV Volumes (MOD)               LV E/e' medial:  14.6 LV vol d, MOD    62.1 ml       LV e' lateral:   9.25 cm/s A2C:                           LV E/e' lateral: 10.6 LV vol d, MOD    40.8 ml A4C: LV vol s, MOD    24.1 ml A2C: LV vol s, MOD    15.1 ml A4C: LV SV MOD A2C:   38.0 ml LV SV MOD A4C:   40.8 ml LV SV MOD BP:    30.7 ml RIGHT VENTRICLE RV Basal diam:  4.10 cm RV Mid diam:    3.70 cm TAPSE (M-mode): 2.2 cm LEFT ATRIUM             Index LA Vol (A2C):   47.7 ml 24.05 ml/m LA Vol (A4C):  36.1 ml 18.20 ml/m LA Biplane Vol: 41.9 ml 21.13 ml/m  AORTIC VALVE                     PULMONIC VALVE AV Area (Vmax):    2.85 cm      PV Vmax:       1.08 m/s AV Area (Vmean):   2.85 cm      PV Vmean:      61.600 cm/s AV Area (VTI):     3.20 cm      PV VTI:        0.184 m AV Vmax:           156.00 cm/s   PV Peak grad:  4.6 mmHg AV Vmean:          113.000 cm/s  PV Mean grad:  2.0 mmHg AV VTI:            0.276 m AV Peak Grad:      9.7 mmHg AV Mean Grad:      6.0 mmHg LVOT Vmax:         117.00 cm/s LVOT Vmean:        84.700 cm/s LVOT VTI:          0.232 m LVOT/AV VTI ratio: 0.84  AORTA  Ao Root diam: 2.80 cm MITRAL VALVE MV Area (PHT): 6.71 cm    SHUNTS MV Decel Time: 113 msec    Systemic VTI:  0.23 m MV E velocity: 98.50 cm/s  Systemic Diam: 2.20 cm MV A velocity: 74.70 cm/s MV E/A ratio:  1.32 Lyman Bishop MD Electronically signed by Lyman Bishop MD Signature Date/Time: 11/15/2021/3:33:30 PM    Final       Husein Guedes T. [place]  If 7PM-7AM, please contact night-coverage [email or web address] 11/15/2021, 5:42 PM

## 2021-11-16 ENCOUNTER — Inpatient Hospital Stay (HOSPITAL_COMMUNITY): Payer: BC Managed Care – PPO

## 2021-11-16 DIAGNOSIS — J90 Pleural effusion, not elsewhere classified: Secondary | ICD-10-CM

## 2021-11-16 DIAGNOSIS — R5381 Other malaise: Secondary | ICD-10-CM

## 2021-11-16 LAB — COMPREHENSIVE METABOLIC PANEL
ALT: 44 U/L (ref 0–44)
AST: 93 U/L — ABNORMAL HIGH (ref 15–41)
Albumin: 2.7 g/dL — ABNORMAL LOW (ref 3.5–5.0)
Alkaline Phosphatase: 221 U/L — ABNORMAL HIGH (ref 38–126)
Anion gap: 10 (ref 5–15)
BUN: 16 mg/dL (ref 8–23)
CO2: 32 mmol/L (ref 22–32)
Calcium: 7.6 mg/dL — ABNORMAL LOW (ref 8.9–10.3)
Chloride: 91 mmol/L — ABNORMAL LOW (ref 98–111)
Creatinine, Ser: 0.58 mg/dL (ref 0.44–1.00)
GFR, Estimated: >60 mL/min (ref >60)
Glucose, Bld: 144 mg/dL — ABNORMAL HIGH (ref 70–99)
Potassium: 3.1 mmol/L — ABNORMAL LOW (ref 3.5–5.1)
Sodium: 133 mmol/L — ABNORMAL LOW (ref 135–145)
Total Bilirubin: 1.3 mg/dL — ABNORMAL HIGH (ref 0.3–1.2)
Total Protein: 6.2 g/dL — ABNORMAL LOW (ref 6.5–8.1)

## 2021-11-16 LAB — CBC WITH DIFFERENTIAL/PLATELET
Abs Immature Granulocytes: 0 10*3/uL (ref 0.00–0.07)
Basophils Absolute: 0 10*3/uL (ref 0.0–0.1)
Basophils Relative: 4 %
Eosinophils Absolute: 0 10*3/uL (ref 0.0–0.5)
Eosinophils Relative: 2 %
HCT: 35.1 % — ABNORMAL LOW (ref 36.0–46.0)
Hemoglobin: 11.2 g/dL — ABNORMAL LOW (ref 12.0–15.0)
Immature Granulocytes: 0 %
Lymphocytes Relative: 67 %
Lymphs Abs: 0.3 10*3/uL — ABNORMAL LOW (ref 0.7–4.0)
MCH: 29.9 pg (ref 26.0–34.0)
MCHC: 31.9 g/dL (ref 30.0–36.0)
MCV: 93.6 fL (ref 80.0–100.0)
Monocytes Absolute: 0.1 10*3/uL (ref 0.1–1.0)
Monocytes Relative: 10 %
Neutro Abs: 0.1 10*3/uL — CL (ref 1.7–7.7)
Neutrophils Relative %: 17 %
Platelets: 13 10*3/uL — CL (ref 150–400)
RBC: 3.75 MIL/uL — ABNORMAL LOW (ref 3.87–5.11)
RDW: 14.6 % (ref 11.5–15.5)
WBC: 0.5 10*3/uL — CL (ref 4.0–10.5)
nRBC: 0 % (ref 0.0–0.2)

## 2021-11-16 LAB — HEMOGLOBIN A1C
Hgb A1c MFr Bld: 5.8 % — ABNORMAL HIGH (ref 4.8–5.6)
Mean Plasma Glucose: 119.76 mg/dL

## 2021-11-16 LAB — GLUCOSE, CAPILLARY
Glucose-Capillary: 149 mg/dL — ABNORMAL HIGH (ref 70–99)
Glucose-Capillary: 262 mg/dL — ABNORMAL HIGH (ref 70–99)
Glucose-Capillary: 179 mg/dL — ABNORMAL HIGH (ref 70–99)
Glucose-Capillary: 248 mg/dL — ABNORMAL HIGH (ref 70–99)

## 2021-11-16 LAB — PHOSPHORUS: Phosphorus: 2.8 mg/dL (ref 2.5–4.6)

## 2021-11-16 LAB — LACTATE DEHYDROGENASE: LDH: 1020 U/L — ABNORMAL HIGH (ref 98–192)

## 2021-11-16 LAB — URIC ACID: Uric Acid, Serum: 4.2 mg/dL (ref 2.5–7.1)

## 2021-11-16 LAB — MAGNESIUM: Magnesium: 1.6 mg/dL — ABNORMAL LOW (ref 1.7–2.4)

## 2021-11-16 IMAGING — DX DG CHEST 2V
2 series · 2 of 2 positions shown · non-contrast
Comparison: Chest x-ray dated [DATE].

CLINICAL DATA: Weakness and shortness of breath. History of lung
cancer.

EXAM:
CHEST - 2 VIEW

[chest pa]
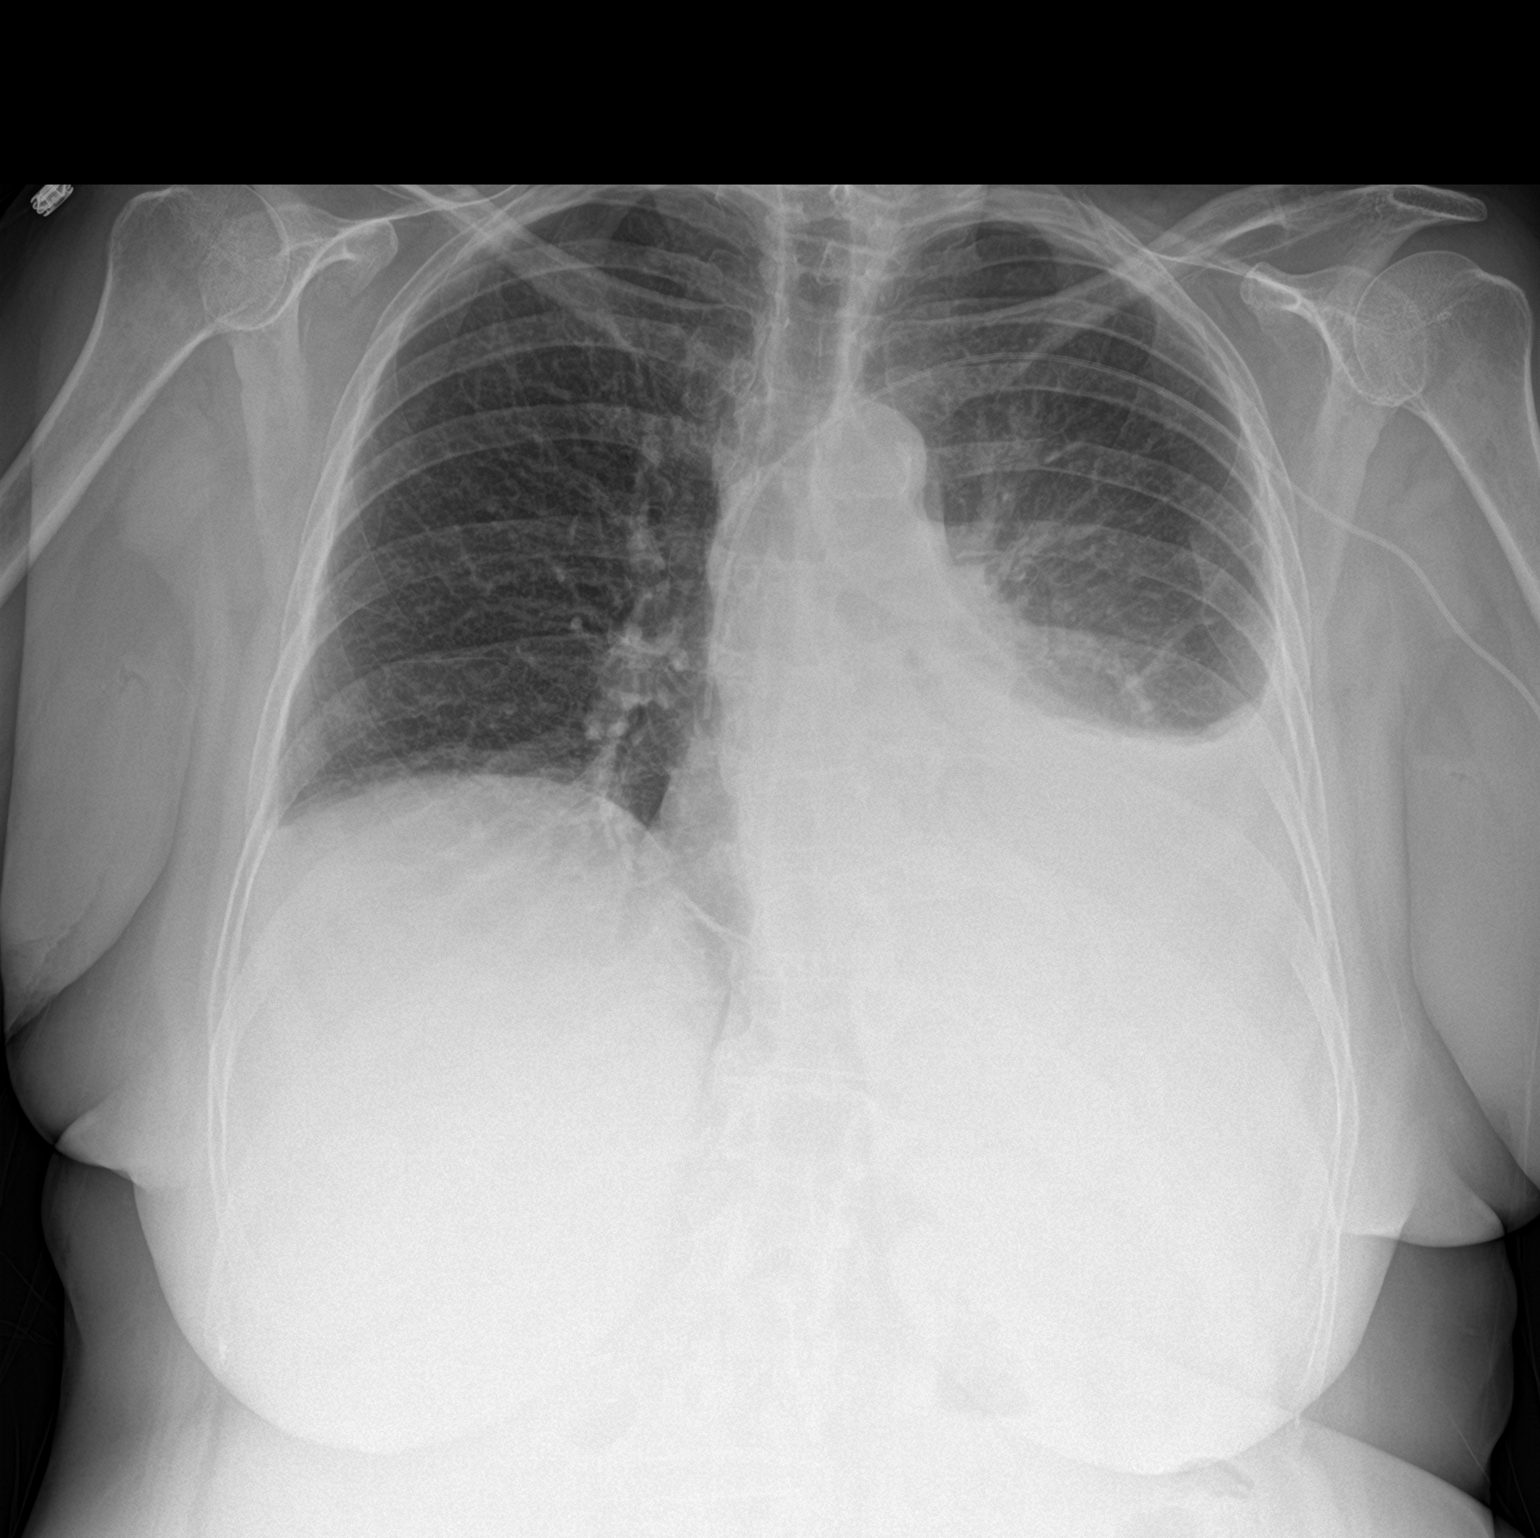

[chest lat]
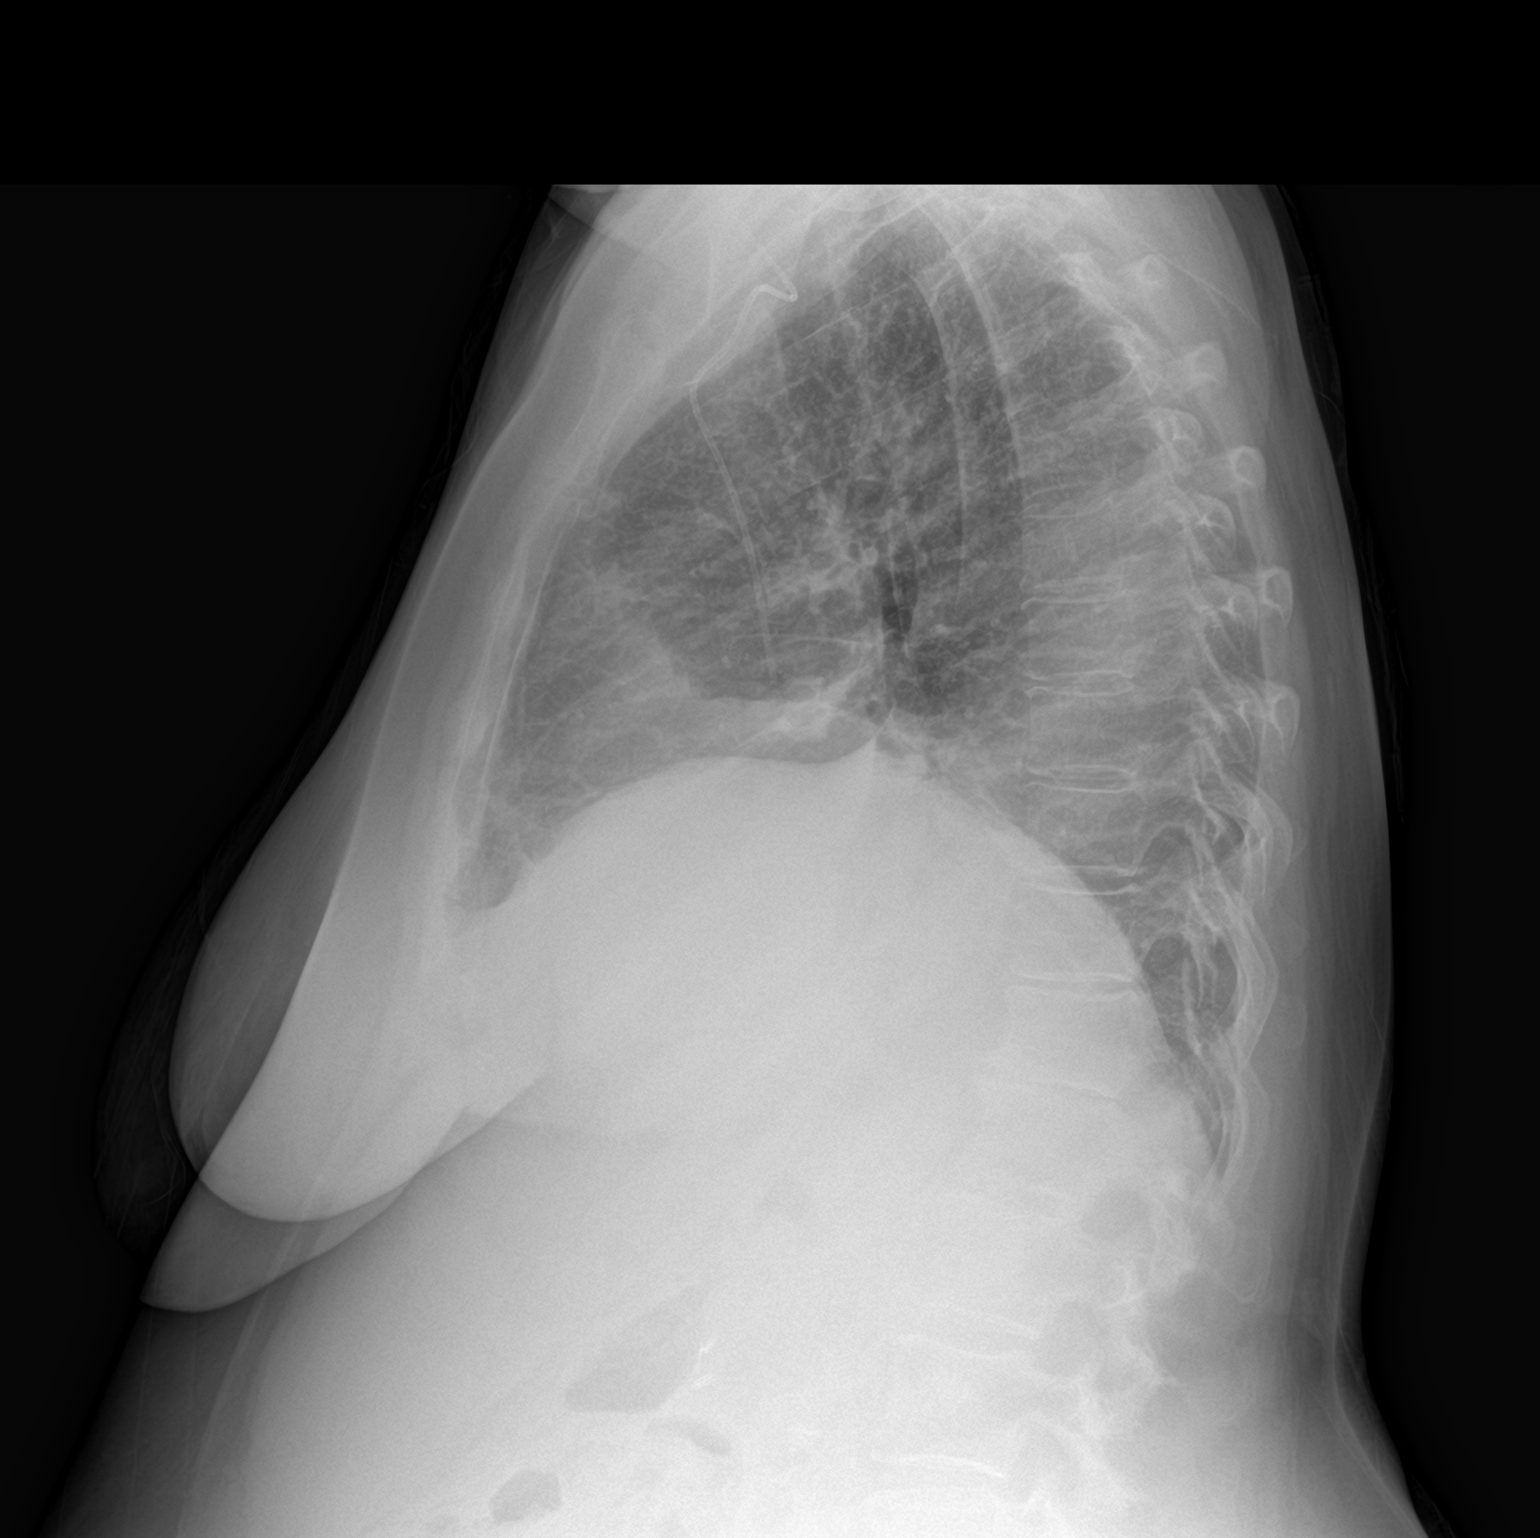

[2 of 2 positions shown; findings below may reference images not displayed]

FINDINGS: Unchanged left upper extremity PICC line. Stable cardiomediastinal
silhouette with obscured left heart border. Unchanged small to
moderate left pleural effusion with left lower lobe
collapse/consolidation. Unchanged trace right pleural effusion and
mild right basilar atelectasis. No pneumothorax. No acute osseous
abnormality.
IMPRESSION: 1. Unchanged bilateral pleural effusions and left lower lobe
collapse/consolidation.

## 2021-11-16 MED ORDER — INSULIN ASPART 100 UNIT/ML IJ SOLN
0.0000 [IU] | Freq: Three times a day (TID) | INTRAMUSCULAR | Status: DC
  Administered 2021-11-16 – 2021-11-19 (×10): 3 [IU] via SUBCUTANEOUS
  Administered 2021-11-20: 2 [IU] via SUBCUTANEOUS
  Administered 2021-11-20 (×2): 3 [IU] via SUBCUTANEOUS
  Administered 2021-11-21: 2 [IU] via SUBCUTANEOUS
  Administered 2021-11-21: 3 [IU] via SUBCUTANEOUS

## 2021-11-16 MED ORDER — POTASSIUM CHLORIDE CRYS ER 20 MEQ PO TBCR
40.0000 meq | EXTENDED_RELEASE_TABLET | ORAL | Status: AC
  Administered 2021-11-16 (×2): 40 meq via ORAL
  Filled 2021-11-16: qty 2

## 2021-11-16 MED ORDER — MAGNESIUM SULFATE 2 GM/50ML IV SOLN
2.0000 g | Freq: Once | INTRAVENOUS | Status: AC
  Administered 2021-11-16: 2 g via INTRAVENOUS
  Filled 2021-11-16: qty 50

## 2021-11-16 MED ORDER — POTASSIUM CHLORIDE 10 MEQ/50ML IV SOLN
10.0000 meq | INTRAVENOUS | Status: AC
  Administered 2021-11-16 (×4): 10 meq via INTRAVENOUS
  Filled 2021-11-16 (×4): qty 50

## 2021-11-16 NOTE — Progress Notes (Signed)
PROGRESS NOTE  Pamela [name] RSW:[id number] DOB: 08-23-1961   PCP: Marinda Elk, MD  Patient is from: Home  DOA: 11/03/2021 LOS: 55  Chief complaints:  Chief Complaint  Patient presents with   abnormal labs     Brief Narrative / Interim history: She61 year old F with recent diagnosis of small cell lung cancer with mets who started chemo on 2/20, DM-2, HTN, HLD and hypothyroidism admitted from oncology office with AKI and concern for TLS.  She completed first cycle of chemotherapy.  She developed pancytopenia.  Started on Granix. Monitoring labs including LFT and LDH due to risk for TLS.  There is also concern about possible pneumonia and BLE cellulitis with significant edema.  TTE with LVEF of 60 to 65%, G1 DD and normal RVSP.  Started on CTX, Zithromax and Lasix.  Metolazone added.  TSH elevated to 20.  Synthroid increased.  Antibiotics escalated to IV meropenem by oncology due to worsening neutropenia.  Also worsening thrombocytopenia.     Subjective: Seen and examined earlier this morning.  Somewhat frustrated about persistent leg edema and pain.  Reports burning pain when she bears weight on her feet.  Reports making a lot of urine.  She was under the impression that she is supposed to drink a lot of water.  She did not know that we are monitoring urine output either.  Objective: Vitals:   11/15/21 0800 11/15/21 1543 11/15/21 2027 11/15/21 2144  BP: (!) 126/55 (!) 130/51 108/63   Pulse: 89 89 98 (!) 109  Resp: 18 18 18    Temp: 97.8 F (36.6 C) 98 F (36.7 C) 99.3 F (37.4 C)   TempSrc: Oral Oral Oral   SpO2: 93% 93% 93% 93%  Weight:      Height:        Examination:  GENERAL: No apparent distress.  Nontoxic. HEENT: MMM.  Vision and hearing grossly intact.  NECK: Supple.  No apparent JVD.  RESP: On room air.  No IWOB.  Fair aeration bilaterally. CVS:  RRR. Heart sounds normal.  ABD/GI/GU: BS+. Abd soft, NTND.  MSK/EXT:  Moves extremities.  Significant BLE edema  with erythema.  No significant progression. SKIN: BLE erythema NEURO: Awake and alert. Oriented appropriately.  No apparent focal neuro deficit. PSYCH: Calm. Normal affect.     Procedures:  None  Microbiology summarized: [id number] and influenza PCR nonreactive.  Assessment and Plan: * Small cell lung cancer ([place]) Diagnosed 2 weeks PTA S/P Liver biopsy 2/13.  Completed chemo inpatient being monitored for TLS and any adverse reaction.   -Continue pain control with fentanyl patch and oxycodone.  -Cont plan per Onco- likely needs Port once LUE PICC removed. -Oncology following  Bilateral lower extremity swelling with possible cellulitis Difficult diagnosis to make given edema/possible venous insufficiency.  Edema could be from malignancy as well.  She also have significant erythema.  TSH elevated to 22 but free T4 within normal.  Albumin 2.4.  TTE reassuring. -Antibiotics escalated to meropenem on 3/2 given severe neutropenia -On IV Lasix and metolazone -Encourage leg elevation. -Fluid restriction, strict I&O and daily weight.   Elevated liver enzymes Recent Labs  Lab 11/12/21 0500 11/13/21 0506 11/14/21 0600 11/15/21 0500 11/16/21 0545  AST 596* 386* 242* 136* 93*  ALT 84* 69* 59* 47* 44  ALKPHOS 347* 310* 278* 248* 221*  BILITOT 2.0* 1.5* 1.5* 1.3* 1.3*  PROT 5.6* 5.6* 6.0* 6.4* 6.2*  ALBUMIN 2.2* 2.2* 2.4* 2.9* 2.7*  Improving.  Continue monitoring  Pleural effusion on left Likely related to lung cancer.  Stable on repeat chest x-ray. -Diuretics as above -Thoracocentesis?  Thrombocytopenia might be precluding  Pancytopenia ([place]) Likely due to chemo.  Received 2 units of blood on 2/28.  Hgb improved.  Neutropenia and thrombocytopenia worse.  No signs of bleeding. -Continue Granix per oncology -Lovenox discontinued due to severe thrombocytopenia. -May need platelet transfusion if platelet drops below 10,000 or if active bleed -Continue monitoring   Physical  deconditioning PT/OT  Pneumonia Ruled out.  CXR finding likely atelectasis.  Procalcitonin might not be reliable in this patient after chemo. -Already completed course of Zithromax -CTX>>> meropenem mainly for cellulitis  Hypokalemia and hypomagnesemia K3.3.  Mg 1.6. -P.o. KCl 40x2 -IV magnesium sulfate 2 g x 1  Hyponatremia For to be from hypervolemia.  She could have SIADH from malignancy.  May be hypothyroidism Continue monitoring -Diuretics and Synthroid as above  Hyperuricemia concern/risk for tumor lysis syndrome  Continue monitoring labs.  LDH trending down.  Uric acid down to 4.2.  Renal function stable.   Metabolic acidosis, increased anion gap Likely secondary to AKI.  Resolved.  AKI (acute kidney injury) ([place]) Recent Labs    11/07/21 0832 11/08/21 0530 11/09/21 0532 11/10/21 0500 11/11/21 0559 11/12/21 0500 11/13/21 0506 11/14/21 0600 11/15/21 0500 11/16/21 0545  BUN 37* 36* 33* 25* 19 16 17 21 21 16   CREATININE 1.11* 1.05* 0.95 0.81 0.63 0.62 0.66 0.59 0.58 0.58  -Continue monitoring while on diuretics   Type 2 diabetes mellitus without complication, without long-term current use of insulin ([facility]) A1c 6.8% in 10/2021. Recent Labs  Lab 11/15/21 1152 11/15/21 1731 11/15/21 2146 11/16/21 0737 11/16/21 1213  GLUCAP 238* 142* 257* 149* 262*  -Change SSI to moderate scale. -Continue holding metformin  Essential hypertension Normotensive for most part. -On diuretics.  Chronic anemia Recent Labs    11/07/21 0832 11/08/21 0530 11/09/21 0532 11/10/21 0500 11/11/21 0559 11/12/21 0500 11/13/21 0506 11/14/21 0600 11/15/21 0500 11/16/21 0545  HGB 11.0* 10.9* 10.7* 10.6* 9.8* 9.2* 8.7* 11.7* 11.2* 11.2*  -H&H stable.   Acquired hypothyroidism TSH elevated to 22 but free T4 within normal range at 0.85.  She reports good compliance with her Synthroid.  Although it is difficult to interpret elevated TSH in acute setting, it is reasonable to  increase her Synthroid from 50 to 62.5 mcg -Repeat TSH in 4 to 6 weeks   Increased nutrient needs Body mass index is 39.87 kg/m. Nutrition Problem: Increased nutrient needs Etiology: cancer and cancer related treatments Signs/Symptoms: estimated needs Interventions: Ensure Enlive (each supplement provides 350kcal and 20 grams of protein), MVI   DVT prophylaxis:  Place TED hose Start: 11/05/21 0711 SCDs Start: 11/03/21 1431 Place TED hose Start: 11/03/21 1431  Code Status: Full code Family Communication: None at bedside. Level of care: Med-Surg Status is: Inpatient Remains inpatient appropriate because: Pancytopenia and significant BLE edema with possible cellulitis     Final disposition: Likely home once medically stable.      Consultants:  Oncology   Sch Meds:  Scheduled Meds:  antiseptic oral rinse  15 mL Mouth Rinse Q6H   Chlorhexidine Gluconate Cloth  6 each Topical Daily   feeding supplement  237 mL Oral BID BM   furosemide  40 mg Intravenous BID   insulin aspart  0-9 Units Subcutaneous TID WC   levothyroxine  62.5 mcg Oral QAC breakfast   metolazone  5 mg Oral Q12H   multivitamin with minerals  1 tablet Oral  Daily   pantoprazole  40 mg Oral BID   polyethylene glycol  17 g Oral Daily   protein supplement  6 g Oral TID WC   senna-docusate  1 tablet Oral BID   sodium chloride flush  10-40 mL Intracatheter Q12H   Tbo-Filgrastim  480 mcg Subcutaneous q1800   Continuous Infusions:  meropenem (MERREM) IV 1 g (11/16/21 0749)   PRN Meds:.acetaminophen, albuterol, alum & mag hydroxide-simeth, heparin lock flush, lidocaine, LORazepam, ondansetron, oxyCODONE, prochlorperazine, senna-docusate, sodium chloride flush, temazepam  Antimicrobials: Anti-infectives (From admission, onward)    Start     Dose/Rate Route Frequency Ordered Stop   11/15/21 0800  meropenem (MERREM) 1 g in sodium chloride 0.9 % 100 mL IVPB        1 g 200 mL/hr over 30 Minutes Intravenous  Every 8 hours 11/15/21 0702     11/12/21 2100  cefTRIAXone (ROCEPHIN) 2 g in sodium chloride 0.9 % 100 mL IVPB  Status:  Discontinued        2 g 200 mL/hr over 30 Minutes Intravenous Every 24 hours 11/12/21 1958 11/15/21 0702   11/12/21 2100  azithromycin (ZITHROMAX) tablet 500 mg        500 mg Oral Daily at bedtime 11/12/21 1958 11/14/21 2115        I have personally reviewed the following labs and images: CBC: Recent Labs  Lab 11/12/21 0500 11/13/21 0506 11/14/21 0600 11/15/21 0500 11/16/21 0545  WBC 5.0 2.1* 1.2* 0.6* 0.5*  NEUTROABS 4.3 1.6* 0.8* 0.2* 0.1*  HGB 9.2* 8.7* 11.7* 11.2* 11.2*  HCT 29.4* 27.6* 35.7* 34.3* 35.1*  MCV 93.9 93.2 91.5 93.7 93.6  PLT 103* 79* 46* 22* 13*   BMP &GFR Recent Labs  Lab 11/12/21 0500 11/13/21 0506 11/14/21 0600 11/14/21 0908 11/15/21 0500 11/16/21 0545  NA 132* 132* 134*  --  134* 133*  K 3.7 3.4* 3.2*  --  3.6 3.1*  CL 98 94* 94*  --  95* 91*  CO2 28 31 31   --  32 32  GLUCOSE 135* 120* 128*  --  124* 144*  BUN 16 17 21   --  21 16  CREATININE 0.62 0.66 0.59  --  0.58 0.58  CALCIUM 7.8* 7.5* 7.6*  --  7.8* 7.6*  MG  --   --   --  1.4* 2.1 1.6*  PHOS  --   --   --   --   --  2.8   Estimated Creatinine Clearance: 81.1 mL/min (by C-G formula based on SCr of 0.58 mg/dL). Liver & Pancreas: Recent Labs  Lab 11/12/21 0500 11/13/21 0506 11/14/21 0600 11/15/21 0500 11/16/21 0545  AST 596* 386* 242* 136* 93*  ALT 84* 69* 59* 47* 44  ALKPHOS 347* 310* 278* 248* 221*  BILITOT 2.0* 1.5* 1.5* 1.3* 1.3*  PROT 5.6* 5.6* 6.0* 6.4* 6.2*  ALBUMIN 2.2* 2.2* 2.4* 2.9* 2.7*   No results for input(s): LIPASE, AMYLASE in the last 168 hours. No results for input(s): AMMONIA in the last 168 hours. Diabetic: No results for input(s): HGBA1C in the last 72 hours. Recent Labs  Lab 11/15/21 1152 11/15/21 1731 11/15/21 2146 11/16/21 0737 11/16/21 1213  GLUCAP 238* 142* 257* 149* 262*   Cardiac Enzymes: No results for input(s):  CKTOTAL, CKMB, CKMBINDEX, TROPONINI in the last 168 hours. No results for input(s): PROBNP in the last 8760 hours. Coagulation Profile: No results for input(s): INR, PROTIME in the last 168 hours. Thyroid Function Tests: Recent Labs  11/15/21 0500  TSH 21.290*  FREET4 0.85   Lipid Profile: No results for input(s): CHOL, HDL, LDLCALC, TRIG, CHOLHDL, LDLDIRECT in the last 72 hours. Anemia Panel: No results for input(s): VITAMINB12, FOLATE, FERRITIN, TIBC, IRON, RETICCTPCT in the last 72 hours. Urine analysis:    Component Value Date/Time   COLORURINE AMBER (A) 11/03/2021 2255   APPEARANCEUR HAZY (A) 11/03/2021 2255   LABSPEC 1.014 11/03/2021 2255   PHURINE 5.0 11/03/2021 2255   GLUCOSEU NEGATIVE 11/03/2021 2255   HGBUR NEGATIVE 11/03/2021 2255   BILIRUBINUR NEGATIVE 11/03/2021 2255   KETONESUR 5 (A) 11/03/2021 2255   PROTEINUR NEGATIVE 11/03/2021 2255   NITRITE NEGATIVE 11/03/2021 2255   LEUKOCYTESUR NEGATIVE 11/03/2021 2255   Sepsis Labs: Invalid input(s): PROCALCITONIN, [place]  Microbiology: No results found for this or any previous visit (from the past 240 hour(s)).  Radiology Studies: DG Chest 2 View  Result Date: 11/16/2021 CLINICAL DATA:  Weakness and shortness of breath. History of lung cancer. EXAM: CHEST - 2 VIEW COMPARISON:  Chest x-ray dated November 14, 2021. FINDINGS: Unchanged left upper extremity PICC line. Stable cardiomediastinal silhouette with obscured left heart border. Unchanged small to moderate left pleural effusion with left lower lobe collapse/consolidation. Unchanged trace right pleural effusion and mild right basilar atelectasis. No pneumothorax. No acute osseous abnormality. IMPRESSION: 1. Unchanged bilateral pleural effusions and left lower lobe collapse/consolidation. Electronically Signed   By: Titus Dubin M.D.   On: 11/16/2021 11:31   ECHOCARDIOGRAM COMPLETE  Result Date: 11/15/2021    ECHOCARDIOGRAM REPORT   Patient Name:   Pamela [name]  Date of Exam: 11/15/2021 Medical Rec #:  [id number]    Height:       62.0 in Accession #:    [id number]   Weight:       218.0 lb Date of Birth:  07-20-1961    BSA:          1.983 m Patient Age:    61 years     BP:           126/55 mmHg Patient Gender: F            HR:           96 bpm. Exam Location:  Inpatient Procedure: 2D Echo, Cardiac Doppler and Color Doppler Indications:    Orthopnea  History:        Patient has no prior history of Echocardiogram examinations.                 Signs/Symptoms:Edema; Risk Factors:Diabetes.  Sonographer:    Luisa Hart RDCS Referring Phys: [id number] Charlesetta Ivory [place]  Sonographer Comments: Technically difficult study due to poor echo windows. IMPRESSIONS  1. Left ventricular ejection fraction, by estimation, is 60 to 65%. Left ventricular ejection fraction by 2D MOD biplane is 60.8 %. The left ventricle has normal function. The left ventricle has no regional wall motion abnormalities. There is moderate left ventricular hypertrophy. Left ventricular diastolic parameters are consistent with Grade I diastolic dysfunction (impaired relaxation).  2. Right ventricular systolic function is normal. The right ventricular size is normal. Tricuspid regurgitation signal is inadequate for assessing PA pressure.  3. The mitral valve is grossly normal. No evidence of mitral valve regurgitation.  4. The aortic valve was not well visualized. Aortic valve regurgitation is not visualized.  5. The inferior vena cava is dilated in size with <50% respiratory variability, suggesting right atrial pressure of 15 mmHg. Comparison(s): No prior Echocardiogram. FINDINGS  Left Ventricle: Left ventricular  ejection fraction, by estimation, is 60 to 65%. Left ventricular ejection fraction by 2D MOD biplane is 60.8 %. The left ventricle has normal function. The left ventricle has no regional wall motion abnormalities. The left ventricular internal cavity size was normal in size. There is moderate left ventricular  hypertrophy. Left ventricular diastolic parameters are consistent with Grade I diastolic dysfunction (impaired relaxation). Indeterminate filling pressures. Right Ventricle: The right ventricular size is normal. No increase in right ventricular wall thickness. Right ventricular systolic function is normal. Tricuspid regurgitation signal is inadequate for assessing PA pressure. Left Atrium: Left atrial size was normal in size. Right Atrium: Right atrial size was normal in size. Pericardium: There is no evidence of pericardial effusion. Mitral Valve: The mitral valve is grossly normal. No evidence of mitral valve regurgitation. Tricuspid Valve: The tricuspid valve is normal in structure. Tricuspid valve regurgitation is not demonstrated. Aortic Valve: The aortic valve was not well visualized. Aortic valve regurgitation is not visualized. Aortic valve mean gradient measures 6.0 mmHg. Aortic valve peak gradient measures 9.7 mmHg. Aortic valve area, by VTI measures 3.20 cm. Pulmonic Valve: The pulmonic valve was grossly normal. Pulmonic valve regurgitation is not visualized. Aorta: The aortic root and ascending aorta are structurally normal, with no evidence of dilitation. Venous: The inferior vena cava is dilated in size with less than 50% respiratory variability, suggesting right atrial pressure of 15 mmHg. IAS/Shunts: No atrial level shunt detected by color flow Doppler. Additional Comments: There is pleural effusion in the left lateral region.  LEFT VENTRICLE PLAX 2D                        Biplane EF (MOD) LVIDd:         2.90 cm         LV Biplane EF:   Left LVIDs:         2.90 cm                          ventricular LV PW:         1.40 cm                          ejection LV IVS:        1.60 cm                          fraction by LVOT diam:     2.20 cm                          2D MOD LV SV:         88                               biplane is LV SV Index:   44                               60.8 %. LVOT Area:      3.80 cm                                Diastology  LV e' medial:    6.74 cm/s LV Volumes (MOD)               LV E/e' medial:  14.6 LV vol d, MOD    62.1 ml       LV e' lateral:   9.25 cm/s A2C:                           LV E/e' lateral: 10.6 LV vol d, MOD    40.8 ml A4C: LV vol s, MOD    24.1 ml A2C: LV vol s, MOD    15.1 ml A4C: LV SV MOD A2C:   38.0 ml LV SV MOD A4C:   40.8 ml LV SV MOD BP:    30.7 ml RIGHT VENTRICLE RV Basal diam:  4.10 cm RV Mid diam:    3.70 cm TAPSE (M-mode): 2.2 cm LEFT ATRIUM             Index LA Vol (A2C):   47.7 ml 24.05 ml/m LA Vol (A4C):   36.1 ml 18.20 ml/m LA Biplane Vol: 41.9 ml 21.13 ml/m  AORTIC VALVE                     PULMONIC VALVE AV Area (Vmax):    2.85 cm      PV Vmax:       1.08 m/s AV Area (Vmean):   2.85 cm      PV Vmean:      61.600 cm/s AV Area (VTI):     3.20 cm      PV VTI:        0.184 m AV Vmax:           156.00 cm/s   PV Peak grad:  4.6 mmHg AV Vmean:          113.000 cm/s  PV Mean grad:  2.0 mmHg AV VTI:            0.276 m AV Peak Grad:      9.7 mmHg AV Mean Grad:      6.0 mmHg LVOT Vmax:         117.00 cm/s LVOT Vmean:        84.700 cm/s LVOT VTI:          0.232 m LVOT/AV VTI ratio: 0.84  AORTA Ao Root diam: 2.80 cm MITRAL VALVE MV Area (PHT): 6.71 cm    SHUNTS MV Decel Time: 113 msec    Systemic VTI:  0.23 m MV E velocity: 98.50 cm/s  Systemic Diam: 2.20 cm MV A velocity: 74.70 cm/s MV E/A ratio:  1.32 Lyman Bishop MD Electronically signed by Lyman Bishop MD Signature Date/Time: 11/15/2021/3:33:30 PM    Final       Derisha Funderburke T. Yeager  If 7PM-7AM, please contact night-coverage [email or web address] 11/16/2021, 2:39 PM

## 2021-11-16 NOTE — Assessment & Plan Note (Addendum)
Likely related to lung cancer.  Stable, may be improved on repeat chest x-ray on 3/6. ?-Diuretics as above ?

## 2021-11-16 NOTE — Progress Notes (Signed)
SATURATION QUALIFICATIONS: (This note is used to comply with regulatory documentation for home oxygen) ? ?Patient Saturations on Room Air at Rest = 93% ? ?Patient Saturations on Room Air while Ambulating = 93% ? ?Patient Saturations on 0 Liters of oxygen while Ambulating = 93% ? ?Please briefly explain why patient needs home oxygen: Patient had no drop in O2 saturation while ambulating ?

## 2021-11-16 NOTE — Evaluation (Signed)
Physical Therapy Evaluation ?Patient Details ?Name: Pamela [name] ?MRN: [id number] ?DOB: May 12, 1961 ?Today's Date: 11/16/2021 ? ?History of Present Illness ? Pt is 61 yo female admitted on 11/03/21 with worsening neutropenia, bil LE ?cellulitis, pleural effusion, pancytopenia.  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  Pt also with DM2, HTN, HLD, and hypothroidism.  ?Clinical Impression ? Pt admitted with above diagnosis. At baseline, pt is very independent.  She is currently mostly limited by LE edema and pain.  She was able to ambulate 200' with RW slowly but reports difficulty getting legs into bed. Does present with decreased mobility, endurance, and LE strength.  Will benefit from acute PT but anticipate no skilled PT needs at d/c.  Pt currently with functional limitations due to the deficits listed below (see PT Problem List). Pt will benefit from skilled PT to increase their independence and safety with mobility to allow discharge to the venue listed below.   ?   ?   ? ?Recommendations for follow up therapy are one component of a multi-disciplinary discharge planning process, led by the attending physician.  Recommendations may be updated based on patient status, additional functional criteria and insurance authorization. ? ?Follow Up Recommendations No PT follow up ? ?  ?Assistance Recommended at Discharge PRN  ?Patient can return home with the following ? A little help with walking and/or transfers;A little help with bathing/dressing/bathroom;Help with stairs or ramp for entrance ? ?  ?Equipment Recommendations Rolling walker (2 wheels)  ?Recommendations for Other Services ?    ?  ?Functional Status Assessment Patient has had a recent decline in their functional status and demonstrates the ability to make significant improvements in function in a reasonable and predictable amount of time.  ? ?  ?Precautions / Restrictions Precautions ?Precautions: Fall ?Precaution Comments: Protective Precautions  ? ?   ? ?Mobility ? Bed Mobility ?  ?  ?  ?  ?  ?  ?  ?General bed mobility comments: Inchair at arrival but reports has difficulty getting legs into bed.  Provided gait belt and educated on how to use as leg lifter ?  ? ?Transfers ?Overall transfer level: Needs assistance ?Equipment used: Rolling walker (2 wheels) ?Transfers: Sit to/from Stand ?Sit to Stand: Supervision ?  ?  ?  ?  ?  ?  ?  ? ?Ambulation/Gait ?Ambulation/Gait assistance: Supervision ?Gait Distance (Feet): 200 Feet ?Assistive device: Rolling walker (2 wheels) ?Gait Pattern/deviations: Step-to pattern, Decreased stride length, Shuffle ?Gait velocity: decreased ?  ?  ?General Gait Details: Low foot clearance with slow speed due to LE pain and edema.  Cued for RW proximity ? ?Stairs ?  ?  ?  ?  ?  ? ?Wheelchair Mobility ?  ? ?Modified Rankin (Stroke Patients Only) ?  ? ?  ? ?Balance Overall balance assessment: Needs assistance ?Sitting-balance support: No upper extremity supported ?Sitting balance-Leahy Scale: Good ?  ?  ?Standing balance support: No upper extremity supported ?Standing balance-Leahy Scale: Fair ?Standing balance comment: Can stand and take a few steps wtihout AD; using RW for pain control ?  ?  ?  ?  ?  ?  ?  ?  ?  ?  ?  ?   ? ? ? ?Pertinent Vitals/Pain Pain Assessment ?Pain Assessment: Faces ?Faces Pain Scale: Hurts little more ?Pain Location: Bil feet  ? ? ?Home Living Family/patient expects to be discharged to:: Private residence ?Living Arrangements: Spouse/significant other ?Available Help at Discharge: Family;Available PRN/intermittently ?Type of  Home: House ?Home Access: Stairs to enter ?Entrance Stairs-Rails: Right ?Entrance Stairs-Number of Steps: 2 ?  ?Home Layout: Laundry or work area in basement;Able to live on main level with bedroom/bathroom ?Home Equipment: Shower seat;Hand held shower head ?Additional Comments: Lift recliner  ?  ?Prior Function Prior Level of Function : Driving;Independent/Modified Independent;Needs assist ?   ?  ?  ?  ?  ?  ?Mobility Comments: Can ambulate in community ?ADLs Comments: Does all IADLs and ADLs ?  ? ? ?Hand Dominance  ?   ? ?  ?Extremity/Trunk Assessment  ? Upper Extremity Assessment ?Upper Extremity Assessment: Overall WFL for tasks assessed ?  ? ?Lower Extremity Assessment ?Lower Extremity Assessment: LLE deficits/detail;RLE deficits/detail ?RLE Deficits / Details: Bil LE with significant edema and erythema.  ROM somewhat limited by edema but functional.  MMT ankle and knee 3/5, hip 1/5 - not resisted due to pain ?LLE Deficits / Details: Bil LE with significant edema and erythema.  ROM somewhat limited by edema but functional.  MMT ankle and knee 3/5, hip 1/5 - not resisted due to pain ?  ? ?Cervical / Trunk Assessment ?Cervical / Trunk Assessment: Normal  ?Communication  ?    ?Cognition Arousal/Alertness: Awake/alert ?Behavior During Therapy: [facility] for tasks assessed/performed ?Overall Cognitive Status: Within Functional Limits for tasks assessed ?  ?  ?  ?  ?  ?  ?  ?  ?  ?  ?  ?  ?  ?  ?  ?  ?General Comments: Pt very pleasant and motivated ?  ?  ? ?  ?General Comments General comments (skin integrity, edema, etc.): Educated on elevating legs at rest and AROM as able to assist with edema ? ?  ?Exercises    ? ?Assessment/Plan  ?  ?PT Assessment Patient needs continued PT services  ?PT Problem List Decreased strength;Decreased mobility;Decreased range of motion;Decreased activity tolerance;Cardiopulmonary status limiting activity;Decreased balance;Pain ? ?   ?  ?PT Treatment Interventions DME instruction;Therapeutic activities;Gait training;Therapeutic exercise;Patient/family education;Stair training;Balance training;Functional mobility training   ? ?PT Goals (Current goals can be found in the Care Plan section)  ?Acute Rehab PT Goals ?Patient Stated Goal: return home ?PT Goal Formulation: With patient ?Time For Goal Achievement: 11/30/21 ?Potential to Achieve Goals: Good ? ?  ?Frequency Min 3X/week ?   ? ? ?Co-evaluation   ?  ?  ?  ?  ? ? ?  ?AM-PAC PT "6 Clicks" Mobility  ?Outcome Measure Help needed turning from your back to your side while in a flat bed without using bedrails?: None ?Help needed moving from lying on your back to sitting on the side of a flat bed without using bedrails?: A Little ?Help needed moving to and from a bed to a chair (including a wheelchair)?: A Little ?Help needed standing up from a chair using your arms (e.g., wheelchair or bedside chair)?: A Little ?Help needed to walk in hospital room?: A Little ?Help needed climbing 3-5 steps with a railing? : A Little ?6 Click Score: 19 ? ?  ?End of Session Equipment Utilized During Treatment: Gait belt ?Activity Tolerance: Patient tolerated treatment well ?Patient left: in chair;with call bell/phone within reach ?Nurse Communication: Mobility status ?PT Visit Diagnosis: Other abnormalities of gait and mobility (R26.89);Muscle weakness (generalized) (M62.81) ?  ? ?Time: 6384-5364 ?PT Time Calculation (min) (ACUTE ONLY): 20 min ? ? ?Charges:   PT Evaluation ?$PT Eval Low Complexity: 1 Low ?  ?  ?   ? ? ?Abran Richard, PT ?Acute  Rehab Services ?Pager [phone] ?[facility] [phone] ? ? ?Pamela [name] ?11/16/2021, 3:00 PM ? ?

## 2021-11-16 NOTE — Progress Notes (Signed)
Patient sitting up in recliner,not ready for PICC line dressing change at this time. Notified RN. ?

## 2021-11-17 LAB — CBC WITH DIFFERENTIAL/PLATELET
Abs Immature Granulocytes: 0 10*3/uL (ref 0.00–0.07)
Basophils Absolute: 0 10*3/uL (ref 0.0–0.1)
Basophils Relative: 2 %
Eosinophils Absolute: 0 10*3/uL (ref 0.0–0.5)
Eosinophils Relative: 2 %
HCT: 32.5 % — ABNORMAL LOW (ref 36.0–46.0)
Hemoglobin: 10.6 g/dL — ABNORMAL LOW (ref 12.0–15.0)
Immature Granulocytes: 0 %
Lymphocytes Relative: 60 %
Lymphs Abs: 0.5 10*3/uL — ABNORMAL LOW (ref 0.7–4.0)
MCH: 30.1 pg (ref 26.0–34.0)
MCHC: 32.6 g/dL (ref 30.0–36.0)
MCV: 92.3 fL (ref 80.0–100.0)
Monocytes Absolute: 0.2 10*3/uL (ref 0.1–1.0)
Monocytes Relative: 24 %
Neutro Abs: 0.1 10*3/uL — CL (ref 1.7–7.7)
Neutrophils Relative %: 12 %
Platelets: 8 10*3/uL — CL (ref 150–400)
RBC: 3.52 MIL/uL — ABNORMAL LOW (ref 3.87–5.11)
RDW: 14.4 % (ref 11.5–15.5)
WBC: 0.9 10*3/uL — CL (ref 4.0–10.5)
nRBC: 0 % (ref 0.0–0.2)

## 2021-11-17 LAB — COMPREHENSIVE METABOLIC PANEL
ALT: 41 U/L (ref 0–44)
AST: 68 U/L — ABNORMAL HIGH (ref 15–41)
Albumin: 2.5 g/dL — ABNORMAL LOW (ref 3.5–5.0)
Alkaline Phosphatase: 208 U/L — ABNORMAL HIGH (ref 38–126)
Anion gap: 10 (ref 5–15)
BUN: 18 mg/dL (ref 8–23)
CO2: 32 mmol/L (ref 22–32)
Calcium: 7.6 mg/dL — ABNORMAL LOW (ref 8.9–10.3)
Chloride: 88 mmol/L — ABNORMAL LOW (ref 98–111)
Creatinine, Ser: 0.61 mg/dL (ref 0.44–1.00)
GFR, Estimated: >60 mL/min (ref >60)
Glucose, Bld: 145 mg/dL — ABNORMAL HIGH (ref 70–99)
Potassium: 3.3 mmol/L — ABNORMAL LOW (ref 3.5–5.1)
Sodium: 130 mmol/L — ABNORMAL LOW (ref 135–145)
Total Bilirubin: 1.5 mg/dL — ABNORMAL HIGH (ref 0.3–1.2)
Total Protein: 5.8 g/dL — ABNORMAL LOW (ref 6.5–8.1)

## 2021-11-17 LAB — GLUCOSE, CAPILLARY
Glucose-Capillary: 163 mg/dL — ABNORMAL HIGH (ref 70–99)
Glucose-Capillary: 178 mg/dL — ABNORMAL HIGH (ref 70–99)
Glucose-Capillary: 156 mg/dL — ABNORMAL HIGH (ref 70–99)

## 2021-11-17 LAB — MAGNESIUM: Magnesium: 1.9 mg/dL (ref 1.7–2.4)

## 2021-11-17 LAB — TSH: TSH: 13.863 u[IU]/mL — ABNORMAL HIGH (ref 0.350–4.500)

## 2021-11-17 MED ORDER — POTASSIUM CHLORIDE 10 MEQ/50ML IV SOLN: 10.0000 meq | INTRAVENOUS | Status: DC

## 2021-11-17 MED ORDER — ALBUMIN HUMAN 25 % IV SOLN
50.0000 g | Freq: Once | INTRAVENOUS | Status: AC
  Administered 2021-11-17: 50 g via INTRAVENOUS
  Filled 2021-11-17: qty 200

## 2021-11-17 MED ORDER — POTASSIUM CHLORIDE CRYS ER 20 MEQ PO TBCR
40.0000 meq | EXTENDED_RELEASE_TABLET | ORAL | Status: AC
  Administered 2021-11-17 (×2): 40 meq via ORAL
  Filled 2021-11-17 (×2): qty 2

## 2021-11-17 MED ORDER — DIPHENHYDRAMINE HCL 25 MG PO CAPS
25.0000 mg | ORAL_CAPSULE | Freq: Once | ORAL | Status: AC
  Administered 2021-11-17: 25 mg via ORAL
  Filled 2021-11-17: qty 1

## 2021-11-17 MED ORDER — FUROSEMIDE 10 MG/ML IJ SOLN
60.0000 mg | Freq: Once | INTRAMUSCULAR | Status: AC
  Administered 2021-11-17: 60 mg via INTRAVENOUS
  Filled 2021-11-17: qty 6

## 2021-11-17 MED ORDER — SODIUM CHLORIDE 0.9% IV SOLUTION: Freq: Once | INTRAVENOUS | Status: AC

## 2021-11-17 NOTE — Progress Notes (Signed)
Pamela [name] feels okay.  Her legs still quite swollen.  She says that they do not hurt as much today. ? ?Her platelet count is now 8000.  We will have to give her a platelet transfusion.  Her white cell count is 900.  Hemoglobin 10.6.  We will continue her on the Neupogen.  She is on Merrem for prophylactic coverage. ? ?Her liver tests continue to improve.  Bilirubin is 1.5.  She has normal renal function.  Her calcium is 7.6.  Her potassium is 3.3. ? ?Her appetite has been good.  She has had no mouth sores.  She has had no cough or shortness of breath.  Her chest x-ray looks the same.  I suspect that the primary is probably in the left lower lung. ? ?She has had no fever.  There is no obvious bleeding. ? ?She has had no diarrhea.  There is been no nausea or vomiting. ? ?Her vital signs show temperature 97.5.  Pulse 94.  Blood pressure is 123/36.  Her oral exam shows no mucositis.  Neck is without any adenopathy.  Lungs are with decreased in the left base.  She has no wheezes.  She has good air movement bilaterally.  Cardiac exam regular rate and rhythm.  Abdomen is soft.  Obese.  Good bowel sounds.  Liver edge is at the right costal margin.  Extremities shows the extensive significant lymphedema in the legs.  She has erythema.  She has some blister formation.  Neurological exam shows no focal neurological deficits. ? ?Pamela [name] has extensive stage small cell lung cancer.  She has had 1 cycle of chemotherapy with carboplatinum/VP-16.  She is now pancytopenic.  She is on Neupogen.  She is on Merrem.  We will give her platelets today. ? ?I still suspect that her white cells will be low for a few more days.  I do not think her legs will get much better until her white cell count improves. ? ?At least, she is improved with her renal and liver function very nicely. ? ?I do appreciate the great care she is getting from the staff on 6 E. ? ?[name], MD ? ?Romans 1:16 ?

## 2021-11-17 NOTE — Progress Notes (Signed)
PROGRESS NOTE  Pamela [name] QBH:[id number] DOB: 14-May-1961   PCP: Marinda Elk, MD  Patient is from: Home  DOA: 11/03/2021 LOS: 41  Chief complaints:  Chief Complaint  Patient presents with   abnormal labs     Brief Narrative / Interim history: She14 year old F with recent diagnosis of small cell lung cancer with mets who started chemo on 2/20, DM-2, HTN, HLD and hypothyroidism admitted from oncology office with AKI and concern for TLS.  She completed first cycle of chemotherapy.  She developed pancytopenia.  Started on Granix. Monitoring labs including LFT and LDH due to risk for TLS.  There is also concern about possible pneumonia and BLE cellulitis with significant edema.  TTE with LVEF of 60 to 65%, G1 DD and normal RVSP.  Started on CTX, Zithromax and Lasix.  Metolazone added.  TSH elevated to 20.  Synthroid increased.  Antibiotics escalated to IV meropenem by oncology due to worsening neutropenia.  Also worsening thrombocytopenia.  Received platelet transfusion on 3/4.     Subjective: Seen and examined earlier this morning.  No major events overnight of this morning.  Reports improvement in her breathing and leg pain.  Platelet dropped to 8000.  Oncology ordered platelet transfusion.  Denies melena, hematochezia or bruising.   Objective: Vitals:   11/16/21 2009 11/16/21 2035 11/17/21 0518 11/17/21 0529  BP: (!) 120/51  (!) 123/36   Pulse: (!) 101 (!) 101 94   Resp: 17  18   Temp: (!) 97.5 F (36.4 C)  (!) 97.5 F (36.4 C)   TempSrc: Oral  Oral   SpO2: (!) 88% 90% 94%   Weight:    101.7 kg  Height:        Examination:  GENERAL: No apparent distress.  Nontoxic. HEENT: MMM.  Vision and hearing grossly intact.  NECK: Supple.  No apparent JVD.  RESP: On room air.  No IWOB.  Fair aeration bilaterally. CVS:  RRR. Heart sounds normal.  ABD/GI/GU: BS+. Abd soft, NTND.  MSK/EXT: Prominent BLE edema with erythema.  No increased warmth to touch.  No progression. SKIN:  As above. NEURO: Awake and alert. Oriented appropriately.  No apparent focal neuro deficit. PSYCH: Calm. Normal affect.     Procedures:  None  Microbiology summarized: [id number] and influenza PCR nonreactive.  Assessment and Plan: * Small cell lung cancer ([place]) Diagnosed 2 weeks PTA S/P Liver biopsy 2/13.  Completed chemo inpatient being monitored for TLS and any adverse reaction.   -Continue pain control with fentanyl patch and oxycodone.  -Bowel regimen. -Oncology following- likely needs Port once LUE PICC removed.   Bilateral lower extremity swelling with possible cellulitis Difficult diagnosis to make given edema/possible venous insufficiency.  Edema could be from malignancy as well.  She also have significant erythema.  TSH elevated to 22 but free T4 within normal.  Albumin 2.4.  TTE reassuring.  Reports improvement in her leg pain. -Antibiotics escalated to meropenem on 3/2 given severe neutropenia -On IV Lasix and metolazone-renal function is stable. -Encourage leg elevation. -Fluid restriction, strict I&O and daily weight.  -May benefit from lymphedema clinic  Elevated liver enzymes Recent Labs  Lab 11/13/21 0506 11/14/21 0600 11/15/21 0500 11/16/21 0545 11/17/21 0551  AST 386* 242* 136* 93* 68*  ALT 69* 59* 47* 44 41  ALKPHOS 310* 278* 248* 221* 208*  BILITOT 1.5* 1.5* 1.3* 1.3* 1.5*  PROT 5.6* 6.0* 6.4* 6.2* 5.8*  ALBUMIN 2.2* 2.4* 2.9* 2.7* 2.5*  Improving.  Continue monitoring  Pleural effusion on left Likely related to lung cancer.  Stable on repeat chest x-ray. -Diuretics as above -Thoracocentesis?  Thrombocytopenia might be precluding  Pancytopenia ([place]) Likely due to chemo.  Received 2 units of blood on 2/28.  Hgb improved.  Slight improvement in neutropenia but still low.  Thrombocytopenia worse.  No bleeding.  No signs of bleeding. -Continue Granix per oncology.  -Lovenox discontinued due to severe thrombocytopenia on 3/3. -Platelet  transfusion today, 3/4 -Continue monitoring   Physical deconditioning PT/OT  Pneumonia Ruled out.  CXR finding likely atelectasis.  Procalcitonin might not be reliable in this patient after chemo. -Already completed course of Zithromax -CTX>>> meropenem mainly for cellulitis  Hypokalemia and hypomagnesemia K3.3.  Hypomagnesemia resolved. -P.o. KCl 40x2 -Recheck in the morning  Hyponatremia For to be from hypervolemia.  She could have SIADH from malignancy.  May be hypothyroidism Continue monitoring -Diuretics and Synthroid as above  Hyperuricemia concern/risk for tumor lysis syndrome  Continue monitoring labs.  LDH trending down.  Uric acid down to 4.2.  Renal function stable.   Metabolic acidosis, increased anion gap Likely secondary to AKI.  Resolved.  AKI (acute kidney injury) ([place]) Recent Labs    11/08/21 0530 11/09/21 0532 11/10/21 0500 11/11/21 0559 11/12/21 0500 11/13/21 0506 11/14/21 0600 11/15/21 0500 11/16/21 0545 11/17/21 0551  BUN 36* 33* 25* 19 16 17 21 21 16 18   CREATININE 1.05* 0.95 0.81 0.63 0.62 0.66 0.59 0.58 0.58 0.61  -Continue monitoring while on diuretics   Type 2 diabetes mellitus without complication, without long-term current use of insulin ([facility]) A1c 6.8% in 10/2021. Recent Labs  Lab 11/16/21 1213 11/16/21 1635 11/16/21 2030 11/17/21 0731 11/17/21 1151  GLUCAP 262* 179* 248* 163* 178*  -Continue SSI-moderate. -Continue holding metformin  Essential hypertension Normotensive for most part. -On diuretics.  Chronic anemia Recent Labs    11/08/21 0530 11/09/21 0532 11/10/21 0500 11/11/21 0559 11/12/21 0500 11/13/21 0506 11/14/21 0600 11/15/21 0500 11/16/21 0545 11/17/21 0551  HGB 10.9* 10.7* 10.6* 9.8* 9.2* 8.7* 11.7* 11.2* 11.2* 10.6*  -H&H stable.   Acquired hypothyroidism TSH elevated to 22 but free T4 within normal range at 0.85.  She reports good compliance with her Synthroid.  Although it is difficult to  interpret elevated TSH in acute setting, it is reasonable to increase her Synthroid from 50 to 62.5 mcg -Repeat TSH in 4 to 6 weeks   Increased nutrient needs Body mass index is 41.01 kg/m. Nutrition Problem: Increased nutrient needs Etiology: cancer and cancer related treatments Signs/Symptoms: estimated needs Interventions: Ensure Enlive (each supplement provides 350kcal and 20 grams of protein), MVI   DVT prophylaxis:  Place TED hose Start: 11/05/21 0711 SCDs Start: 11/03/21 1431 Place TED hose Start: 11/03/21 1431  Code Status: Full code Family Communication: Family member at the bedside Level of care: Med-Surg Status is: Inpatient Remains inpatient appropriate because: Pancytopenia and significant BLE edema with possible cellulitis requiring IV antibiotics, IV diuretics and platelet transfusion     Final disposition: Likely home once medically stable.      Consultants:  Oncology   Sch Meds:  Scheduled Meds:  sodium chloride   Intravenous Once   antiseptic oral rinse  15 mL Mouth Rinse Q6H   Chlorhexidine Gluconate Cloth  6 each Topical Daily   diphenhydrAMINE  25 mg Oral Once   feeding supplement  237 mL Oral BID BM   furosemide  40 mg Intravenous BID   furosemide  60 mg Intravenous Once   insulin aspart  0-15 Units Subcutaneous TID WC   levothyroxine  62.5 mcg Oral QAC breakfast   metolazone  5 mg Oral Q12H   multivitamin with minerals  1 tablet Oral Daily   pantoprazole  40 mg Oral BID   polyethylene glycol  17 g Oral Daily   protein supplement  6 g Oral TID WC   senna-docusate  1 tablet Oral BID   sodium chloride flush  10-40 mL Intracatheter Q12H   Tbo-Filgrastim  480 mcg Subcutaneous q1800   Continuous Infusions:  meropenem (MERREM) IV 1 g (11/17/21 0804)   PRN Meds:.acetaminophen, albuterol, alum & mag hydroxide-simeth, heparin lock flush, lidocaine, LORazepam, ondansetron, oxyCODONE, prochlorperazine, senna-docusate, sodium chloride flush,  temazepam  Antimicrobials: Anti-infectives (From admission, onward)    Start     Dose/Rate Route Frequency Ordered Stop   11/15/21 0800  meropenem (MERREM) 1 g in sodium chloride 0.9 % 100 mL IVPB        1 g 200 mL/hr over 30 Minutes Intravenous Every 8 hours 11/15/21 0702     11/12/21 2100  cefTRIAXone (ROCEPHIN) 2 g in sodium chloride 0.9 % 100 mL IVPB  Status:  Discontinued        2 g 200 mL/hr over 30 Minutes Intravenous Every 24 hours 11/12/21 1958 11/15/21 0702   11/12/21 2100  azithromycin (ZITHROMAX) tablet 500 mg        500 mg Oral Daily at bedtime 11/12/21 1958 11/14/21 2115        I have personally reviewed the following labs and images: CBC: Recent Labs  Lab 11/13/21 0506 11/14/21 0600 11/15/21 0500 11/16/21 0545 11/17/21 0551  WBC 2.1* 1.2* 0.6* 0.5* 0.9*  NEUTROABS 1.6* 0.8* 0.2* 0.1* 0.1*  HGB 8.7* 11.7* 11.2* 11.2* 10.6*  HCT 27.6* 35.7* 34.3* 35.1* 32.5*  MCV 93.2 91.5 93.7 93.6 92.3  PLT 79* 46* 22* 13* 8*   BMP &GFR Recent Labs  Lab 11/13/21 0506 11/14/21 0600 11/14/21 0908 11/15/21 0500 11/16/21 0545 11/17/21 0551  NA 132* 134*  --  134* 133* 130*  K 3.4* 3.2*  --  3.6 3.1* 3.3*  CL 94* 94*  --  95* 91* 88*  CO2 31 31  --  32 32 32  GLUCOSE 120* 128*  --  124* 144* 145*  BUN 17 21  --  21 16 18   CREATININE 0.66 0.59  --  0.58 0.58 0.61  CALCIUM 7.5* 7.6*  --  7.8* 7.6* 7.6*  MG  --   --  1.4* 2.1 1.6* 1.9  PHOS  --   --   --   --  2.8  --    Estimated Creatinine Clearance: 82.4 mL/min (by C-G formula based on SCr of 0.61 mg/dL). Liver & Pancreas: Recent Labs  Lab 11/13/21 0506 11/14/21 0600 11/15/21 0500 11/16/21 0545 11/17/21 0551  AST 386* 242* 136* 93* 68*  ALT 69* 59* 47* 44 41  ALKPHOS 310* 278* 248* 221* 208*  BILITOT 1.5* 1.5* 1.3* 1.3* 1.5*  PROT 5.6* 6.0* 6.4* 6.2* 5.8*  ALBUMIN 2.2* 2.4* 2.9* 2.7* 2.5*   No results for input(s): LIPASE, AMYLASE in the last 168 hours. No results for input(s): AMMONIA in the last 168  hours. Diabetic: Recent Labs    11/16/21 1453  HGBA1C 5.8*   Recent Labs  Lab 11/16/21 1213 11/16/21 1635 11/16/21 2030 11/17/21 0731 11/17/21 1151  GLUCAP 262* 179* 248* 163* 178*   Cardiac Enzymes: No results for input(s): CKTOTAL, CKMB, CKMBINDEX, TROPONINI in the last 168  hours. No results for input(s): PROBNP in the last 8760 hours. Coagulation Profile: No results for input(s): INR, PROTIME in the last 168 hours. Thyroid Function Tests: Recent Labs    11/15/21 0500 11/17/21 1022  TSH 21.290* 13.863*  FREET4 0.85  --    Lipid Profile: No results for input(s): CHOL, HDL, LDLCALC, TRIG, CHOLHDL, LDLDIRECT in the last 72 hours. Anemia Panel: No results for input(s): VITAMINB12, FOLATE, FERRITIN, TIBC, IRON, RETICCTPCT in the last 72 hours. Urine analysis:    Component Value Date/Time   COLORURINE AMBER (A) 11/03/2021 2255   APPEARANCEUR HAZY (A) 11/03/2021 2255   LABSPEC 1.014 11/03/2021 2255   PHURINE 5.0 11/03/2021 2255   GLUCOSEU NEGATIVE 11/03/2021 2255   HGBUR NEGATIVE 11/03/2021 2255   BILIRUBINUR NEGATIVE 11/03/2021 2255   KETONESUR 5 (A) 11/03/2021 2255   PROTEINUR NEGATIVE 11/03/2021 2255   NITRITE NEGATIVE 11/03/2021 2255   LEUKOCYTESUR NEGATIVE 11/03/2021 2255   Sepsis Labs: Invalid input(s): PROCALCITONIN, [place]  Microbiology: No results found for this or any previous visit (from the past 240 hour(s)).  Radiology Studies: No results found.    Freeland Pracht T. [place]  If 7PM-7AM, please contact night-coverage [email or web address] 11/17/2021, 12:46 PM

## 2021-11-18 DIAGNOSIS — E039 Hypothyroidism, unspecified: Secondary | ICD-10-CM

## 2021-11-18 DIAGNOSIS — T50905A Adverse effect of unspecified drugs, medicaments and biological substances, initial encounter: Secondary | ICD-10-CM

## 2021-11-18 DIAGNOSIS — S065XAA Traumatic subdural hemorrhage with loss of consciousness status unknown, initial encounter: Secondary | ICD-10-CM

## 2021-11-18 DIAGNOSIS — R748 Abnormal levels of other serum enzymes: Secondary | ICD-10-CM

## 2021-11-18 DIAGNOSIS — D61818 Other pancytopenia: Secondary | ICD-10-CM

## 2021-11-18 DIAGNOSIS — N179 Acute kidney failure, unspecified: Secondary | ICD-10-CM

## 2021-11-18 DIAGNOSIS — E873 Alkalosis: Secondary | ICD-10-CM

## 2021-11-18 DIAGNOSIS — Z6841 Body Mass Index (BMI) 40.0 and over, adult: Secondary | ICD-10-CM

## 2021-11-18 LAB — COMPREHENSIVE METABOLIC PANEL
ALT: 35 U/L (ref 0–44)
AST: 55 U/L — ABNORMAL HIGH (ref 15–41)
Albumin: 3 g/dL — ABNORMAL LOW (ref 3.5–5.0)
Alkaline Phosphatase: 172 U/L — ABNORMAL HIGH (ref 38–126)
Anion gap: 9 (ref 5–15)
BUN: 18 mg/dL (ref 8–23)
CO2: 34 mmol/L — ABNORMAL HIGH (ref 22–32)
Calcium: 7.8 mg/dL — ABNORMAL LOW (ref 8.9–10.3)
Chloride: 85 mmol/L — ABNORMAL LOW (ref 98–111)
Creatinine, Ser: 0.73 mg/dL (ref 0.44–1.00)
GFR, Estimated: >60 mL/min (ref >60)
Glucose, Bld: 158 mg/dL — ABNORMAL HIGH (ref 70–99)
Potassium: 3.1 mmol/L — ABNORMAL LOW (ref 3.5–5.1)
Sodium: 128 mmol/L — ABNORMAL LOW (ref 135–145)
Total Bilirubin: 1.4 mg/dL — ABNORMAL HIGH (ref 0.3–1.2)
Total Protein: 5.9 g/dL — ABNORMAL LOW (ref 6.5–8.1)

## 2021-11-18 LAB — GLUCOSE, CAPILLARY
Glucose-Capillary: 154 mg/dL — ABNORMAL HIGH (ref 70–99)
Glucose-Capillary: 196 mg/dL — ABNORMAL HIGH (ref 70–99)
Glucose-Capillary: 189 mg/dL — ABNORMAL HIGH (ref 70–99)
Glucose-Capillary: 203 mg/dL — ABNORMAL HIGH (ref 70–99)

## 2021-11-18 LAB — CBC WITH DIFFERENTIAL/PLATELET
Abs Immature Granulocytes: 0.28 10*3/uL — ABNORMAL HIGH (ref 0.00–0.07)
Basophils Absolute: 0.1 10*3/uL (ref 0.0–0.1)
Basophils Relative: 2 %
Eosinophils Absolute: 0 10*3/uL (ref 0.0–0.5)
Eosinophils Relative: 1 %
HCT: 31.2 % — ABNORMAL LOW (ref 36.0–46.0)
Hemoglobin: 10.3 g/dL — ABNORMAL LOW (ref 12.0–15.0)
Immature Granulocytes: 9 %
Lymphocytes Relative: 21 %
Lymphs Abs: 0.6 10*3/uL — ABNORMAL LOW (ref 0.7–4.0)
MCH: 30.3 pg (ref 26.0–34.0)
MCHC: 33 g/dL (ref 30.0–36.0)
MCV: 91.8 fL (ref 80.0–100.0)
Monocytes Absolute: 0.6 10*3/uL (ref 0.1–1.0)
Monocytes Relative: 18 %
Neutro Abs: 1.6 10*3/uL — ABNORMAL LOW (ref 1.7–7.7)
Neutrophils Relative %: 49 %
Platelets: 17 10*3/uL — CL (ref 150–400)
RBC: 3.4 MIL/uL — ABNORMAL LOW (ref 3.87–5.11)
RDW: 14.5 % (ref 11.5–15.5)
WBC: 3.1 10*3/uL — ABNORMAL LOW (ref 4.0–10.5)
nRBC: 0 % (ref 0.0–0.2)

## 2021-11-18 LAB — MAGNESIUM: Magnesium: 1.5 mg/dL — ABNORMAL LOW (ref 1.7–2.4)

## 2021-11-18 LAB — LACTATE DEHYDROGENASE: LDH: 584 U/L — ABNORMAL HIGH (ref 98–192)

## 2021-11-18 LAB — PHOSPHORUS: Phosphorus: 2.9 mg/dL (ref 2.5–4.6)

## 2021-11-18 MED ORDER — MAGNESIUM SULFATE 4 GM/100ML IV SOLN
4.0000 g | Freq: Once | INTRAVENOUS | Status: AC
Start: 2021-11-18 — End: 2021-11-18
  Administered 2021-11-18: 4 g via INTRAVENOUS
  Filled 2021-11-18: qty 100

## 2021-11-18 MED ORDER — POTASSIUM CHLORIDE CRYS ER 20 MEQ PO TBCR
40.0000 meq | EXTENDED_RELEASE_TABLET | ORAL | Status: AC
  Administered 2021-11-18 (×3): 40 meq via ORAL
  Filled 2021-11-18 (×3): qty 2

## 2021-11-18 MED ORDER — VANCOMYCIN HCL 2000 MG/400ML IV SOLN
2000.0000 mg | INTRAVENOUS | Status: AC
  Administered 2021-11-18: 2000 mg via INTRAVENOUS
  Filled 2021-11-18: qty 400

## 2021-11-18 MED ORDER — VANCOMYCIN HCL 1250 MG/250ML IV SOLN: 1250.0000 mg | INTRAVENOUS | Status: DC

## 2021-11-18 NOTE — Progress Notes (Addendum)
IP PROGRESS NOTE ?Subjective:  ? ?Pamela [name] is followed by Dr. Marin Olp for management of extensive stage small cell lung cancer.  She completed cycle 1 etoposide/carboplatin beginning 11/05/2021. ?She complains of persistent leg swelling and weeping today.  No other complaint. ? ?Objective: ?Vital signs in last 24 hours: ?Blood pressure (!) 128/57, pulse 90, temperature 98 ?F (36.7 ?C), temperature source Oral, resp. rate 18, height 5\' 2"  (1.575 m), weight 224 lb 3.3 oz (101.7 kg), SpO2 97 %. ? ?Intake/Output from previous day: ?03/04 0701 - 03/05 0700 ?In: 1501.7 [P.O.:965; I.V.:250; Blood:286.7] ?Out: 2 [Urine:2] ? ?Physical Exam: ? ?HEENT: No thrush or ulcers ?Lungs: Decreased breath sounds at the left lower chest, no respiratory distress ?Cardiac: Regular rate and rhythm ?Abdomen: Soft and nontender, no hepatosplenomegaly ?Extremities: Edema throughout the lower leg with stasis change/erythema, blisters, and superficial desquamation ? ? ?Portacath/PICC-without erythema ? ?Lab Results: ?Recent Labs  ?  11/16/21 ?7711 11/17/21 ?6579  ?WBC 0.5* 0.9*  ?HGB 11.2* 10.6*  ?HCT 35.1* 32.5*  ?PLT 13* 8*  ? ? ?BMET ?Recent Labs  ?  11/16/21 ?0383 11/17/21 ?3383  ?NA 133* 130*  ?K 3.1* 3.3*  ?CL 91* 88*  ?CO2 32 32  ?GLUCOSE 144* 145*  ?BUN 16 18  ?CREATININE 0.58 0.61  ?CALCIUM 7.6* 7.6*  ? ? ?Lab Results  ?Component Value Date  ? CEA1 2.6 10/22/2021  ? ? ?Studies/Results: ?DG Chest 2 View ? ?Result Date: 11/16/2021 ?CLINICAL DATA:  Weakness and shortness of breath. History of lung cancer. EXAM: CHEST - 2 VIEW COMPARISON:  Chest x-ray dated November 14, 2021. FINDINGS: Unchanged left upper extremity PICC line. Stable cardiomediastinal silhouette with obscured left heart border. Unchanged small to moderate left pleural effusion with left lower lobe collapse/consolidation. Unchanged trace right pleural effusion and mild right basilar atelectasis. No pneumothorax. No acute osseous abnormality. IMPRESSION: 1. Unchanged bilateral  pleural effusions and left lower lobe collapse/consolidation. Electronically Signed   By: Titus Dubin M.D.   On: 11/16/2021 11:31   ? ?Medications: I have reviewed the patient's current medications. ? ?Assessment/Plan: ?Extensive stage small cell lung cancer, status post cycle 1 etoposide/carboplatin 11/05/2021 ?Pancytopenia secondary to chemotherapy ?Lower extremity edema secondary to liver metastases/hypoalbuminemia ?Diabetes ?Hypokalemia secondary to diuretics ? ?Pamela [name] is now at day 14 following cycle 1 etoposide/carboplatin for treatment of extensive stage small cell lung cancer.  She has persistent pancytopenia secondary to chemotherapy.  She continues G-CSF and antibiotic prophylaxis. ? ?Recommendations: ?Stop G-CSF, neutrophil count improved ?Continue meropenem ?Skin care for the legs ?Replete potassium and magnesium per the medical service ? ? LOS: 15 days  ? ?Betsy Coder, MD  ? ?11/18/2021, 6:19 AM ?  ?

## 2021-11-18 NOTE — Progress Notes (Signed)
Wound care not done, waiting for Wound Nurse to come in today. Night nurse informed the dressing needed to be done. Patient ambulating to the bathroom with standby assist. Family in to visit. ?

## 2021-11-18 NOTE — Progress Notes (Signed)
PROGRESS NOTE  Pamela [name] KGM:[id number] DOB: July 14, 1961   PCP: Marinda Elk, MD  Patient is from: Home  DOA: 11/03/2021 LOS: 42  Chief complaints:  Chief Complaint  Patient presents with   abnormal labs     Brief Narrative / Interim history: She15 year old F with recent diagnosis of small cell lung cancer with mets who started chemo on 2/20, DM-2, HTN, HLD and hypothyroidism admitted from oncology office with AKI and concern for TLS.  She completed first cycle of chemotherapy.  She developed pancytopenia.  Started on Granix. Monitoring labs including LFT and LDH due to risk for TLS.  There is also concern about possible pneumonia and BLE cellulitis with significant edema.  TTE with LVEF of 60 to 65%, G1 DD and normal RVSP.  Started on CTX, Zithromax and Lasix.  Metolazone added.  TSH elevated to 20.  Synthroid increased.  Antibiotics escalated to IV meropenem by oncology due to worsening neutropenia.  Also worsening thrombocytopenia.  Received platelet transfusion on 3/4.   3/5-neutropenia and thrombocytopenia improving.  Worsening BLE edema and erythema with weeping despite meropenem and diuretics.  Sent superficial culture, added vancomycin and consulted ID.     Subjective: Seen and examined earlier this morning.  No major events overnight of this morning.  Reports improvement in her breathing and leg pain.  Platelet dropped to 8000.  Oncology ordered platelet transfusion.  Denies melena, hematochezia or bruising.   Objective: Vitals:   11/17/21 1430 11/17/21 1620 11/18/21 0610 11/18/21 0630  BP: 117/60 111/60 (!) 128/57 (!) 97/48  Pulse: 97 91 90 86  Resp: 16 18    Temp: 98.2 F (36.8 C) 98.6 F (37 C) 98 F (36.7 C) (!) 97.5 F (36.4 C)  TempSrc: Oral Oral Oral Oral  SpO2: 95% 90% 97% 94%  Weight:      Height:        Examination:  GENERAL: No apparent distress.  Nontoxic. HEENT: MMM.  Vision and hearing grossly intact.  NECK: Supple.  No apparent JVD.   RESP:  No IWOB.  Fair aeration bilaterally. CVS:  RRR. Heart sounds normal.  ABD/GI/GU: BS+. Abd soft, NTND.  MSK/EXT: Significant BLE edema with erythema and confluent blisters and discoloration SKIN: As above. NEURO: Awake and alert. Oriented appropriately.  No apparent focal neuro deficit. PSYCH: Calm. Normal affect.    Procedures:  None  Microbiology summarized: [id number] and influenza PCR nonreactive.  Assessment and Plan: * Small cell lung cancer ([place]) Diagnosed 2 weeks PTA S/P Liver biopsy 2/13.  Completed chemo inpatient being monitored for TLS and any adverse reaction.   -Continue pain control with fentanyl patch and oxycodone with bowel regimen. -Oncology following- likely needs Port once LUE PICC removed.   Bilateral lower extremity swelling with possible cellulitis Likely due to poor venous return in the setting of liver metastasis and possible venous insufficiency. She also have significant erythema with worsening blisters.  TSH elevated to 22 but free T4 within normal.  Albumin is okay.  TTE reassuring.  Edema, erythema and weeping worse despite meropenem and diuretics. -CTX 2/27>> escalated to meropenem by oncology on 3/2 due to worsening neutropenia>>  -Vancomycin 3/5>> -Hold metolazone.  Continue IV Lasix -Encourage leg elevation. -ID and WOCN consulted -Fluid restriction, strict I&O and daily weight.   -May benefit from lymphedema clinic  Elevated liver enzymes Recent Labs  Lab 11/13/21 0506 11/14/21 0600 11/15/21 0500 11/16/21 0545 11/17/21 0551  AST 386* 242* 136* 93* 68*  ALT 69* 59* 47*  44 41  ALKPHOS 310* 278* 248* 221* 208*  BILITOT 1.5* 1.5* 1.3* 1.3* 1.5*  PROT 5.6* 6.0* 6.4* 6.2* 5.8*  ALBUMIN 2.2* 2.4* 2.9* 2.7* 2.5*  Improving.  Continue monitoring    Pleural effusion on left Likely related to lung cancer.  Stable on repeat chest x-ray. -Diuretics as above -Thoracocentesis?  Thrombocytopenia might be precluding  Pancytopenia  ([place]) Likely due to chemo.  Received 2 units of blood on 2/28.  Platelet nadir at 8 on 3/4.  Received platelet transfusion.  Pancytopenia improving -Granix discontinued on 3/5. -Lovenox discontinued due to severe thrombocytopenia on 3/3.  -Continue monitoring   Metabolic alkalosis Likely from diuretics. -Discontinued metolazone  Physical deconditioning PT/OT  Pneumonia Ruled out.  CXR finding likely atelectasis.  Procalcitonin might not be reliable in this patient after chemo. -Already completed course of Zithromax -CTX>>> meropenem mainly for cellulitis  Hypokalemia and hypomagnesemia K 3.1.  Mg 1.5. -P.o. KCl 40x3, and IV magnesium sulfate 2 g x 1 -Recheck in the morning  Hyponatremia For to be from hypervolemia.  She could have SIADH from malignancy.  May be hypothyroidism Continue monitoring -Diuretics and Synthroid as above  Hyperuricemia concern/risk for tumor lysis syndrome  Continue monitoring labs.  LDH trending down.  Uric acid down to 4.2.  Renal function stable.   AKI (acute kidney injury) ([place]) Recent Labs    11/09/21 0532 11/10/21 0500 11/11/21 0559 11/12/21 0500 11/13/21 0506 11/14/21 0600 11/15/21 0500 11/16/21 0545 11/17/21 0551 11/18/21 0525  BUN 33* 25* 19 16 17 21 21 16 18 18   CREATININE 0.95 0.81 0.63 0.62 0.66 0.59 0.58 0.58 0.61 0.73  -Discontinue metolazone -Closely monitor while on Lasix and vancomycin   Type 2 diabetes mellitus without complication, without long-term current use of insulin (H[facility]) A1c 6.8% in 10/2021. Recent Labs  Lab 11/17/21 0731 11/17/21 1151 11/17/21 1704 11/18/21 0737 11/18/21 1117  GLUCAP 163* 178* 156* 154* 196*  -Continue SSI-moderate. -Add basal insulin at 5 units daily -Continue holding metformin  Essential hypertension Normotensive for most part. -On diuretics.  Chronic anemia Recent Labs    11/09/21 0532 11/10/21 0500 11/11/21 0559 11/12/21 0500 11/13/21 0506 11/14/21 0600 11/15/21 0500  11/16/21 0545 11/17/21 0551 11/18/21 0525  HGB 10.7* 10.6* 9.8* 9.2* 8.7* 11.7* 11.2* 11.2* 10.6* 10.3*  -H&H stable.   Acquired hypothyroidism TSH elevated to 22 but free T4 within normal range at 0.85.  She reports good compliance with her Synthroid.  Although it is difficult to interpret elevated TSH in acute setting, it is reasonable to increase her Synthroid from 50 to 62.5 mcg -Repeat TSH in 4 to 6 weeks   Increased nutrient needs Body mass index is 41.01 kg/m. Nutrition Problem: Increased nutrient needs Etiology: cancer and cancer related treatments Signs/Symptoms: estimated needs Interventions: Ensure Enlive (each supplement provides 350kcal and 20 grams of protein), MVI   DVT prophylaxis:  Place TED hose Start: 11/05/21 0711 SCDs Start: 11/03/21 1431 Place TED hose Start: 11/03/21 1431  Code Status: Full code Family Communication: Family member at the bedside Level of care: Med-Surg Status is: Inpatient Remains inpatient appropriate because: Pancytopenia and significant BLE edema with possible cellulitis requiring IV antibiotics, IV diuretics      Final disposition: Likely home once medically stable.      Consultants:  Oncology Infectious disease   Sch Meds:  Scheduled Meds:  antiseptic oral rinse  15 mL Mouth Rinse Q6H   Chlorhexidine Gluconate Cloth  6 each Topical Daily   feeding supplement  237 mL Oral BID BM   furosemide  40 mg Intravenous BID   insulin aspart  0-15 Units Subcutaneous TID WC   levothyroxine  62.5 mcg Oral QAC breakfast   multivitamin with minerals  1 tablet Oral Daily   pantoprazole  40 mg Oral BID   polyethylene glycol  17 g Oral Daily   potassium chloride  40 mEq Oral Q3H   protein supplement  6 g Oral TID WC   senna-docusate  1 tablet Oral BID   sodium chloride flush  10-40 mL Intracatheter Q12H   Continuous Infusions:  magnesium sulfate bolus IVPB 4 g (11/18/21 1029)   meropenem (MERREM) IV 1 g (11/18/21 0808)    [START ON 11/19/2021] vancomycin     vancomycin     PRN Meds:.acetaminophen, albuterol, alum & mag hydroxide-simeth, heparin lock flush, lidocaine, LORazepam, ondansetron, oxyCODONE, prochlorperazine, senna-docusate, sodium chloride flush, temazepam  Antimicrobials: Anti-infectives (From admission, onward)    Start     Dose/Rate Route Frequency Ordered Stop   11/19/21 1200  vancomycin (VANCOREADY) IVPB 1250 mg/250 mL        1,250 mg 166.7 mL/hr over 90 Minutes Intravenous Every 24 hours 11/18/21 1120     11/18/21 1015  vancomycin (VANCOREADY) IVPB 2000 mg/400 mL        2,000 mg 200 mL/hr over 120 Minutes Intravenous STAT 11/18/21 0922 11/19/21 1015   11/15/21 0800  meropenem (MERREM) 1 g in sodium chloride 0.9 % 100 mL IVPB        1 g 200 mL/hr over 30 Minutes Intravenous Every 8 hours 11/15/21 0702     11/12/21 2100  cefTRIAXone (ROCEPHIN) 2 g in sodium chloride 0.9 % 100 mL IVPB  Status:  Discontinued        2 g 200 mL/hr over 30 Minutes Intravenous Every 24 hours 11/12/21 1958 11/15/21 0702   11/12/21 2100  azithromycin (ZITHROMAX) tablet 500 mg        500 mg Oral Daily at bedtime 11/12/21 1958 11/14/21 2115        I have personally reviewed the following labs and images: CBC: Recent Labs  Lab 11/14/21 0600 11/15/21 0500 11/16/21 0545 11/17/21 0551 11/18/21 0525  WBC 1.2* 0.6* 0.5* 0.9* 3.1*  NEUTROABS 0.8* 0.2* 0.1* 0.1* 1.6*  HGB 11.7* 11.2* 11.2* 10.6* 10.3*  HCT 35.7* 34.3* 35.1* 32.5* 31.2*  MCV 91.5 93.7 93.6 92.3 91.8  PLT 46* 22* 13* 8* 17*   BMP &GFR Recent Labs  Lab 11/14/21 0600 11/14/21 0908 11/15/21 0500 11/16/21 0545 11/17/21 0551 11/18/21 0525  NA 134*  --  134* 133* 130* 128*  K 3.2*  --  3.6 3.1* 3.3* 3.1*  CL 94*  --  95* 91* 88* 85*  CO2 31  --  32 32 32 34*  GLUCOSE 128*  --  124* 144* 145* 158*  BUN 21  --  21 16 18 18   CREATININE 0.59  --  0.58 0.58 0.61 0.73  CALCIUM 7.6*  --  7.8* 7.6* 7.6* 7.8*  MG  --  1.4* 2.1 1.6* 1.9 1.5*   PHOS  --   --   --  2.8  --  2.9   Estimated Creatinine Clearance: 82.4 mL/min (by C-G formula based on SCr of 0.73 mg/dL). Liver & Pancreas: Recent Labs  Lab 11/14/21 0600 11/15/21 0500 11/16/21 0545 11/17/21 0551 11/18/21 0525  AST 242* 136* 93* 68* 55*  ALT 59* 47* 44 41 35  ALKPHOS 278* 248* 221* 208* 172*  BILITOT 1.5* 1.3* 1.3* 1.5* 1.4*  PROT 6.0* 6.4* 6.2* 5.8* 5.9*  ALBUMIN 2.4* 2.9* 2.7* 2.5* 3.0*   No results for input(s): LIPASE, AMYLASE in the last 168 hours. No results for input(s): AMMONIA in the last 168 hours. Diabetic: Recent Labs    11/16/21 1453  HGBA1C 5.8*   Recent Labs  Lab 11/17/21 0731 11/17/21 1151 11/17/21 1704 11/18/21 0737 11/18/21 1117  GLUCAP 163* 178* 156* 154* 196*   Cardiac Enzymes: No results for input(s): CKTOTAL, CKMB, CKMBINDEX, TROPONINI in the last 168 hours. No results for input(s): PROBNP in the last 8760 hours. Coagulation Profile: No results for input(s): INR, PROTIME in the last 168 hours. Thyroid Function Tests: Recent Labs    11/17/21 1022  TSH 13.863*   Lipid Profile: No results for input(s): CHOL, HDL, LDLCALC, TRIG, CHOLHDL, LDLDIRECT in the last 72 hours. Anemia Panel: No results for input(s): VITAMINB12, FOLATE, FERRITIN, TIBC, IRON, RETICCTPCT in the last 72 hours. Urine analysis:    Component Value Date/Time   COLORURINE AMBER (A) 11/03/2021 2255   APPEARANCEUR HAZY (A) 11/03/2021 2255   LABSPEC 1.014 11/03/2021 2255   PHURINE 5.0 11/03/2021 2255   GLUCOSEU NEGATIVE 11/03/2021 2255   HGBUR NEGATIVE 11/03/2021 2255   BILIRUBINUR NEGATIVE 11/03/2021 2255   KETONESUR 5 (A) 11/03/2021 2255   PROTEINUR NEGATIVE 11/03/2021 2255   NITRITE NEGATIVE 11/03/2021 2255   LEUKOCYTESUR NEGATIVE 11/03/2021 2255   Sepsis Labs: Invalid input(s): PROCALCITONIN, [place]  Microbiology: No results found for this or any previous visit (from the past 240 hour(s)).  Radiology Studies: No results  found.    Jahyra Sukup T. [place]  If 7PM-7AM, please contact night-coverage [email or web address] 11/18/2021, 12:23 PM

## 2021-11-18 NOTE — Progress Notes (Signed)
Pharmacy Antibiotic Note [name] is a 61 y.o. female with small cell lung cancer admitted on 11/03/2021 with bilateral lower extremity swelling with possible cellulitis. Antibiotics escalated to meropenem on 11/15/2021 give severe neutropenia. Today, pharmacy has been consulted for Vancomycin dosing for cellulitis.  ? ?Plan: ?Vancomycin 2g IV x 1, then 1250mg  IV q24h to keep AUC 400-550 ?Vancomycin levels at steady state, as indicated ?Meropenem per MD ?Monitor renal function, cultures, clinical course, duration of therapy  ? ?Height: 5\' 2"  (157.5 cm) ?Weight: 101.7 kg (224 lb 3.3 oz) ?IBW/kg (Calculated) : 50.1 ? ?Temp (24hrs), Avg:98.1 ?F (36.7 ?C), Min:97.5 ?F (36.4 ?C), Max:98.6 ?F (37 ?C) ? ?Recent Labs  ?Lab 11/14/21 ?0600 11/15/21 ?0500 11/16/21 ?4854 11/17/21 ?6270 11/18/21 ?3500  ?WBC 1.2* 0.6* 0.5* 0.9* 3.1*  ?CREATININE 0.59 0.58 0.58 0.61 0.73  ?  ?Estimated Creatinine Clearance: 82.4 mL/min (by C-G formula based on SCr of 0.73 mg/dL).   ? ?Allergies  ?Allergen Reactions  ? Rofecoxib Swelling  ? ? ?Antimicrobials this admission: ?2/27 Ceftriaxone >> 3/2 ?2/27 Azithromycin >> 3/1 ?3/2 Meropenem >> ?3/5 Vancomycin >>  ? ?Dose adjustments this admission: ?-- ? ?Microbiology results: ?3/5 right leg superficial specimen:  ? ?Thank you for allowing pharmacy to be a part of this patientPamelas care. ? ? ?Pamela [name], PharmD, BCPS ?Clinical Pharmacist  ?11/18/2021 9:23 AM ? ?

## 2021-11-18 NOTE — Consult Note (Signed)
Date of Admission:  11/03/2021          Reason for Consult: Bilateral lower extremity and erythema being rx as cellultiis    Referring Provider: Wendee Beavers, MD   Assessment:  Symmetric bilateral erythema of lower extremities that occurred while the patient was in the hospital receiving chemotherapy and while she was receiving antibiotics for "pneumonia" in form of ceftriaxone and azithromycin, with no fever or other symptom suggestive of infection in patient who has not had cellulitis before and not been immunosuppressed within recent history prior to her chemotherapy = likely an adverse drug reaction, less likely to be 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  Dr Candiss Norse will check on the patient tomorrow.  Principal Problem:   Small cell lung cancer (HCC) Active Problems:   Acquired hypothyroidism   Chronic anemia   Essential hypertension   Type 2 diabetes mellitus without complication, without long-term current use of insulin (HCC)   AKI (acute kidney injury) ([place])   Hyperuricemia concern/risk for tumor lysis syndrome    Elevated liver enzymes   Hyponatremia   Pancytopenia (HCC)   Hypokalemia and hypomagnesemia   Bilateral lower extremity swelling with possible cellulitis   Pneumonia   Physical deconditioning   Pleural effusion on left   Metabolic alkalosis   Scheduled Meds:  antiseptic oral rinse  15 mL Mouth Rinse Q6H   Chlorhexidine Gluconate Cloth  6 each Topical Daily    feeding supplement  237 mL Oral BID BM   furosemide  40 mg Intravenous BID   insulin aspart  0-15 Units Subcutaneous TID WC   levothyroxine  62.5 mcg Oral QAC breakfast   multivitamin with minerals  1 tablet Oral Daily   pantoprazole  40 mg Oral BID   polyethylene glycol  17 g Oral Daily   protein supplement  6 g Oral TID WC   senna-docusate  1 tablet Oral BID   sodium chloride flush  10-40 mL Intracatheter Q12H   Continuous Infusions: PRN Meds:.acetaminophen, albuterol, alum & mag hydroxide-simeth, heparin lock flush, lidocaine, LORazepam, ondansetron, oxyCODONE, prochlorperazine, senna-docusate, sodium chloride flush, temazepam  HPI: Pamela [name] is a 61 y.o. female with past medical history significant for uterine and colon cancer that was cured, type 2 diabetes mellitus hypertension hyperlipidemia hypothyroidism who was diagnosed with extensive small cell lung cancer early February.  Was found on labs and oncology to have acute renal failure with transaminitis.  There is concern for tumor lysis syndrome and need to treat this and monitor her while she is being treated for chemotherapy.  Therefore she was admitted to the hospital at [facility] long November 03, 2021.  Note on admission she states that she had developed worsening lower extremity edema after she says that she was asked to " stop my blood pressure pill" which is lisinopril hydrochlorothiazide.  She did not however have any erythema in her lower extremities at that time whatsoever.  She was given IV fluid in the context of her acute renal failure and the concern for tumor lysis syndrome.  In the interim developed worsening lower extremity edema as well as bilateral pleural effusions left greater than right with some collapse of left lower lobe.  She was found on MRI of the brain to have a subdural hematoma as well as a C4 hypodense lesion of uncertain etiology.    She was started on chemotherapy in the form of etoposide  and carboplatin.  Afebrile the 27th she was developing progressive pancytopenia and she was also initiated on ceftriaxone azithromycin for pneumonia.  In the interim she had developed worsening erythema of her lower extremities that is quite symmetric and intense with pain in her legs making it difficult to walk.  She was then switched over to meropenem on 2 March in context of worsening neutropenia and concern that her lower extremity erythema was cellulitis.  Her erythema however has not improved whatsoever.  The patient had 6 days of antibiotics total with 3 days of ceftriaxone azithromycin and 3 days of meropenem.  Vancomycin was ordered today.  I would find it highly unusual for any patient to develop cellulitis in the hospital without a clear-cut offending event such as a infected peripheral IV or surgical site infection.  If she had spent time in contaminated water such as a hot tub or something like that I could believe the idea of her having bilateral lower extremity cellulitis.  However there is no event such as that that is happened.   He is now also received Granix and her white count is now climbing given    Given the symmetric nature of her erythema and his failure to respond to antibiotics I suspect that this could be an adverse drug reaction possibly to her chemotherapy since these are new drugs. A neutrophilic dermatosis in the context of Granix is also certainly a possibility I do not think the timing lines up with it.  I am stopping antibiotics.  We will observe her off antibiotics.  One could consider a biopsy performed by general surgery that can be sent to pathology.  Fortunately we do not have dermatology inpatient consultative services at [place].  If this is desired would transfer her to tertiary care hospital.   I would also consider a trial of steroids.  Would defer to Hematology/Oncology and Pharmacy re ? Which drugs may have caused her intense  bilateral erythema and weeping edema,  I spent 112 minutes with the patient including than 50% of the time in face to face counseling of the patient husband regarding the nature of cellulitis versus adverse drug reactions, personally reviewing CT scans MRIs CBC CMP along with review of medical records in preparation for the visit and during the visit and in coordination of her care.   Review of Systems: Review of Systems  Constitutional:  Positive for malaise/fatigue. Negative for chills, diaphoresis, fever and weight loss.  HENT:  Negative for congestion, hearing loss, sore throat and tinnitus.   Eyes:  Negative for blurred vision and double vision.  Respiratory:  Negative for cough, sputum production, shortness of breath and wheezing.   Cardiovascular:  Negative for chest pain, palpitations and leg swelling.  Gastrointestinal:  Negative for abdominal pain, blood in stool, constipation, diarrhea, heartburn, melena, nausea and vomiting.  Genitourinary:  Negative for dysuria, flank pain and hematuria.  Musculoskeletal:  Positive for joint pain and myalgias. Negative for back pain and falls.  Skin:  Negative for itching and rash.  Neurological:  Positive for weakness and headaches. Negative for dizziness, sensory change, focal weakness and  loss of consciousness.  Endo/Heme/Allergies:  Does not bruise/bleed easily.  Psychiatric/Behavioral:  Negative for depression, memory loss and suicidal ideas. The patient is not nervous/anxious.    Past Medical History:  Diagnosis Date   Colon cancer ([place]) 2003   Diabetes mellitus without complication ([place])    Hypertension    Personal history of chemotherapy 2003   colon cancer   Uterine cancer ([place]) 2007    Social History   Tobacco Use   Smoking status: Former    Packs/day: 1.00    Years: 43.00    Pack years: 43.00    Types: Cigarettes    Quit date: 10/17/2021    Years since quitting: 0.0   Smokeless tobacco: Never   Tobacco comments:    1 to  2 cigarettes per day now  Vaping Use   Vaping Use: Never used  Substance Use Topics   Alcohol use: Not Currently   Drug use: Never    Family History  Problem Relation Age of Onset   Breast cancer Maternal Aunt 60   Breast cancer Paternal Aunt 57   Breast cancer Cousin 18   Breast cancer Cousin 32   Allergies  Allergen Reactions   Rofecoxib Swelling    OBJECTIVE: Blood pressure (!) 117/47, pulse 100, temperature 97.7 F (36.5 C), temperature source Oral, resp. rate 18, height 5\' 2"  (1.575 m), weight 101.7 kg, SpO2 91 %.  Physical Exam Constitutional:      General: She is not in acute distress.    Appearance: Normal appearance. She is well-developed. She is obese. She is not ill-appearing or diaphoretic.  HENT:     Head: Normocephalic and atraumatic.     Right Ear: Hearing and external ear normal.     Left Ear: Hearing and external ear normal.     Nose: No nasal deformity or rhinorrhea.  Eyes:     General: No scleral icterus.    Conjunctiva/sclera: Conjunctivae normal.     Right eye: Right conjunctiva is not injected.     Left eye: Left conjunctiva is not injected.     Pupils: Pupils are equal, round, and reactive to light.  Neck:     Vascular: No JVD.  Cardiovascular:     Rate and Rhythm: Normal rate and regular rhythm.     Heart sounds: Normal heart sounds, S1 normal and S2 normal. No murmur heard.   No friction rub.  Abdominal:     General: Bowel sounds are normal. There is no distension.     Palpations: Abdomen is soft.     Tenderness: There is no abdominal tenderness.  Musculoskeletal:        General: Normal range of motion.     Right shoulder: Normal.     Left shoulder: Normal.     Cervical back: Normal range of motion and neck supple.     Right hip: Normal.     Left hip: Normal.     Right knee: Normal.     Left knee: Normal.  Lymphadenopathy:     Head:     Right side of head: No submandibular, preauricular or posterior auricular adenopathy.     Left  side of head: No submandibular, preauricular or posterior auricular adenopathy.     Cervical: No cervical adenopathy.     Right cervical: No superficial or deep cervical adenopathy.    Left cervical: No superficial or deep cervical adenopathy.  Skin:    Findings: Erythema and rash present. No abrasion or ecchymosis.  Nails: There is no clubbing.  Neurological:     Mental Status: She is alert and oriented to person, place, and time.     Sensory: No sensory deficit.     Coordination: Coordination normal.     Gait: Gait normal.  Psychiatric:        Attention and Perception: She is attentive.        Mood and Affect: Mood normal.        Speech: Speech normal.        Behavior: Behavior normal. Behavior is cooperative.        Thought Content: Thought content normal.        Judgment: Judgment normal.   Symmetric erythema and edema with now some of areas blistering and bursting  11/18/2021:          Lab Results Lab Results  Component Value Date   WBC 3.1 (L) 11/18/2021   HGB 10.3 (L) 11/18/2021   HCT 31.2 (L) 11/18/2021   MCV 91.8 11/18/2021   PLT 17 (LL) 11/18/2021    Lab Results  Component Value Date   CREATININE 0.73 11/18/2021   BUN 18 11/18/2021   NA 128 (L) 11/18/2021   K 3.1 (L) 11/18/2021   CL 85 (L) 11/18/2021   CO2 34 (H) 11/18/2021    Lab Results  Component Value Date   ALT 35 11/18/2021   AST 55 (H) 11/18/2021   ALKPHOS 172 (H) 11/18/2021   BILITOT 1.4 (H) 11/18/2021     Microbiology: No results found for this or any previous visit (from the past 240 hour(s)).  Alcide Evener, [facility] [phone] pager  11/18/2021, 4:49 PM

## 2021-11-18 NOTE — Progress Notes (Signed)
Bed time blood sugar was 173, it did not communicate to system. ?

## 2021-11-18 NOTE — Assessment & Plan Note (Signed)
Likely from diuretics. ?-Discontinued metolazone ?

## 2021-11-19 ENCOUNTER — Ambulatory Visit: Payer: BC Managed Care – PPO | Admitting: Oncology

## 2021-11-19 ENCOUNTER — Inpatient Hospital Stay (HOSPITAL_COMMUNITY): Payer: BC Managed Care – PPO

## 2021-11-19 ENCOUNTER — Other Ambulatory Visit: Payer: BC Managed Care – PPO

## 2021-11-19 ENCOUNTER — Ambulatory Visit: Payer: BC Managed Care – PPO

## 2021-11-19 DIAGNOSIS — R600 Localized edema: Secondary | ICD-10-CM

## 2021-11-19 DIAGNOSIS — C787 Secondary malignant neoplasm of liver and intrahepatic bile duct: Secondary | ICD-10-CM

## 2021-11-19 DIAGNOSIS — E873 Alkalosis: Secondary | ICD-10-CM

## 2021-11-19 LAB — CBC WITH DIFFERENTIAL/PLATELET
Abs Immature Granulocytes: 0.02 10*3/uL (ref 0.00–0.07)
Abs Immature Granulocytes: 1.4 10*3/uL — ABNORMAL HIGH (ref 0.00–0.07)
Band Neutrophils: 4 %
Basophils Absolute: 0 10*3/uL (ref 0.0–0.1)
Basophils Absolute: 0 10*3/uL (ref 0.0–0.1)
Basophils Relative: 2 %
Basophils Relative: 0 %
Eosinophils Absolute: 0 10*3/uL (ref 0.0–0.5)
Eosinophils Absolute: 0.2 10*3/uL (ref 0.0–0.5)
Eosinophils Relative: 2 %
Eosinophils Relative: 2 %
HCT: 34.3 % — ABNORMAL LOW (ref 36.0–46.0)
HCT: 32.2 % — ABNORMAL LOW (ref 36.0–46.0)
Hemoglobin: 11.2 g/dL — ABNORMAL LOW (ref 12.0–15.0)
Hemoglobin: 10.5 g/dL — ABNORMAL LOW (ref 12.0–15.0)
Immature Granulocytes: 4 %
Lymphocytes Relative: 63 %
Lymphocytes Relative: 15 %
Lymphs Abs: 0.4 10*3/uL — ABNORMAL LOW (ref 0.7–4.0)
Lymphs Abs: 1.5 10*3/uL (ref 0.7–4.0)
MCH: 30.6 pg (ref 26.0–34.0)
MCH: 29.9 pg (ref 26.0–34.0)
MCHC: 32.7 g/dL (ref 30.0–36.0)
MCHC: 32.6 g/dL (ref 30.0–36.0)
MCV: 93.7 fL (ref 80.0–100.0)
MCV: 91.7 fL (ref 80.0–100.0)
Metamyelocytes Relative: 2 %
Monocytes Absolute: 0 10*3/uL — ABNORMAL LOW (ref 0.1–1.0)
Monocytes Absolute: 1 10*3/uL (ref 0.1–1.0)
Monocytes Relative: 2 %
Monocytes Relative: 10 %
Myelocytes: 9 %
Neutro Abs: 0.2 10*3/uL — CL (ref 1.7–7.7)
Neutro Abs: 6 10*3/uL (ref 1.7–7.7)
Neutrophils Relative %: 27 %
Neutrophils Relative %: 54 %
Other: 1 %
Platelets: 22 10*3/uL — CL (ref 150–400)
Platelets: 43 10*3/uL — ABNORMAL LOW (ref 150–400)
Promyelocytes Relative: 3 %
RBC: 3.66 MIL/uL — ABNORMAL LOW (ref 3.87–5.11)
RBC: 3.51 MIL/uL — ABNORMAL LOW (ref 3.87–5.11)
RDW: 14.7 % (ref 11.5–15.5)
RDW: 14.4 % (ref 11.5–15.5)
WBC: 0.6 10*3/uL — CL (ref 4.0–10.5)
WBC: 10.3 10*3/uL (ref 4.0–10.5)
nRBC: 0 % (ref 0.0–0.2)
nRBC: 0.2 % (ref 0.0–0.2)

## 2021-11-19 LAB — BPAM PLATELET PHERESIS
Blood Product Expiration Date: 202303062359
ISSUE DATE / TIME: 202303041414
Unit Type and Rh: 5100

## 2021-11-19 LAB — COMPREHENSIVE METABOLIC PANEL
ALT: 37 U/L (ref 0–44)
AST: 61 U/L — ABNORMAL HIGH (ref 15–41)
Albumin: 3 g/dL — ABNORMAL LOW (ref 3.5–5.0)
Alkaline Phosphatase: 215 U/L — ABNORMAL HIGH (ref 38–126)
Anion gap: 9 (ref 5–15)
BUN: 19 mg/dL (ref 8–23)
CO2: 35 mmol/L — ABNORMAL HIGH (ref 22–32)
Calcium: 8.2 mg/dL — ABNORMAL LOW (ref 8.9–10.3)
Chloride: 86 mmol/L — ABNORMAL LOW (ref 98–111)
Creatinine, Ser: 0.8 mg/dL (ref 0.44–1.00)
GFR, Estimated: >60 mL/min (ref >60)
Glucose, Bld: 239 mg/dL — ABNORMAL HIGH (ref 70–99)
Potassium: 3.4 mmol/L — ABNORMAL LOW (ref 3.5–5.1)
Sodium: 130 mmol/L — ABNORMAL LOW (ref 135–145)
Total Bilirubin: 1.2 mg/dL (ref 0.3–1.2)
Total Protein: 6.2 g/dL — ABNORMAL LOW (ref 6.5–8.1)

## 2021-11-19 LAB — PREPARE PLATELET PHERESIS: Unit division: 0

## 2021-11-19 LAB — GLUCOSE, CAPILLARY
Glucose-Capillary: 173 mg/dL — ABNORMAL HIGH (ref 70–99)
Glucose-Capillary: 168 mg/dL — ABNORMAL HIGH (ref 70–99)
Glucose-Capillary: 216 mg/dL — ABNORMAL HIGH (ref 70–99)
Glucose-Capillary: 165 mg/dL — ABNORMAL HIGH (ref 70–99)
Glucose-Capillary: 194 mg/dL — ABNORMAL HIGH (ref 70–99)
Glucose-Capillary: 198 mg/dL — ABNORMAL HIGH (ref 70–99)

## 2021-11-19 LAB — LACTATE DEHYDROGENASE: LDH: 697 U/L — ABNORMAL HIGH (ref 98–192)

## 2021-11-19 LAB — PHOSPHORUS: Phosphorus: 2.8 mg/dL (ref 2.5–4.6)

## 2021-11-19 LAB — CREATININE, SERUM
Creatinine, Ser: 0.67 mg/dL (ref 0.44–1.00)
GFR, Estimated: >60 mL/min (ref >60)

## 2021-11-19 LAB — MAGNESIUM: Magnesium: 2 mg/dL (ref 1.7–2.4)

## 2021-11-19 IMAGING — DX DG CHEST 2V
2 series · 2 of 2 positions shown · non-contrast
Comparison: Chest x-ray dated [DATE].

CLINICAL DATA: Pleural effusion.  History of lung cancer.

EXAM:
CHEST - 2 VIEW

[chest pa]
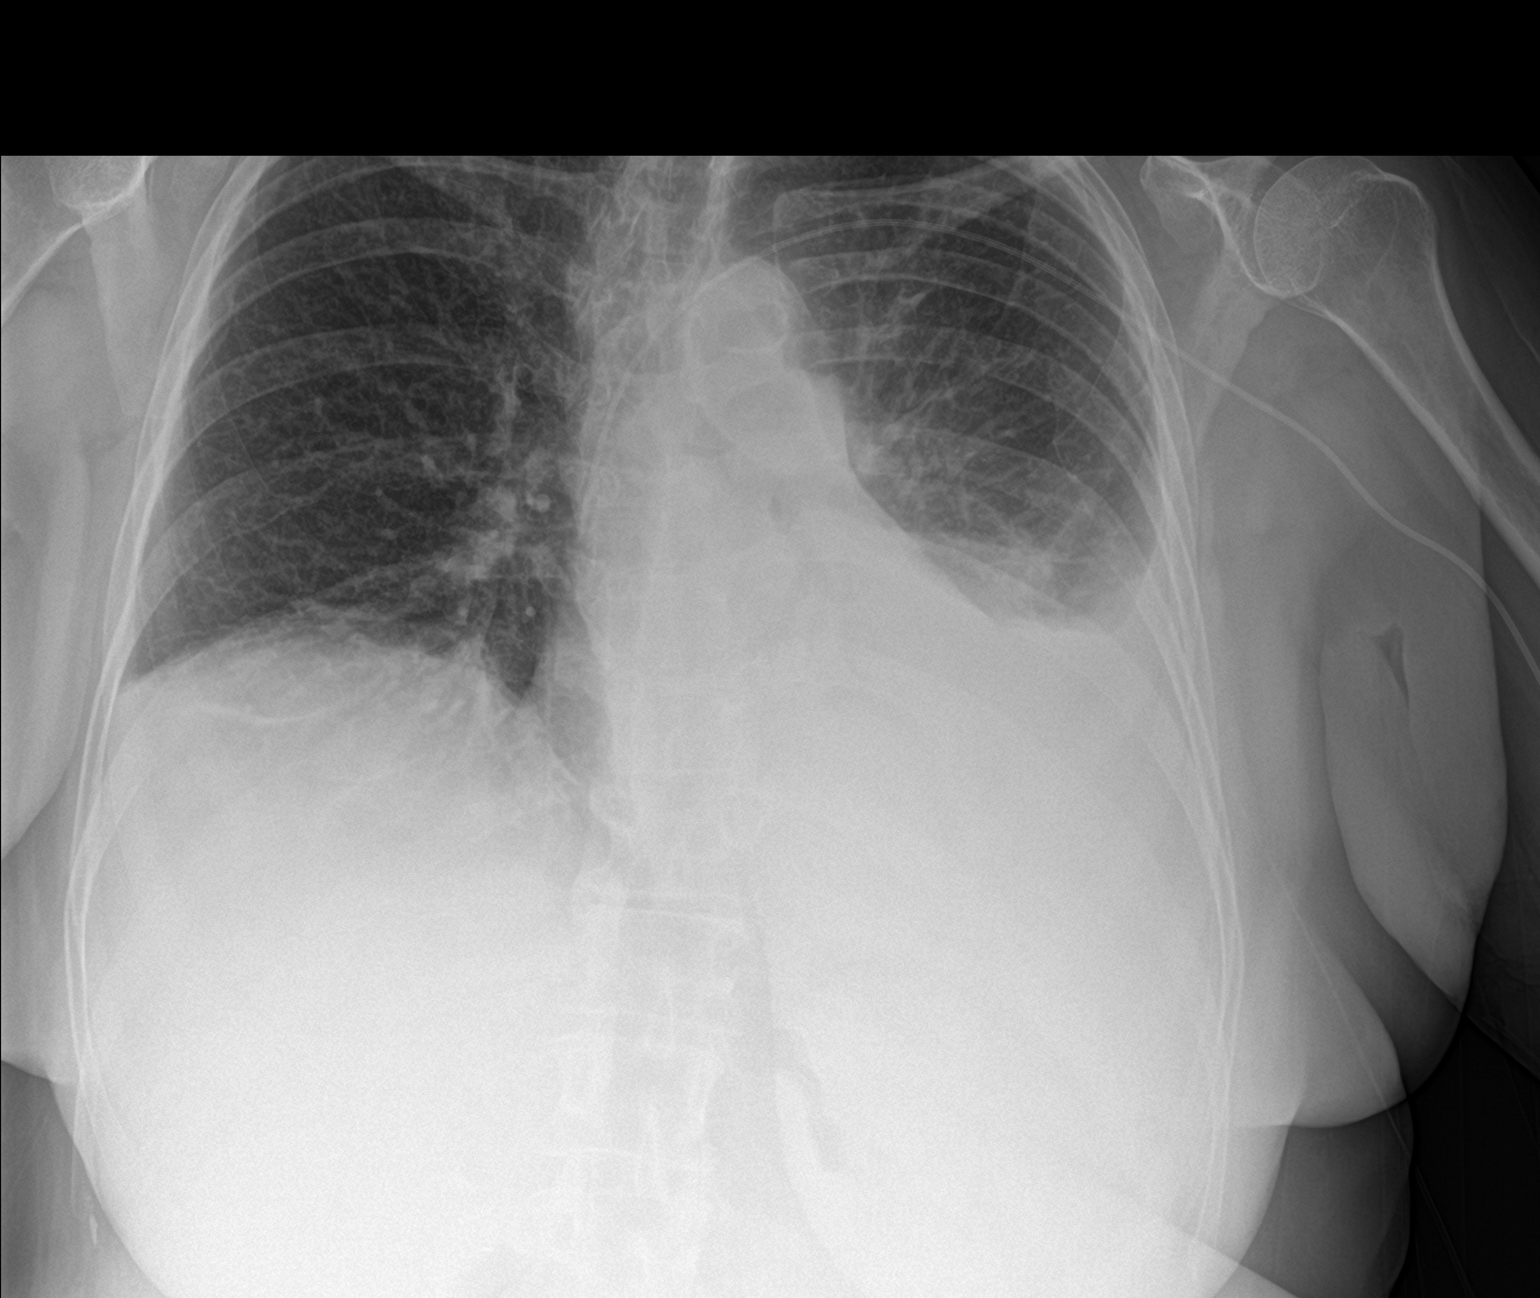

[chest lat]
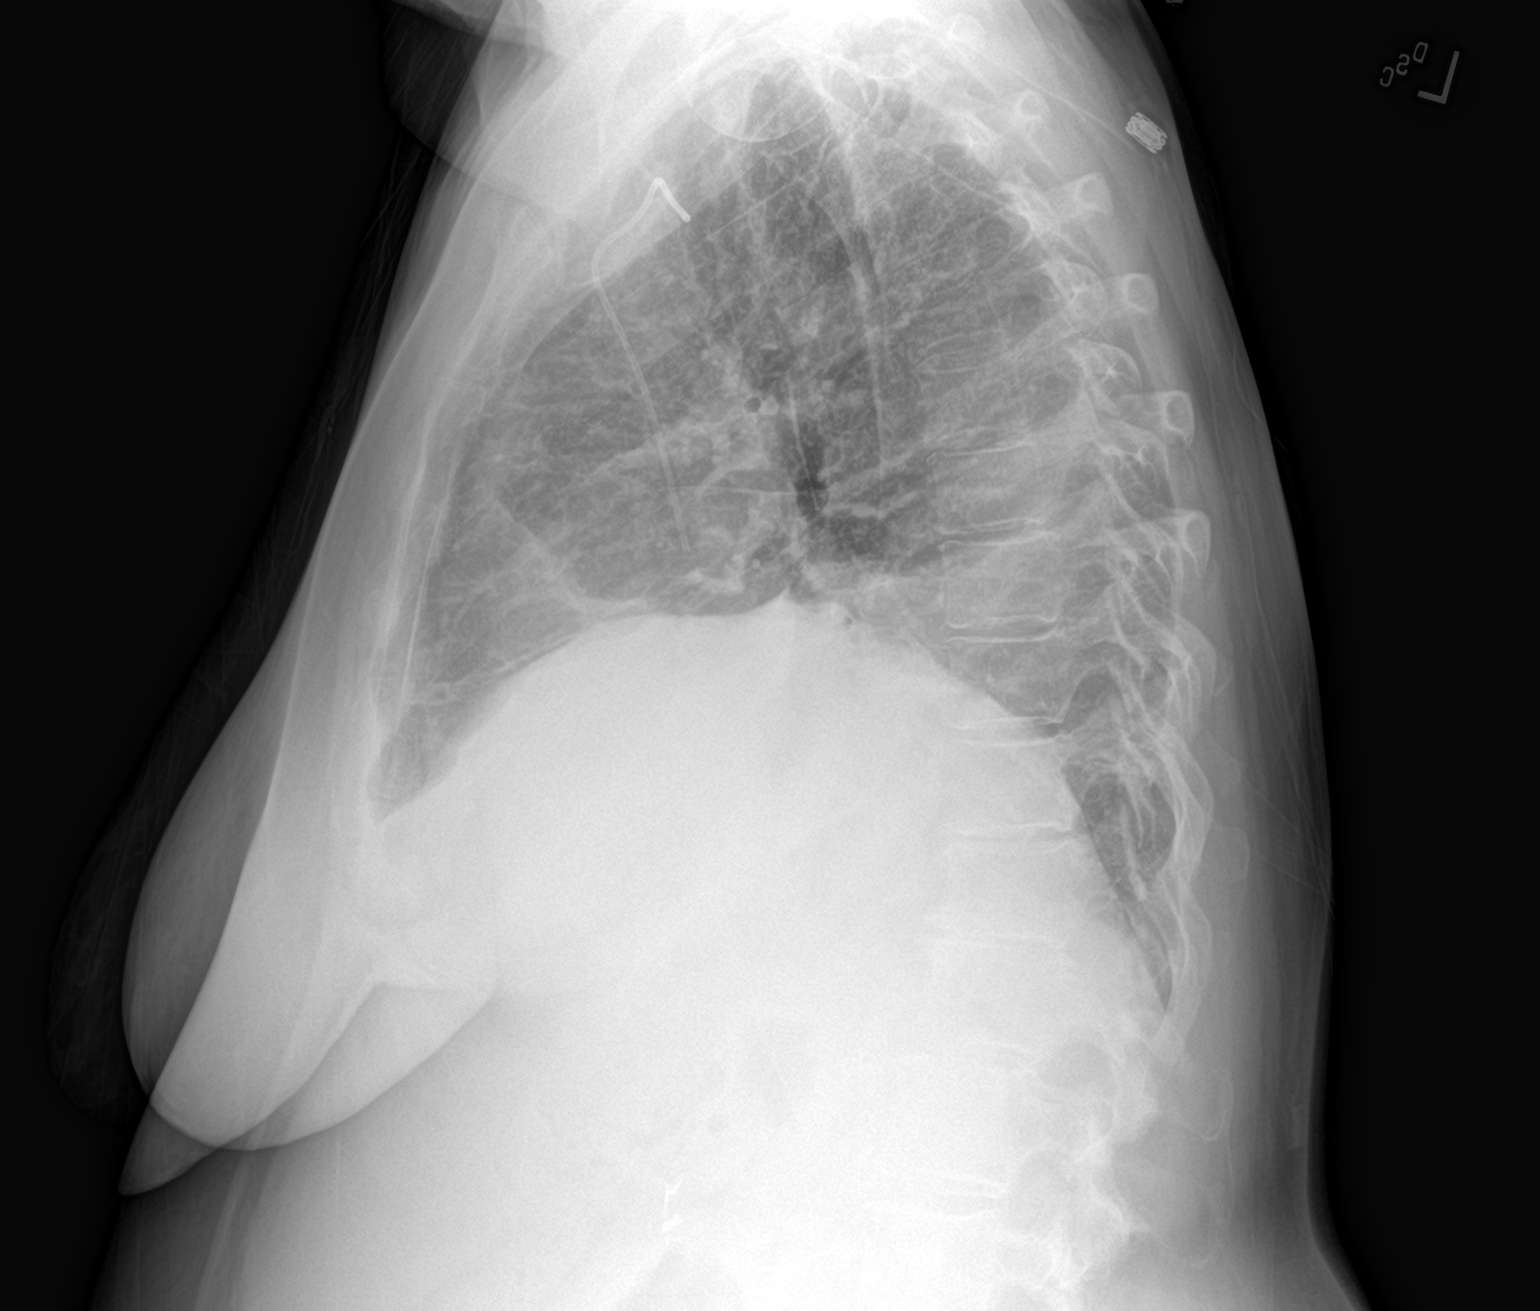

[2 of 2 positions shown; findings below may reference images not displayed]

FINDINGS: Unchanged left upper extremity PICC line. The heart size and
mediastinal contours are within normal limits. Normal pulmonary
vascularity. Unchanged small to moderate left pleural effusion with
left basilar atelectasis. No pneumothorax. No acute osseous
abnormality.
IMPRESSION: 1. Unchanged small to moderate left pleural effusion.

## 2021-11-19 MED ORDER — INSULIN GLARGINE-YFGN 100 UNIT/ML ~~LOC~~ SOLN
5.0000 [IU] | Freq: Every day | SUBCUTANEOUS | Status: DC
  Administered 2021-11-19 – 2021-11-21 (×3): 5 [IU] via SUBCUTANEOUS
  Filled 2021-11-19 (×3): qty 0.05

## 2021-11-19 MED ORDER — POTASSIUM CHLORIDE CRYS ER 20 MEQ PO TBCR
40.0000 meq | EXTENDED_RELEASE_TABLET | ORAL | Status: AC
  Administered 2021-11-19 (×2): 40 meq via ORAL
  Filled 2021-11-19 (×2): qty 2

## 2021-11-19 MED ORDER — MIDODRINE HCL 5 MG PO TABS
5.0000 mg | ORAL_TABLET | Freq: Three times a day (TID) | ORAL | Status: DC
  Administered 2021-11-19 – 2021-11-21 (×7): 5 mg via ORAL
  Filled 2021-11-19 (×8): qty 1

## 2021-11-19 MED ORDER — SILVER SULFADIAZINE 1 % EX CREA
TOPICAL_CREAM | Freq: Every day | CUTANEOUS | Status: DC
  Filled 2021-11-19 (×2): qty 50

## 2021-11-19 NOTE — Progress Notes (Signed)
CHG bath done this AM.

## 2021-11-19 NOTE — Progress Notes (Signed)
PROGRESS NOTE  Pamela [name] PRF:[id number] DOB: 1961/05/22   PCP: Marinda Elk, MD  Patient is from: Home  DOA: 11/03/2021 LOS: 29  Chief complaints:  Chief Complaint  Patient presents with   abnormal labs     Brief Narrative / Interim history: She29 year old F with recent diagnosis of small cell lung cancer with mets who started chemo on 2/20, DM-2, HTN, HLD and hypothyroidism admitted from oncology office with AKI and concern for TLS.  She completed first cycle of chemotherapy.  She developed pancytopenia.  Started on Granix. Monitoring labs including LFT and LDH due to risk for TLS.  There is also concern about possible pneumonia and BLE cellulitis with significant edema.  TTE with LVEF of 60 to 65%, G1 DD and normal RVSP.  Started on CTX, Zithromax and Lasix.  TSH elevated to 20.  Synthroid increased.  Antibiotics escalated to IV meropenem by oncology due to worsening neutropenia. Received platelet transfusion on 3/4 for thrombocytopenia to 8.   On 3/5-neutropenia and thrombocytopenia started to improve.  Worsening BLE edema and erythema with weeping and discoloration despite meropenem and diuretics.  Sent superficial culture, added vancomycin and consulted ID.  Per ID low suspicion for cellulitis, likely an adverse drug reaction, and recommended monitoring of antibiotics and considering surgery consult for punch biopsy of skin, trial of steroid and transfer to tertiary care for dermatology evaluation.   ID and oncology following.  WOCN on board as well.  Port-a-cath placement pending.   Subjective: Seen and examined earlier this morning.  No major events overnight of this morning.  Reports improvement in her breathing and leg pain.  Platelet dropped to 8000.  Oncology ordered platelet transfusion.  Denies melena, hematochezia or bruising.   Objective: Vitals:   11/18/21 0630 11/18/21 1256 11/18/21 2138 11/19/21 0503  BP: (!) 97/48 (!) 117/47 (!) 126/30 (!) 116/38  Pulse: 86  100 96 91  Resp:  18 18 18   Temp: (!) 97.5 F (36.4 C) 97.7 F (36.5 C) 98.1 F (36.7 C) 97.9 F (36.6 C)  TempSrc: Oral Oral Oral Oral  SpO2: 94% 91% 95% 92%  Weight:      Height:        Examination:  GENERAL: No apparent distress.  Nontoxic. HEENT: MMM.  Vision and hearing grossly intact.  NECK: Supple.  No apparent JVD.  RESP:  No IWOB.  Fair aeration bilaterally. CVS:  RRR. Heart sounds normal.  ABD/GI/GU: BS+. Abd soft, NTND.  MSK/EXT: Significant BLE edema with erythema and confluent blisters and discoloration SKIN: As above. NEURO: Awake and alert. Oriented appropriately.  No apparent focal neuro deficit. PSYCH: Calm. Normal affect.    Procedures:  None  Microbiology summarized: [id number] and influenza PCR nonreactive.  Assessment and Plan: * Small cell lung cancer ([place]) Diagnosed 2 weeks PTA S/P Liver biopsy 2/13.  Completed chemo inpatient being monitored for TLS and any adverse reaction.   -Continue pain control with fentanyl patch and oxycodone with bowel regimen. -IR consulted for port a cath now thrombocytopenia improved -Will have PICC line removed once port a cath placed -Oncology following.   Bilateral lower extremity edema with blisters/bulla Initially started on antibiotics for possible cellulitis.  However, did not improve with broad-spectrum antibiotics.  Had no fever.  ID consulted.  Cellulitis felt to be less likely and antibiotics discontinued.  Concern about adverse drug reaction, underlying venous insufficiency and poor venous return from liver metastasis although the later should have improved after chem.   TSH  elevated to 22 but free T4 within normal.  Albumin is okay.   -CTX 2/27>> meropenem 3/2-3/5 per oncology.  Vancomycin on 3/5 -ID recs-monitor off antibiotics, consider punch biopsy, steroid or transfer to tertiary center for dermatology -Appreciate help and guidance by WOCN -Continue IV Lasix.  Metolazone discontinued -Encourage leg  elevation. -Fluid restriction, strict I&O and daily weight.   -May benefit from lymphedema clinic  Elevated liver enzymes Recent Labs  Lab 11/13/21 0506 11/14/21 0600 11/15/21 0500 11/16/21 0545 11/17/21 0551  AST 386* 242* 136* 93* 68*  ALT 69* 59* 47* 44 41  ALKPHOS 310* 278* 248* 221* 208*  BILITOT 1.5* 1.5* 1.3* 1.3* 1.5*  PROT 5.6* 6.0* 6.4* 6.2* 5.8*  ALBUMIN 2.2* 2.4* 2.9* 2.7* 2.5*  Improving.  Continue monitoring    Pleural effusion on left Likely related to lung cancer.  Stable, may be improved on repeat chest x-ray on 3/6. -Diuretics as above  Pancytopenia ([place]) Likely due to chemo.  Received 2 units of blood on 2/28.  Platelet nadir at 8 on 3/4 and received platelet transfusion.  Leukopenia resolved.  Hgb stable.  Thrombocytopenia improved. -Granix discontinued on 3/5. -Resume subcu Lovenox for VTE prophylaxis now thrombocytopenia improved? -Continue monitoring   Metabolic alkalosis Likely from diuretics. -Discontinued metolazone  Physical deconditioning PT/OT-no need identified  Pneumonia Ruled out.  CXR finding likely atelectasis.  Procalcitonin might not be reliable in this patient after chemo.  Regardless, received broad-spectrum antibiotics and 3 days of Zithromax.  CXR with stable left-sided pleural effusion likely from malignancy.  Hypokalemia and hypomagnesemia K3.1.  Mg 2.0. -P.o. KCl 40x2 -Recheck in the morning  Hyponatremia Recent Labs  Lab 11/13/21 0506 11/14/21 0600 11/15/21 0500 11/16/21 0545 11/17/21 0551 11/18/21 0525 11/19/21 0850  NA 132* 134* 134* 133* 130* 128* 130*  Due to hypervolemia.  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  May be hypothyroidism as well -Diuretics and Synthroid as above  Hyperuricemia concern/risk for tumor lysis syndrome  Continue monitoring labs.  LDH trending down.  Uric acid down to 4.2.  Renal function stable.   AKI (acute kidney injury) ([place]) Recent Labs    11/10/21 0500 11/11/21 0559  11/12/21 0500 11/13/21 0506 11/14/21 0600 11/15/21 0500 11/16/21 0545 11/17/21 0551 11/18/21 0525 11/19/21 0500 11/19/21 0850  BUN 25* 19 16 17 21 21 16 18 18   --  19  CREATININE 0.81 0.63 0.62 0.66 0.59 0.58 0.58 0.61 0.73 0.67 0.80  -Discontinued metolazone on 3/5 -Closely monitor while on Lasix    Type 2 diabetes mellitus without complication, without long-term current use of insulin ([facility]) A1c 6.8% in 10/2021. Recent Labs  Lab 11/18/21 1117 11/18/21 1721 11/18/21 2201 11/19/21 0717 11/19/21 1120  GLUCAP 196* 189* 203* 168* 216*  -Continue SSI-moderate. -Start Semglee 5 units daily -Continue holding metformin  Essential hypertension Low DBP in the 40s and 30s.  SBP within normal.  Not symptomatic -On diuretics. -Add midodrine  Chronic anemia Recent Labs    11/10/21 0500 11/11/21 0559 11/12/21 0500 11/13/21 0506 11/14/21 0600 11/15/21 0500 11/16/21 0545 11/17/21 0551 11/18/21 0525 11/19/21 0500  HGB 10.6* 9.8* 9.2* 8.7* 11.7* 11.2* 11.2* 10.6* 10.3* 10.5*  -H&H stable.   Acquired hypothyroidism TSH elevated to 22 but free T4 within normal range at 0.85.  She reports good compliance with her Synthroid.  Although it is difficult to interpret elevated TSH in acute setting, it is reasonable to increase her Synthroid from 50 to 62.5 mcg -Repeat TSH in 4 to 6 weeks  Increased nutrient needs Body mass index is 41.01 kg/m. Nutrition Problem: Increased nutrient needs Etiology: cancer and cancer related treatments Signs/Symptoms: estimated needs Interventions: Ensure Enlive (each supplement provides 350kcal and 20 grams of protein), MVI   DVT prophylaxis:  Place TED hose Start: 11/05/21 0711 SCDs Start: 11/03/21 1431 Place TED hose Start: 11/03/21 1431  Code Status: Full code Family Communication: Family member at the bedside Level of care: Med-Surg Status is: Inpatient Remains inpatient appropriate because: significant BLE edema with ulceration       Final disposition: Likely home once medically stable.      Consultants:  Oncology Infectious disease   Sch Meds:  Scheduled Meds:  antiseptic oral rinse  15 mL Mouth Rinse Q6H   Chlorhexidine Gluconate Cloth  6 each Topical Daily   feeding supplement  237 mL Oral BID BM   furosemide  40 mg Intravenous BID   insulin aspart  0-15 Units Subcutaneous TID WC   insulin glargine-yfgn  5 Units Subcutaneous Daily   levothyroxine  62.5 mcg Oral QAC breakfast   midodrine  5 mg Oral TID WC   multivitamin with minerals  1 tablet Oral Daily   pantoprazole  40 mg Oral BID   polyethylene glycol  17 g Oral Daily   potassium chloride  40 mEq Oral Q4H   protein supplement  6 g Oral TID WC   senna-docusate  1 tablet Oral BID   silver sulfADIAZINE   Topical Daily   sodium chloride flush  10-40 mL Intracatheter Q12H   Continuous Infusions:   PRN Meds:.acetaminophen, albuterol, alum & mag hydroxide-simeth, heparin lock flush, lidocaine, LORazepam, ondansetron, oxyCODONE, prochlorperazine, senna-docusate, sodium chloride flush, temazepam  Antimicrobials: Anti-infectives (From admission, onward)    Start     Dose/Rate Route Frequency Ordered Stop   11/19/21 1300  vancomycin (VANCOREADY) IVPB 1250 mg/250 mL  Status:  Discontinued        1,250 mg 166.7 mL/hr over 90 Minutes Intravenous Every 24 hours 11/18/21 1120 11/18/21 1639   11/18/21 1015  vancomycin (VANCOREADY) IVPB 2000 mg/400 mL        2,000 mg 200 mL/hr over 120 Minutes Intravenous STAT 11/18/21 0922 11/18/21 1500   11/15/21 0800  meropenem (MERREM) 1 g in sodium chloride 0.9 % 100 mL IVPB  Status:  Discontinued        1 g 200 mL/hr over 30 Minutes Intravenous Every 8 hours 11/15/21 0702 11/18/21 1639   11/12/21 2100  cefTRIAXone (ROCEPHIN) 2 g in sodium chloride 0.9 % 100 mL IVPB  Status:  Discontinued        2 g 200 mL/hr over 30 Minutes Intravenous Every 24 hours 11/12/21 1958 11/15/21 0702   11/12/21 2100  azithromycin  (ZITHROMAX) tablet 500 mg        500 mg Oral Daily at bedtime 11/12/21 1958 11/14/21 2115        I have personally reviewed the following labs and images: CBC: Recent Labs  Lab 11/15/21 0500 11/16/21 0545 11/17/21 0551 11/18/21 0525 11/19/21 0500  WBC 0.6* 0.5* 0.9* 3.1* 10.3  NEUTROABS 0.2* 0.1* 0.1* 1.6* 6.0  HGB 11.2* 11.2* 10.6* 10.3* 10.5*  HCT 34.3* 35.1* 32.5* 31.2* 32.2*  MCV 93.7 93.6 92.3 91.8 91.7  PLT 22* 13* 8* 17* 43*   BMP &GFR Recent Labs  Lab 11/15/21 0500 11/16/21 0545 11/17/21 0551 11/18/21 0525 11/19/21 0500 11/19/21 0850  NA 134* 133* 130* 128*  --  130*  K 3.6 3.1* 3.3* 3.1*  --  3.4*  CL 95* 91* 88* 85*  --  86*  CO2 32 32 32 34*  --  35*  GLUCOSE 124* 144* 145* 158*  --  239*  BUN 21 16 18 18   --  19  CREATININE 0.58 0.58 0.61 0.73 0.67 0.80  CALCIUM 7.8* 7.6* 7.6* 7.8*  --  8.2*  MG 2.1 1.6* 1.9 1.5* 2.0  --   PHOS  --  2.8  --  2.9 2.8  --    Estimated Creatinine Clearance: 82.4 mL/min (by C-G formula based on SCr of 0.8 mg/dL). Liver & Pancreas: Recent Labs  Lab 11/15/21 0500 11/16/21 0545 11/17/21 0551 11/18/21 0525 11/19/21 0850  AST 136* 93* 68* 55* 61*  ALT 47* 44 41 35 37  ALKPHOS 248* 221* 208* 172* 215*  BILITOT 1.3* 1.3* 1.5* 1.4* 1.2  PROT 6.4* 6.2* 5.8* 5.9* 6.2*  ALBUMIN 2.9* 2.7* 2.5* 3.0* 3.0*   No results for input(s): LIPASE, AMYLASE in the last 168 hours. No results for input(s): AMMONIA in the last 168 hours. Diabetic: Recent Labs    11/16/21 1453  HGBA1C 5.8*   Recent Labs  Lab 11/18/21 1117 11/18/21 1721 11/18/21 2201 11/19/21 0717 11/19/21 1120  GLUCAP 196* 189* 203* 168* 216*   Cardiac Enzymes: No results for input(s): CKTOTAL, CKMB, CKMBINDEX, TROPONINI in the last 168 hours. No results for input(s): PROBNP in the last 8760 hours. Coagulation Profile: No results for input(s): INR, PROTIME in the last 168 hours. Thyroid Function Tests: Recent Labs    11/17/21 1022  TSH 13.863*    Lipid Profile: No results for input(s): CHOL, HDL, LDLCALC, TRIG, CHOLHDL, LDLDIRECT in the last 72 hours. Anemia Panel: No results for input(s): VITAMINB12, FOLATE, FERRITIN, TIBC, IRON, RETICCTPCT in the last 72 hours. Urine analysis:    Component Value Date/Time   COLORURINE AMBER (A) 11/03/2021 2255   APPEARANCEUR HAZY (A) 11/03/2021 2255   LABSPEC 1.014 11/03/2021 2255   PHURINE 5.0 11/03/2021 2255   GLUCOSEU NEGATIVE 11/03/2021 2255   HGBUR NEGATIVE 11/03/2021 2255   BILIRUBINUR NEGATIVE 11/03/2021 2255   KETONESUR 5 (A) 11/03/2021 2255   PROTEINUR NEGATIVE 11/03/2021 2255   NITRITE NEGATIVE 11/03/2021 2255   LEUKOCYTESUR NEGATIVE 11/03/2021 2255   Sepsis Labs: Invalid input(s): PROCALCITONIN, [place]  Microbiology: Recent Results (from the past 240 hour(s))  Aerobic Culture w Gram Stain (superficial specimen)     Status: None (Preliminary result)   Collection Time: 11/18/21  9:20 AM   Specimen: Wound  Result Value Ref Range Status   Specimen Description   Final    WOUND Performed at [facility] [address] [place], [place] [address]    Special Requests   Final    Immunocompromised Performed at [facility], [place] [address] [place], [place] [address]    Gram Stain   Final    NO WBC SEEN NO ORGANISMS SEEN Performed at [facility], [place] [address], [place],  [address]    Culture PENDING  Incomplete   Report Status PENDING  Incomplete    Radiology Studies: DG Chest 2 View  Result Date: 11/19/2021 CLINICAL DATA:  Pleural effusion.  History of lung cancer. EXAM: CHEST - 2 VIEW COMPARISON:  Chest x-ray dated November 16, 2021. FINDINGS: Unchanged left upper extremity PICC line. The heart size and mediastinal contours are within normal limits. Normal pulmonary vascularity. Unchanged small to moderate left pleural effusion with left basilar atelectasis. No pneumothorax. No acute osseous abnormality. IMPRESSION:  1. Unchanged small to moderate left pleural effusion. Electronically Signed   By: Titus Dubin M.D.   On: 11/19/2021 08:48      Saryah Loper T. [place]  If 7PM-7AM, please contact night-coverage [email or web address] 11/19/2021, 11:50 AM

## 2021-11-19 NOTE — Progress Notes (Signed)
Patient refused BLE dressing change, offered 3 different times. She said, I want to wait for wound care nurse. ?

## 2021-11-19 NOTE — Progress Notes (Signed)
?   ? ? ? ? ?Pamela [name] ? ?Date of Admission:  11/03/2021   Total days of inpatient antibiotics 7 ? ?Principal Problem: ?  Small cell lung cancer (Pamela [name]) ?Active Problems: ?  Acquired hypothyroidism ?  Chronic anemia ?  Essential hypertension ?  Type 2 diabetes mellitus without complication, without long-term current use of insulin (Pamela [name]) ?  AKI (acute kidney injury) (Pamela [name]) ?  Hyperuricemia concern/risk for tumor lysis syndrome  ?  Elevated liver enzymes ?  Hyponatremia ?  Pancytopenia (Pamela [name]) ?  Hypokalemia and hypomagnesemia ?  Bilateral lower extremity edema with blisters/bulla ?  Pneumonia ?  Physical deconditioning ?  Pleural effusion on left ?  Metabolic alkalosis ?     ?    ?Assessment: ?[name] with small cell lung cancer with mets to liver admitted for hyperuricemia and concern for tumor lysis syndrome.  ?#Bilateral LE wounds ?#Chemotherapy on 11/05/21 ?-Initially on admission pt had b/l edema(after stopping  anti-htn meds)  without reported erythema. She recieved IVF in the setting of AKI and TSS. Then developed worsening erythema/pain of b/l LE  ?-Unlikely to be representative of cellulitis. Pt developed "cellulitis" while on azithromycin and ceftriaxone for pneumonia. She was receiving chemotherapy at the same time as well, could be representative of adverse drug reaction.  Lastly, pt had no fever/leukocytosis during hospitalization. ? ?Recommendations: ?-Continue to hold antibiotics and monitor clinically. SP port cath today. On Neupogen.  ?-Consider general surgery consult for punch biopsy, send path ?-Consider steroid trial ?-Consider tertiary care [name] transfer for dermatology ? ? ?Microbiology:   ?Antibiotics: ?Azithromycin and ceftriaxone 2/27- 3/1 ?Meropenem 3/2-3/5 ?Vancomycin 3/5 ? ? ? ?SUBJECTIVE: ?Stable overnight. No new complaints.  ?Interval: Afebrile overnight. Wbc 10K. ?Review of Systems: ?Review of Systems  ?All other systems reviewed and are negative. ? ? ?Scheduled  Meds: ? antiseptic oral rinse  15 mL Mouth Rinse Q6H  ? Chlorhexidine Gluconate Cloth  6 each Topical Daily  ? feeding supplement  237 mL Oral BID BM  ? furosemide  40 mg Intravenous BID  ? insulin aspart  0-15 Units Subcutaneous TID WC  ? insulin glargine-yfgn  5 Units Subcutaneous Daily  ? levothyroxine  62.5 mcg Oral QAC breakfast  ? midodrine  5 mg Oral TID WC  ? multivitamin with minerals  1 tablet Oral Daily  ? pantoprazole  40 mg Oral BID  ? polyethylene glycol  17 g Oral Daily  ? protein supplement  6 g Oral TID WC  ? senna-docusate  1 tablet Oral BID  ? silver sulfADIAZINE   Topical Daily  ? sodium chloride flush  10-40 mL Intracatheter Q12H  ? ?Continuous Infusions: ?PRN Meds:.acetaminophen, albuterol, alum & mag hydroxide-simeth, heparin lock flush, lidocaine, LORazepam, ondansetron, oxyCODONE, prochlorperazine, senna-docusate, sodium chloride flush, temazepam ?Allergies  ?Allergen Reactions  ? Rofecoxib Swelling  ? ? ?OBJECTIVE: ?Vitals:  ? 11/18/21 1256 11/18/21 2138 11/19/21 0503 11/19/21 1312  ?BP: (!) 117/47 (!) 126/30 (!) 116/38 (!) 99/52  ?Pulse: 100 96 91 92  ?Resp: 18 18 18 16   ?Temp: 97.7 ?F (36.5 ?C) 98.1 ?F (36.7 ?C) 97.9 ?F (36.6 ?C) 97.6 ?F (36.4 ?C)  ?TempSrc: Oral Oral Oral Oral  ?SpO2: 91% 95% 92% (!) 83%  ?Weight:      ?Height:      ? ?Body mass index is 41.01 kg/m?. ? ?Physical Exam ?Constitutional:   ?   Appearance: Normal appearance.  ?HENT:  ?   Head: Normocephalic and atraumatic.  ?  Right Ear: Tympanic membrane normal.  ?   Left Ear: Tympanic membrane normal.  ?   Nose: Nose normal.  ?   Mouth/Throat:  ?   Mouth: Mucous membranes are moist.  ?Eyes:  ?   Extraocular Movements: Extraocular movements intact.  ?   Conjunctiva/sclera: Conjunctivae normal.  ?   Pupils: Pupils are equal, round, and reactive to light.  ?Cardiovascular:  ?   Rate and Rhythm: Normal rate and regular rhythm.  ?   Heart sounds: No murmur heard. ?  No friction rub. No gallop.  ?Pulmonary:  ?   Effort:  Pulmonary effort is normal.  ?   Breath sounds: Normal breath sounds.  ?Abdominal:  ?   General: Abdomen is flat.  ?   Palpations: Abdomen is soft.  ?Musculoskeletal:     ?   General: Normal range of motion.  ?   Comments: B/l LE edema, erythema blisters and discoloration  ?Skin: ?   General: Skin is warm and dry.  ?Neurological:  ?   General: No focal deficit present.  ?   Mental Status: She is alert and oriented to person, place, and time.  ?Psychiatric:     ?   Mood and Affect: Mood normal.  ? ? ? ? ?Lab Results ?Lab Results  ?Component Value Date  ? WBC 10.3 11/19/2021  ? HGB 10.5 (L) 11/19/2021  ? HCT 32.2 (L) 11/19/2021  ? MCV 91.7 11/19/2021  ? PLT 43 (L) 11/19/2021  ?  ?Lab Results  ?Component Value Date  ? CREATININE 0.80 11/19/2021  ? BUN 19 11/19/2021  ? NA 130 (L) 11/19/2021  ? K 3.4 (L) 11/19/2021  ? CL 86 (L) 11/19/2021  ? CO2 35 (H) 11/19/2021  ?  ?Lab Results  ?Component Value Date  ? ALT 37 11/19/2021  ? AST 61 (H) 11/19/2021  ? ALKPHOS 215 (H) 11/19/2021  ? BILITOT 1.2 11/19/2021  ?  ? ? ? ? ?Laurice Record, MD ?[facility]amela [name] for [name] ?Pamela [name] ?11/19/2021, 3:09 PM  ?

## 2021-11-19 NOTE — Progress Notes (Signed)
Her white cell count has come back already.  Yesterday, the white cell count was up to 3100.  Her platelet count was still low at 17,000.  Patient still has a horrible edema and erythema in her legs.  I do not know if this is all venous stasis.  We are trying to get fluid out of her.  She has been on metolazone with Lasix.  She is gotten albumin.  Her legs still are quite swollen. ? ?I would not imagine that this would be thromboembolic disease. ? ?I really cannot feel her liver right now.  As such, she is responding with decreased liver burden.  Yesterday, her bilirubin was 1.4.  Her LDH was 584.  Magnesium of 1.5.  Potassium 3.1.  Sodium 128 with a chloride of 65. ? ?She is eating good.  She is having no nausea or vomiting.  There is no mouth sores.  There is no diarrhea.  She has had no headache. ? ?I think that we probably could get her home and would not for her legs.  The legs really look rough.  I just, hate, that she is having the problems with the legs. ? ?She is off antibiotic now.  She is off Neupogen now. ? ?Her vital signs show temperature 97.9.  Pulse 91.  Blood pressure 116/38.  Her oral exam shows no mucositis.  Lungs are clear.  Cardiac exam is regular rate and rhythm.  Abdomen is soft.  She is somewhat obese.  There is no palpable hepatomegaly.  Bowel sounds are present.  Strongly shows marked edema and erythema of her lower legs.  She has bullous formation.  Her feet are wrapped.  Neurological exam is nonfocal. ? ?Her only problem right now is her legs.  I just wish she was want some way we could improve the status of the legs and decrease the swelling.  Her liver is improving.  Hopefully, she will have better blood flow through her liver and that may help blood flow out of her legs. ? ?We will see what the labs actually look like today. ? ?She is off antibiotics.  She is off of Neupogen.  There is no problems with tumor lysis. ? ?Again, we should be able to get her home this week, easily. ? ?I  would like to get a Port-A-Cath into her but I am not sure we will be able to given her thrombocytopenia right now. ? ?I appreciate the incredible help from everybody up on 6E. ? ? ? ?[name], MD ? ?Hebrews 12:12 ?

## 2021-11-19 NOTE — Consult Note (Addendum)
WOC Nurse Consult Note: ?Consult was previously performed on 2/28; refer to previous [place] notes.  Legs have significantly declined; related to cellulitis of unknown origin, according to progress notes; ID team is following. Requested to reassess and provide topical treatment orders.  ?Reason for Consult: Bilat legs with generalized edema and significant erythremia, this extends from bilat feet to below knees, to  anterior and posterior calves and bilat anterior feet and ankles.  There is significant blistering with large amt yellow drainage and loose peeling skin with yellow gelatinous outer layer, red moist viable wound bed visible with patchy areas of gelatinous skin is gently removed.Painful to touch. ?Dressing procedure/placement/frequency: Topical treatment orders provided for bedside nurses to perform as follows to promote healing and reduce edema: Apply Silvadene to bilat legs Q day, then cover with ABD pads and apply kerlex and an ace wrap in a spiral fashion, beginning just behind toes to below knees. Each time, remove previous Silvadene using a wash cloth and warm water and scrub lightly to assist with removal of loose nonviable skin. ?Pt will need home health assistance for dressing changes after discharge, and could also benefit from follow-up with the outpatient wound care center after discharge for further assessment; primary team please order if desired.  ?Please re-consult if further assistance is needed.  Thank-you,  ?Julien Girt MSN, RN, [place], [place], CNS ?[phone]  ?  ?

## 2021-11-19 NOTE — Procedures (Signed)
?HPI: ? ?The patient has had a H&P performed within the last 30 days, all history, medications, and exam have been reviewed. The patient has had a PICC placed and begun chemotherapy since the H&P.  She has been persistently neutropenic with treatment.  Additionally, she has developed cellulitis of bilateral lower extremities with significant edema requiring unaboot wraps. ? ?Medications: ?Prior to Admission medications   ?Medication Sig Start Date End Date Taking? Authorizing Provider  ?albuterol (VENTOLIN HFA) 108 (90 Base) MCG/ACT inhaler 2 puffs every 6 (six) hours as needed for wheezing or shortness of breath. 01/22/21  Yes [provider]  ?allopurinol (ZYLOPRIM) 300 MG tablet Take 1 tablet (300 mg total) by mouth daily. 11/02/21  Yes Sindy Guadeloupe, MD  ?esomeprazole (NEXIUM) 20 MG capsule Take 20 mg by mouth daily at 12 noon.   Yes [provider]  ?fentaNYL (DURAGESIC) 12 MCG/HR Place 1 patch onto the skin every 3 (three) days. 11/02/21  Yes Borders, Kirt Boys, NP  ?ferrous sulfate 325 (65 FE) MG EC tablet Take 325 mg by mouth daily.   Yes [provider]  ?levothyroxine (SYNTHROID, LEVOTHROID) 50 MCG tablet Take 50 mcg by mouth daily before breakfast.   Yes [provider]  ?magnesium oxide (MAG-OX) 400 MG tablet Take 400 mg by mouth daily.   Yes [provider]  ?Multiple Vitamin (MULTIVITAMIN) tablet Take 1 tablet by mouth daily.   Yes [provider]  ?naloxone (NARCAN) nasal spray 4 mg/0.1 mL 1 spray as needed (overdose). 11/02/21  Yes [provider]  ?Oxycodone HCl 10 MG TABS Take 1 tablet (10 mg total) by mouth every 3 (three) hours as needed (breakthrough pain). 11/02/21  Yes Borders, Kirt Boys, NP  ?simvastatin (ZOCOR) 40 MG tablet Take 40 mg by mouth every evening.   Yes [provider]  ?tiZANidine (ZANAFLEX) 2 MG tablet Take 2 mg by mouth every 8 (eight) hours as needed for muscle spasms.   Yes [provider]   ?lidocaine-prilocaine (EMLA) cream Apply to affected area once 11/02/21   Sindy Guadeloupe, MD  ?LORazepam (ATIVAN) 0.5 MG tablet Take 1 tablet (0.5 mg total) by mouth every 6 (six) hours as needed (Nausea or vomiting). 11/02/21   Sindy Guadeloupe, MD  ?ondansetron (ZOFRAN) 8 MG tablet Take 1 tablet (8 mg total) by mouth 2 (two) times daily as needed for refractory nausea / vomiting. Start on day 3 after carboplatin chemo. 11/02/21   Sindy Guadeloupe, MD  ?prochlorperazine (COMPAZINE) 10 MG tablet Take 1 tablet (10 mg total) by mouth every 6 (six) hours as needed (Nausea or vomiting). 11/02/21   Sindy Guadeloupe, MD  ?  ? ?Vital Signs: ?BP (!) 99/52 (BP Location: Right Arm)   Pulse 92   Temp 97.6 ?F (36.4 ?C) (Oral)   Resp 16   Ht 5\' 2"  (1.575 m)   Wt 224 lb 3.3 oz (101.7 kg)   SpO2 (!) 83%   BMI 41.01 kg/m?  ? ?Physical Exam ?Constitutional:   ?   General: She is not in acute distress. ?   Appearance: Normal appearance. She is not toxic-appearing.  ?HENT:  ?   Head: Normocephalic and atraumatic.  ?   Mouth/Throat:  ?   Mouth: Mucous membranes are moist.  ?   Pharynx: Oropharynx is clear.  ?Eyes:  ?   Extraocular Movements: Extraocular movements intact.  ?Cardiovascular:  ?   Rate and Rhythm: Normal rate and regular rhythm.  ?  Pulses: Normal pulses.  ?   Heart sounds: Normal heart sounds.  ?Pulmonary:  ?   Effort: Pulmonary effort is normal.  ?   Breath sounds: Normal breath sounds.  ?Abdominal:  ?   General: Abdomen is flat.  ?   Palpations: Abdomen is soft.  ?Skin: ?   General: Skin is warm and dry.  ?   Comments: B/L lower extremities erythema extending above level of wraps  ?Neurological:  ?   General: No focal deficit present.  ?   Mental Status: She is alert and oriented to person, place, and time.  ?Psychiatric:     ?   Mood and Affect: Mood normal.     ?   Behavior: Behavior normal.  ? ? ?Mallampati Score: ? ?  ? ?Labs: ? ?CBC: ?Recent Labs  ?  11/16/21 ?7121 11/17/21 ?9758 11/18/21 ?8325 11/19/21 ?0500  ?WBC  0.5* 0.9* 3.1* 10.3  ?HGB 11.2* 10.6* 10.3* 10.5*  ?HCT 35.1* 32.5* 31.2* 32.2*  ?PLT 13* 8* 17* 43*  ? ? ?COAGS: ?Recent Labs  ?  10/29/21 ?4982  ?INR 1.2  ? ? ?BMP: ?Recent Labs  ?  11/16/21 ?6415 11/17/21 ?8309 11/18/21 ?4076 11/19/21 ?0500 11/19/21 ?0850  ?NA 133* 130* 128*  --  130*  ?K 3.1* 3.3* 3.1*  --  3.4*  ?CL 91* 88* 85*  --  86*  ?CO2 32 32 34*  --  35*  ?GLUCOSE 144* 145* 158*  --  239*  ?BUN 16 18 18   --  19  ?CALCIUM 7.6* 7.6* 7.8*  --  8.2*  ?CREATININE 0.58 0.61 0.73 0.67 0.80  ?GFRNONAA >60 >60 >60 >60 >60  ? ? ?LIVER FUNCTION TESTS: ?Recent Labs  ?  11/16/21 ?8088 11/17/21 ?1103 11/18/21 ?1594 11/19/21 ?0850  ?BILITOT 1.3* 1.5* 1.4* 1.2  ?AST 93* 68* 55* 61*  ?ALT 44 41 35 37  ?ALKPHOS 221* 208* 172* 215*  ?PROT 6.2* 5.8* 5.9* 6.2*  ?ALBUMIN 2.7* 2.5* 3.0* 3.0*  ? ? ?Assessment/Plan:  ?Small Cell Lung Cancer ?--Has PICC, needs Port ?--plan to place tomorrow ?--NPO at midnight tonight ? ?Risks and benefits of image guided port-a-catheter placement was discussed with the patient including, but not limited to bleeding, infection, pneumothorax, or fibrin sheath development and need for additional procedures. ? ?All of the patient's questions were answered, patient is agreeable to proceed. ?Consent signed and in chart.  ? ?Signed: ?Jalexis Breed ?11/19/2021, 1:39 PM ? ? ?

## 2021-11-19 NOTE — Progress Notes (Signed)
Physical Therapy Treatment ?Patient Details ?Name: Pamela [name] ?MRN: [id number] ?DOB: [birth year] ?Today's Date: 11/19/2021 ? ? ?History of Present Illness Pt is 61 yo female admitted on 11/03/21 with worsening neutropenia, bil LE ?cellulitis, pleural effusion, pancytopenia.  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  Pt also with DM2, HTN, HLD, and hypothroidism. ? ?  ?PT Comments  ? ? Pt making good progress with goals met or nearly met.  She reports leg pain is much better and demonstrated improved gait with therapy.  Pt is mobilizing independently in room.  Pt was educated on exercises to continue but no longer requires skilled acute PT services.  Recommend continued mobility on her own or with family.  ?   ?Recommendations for follow up therapy are one component of a multi-disciplinary discharge planning process, led by the attending physician.  Recommendations may be updated based on patient status, additional functional criteria and insurance authorization. ? ?Follow Up Recommendations ? No PT follow up ?  ?  ?Assistance Recommended at Discharge PRN  ?Patient can return home with the following Assistance with cooking/housework;Help with stairs or ramp for entrance ?  ?Equipment Recommendations ? Rolling walker (2 wheels)  ?  ?Recommendations for Other Services   ? ? ?  ?Precautions / Restrictions Precautions ?Precautions: Fall ?Precaution Comments: Protective Precautions  ?  ? ?Mobility ? Bed Mobility ?Overal bed mobility: Independent ?  ?  ?  ?  ?  ?  ?  ?  ? ?Transfers ?Overall transfer level: Modified independent ?Equipment used: Rolling walker (2 wheels) ?  ?  ?  ?  ?  ?  ?  ?General transfer comment: Pt has been mobilizing in room and going to bathroom indpendnetly with use of RW ?  ? ?Ambulation/Gait ?Ambulation/Gait assistance: Supervision ?Gait Distance (Feet): 200 Feet ?Assistive device: Rolling walker (2 wheels) ?Gait Pattern/deviations: Step-through pattern, Decreased stride length ?  ?  ?   ?General Gait Details: Pt with improved speed and foot clearance; very steady with light use of RW; no cues required ? ? ?Stairs ?  ?  ?  ?  ?  ? ? ?Wheelchair Mobility ?  ? ?Modified Rankin (Stroke Patients Only) ?  ? ? ?  ?Balance Overall balance assessment: Needs assistance ?Sitting-balance support: No upper extremity supported ?Sitting balance-Leahy Scale: Normal ?  ?  ?Standing balance support: No upper extremity supported, Bilateral upper extremity supported ?Standing balance-Leahy Scale: Good ?Standing balance comment: RW for comfort but can take steps in room without AD ?  ?  ?  ?  ?  ?  ?  ?  ?  ?  ?  ?  ? ?  ?Cognition Arousal/Alertness: Awake/alert ?Behavior During Therapy: [facility] for tasks assessed/performed ?Overall Cognitive Status: Within Functional Limits for tasks assessed ?  ?  ?  ?  ?  ?  ?  ?  ?  ?  ?  ?  ?  ?  ?  ?  ?General Comments: Pt very pleasant and motivated ?  ?  ? ?  ?Exercises   ? ?  ?General Comments General comments (skin integrity, edema, etc.): Pt reports legs feeling much better with new wraps today. Educated on PT role and no further PT needs - pt agreed.  Recommended continued ambulation with family and elevating feet as much as possible.  Also, educated on HEP including LAQ, hip flex, ankle pumps, and sit to stands.  Also, educated that if mobility declines and would like  therapy to return could request ?  ?  ? ?Pertinent Vitals/Pain Pain Assessment ?Pain Assessment: No/denies pain  ? ? ?Home Living   ?  ?  ?  ?  ?  ?  ?  ?  ?  ?   ?  ?Prior Function    ?  ?  ?   ? ?PT Goals (current goals can now be found in the care plan section) Acute Rehab PT Goals ?PT Goal Formulation: All assessment and education complete, DC therapy ?Progress towards PT goals: Goals met/education completed, patient discharged from PT ? ?  ?Frequency ? ? ?   ? ? ? ?  ?PT Plan Current plan remains appropriate  ? ? ?Co-evaluation   ?  ?  ?  ?  ? ?  ?AM-PAC PT "6 Clicks" Mobility   ?Outcome Measure ? Help  needed turning from your back to your side while in a flat bed without using bedrails?: None ?Help needed moving from lying on your back to sitting on the side of a flat bed without using bedrails?: None ?Help needed moving to and from a bed to a chair (including a wheelchair)?: None ?Help needed standing up from a chair using your arms (e.g., wheelchair or bedside chair)?: None ?Help needed to walk in hospital room?: None ?Help needed climbing 3-5 steps with a railing? : A Little ?6 Click Score: 23 ? ?  ?End of Session   ?Activity Tolerance: Patient tolerated treatment well ?Patient left: in bed;with call bell/phone within reach ?Nurse Communication: Mobility status ?PT Visit Diagnosis: Other abnormalities of gait and mobility (R26.89);Muscle weakness (generalized) (M62.81) ?  ? ? ?Time: 1829-9371 ?PT Time Calculation (min) (ACUTE ONLY): 20 min ? ?Charges:  $Gait Training: 8-22 mins          ?          ? ?Abran Richard, PT ?Acute Rehab Services ?Pager [phone] ?[facility] [phone] ? ? ? ?Mikael Spray Kelilah Hebard ?11/19/2021, 3:56 PM ? ?

## 2021-11-20 ENCOUNTER — Encounter: Payer: Self-pay | Admitting: *Deleted

## 2021-11-20 ENCOUNTER — Inpatient Hospital Stay (HOSPITAL_COMMUNITY): Payer: BC Managed Care – PPO

## 2021-11-20 HISTORY — PX: IR IMAGING GUIDED PORT INSERTION: IMG5740

## 2021-11-20 LAB — COMPREHENSIVE METABOLIC PANEL
ALT: 34 U/L (ref 0–44)
AST: 58 U/L — ABNORMAL HIGH (ref 15–41)
Albumin: 2.8 g/dL — ABNORMAL LOW (ref 3.5–5.0)
Alkaline Phosphatase: 194 U/L — ABNORMAL HIGH (ref 38–126)
Anion gap: 10 (ref 5–15)
BUN: 24 mg/dL — ABNORMAL HIGH (ref 8–23)
CO2: 36 mmol/L — ABNORMAL HIGH (ref 22–32)
Calcium: 8.4 mg/dL — ABNORMAL LOW (ref 8.9–10.3)
Chloride: 85 mmol/L — ABNORMAL LOW (ref 98–111)
Creatinine, Ser: 0.74 mg/dL (ref 0.44–1.00)
GFR, Estimated: >60 mL/min (ref >60)
Glucose, Bld: 166 mg/dL — ABNORMAL HIGH (ref 70–99)
Potassium: 3.2 mmol/L — ABNORMAL LOW (ref 3.5–5.1)
Sodium: 131 mmol/L — ABNORMAL LOW (ref 135–145)
Total Bilirubin: 0.9 mg/dL (ref 0.3–1.2)
Total Protein: 6 g/dL — ABNORMAL LOW (ref 6.5–8.1)

## 2021-11-20 LAB — CBC
HCT: 31.3 % — ABNORMAL LOW (ref 36.0–46.0)
Hemoglobin: 10.3 g/dL — ABNORMAL LOW (ref 12.0–15.0)
MCH: 30.2 pg (ref 26.0–34.0)
MCHC: 32.9 g/dL (ref 30.0–36.0)
MCV: 91.8 fL (ref 80.0–100.0)
Platelets: 70 10*3/uL — ABNORMAL LOW (ref 150–400)
RBC: 3.41 MIL/uL — ABNORMAL LOW (ref 3.87–5.11)
RDW: 14.4 % (ref 11.5–15.5)
WBC: 11.8 10*3/uL — ABNORMAL HIGH (ref 4.0–10.5)
nRBC: 0.2 % (ref 0.0–0.2)

## 2021-11-20 LAB — AEROBIC CULTURE W GRAM STAIN (SUPERFICIAL SPECIMEN)
Culture: NO GROWTH
Gram Stain: NONE SEEN

## 2021-11-20 LAB — LACTATE DEHYDROGENASE: LDH: 603 U/L — ABNORMAL HIGH (ref 98–192)

## 2021-11-20 LAB — MAGNESIUM: Magnesium: 1.9 mg/dL (ref 1.7–2.4)

## 2021-11-20 LAB — GLUCOSE, CAPILLARY
Glucose-Capillary: 161 mg/dL — ABNORMAL HIGH (ref 70–99)
Glucose-Capillary: 131 mg/dL — ABNORMAL HIGH (ref 70–99)
Glucose-Capillary: 161 mg/dL — ABNORMAL HIGH (ref 70–99)

## 2021-11-20 LAB — PHOSPHORUS: Phosphorus: 3.9 mg/dL (ref 2.5–4.6)

## 2021-11-20 IMAGING — US IR IMAGING GUIDED PORT INSERTION
1 series · 1 of 1 positions shown · non-contrast
Comparison: none

INDICATION: Small cell lung cancer, chemotherapy

[Series 1: ir fluoro/shunt/fist · 1 of 1 slices shown]
[im 1/1]
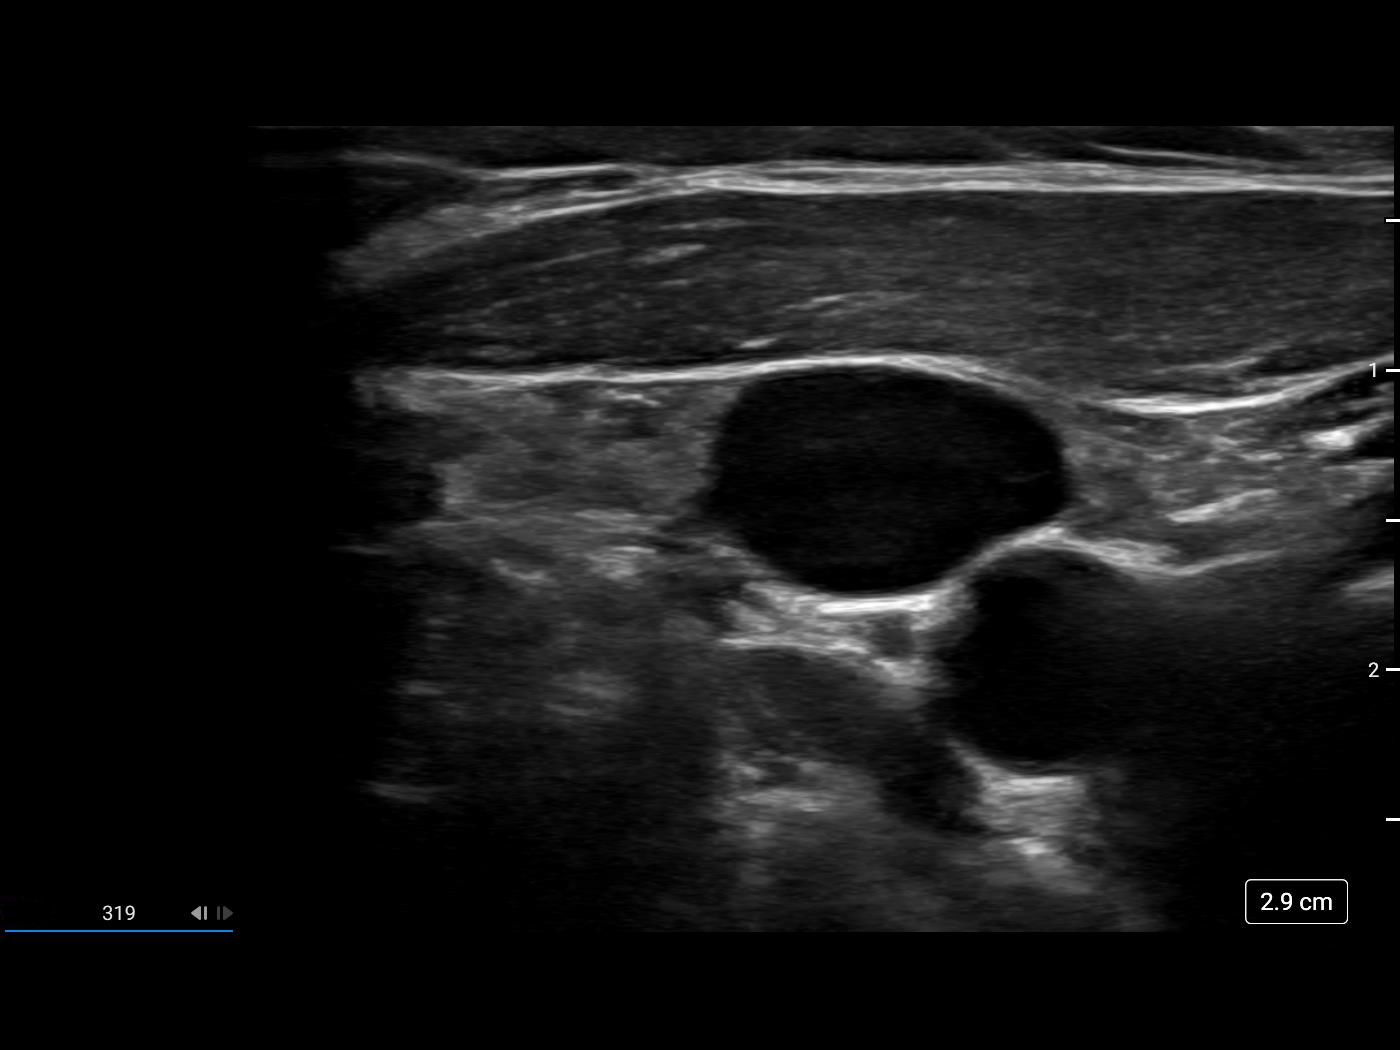

[1 of 1 positions shown; findings below may reference images not displayed]

EXAM:
Ultrasound-guided puncture of the right internal jugular vein

Placement of a right-sided chest port using fluoroscopic guidance

MEDICATIONS:
None

ANESTHESIA/SEDATION:
Moderate (conscious) sedation was employed during this procedure. A
total of Versed 2.5 mg and Fentanyl 100 mcg was administered
intravenously.

Moderate Sedation Time: 31 minutes. The patient's level of
consciousness and vital signs were monitored continuously by
radiology nursing throughout the procedure under my direct
supervision.

FLUOROSCOPY TIME:  Fluoroscopy Time: 1.2 minutes (12 mGy)

COMPLICATIONS:
None immediate.

PROCEDURE:
Informed written consent was obtained from the patient after a
thorough discussion of the procedural risks, benefits and
alternatives. All questions were addressed. Maximal Sterile Barrier
Technique was utilized including caps, mask, sterile gowns, sterile
gloves, sterile drape, hand hygiene and skin antiseptic. A timeout
was performed prior to the initiation of the procedure.

The patient was placed supine on the exam table. The right neck and
chest was prepped and draped in the standard sterile fashion. A
preliminary ultrasound of the right neck was performed and
demonstrates a patent right internal jugular vein. A permanent
ultrasound image was stored in the electronic medical record. The
overlying skin was anesthetized with 1% Lidocaine. Using ultrasound
guidance, access was obtained into the right internal jugular vein
using a 21 gauge micropuncture set. A wire was advanced into the
SVC, a short incision was made at the puncture site, and serial
dilatation performed. Next, in an ipsilateral infraclavicular
location, an incision was made at the site of the subcutaneous
reservoir. Blunt dissection was used to open a pocket to contain the
reservoir. A subcutaneous tunnel was then created from the port site
to the puncture site. A(n) 8 Fr single lumen catheter was advanced
through the tunnel. The catheter was attached to the port and this
was placed in the subcutaneous pocket. Under fluoroscopic guidance,
a peel away sheath was placed, and the catheter was trimmed to the
appropriate length and was advanced into the central veins. The
catheter length is 23 cm. The tip of the catheter lies near the
superior cavoatrial junction. The port flushes and aspirates
appropriately. The port was flushed and locked with heparinized
saline. The port pocket was closed in 2 layers using 3-0 and 4-0
Vicryl/absorbable suture. The venotomy incision was closed with 4-0
Vicryl. Dermabond was also applied to both incisions. The patient
tolerated the procedure well and was transferred to recovery in
stable condition.
IMPRESSION: Successful placement of a right chest port via the right internal
jugular vein. The port is ready for immediate use.

## 2021-11-20 MED ORDER — HEPARIN SOD (PORK) LOCK FLUSH 100 UNIT/ML IV SOLN
INTRAVENOUS | Status: AC | PRN
  Administered 2021-11-20: 500 [IU] via INTRAVENOUS

## 2021-11-20 MED ORDER — MIDODRINE HCL 5 MG PO TABS: 5.0000 mg | ORAL_TABLET | Freq: Three times a day (TID) | ORAL | 0 refills | Status: AC

## 2021-11-20 MED ORDER — LIDOCAINE-EPINEPHRINE 1 %-1:100000 IJ SOLN
INTRAMUSCULAR | Status: AC | PRN
  Administered 2021-11-20: 10 mL via INTRADERMAL

## 2021-11-20 MED ORDER — MIDAZOLAM HCL 2 MG/2ML IJ SOLN
INTRAMUSCULAR | Status: AC | PRN
Start: 2021-11-20 — End: 2021-11-20
  Administered 2021-11-20: .5 mg via INTRAVENOUS

## 2021-11-20 MED ORDER — MIDAZOLAM HCL 2 MG/2ML IJ SOLN
INTRAMUSCULAR | Status: AC
  Filled 2021-11-20: qty 2

## 2021-11-20 MED ORDER — MIDAZOLAM HCL 2 MG/2ML IJ SOLN
INTRAMUSCULAR | Status: AC | PRN
  Administered 2021-11-20: .5 mg via INTRAVENOUS

## 2021-11-20 MED ORDER — MIDAZOLAM HCL 2 MG/2ML IJ SOLN
INTRAMUSCULAR | Status: AC | PRN
Start: 2021-11-20 — End: 2021-11-20
  Administered 2021-11-20: 1 mg via INTRAVENOUS

## 2021-11-20 MED ORDER — LIDOCAINE-EPINEPHRINE 1 %-1:100000 IJ SOLN
INTRAMUSCULAR | Status: AC
  Filled 2021-11-20: qty 1

## 2021-11-20 MED ORDER — HEPARIN SOD (PORK) LOCK FLUSH 100 UNIT/ML IV SOLN
INTRAVENOUS | Status: AC
  Filled 2021-11-20: qty 5

## 2021-11-20 MED ORDER — LEVOTHYROXINE SODIUM 125 MCG PO TABS: 62.5000 ug | ORAL_TABLET | Freq: Every day | ORAL | 1 refills | Status: DC

## 2021-11-20 MED ORDER — FENTANYL CITRATE (PF) 100 MCG/2ML IJ SOLN
INTRAMUSCULAR | Status: AC | PRN
  Administered 2021-11-20: 25 ug via INTRAVENOUS

## 2021-11-20 MED ORDER — SENNOSIDES-DOCUSATE SODIUM 8.6-50 MG PO TABS: 1.0000 | ORAL_TABLET | Freq: Two times a day (BID) | ORAL | Status: DC

## 2021-11-20 MED ORDER — FENTANYL CITRATE (PF) 100 MCG/2ML IJ SOLN
INTRAMUSCULAR | Status: AC | PRN
  Administered 2021-11-20: 50 ug via INTRAVENOUS

## 2021-11-20 MED ORDER — FENTANYL CITRATE (PF) 100 MCG/2ML IJ SOLN
INTRAMUSCULAR | Status: AC
  Filled 2021-11-20: qty 2

## 2021-11-20 MED ORDER — POTASSIUM CHLORIDE CRYS ER 20 MEQ PO TBCR
40.0000 meq | EXTENDED_RELEASE_TABLET | ORAL | Status: AC
  Administered 2021-11-20 (×2): 40 meq via ORAL
  Filled 2021-11-20 (×2): qty 2

## 2021-11-20 NOTE — Progress Notes (Signed)
So far, Pamela [name] is doing really well.  She is going to have her Port-A-Cath in today.  Her lower legs are wrapped.  I do not have back her labs yet. ? ?Hopefully, we can get her home in a day or so.  I suspect that her legs probably will be swollen for a while.  Again, her liver test are all better.  Her LDH is still somewhat elevated. ? ?Her chest x-ray still showed the pleural effusion over on the left side.  There is no mention of atelectasis. ? ?She is eating well.  She is having no nausea or vomiting.  She really doing a good job try to get as much protein as possible. ? ?She has had no fever.  There has been no bleeding.  She has had no diarrhea.  She has had no nausea or vomiting.  She says that her hair is starting to come out a little bit.  I am not surprised by this.  It has been 2 weeks since she started her treatments. ? ?Her vital signs show temperature of 97.7.  Pulse 80.  Blood pressure 113/58.  Her oral exam shows no mucositis.  There is no scleral icterus.  She has no adenopathy in the neck.  Lungs show some decreased over on the left base.  Right lung fields clear.  There is no wheezes.  Cardiac exam regular rate and rhythm.  Abdomen is soft.  I really cannot palpate her liver edge.  Extremity shows the lower legs to be wrapped. ? ?I just hate the fact that she has this lymphedema in the lower legs.  She had the erythema.  Again this could have been venous stasis.  She is being seen by wound care.  She will definitely need outpatient follow-up. ? ?Hopefully, she will be able to go home tomorrow.  We will see what her labs look like.  We can definitely follow-up in the office.  She will get her second cycle of treatment, in addition to immunotherapy, in the office. ? ?I know that her case has been incredibly complicated because of the tumor lysis and renal and liver difficulties when she first came in.  Everything really has come around nicely.  I know that the staff upon [address] have been a very  integral part of her getting better. ? ?[name], MD ? ?Romans 1:16 ?

## 2021-11-20 NOTE — Progress Notes (Addendum)
?   ? ? ? ? ?[facility] ? ?Date of Admission:  11/03/2021   Total days of inpatient antibiotics 7 ? ?Principal Problem: ?  Small cell lung cancer ([place]) ?Active Problems: ?  Acquired hypothyroidism ?  Chronic anemia ?  Essential hypertension ?  Type 2 diabetes mellitus without complication, without long-term current use of insulin ([place]) ?  AKI (acute kidney injury) ([place]) ?  Hyperuricemia concern/risk for tumor lysis syndrome  ?  Elevated liver enzymes ?  Hyponatremia ?  Pancytopenia ([place]) ?  Hypokalemia and hypomagnesemia ?  Bilateral lower extremity edema with blisters/bulla ?  Pneumonia ?  Physical deconditioning ?  Pleural effusion on left ?  Metabolic alkalosis ?     ?    ?Assessment: ?10 YF with small cell lung cancer with mets to liver admitted for hyperuricemia and concern for tumor lysis syndrome.  ?#Bilateral LE wounds ?#Chemotherapy on 11/05/21 ?-Initially on admission pt had b/l edema(after stopping  anti-htn meds)  without reported erythema. She recieved IVF in the setting of AKI and TSS. Then developed worsening erythema/pain of b/l LE  ?-Unlikely to be representative of cellulitis. Pt developed "cellulitis" while on azithromycin and ceftriaxone for pneumonia. She was receiving chemotherapy at the same time as well, could be representative of adverse drug reaction.  Lastly, pt had no fever/leukocytosis during hospitalization. ?-Today, she reports her lower extremities feel much better since they have been wrapped. She remain afebrile off of antibiotics, wbc 11.8 following Granix(last dose was on 3/5).  ?Recommendations: ?-Continue to hold antibiotic.  ?-Consider general surgery consult for punch biopsy, send path ?-Consider steroid trial ?-Consider tertiary care center transfer for dermatology ?-ID will sign off. Pt plans on following with wound care.  ? ?Microbiology:   ?Antibiotics: ?Azithromycin and ceftriaxone 2/27- 3/1 ?Meropenem 3/2-3/5 ?Vancomycin  3/5 ? ? ? ?SUBJECTIVE: ?Resting in bed. Family at bedside. No new complaints.  ?Interval: Afebrile overnight. Wbc 11K ?Review of Systems: ?Review of Systems  ?All other systems reviewed and are negative. ? ? ?Scheduled Meds: ? antiseptic oral rinse  15 mL Mouth Rinse Q6H  ? Chlorhexidine Gluconate Cloth  6 each Topical Daily  ? feeding supplement  237 mL Oral BID BM  ? fentaNYL      ? furosemide  40 mg Intravenous BID  ? heparin lock flush      ? insulin aspart  0-15 Units Subcutaneous TID WC  ? insulin glargine-yfgn  5 Units Subcutaneous Daily  ? levothyroxine  62.5 mcg Oral QAC breakfast  ? midazolam      ? midazolam      ? midodrine  5 mg Oral TID WC  ? multivitamin with minerals  1 tablet Oral Daily  ? pantoprazole  40 mg Oral BID  ? polyethylene glycol  17 g Oral Daily  ? protein supplement  6 g Oral TID WC  ? senna-docusate  1 tablet Oral BID  ? silver sulfADIAZINE   Topical Daily  ? sodium chloride flush  10-40 mL Intracatheter Q12H  ? ?Continuous Infusions: ?PRN Meds:.acetaminophen, albuterol, alum & mag hydroxide-simeth, LORazepam, ondansetron, oxyCODONE, prochlorperazine, senna-docusate, sodium chloride flush, temazepam ?Allergies  ?Allergen Reactions  ? Rofecoxib Swelling  ? ? ?OBJECTIVE: ?Vitals:  ? 11/20/21 1018 11/20/21 1321 11/20/21 1325 11/20/21 1326  ?BP:  (!) 91/28 (!) 98/52 (!) 98/52  ?Pulse:  82    ?Resp:  18 18 18   ?Temp:  (!) 97.4 ?F (36.3 ?C) 98.2 ?F (36.8 ?C) 98.2 ?F (36.8 ?C)  ?  TempSrc:  Oral Oral Oral  ?SpO2: 92% 96%    ?Weight:      ?Height:      ? ?Body mass index is 40.04 kg/m?. ? ?Physical Exam ?Constitutional:   ?   Appearance: Normal appearance.  ?HENT:  ?   Head: Normocephalic and atraumatic.  ?   Right Ear: Tympanic membrane normal.  ?   Left Ear: Tympanic membrane normal.  ?   Nose: Nose normal.  ?   Mouth/Throat:  ?   Mouth: Mucous membranes are moist.  ?Eyes:  ?   Extraocular Movements: Extraocular movements intact.  ?   Conjunctiva/sclera: Conjunctivae normal.  ?   Pupils:  Pupils are equal, round, and reactive to light.  ?Cardiovascular:  ?   Rate and Rhythm: Normal rate and regular rhythm.  ?   Heart sounds: No murmur heard. ?  No friction rub. No gallop.  ?Pulmonary:  ?   Effort: Pulmonary effort is normal.  ?   Breath sounds: Normal breath sounds.  ?Abdominal:  ?   General: Abdomen is flat.  ?   Palpations: Abdomen is soft.  ?Musculoskeletal:     ?   General: Normal range of motion.  ?   Comments: B/l LE edema, erythema blisters and discoloration  ?Skin: ?   General: Skin is warm and dry.  ?Neurological:  ?   General: No focal deficit present.  ?   Mental Status: She is alert and oriented to person, place, and time.  ?Psychiatric:     ?   Mood and Affect: Mood normal.  ? ? ? ? ?Lab Results ?Lab Results  ?Component Value Date  ? WBC 11.8 (H) 11/20/2021  ? HGB 10.3 (L) 11/20/2021  ? HCT 31.3 (L) 11/20/2021  ? MCV 91.8 11/20/2021  ? PLT 70 (L) 11/20/2021  ?  ?Lab Results  ?Component Value Date  ? CREATININE 0.74 11/20/2021  ? BUN 24 (H) 11/20/2021  ? NA 131 (L) 11/20/2021  ? K 3.2 (L) 11/20/2021  ? CL 85 (L) 11/20/2021  ? CO2 36 (H) 11/20/2021  ?  ?Lab Results  ?Component Value Date  ? ALT 34 11/20/2021  ? AST 58 (H) 11/20/2021  ? ALKPHOS 194 (H) 11/20/2021  ? BILITOT 0.9 11/20/2021  ?  ? ? ? ? ?Laurice Record, MD ?[facility] ?[place] ?11/20/2021, 3:45 PM  ?

## 2021-11-20 NOTE — Progress Notes (Signed)
Patient has been managed inpatient by Dr Marin Olp and she would like to switch her care to the [facility] location and continue seeing Dr Marin Olp. She has already completed cycle one of treatment and needs cycle two to start 3/13. ? ?Reached out to Tamala Julian to introduce myself as the office RN Navigator and explain our new patient process.  Provided address and directions to the office including call back phone number. Reviewed with patient any concerns they may have or any possible barriers to attending their appointment.  ? ?Informed patient about my role as a navigator and that I will meet with them prior to their New Patient appointment and more fully discuss what services I can provide. At this time patient has no further questions or needs.   ? ?Oncology Nurse Navigator Documentation ? ?Oncology Nurse Navigator Flowsheets 11/20/2021  ?Abnormal Finding Date -  ?Confirmed Diagnosis Date -  ?Diagnosis Status -  ?Planned Course of Treatment -  ?Navigator Follow Up Date: 11/26/2021  ?Navigator Follow Up Reason: New Patient Appointment;Chemotherapy  ?Navigator Location [facility]  ?Navigator Encounter Type Introductory Phone Call  ?Telephone -  ?Patient Visit Type MedOnc  ?Treatment Phase Active Tx  ?Barriers/Navigation Needs Coordination of Care;Education  ?Education Other  ?Interventions Coordination of Care;Education  ?Acuity Level 3-Moderate Needs (3-4 Barriers Identified)  ?Coordination of Care Appts  ?Education Method Verbal;Teach-back  ?Support Groups/Services Friends and Family  ?Time Spent with Patient 45  ?  ?

## 2021-11-20 NOTE — Procedures (Signed)
Interventional Radiology Procedure Note ? ?Date of Procedure: 11/20/2021  ?Procedure: Port placement  ? ?Findings:  ?1. Successful right chest port placement via right IJ.   ? ?Complications: No immediate complications noted.  ? ?Estimated Blood Loss: minimal ? ?Follow-up and Recommendations: ?1. Ready for use.  ? ? ?Albin Felling, MD  ?Vascular & Interventional Radiology  ?11/20/2021 10:10 AM ? ? ? ?

## 2021-11-20 NOTE — TOC Initial Note (Signed)
Transition of Care (TOC) - Initial/Assessment Note  ? ? ?Patient Details  ?Name: Pamela [name] ?MRN: [id number] ?Date of Birth: Dec 12, 1960 ? ?Transition of Care (TOC) CM/SW Contact:    ?[place] Marina, Marjie Skiff, RN ?Phone Number: ?11/20/2021, 2:43 PM ? ?Clinical Narrative:                 ?Was asked by MD to set up outpatient wound care services for pt. Spoke with Freda Munro at the wound care center in [place]. Faxed referral to her with WOC note of recommendations. Will inform pt that referral was made. Will inform pt that she will need a caregiver to come learn the wound care for days she does not go to the wound care center. Pt will be unable to get a HHRN with her insurance. TOC will continue to follow. ? ?Expected Discharge Plan: Home/Self Care ?Barriers to Discharge: Continued Medical Work up ? ? ?  ?  ? ?Expected Discharge Plan and Services ?Expected Discharge Plan: Home/Self Care ?  ?Discharge Planning Services: CM Consult, Follow-up appt scheduled ?  ?   ? ?Prior Living Arrangements/Services ?  ?Lives with:: Spouse ?Patient language and need for interpreter reviewed:: Yes ?       ?Need for Family Participation in Patient Care: Yes (Comment) ?Care giver support system in place?: Yes (comment) ?  ?Criminal Activity/Legal Involvement Pertinent to Current Situation/Hospitalization: No - Comment as needed ? ?Activities of Daily Living ?Home Assistive Devices/Equipment: None ?ADL Screening (condition at time of admission) ?Patient's cognitive ability adequate to safely complete daily activities?: Yes ?Is the patient deaf or have difficulty hearing?: No ?Does the patient have difficulty seeing, even when wearing glasses/contacts?: No ?Does the patient have difficulty concentrating, remembering, or making decisions?: No ?Patient able to express need for assistance with ADLs?: Yes ?Does the patient have difficulty dressing or bathing?: Yes ?Independently performs ADLs?: No ?Communication: Needs assistance ?Is this a change from  baseline?: Pre-admission baseline ?Dressing (OT): Needs assistance ?Is this a change from baseline?: Pre-admission baseline ?Feeding: Independent ?Bathing: Needs assistance ?Is this a change from baseline?: Pre-admission baseline ?Toileting: Needs assistance ?Is this a change from baseline?: Change from baseline, expected to last <3 days ?In/Out Bed: Independent ?Walks in Home: Independent ?Does the patient have difficulty walking or climbing stairs?: No ?Weakness of Legs: None ?Weakness of Arms/Hands: None ? ?Emotional Assessment ?  ?  ?  ?Orientation: : Oriented to Self, Oriented to Place, Oriented to  Time, Oriented to Situation ?Alcohol / Substance Use: Not Applicable ?Psych Involvement: No (comment) ? ?Admission diagnosis:  Small cell lung cancer ([place]) [C34.90] ?Acquired hypothyroidism [E03.9] ?Type 2 diabetes mellitus without complication, without long-term current use of insulin ([place]) [E11.9] ?Small cell lung cancer in adult [facility]) [C34.90] ?Patient Active Problem List  ? Diagnosis Date Noted  ? Metabolic alkalosis 88/82/8003  ? Pleural effusion on left 11/16/2021  ? Physical deconditioning 11/15/2021  ? Pancytopenia ([place]) 11/14/2021  ? Hypokalemia and hypomagnesemia 11/14/2021  ? Bilateral lower extremity edema with blisters/bulla 11/14/2021  ? Pneumonia 11/14/2021  ? AKI (acute kidney injury) ([place]) 11/03/2021  ? Hyperuricemia concern/risk for tumor lysis syndrome  11/03/2021  ? Elevated liver enzymes 11/03/2021  ? Hyponatremia 11/03/2021  ? Small cell lung cancer ([place]) 11/02/2021  ? Chronic anemia 03/26/2018  ? Acquired hypothyroidism 12/17/2017  ? Postmenopausal 12/17/2017  ? Primary osteoarthritis involving multiple joints 09/21/2017  ? Trochanteric bursitis of left hip 09/21/2017  ? Essential hypertension 06/17/2016  ? Type 2 diabetes mellitus without complication, without  long-term current use of insulin ([place]) 06/17/2016  ? ?PCP:  Marinda Elk, MD ?Pharmacy:   ?[facility] [place], [place] AT [facility] OF [address] ?[place] ?[place] [address] ?Phone: [phone] Fax: [phone] ? ? ? ? ?Social Determinants of Health (SDOH) Interventions ?  ? ?Readmission Risk Interventions ?Readmission Risk Prevention Plan 11/06/2021  ?Transportation Screening Complete  ?PCP or Specialist Appt within 3-5 Days Complete  ?[place] or Home Care Consult Complete  ?Social Work Consult for [place] Planning/Counseling Complete  ?Palliative Care Screening Not Applicable  ?Medication Review Press photographer) Complete  ?Some recent data might be hidden  ? ? ? ?

## 2021-11-20 NOTE — Progress Notes (Signed)
PROGRESS NOTE  Pamela [name] ZYS:[id number] DOB: Oct 18, 1960   PCP: Marinda Elk, MD  Patient is from: Home  DOA: 11/03/2021 LOS: 53  Chief complaints:  Chief Complaint  Patient presents with   abnormal labs     Brief Narrative / Interim history: She22 year old F with recent diagnosis of small cell lung cancer with mets who started chemo on 2/20, DM-2, HTN, HLD and hypothyroidism admitted from oncology office with AKI and concern for TLS.  She completed first cycle of chemotherapy.  She developed pancytopenia.  Started on Granix. Monitoring labs including LFT and LDH due to risk for TLS.  There is also concern about possible pneumonia and BLE cellulitis with significant edema.  TTE with LVEF of 60 to 65%, G1 DD and normal RVSP.  Started on CTX, Zithromax and Lasix.  TSH elevated to 20.  Synthroid increased.  Antibiotics escalated to IV meropenem by oncology due to worsening neutropenia. Received platelet transfusion on 3/4 for thrombocytopenia to 8.   On 3/5-neutropenia and thrombocytopenia started to improve.  Worsening BLE edema and erythema with weeping and discoloration despite meropenem and diuretics.  Sent superficial culture, added vancomycin and consulted ID.  Per ID low suspicion for cellulitis, likely an adverse drug reaction, and recommended monitoring of antibiotics and considering surgery consult for punch biopsy of skin, trial of steroid and transfer to tertiary care for dermatology evaluation.   However, patient's leg pain improved after wrapping all the way to the knee.  Pancytopenia improved as well.  She had Port-A-Cath placed on 3/7.  Likely discharge home on 3/8 per oncology.  She needs outpatient wound clinic follow-up for daily dressing changing and wrapping.  Continue IV Lasix in house.   Subjective: Seen and examined earlier this morning.  No major events overnight of this morning.  Reports significant improvement in leg pain and redness after wrapping.  She says  she was happy that she is able to walk without pain.  Denies chest pain, dyspnea, GI or UTI symptoms.  Objective: Vitals:   11/20/21 1018 11/20/21 1321 11/20/21 1325 11/20/21 1326  BP:  (!) 91/28 (!) 98/52 (!) 98/52  Pulse:  82    Resp:  18 18 18   Temp:  (!) 97.4 F (36.3 C) 98.2 F (36.8 C) 98.2 F (36.8 C)  TempSrc:  Oral Oral Oral  SpO2: 92% 96%    Weight:      Height:        Examination:  GENERAL: No apparent distress.  Nontoxic. HEENT: MMM.  Vision and hearing grossly intact.  NECK: Supple.  No apparent JVD.  RESP:  No IWOB.  Fair aeration bilaterally. CVS:  RRR. Heart sounds normal.  ABD/GI/GU: BS+. Abd soft, NTND.  MSK/EXT: Significant BLE edema.  Ace wrap to below her knee.  Erythema improved. SKIN: As above. NEURO: Awake and alert. Oriented appropriately.  No apparent focal neuro deficit. PSYCH: Calm. Normal affect.    Procedures:  None  Microbiology summarized: [id number] and influenza PCR nonreactive.  Assessment and Plan: * Small cell lung cancer ([place]) Diagnosed 2 weeks PTA S/P Liver biopsy 2/13.  Completed chemo inpatient being monitored for TLS and any adverse reaction.   -Continue pain control with fentanyl patch and oxycodone with bowel regimen. -Port-A-Cath placed on 3/7.  Remove PICC line -Oncology following.   Bilateral lower extremity edema with blisters/bulla Cellulitis ruled out.  Concern about adverse drug reaction, underlying venous insufficiency and poor venous return from liver metastasis although the later should have improved  after chem.   TSH elevated to 22 but free T4 within normal.  Albumin is okay.  Leg pain and erythema seems to have improved after wrapping legs. -CTX 2/27>> meropenem 3/2-3/5 per oncology.  Vancomycin on 3/5 -ID-monitor off antibiotics, consider punch biopsy, steroid or transfer to tertiary center -Appreciate help and guidance by [facility] dressing/wrapping -Leg pain and erythema improved after leg  wrapping. -Continue IV Lasix while in-house.  May need p.o. diuretics on discharge -TOC to help with outpatient wound care referral in [facility] -Encourage leg elevation. -Fluid restriction, strict I&O and daily weight.   -Likely discharge home on 3/8 per oncology  Pleural effusion on left Likely related to lung cancer.  Stable, may be improved on repeat chest x-ray on 3/6. -Diuretics as above  Pancytopenia ([place]) Likely due to chemo.  Received 2 units of blood on 2/28.  Platelet nadir at 8 on 3/4 and received platelet transfusion.  Leukopenia resolved.  Hgb stable.  Thrombocytopenia improved. -Granix discontinued on 3/5. -Resume subcu Lovenox for VTE prophylaxis now thrombocytopenia improved -Continue monitoring   Elevated liver enzymes Recent Labs  Lab 11/16/21 0545 11/17/21 0551 11/18/21 0525 11/19/21 0850 11/20/21 0730  AST 93* 68* 55* 61* 58*  ALT 44 41 35 37 34  ALKPHOS 221* 208* 172* 215* 194*  BILITOT 1.3* 1.5* 1.4* 1.2 0.9  PROT 6.2* 5.8* 5.9* 6.2* 6.0*  ALBUMIN 2.7* 2.5* 3.0* 3.0* 2.8*  Improving.  Continue monitoring    Metabolic alkalosis Likely from diuretics. -Discontinued metolazone  Physical deconditioning PT/OT-no need identified  Pneumonia Ruled out.  CXR finding likely atelectasis.  Procalcitonin might not be reliable in this patient after chemo.  Regardless, received broad-spectrum antibiotics and 3 days of Zithromax.  CXR with stable left-sided pleural effusion likely from malignancy.  Hypokalemia and hypomagnesemia K3.2.  Mg 1.9. -P.o. KCl 40x2 -Recheck in the morning  Hyponatremia Recent Labs  Lab 11/14/21 0600 11/15/21 0500 11/16/21 0545 11/17/21 0551 11/18/21 0525 11/19/21 0850 11/20/21 0730  NA 134* 134* 133* 130* 128* 130* 131*  Due to hypervolemia.  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  May be hypothyroidism as well -Diuretics and Synthroid as above  Hyperuricemia concern/risk for tumor lysis syndrome  Continue monitoring  labs.  LDH trending down.  Uric acid down to 4.2.  Renal function stable.   AKI (acute kidney injury) ([place]) Recent Labs    11/10/21 0500 11/11/21 0559 11/12/21 0500 11/13/21 0506 11/14/21 0600 11/15/21 0500 11/16/21 0545 11/17/21 0551 11/18/21 0525 11/19/21 0500 11/19/21 0850  BUN 25* 19 16 17 21 21 16 18 18   --  19  CREATININE 0.81 0.63 0.62 0.66 0.59 0.58 0.58 0.61 0.73 0.67 0.80  -Discontinued metolazone on 3/5 -Closely monitor while on Lasix    Type 2 diabetes mellitus without complication, without long-term current use of insulin ([facility]) A1c 6.8% in 10/2021. Recent Labs  Lab 11/19/21 1120 11/19/21 1232 11/19/21 1647 11/19/21 2201 11/20/21 1213  GLUCAP 216* 165* 194* 198* 131*  -Continue SSI-moderate. -Continue Semglee 5 units daily -Continue holding metformin  Essential hypertension Low DBP in the 40s and 30s.  SBP within normal.  Not symptomatic -On diuretics. -Continue midodrine for BP support  Chronic anemia Recent Labs    11/11/21 0559 11/12/21 0500 11/13/21 0506 11/14/21 0600 11/15/21 0500 11/16/21 0545 11/17/21 0551 11/18/21 0525 11/19/21 0500 11/20/21 0730  HGB 9.8* 9.2* 8.7* 11.7* 11.2* 11.2* 10.6* 10.3* 10.5* 10.3*  -H&H stable.   Acquired hypothyroidism TSH elevated to 22 but free T4 within normal range  at 0.85.  She reports good compliance with her Synthroid.  Although it is difficult to interpret elevated TSH in acute setting, it is reasonable to increase her Synthroid from 50 to 62.5 mcg -Repeat TSH in 4 to 6 weeks   Increased nutrient needs Body mass index is 40.04 kg/m. Nutrition Problem: Increased nutrient needs Etiology: cancer and cancer related treatments Signs/Symptoms: estimated needs Interventions: Ensure Enlive (each supplement provides 350kcal and 20 grams of protein), MVI   DVT prophylaxis:  Place TED hose Start: 11/05/21 0711 SCDs Start: 11/03/21 1431 Place TED hose Start: 11/03/21 1431  Code Status: Full  code Family Communication: Family member at the bedside Level of care: Med-Surg Status is: Inpatient Remains inpatient appropriate because: significant BLE edema with ulceration      Final disposition: Likely home on 3/8      Consultants:  Oncology Infectious disease Interventional radiology   Sch Meds:  Scheduled Meds:  antiseptic oral rinse  15 mL Mouth Rinse Q6H   Chlorhexidine Gluconate Cloth  6 each Topical Daily   feeding supplement  237 mL Oral BID BM   fentaNYL       furosemide  40 mg Intravenous BID   heparin lock flush       insulin aspart  0-15 Units Subcutaneous TID WC   insulin glargine-yfgn  5 Units Subcutaneous Daily   levothyroxine  62.5 mcg Oral QAC breakfast   midazolam       midazolam       midodrine  5 mg Oral TID WC   multivitamin with minerals  1 tablet Oral Daily   pantoprazole  40 mg Oral BID   polyethylene glycol  17 g Oral Daily   protein supplement  6 g Oral TID WC   senna-docusate  1 tablet Oral BID   silver sulfADIAZINE   Topical Daily   sodium chloride flush  10-40 mL Intracatheter Q12H   Continuous Infusions:   PRN Meds:.acetaminophen, albuterol, alum & mag hydroxide-simeth, LORazepam, ondansetron, oxyCODONE, prochlorperazine, senna-docusate, sodium chloride flush, temazepam  Antimicrobials: Anti-infectives (From admission, onward)    Start     Dose/Rate Route Frequency Ordered Stop   11/19/21 1300  vancomycin (VANCOREADY) IVPB 1250 mg/250 mL  Status:  Discontinued        1,250 mg 166.7 mL/hr over 90 Minutes Intravenous Every 24 hours 11/18/21 1120 11/18/21 1639   11/18/21 1015  vancomycin (VANCOREADY) IVPB 2000 mg/400 mL        2,000 mg 200 mL/hr over 120 Minutes Intravenous STAT 11/18/21 0922 11/18/21 1500   11/15/21 0800  meropenem (MERREM) 1 g in sodium chloride 0.9 % 100 mL IVPB  Status:  Discontinued        1 g 200 mL/hr over 30 Minutes Intravenous Every 8 hours 11/15/21 0702 11/18/21 1639   11/12/21 2100  cefTRIAXone  (ROCEPHIN) 2 g in sodium chloride 0.9 % 100 mL IVPB  Status:  Discontinued        2 g 200 mL/hr over 30 Minutes Intravenous Every 24 hours 11/12/21 1958 11/15/21 0702   11/12/21 2100  azithromycin (ZITHROMAX) tablet 500 mg        500 mg Oral Daily at bedtime 11/12/21 1958 11/14/21 2115        I have personally reviewed the following labs and images: CBC: Recent Labs  Lab 11/15/21 0500 11/16/21 0545 11/17/21 0551 11/18/21 0525 11/19/21 0500 11/20/21 0730  WBC 0.6* 0.5* 0.9* 3.1* 10.3 11.8*  NEUTROABS 0.2* 0.1* 0.1* 1.6* 6.0  --  HGB 11.2* 11.2* 10.6* 10.3* 10.5* 10.3*  HCT 34.3* 35.1* 32.5* 31.2* 32.2* 31.3*  MCV 93.7 93.6 92.3 91.8 91.7 91.8  PLT 22* 13* 8* 17* 43* 70*   BMP &GFR Recent Labs  Lab 11/16/21 0545 11/17/21 0551 11/18/21 0525 11/19/21 0500 11/19/21 0850 11/20/21 0500 11/20/21 0730  NA 133* 130* 128*  --  130*  --  131*  K 3.1* 3.3* 3.1*  --  3.4*  --  3.2*  CL 91* 88* 85*  --  86*  --  85*  CO2 32 32 34*  --  35*  --  36*  GLUCOSE 144* 145* 158*  --  239*  --  166*  BUN 16 18 18   --  19  --  24*  CREATININE 0.58 0.61 0.73 0.67 0.80  --  0.74  CALCIUM 7.6* 7.6* 7.8*  --  8.2*  --  8.4*  MG 1.6* 1.9 1.5* 2.0  --  1.9  --   PHOS 2.8  --  2.9 2.8  --  3.9  --    Estimated Creatinine Clearance: 81.4 mL/min (by C-G formula based on SCr of 0.74 mg/dL). Liver & Pancreas: Recent Labs  Lab 11/16/21 0545 11/17/21 0551 11/18/21 0525 11/19/21 0850 11/20/21 0730  AST 93* 68* 55* 61* 58*  ALT 44 41 35 37 34  ALKPHOS 221* 208* 172* 215* 194*  BILITOT 1.3* 1.5* 1.4* 1.2 0.9  PROT 6.2* 5.8* 5.9* 6.2* 6.0*  ALBUMIN 2.7* 2.5* 3.0* 3.0* 2.8*   No results for input(s): LIPASE, AMYLASE in the last 168 hours. No results for input(s): AMMONIA in the last 168 hours. Diabetic: No results for input(s): HGBA1C in the last 72 hours.  Recent Labs  Lab 11/19/21 1120 11/19/21 1232 11/19/21 1647 11/19/21 2201 11/20/21 1213  GLUCAP 216* 165* 194* 198* 131*    Cardiac Enzymes: No results for input(s): CKTOTAL, CKMB, CKMBINDEX, TROPONINI in the last 168 hours. No results for input(s): PROBNP in the last 8760 hours. Coagulation Profile: No results for input(s): INR, PROTIME in the last 168 hours. Thyroid Function Tests: No results for input(s): TSH, T4TOTAL, FREET4, T3FREE, THYROIDAB in the last 72 hours.  Lipid Profile: No results for input(s): CHOL, HDL, LDLCALC, TRIG, CHOLHDL, LDLDIRECT in the last 72 hours. Anemia Panel: No results for input(s): VITAMINB12, FOLATE, FERRITIN, TIBC, IRON, RETICCTPCT in the last 72 hours. Urine analysis:    Component Value Date/Time   COLORURINE AMBER (A) 11/03/2021 2255   APPEARANCEUR HAZY (A) 11/03/2021 2255   LABSPEC 1.014 11/03/2021 2255   PHURINE 5.0 11/03/2021 2255   GLUCOSEU NEGATIVE 11/03/2021 2255   HGBUR NEGATIVE 11/03/2021 2255   BILIRUBINUR NEGATIVE 11/03/2021 2255   KETONESUR 5 (A) 11/03/2021 2255   PROTEINUR NEGATIVE 11/03/2021 2255   NITRITE NEGATIVE 11/03/2021 2255   LEUKOCYTESUR NEGATIVE 11/03/2021 2255   Sepsis Labs: Invalid input(s): PROCALCITONIN, [place]  Microbiology: Recent Results (from the past 240 hour(s))  Aerobic Culture w Gram Stain (superficial specimen)     Status: None   Collection Time: 11/18/21  9:20 AM   Specimen: Wound  Result Value Ref Range Status   Specimen Description   Final    WOUND Performed at [facility] [address] [place], [place] [address]    Special Requests   Final    Immunocompromised Performed at [facility], [place] [address] [place], [place] [address]    Gram Stain NO WBC SEEN NO ORGANISMS SEEN   Final   Culture  Final    NO GROWTH 2 DAYS Performed at [facility], [place] [address], [place], [place] [address]    Report Status 11/20/2021 FINAL  Final    Radiology Studies: IR IMAGING GUIDED PORT INSERTION  Result Date: 11/20/2021 INDICATION: Small cell lung cancer, chemotherapy  EXAM: Ultrasound-guided puncture of the right internal jugular vein Placement of a right-sided chest port using fluoroscopic guidance MEDICATIONS: None ANESTHESIA/SEDATION: Moderate (conscious) sedation was employed during this procedure. A total of Versed 2.5 mg and Fentanyl 100 mcg was administered intravenously. Moderate Sedation Time: 31 minutes. The patient's level of consciousness and vital signs were monitored continuously by radiology nursing throughout the procedure under my direct supervision. FLUOROSCOPY TIME:  Fluoroscopy Time: 1.2 minutes (12 mGy) COMPLICATIONS: None immediate. PROCEDURE: Informed written consent was obtained from the patient after a thorough discussion of the procedural risks, benefits and alternatives. All questions were addressed. Maximal Sterile Barrier Technique was utilized including caps, mask, sterile gowns, sterile gloves, sterile drape, hand hygiene and skin antiseptic. A timeout was performed prior to the initiation of the procedure. The patient was placed supine on the exam table. The right neck and chest was prepped and draped in the standard sterile fashion. A preliminary ultrasound of the right neck was performed and demonstrates a patent right internal jugular vein. A permanent ultrasound image was stored in the electronic medical record. The overlying skin was anesthetized with 1% Lidocaine. Using ultrasound guidance, access was obtained into the right internal jugular vein using a 21 gauge micropuncture set. A wire was advanced into the SVC, a short incision was made at the puncture site, and serial dilatation performed. Next, in an ipsilateral infraclavicular location, an incision was made at the site of the subcutaneous reservoir. Blunt dissection was used to open a pocket to contain the reservoir. A subcutaneous tunnel was then created from the port site to the puncture site. A(n) 8 Fr single lumen catheter was advanced through the tunnel. The catheter was attached  to the port and this was placed in the subcutaneous pocket. Under fluoroscopic guidance, a peel away sheath was placed, and the catheter was trimmed to the appropriate length and was advanced into the central veins. The catheter length is 23 cm. The tip of the catheter lies near the superior cavoatrial junction. The port flushes and aspirates appropriately. The port was flushed and locked with heparinized saline. The port pocket was closed in 2 layers using 3-0 and 4-0 Vicryl/absorbable suture. The venotomy incision was closed with 4-0 Vicryl. Dermabond was also applied to both incisions. The patient tolerated the procedure well and was transferred to recovery in stable condition. IMPRESSION: Successful placement of a right chest port via the right internal jugular vein. The port is ready for immediate use. Electronically Signed   By: Albin Felling M.D.   On: 11/20/2021 10:19      Chenita Ruda T. Elfers  If 7PM-7AM, please contact night-coverage [email or web address] 11/20/2021, 2:31 PM

## 2021-11-20 NOTE — Progress Notes (Signed)
Nutrition Follow-up ? ?INTERVENTION:  ? ?-Protein shakes being provided from home ?Will leave Ensure order  in case not available from family ?  ?-Beneprotein powder TID with meals, each packet provides 25 kcals and 6g protein ?  ?-Multivitamin with minerals daily ? ?NUTRITION DIAGNOSIS:  ? ?Increased nutrient needs related to cancer and cancer related treatments as evidenced by estimated needs. ? ?Ongoing. ? ?GOAL:  ? ?Patient will meet greater than or equal to 90% of their needs ? ?Progressing. ? ?MONITOR:  ? ?PO intake, Supplement acceptance, Labs, Weight trends, I & O's ? ?ASSESSMENT:  ? ?61 y.o. female with medical history significant of T2DM, HTN, HLD, hypothyroidism and extensive small cell lung cancer diagnosed 2 weeks ago who came to ED after her oncologist drew labs on her and asked that she come to ER with concerns for tumor lysis syndrome, AKI. ? ?S/p PAC placement this morning. ? ?Patient consumed 100% of meal yesterday with protein shakes from home. Will continue current orders.  ? ?Admission weight: 216 lbs ?Current weight: 218 lbs ?Per nursing documentation, pt with moderate BLE edema. ? ?Medications: Lasix, Multivitamin with minerals daily, Miralax, KLOR-CON, Senokot ? ?Labs reviewed: ?CBGs: [id number] ?Low Na, K ? ?Diet Order:   ?Diet Order   ? ?       ?  Diet regular Room service appropriate? Yes; Fluid consistency: Thin; Fluid restriction: 1500 mL Fluid  Diet effective now       ?  ? ?  ?  ? ?  ? ? ?EDUCATION NEEDS:  ? ?No education needs have been identified at this time ? ?Skin:  Skin Assessment: Skin Integrity Issues: ?Skin Integrity Issues:: Other (Comment) ?Other: ankle blister ? ?Last BM:  3/7- type 6 ? ?Height:  ? ?Ht Readings from Last 1 Encounters:  ?11/08/21 5\' 2"  (1.575 m)  ? ? ?Weight:  ? ?Wt Readings from Last 1 Encounters:  ?11/20/21 99.3 kg  ? ? ?BMI:  Body mass index is 40.04 kg/m?. ? ?Estimated Nutritional Needs:  ? ?Kcal:  1900-2100 ? ?Protein:  85-100g ? ?Fluid:   1.9L/day ? ?Clayton Bibles, MS, RD, LDN ?Inpatient Clinical Dietitian ?Contact information available via Amion ? ?

## 2021-11-21 ENCOUNTER — Inpatient Hospital Stay (HOSPITAL_COMMUNITY): Payer: BC Managed Care – PPO

## 2021-11-21 DIAGNOSIS — D649 Anemia, unspecified: Secondary | ICD-10-CM

## 2021-11-21 LAB — GLUCOSE, CAPILLARY
Glucose-Capillary: 147 mg/dL — ABNORMAL HIGH (ref 70–99)
Glucose-Capillary: 172 mg/dL — ABNORMAL HIGH (ref 70–99)

## 2021-11-21 LAB — CBC
HCT: 30.2 % — ABNORMAL LOW (ref 36.0–46.0)
Hemoglobin: 9.8 g/dL — ABNORMAL LOW (ref 12.0–15.0)
MCH: 30.3 pg (ref 26.0–34.0)
MCHC: 32.5 g/dL (ref 30.0–36.0)
MCV: 93.5 fL (ref 80.0–100.0)
Platelets: 103 10*3/uL — ABNORMAL LOW (ref 150–400)
RBC: 3.23 MIL/uL — ABNORMAL LOW (ref 3.87–5.11)
RDW: 14.4 % (ref 11.5–15.5)
WBC: 11.1 10*3/uL — ABNORMAL HIGH (ref 4.0–10.5)
nRBC: 0.6 % — ABNORMAL HIGH (ref 0.0–0.2)

## 2021-11-21 LAB — COMPREHENSIVE METABOLIC PANEL
ALT: 34 U/L (ref 0–44)
AST: 57 U/L — ABNORMAL HIGH (ref 15–41)
Albumin: 2.4 g/dL — ABNORMAL LOW (ref 3.5–5.0)
Alkaline Phosphatase: 191 U/L — ABNORMAL HIGH (ref 38–126)
Anion gap: 8 (ref 5–15)
BUN: 21 mg/dL (ref 8–23)
CO2: 35 mmol/L — ABNORMAL HIGH (ref 22–32)
Calcium: 7.8 mg/dL — ABNORMAL LOW (ref 8.9–10.3)
Chloride: 93 mmol/L — ABNORMAL LOW (ref 98–111)
Creatinine, Ser: 0.71 mg/dL (ref 0.44–1.00)
GFR, Estimated: >60 mL/min (ref >60)
Glucose, Bld: 112 mg/dL — ABNORMAL HIGH (ref 70–99)
Potassium: 3.4 mmol/L — ABNORMAL LOW (ref 3.5–5.1)
Sodium: 136 mmol/L (ref 135–145)
Total Bilirubin: 0.8 mg/dL (ref 0.3–1.2)
Total Protein: 5.5 g/dL — ABNORMAL LOW (ref 6.5–8.1)

## 2021-11-21 LAB — PHOSPHORUS: Phosphorus: 4.2 mg/dL (ref 2.5–4.6)

## 2021-11-21 LAB — MAGNESIUM: Magnesium: 1.7 mg/dL (ref 1.7–2.4)

## 2021-11-21 LAB — LACTATE DEHYDROGENASE: LDH: 509 U/L — ABNORMAL HIGH (ref 98–192)

## 2021-11-21 IMAGING — DX DG CHEST 2V
2 series · 2 of 2 positions shown · non-contrast
Comparison: Previous studies including the examination of
[DATE]

CLINICAL DATA: Lung carcinoma, placement of chest port

EXAM:
CHEST - 2 VIEW

[chest pa]
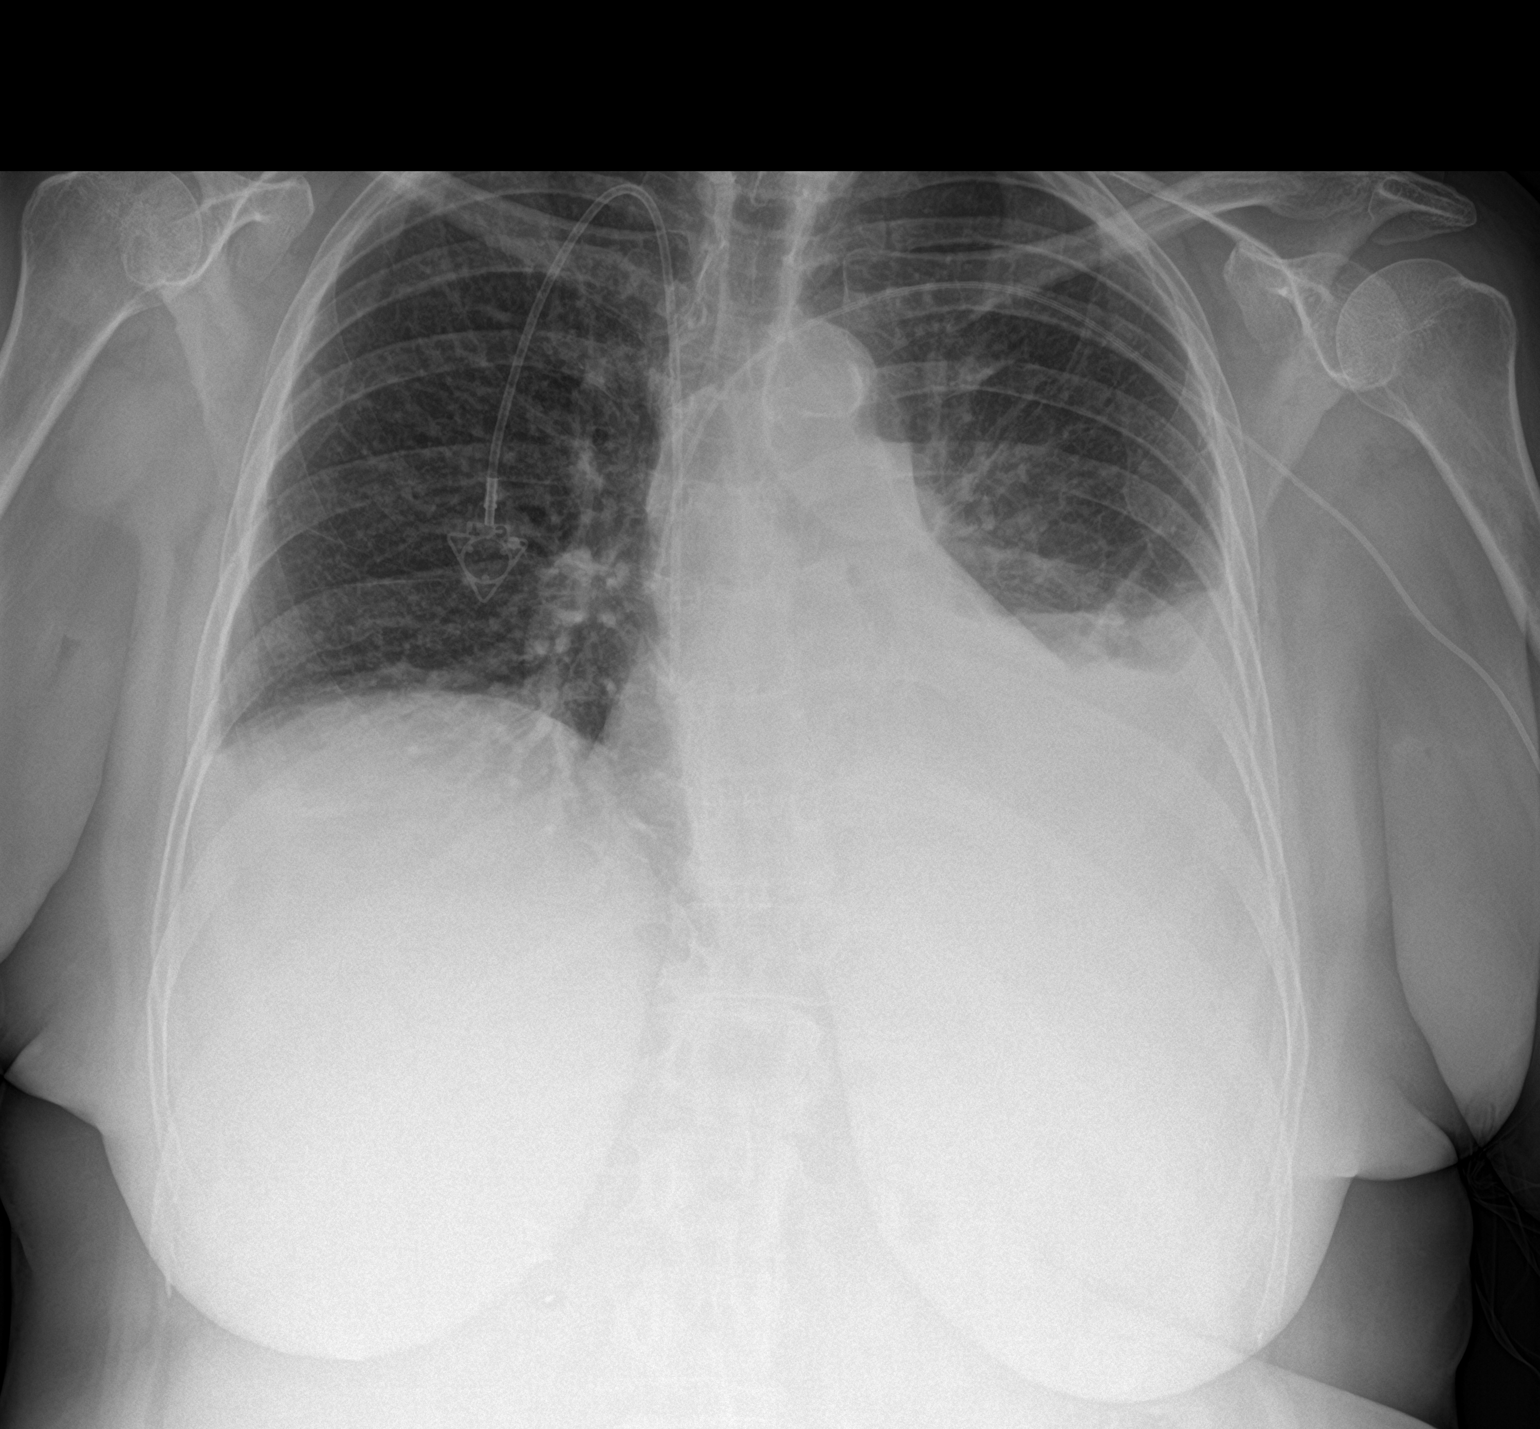

[chest lat]
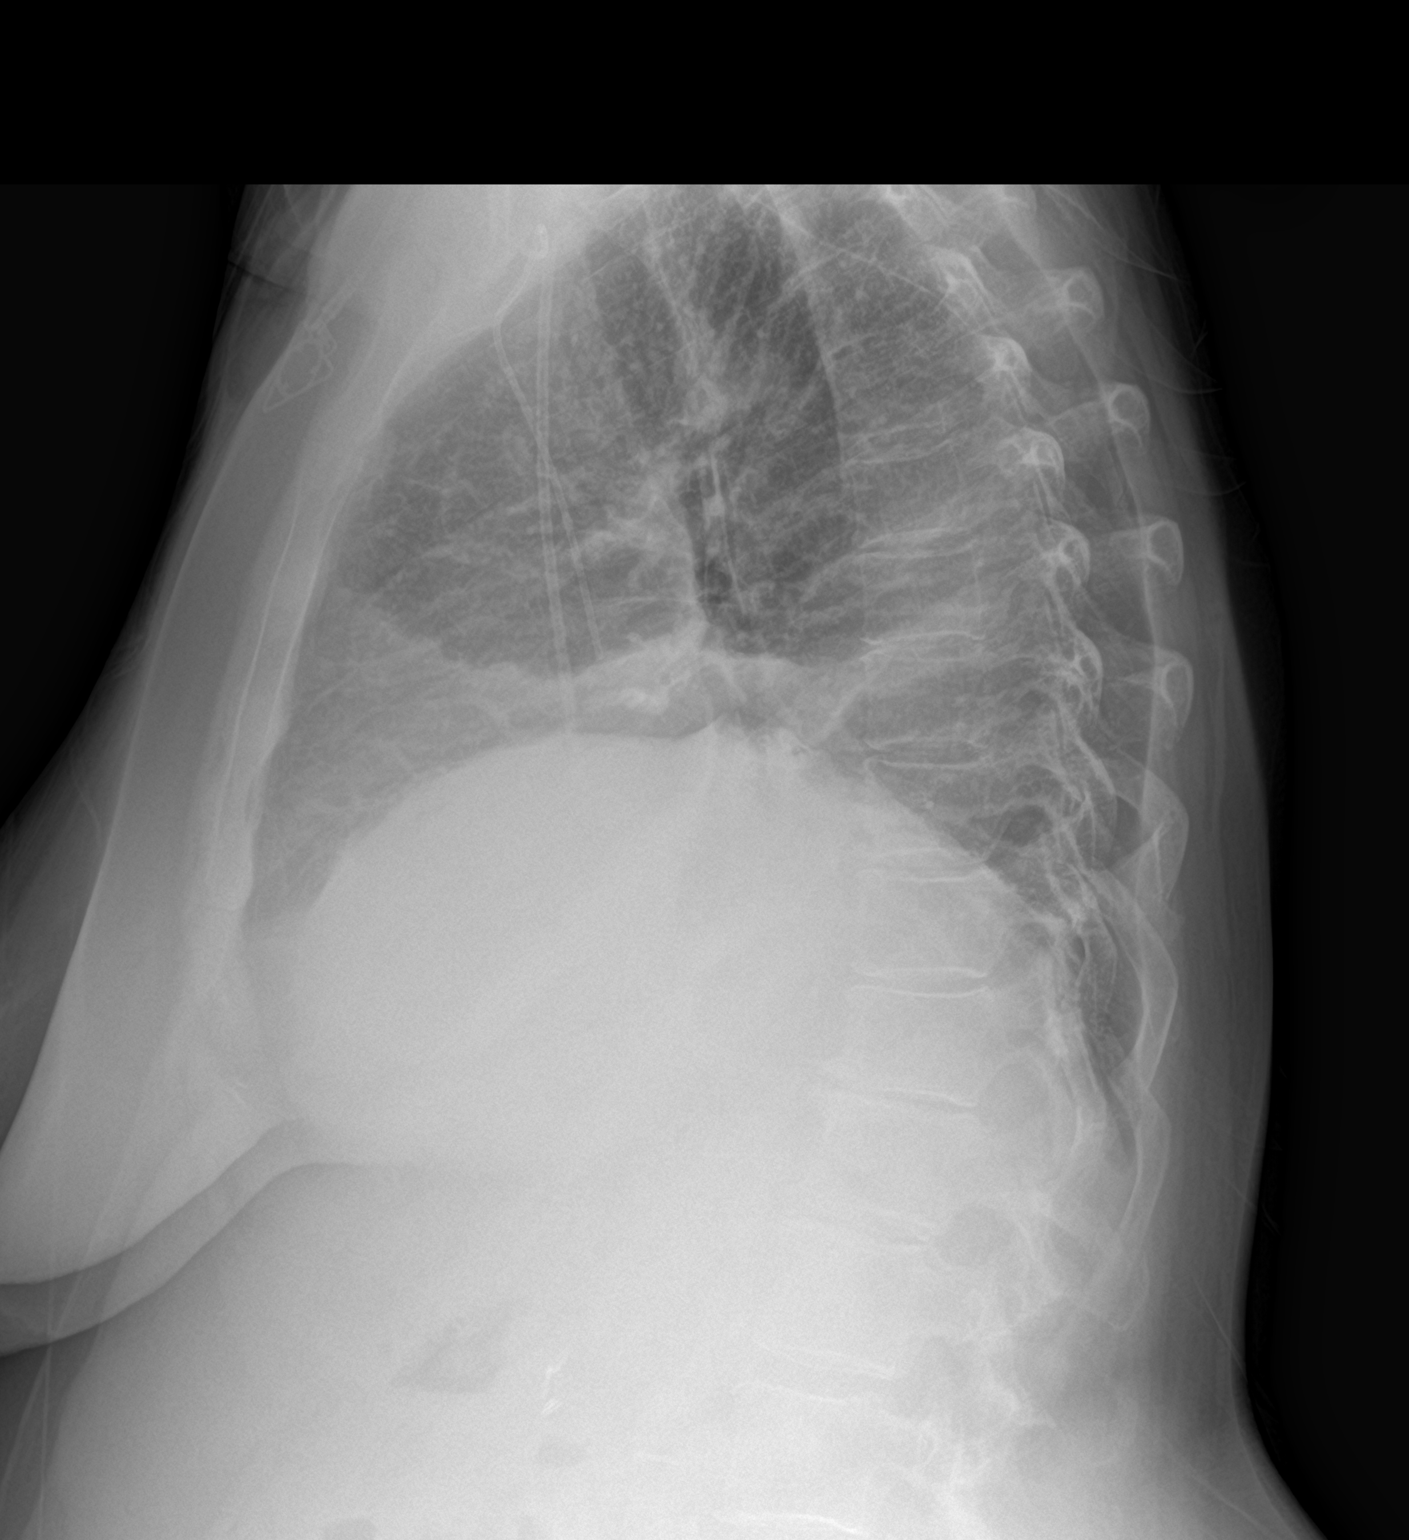

[2 of 2 positions shown; findings below may reference images not displayed]

FINDINGS: There is interval placement of right IJ chest port with its tip in
the superior vena cava close to the right atrium. Tip of PICC line
is seen in the superior vena cava. Small to moderate left pleural
effusion is present. Possibility of underlying atelectasis/pneumonia
is not excluded. There are small linear densities in the right lower
lung fields. There is minimal blunting of right lateral costophrenic
angle, possibly suggesting minimal right pleural effusion. There is
no pneumothorax.
IMPRESSION: There is interval placement of right IJ chest port. There is no
pneumothorax.

Bilateral pleural effusions, more so on the left side. No
significant interval changes are noted in the lung fields.

## 2021-11-21 MED ORDER — ALBUMIN HUMAN 25 % IV SOLN
50.0000 g | Freq: Once | INTRAVENOUS | Status: AC
  Administered 2021-11-21: 50 g via INTRAVENOUS
  Filled 2021-11-21: qty 200

## 2021-11-21 MED ORDER — FUROSEMIDE 40 MG PO TABS: 40.0000 mg | ORAL_TABLET | Freq: Every day | ORAL | 11 refills | Status: DC

## 2021-11-21 MED ORDER — FUROSEMIDE 10 MG/ML IJ SOLN
60.0000 mg | Freq: Once | INTRAMUSCULAR | Status: AC
  Administered 2021-11-21: 60 mg via INTRAVENOUS
  Filled 2021-11-21: qty 6

## 2021-11-21 NOTE — Progress Notes (Signed)
Instructed patient on procedure. HOB less than 45*,  held breath upon removal, pressure held for 45min with no s/sx of bleeding at this time. Pressure drsg applied and instructed to keep KDI for 24 hrs. And report any s/sx of bleeding applying pressure until it stops bleeding. Pt  (daughter-in-law at [facility]) VU. Fran Lowes, RN VAST ?

## 2021-11-21 NOTE — Progress Notes (Signed)
Everything is coming along incredibly well from his lab.  She has a Port-A-Cath in.  She looks great.  She says her legs feel better.  Her albumin is down a little bit.  I will give her 1 more dose of albumin followed by Lasix. ? ?I really think she can go home today.  She needs to have the PICC line taken out before she goes home. ? ?Her labs look great.  Her liver is back to normal for the most part.  Her bilirubin is 0.8.  SGPT 34 SGOT 57.  Her LDH is 509.  Her BUN is 21 creatinine 0.71.  Her white cell count is 11.1.  Hemoglobin 9.8.  Platelet count 103,000. ? ?She is eating well.  There is no nausea or vomiting.  She is having no diarrhea.  There is no mouth sores. ? ?Her vital signs show temperature of 97.7.  Pulse 84.  Blood pressure 110/47.  Her lungs are clear on the right side.  There is some slight decrease on the left side.  She has good air movement however.  No wheezes are noted.  Cardiac exam regular rate and rhythm.  Abdomen is soft.  Bowel sounds are present.  I really cannot palpate her liver.  Extremity shows her lower legs be wrapped.  Neurological exam is nonfocal. ? ?I am so happy for Pamela [name].  She has had incredibly complicated hospital stay.  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  She had her first cycle of chemotherapy and tolerated this incredibly well.  It worked [employer].  The only complication related been this swelling in her legs.  Again, I had believe that this will improve once her liver recovers. ? ?She will come to the office next week for her second cycle of chemotherapy.  I will then add immunotherapy to the chemotherapy. ? ?Again she can go home today from my point of view.  I would just want her to have the albumin before she goes.  Maybe, we can draw a little more water off her legs. ? ?I have to give the staff on [address] an incredible amount of credit for her recovery.  They really did an unbelievable  job with her.  They were incredibly compassionate and attentive and help with any issues that she had. ? ?[name], MD ? ?1 Cor 13:13 ?

## 2021-11-21 NOTE — Evaluation (Signed)
SATURATION QUALIFICATIONS: (This note is used to comply with regulatory documentation for home oxygen) ? ?Patient Saturations on Room Air at Rest = 86% ? ?Patient Saturations on Room Air while Ambulating = 84% ? ?Patient Saturations on 3 Liters of oxygen while Ambulating = 95% ? ?Please briefly explain why patient needs home oxygen: Patient at rest is low saturation and increases when put to 3L [place] ?

## 2021-11-21 NOTE — TOC Transition Note (Signed)
Transition of Care (TOC) - CM/SW Discharge Note ? ? ?Patient Details  ?Name: Pamela [name] ?MRN: [id number] ?Date of Birth: Oct 24, 1960 ? ?Transition of Care (TOC) CM/SW Contact:  ?Mele Sylvester, Marjie Skiff, RN ?Phone Number: ?11/21/2021, 3:13 PM ? ? ?Clinical Narrative:    ?Pt to dc on home 02. Desat screen done by nursing staff. 02 request called in to [facility]. MD to put in 02 order. ? ? ?Final next level of care: Home/Self Care ?Barriers to Discharge: Continued Medical Work up ? ?  ? ?Discharge Plan and Services ?  ?Discharge Planning Services: CM Consult, Follow-up appt scheduled ?           ?  ?Readmission Risk Interventions ?Readmission Risk Prevention Plan 11/06/2021  ?Transportation Screening Complete  ?PCP or Specialist Appt within 3-5 Days Complete  ?[facility] or Home Care Consult Complete  ?Social Work Consult for [place] Planning/Counseling Complete  ?Palliative Care Screening Not Applicable  ?Medication Review Press photographer) Complete  ?Some recent data might be hidden  ? ? ? ? ? ?

## 2021-11-21 NOTE — Discharge Summary (Signed)
Physician Discharge Summary   Patient: Pamela [name] MRN: [id number] DOB: [birth year]  Admit date:     11/03/2021  Discharge date: 11/21/21  Discharge Physician: Edwin Dada   PCP: Marinda Elk, MD   Recommendations at discharge:  Follow up with PCP Dr. Carrie Mew in 1 week Dr. Carrie Mew: Please check Cr/K on new Lasix in 1 week Dr. Carrie Mew: Please check TSH on new levothyroxine dose in 4-6 weeks Dr. Carrie Mew: Please attempt to wean off O2 if able, if not, reasonable to consider referral to Pulmonology, Dr. Lanney Gins Dr. Carrie Mew: If leg rash persists despite treatment of leg swelling with wraps and wound care, please refer to dermatology for evaluation Follow up with Oncology in 1-2 weeks       Discharge Diagnoses: Principal Problem:   Tumor lysis syndrome Active Problems:   AKI (acute kidney injury) ([place])   Elevated liver enzymes   Pancytopenia ([place])   Acute on chronic diastolic CHF   Bilateral lower extremity edema with blisters/bulla   Pleural effusion on left   Acquired hypothyroidism   Chronic anemia   Essential hypertension   Type 2 diabetes mellitus without complication, without long-term current use of insulin (HCC)   Hyponatremia   Hypokalemia and hypomagnesemia   Physical deconditioning   Metabolic alkalosis   Small cell lung cancer [facility])        Hospital Course: Pamela [name] is a 61 y.o. F with small cell lung cancer with mets who started chemo on 2/20, DM, HTN, and hypothyroidism admitted from oncology office with AKI and concern for tumor lysis syndrome.    In the hospital, she developed AKI and transaminitis and elevated LDH, confirming suspicion for TLS.  Oncology consulted, treated with carboplatin and etoposide.  Developed Pancytopenia, treated with Granix and improved.    She did have severe bilateral LE edema and weeping, treated with IV Lasix and wraps.  This was also treated with 7 days IV antibiotics.  Overall improved  with diuresis and leg wraps.  She had Port-A-Cath placed on 3/7.          Assessment and Plan: * Small cell lung cancer ([place])    Acute on chronic diastolic CHF Bilateral lower extremity edema with blisters/bulla Left pleural effusion Cellulitis ruled out. Noted to have pleural effusion, leg swelling, orthopnea and normal EF but grade I DD.  Diuresed with IV Lasix.  Legs improved.  CXR showed stable small effusions and no edema.    Discharged with oral Lasix, leg wraps.  Check BMP in 1 week  Discharged on new oxygen, persistently hypoxic despite diuresis.  Suspect this is some combination of chronic lung disease now with cancer superimposed     Pancytopenia ([place]) Likely due to chemo.  Received 2 units of blood on 2/28.  Platelet nadir at 8 on 3/4 and received platelet transfusion.  Leukopenia resolved.  Hgb stable.  Thrombocytopenia improved.  Granix discontinued on 3/5.  Elevated liver enzymes Due to chemo  Metabolic alkalosis  Pneumonia ruled out     Acquired hypothyroidism Possibly euthyroid sick.  Levothyroxine dose adjusted, follow up in 4-6 weeks.        Pain control - [employer] Controlled Substance Reporting System database was reviewed.     Consultants:  Oncology, Dr. Marin Olp infectious disease Disposition: Home health Diet recommendation:  Discharge Diet Orders (From admission, onward)     Start     Ordered   11/21/21 0000  Diet - low sodium heart healthy  11/21/21 1524            DISCHARGE MEDICATION: Allergies as of 11/21/2021       Reactions   Rofecoxib Swelling        Medication List     STOP taking these medications    Semaglutide(0.25 or 0.5MG/DOS) 2 MG/1.5ML Sopn       TAKE these medications    albuterol 108 (90 Base) MCG/ACT inhaler Commonly known as: VENTOLIN HFA 2 puffs every 6 (six) hours as needed for wheezing or shortness of breath.   allopurinol 300 MG tablet Commonly known as: ZYLOPRIM Take  1 tablet (300 mg total) by mouth daily.   esomeprazole 20 MG capsule Commonly known as: NEXIUM Take 20 mg by mouth daily at 12 noon.   fentaNYL 12 MCG/HR Commonly known as: [place] 1 patch onto the skin every 3 (three) days.   ferrous sulfate 325 (65 FE) MG EC tablet Take 325 mg by mouth daily.   furosemide 40 MG tablet Commonly known as: Lasix Take 1 tablet (40 mg total) by mouth daily.   levothyroxine 125 MCG tablet Commonly known as: SYNTHROID Take 0.5 tablets (62.5 mcg total) by mouth daily before breakfast. What changed:  medication strength how much to take   lidocaine-prilocaine cream Commonly known as: EMLA Apply to affected area once   LORazepam 0.5 MG tablet Commonly known as: Ativan Take 1 tablet (0.5 mg total) by mouth every 6 (six) hours as needed (Nausea or vomiting).   magnesium oxide 400 MG tablet Commonly known as: MAG-OX Take 400 mg by mouth daily.   midodrine 5 MG tablet Commonly known as: PROAMATINE Take 1 tablet (5 mg total) by mouth 3 (three) times daily with meals.   multivitamin tablet Take 1 tablet by mouth daily.   naloxone 4 MG/0.1ML Liqd nasal spray kit Commonly known as: NARCAN 1 spray as needed (overdose).   ondansetron 8 MG tablet Commonly known as: Zofran Take 1 tablet (8 mg total) by mouth 2 (two) times daily as needed for refractory nausea / vomiting. Start on day 3 after carboplatin chemo.   Oxycodone HCl 10 MG Tabs Take 1 tablet (10 mg total) by mouth every 3 (three) hours as needed (breakthrough pain).   prochlorperazine 10 MG tablet Commonly known as: COMPAZINE Take 1 tablet (10 mg total) by mouth every 6 (six) hours as needed (Nausea or vomiting).   senna-docusate 8.6-50 MG tablet Commonly known as: Senokot-S Take 1 tablet by mouth 2 (two) times daily.   simvastatin 40 MG tablet Commonly known as: ZOCOR Take 40 mg by mouth every evening.   tiZANidine 2 MG tablet Commonly known as: ZANAFLEX Take 2 mg by  mouth every 8 (eight) hours as needed for muscle spasms.               Durable Medical Equipment  (From admission, onward)           Start     Ordered   11/21/21 1524  DME Oxygen  Once       Question Answer Comment  Length of Need 6 Months   Mode or (Route) Nasal cannula   Liters per Minute 2   Frequency Continuous (stationary and portable oxygen unit needed)   Oxygen delivery system Gas      11/21/21 1524              Discharge Care Instructions  (From admission, onward)           Start  Ordered   11/21/21 0000  Discharge wound care:       Comments: Keep legs wrapped until you follow up at the wound clinic.   11/21/21 1524            Follow-up Information     [facility] Follow up.   Specialty: Wound Care Why: Follow up with wound care center. Contact information: [address] [id number] ar [phone]        [facility] Follow up.   Contact information: [place] [address] [phone]         Marinda Elk, MD. Schedule an appointment as soon as possible for a visit in 1 week(s).   Specialty: Physician Assistant Contact information: [place] [address] [phone]         Sindy Guadeloupe, MD. Schedule an appointment as soon as possible for a visit in 1 week(s).   Specialty: Oncology Contact information: [place] [address] [phone]                 Discharge Instructions     Diet - low sodium heart healthy   Complete by: As directed    Discharge instructions   Complete by: As directed    From Dr. Loleta Books: You were admitted for tumor lysis syndrome, kidney failure and liver injury. This was all from the chemo, which happens sometimes. You were treated and the kidneys and liver are better.  The tumor lysis syndrome improved.  Go see Dr. Janese Banks in 1  week Go see your primary care in 1 week  Buy a pulse oximeter (a device to measure your oxygen level) either at your pharmacy or on [employer]. Use this once daily to check your oxygen level. Wear the oxygen at all times. Your oxygen level should be 88% or above. If it is ever LESS than 88%, call your doctor or come to the hospital.  Start taking furosemide/Lasix 40 mg daily See your primary care doctor and have your labs checked in 1 week  Also start the midodrine and adjust your levothyroxine dose Have your primary care check your thyroid in 3 months   Discharge wound care:   Complete by: As directed    Keep legs wrapped until you follow up at the wound clinic.   Increase activity slowly   Complete by: As directed         Discharge Exam: Filed Weights   11/17/21 0529 11/20/21 0711 11/21/21 0519  Weight: 101.7 kg 99.3 kg 98.4 kg   General: Pt is alert, awake, not in acute distress Cardiovascular: RRR, nl S1-S2, no murmurs appreciated.   She has some lower extremity edema, this is wrapped, and not pitting mainly.   Respiratory: Normal respiratory rate and rhythm.  CTAB without rales or wheezes. Abdominal: Abdomen soft and non-tender.  No distension or HSM.   Neuro/Psych: Strength symmetric in upper and lower extremities.  Judgment and insight appear normal.   Condition at discharge: fair  The results of significant diagnostics from this hospitalization (including imaging, microbiology, ancillary and laboratory) are listed below for reference.   Imaging Studies: DG Chest 2 View  Result Date: 11/21/2021 CLINICAL DATA:  Lung carcinoma, placement of chest port EXAM: CHEST - 2 VIEW COMPARISON:  Previous studies including the examination of 11/19/2021 FINDINGS: There is interval placement of right IJ chest port with its tip in the superior vena  cava close to the right atrium. Tip of PICC line is seen in the superior vena cava. Small to moderate left pleural effusion is present.  Possibility of underlying atelectasis/pneumonia is not excluded. There are small linear densities in the right lower lung fields. There is minimal blunting of right lateral costophrenic angle, possibly suggesting minimal right pleural effusion. There is no pneumothorax. IMPRESSION: There is interval placement of right IJ chest port. There is no pneumothorax. Bilateral pleural effusions, more so on the left side. No significant interval changes are noted in the lung fields. Electronically Signed   By: Elmer Picker M.D.   On: 11/21/2021 14:48   DG Chest 2 View  Result Date: 11/19/2021 CLINICAL DATA:  Pleural effusion.  History of lung cancer. EXAM: CHEST - 2 VIEW COMPARISON:  Chest x-ray dated November 16, 2021. FINDINGS: Unchanged left upper extremity PICC line. The heart size and mediastinal contours are within normal limits. Normal pulmonary vascularity. Unchanged small to moderate left pleural effusion with left basilar atelectasis. No pneumothorax. No acute osseous abnormality. IMPRESSION: 1. Unchanged small to moderate left pleural effusion. Electronically Signed   By: Titus Dubin M.D.   On: 11/19/2021 08:48   DG Chest 2 View  Result Date: 11/16/2021 CLINICAL DATA:  Weakness and shortness of breath. History of lung cancer. EXAM: CHEST - 2 VIEW COMPARISON:  Chest x-ray dated November 14, 2021. FINDINGS: Unchanged left upper extremity PICC line. Stable cardiomediastinal silhouette with obscured left heart border. Unchanged small to moderate left pleural effusion with left lower lobe collapse/consolidation. Unchanged trace right pleural effusion and mild right basilar atelectasis. No pneumothorax. No acute osseous abnormality. IMPRESSION: 1. Unchanged bilateral pleural effusions and left lower lobe collapse/consolidation. Electronically Signed   By: Titus Dubin M.D.   On: 11/16/2021 11:31   DG Chest 2 View  Result Date: 11/14/2021 CLINICAL DATA:  Shortness of breath.  History of lung cancer. EXAM:  CHEST - 2 VIEW COMPARISON:  Chest x-ray dated November 12, 2021. FINDINGS: Unchanged left upper extremity PICC line with tip at the cavoatrial junction. The heart size and mediastinal contours are within normal limits. Unchanged small to moderate left pleural effusion with left lower lobe collapse/consolidation. Unchanged trace right pleural effusion and right basilar atelectasis. No pneumothorax. No acute osseous abnormality. IMPRESSION: 1. Unchanged bilateral pleural effusions and left lower lobe collapse/consolidation. Electronically Signed   By: Titus Dubin M.D.   On: 11/14/2021 08:54   DG Chest 2 View  Result Date: 11/12/2021 CLINICAL DATA:  Pleural effusion, lung carcinoma EXAM: CHEST - 2 VIEW COMPARISON:  Previous studies including the examination of 11/11/2021 FINDINGS: Transverse diameter of heart is increased. There is moderate left pleural effusion. Evaluation of left lower lung fields for infiltrates is limited by the effusion. There are small patchy infiltrates in right lower lung fields which were not evident in the previous study. There is no pneumothorax. There is blunting of right lateral CP angle. Tip of PICC line is seen in the superior vena cava. IMPRESSION: Moderate left pleural effusion. Small right pleural effusion. Possible atelectasis/pneumonia in the lower lung fields, more so on the left side. Electronically Signed   By: Elmer Picker M.D.   On: 11/12/2021 10:31   MR BRAIN WO CONTRAST  Result Date: 11/03/2021 CLINICAL DATA:  Initial evaluation for non-small cell lung cancer, staging. EXAM: MRI HEAD WITHOUT CONTRAST TECHNIQUE: Multiplanar, multiecho pulse sequences of the brain and surrounding structures were obtained without intravenous contrast. COMPARISON:  Prior head CT from 07/29/2016. FINDINGS:  Brain: Cerebral volume within normal limits. Scattered subcentimeter foci of T2/FLAIR hyperintensity involving the periventricular, deep, and subcortical white matter both  cerebral hemispheres, nonspecific, but most likely related chronic microvascular ischemic disease, mild in nature. No evidence for acute or subacute infarct. Gray-white matter differentiation maintained. No encephalomalacia to suggest chronic cortical infarction. Small extra-axial collection overlying the left parieto-occipital convexity measures up to 5 mm, likely a small subacute left subdural hematoma (series 11, image 14). No significant mass effect. No other acute or chronic intracranial blood products. No mass lesion, midline shift or mass effect. No evidence for intracranial metastatic disease. No hydrocephalus. Pituitary gland suprasellar region within normal limits. Midline structures intact. Vascular: Major intracranial vascular flow voids are maintained. Skull and upper cervical spine: Craniocervical junction within normal limits. Question T1 hypointense lesion measuring approximately 1 cm within the C4 vertebral body, nonspecific, but could reflect an osseous metastasis (series 9, image 12). No other definite focal marrow replacing lesion. Small focus of FLAIR hyperintensity noted involving the left occipital scalp, superficial to the underlying left extra-axial hemorrhage, suspected to reflect an evolving small soft tissue contusion (series 11, image 15). Scalp soft tissues otherwise unremarkable. Sinuses/Orbits: Globes and orbital soft tissues within normal limits. Paranasal sinuses are clear. No mastoid effusion. Other: None. IMPRESSION: 1. 5 mm extra-axial collection overlying the left parieto-occipital convexity, likely a small subdural hematoma, likely subacute. No associated mass effect. 2. No other acute intracranial abnormality. No evidence for intracranial metastatic disease. 3. 1 cm T1 hypointense lesion/area within the C4 vertebral body, nonspecific and possibly degenerative, although an osseous metastasis could also have this appearance. Follow-up examination with dedicated MRI of the  cervical spine suggested for further evaluation. 4. Mild chronic microvascular ischemic disease. Critical Value/emergent results were called by telephone at the time of interpretation on 11/03/2021 at 11:32 pm to provider JARED GARDNER , who verbally acknowledged these results. Electronically Signed   By: Jeannine Boga M.D.   On: 11/03/2021 23:34   US BIOPSY (LIVER)  Result Date: 10/29/2021 INDICATION: History of colon and uterine cancer now with liver lesions worrisome for metastatic disease. Please perform ultrasound-guided biopsy for tissue diagnostic purposes. EXAM: ULTRASOUND GUIDED LIVER LESION BIOPSY COMPARISON:  CT of the chest, abdomen pelvis-10/18/2021 MEDICATIONS: None ANESTHESIA/SEDATION: Moderate (conscious) sedation was employed during this procedure as administered by the Interventional Radiology RN. A total of Versed 0.5 mg and Fentanyl 25 mcg was administered intravenously. Moderate Sedation Time: 15 minutes. The patient's level of consciousness and vital signs were monitored continuously by radiology nursing throughout the procedure under my direct supervision. COMPLICATIONS: None immediate. PROCEDURE: Informed written consent was obtained from the patient after a discussion of the risks, benefits and alternatives to treatment. The patient understands and consents the procedure. A timeout was performed prior to the initiation of the procedure. Ultrasound scanning was performed of the right upper abdominal quadrant demonstrates innumerable hypoechoic hepatic lesions many more than were appreciated on preceding abdominal CT. A dominant approximately 3.8 x 3.0 cm hypoechoic mass within the caudal aspect the right lobe of the liver (image 14) was targeted for biopsy given location and sonographic window. The procedure was planned. The right upper abdominal quadrant was prepped and draped in the usual sterile fashion. The overlying soft tissues were anesthetized with 1% lidocaine with  epinephrine. A 17 gauge, 6.8 cm co-axial needle was advanced into a peripheral aspect of the lesion. This was followed by 7 core biopsies with an 18 gauge core device under direct ultrasound guidance. The  coaxial needle tract was embolized with a small amount of Gel-Foam slurry and superficial hemostasis was obtained with manual compression. Post procedural scanning was negative for definitive area of hemorrhage or additional complication. A dressing was placed. The patient tolerated the procedure well without immediate post procedural complication. IMPRESSION: 1. Technically successful ultrasound guided core needle biopsy of dominant mass within the caudal aspect of the right lobe liver. 2. Note, innumerable liver lesions were identified on today's procedure, many more than were questioned on preceding abdominal CT. Electronically Signed   By: Sandi Mariscal M.D.   On: 10/29/2021 10:20   DG Chest Port 1 View  Result Date: 11/11/2021 CLINICAL DATA:  Sudden shortness of breath beginning this morning. Patient began chemotherapy on 11/05/2021 for colon carcinoma. Also recent diagnosis of small cell lung carcinoma. EXAM: PORTABLE CHEST 1 VIEW COMPARISON:  11/19/2010.  CT, 10/18/2021 FINDINGS: There is opacity extending from the left perihilar region to the left lung base, obscuring the left hemidiaphragm and portions of the left heart border, consistent with a combination of a moderate effusion and parenchymal lung opacity. The lung opacity is consistent with the known left lower lobe malignancy likely with associated atelectasis. Lung volumes are low. Minor opacity noted at the right lung base consistent with atelectasis. Remainder of the right lung is clear. No pneumothorax. Cardiac silhouette normal in size. Left-sided PICC is well positioned, tip in the lower superior vena cava. Skeletal structures are grossly intact. IMPRESSION: 1. Moderate left pleural effusion with associated left perihilar and lower lung  opacity, the latter consistent with a combination of the patient's known malignancy and probable atelectasis. The pleural effusion has significantly increased when compared to the prior CT scan from 10/18/2021. 2. No evidence of pulmonary edema. Electronically Signed   By: Lajean Manes M.D.   On: 11/11/2021 11:34   ECHOCARDIOGRAM COMPLETE  Result Date: 11/15/2021    ECHOCARDIOGRAM REPORT   Patient Name:   AYERIM BERQUIST Date of Exam: 11/15/2021 Medical Rec #:  [id number]    Height:       62.0 in Accession #:    [id number]   Weight:       218.0 lb Date of Birth:  11-07-1960    BSA:          1.983 m Patient Age:    76 years     BP:           126/55 mmHg Patient Gender: F            HR:           96 bpm. Exam Location:  Inpatient Procedure: 2D Echo, Cardiac Doppler and Color Doppler Indications:    Orthopnea  History:        Patient has no prior history of Echocardiogram examinations.                 Signs/Symptoms:Edema; Risk Factors:Diabetes.  Sonographer:    Luisa Hart RDCS Referring Phys: [id number] Charlesetta Ivory GONFA  Sonographer Comments: Technically difficult study due to poor echo windows. IMPRESSIONS  1. Left ventricular ejection fraction, by estimation, is 60 to 65%. Left ventricular ejection fraction by 2D MOD biplane is 60.8 %. The left ventricle has normal function. The left ventricle has no regional wall motion abnormalities. There is moderate left ventricular hypertrophy. Left ventricular diastolic parameters are consistent with Grade I diastolic dysfunction (impaired relaxation).  2. Right ventricular systolic function is normal. The right ventricular size is normal. Tricuspid regurgitation signal is inadequate  for assessing PA pressure.  3. The mitral valve is grossly normal. No evidence of mitral valve regurgitation.  4. The aortic valve was not well visualized. Aortic valve regurgitation is not visualized.  5. The inferior vena cava is dilated in size with <50% respiratory variability, suggesting right  atrial pressure of 15 mmHg. Comparison(s): No prior Echocardiogram. FINDINGS  Left Ventricle: Left ventricular ejection fraction, by estimation, is 60 to 65%. Left ventricular ejection fraction by 2D MOD biplane is 60.8 %. The left ventricle has normal function. The left ventricle has no regional wall motion abnormalities. The left ventricular internal cavity size was normal in size. There is moderate left ventricular hypertrophy. Left ventricular diastolic parameters are consistent with Grade I diastolic dysfunction (impaired relaxation). Indeterminate filling pressures. Right Ventricle: The right ventricular size is normal. No increase in right ventricular wall thickness. Right ventricular systolic function is normal. Tricuspid regurgitation signal is inadequate for assessing PA pressure. Left Atrium: Left atrial size was normal in size. Right Atrium: Right atrial size was normal in size. Pericardium: There is no evidence of pericardial effusion. Mitral Valve: The mitral valve is grossly normal. No evidence of mitral valve regurgitation. Tricuspid Valve: The tricuspid valve is normal in structure. Tricuspid valve regurgitation is not demonstrated. Aortic Valve: The aortic valve was not well visualized. Aortic valve regurgitation is not visualized. Aortic valve mean gradient measures 6.0 mmHg. Aortic valve peak gradient measures 9.7 mmHg. Aortic valve area, by VTI measures 3.20 cm. Pulmonic Valve: The pulmonic valve was grossly normal. Pulmonic valve regurgitation is not visualized. Aorta: The aortic root and ascending aorta are structurally normal, with no evidence of dilitation. Venous: The inferior vena cava is dilated in size with less than 50% respiratory variability, suggesting right atrial pressure of 15 mmHg. IAS/Shunts: No atrial level shunt detected by color flow Doppler. Additional Comments: There is pleural effusion in the left lateral region.  LEFT VENTRICLE PLAX 2D                        Biplane EF  (MOD) LVIDd:         2.90 cm         LV Biplane EF:   Left LVIDs:         2.90 cm                          ventricular LV PW:         1.40 cm                          ejection LV IVS:        1.60 cm                          fraction by LVOT diam:     2.20 cm                          2D MOD LV SV:         88                               biplane is LV SV Index:   44  60.8 %. LVOT Area:     3.80 cm                                Diastology                                LV e' medial:    6.74 cm/s LV Volumes (MOD)               LV E/e' medial:  14.6 LV vol d, MOD    62.1 ml       LV e' lateral:   9.25 cm/s A2C:                           LV E/e' lateral: 10.6 LV vol d, MOD    40.8 ml A4C: LV vol s, MOD    24.1 ml A2C: LV vol s, MOD    15.1 ml A4C: LV SV MOD A2C:   38.0 ml LV SV MOD A4C:   40.8 ml LV SV MOD BP:    30.7 ml RIGHT VENTRICLE RV Basal diam:  4.10 cm RV Mid diam:    3.70 cm TAPSE (M-mode): 2.2 cm LEFT ATRIUM             Index LA Vol (A2C):   47.7 ml 24.05 ml/m LA Vol (A4C):   36.1 ml 18.20 ml/m LA Biplane Vol: 41.9 ml 21.13 ml/m  AORTIC VALVE                     PULMONIC VALVE AV Area (Vmax):    2.85 cm      PV Vmax:       1.08 m/s AV Area (Vmean):   2.85 cm      PV Vmean:      61.600 cm/s AV Area (VTI):     3.20 cm      PV VTI:        0.184 m AV Vmax:           156.00 cm/s   PV Peak grad:  4.6 mmHg AV Vmean:          113.000 cm/s  PV Mean grad:  2.0 mmHg AV VTI:            0.276 m AV Peak Grad:      9.7 mmHg AV Mean Grad:      6.0 mmHg LVOT Vmax:         117.00 cm/s LVOT Vmean:        84.700 cm/s LVOT VTI:          0.232 m LVOT/AV VTI ratio: 0.84  AORTA Ao Root diam: 2.80 cm MITRAL VALVE MV Area (PHT): 6.71 cm    SHUNTS MV Decel Time: 113 msec    Systemic VTI:  0.23 m MV E velocity: 98.50 cm/s  Systemic Diam: 2.20 cm MV A velocity: 74.70 cm/s MV E/A ratio:  1.32 Lyman Bishop MD Electronically signed by Lyman Bishop MD Signature Date/Time: 11/15/2021/3:33:30 PM    Final     IR IMAGING GUIDED PORT INSERTION  Result Date: 11/20/2021 INDICATION: Small cell lung cancer, chemotherapy EXAM: Ultrasound-guided puncture of the right internal jugular vein Placement of a right-sided chest port using fluoroscopic guidance MEDICATIONS: None ANESTHESIA/SEDATION: Moderate (conscious) sedation was employed during this procedure. A total  of Versed 2.5 mg and Fentanyl 100 mcg was administered intravenously. Moderate Sedation Time: 31 minutes. The patient's level of consciousness and vital signs were monitored continuously by radiology nursing throughout the procedure under my direct supervision. FLUOROSCOPY TIME:  Fluoroscopy Time: 1.2 minutes (12 mGy) COMPLICATIONS: None immediate. PROCEDURE: Informed written consent was obtained from the patient after a thorough discussion of the procedural risks, benefits and alternatives. All questions were addressed. Maximal Sterile Barrier Technique was utilized including caps, mask, sterile gowns, sterile gloves, sterile drape, hand hygiene and skin antiseptic. A timeout was performed prior to the initiation of the procedure. The patient was placed supine on the exam table. The right neck and chest was prepped and draped in the standard sterile fashion. A preliminary ultrasound of the right neck was performed and demonstrates a patent right internal jugular vein. A permanent ultrasound image was stored in the electronic medical record. The overlying skin was anesthetized with 1% Lidocaine. Using ultrasound guidance, access was obtained into the right internal jugular vein using a 21 gauge micropuncture set. A wire was advanced into the SVC, a short incision was made at the puncture site, and serial dilatation performed. Next, in an ipsilateral infraclavicular location, an incision was made at the site of the subcutaneous reservoir. Blunt dissection was used to open a pocket to contain the reservoir. A subcutaneous tunnel was then created from the port site  to the puncture site. A(n) 8 Fr single lumen catheter was advanced through the tunnel. The catheter was attached to the port and this was placed in the subcutaneous pocket. Under fluoroscopic guidance, a peel away sheath was placed, and the catheter was trimmed to the appropriate length and was advanced into the central veins. The catheter length is 23 cm. The tip of the catheter lies near the superior cavoatrial junction. The port flushes and aspirates appropriately. The port was flushed and locked with heparinized saline. The port pocket was closed in 2 layers using 3-0 and 4-0 Vicryl/absorbable suture. The venotomy incision was closed with 4-0 Vicryl. Dermabond was also applied to both incisions. The patient tolerated the procedure well and was transferred to recovery in stable condition. IMPRESSION: Successful placement of a right chest port via the right internal jugular vein. The port is ready for immediate use. Electronically Signed   By: Albin Felling M.D.   On: 11/20/2021 10:19   VAS [name] LOWER EXTREMITY VENOUS (DVT)  Result Date: 11/05/2021  Lower Venous DVT Study Patient Name:  ERCEL NORMOYLE  Date of Exam:   11/04/2021 Medical Rec #: [id number]     Accession #:    [id number] Date of Birth: 07-26-1961     Patient Gender: F Patient Age:   87 years Exam Location:  [facility] Procedure:      VAS [name] LOWER EXTREMITY VENOUS (DVT) Referring Phys: Annamaria Boots XU --------------------------------------------------------------------------------  Indications: Swelling.  Risk Factors: Cancer Small cell lung with liver mets. Limitations: Significant edema. Comparison Study: No prior study on file Performing Technologist: Sharion Dove RVS  Examination Guidelines: A complete evaluation includes B-mode imaging, spectral Doppler, color Doppler, and power Doppler as needed of all accessible portions of each vessel. Bilateral testing is considered an integral part of a complete examination. Limited examinations for  reoccurring indications may be performed as noted. The reflux portion of the exam is performed with the patient in reverse Trendelenburg.  +---------+---------------+---------+-----------+----------+-------------------+  RIGHT     Compressibility Phasicity Spontaneity Properties Thrombus Aging       +---------+---------------+---------+-----------+----------+-------------------+  CFV       Full                                             pulsatile            +---------+---------------+---------+-----------+----------+-------------------+  SFJ       Full                                                                  +---------+---------------+---------+-----------+----------+-------------------+  FV Prox   Full                                                                  +---------+---------------+---------+-----------+----------+-------------------+  FV Mid    Full                                                                  +---------+---------------+---------+-----------+----------+-------------------+  FV Distal                                                  patent by color and                                                              Doppler              +---------+---------------+---------+-----------+----------+-------------------+  PFV       Full                                                                  +---------+---------------+---------+-----------+----------+-------------------+  POP       Full            No        No                                          +---------+---------------+---------+-----------+----------+-------------------+  PTV       Full                                                                  +---------+---------------+---------+-----------+----------+-------------------+  PERO      Full                                                                  +---------+---------------+---------+-----------+----------+-------------------+    +---------+---------------+---------+-----------+----------+--------------+  LEFT      Compressibility Phasicity Spontaneity Properties Thrombus Aging  +---------+---------------+---------+-----------+----------+--------------+  CFV       Full                                             pulsatile       +---------+---------------+---------+-----------+----------+--------------+  SFJ       Full                                                             +---------+---------------+---------+-----------+----------+--------------+  FV Prox   Full                                                             +---------+---------------+---------+-----------+----------+--------------+  FV Mid    Full                                                             +---------+---------------+---------+-----------+----------+--------------+  FV Distal Full                                                             +---------+---------------+---------+-----------+----------+--------------+  PFV       Full                                                             +---------+---------------+---------+-----------+----------+--------------+  POP       Full            No        No                                     +---------+---------------+---------+-----------+----------+--------------+  PTV       Full                                                             +---------+---------------+---------+-----------+----------+--------------+  PERO      Full                                                             +---------+---------------+---------+-----------+----------+--------------+     Summary: BILATERAL: - No evidence of deep vein thrombosis seen in the lower extremities, bilaterally. -No evidence of popliteal cyst, bilaterally.   *See table(s) above for measurements and observations. Electronically signed by Monica Martinez MD on 11/05/2021 at 2:35:15 PM.    Final    IR PICC PLACEMENT LEFT >5 YRS INC IMG GUIDE  Result  Date: 11/03/2021 INDICATION: Patient with a history of small cell lung cancer and need of durable venous access for chemotherapy. Interventional radiology asked to place a PICC. EXAM: ULTRASOUND AND FLUOROSCOPIC GUIDED PICC LINE INSERTION MEDICATIONS: 1% lidocaine, 1 mL CONTRAST:  None FLUOROSCOPY TIME:  1 minute 36 seconds  (9  mGy) COMPLICATIONS: None immediate. TECHNIQUE: The procedure, risks, benefits, and alternatives were explained to the patient and informed written consent was obtained. The left upper extremity was prepped with chlorhexidine in a sterile fashion, and a sterile drape was applied covering the operative field. Maximum barrier sterile technique with sterile gowns and gloves were used for the procedure. A timeout was performed prior to the initiation of the procedure. Local anesthesia was provided with 1% lidocaine. After the overlying soft tissues were anesthetized with 1% lidocaine, a micropuncture kit was utilized to access the left brachial vein. Real-time ultrasound guidance was utilized for vascular access including the acquisition of a permanent ultrasound image documenting patency of the accessed vessel. A guidewire was advanced to the level of the superior caval-atrial junction for measurement purposes and the PICC line was cut to length. A peel-away sheath was placed and a 43 cm, 5 [place], dual lumen was inserted to level of the superior caval-atrial junction. A post procedure spot fluoroscopic was obtained. The catheter easily aspirated and flushed and was secured in place. A dressing was placed. The patient tolerated the procedure well without immediate post procedural complication. PICC line placed by Dr. Markus Daft FINDINGS: After catheter placement, the tip lies within the superior cavoatrial junction. The catheter aspirates and flushes normally and is ready for immediate use. IMPRESSION: Successful ultrasound and fluoroscopic guided placement of a left brachial vein approach, 43  cm, 5 French, dual lumen PICC with tip at the superior caval-atrial junction. The PICC line is ready for immediate use. Read by: Soyla Dryer, NP Electronically Signed   By: Markus Daft M.D.   On: 11/03/2021 17:20    Microbiology: Results for orders placed or performed during the hospital encounter of 11/03/21  Resp Panel by RT-PCR (Flu A&B, Covid) Nasopharyngeal Swab     Status: None   Collection Time: 11/03/21 11:50 AM   Specimen: Nasopharyngeal Swab; Nasopharyngeal(NP) swabs in vial transport medium  Result Value Ref Range Status   SARS Coronavirus 2 by RT PCR NEGATIVE NEGATIVE Final    Comment: (NOTE) SARS-CoV-2 target nucleic acids are NOT DETECTED.  The SARS-CoV-2 RNA is generally detectable in upper respiratory specimens during the acute phase of infection. The lowest concentration of SARS-CoV-2 viral copies this assay can detect is 138 copies/mL. A negative result does not preclude SARS-Cov-2 infection and should not be used as the sole basis for treatment or other patient  management decisions. A negative result may occur with  improper specimen collection/handling, submission of specimen other than nasopharyngeal swab, presence of viral mutation(s) within the areas targeted by this assay, and inadequate number of viral copies(<138 copies/mL). A negative result must be combined with clinical observations, patient history, and epidemiological information. The expected result is Negative.  Fact Sheet for Patients:  EntrepreneurPulse.com.au  Fact Sheet for Healthcare Providers:  IncredibleEmployment.be  This test is no t yet approved or cleared by the [place] FDA and  has been authorized for detection and/or diagnosis of SARS-CoV-2 by FDA under an Emergency Use Authorization (EUA). This EUA will remain  in effect (meaning this test can be used) for the duration of the COVID-19 declaration under Section 564(b)(1) of the Act,  21 U.S.C.section 360bbb-3(b)(1), unless the authorization is terminated  or revoked sooner.       Influenza A by PCR NEGATIVE NEGATIVE Final   Influenza B by PCR NEGATIVE NEGATIVE Final    Comment: (NOTE) The Xpert Xpress SARS-CoV-2/FLU/RSV plus assay is intended as an aid in the diagnosis of influenza from Nasopharyngeal swab specimens and should not be used as a sole basis for treatment. Nasal washings and aspirates are unacceptable for Xpert Xpress SARS-CoV-2/FLU/RSV testing.  Fact Sheet for Patients: EntrepreneurPulse.com.au  Fact Sheet for Healthcare Providers: IncredibleEmployment.be  This test is not yet approved or cleared by the [place] FDA and has been authorized for detection and/or diagnosis of SARS-CoV-2 by FDA under an Emergency Use Authorization (EUA). This EUA will remain in effect (meaning this test can be used) for the duration of the COVID-19 declaration under Section 564(b)(1) of the Act, 21 U.S.C. section 360bbb-3(b)(1), unless the authorization is terminated or revoked.  Performed at [facility], [place] [address] [place], [place] [address]   Aerobic Culture w Gram Stain (superficial specimen)     Status: None   Collection Time: 11/18/21  9:20 AM   Specimen: Wound  Result Value Ref Range Status   Specimen Description   Final    WOUND Performed at [facility] [address] [place], [place] [address]    Special Requests   Final    Immunocompromised Performed at [facility], [place] [address], [place], [place] [address]    Gram Stain NO WBC SEEN NO ORGANISMS SEEN   Final   Culture   Final    NO GROWTH 2 DAYS Performed at [facility], [place] [address] [place], [place] [address]    Report Status 11/20/2021 FINAL  Final    Labs: CBC: Recent Labs  Lab 11/15/21 0500 11/16/21 0545 11/17/21 0551 11/18/21 0525 11/19/21 0500 11/20/21 0730 11/21/21 0500   WBC 0.6* 0.5* 0.9* 3.1* 10.3 11.8* 11.1*  NEUTROABS 0.2* 0.1* 0.1* 1.6* 6.0  --   --   HGB 11.2* 11.2* 10.6* 10.3* 10.5* 10.3* 9.8*  HCT 34.3* 35.1* 32.5* 31.2* 32.2* 31.3* 30.2*  MCV 93.7 93.6 92.3 91.8 91.7 91.8 93.5  PLT 22* 13* 8* 17* 43* 70* 616*   Basic Metabolic Panel: Recent Labs  Lab 11/16/21 0545 11/17/21 0551 11/18/21 0525 11/19/21 0500 11/19/21 0850 11/20/21 0500 11/20/21 0730 11/21/21 0500  NA 133* 130* 128*  --  130*  --  131* 136  K 3.1* 3.3* 3.1*  --  3.4*  --  3.2* 3.4*  CL 91* 88* 85*  --  86*  --  85* 93*  CO2 32 32 34*  --  35*  --  36* 35*  GLUCOSE 144* 145* 158*  --  239*  --  166* 112*  BUN '16 18 18  ' --  19  --  24* 21  CREATININE 0.58 0.61 0.73 0.67 0.80  --  0.74 0.71  CALCIUM 7.6* 7.6* 7.8*  --  8.2*  --  8.4* 7.8*  MG 1.6* 1.9 1.5* 2.0  --  1.9  --  1.7  PHOS 2.8  --  2.9 2.8  --  3.9  --  4.2   Liver Function Tests: Recent Labs  Lab 11/17/21 0551 11/18/21 0525 11/19/21 0850 11/20/21 0730 11/21/21 0500  AST 68* 55* 61* 58* 57*  ALT 41 35 37 34 34  ALKPHOS 208* 172* 215* 194* 191*  BILITOT 1.5* 1.4* 1.2 0.9 0.8  PROT 5.8* 5.9* 6.2* 6.0* 5.5*  ALBUMIN 2.5* 3.0* 3.0* 2.8* 2.4*   CBG: Recent Labs  Lab 11/20/21 0721 11/20/21 1213 11/20/21 1653 11/21/21 0729 11/21/21 1139  GLUCAP 161* 131* 161* 147* 172*    Discharge time spent: 40 minutes.  Signed: Edwin Dada, MD [facility] 11/21/2021

## 2021-11-21 NOTE — Discharge Planning (Signed)
0700-1700 ?Patient's medication list and discharge instructions went over with patient. Patient educated on new medications and follow up appointments. Patient's picc line pulled and home oxygen delivered to room for home use. Patient wheeled down via wheelchair to lobby for transportation with all belongings. ?

## 2021-11-21 NOTE — Progress Notes (Signed)
Occupational Therapy Treatment ?Patient Details ?Name: Pamela [name] ?MRN: [id number] ?DOB: 10-11-1960 ?Today's Date: 11/21/2021 ? ? ?History of present illness Pt is 61 yo female admitted on 11/03/21 with worsening neutropenia, bil LE ?cellulitis, pleural effusion, pancytopenia.  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  Pt also with DM2, HTN, HLD, and hypothroidism. ?  ?OT comments ? Upon arrival patient in bathroom. Patient is independent with peri care and clothing management, mod I with rolling walker to ambulate from bathroom. Patient denies any concerns/questions about D/C home and managing self care tasks. Patient has progressed and met all acute OT goals. OT to D/C.   ? ?Recommendations for follow up therapy are one component of a multi-disciplinary discharge planning process, led by the attending physician.  Recommendations may be updated based on patient status, additional functional criteria and insurance authorization. ?   ?Follow Up Recommendations ? No OT follow up  ?  ?Assistance Recommended at Discharge PRN  ?   ?Equipment Recommendations ? None recommended by OT  ?  ?   ?Precautions / Restrictions Precautions ?Precaution Comments: Protective Precautions ?Restrictions ?Weight Bearing Restrictions: No  ? ? ?  ? ?Mobility Bed Mobility ?  ?  ?  ?  ?  ?  ?  ?General bed mobility comments: OOB upon arrival ?  ? ?Transfers ?Overall transfer level: Modified independent ?  ?  ?  ?  ?  ?  ?  ?  ?  ?  ?  ?Balance Overall balance assessment: Modified Independent ?  ?  ?  ?  ?  ?  ?  ?  ?  ?  ?  ?  ?  ?  ?  ?  ?  ?  ?   ? ?ADL either performed or assessed with clinical judgement  ? ?ADL Overall ADL's : Modified independent ?  ?  ?  ?  ?  ?  ?  ?  ?  ?  ?  ?  ?Toilet Transfer: Modified Independent;Ambulation;Rolling walker (2 wheels) ?  ?Toileting- Clothing Manipulation and Hygiene: Independent;Sitting/lateral lean;Sit to/from stand ?  ?  ?  ?Functional mobility during ADLs: Modified independent;Rolling walker  (2 wheels) ?  ?  ? ? ? ?Cognition Arousal/Alertness: Awake/alert ?Behavior During Therapy: [facility] for tasks assessed/performed ?Overall Cognitive Status: Within Functional Limits for tasks assessed ?  ?  ?  ?  ?  ?  ?  ?  ?  ?  ?  ?  ?  ?  ?  ?  ?General Comments: Pt very pleasant and motivated ?  ?  ?   ?   ?   ?   ? ? ?Pertinent Vitals/ Pain       Pain Assessment ?Pain Assessment: Faces ?Faces Pain Scale: No hurt ? ? ?Progress Toward Goals ? ?OT Goals(current goals can now be found in the care plan section) ? Progress towards OT goals: Goals met/education completed, patient discharged from OT ? ?Acute Rehab OT Goals ?Patient Stated Goal: Home today ?OT Goal Formulation: All assessment and education complete, DC therapy  ?Plan All goals met and education completed, patient discharged from OT services   ? ?   ?AM-PAC OT "6 Clicks" Daily Activity     ?Outcome Measure ? ? Help from another person eating meals?: None ?Help from another person taking care of personal grooming?: None ?Help from another person toileting, which includes using toliet, bedpan, or urinal?: None ?Help from another person bathing (including washing,  rinsing, drying)?: None ?Help from another person to put on and taking off regular upper body clothing?: None ?Help from another person to put on and taking off regular lower body clothing?: None ?6 Click Score: 24 ? ?  ?End of Session Equipment Utilized During Treatment: Rolling walker (2 wheels) ? ?OT Visit Diagnosis: Unsteadiness on feet (R26.81) ?  ?Activity Tolerance Patient tolerated treatment well ?  ?Patient Left with family/visitor present ?  ?Nurse Communication Mobility status ?  ? ?   ? ?Time: 9094-0005 ?OT Time Calculation (min): 9 min ? ?Charges: OT General Charges ?$OT Visit: 1 Visit ?OT Treatments ?$Self Care/Home Management : 8-22 mins ? ?Delbert Phenix OT ?OT pager: [phone] ? ? ?[name] ?11/21/2021, 9:43 AM ?

## 2021-11-22 ENCOUNTER — Ambulatory Visit: Payer: BC Managed Care – PPO

## 2021-11-22 ENCOUNTER — Other Ambulatory Visit: Payer: BC Managed Care – PPO

## 2021-11-23 ENCOUNTER — Other Ambulatory Visit: Payer: Self-pay | Admitting: Hematology & Oncology

## 2021-11-23 ENCOUNTER — Other Ambulatory Visit: Payer: Self-pay | Admitting: *Deleted

## 2021-11-23 MED ORDER — SILVER SULFADIAZINE 1 % EX CREA: TOPICAL_CREAM | CUTANEOUS | 2 refills | Status: DC

## 2021-11-26 ENCOUNTER — Inpatient Hospital Stay: Payer: BC Managed Care – PPO

## 2021-11-26 ENCOUNTER — Inpatient Hospital Stay: Payer: BC Managed Care – PPO | Attending: Oncology

## 2021-11-26 ENCOUNTER — Other Ambulatory Visit: Payer: Self-pay

## 2021-11-26 ENCOUNTER — Inpatient Hospital Stay: Payer: BC Managed Care – PPO | Admitting: Hematology & Oncology

## 2021-11-26 ENCOUNTER — Encounter: Payer: Self-pay | Admitting: *Deleted

## 2021-11-26 ENCOUNTER — Encounter: Payer: Self-pay | Admitting: Hematology & Oncology

## 2021-11-26 VITALS — BP 124/40 | HR 89 | Temp 97.9°F | Resp 24 | Wt 203.0 lb

## 2021-11-26 DIAGNOSIS — C3490 Malignant neoplasm of unspecified part of unspecified bronchus or lung: Secondary | ICD-10-CM

## 2021-11-26 DIAGNOSIS — Z5189 Encounter for other specified aftercare: Secondary | ICD-10-CM | POA: Insufficient documentation

## 2021-11-26 DIAGNOSIS — Z5112 Encounter for antineoplastic immunotherapy: Secondary | ICD-10-CM | POA: Diagnosis not present

## 2021-11-26 DIAGNOSIS — C3492 Malignant neoplasm of unspecified part of left bronchus or lung: Secondary | ICD-10-CM | POA: Diagnosis not present

## 2021-11-26 DIAGNOSIS — Z5111 Encounter for antineoplastic chemotherapy: Secondary | ICD-10-CM | POA: Diagnosis present

## 2021-11-26 DIAGNOSIS — E790 Hyperuricemia without signs of inflammatory arthritis and tophaceous disease: Secondary | ICD-10-CM | POA: Diagnosis not present

## 2021-11-26 LAB — CMP (CANCER CENTER ONLY)
ALT: 26 U/L (ref 0–44)
AST: 52 U/L — ABNORMAL HIGH (ref 15–41)
Albumin: 3.8 g/dL (ref 3.5–5.0)
Alkaline Phosphatase: 198 U/L — ABNORMAL HIGH (ref 38–126)
Anion gap: 11 (ref 5–15)
BUN: 18 mg/dL (ref 8–23)
CO2: 33 mmol/L — ABNORMAL HIGH (ref 22–32)
Calcium: 9.2 mg/dL (ref 8.9–10.3)
Chloride: 94 mmol/L — ABNORMAL LOW (ref 98–111)
Creatinine: 0.76 mg/dL (ref 0.44–1.00)
GFR, Estimated: >60 mL/min (ref >60)
Glucose, Bld: 166 mg/dL — ABNORMAL HIGH (ref 70–99)
Potassium: 3 mmol/L — ABNORMAL LOW (ref 3.5–5.1)
Sodium: 138 mmol/L (ref 135–145)
Total Bilirubin: 1 mg/dL (ref 0.3–1.2)
Total Protein: 5.9 g/dL — ABNORMAL LOW (ref 6.5–8.1)

## 2021-11-26 LAB — CBC WITH DIFFERENTIAL (CANCER CENTER ONLY)
Abs Immature Granulocytes: 1.18 10*3/uL — ABNORMAL HIGH (ref 0.00–0.07)
Basophils Absolute: 0.1 10*3/uL (ref 0.0–0.1)
Basophils Relative: 1 %
Eosinophils Absolute: 0 10*3/uL (ref 0.0–0.5)
Eosinophils Relative: 0 %
HCT: 31.7 % — ABNORMAL LOW (ref 36.0–46.0)
Hemoglobin: 10.1 g/dL — ABNORMAL LOW (ref 12.0–15.0)
Immature Granulocytes: 10 %
Lymphocytes Relative: 17 %
Lymphs Abs: 2 10*3/uL (ref 0.7–4.0)
MCH: 30.2 pg (ref 26.0–34.0)
MCHC: 31.9 g/dL (ref 30.0–36.0)
MCV: 94.9 fL (ref 80.0–100.0)
Monocytes Absolute: 1.1 10*3/uL — ABNORMAL HIGH (ref 0.1–1.0)
Monocytes Relative: 10 %
Neutro Abs: 7.1 10*3/uL (ref 1.7–7.7)
Neutrophils Relative %: 62 %
Platelet Count: 334 10*3/uL (ref 150–400)
RBC: 3.34 MIL/uL — ABNORMAL LOW (ref 3.87–5.11)
RDW: 15.5 % (ref 11.5–15.5)
Smear Review: NORMAL
WBC Count: 11.6 10*3/uL — ABNORMAL HIGH (ref 4.0–10.5)
nRBC: 0.3 % — ABNORMAL HIGH (ref 0.0–0.2)

## 2021-11-26 LAB — LACTATE DEHYDROGENASE: LDH: 558 U/L — ABNORMAL HIGH (ref 98–192)

## 2021-11-26 LAB — SAMPLE TO BLOOD BANK

## 2021-11-26 LAB — GLUCOSE, CAPILLARY: Glucose-Capillary: 211 mg/dL — ABNORMAL HIGH (ref 70–99)

## 2021-11-26 MED ORDER — SODIUM CHLORIDE 0.9 % IV SOLN
650.0000 mg | Freq: Once | INTRAVENOUS | Status: AC
  Administered 2021-11-26: 650 mg via INTRAVENOUS
  Filled 2021-11-26: qty 65

## 2021-11-26 MED ORDER — SODIUM CHLORIDE 0.9% FLUSH
10.0000 mL | INTRAVENOUS | Status: DC | PRN
  Administered 2021-11-26: 10 mL

## 2021-11-26 MED ORDER — SODIUM CHLORIDE 0.9 % IV SOLN
10.0000 mg | Freq: Once | INTRAVENOUS | Status: AC
  Administered 2021-11-26: 10 mg via INTRAVENOUS
  Filled 2021-11-26: qty 10

## 2021-11-26 MED ORDER — ONDANSETRON HCL 8 MG PO TABS: 8.0000 mg | ORAL_TABLET | Freq: Two times a day (BID) | ORAL | 1 refills | Status: DC | PRN

## 2021-11-26 MED ORDER — PALONOSETRON HCL INJECTION 0.25 MG/5ML
0.2500 mg | Freq: Once | INTRAVENOUS | Status: AC
  Administered 2021-11-26: 0.25 mg via INTRAVENOUS
  Filled 2021-11-26: qty 5

## 2021-11-26 MED ORDER — SODIUM CHLORIDE 0.9 % IV SOLN
1200.0000 mg | Freq: Once | INTRAVENOUS | Status: AC
  Administered 2021-11-26: 1200 mg via INTRAVENOUS
  Filled 2021-11-26: qty 20

## 2021-11-26 MED ORDER — LORAZEPAM 0.5 MG PO TABS: 0.5000 mg | ORAL_TABLET | Freq: Four times a day (QID) | ORAL | 0 refills | Status: DC | PRN

## 2021-11-26 MED ORDER — SODIUM CHLORIDE 0.9 % IV SOLN
150.0000 mg | Freq: Once | INTRAVENOUS | Status: AC
  Administered 2021-11-26: 150 mg via INTRAVENOUS
  Filled 2021-11-26: qty 150

## 2021-11-26 MED ORDER — PROCHLORPERAZINE MALEATE 10 MG PO TABS: 10.0000 mg | ORAL_TABLET | Freq: Four times a day (QID) | ORAL | 1 refills | Status: DC | PRN

## 2021-11-26 MED ORDER — DEXAMETHASONE 4 MG PO TABS: 8.0000 mg | ORAL_TABLET | Freq: Every day | ORAL | 1 refills | Status: DC

## 2021-11-26 MED ORDER — SODIUM CHLORIDE 0.9 % IV SOLN: Freq: Once | INTRAVENOUS | Status: AC

## 2021-11-26 MED ORDER — HEPARIN SOD (PORK) LOCK FLUSH 100 UNIT/ML IV SOLN
500.0000 [IU] | Freq: Once | INTRAVENOUS | Status: AC | PRN
  Administered 2021-11-26: 500 [IU]

## 2021-11-26 MED ORDER — SODIUM CHLORIDE 0.9 % IV SOLN
90.0000 mg/m2 | Freq: Once | INTRAVENOUS | Status: AC
  Administered 2021-11-26: 190 mg via INTRAVENOUS
  Filled 2021-11-26: qty 9.5

## 2021-11-26 NOTE — Progress Notes (Signed)
Hematology and Oncology Follow Up Visit  Pamela [name] [id number] [birth year] 61 y.o. 11/26/2021   Principle Diagnosis:  Extensive stage small cell lung cancer  Current Therapy:   Status post cycle 1 of carboplatinum/etoposide/Tecentriq -given in the hospital on 11/05/2021     Interim History:  [name] is back for her first office visit.  I saw her in consultation was on the weekend.  She came in from [place].  She had impending renal failure and liver failure.  She had marked hyperuricemia.  She had LDH of about 3000.  She clearly had tumor lysis from rapidly growing small cell lung cancer.  We are able to get her treated quickly.  She received carboplatinum/etoposide.  We cannot give the Tecentriq in the hospital.  We gave her a marked dose reduction because of her renal and liver impairment.  She responded as expected.  Her renal function improved.  Her liver function improved.  Her hyperuricemia also normalized.  What did not improve was her leg swelling.  She had marked, marked edema in her legs.  I doubt this is all secondary to all the fluids that she got into the fact that her liver was so fully cancer that it could not allow blood to get through back to the heart.  She actually needed wound care to see her and to wrap her legs.  Again, she got through treatment quite nicely.  She did get neutropenic and thrombocytopenic.  We did give her Neupogen.  She had no mouth sores.  She ate well.  She had very little with nausea or vomiting.  She had no diarrhea.  I must say that she showed an incredible amount of toughness.  She had great support from her family.  She went home 2 days ago.  She been doing well at home.  Comes into the office now for her second cycle of treatment.  She has had no headache.  There is been no dysphagia or odynophagia.  Overall, I would say performance status is probably ECOG 1.  Medications:  Current Outpatient Medications:    albuterol (VENTOLIN  HFA) 108 (90 Base) MCG/ACT inhaler, 2 puffs every 6 (six) hours as needed for wheezing or shortness of breath., Disp: , Rfl:    allopurinol (ZYLOPRIM) 300 MG tablet, Take 1 tablet (300 mg total) by mouth daily., Disp: 30 tablet, Rfl: 3   esomeprazole (NEXIUM) 20 MG capsule, Take 20 mg by mouth daily at 12 noon., Disp: , Rfl:    ferrous sulfate 325 (65 FE) MG EC tablet, Take 325 mg by mouth daily., Disp: , Rfl:    furosemide (LASIX) 40 MG tablet, Take 1 tablet (40 mg total) by mouth daily., Disp: 30 tablet, Rfl: 11   levothyroxine (SYNTHROID) 125 MCG tablet, Take 0.5 tablets (62.5 mcg total) by mouth daily before breakfast., Disp: 15 tablet, Rfl: 1   magnesium oxide (MAG-OX) 400 MG tablet, Take 400 mg by mouth daily., Disp: , Rfl:    midodrine (PROAMATINE) 5 MG tablet, Take 1 tablet (5 mg total) by mouth 3 (three) times daily with meals., Disp: 90 tablet, Rfl: 0   Multiple Vitamin (MULTIVITAMIN) tablet, Take 1 tablet by mouth daily., Disp: , Rfl:    Oxycodone HCl 10 MG TABS, Take 1 tablet (10 mg total) by mouth every 3 (three) hours as needed (breakthrough pain)., Disp: 90 tablet, Rfl: 0   senna-docusate (SENOKOT-S) 8.6-50 MG tablet, Take 1 tablet by mouth 2 (two) times daily., Disp: , Rfl:  silver sulfADIAZINE (SILVADENE) 1 % cream, Apply Silvadene to bilat legs Q day, then cover with ABD pads and apply kerlex and an ace wrap in a spiral fashion, Disp: 50 g, Rfl: 2   simvastatin (ZOCOR) 40 MG tablet, Take 40 mg by mouth every evening., Disp: , Rfl:    sitaGLIPtin-metformin (JANUMET) 50-500 MG tablet, Take 1 tablet by mouth 2 (two) times daily with a meal., Disp: , Rfl:    tiZANidine (ZANAFLEX) 2 MG tablet, Take 2 mg by mouth every 8 (eight) hours as needed for muscle spasms., Disp: , Rfl:    dexamethasone (DECADRON) 4 MG tablet, Take 2 tablets (8 mg total) by mouth daily. Start the day after chemotherapy for 1 day., Disp: 30 tablet, Rfl: 1   fentaNYL (DURAGESIC) 12 MCG/HR, Place 1 patch onto the  skin every 3 (three) days. (Patient not taking: Reported on 11/26/2021), Disp: 5 patch, Rfl: 0   LORazepam (ATIVAN) 0.5 MG tablet, Take 1 tablet (0.5 mg total) by mouth every 6 (six) hours as needed (Nausea or vomiting)., Disp: 30 tablet, Rfl: 0   naloxone (NARCAN) nasal spray 4 mg/0.1 mL, 1 spray as needed (overdose). (Patient not taking: Reported on 11/26/2021), Disp: , Rfl:    ondansetron (ZOFRAN) 8 MG tablet, Take 1 tablet (8 mg total) by mouth 2 (two) times daily as needed for refractory nausea / vomiting. Start on day 3 after carboplatin chemo., Disp: 30 tablet, Rfl: 1   prochlorperazine (COMPAZINE) 10 MG tablet, Take 1 tablet (10 mg total) by mouth every 6 (six) hours as needed (Nausea or vomiting)., Disp: 30 tablet, Rfl: 1 No current facility-administered medications for this visit.  Facility-Administered Medications Ordered in Other Visits:    0.9 %  sodium chloride infusion, , Intravenous, Once, Luretta Everly, Rudell Cobb, MD   CARBOplatin (PARAPLATIN) 650 mg in sodium chloride 0.9 % 250 mL chemo infusion, 650 mg, Intravenous, Once, Bobbiejo Ishikawa, Rudell Cobb, MD   dexamethasone (DECADRON) 10 mg in sodium chloride 0.9 % 50 mL IVPB, 10 mg, Intravenous, Once, Lenay Lovejoy, Rudell Cobb, MD   etoposide (VEPESID) 190 mg in sodium chloride 0.9 % 500 mL chemo infusion, 90 mg/m2 (Treatment Plan Recorded), Intravenous, Once, Emmalyn Hinson, Rudell Cobb, MD   fosaprepitant (EMEND) 150 mg in sodium chloride 0.9 % 145 mL IVPB, 150 mg, Intravenous, Once, Petrina Melby, Rudell Cobb, MD   heparin lock flush 100 unit/mL, 500 Units, Intracatheter, Once PRN, [name], MD   palonosetron (ALOXI) injection 0.25 mg, 0.25 mg, Intravenous, Once, Kaidyn Javid, Rudell Cobb, MD   sodium chloride flush (NS) 0.9 % injection 10 mL, 10 mL, Intracatheter, PRN, [name], MD  Allergies:  Allergies  Allergen Reactions   Rofecoxib Swelling    Past Medical History, Surgical history, Social history, and Family History were reviewed and updated.  Review of  Systems: Review of Systems  Constitutional: Negative.   HENT:  Negative.    Eyes: Negative.   Respiratory: Negative.    Cardiovascular:  Positive for leg swelling.  Gastrointestinal: Negative.   Endocrine: Negative.   Genitourinary: Negative.    Skin:  Positive for itching.  Neurological: Negative.   Hematological: Negative.   Psychiatric/Behavioral: Negative.     Physical Exam:  weight is 203 lb (92.1 kg). Her oral temperature is 97.9 F (36.6 C). Her blood pressure is 124/40 (abnormal) and her pulse is 89. Her respiration is 24 (abnormal) and oxygen saturation is 92%.   Wt Readings from Last 3 Encounters:  11/26/21 203 lb (92.1 kg)  11/21/21  216 lb 14.9 oz (98.4 kg)  11/02/21 204 lb (92.5 kg)    Physical Exam Vitals reviewed.  HENT:     Head: Normocephalic and atraumatic.  Eyes:     Pupils: Pupils are equal, round, and reactive to light.  Cardiovascular:     Rate and Rhythm: Normal rate and regular rhythm.     Heart sounds: Normal heart sounds.  Pulmonary:     Effort: Pulmonary effort is normal.     Breath sounds: Normal breath sounds.     Comments: Lungs sound clear bilaterally.  She may have some slight decrease at the left base.  She has good air movement bilaterally.  There is no wheezing. Abdominal:     General: Bowel sounds are normal.     Palpations: Abdomen is soft.     Comments: Abdominal exam shows a soft abdomen.  She is somewhat obese.  She has no fluid wave.  There is no guarding or rebound tenderness.  I really cannot palpate her liver edge.  Musculoskeletal:        General: No tenderness or deformity. Normal range of motion.     Cervical back: Normal range of motion.     Comments: Extremities shows swelling in the legs.  This in the lower legs.  Her thighs have gotten a whole lot better.  There is very little swelling in the thighs.  She has wrapping on the lower legs.  Lymphadenopathy:     Cervical: No cervical adenopathy.  Skin:    General: Skin is  warm and dry.     Findings: No erythema or rash.  Neurological:     Mental Status: She is alert and oriented to person, place, and time.  Psychiatric:        Behavior: Behavior normal.        Thought Content: Thought content normal.        Judgment: Judgment normal.     Lab Results  Component Value Date   WBC 11.6 (H) 11/26/2021   HGB 10.1 (L) 11/26/2021   HCT 31.7 (L) 11/26/2021   MCV 94.9 11/26/2021   PLT 334 11/26/2021     Chemistry      Component Value Date/Time   NA 138 11/26/2021 1041   K 3.0 (L) 11/26/2021 1041   CL 94 (L) 11/26/2021 1041   CO2 33 (H) 11/26/2021 1041   BUN 18 11/26/2021 1041   CREATININE 0.76 11/26/2021 1041      Component Value Date/Time   CALCIUM 9.2 11/26/2021 1041   ALKPHOS 198 (H) 11/26/2021 1041   AST 52 (H) 11/26/2021 1041   ALT 26 11/26/2021 1041   BILITOT 1.0 11/26/2021 1041       Impression and Plan: Ms. Wien is a very charming 61 year old white female.  She presented with impending liver and kidney failure because of marked tumor progression.  She subsequently treated in the hospital.  She did incredibly well.  Her liver and renal function normalized.  She, I would think, has had at least 75% reduction in her cancer.  I think the LDH is certainly an indication as to how well she has responded.  We can certainly give her higher doses now.  We still cannot give her full dose but yet we are certainly getting up to where we can give her more substantial dosing.  We will also add the immunotherapy and to her protocol now.  After this cycle of treatment, we will plan for a follow-up CT scan.  We will also plan for an MRI of the brain.  I am just happy that she is responded.  I really thought that she was not good to make it when I first saw her given the rapid progression of her disease, the tumor lysis, the renal and hepatic insufficiency that was bordering on failure.  Everything came together so quickly and we were able to get her  treated in 2 days and this really I think helped her out.  She got incredible care from the staff up on 6 E. at [facility].  We will plan to get her back to see [name] in another couple weeks so we can see how her blood counts look.   [name], MD 3/13/202312:22 PM

## 2021-11-26 NOTE — Patient Instructions (Signed)

## 2021-11-26 NOTE — Progress Notes (Unsigned)
Initial RN Navigator Patient Visit  Name: SHAUNTEL PREST Diagnosis:Metastatic Small Cell Lung  Met with patient prior to their visit with MD. Hanley Seamen patient "Your Patient Navigator" handout which explains my role, areas in which I am able to help, and all the contact information for myself and the office. Also gave patient MD and Navigator business card. Reviewed with patient the general overview of expected course after initial diagnosis and time frame for all steps to be completed.  New patient packet given to patient which includes: orientation to office and staff; campus directory; education on My Chart and Advance Directives; and patient centered education on [place].   Patient is with her daughter-in-law. She has a good support system. She is retired. She lives in [place], so if possible, we will schedule outside procedures at [facility].   Discussed genetics with patient. She would like this done. Blood will be drawn and shipped today. Referral to genetics placed.   Patient asks about disability. Recommended that the patient go to their county DSS. Address provided. Also notified her that if she wanted help, I could place a referral to the [facility]. She will let me know.   Patient completed visit with Dr. Marin Olp.   Patient ready to start cycle number two after receiving her first cycle in the hospital. She is also here Tuesday and Wednesday of this week.   Patient needs CT and MRI scheduled. Both scheduled at [facility]. Patient is aware of date, time and location for each scan. Prep reviewed and Contrast given for CT and MRI. Radiology information sheet and contrast instruction sheet also give to patient for education reinforcement.   Patient understands all follow up procedures and expectations. They have my number to reach out for any further clarification or additional needs.

## 2021-11-27 ENCOUNTER — Encounter: Payer: Self-pay | Admitting: Hematology & Oncology

## 2021-11-27 ENCOUNTER — Encounter: Payer: Self-pay | Admitting: *Deleted

## 2021-11-27 ENCOUNTER — Inpatient Hospital Stay: Payer: BC Managed Care – PPO

## 2021-11-27 VITALS — BP 129/44 | HR 87 | Temp 97.8°F | Resp 20

## 2021-11-27 DIAGNOSIS — C3490 Malignant neoplasm of unspecified part of unspecified bronchus or lung: Secondary | ICD-10-CM

## 2021-11-27 DIAGNOSIS — Z5111 Encounter for antineoplastic chemotherapy: Secondary | ICD-10-CM | POA: Diagnosis not present

## 2021-11-27 MED ORDER — SODIUM CHLORIDE 0.9 % IV SOLN
10.0000 mg | Freq: Once | INTRAVENOUS | Status: AC
  Administered 2021-11-27: 10 mg via INTRAVENOUS
  Filled 2021-11-27: qty 10

## 2021-11-27 MED ORDER — SODIUM CHLORIDE 0.9 % IV SOLN
90.0000 mg/m2 | Freq: Once | INTRAVENOUS | Status: AC
  Administered 2021-11-27: 190 mg via INTRAVENOUS
  Filled 2021-11-27: qty 9.5

## 2021-11-27 MED ORDER — SODIUM CHLORIDE 0.9 % IV SOLN: Freq: Once | INTRAVENOUS | Status: AC

## 2021-11-27 MED ORDER — HEPARIN SOD (PORK) LOCK FLUSH 100 UNIT/ML IV SOLN
500.0000 [IU] | Freq: Once | INTRAVENOUS | Status: AC | PRN
  Administered 2021-11-27: 500 [IU]

## 2021-11-27 MED ORDER — SODIUM CHLORIDE 0.9% FLUSH
10.0000 mL | INTRAVENOUS | Status: DC | PRN
  Administered 2021-11-27: 10 mL

## 2021-11-28 ENCOUNTER — Other Ambulatory Visit: Payer: Self-pay

## 2021-11-28 ENCOUNTER — Inpatient Hospital Stay: Payer: BC Managed Care – PPO

## 2021-11-28 VITALS — BP 101/38 | HR 71 | Temp 97.7°F | Resp 18

## 2021-11-28 DIAGNOSIS — Z5111 Encounter for antineoplastic chemotherapy: Secondary | ICD-10-CM | POA: Diagnosis not present

## 2021-11-28 DIAGNOSIS — C3490 Malignant neoplasm of unspecified part of unspecified bronchus or lung: Secondary | ICD-10-CM

## 2021-11-28 DIAGNOSIS — Z95828 Presence of other vascular implants and grafts: Secondary | ICD-10-CM

## 2021-11-28 MED ORDER — SODIUM CHLORIDE 0.9% FLUSH: 10.0000 mL | Freq: Once | INTRAVENOUS | Status: DC

## 2021-11-28 MED ORDER — SODIUM CHLORIDE 0.9 % IV SOLN
10.0000 mg | Freq: Once | INTRAVENOUS | Status: AC
  Administered 2021-11-28: 10 mg via INTRAVENOUS
  Filled 2021-11-28: qty 10

## 2021-11-28 MED ORDER — SODIUM CHLORIDE 0.9 % IV SOLN
90.0000 mg/m2 | Freq: Once | INTRAVENOUS | Status: AC
  Administered 2021-11-28: 190 mg via INTRAVENOUS
  Filled 2021-11-28: qty 9.5

## 2021-11-28 MED ORDER — HEPARIN SOD (PORK) LOCK FLUSH 100 UNIT/ML IV SOLN: 250.0000 [IU] | Freq: Once | INTRAVENOUS | Status: DC | PRN

## 2021-11-28 MED ORDER — HEPARIN SOD (PORK) LOCK FLUSH 100 UNIT/ML IV SOLN
500.0000 [IU] | Freq: Once | INTRAVENOUS | Status: AC | PRN
  Administered 2021-11-28: 500 [IU]

## 2021-11-28 MED ORDER — HOT PACK MISC ONCOLOGY: 1.0000 | Freq: Once | Status: DC | PRN

## 2021-11-28 MED ORDER — SODIUM CHLORIDE 0.9 % IV SOLN: Freq: Once | INTRAVENOUS | Status: AC

## 2021-11-28 MED ORDER — SODIUM CHLORIDE 0.9% FLUSH
10.0000 mL | INTRAVENOUS | Status: DC | PRN
  Administered 2021-11-28 (×2): 10 mL

## 2021-11-28 NOTE — Patient Instructions (Signed)
[facility]  Discharge Instructions: ?Thank you for choosing [facility] to provide your oncology and hematology care.  ? ?If you have a lab appointment with the [facility], please go directly to the [facility] and check in at the registration area. ? ?Wear comfortable clothing and clothing appropriate for easy access to any Portacath or PICC line.  ? ?We strive to give you quality time with your provider. You may need to reschedule your appointment if you arrive late (15 or more minutes).  Arriving late affects you and other patients whose appointments are after yours.  Also, if you miss three or more appointments without notifying the office, you may be dismissed from the clinic at the provider?s discretion.    ?  ?For prescription refill requests, have your pharmacy contact our office and allow 72 hours for refills to be completed.   ? ?Today you received the following chemotherapy and/or immunotherapy agents Vepesid.    ?  ?To help prevent nausea and vomiting after your treatment, we encourage you to take your nausea medication as directed. ? ?BELOW ARE SYMPTOMS THAT SHOULD BE REPORTED IMMEDIATELY: ?*FEVER GREATER THAN 100.4 F (38 ?C) OR HIGHER ?*CHILLS OR SWEATING ?*NAUSEA AND VOMITING THAT IS NOT CONTROLLED WITH YOUR NAUSEA MEDICATION ?*UNUSUAL SHORTNESS OF BREATH ?*UNUSUAL BRUISING OR BLEEDING ?*URINARY PROBLEMS (pain or burning when urinating, or frequent urination) ?*BOWEL PROBLEMS (unusual diarrhea, constipation, pain near the anus) ?TENDERNESS IN MOUTH AND THROAT WITH OR WITHOUT PRESENCE OF ULCERS (sore throat, sores in mouth, or a toothache) ?UNUSUAL RASH, SWELLING OR PAIN  ?UNUSUAL VAGINAL DISCHARGE OR ITCHING  ? ?Items with * indicate a potential emergency and should be followed up as soon as possible or go to the Emergency Department if any problems should occur. ? ?Please show the CHEMOTHERAPY ALERT CARD or IMMUNOTHERAPY ALERT CARD at check-in to the  Emergency Department and triage nurse. ?Should you have questions after your visit or need to cancel or reschedule your appointment, please contact [facility]  [phone] and follow the prompts.  Office hours are 8:00 a.m. to 4:30 p.m. Monday - Friday. Please note that voicemails left after 4:00 p.m. may not be returned until the following business day.  We are closed weekends and major holidays. You have access to a nurse at all times for urgent questions. Please call the main number to the clinic [phone] and follow the prompts. ? ?For any non-urgent questions, you may also contact your provider using MyChart. We now offer e-Visits for anyone 26 and older to request care online for non-urgent symptoms. For details visit [email or web address]. ?  ?Also download the MyChart app! Go to the app store, search "MyChart", open the app, select [facility], and log in with your MyChart username and password. ? ?Due to Covid, a mask is required upon entering the hospital/clinic. If you do not have a mask, one will be given to you upon arrival. For doctor visits, patients may have 1 support person aged 58 or older with them. For treatment visits, patients cannot have anyone with them due to current Covid guidelines and our immunocompromised population.  ?

## 2021-11-30 ENCOUNTER — Other Ambulatory Visit: Payer: Self-pay

## 2021-11-30 ENCOUNTER — Inpatient Hospital Stay: Payer: BC Managed Care – PPO

## 2021-11-30 DIAGNOSIS — Z5111 Encounter for antineoplastic chemotherapy: Secondary | ICD-10-CM | POA: Diagnosis not present

## 2021-11-30 DIAGNOSIS — C3490 Malignant neoplasm of unspecified part of unspecified bronchus or lung: Secondary | ICD-10-CM

## 2021-11-30 MED ORDER — PEGFILGRASTIM-CBQV 6 MG/0.6ML ~~LOC~~ SOSY
6.0000 mg | PREFILLED_SYRINGE | Freq: Once | SUBCUTANEOUS | Status: AC
  Administered 2021-11-30: 6 mg via SUBCUTANEOUS
  Filled 2021-11-30: qty 0.6

## 2021-12-06 ENCOUNTER — Other Ambulatory Visit: Payer: Self-pay

## 2021-12-06 ENCOUNTER — Encounter: Payer: BC Managed Care – PPO | Attending: Physician Assistant | Admitting: Physician Assistant

## 2021-12-06 DIAGNOSIS — I2510 Atherosclerotic heart disease of native coronary artery without angina pectoris: Secondary | ICD-10-CM | POA: Insufficient documentation

## 2021-12-06 DIAGNOSIS — I110 Hypertensive heart disease with heart failure: Secondary | ICD-10-CM | POA: Diagnosis not present

## 2021-12-06 DIAGNOSIS — C7A090 Malignant carcinoid tumor of the bronchus and lung: Secondary | ICD-10-CM | POA: Insufficient documentation

## 2021-12-06 DIAGNOSIS — E11628 Type 2 diabetes mellitus with other skin complications: Secondary | ICD-10-CM | POA: Diagnosis present

## 2021-12-06 DIAGNOSIS — I509 Heart failure, unspecified: Secondary | ICD-10-CM | POA: Insufficient documentation

## 2021-12-06 DIAGNOSIS — I872 Venous insufficiency (chronic) (peripheral): Secondary | ICD-10-CM | POA: Diagnosis not present

## 2021-12-06 DIAGNOSIS — Z87891 Personal history of nicotine dependence: Secondary | ICD-10-CM | POA: Diagnosis not present

## 2021-12-06 NOTE — Progress Notes (Signed)
[name], [name] ([id number]) ?Visit Report for 12/06/2021 ?Abuse Risk Screen Details ?Patient Name: Pamela [name], Pamela [name]. ?Date of Service: 12/06/2021 10:00 AM ?Medical Record Number: [id number] ?Patient Account Number: [id number] ?Date of Birth/Sex: [birth year] (61 y.o. F) ?Treating RN: Donnamarie Poag ?Primary Care Jamone Garrido: Harold Barban Other Clinician: ?Referring Lillian Tigges: Burney Gauze ?Treating Clydell Alberts/Extender: Jeri Cos ?Weeks in Treatment: 0 ?Abuse Risk Screen Items ?Answer ?ABUSE RISK SCREEN: ?Has anyone close to you tried to hurt or harm you recentlyo No ?Do you feel uncomfortable with anyone in your familyo No ?Has anyone forced you do things that you didnot want to doo No ?Electronic Signature(s) ?Signed: 12/06/2021 4:16:55 PM By: Donnamarie Poag ?Entered ByDonnamarie Poag on 12/06/2021 10:26:54 ?[name], [name] ([id number]) ?-------------------------------------------------------------------------------- ?Activities of Daily Living Details ?Patient Name: Pamela [name], Pamela [name]. ?Date of Service: 12/06/2021 10:00 AM ?Medical Record Number: [id number] ?Patient Account Number: [id number] ?Date of Birth/Sex: 01-01-1961 (61 y.o. F) ?Treating RN: Donnamarie Poag ?Primary Care Yoandri Congrove: Harold Barban Other Clinician: ?Referring Desirai Traxler: Burney Gauze ?Treating Belmont Valli/Extender: Jeri Cos ?Weeks in Treatment: 0 ?Activities of Daily Living Items ?Answer ?Activities of Daily Living (Please select one for each item) ?[place] ?Take Medications Completely Able ?Use Telephone Completely Able ?Care for Appearance Completely Able ?Use Toilet Completely Able ?Bath / Shower Completely Able ?Dress Self Completely Able ?Feed Self Completely Able ?Walk Completely Able ?Get In / Out Bed Completely Able ?Housework Completely Able ?Prepare Meals Completely Able ?Handle Money Completely Able ?Shop for Self Completely Able ?Electronic Signature(s) ?Signed: 12/06/2021 4:16:55 PM By: Donnamarie Poag ?Entered ByDonnamarie Poag on  12/06/2021 10:24:49 ?[name], [name] ([id number]) ?-------------------------------------------------------------------------------- ?Education Screening Details ?Patient Name: Pamela [name], Pamela [name]. ?Date of Service: 12/06/2021 10:00 AM ?Medical Record Number: [id number] ?Patient Account Number: [id number] ?Date of Birth/Sex: [birth year] (61 y.o. F) ?Treating RN: Donnamarie Poag ?Primary Care Undrea Shipes: Harold Barban Other Clinician: ?Referring Ritu Gagliardo: Burney Gauze ?Treating Reubin Bushnell/Extender: Jeri Cos ?Weeks in Treatment: 0 ?Primary Learner Assessed: Patient ?Learning Preferences/Education Level/Primary Language ?Learning Preference: Explanation ?Highest Education Level: High School ?Preferred Language: English ?Cognitive Barrier ?Language Barrier: No ?Translator Needed: No ?Memory Deficit: No ?Emotional Barrier: No ?Cultural/Religious Beliefs Affecting Medical Care: No ?Physical Barrier ?Impaired Vision: No ?Impaired Hearing: No ?Decreased Hand dexterity: No ?Knowledge/Comprehension ?Knowledge Level: High ?Comprehension Level: High ?Ability to understand written instructions: High ?Ability to understand verbal instructions: High ?Motivation ?Anxiety Level: Calm ?Cooperation: Cooperative ?Education Importance: Acknowledges Need ?Interest in Health Problems: Asks Questions ?Perception: Coherent ?Willingness to Engage in Self-Management ?High ?Activities: ?Readiness to Engage in Self-Management ?High ?Activities: ?Electronic Signature(s) ?Signed: 12/06/2021 4:16:55 PM By: Donnamarie Poag ?Entered ByDonnamarie Poag on 12/06/2021 10:26:42 ?[name], [name] ([id number]) ?-------------------------------------------------------------------------------- ?Fall Risk Assessment Details ?Patient Name: Pamela [name], Pamela [name]. ?Date of Service: 12/06/2021 10:00 AM ?Medical Record Number: [id number] ?Patient Account Number: [id number] ?Date of Birth/Sex: 30-Jul-1961 (61 y.o. F) ?Treating RN: Donnamarie Poag ?Primary Care Danya Spearman: Harold Barban Other  Clinician: ?Referring Jushua Waltman: Burney Gauze ?Treating Kou Gucciardo/Extender: Jeri Cos ?Weeks in Treatment: 0 ?Fall Risk Assessment Items ?Have you had 2 or more falls in the last 12 monthso 0 No ?Have you had any fall that resulted in injury in the last 12 monthso 0 No ?FALLS RISK SCREEN ?History of falling - immediate or within 3 months 0 No ?Secondary diagnosis (Do you have 2 or more medical diagnoseso) 15 Yes ?Ambulatory aid ?None/bed rest/wheelchair/nurse 0 Yes ?Crutches/cane/walker 0 No ?Furniture 0 No ?Intravenous therapy Access/Saline/Heparin Lock 0 No ?Gait/Transferring ?Normal/ bed rest/ wheelchair 0 Yes ?Weak (short steps with or without shuffle, stooped  but able to lift head while walking, may ?0 No ?seek support from furniture) ?Impaired (short steps with shuffle, may have difficulty arising from chair, head down, impaired ?0 No ?balance) ?Mental Status ?Oriented to own ability 0 Yes ?Electronic Signature(s) ?Signed: 12/06/2021 4:16:55 PM By: Donnamarie Poag ?Entered ByDonnamarie Poag on 12/06/2021 10:26:17 ?[name], [name] ([id number]) ?-------------------------------------------------------------------------------- ?Foot Assessment Details ?Patient Name: Pamela [name], Pamela [name]. ?Date of Service: 12/06/2021 10:00 AM ?Medical Record Number: [id number] ?Patient Account Number: [id number] ?Date of Birth/Sex: Apr 29, 1961 (61 y.o. F) ?Treating RN: Donnamarie Poag ?Primary Care Evaline Waltman: Harold Barban Other Clinician: ?Referring Tamario Heal: Burney Gauze ?Treating Garris Melhorn/Extender: Jeri Cos ?Weeks in Treatment: 0 ?Foot Assessment Items ?Site Locations ?+ = Sensation present, - = Sensation absent, C = Callus, U = Ulcer ?R = Redness, W = Warmth, M = Maceration, PU = Pre-ulcerative lesion ?F = Fissure, S = Swelling, D = Dryness ?Assessment ?Right: Left: ?Other Deformity: No No ?Prior Foot Ulcer: No No ?Prior Amputation: No No ?Charcot Joint: No No ?Ambulatory Status: Ambulatory Without Help ?Gait: Steady ?Electronic  Signature(s) ?Signed: 12/06/2021 4:16:55 PM By: Donnamarie Poag ?Entered ByDonnamarie Poag on 12/06/2021 10:25:34 ?Bidinger, Saylor D. ([id number]) ?-------------------------------------------------------------------------------- ?Nutrition Risk Screening Details ?Patient Name: Pamela [name], Pamela [name]. ?Date of Service: 12/06/2021 10:00 AM ?Medical Record Number: [id number] ?Patient Account Number: [id number] ?Date of Birth/Sex: 05/27/1961 (61 y.o. F) ?Treating RN: Donnamarie Poag ?Primary Care Hazely Sealey: Harold Barban Other Clinician: ?Referring Cylis Ayars: Burney Gauze ?Treating Brok Stocking/Extender: Jeri Cos ?Weeks in Treatment: 0 ?Height (in): 61 ?Weight (lbs): 186 ?Body Mass Index (BMI): 35.1 ?Nutrition Risk Screening Items ?Score Screening ?NUTRITION RISK SCREEN: ?I have an illness or condition that made me change the kind and/or amount of food I eat 2 Yes ?I eat fewer than two meals per day 0 No ?I eat few fruits and vegetables, or milk products 0 No ?I have three or more drinks of beer, liquor or wine almost every day 0 No ?I have tooth or mouth problems that make it hard for me to eat 0 No ?I don't always have enough money to buy the food I need 0 No ?I eat alone most of the time 0 No ?I take three or more different prescribed or over-the-counter drugs a day 1 Yes ?Without wanting to, I have lost or gained 10 pounds in the last six months 2 Yes ?I am not always physically able to shop, cook and/or feed myself 0 No ?Nutrition Protocols ?Good Risk Protocol ?Moderate Risk Protocol 0 Provide education on nutrition ?High Risk Proctocol ?Risk Level: Moderate Risk ?Score: 5 ?Electronic Signature(s) ?Signed: 12/06/2021 4:16:55 PM By: Donnamarie Poag ?Entered ByDonnamarie Poag on 12/06/2021 10:26:00 ?

## 2021-12-06 NOTE — Progress Notes (Signed)
AARIAH, GODETTE D. ([id number]) ?Visit Report for 12/06/2021 ?Allergy List Details ?Patient Name: Pamela [name], Pamela [name]. ?Date of Service: 12/06/2021 10:00 AM ?Medical Record Number: [id number] ?Patient Account Number: [id number] ?Date of Birth/Sex: 10/03/1960 (61 y.o. F) ?Treating RN: Donnamarie Poag ?Primary Care Armine Rizzolo: Harold Barban Other Clinician: ?Referring Loretta Kluender: Burney Gauze ?Treating Reyan Helle/Extender: Jeri Cos ?Weeks in Treatment: 0 ?Allergies ?Active Allergies ?rofecoxib ?Severity: Severe ?Allergy Notes ?Electronic Signature(s) ?Signed: 12/06/2021 4:16:55 PM By: Donnamarie Poag ?Entered ByDonnamarie Poag on 12/06/2021 10:24:17 ?Topor, Tawna D. ([id number]) ?-------------------------------------------------------------------------------- ?Arrival Information Details ?Patient Name: Pamela [name]. ?Date of Service: 12/06/2021 10:00 AM ?Medical Record Number: [id number] ?Patient Account Number: [id number] ?Date of Birth/Sex: 10-27-1960 (61 y.o. F) ?Treating RN: Donnamarie Poag ?Primary Care Loza Prell: Harold Barban Other Clinician: ?Referring Emre Stock: Burney Gauze ?Treating Compton Brigance/Extender: Jeri Cos ?Weeks in Treatment: 0 ?Visit Information ?Patient Arrived: Ambulatory ?Arrival Time: 10:15 ?Accompanied By: daughter in law ?Transfer Assistance: None ?Patient Identification Verified: Yes ?Secondary Verification Process Completed: Yes ?Patient Requires Transmission-Based Precautions: No ?Patient Has Alerts: Yes ?Patient Alerts: Diabetic ?Electronic Signature(s) ?Signed: 12/06/2021 4:16:55 PM By: Donnamarie Poag ?Entered ByDonnamarie Poag on 12/06/2021 10:20:19 ?Werst, Shoshannah D. ([id number]) ?-------------------------------------------------------------------------------- ?Clinic Level of Care Assessment Details ?Patient Name: Pamela [name]. ?Date of Service: 12/06/2021 10:00 AM ?Medical Record Number: [id number] ?Patient Account Number: [id number] ?Date of Birth/Sex: 07/06/1961 (61 y.o. F) ?Treating RN: Donnamarie Poag ?Primary Care Demetria Iwai:  Harold Barban Other Clinician: ?Referring Macdonald Rigor: Burney Gauze ?Treating Zaylan Kissoon/Extender: Jeri Cos ?Weeks in Treatment: 0 ?Clinic Level of Care Assessment Items ?TOOL 2 Quantity Score ?[]  - Use when only an EandM is performed on the INITIAL visit 0 ?ASSESSMENTS - Nursing Assessment / Reassessment ?X - General Physical Exam (combine w/ comprehensive assessment (listed just below) when performed on new ?1 20 ?pt. evals) ?X- 1 25 ?Comprehensive Assessment (HX, ROS, Risk Assessments, Wounds Hx, etc.) ?ASSESSMENTS - Wound and Skin Assessment / Reassessment ?[]  - Simple Wound Assessment / Reassessment - one wound 0 ?[]  - 0 ?Complex Wound Assessment / Reassessment - multiple wounds ?X- 1 10 ?Dermatologic / Skin Assessment (not related to wound area) ?ASSESSMENTS - Ostomy and/or Continence Assessment and Care ?[]  - Incontinence Assessment and Management 0 ?[]  - 0 ?Ostomy Care Assessment and Management (repouching, etc.) ?PROCESS - Coordination of Care ?X - Simple Patient / Family Education for ongoing care 1 15 ?[]  - 0 ?Complex (extensive) Patient / Family Education for ongoing care ?[]  - 0 ?Staff obtains Consents, Records, Test Results / Process Orders ?[]  - 0 ?Staff telephones [place], Nursing Homes / Clarify orders / etc ?[]  - 0 ?Routine Transfer to another Facility (non-emergent condition) ?[]  - 0 ?Routine Hospital Admission (non-emergent condition) ?X- 1 15 ?New Admissions / Biomedical engineer / Ordering NPWT, Apligraf, etc. ?[]  - 0 ?Emergency Hospital Admission (emergent condition) ?X- 1 10 ?Simple Discharge Coordination ?[]  - 0 ?Complex (extensive) Discharge Coordination ?PROCESS - Special Needs ?[]  - Pediatric / Minor Patient Management 0 ?[]  - 0 ?Isolation Patient Management ?[]  - 0 ?Hearing / Language / Visual special needs ?[]  - 0 ?Assessment of Community assistance (transportation, D/C planning, etc.) ?[]  - 0 ?Additional assistance / Altered mentation ?[]  - 0 ?Support Surface(s) Assessment (bed,  cushion, seat, etc.) ?INTERVENTIONS - Wound Cleansing / Measurement ?X - Wound Imaging (photographs - any number of wounds) 1 5 ?[]  - 0 ?Wound Tracing (instead of photographs) ?[]  - 0 ?Simple Wound Measurement - one wound ?[]  - 0 ?Complex Wound Measurement - multiple wounds ?MICHIE, MOLNAR D. ([id number]) ?[]  - 0 ?Simple  Wound Cleansing - one wound ?[]  - 0 ?Complex Wound Cleansing - multiple wounds ?INTERVENTIONS - Wound Dressings ?[]  - Small Wound Dressing one or multiple wounds 0 ?[]  - 0 ?Medium Wound Dressing one or multiple wounds ?[]  - 0 ?Large Wound Dressing one or multiple wounds ?[]  - 0 ?Application of Medications - injection ?INTERVENTIONS - Miscellaneous ?[]  - External ear exam 0 ?[]  - 0 ?Specimen Collection (cultures, biopsies, blood, body fluids, etc.) ?[]  - 0 ?Specimen(s) / Culture(s) sent or taken to Lab for analysis ?[]  - 0 ?Patient Transfer (multiple staff / Civil Service fast streamer / Similar devices) ?[]  - 0 ?Simple Staple / Suture removal (25 or less) ?[]  - 0 ?Complex Staple / Suture removal (26 or more) ?[]  - 0 ?Hypo / Hyperglycemic Management (close monitor of Blood Glucose) ?X- 1 15 ?Ankle / Brachial Index (ABI) - do not check if billed separately ?Has the patient been seen at the hospital within the last three years: Yes ?Total Score: 115 ?Level Of Care: New/Established - Level ?3 ?Electronic Signature(s) ?Signed: 12/06/2021 4:16:55 PM By: Donnamarie Poag ?Entered ByDonnamarie Poag on 12/06/2021 11:10:46 ?Faraj, Tyria D. ([id number]) ?-------------------------------------------------------------------------------- ?Encounter Discharge Information Details ?Patient Name: Pamela [name]. ?Date of Service: 12/06/2021 10:00 AM ?Medical Record Number: [id number] ?Patient Account Number: [id number] ?Date of Birth/Sex: Apr 16, 1961 (61 y.o. F) ?Treating RN: Donnamarie Poag ?Primary Care Danell Verno: Harold Barban Other Clinician: ?Referring Christell Steinmiller: Burney Gauze ?Treating Daunte Oestreich/Extender: Jeri Cos ?Weeks in Treatment: 0 ?Encounter  Discharge Information Items ?Discharge Condition: Stable ?Ambulatory Status: Ambulatory ?Discharge Destination: Home ?Transportation: Private Auto ?Accompanied By: daughter in law ?Schedule Follow-up Appointment: No ?Clinical Summary of Care: ?Electronic Signature(s) ?Signed: 12/06/2021 4:16:55 PM By: Donnamarie Poag ?Entered ByDonnamarie Poag on 12/06/2021 11:13:10 ?Kaatz, Jacquline D. ([id number]) ?-------------------------------------------------------------------------------- ?Lower Extremity Assessment Details ?Patient Name: KENADI, MILTNER. ?Date of Service: 12/06/2021 10:00 AM ?Medical Record Number: [id number] ?Patient Account Number: [id number] ?Date of Birth/Sex: 05-17-1961 (61 y.o. F) ?Treating RN: Donnamarie Poag ?Primary Care Nickalaus Crooke: Harold Barban Other Clinician: ?Referring Dorothia Passmore: Burney Gauze ?Treating Tyarra Nolton/Extender: Jeri Cos ?Weeks in Treatment: 0 ?Edema Assessment ?Assessed: [Left: Yes] [Right: Yes] ?[Left: Edema] [Right: :] ?Calf ?Left: Right: ?Point of Measurement: 27 cm From Medial Instep 39 cm 41 cm ?Ankle ?Left: Right: ?Point of Measurement: 8 cm From Medial Instep 22.5 cm 22.5 cm ?Knee To Floor ?Left: Right: ?From Medial Instep 40 cm 40 cm ?Vascular Assessment ?Pulses: ?Dorsalis Pedis ?Palpable: [Left:Yes] [Right:Yes] ?Blood Pressure: ?Brachial: [Left:110] ?Ankle: ?[Left:Dorsalis Pedis: 180 1.64] ?Electronic Signature(s) ?Signed: 12/06/2021 4:16:55 PM By: Donnamarie Poag ?Entered ByDonnamarie Poag on 12/06/2021 10:39:47 ?Montagna, Takela D. ([id number]) ?-------------------------------------------------------------------------------- ?Multi Wound Chart Details ?Patient Name: EMERSEN, MASCARI. ?Date of Service: 12/06/2021 10:00 AM ?Medical Record Number: [id number] ?Patient Account Number: [id number] ?Date of Birth/Sex: [birth year] (61 y.o. F) ?Treating RN: Donnamarie Poag ?Primary Care Pollyann Roa: Harold Barban Other Clinician: ?Referring Nadira Single: Burney Gauze ?Treating Lillybeth Tal/Extender: Jeri Cos ?Weeks in  Treatment: 0 ?Vital Signs ?Height(in): 61 ?Pulse(bpm): 89 ?Weight(lbs): 186 ?Blood Pressure(mmHg): 133/45 ?Body Mass Index(BMI): 35.1 ?Temperature(??F): 97.5 ?Respiratory Rate(breaths/min): 16 ?Wound Assess

## 2021-12-07 ENCOUNTER — Inpatient Hospital Stay: Payer: BC Managed Care – PPO

## 2021-12-07 ENCOUNTER — Inpatient Hospital Stay (HOSPITAL_BASED_OUTPATIENT_CLINIC_OR_DEPARTMENT_OTHER): Payer: BC Managed Care – PPO | Admitting: Hematology & Oncology

## 2021-12-07 ENCOUNTER — Encounter: Payer: Self-pay | Admitting: *Deleted

## 2021-12-07 ENCOUNTER — Encounter: Payer: Self-pay | Admitting: Hematology & Oncology

## 2021-12-07 ENCOUNTER — Telehealth: Payer: Self-pay | Admitting: *Deleted

## 2021-12-07 VITALS — BP 102/50 | HR 71 | Temp 97.7°F | Resp 19 | Wt 186.0 lb

## 2021-12-07 DIAGNOSIS — C3490 Malignant neoplasm of unspecified part of unspecified bronchus or lung: Secondary | ICD-10-CM | POA: Diagnosis not present

## 2021-12-07 DIAGNOSIS — Z5111 Encounter for antineoplastic chemotherapy: Secondary | ICD-10-CM | POA: Diagnosis not present

## 2021-12-07 LAB — CMP (CANCER CENTER ONLY)
ALT: 33 U/L (ref 0–44)
AST: 36 U/L (ref 15–41)
Albumin: 4.2 g/dL (ref 3.5–5.0)
Alkaline Phosphatase: 198 U/L — ABNORMAL HIGH (ref 38–126)
Anion gap: 11 (ref 5–15)
BUN: 11 mg/dL (ref 8–23)
CO2: 34 mmol/L — ABNORMAL HIGH (ref 22–32)
Calcium: 8.9 mg/dL (ref 8.9–10.3)
Chloride: 91 mmol/L — ABNORMAL LOW (ref 98–111)
Creatinine: 0.75 mg/dL (ref 0.44–1.00)
GFR, Estimated: >60 mL/min (ref >60)
Glucose, Bld: 155 mg/dL — ABNORMAL HIGH (ref 70–99)
Potassium: 2.6 mmol/L — CL (ref 3.5–5.1)
Sodium: 136 mmol/L (ref 135–145)
Total Bilirubin: 0.9 mg/dL (ref 0.3–1.2)
Total Protein: 7 g/dL (ref 6.5–8.1)

## 2021-12-07 LAB — CBC WITH DIFFERENTIAL (CANCER CENTER ONLY)
Abs Immature Granulocytes: 0.02 10*3/uL (ref 0.00–0.07)
Basophils Absolute: 0.1 10*3/uL (ref 0.0–0.1)
Basophils Relative: 3 %
Eosinophils Absolute: 0 10*3/uL (ref 0.0–0.5)
Eosinophils Relative: 0 %
HCT: 25.7 % — ABNORMAL LOW (ref 36.0–46.0)
Hemoglobin: 8.6 g/dL — ABNORMAL LOW (ref 12.0–15.0)
Immature Granulocytes: 1 %
Lymphocytes Relative: 33 %
Lymphs Abs: 0.9 10*3/uL (ref 0.7–4.0)
MCH: 30.3 pg (ref 26.0–34.0)
MCHC: 33.5 g/dL (ref 30.0–36.0)
MCV: 90.5 fL (ref 80.0–100.0)
Monocytes Absolute: 0.5 10*3/uL (ref 0.1–1.0)
Monocytes Relative: 19 %
Neutro Abs: 1.2 10*3/uL — ABNORMAL LOW (ref 1.7–7.7)
Neutrophils Relative %: 44 %
Platelet Count: 46 10*3/uL — ABNORMAL LOW (ref 150–400)
RBC: 2.84 MIL/uL — ABNORMAL LOW (ref 3.87–5.11)
RDW: 14.4 % (ref 11.5–15.5)
WBC Count: 2.8 10*3/uL — ABNORMAL LOW (ref 4.0–10.5)
nRBC: 0 % (ref 0.0–0.2)

## 2021-12-07 LAB — SAMPLE TO BLOOD BANK

## 2021-12-07 LAB — LACTATE DEHYDROGENASE: LDH: 261 U/L — ABNORMAL HIGH (ref 98–192)

## 2021-12-07 LAB — PREPARE RBC (CROSSMATCH)

## 2021-12-07 LAB — URIC ACID: Uric Acid, Serum: 5.7 mg/dL (ref 2.5–7.1)

## 2021-12-07 MED ORDER — HEPARIN SOD (PORK) LOCK FLUSH 100 UNIT/ML IV SOLN
500.0000 [IU] | Freq: Once | INTRAVENOUS | Status: AC
  Administered 2021-12-07: 500 [IU] via INTRAVENOUS

## 2021-12-07 MED ORDER — CIPROFLOXACIN HCL 500 MG PO TABS: 500.0000 mg | ORAL_TABLET | Freq: Every day | ORAL | 4 refills | Status: DC

## 2021-12-07 MED ORDER — POTASSIUM CHLORIDE CRYS ER 20 MEQ PO TBCR: 40.0000 meq | EXTENDED_RELEASE_TABLET | Freq: Two times a day (BID) | ORAL | 2 refills | Status: DC

## 2021-12-07 MED ORDER — SODIUM CHLORIDE 0.9% FLUSH
10.0000 mL | Freq: Once | INTRAVENOUS | Status: AC
  Administered 2021-12-07: 10 mL

## 2021-12-07 NOTE — Telephone Encounter (Signed)
Dr. Marin Olp notified of potassium-2.6.  No new orders received at this time.  ?

## 2021-12-07 NOTE — Patient Instructions (Signed)

## 2021-12-07 NOTE — Progress Notes (Signed)
?Hematology and Oncology Follow Up Visit ? ?Pamela [name] ?[id number] ?Mar 20, 1961 61 y.o. ?12/07/2021 ? ? ?Principle Diagnosis:  ?Extensive stage small cell lung cancer ? ?Current Therapy:   ?Status post cycle #2 of carboplatinum/etoposide/Tecentriq -1st cycle given in the hospital on 11/05/2021 ?    ?Interim History:  [name] is back for follow-up.  She had 2 cycles of treatment.  She has responded as I expected.  Was most amazing that her legs are almost back to normal now.  In the hospital she had incredible edema and cellulitis in her legs.  She needed to have her legs wrapped because of all the fluid.  Again the legs look in so much better now.  She does not wear any wrapping any longer. ? ?She has tolerated treatment incredibly well.  We are able to add the Tecentriq with her second cycle of treatment. ? ?As expected, her labs are on the low side.  Her potassium is quite low 2.6.  We will have to give her some potassium at home.  I told her to take 2 20 mEq of potassium twice a day and then we will recheck her potassium when she comes in for a blood transfusion on Monday. ? ?Her liver functions are back to normal.  Her renal function is back to normal.  She is eating well.  She is having no nausea or vomiting.  There are no mouth sores.  There are no issues with diarrhea.  She is urinating well. ? ?I would have to say that currently, her performance status is probably ECOG 1.   ? ? ?Medications:  ?Current Outpatient Medications:  ?  ciprofloxacin (CIPRO) 500 MG tablet, Take 1 tablet (500 mg total) by mouth daily with breakfast., Disp: 15 tablet, Rfl: 4 ?  potassium chloride SA (KLOR-CON M) 20 MEQ tablet, Take 2 tablets (40 mEq total) by mouth 2 (two) times daily., Disp: 120 tablet, Rfl: 2 ?  albuterol (VENTOLIN HFA) 108 (90 Base) MCG/ACT inhaler, 2 puffs every 6 (six) hours as needed for wheezing or shortness of breath., Disp: , Rfl:  ?  dexamethasone (DECADRON) 4 MG tablet, Take 2 tablets (8 mg total) by mouth  daily. Start the day after chemotherapy for 1 day., Disp: 30 tablet, Rfl: 1 ?  esomeprazole (NEXIUM) 20 MG capsule, Take 20 mg by mouth daily at 12 noon., Disp: , Rfl:  ?  fentaNYL (DURAGESIC) 12 MCG/HR, Place 1 patch onto the skin every 3 (three) days. (Patient not taking: Reported on 11/26/2021), Disp: 5 patch, Rfl: 0 ?  furosemide (LASIX) 40 MG tablet, Take 1 tablet (40 mg total) by mouth daily., Disp: 30 tablet, Rfl: 11 ?  levothyroxine (SYNTHROID) 125 MCG tablet, Take 0.5 tablets (62.5 mcg total) by mouth daily before breakfast., Disp: 15 tablet, Rfl: 1 ?  LORazepam (ATIVAN) 0.5 MG tablet, Take 1 tablet (0.5 mg total) by mouth every 6 (six) hours as needed (Nausea or vomiting)., Disp: 30 tablet, Rfl: 0 ?  midodrine (PROAMATINE) 5 MG tablet, Take 1 tablet (5 mg total) by mouth 3 (three) times daily with meals., Disp: 90 tablet, Rfl: 0 ?  Multiple Vitamin (MULTIVITAMIN) tablet, Take 1 tablet by mouth daily., Disp: , Rfl:  ?  naloxone (NARCAN) nasal spray 4 mg/0.1 mL, 1 spray as needed (overdose). (Patient not taking: Reported on 11/26/2021), Disp: , Rfl:  ?  ondansetron (ZOFRAN) 8 MG tablet, Take 1 tablet (8 mg total) by mouth 2 (two) times daily as needed for refractory nausea /  vomiting. Start on day 3 after carboplatin chemo., Disp: 30 tablet, Rfl: 1 ?  Oxycodone HCl 10 MG TABS, Take 1 tablet (10 mg total) by mouth every 3 (three) hours as needed (breakthrough pain)., Disp: 90 tablet, Rfl: 0 ?  prochlorperazine (COMPAZINE) 10 MG tablet, Take 1 tablet (10 mg total) by mouth every 6 (six) hours as needed (Nausea or vomiting)., Disp: 30 tablet, Rfl: 1 ?  senna-docusate (SENOKOT-S) 8.6-50 MG tablet, Take 1 tablet by mouth 2 (two) times daily., Disp: , Rfl:  ?  silver sulfADIAZINE (SILVADENE) 1 % cream, Apply Silvadene to bilat legs Q day, then cover with ABD pads and apply kerlex and an ace wrap in a spiral fashion, Disp: 50 g, Rfl: 2 ?  simvastatin (ZOCOR) 40 MG tablet, Take 40 mg by mouth every evening., Disp: ,  Rfl:  ?  sitaGLIPtin-metformin (JANUMET) 50-500 MG tablet, Take 1 tablet by mouth 2 (two) times daily with a meal., Disp: , Rfl:  ? ?Allergies:  ?Allergies  ?Allergen Reactions  ? Rofecoxib Swelling  ? ? ?Past Medical History, Surgical history, Social history, and Family History were reviewed and updated. ? ?Review of Systems: ?Review of Systems  ?Constitutional: Negative.   ?HENT:  Negative.    ?Eyes: Negative.   ?Respiratory: Negative.    ?Cardiovascular:  Positive for leg swelling.  ?Gastrointestinal: Negative.   ?Endocrine: Negative.   ?Genitourinary: Negative.    ?Skin:  Positive for itching.  ?Neurological: Negative.   ?Hematological: Negative.   ?Psychiatric/Behavioral: Negative.    ? ?Physical Exam: ? weight is 186 lb (84.4 kg). Her oral temperature is 97.7 ?F (36.5 ?C). Her blood pressure is 102/50 (abnormal) and her pulse is 71. Her respiration is 19 and oxygen saturation is 98%.  ? ?Wt Readings from Last 3 Encounters:  ?12/07/21 186 lb (84.4 kg)  ?11/26/21 203 lb (92.1 kg)  ?11/21/21 216 lb 14.9 oz (98.4 kg)  ? ? ?Physical Exam ?Vitals reviewed.  ?HENT:  ?   Head: Normocephalic and atraumatic.  ?Eyes:  ?   Pupils: Pupils are equal, round, and reactive to light.  ?Cardiovascular:  ?   Rate and Rhythm: Normal rate and regular rhythm.  ?   Heart sounds: Normal heart sounds.  ?Pulmonary:  ?   Effort: Pulmonary effort is normal.  ?   Breath sounds: Normal breath sounds.  ?   Comments: Lungs sound clear bilaterally.  She may have some slight decrease at the left base.  She has good air movement bilaterally.  There is no wheezing. ?Abdominal:  ?   General: Bowel sounds are normal.  ?   Palpations: Abdomen is soft.  ?   Comments: Abdominal exam shows a soft abdomen.  She is somewhat obese.  She has no fluid wave.  There is no guarding or rebound tenderness.  I really cannot palpate her liver edge.  ?Musculoskeletal:     ?   General: No tenderness or deformity. Normal range of motion.  ?   Cervical back: Normal  range of motion.  ?   Comments: Extremities shows marked improvement in the leg edema.  She still has a little bit of erythema in the lower legs.  There is very little edema in her feet.  She has good range of motion of her legs.   ?Lymphadenopathy:  ?   Cervical: No cervical adenopathy.  ?Skin: ?   General: Skin is warm and dry.  ?   Findings: No erythema or rash.  ?Neurological:  ?  Mental Status: She is alert and oriented to person, place, and time.  ?Psychiatric:     ?   Behavior: Behavior normal.     ?   Thought Content: Thought content normal.     ?   Judgment: Judgment normal.  ? ? ? ?Lab Results  ?Component Value Date  ? WBC 2.8 (L) 12/07/2021  ? HGB 8.6 (L) 12/07/2021  ? HCT 25.7 (L) 12/07/2021  ? MCV 90.5 12/07/2021  ? PLT 46 (L) 12/07/2021  ? ?  Chemistry   ?   ?Component Value Date/Time  ? NA 136 12/07/2021 1000  ? K 2.6 (LL) 12/07/2021 1000  ? CL 91 (L) 12/07/2021 1000  ? CO2 34 (H) 12/07/2021 1000  ? BUN 11 12/07/2021 1000  ? CREATININE 0.75 12/07/2021 1000  ?    ?Component Value Date/Time  ? CALCIUM 8.9 12/07/2021 1000  ? ALKPHOS 198 (H) 12/07/2021 1000  ? AST 36 12/07/2021 1000  ? ALT 33 12/07/2021 1000  ? BILITOT 0.9 12/07/2021 1000  ?  ? ? ? ?Impression and Plan: ?Ms. Hunsberger is a very charming 61 year old white female.  She presented with impending liver and kidney failure because of marked tumor involvement and marked hyperuricemia.   She subsequently was treated in the hospital.  She had a first cycle of carboplatinum/BP-16.  She did incredibly well.  Her liver and renal function normalized.  She, I would think, has had at least 75% reduction in her cancer. ? ?She goes for her CT scan on Monday.  I think this will be incredibly impressive with respect to her response.  Again I have to believe that she has had a very marked response. ? ?She will need to have a transfusion.  I think 1 unit of blood would be helpful for her. ? ?I would also put her on some ciprofloxacin as I think her white cell  count will drop further. ? ?We will follow-up with her lab work when she comes in on Monday for her blood.  We will check her potassium.  We will check her CBC. ? ?We will still plan for third cycle of treatm

## 2021-12-07 NOTE — Progress Notes (Signed)
Patient is here for nadir check. She is doing well without any major complaints. She is slightly anemic so will come back Monday for transfusion. She reports that her legs are so much better. They appear to be healed greatly from when I saw them last.  ? ?Oncology Nurse Navigator Documentation ? ? ?  12/07/2021  ? 11:00 AM  ?Oncology Nurse Navigator Flowsheets  ?Navigator Follow Up Date: 12/10/2021  ?Navigator Follow Up Reason: Scan Review;Symptom Management  ?Navigator Location [facility]  ?Navigator Encounter Type Follow-up Appt  ?Patient Visit Type MedOnc  ?Treatment Phase Active Tx  ?Barriers/Navigation Needs Coordination of Care;Education  ?Interventions Psycho-Social Support  ?Acuity Level 3-Moderate Needs (3-4 Barriers Identified)  ?Support Groups/Services Friends and Family  ?Time Spent with Patient 15  ?  ?

## 2021-12-08 NOTE — Progress Notes (Signed)
Pamela [name], [name] ([id number]) ?Visit Report for 12/06/2021 ?Chief Complaint Document Details ?Patient Name: Pamela [name], DENUNZIO. ?Date of Service: 12/06/2021 10:00 AM ?Medical Record Number: [id number] ?Patient Account Number: [id number] ?Date of Birth/Sex: March 28, 1961 (61 y.o. F) ?Treating RN: Donnamarie Poag ?Primary Care Provider: Harold Barban Other Clinician: ?Referring Provider: Burney Gauze ?Treating Provider/Extender: Jeri Cos ?Weeks in Treatment: 0 ?Information Obtained from: Patient ?Chief Complaint ?Bilateral LE Edema ?Electronic Signature(s) ?Signed: 12/06/2021 10:54:44 AM By: Worthy Keeler PA-C ?Entered By: Worthy Keeler on 12/06/2021 10:54:43 ?Pamela [name], [name] ([id number]) ?-------------------------------------------------------------------------------- ?HPI Details ?Patient Name: Pamela [name], HAWN. ?Date of Service: 12/06/2021 10:00 AM ?Medical Record Number: [id number] ?Patient Account Number: [id number] ?Date of Birth/Sex: Jan 21, 1961 (61 y.o. F) ?Treating RN: Donnamarie Poag ?Primary Care Provider: Harold Barban Other Clinician: ?Referring Provider: Burney Gauze ?Treating Provider/Extender: Jeri Cos ?Weeks in Treatment: 0 ?History of Present Illness ?HPI Description: 12/06/2021 patient presents today for initial evaluation here in our clinic she was being sent to [name] due to wounds over the ?pretty much entirety of her lower extremities she showed me pictures this was about 3-4 maybe even 5 weeks back. Nonetheless she was in the ?hospital for 2 of those weeks and then upon discharge her and her family member have been caring for this at home. Subsequently this all started ?as a result of her having significant swelling after being taken off of her high blood pressure medication which included a diuretic and then ?subsequently having emergency chemotherapy due to lung cancer. Subsequently the patient tells me that she developed significant swelling of ?her legs while in the hospital and they decided to put on  compression socks which were placed and then had were not taken off for a full week. ?Her family member was in 1 day and she asked her to take them off and when she started to remove them there was a lot of damage to the skin ?as well as blistering as well according to what the patient tells me today. Nonetheless in the end I do feel like that this likely was just on the much ?too long really the compression sock should be on just during the day take it off at night that was discussed with the patient and her caregiver ?again today. Subsequently the patient voiced understanding she is not opposed to wearing compression socks but definitely had a poor experience ?in that regard. ?The patient does have a history of chronic venous insufficiency though she is not too swollen at this point. She also has diabetes mellitus type 2 as ?well as malignant cancer of her lungs which unfortunately she is already had an beaten colon cancer previous. She does have a lot going on at this ?point. ?Electronic Signature(s) ?Signed: 12/06/2021 1:41:33 PM By: Worthy Keeler PA-C ?Entered By: Worthy Keeler on 12/06/2021 13:41:33 ?Pamela [name], [name] ([id number]) ?-------------------------------------------------------------------------------- ?Physical Exam Details ?Patient Name: Pamela [name], RYSER. ?Date of Service: 12/06/2021 10:00 AM ?Medical Record Number: [id number] ?Patient Account Number: [id number] ?Date of Birth/Sex: 1961-07-24 (61 y.o. F) ?Treating RN: Donnamarie Poag ?Primary Care Provider: Harold Barban Other Clinician: ?Referring Provider: Burney Gauze ?Treating Provider/Extender: Jeri Cos ?Weeks in Treatment: 0 ?Constitutional ?sitting or standing blood pressure is within target range for patient.. pulse regular and within target range for patient.Marland Kitchen respirations regular, non- ?labored and within target range for patient.Marland Kitchen temperature within target range for patient.. Well-nourished and well-hydrated in no acute  distress. ?Eyes ?conjunctiva clear no eyelid edema noted. pupils equal round and reactive to light and accommodation. ?Ears,  Nose, Mouth, and Throat ?no gross abnormality of ear auricles or external auditory canals. normal hearing noted during conversation. mucus membranes moist. ?Respiratory ?normal breathing without difficulty. ?Cardiovascular ?2+ dorsalis pedis/posterior tibialis pulses. trace pitting edema of the bilateral lower extremities. ?Musculoskeletal ?normal gait and posture. no significant deformity or arthritic changes, no loss or range of motion, no clubbing. ?Psychiatric ?this patient is able to make decisions and demonstrates good insight into disease process. Alert and Oriented x 3. pleasant and cooperative. ?Notes ?Upon inspection patient's legs actually appear to be doing excellent. I do not see any signs of infection which is great news and overall I feel like ?she is actually doing quite well. In general I think that her swelling is under much better control which is also great news. In general I think that ?the treatment that they initiated did help with a reaction using Silvadene cream initially and then having a wrap with Kerlix and looks like an Ace ?wrap based on the pictures. Subsequently following the fact that everything started healing up and looking better the patient then transferred to ?just using the Aquaphor cream which has done excellent. ?Electronic Signature(s) ?Signed: 12/06/2021 1:42:26 PM By: Worthy Keeler PA-C ?Entered By: Worthy Keeler on 12/06/2021 13:42:26 ?Pamela [name], [name] ([id number]) ?-------------------------------------------------------------------------------- ?Physician Orders Details ?Patient Name: Pamela [name], LAICHE. ?Date of Service: 12/06/2021 10:00 AM ?Medical Record Number: [id number] ?Patient Account Number: [id number] ?Date of Birth/Sex: 1961/04/25 (61 y.o. 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 Put garments on first thing when you wake up and remove them before bed. - See ?measurements for proper fitting ?o Moisturize legs daily after removing compression garments. ?o Elevate, Exercise Daily and Avoid Standing for Long Periods of Time. ?o DO YOUR BEST to sleep in the bed at night. DO NOT sleep in your recliner. Long hours of sitting in a recliner leads to ?swelling of the legs and/or potential wounds on your backside. ?Electronic Signature(s) ?Signed: 12/06/2021 4:16:55 PM By: Donnamarie Poag ?Signed: 12/07/2021 5:58:01 PM By: Worthy Keeler PA-C ?Entered ByDonnamarie Poag on 12/06/2021 11:08:59 ?Pamela [name], [name] ([id number]) ?-------------------------------------------------------------------------------- ?Problem List Details ?Patient Name: Pamela [name], MCCUEN. ?Date of Service: 12/06/2021 10:00 AM ?Medical Record Number: [id number] ?Patient Account Number: [id number] ?Date of Birth/Sex: 1960-10-13 (61 y.o. 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

## 2021-12-10 ENCOUNTER — Other Ambulatory Visit: Payer: Self-pay | Admitting: *Deleted

## 2021-12-10 ENCOUNTER — Inpatient Hospital Stay: Payer: BC Managed Care – PPO

## 2021-12-10 ENCOUNTER — Other Ambulatory Visit: Payer: Self-pay

## 2021-12-10 ENCOUNTER — Ambulatory Visit
Admission: RE | Admit: 2021-12-10 | Discharge: 2021-12-10 | Disposition: A | Discharge status: Home / Self Care | Payer: BC Managed Care – PPO | Source: Ambulatory Visit | Attending: Hematology & Oncology | Admitting: Hematology & Oncology

## 2021-12-10 DIAGNOSIS — C3490 Malignant neoplasm of unspecified part of unspecified bronchus or lung: Secondary | ICD-10-CM | POA: Insufficient documentation

## 2021-12-10 DIAGNOSIS — Z5111 Encounter for antineoplastic chemotherapy: Secondary | ICD-10-CM | POA: Diagnosis not present

## 2021-12-10 IMAGING — CT CT CHEST-ABD-PELV W/ CM
3 of 5 series · 14 of 36 positions shown, 16 images · IV contrast (agent unspecified)
Comparison: [DATE]

CLINICAL DATA: Metastatic small-cell lung cancer, assess treatment
response, status post 2 cycles chemotherapy * Tracking Code: BO *

EXAM:
CT CHEST, ABDOMEN, AND PELVIS WITH CONTRAST
TECHNIQUE: Multidetector CT imaging of the chest, abdomen and pelvis was
performed following the standard protocol during bolus
administration of intravenous contrast.

[Series 2: cap with · axial · 0.85mm/px · z∈[-109,+391]mm · 9 of 126 slices shown, 11 images]
[im 13/126  mediastinal]
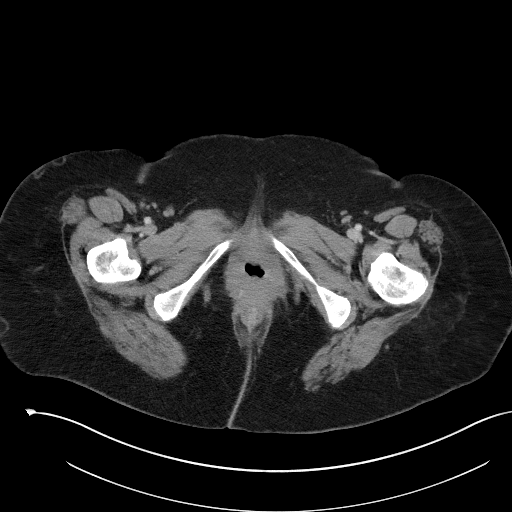
[im 13/126  bone]
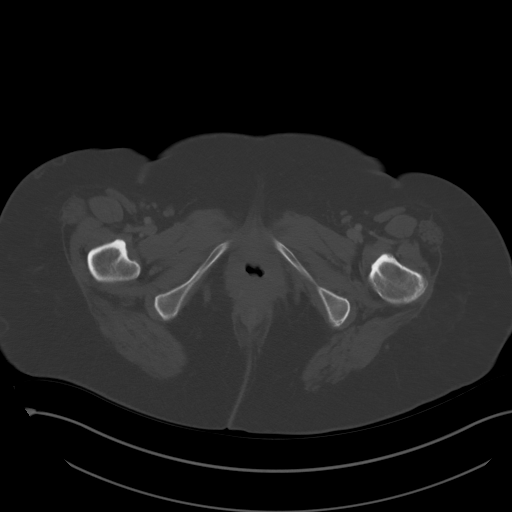
[im 26/126  mediastinal]
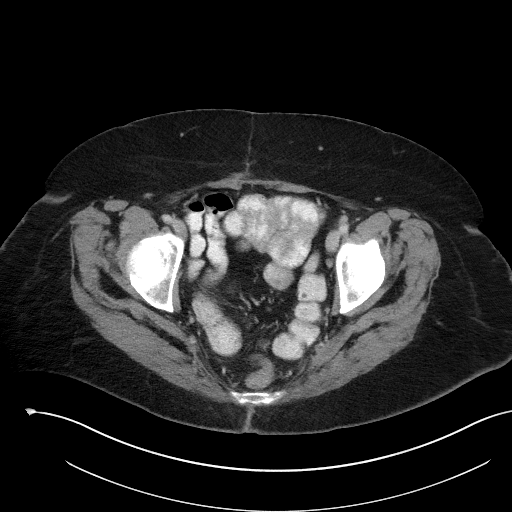
[im 38/126  mediastinal]
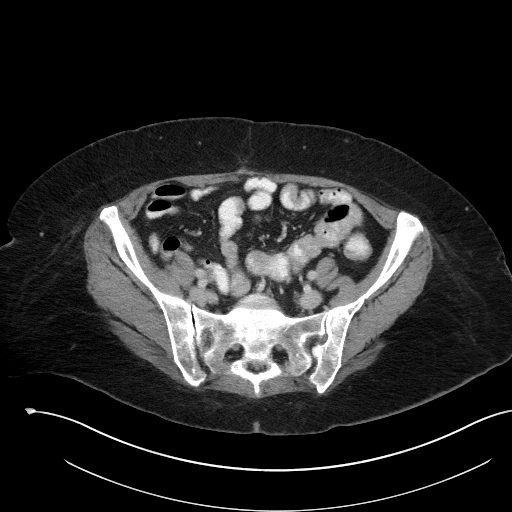
[im 51/126  mediastinal]
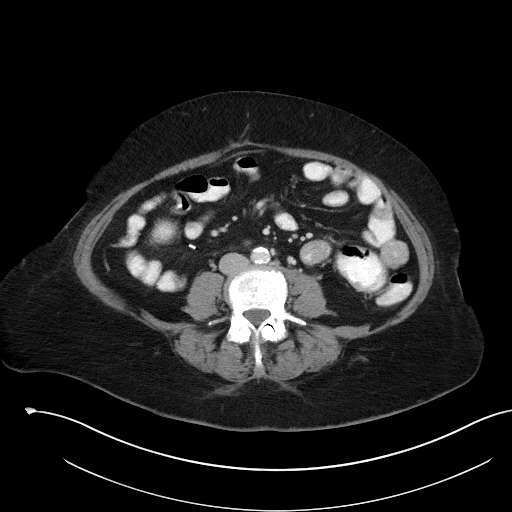
[im 63/126  mediastinal]
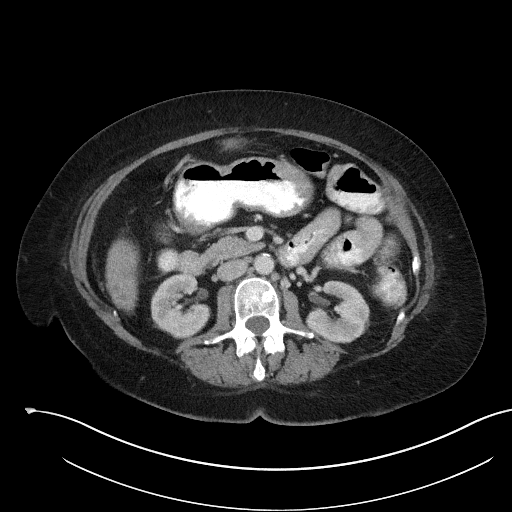
[im 76/126  mediastinal]
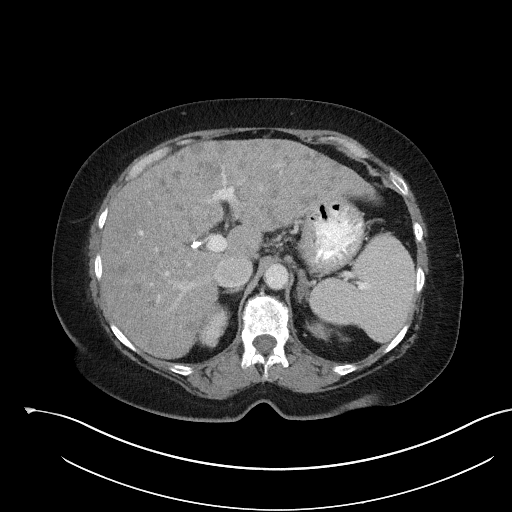
[im 88/126  mediastinal]
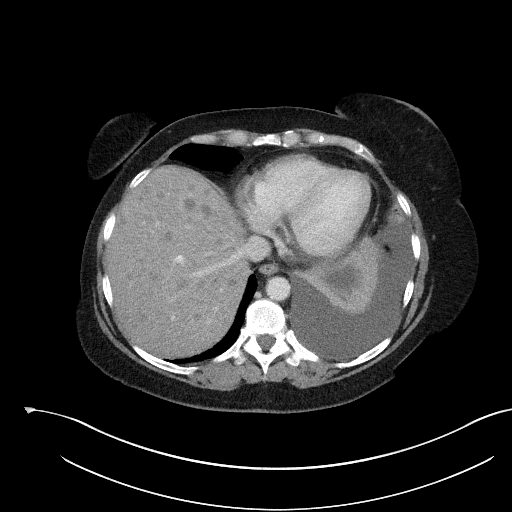
[im 101/126  mediastinal]
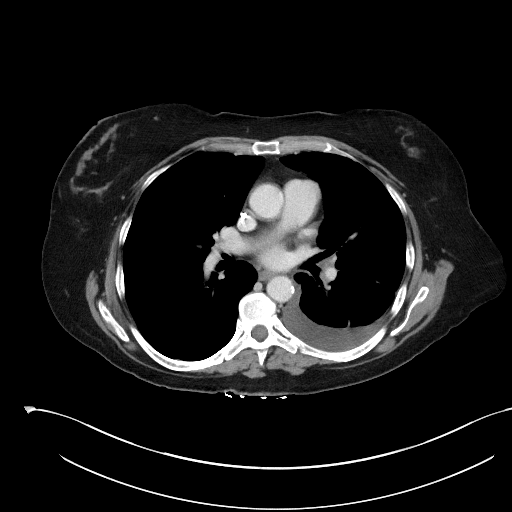
[im 113/126  mediastinal]
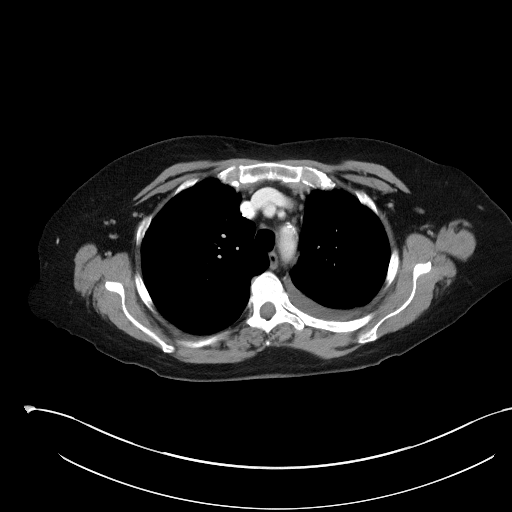
[im 113/126  bone]
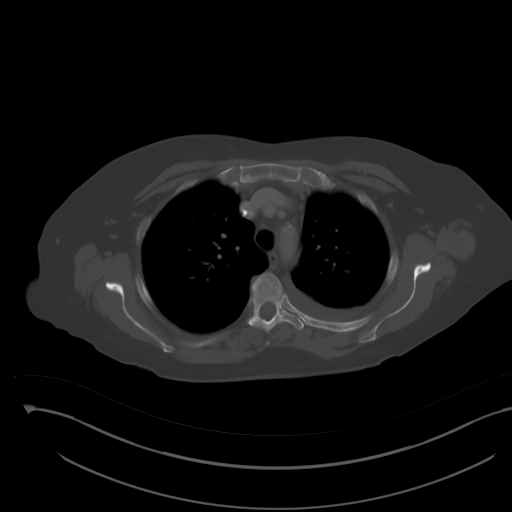

[Series 4: lung · axial · 0.85mm/px · z∈[+204,+250]mm · 2 of 138 slices shown]
[im 12/138  bone]
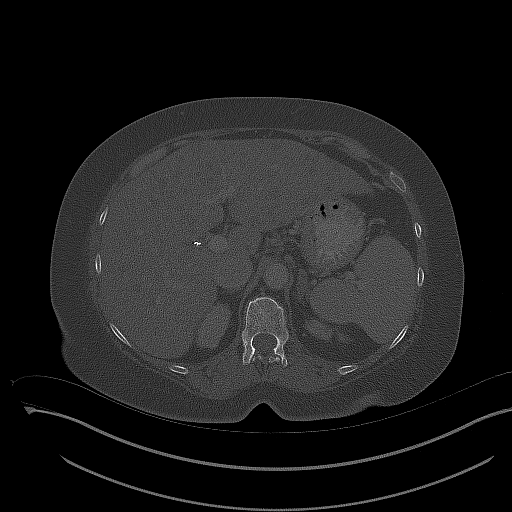
[im 35/138  bone]
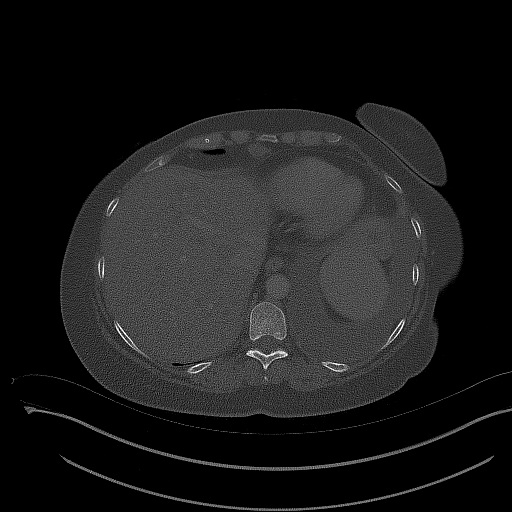

[Series 5: coronals · coronal · 0.95mm/px · 3 of 152 slices shown]
[im 31/152  mediastinal]
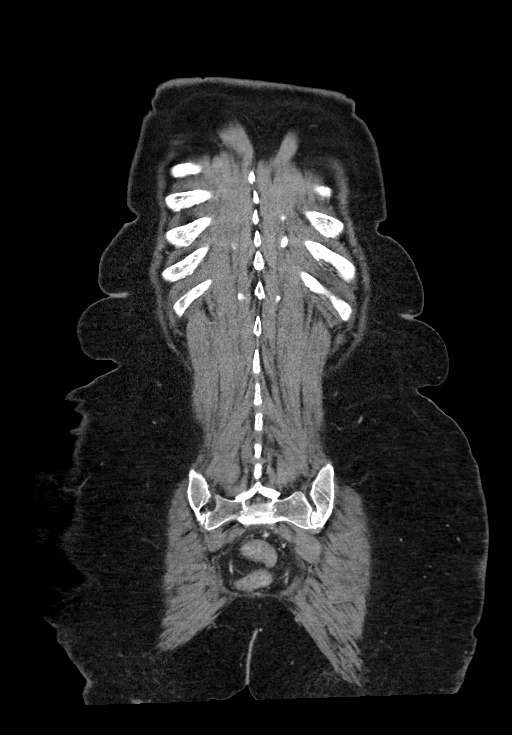
[im 61/152  mediastinal]
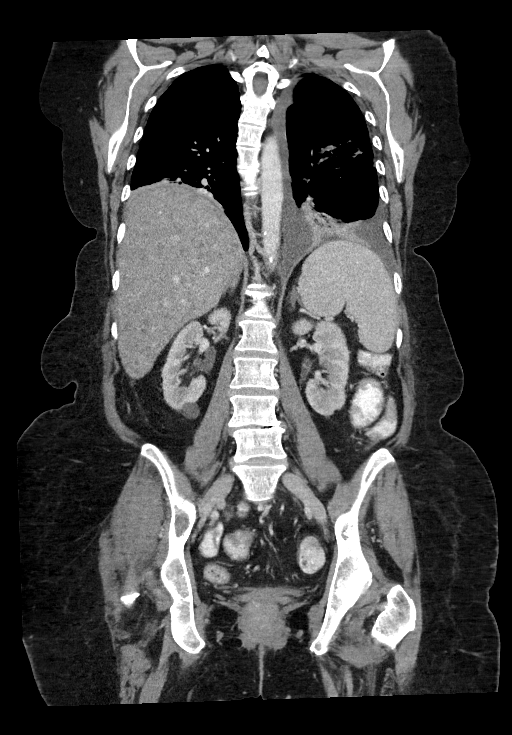
[im 91/152  mediastinal]
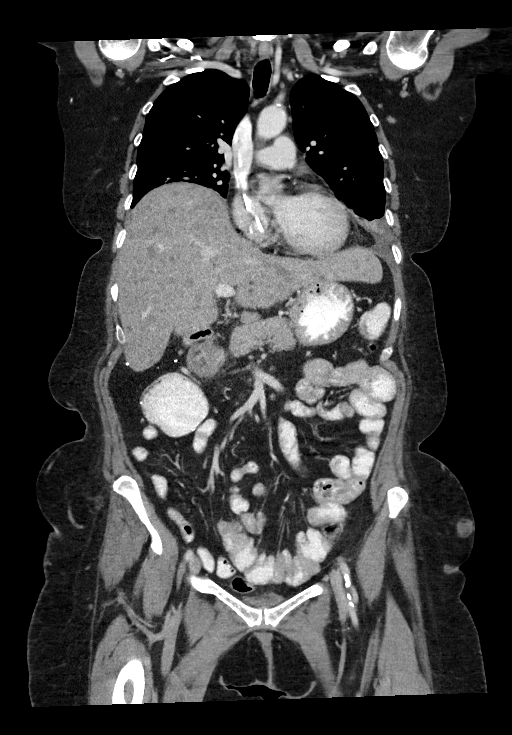

[14 of 36 positions shown; findings below may reference images not displayed]

RADIATION DOSE REDUCTION: This exam was performed according to the
departmental dose-optimization program which includes automated
exposure control, adjustment of the mA and/or kV according to
patient size and/or use of iterative reconstruction technique.

CONTRAST:  100mL OMNIPAQUE IOHEXOL 300 MG/ML SOLN, additional oral
enteric contrast
FINDINGS: CT CHEST FINDINGS

Cardiovascular: Right chest port catheter. Aortic atherosclerosis.
Normal heart size. No pericardial effusion.

Mediastinum/Nodes: Interval resolution of previously seen bulky left
hilar lymph nodes (series 2, image 26). No persistently enlarged
mediastinal, hilar, or axillary lymph nodes. Thyroid gland, trachea,
and esophagus demonstrate no significant findings.

Lungs/Pleura: Interval decrease in size of a mass of the peripheral
superior segment left lower lobe, although amorphous and somewhat
difficult to accurately measure approximately 2.8 x 2.2 cm,
previously 4.7 x 3.3 cm when measured similarly (series 4, image
55). Small left pleural effusion, not significantly changed compared
to prior examination. Previously seen extensive pleural thickening
and nodularity about the left lung is almost completely resolved
(series 2, image 43). Bandlike scarring and or atelectasis of the
right lung base with elevation of the right hemidiaphragm.

Musculoskeletal: No chest wall mass or suspicious osseous lesions
identified.

CT ABDOMEN PELVIS FINDINGS

Hepatobiliary: Innumerable hypodense liver lesions are again seen,
which are now more homogeneous hypoenhancing and generally
diminished in size compared to prior examination. Although it is
difficult to accurately distinguish particular index lesions
comparison to prior, one such lesion in the posterior right lobe of
the liver measures 1.1 x 0.9 cm, previously 2.3 x 2.1 cm (series 2,
image 54). Additional index lesion of the anterior subcapsular left
lobe of the liver, hepatic segments II/III measures 1.4 x 1.1 cm,
previously 2.2 x 2.0 cm (series 2, image 51). Status post
cholecystectomy. No biliary ductal dilatation.

Pancreas: Unremarkable. No pancreatic ductal dilatation or
surrounding inflammatory changes.

Spleen: Normal in size without significant abnormality.

Adrenals/Urinary Tract: Adrenal glands are unremarkable. Kidneys are
normal, without renal calculi, solid lesion, or hydronephrosis.
Bladder is unremarkable.

Stomach/Bowel: Stomach is within normal limits. Status post right
hemicolectomy and ileocolic anastomosis. No evidence of bowel wall
thickening, distention, or inflammatory changes.

Vascular/Lymphatic: Aortic atherosclerosis. No enlarged abdominal or
pelvic lymph nodes.

Reproductive: Status post hysterectomy.

Other: No abdominal wall hernia or abnormality. No ascites.

Musculoskeletal: No acute osseous findings.
IMPRESSION: 1. Interval decrease in size of a mass of the peripheral superior
segment left lower lobe.
2. Interval resolution of previously seen bulky left hilar and
subcarinal lymph nodes. No persistently enlarged mediastinal lymph
nodes.
3. Previously seen extensive pleural thickening and nodularity about
the left lung is almost completely resolved. Small left pleural
effusion, not significantly changed in volume compared to prior
examination.
4. Innumerable hypodense liver lesions are again seen, which are now
more homogeneously hypoenhancing and generally diminished in size
compared to prior examination.
5. Findings are consistent with treatment response of primary lung
malignancy and metastatic disease.
6. No evidence of new metastatic disease in the chest, abdomen, or
pelvis.

Aortic Atherosclerosis ([WU]-[WU]).

## 2021-12-10 MED ORDER — IOHEXOL 300 MG/ML  SOLN
100.0000 mL | Freq: Once | INTRAMUSCULAR | Status: AC | PRN
  Administered 2021-12-10: 100 mL via INTRAVENOUS

## 2021-12-10 MED ORDER — SODIUM CHLORIDE 0.9% IV SOLUTION
250.0000 mL | Freq: Once | INTRAVENOUS | Status: AC
  Administered 2021-12-10: 250 mL via INTRAVENOUS

## 2021-12-10 MED ORDER — HEPARIN SOD (PORK) LOCK FLUSH 100 UNIT/ML IV SOLN
500.0000 [IU] | Freq: Every day | INTRAVENOUS | Status: AC | PRN
  Administered 2021-12-10: 500 [IU]

## 2021-12-10 MED ORDER — DIPHENHYDRAMINE HCL 25 MG PO CAPS
25.0000 mg | ORAL_CAPSULE | Freq: Once | ORAL | Status: AC
  Administered 2021-12-10: 25 mg via ORAL
  Filled 2021-12-10: qty 1

## 2021-12-10 MED ORDER — HYDROXYZINE HCL 10 MG PO TABS: 10.0000 mg | ORAL_TABLET | Freq: Three times a day (TID) | ORAL | 0 refills | Status: DC | PRN

## 2021-12-10 MED ORDER — ACETAMINOPHEN 325 MG PO TABS
650.0000 mg | ORAL_TABLET | Freq: Once | ORAL | Status: AC
  Administered 2021-12-10: 650 mg via ORAL
  Filled 2021-12-10: qty 2

## 2021-12-10 MED ORDER — SODIUM CHLORIDE 0.9% FLUSH
10.0000 mL | INTRAVENOUS | Status: AC | PRN
  Administered 2021-12-10: 10 mL

## 2021-12-10 NOTE — Patient Instructions (Signed)
Anemia ?Anemia is a condition in which there is not enough red blood cells or hemoglobin in the blood. Hemoglobin is a substance in red blood cells that carries oxygen. ?When you do not have enough red blood cells or hemoglobin (are anemic), your body cannot get enough oxygen and your organs may not work properly. As a result, you may feel very tired or have other problems. ?What are the causes? ?Common causes of anemia include: ?Excessive bleeding. Anemia can be caused by excessive bleeding inside or outside the body, including bleeding from the intestines or from heavy menstrual periods in females. ?Poor nutrition. ?Long-lasting (chronic) kidney, thyroid, and liver disease. ?Bone marrow disorders, spleen problems, and blood disorders. ?Cancer and treatments for cancer. ?HIV (human immunodeficiency virus) and AIDS (acquired immunodeficiency syndrome). ?Infections, medicines, and autoimmune disorders that destroy red blood cells. ?What are the signs or symptoms? ?Symptoms of this condition include: ?Minor weakness. ?Dizziness. ?Headache, or difficulties concentrating and sleeping. ?Heartbeats that feel irregular or faster than normal (palpitations). ?Shortness of breath, especially with exercise. ?Pale skin, lips, and nails, or cold hands and feet. ?Indigestion and nausea. ?Symptoms may occur suddenly or develop slowly. If your anemia is mild, you may not have symptoms. ?How is this diagnosed? ?This condition is diagnosed based on blood tests, your medical history, and a physical exam. In some cases, a test may be needed in which cells are removed from the soft tissue inside of a bone and looked at under a microscope (bone marrow biopsy). Your health care provider may also check your stool (feces) for blood and may do additional testing to look for the cause of your bleeding. ?Other tests may include: ?Imaging tests, such as a CT scan or MRI. ?A procedure to see inside your esophagus and stomach (endoscopy). ?A  procedure to see inside your colon and rectum (colonoscopy). ?How is this treated? ?Treatment for this condition depends on the cause. If you continue to lose a lot of blood, you may need to be treated at a hospital. Treatment may include: ?Taking supplements of iron, vitamin E09, or folic acid. ?Taking a hormone medicine (erythropoietin) that can help to stimulate red blood cell growth. ?Having a blood transfusion. This may be needed if you lose a lot of blood. ?Making changes to your diet. ?Having surgery to remove your spleen. ?Follow these instructions at home: ?Take over-the-counter and prescription medicines only as told by your health care provider. ?Take supplements only as told by your health care provider. ?Follow any diet instructions that you were given by your health care provider. ?Keep all follow-up visits as told by your health care provider. This is important. ?Contact a health care provider if: ?You develop new bleeding anywhere in the body. ?Get help right away if: ?You are very weak. ?You are short of breath. ?You have pain in your abdomen or chest. ?You are dizzy or feel faint. ?You have trouble concentrating. ?You have bloody stools, black stools, or tarry stools. ?You vomit repeatedly or you vomit up blood. ?These symptoms may represent a serious problem that is an emergency. Do not wait to see if the symptoms will go away. Get medical help right away. Call your local emergency services (911 in the [place] Do not drive yourself to the hospital. ?Summary ?Anemia is a condition in which you do not have enough red blood cells or enough of a substance in your red blood cells that carries oxygen (hemoglobin). ?Symptoms may occur suddenly or develop slowly. ?If your anemia is  mild, you may not have symptoms. ?This condition is diagnosed with blood tests, a medical history, and a physical exam. Other tests may be needed. ?Treatment for this condition depends on the cause of the anemia. ?This  information is not intended to replace advice given to you by your health care provider. Make sure you discuss any questions you have with your health care provider. ?Document Revised: 08/10/2019 Document Reviewed: 08/10/2019 ?Elsevier Patient Education ? 2022 [employer] ? ?

## 2021-12-10 NOTE — Progress Notes (Signed)
Patient c/o itching on arms and back, benadryl not helping. Per Dr. Marin Olp, atarax 10 mg called to [facility], [place] patient aware. ?

## 2021-12-11 ENCOUNTER — Inpatient Hospital Stay: Payer: BC Managed Care – PPO

## 2021-12-11 ENCOUNTER — Encounter: Payer: Self-pay | Admitting: *Deleted

## 2021-12-11 LAB — BPAM RBC
Blood Product Expiration Date: 202304152359
ISSUE DATE / TIME: 202303270751
Unit Type and Rh: 5100

## 2021-12-11 LAB — TYPE AND SCREEN
ABO/RH(D): O POS
Antibody Screen: NEGATIVE
Unit division: 0

## 2021-12-11 NOTE — Progress Notes (Signed)
Oncology Nurse Navigator Documentation ? ? ?  12/11/2021  ? 10:45 AM  ?Oncology Nurse Navigator Flowsheets  ?Navigator Follow Up Date: 12/17/2021  ?Navigator Follow Up Reason: Follow-up Appointment;Chemotherapy  ?Navigator Location [facility]  ?Navigator Encounter Type Scan Review  ?Patient Visit Type MedOnc  ?Treatment Phase Active Tx  ?Barriers/Navigation Needs Coordination of Care;Education  ?Interventions None Required  ?Acuity Level 3-Moderate Needs (3-4 Barriers Identified)  ?Support Groups/Services Friends and Family  ?Time Spent with Patient 15  ?  ?

## 2021-12-11 NOTE — Progress Notes (Signed)
[name], MD  P Onc Nurse Hp ?Call - the response has been fantastic!!  The cancer is pretty much gone in the lungs!!  It is markedly decreased in the liver!!!   Pete  ? ?Patient is aware of results.  ? ?Oncology Nurse Navigator Documentation ? ? ?  12/11/2021  ? 12:30 PM  ?Oncology Nurse Navigator Flowsheets  ?Navigator Follow Up Date: 12/17/2021  ?Navigator Follow Up Reason: Follow-up Appointment  ?Navigator Location [facility]  ?Navigator Encounter Type Telephone  ?Telephone Diagnostic Results  ?Patient Visit Type MedOnc  ?Treatment Phase Active Tx  ?Barriers/Navigation Needs Coordination of Care;Education  ?Education Other  ?Interventions Education;Psycho-Social Support  ?Acuity Level 3-Moderate Needs (3-4 Barriers Identified)  ?Education Method Verbal  ?Support Groups/Services Friends and Family  ?Time Spent with Patient 15  ?  ?

## 2021-12-13 ENCOUNTER — Ambulatory Visit
Admission: RE | Admit: 2021-12-13 | Discharge: 2021-12-13 | Disposition: A | Discharge status: Home / Self Care | Payer: BC Managed Care – PPO | Source: Ambulatory Visit | Attending: Hematology & Oncology | Admitting: Hematology & Oncology

## 2021-12-13 ENCOUNTER — Encounter: Payer: Self-pay | Admitting: *Deleted

## 2021-12-13 DIAGNOSIS — C3490 Malignant neoplasm of unspecified part of unspecified bronchus or lung: Secondary | ICD-10-CM | POA: Diagnosis not present

## 2021-12-13 IMAGING — MR MR HEAD WO/W CM
15 series · 48 of 48 positions shown · IV contrast (8ml Gadavist)
Comparison: Brain MRI [DATE]

CLINICAL DATA: 60-year-old female with extensive stage small cell
lung cancer. 5 mm subdural hematoma on brain MRI last month.
Restaging .

EXAM:
MRI HEAD WITHOUT AND WITH CONTRAST
TECHNIQUE: Multiplanar, multiecho pulse sequences of the brain and surrounding
structures were obtained without and with intravenous contrast.
CONTRAST:  7.5mL GADAVIST GADOBUTROL 1 MMOL/ML IV SOLN

[Series 5: ax dwi_tracew · axial · 3.0mm · 0.65mm/px · z∈[-144,+11]mm · 4 of 48 slices shown]
[im 1/48]
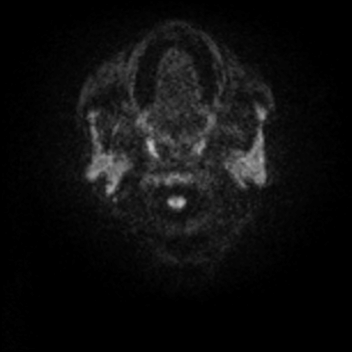
[im 16/48]
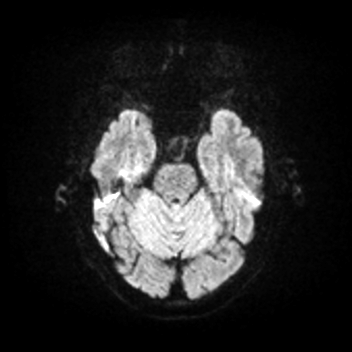
[im 32/48]
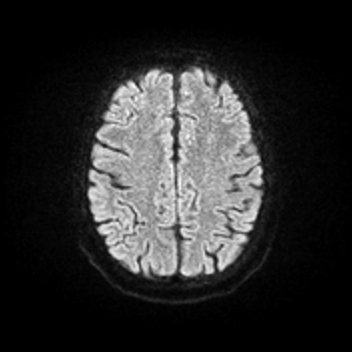
[im 48/48]
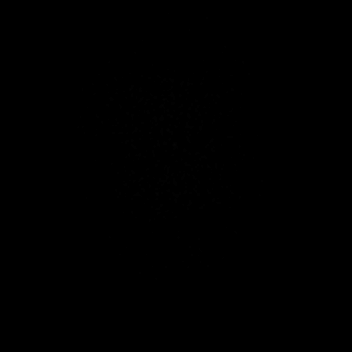

[Series 6: ax dwi_adc · axial · 3.0mm · 0.65mm/px · z∈[-144,-2]mm · 2 of 44 slices shown]
[im 1/44]
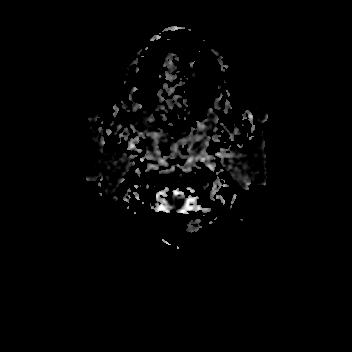
[im 44/44]
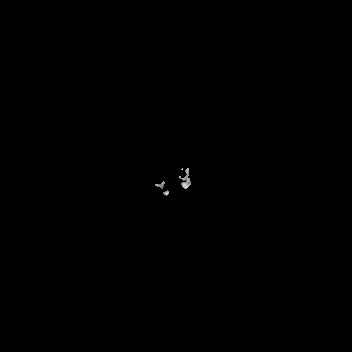

[Series 7: cor dwi_tracew · coronal · 5.0mm · 0.60mm/px · 2 of 38 slices shown]
[im 1/38]
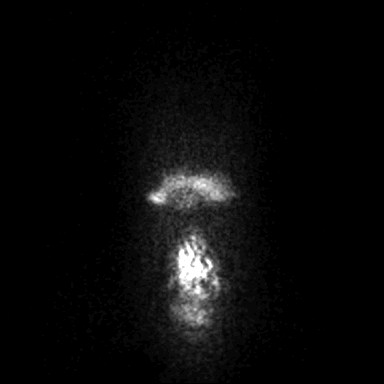
[im 38/38]
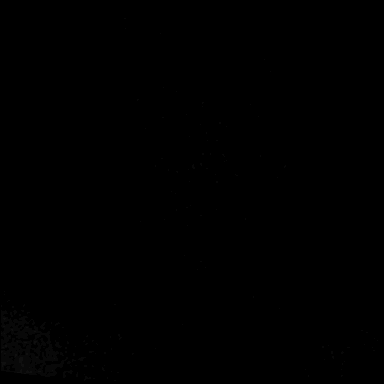

[Series 8: cor dwi_adc · coronal · 5.0mm · 0.60mm/px · 2 of 36 slices shown]
[im 1/36]
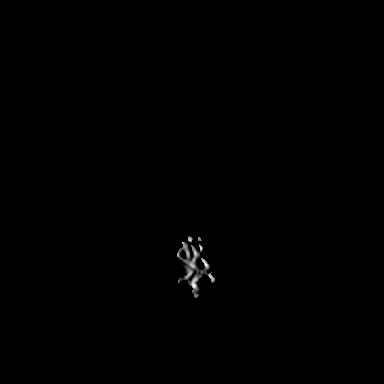
[im 36/36]
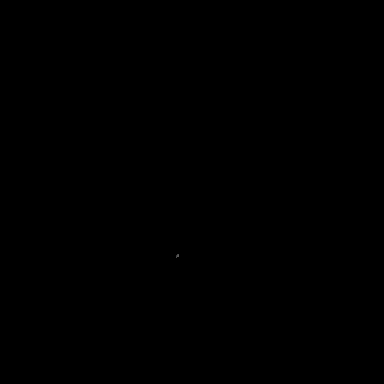

[Series 9: T1 · sagittal · 5.0mm · 0.62mm/px · 1 of 23 slices shown (1 of 2)]
[im 1/23]
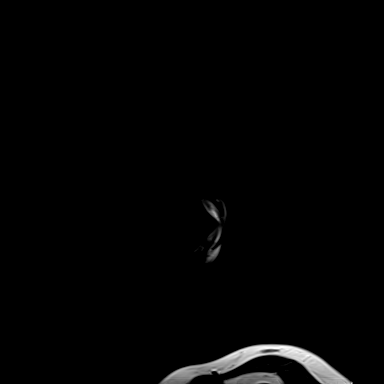

[Series 10: T2 · axial · 5.0mm · 0.53mm/px · 1 of 27 slices shown]
[im 1/27]
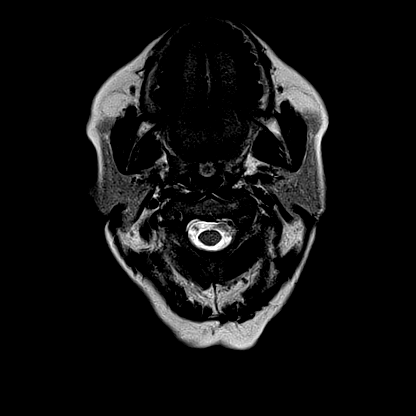

[Series 11: ax swi_mag · axial · 2.0mm · 0.90mm/px · z∈[-145,+12]mm · 4 of 79 slices shown]
[im 1/79]
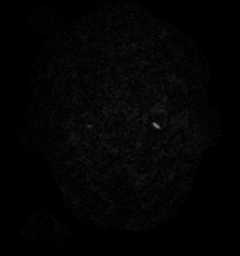
[im 27/79]
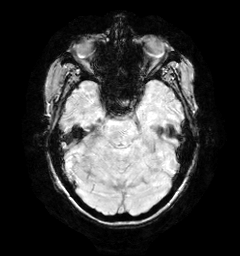
[im 53/79]
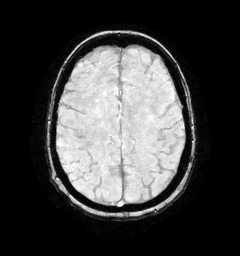
[im 79/79]
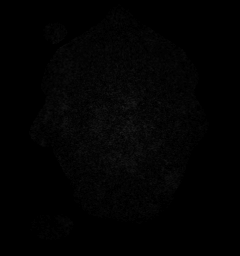

[Series 12: ax swi_pha · axial · 2.0mm · 0.90mm/px · z∈[-145,+12]mm · 4 of 79 slices shown]
[im 1/79]
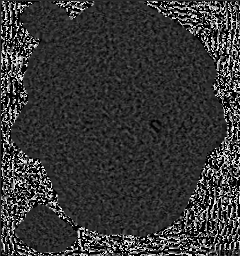
[im 27/79]
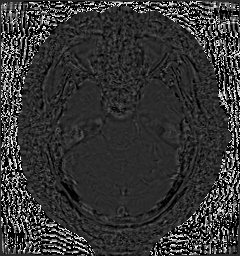
[im 53/79]
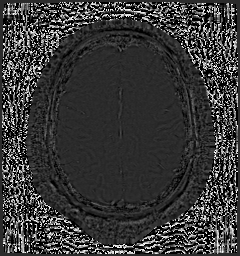
[im 79/79]
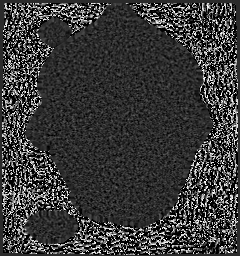

[Series 13: ax swi_swi · axial · 2.0mm · 0.90mm/px · z∈[-145,+12]mm · 4 of 79 slices shown]
[im 1/79]
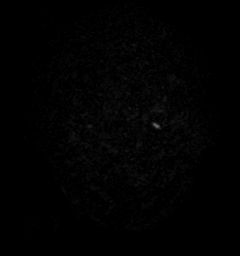
[im 27/79]
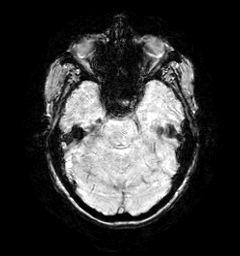
[im 53/79]
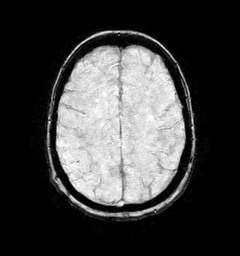
[im 79/79]
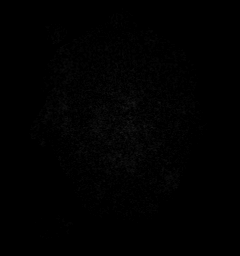

[Series 15: FLAIR · axial · 3.0mm · 0.53mm/px · z∈[-146,+15]mm · 3 of 55 slices shown]
[im 1/55]
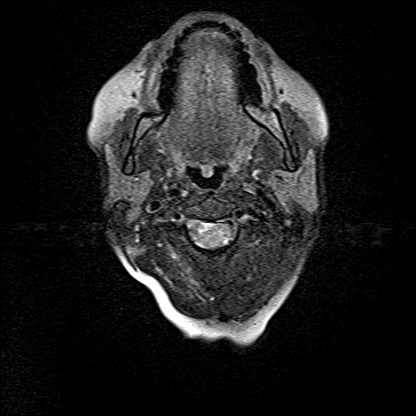
[im 28/55]
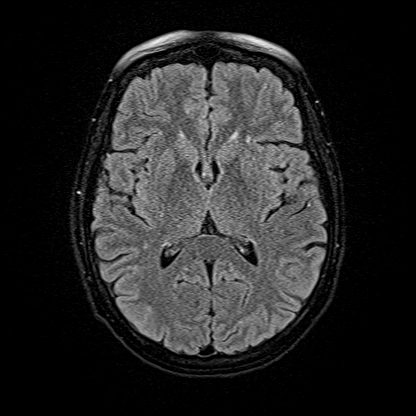
[im 55/55]
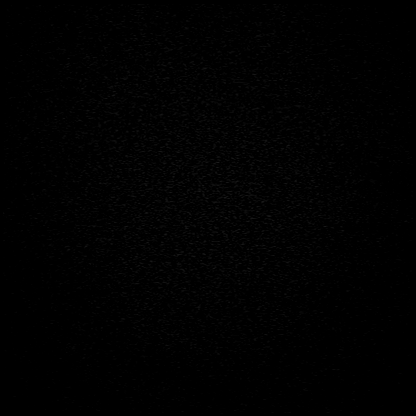

[Series 16: T1 · axial · 1.0mm · 0.98mm/px · z∈[-146,+12]mm · 8 of 160 slices shown (2 of 2)]
[im 1/160]
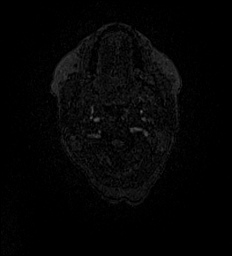
[im 23/160]
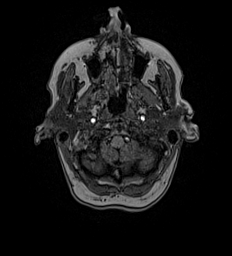
[im 46/160]
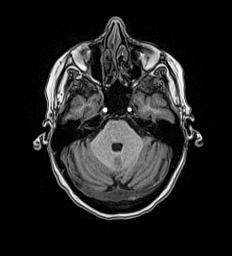
[im 69/160]
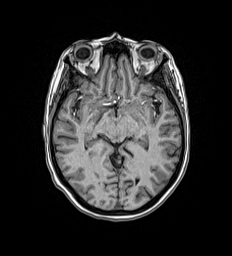
[im 91/160]
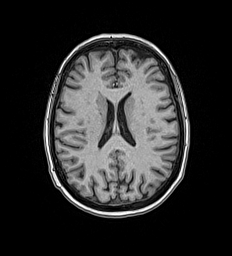
[im 114/160]
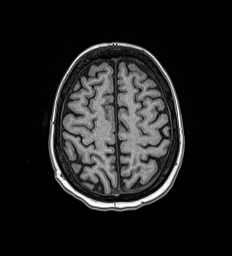
[im 137/160]
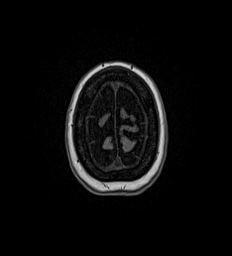
[im 160/160]
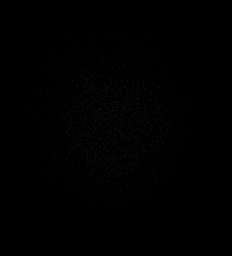

[Series 17: T2 post-contrast · coronal · 5.0mm · 0.57mm/px · 2 of 29 slices shown]
[im 1/29]
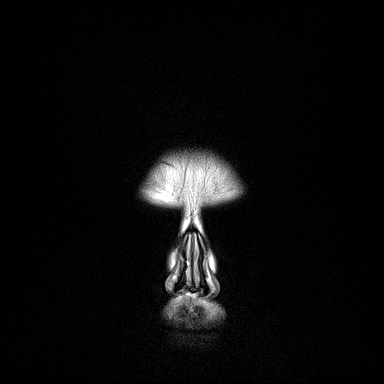
[im 29/29]
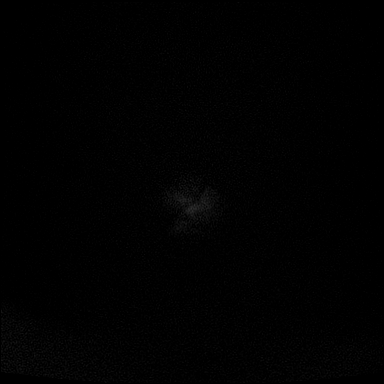

[Series 18: T1 post-contrast · axial · 1.0mm · 0.98mm/px · z∈[-146,+12]mm · 8 of 160 slices shown (1 of 3)]
[im 1/160]
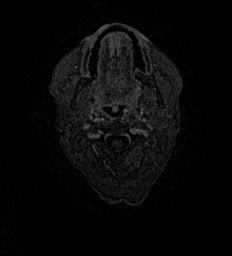
[im 23/160]
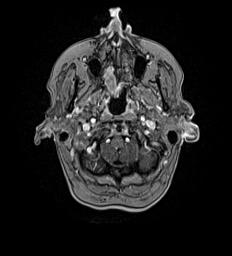
[im 46/160]
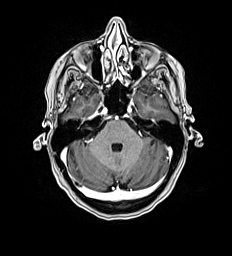
[im 69/160]
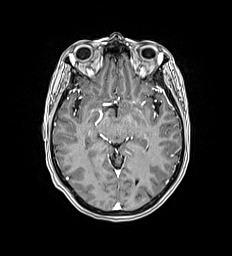
[im 91/160]
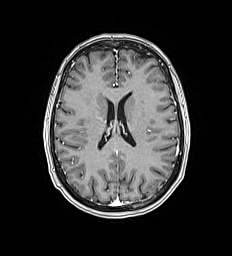
[im 114/160]
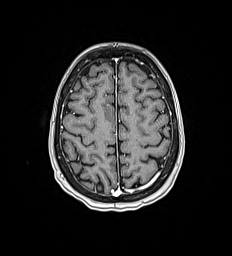
[im 137/160]
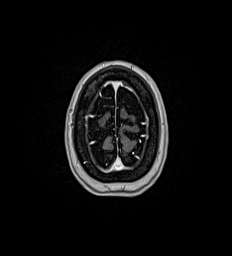
[im 160/160]
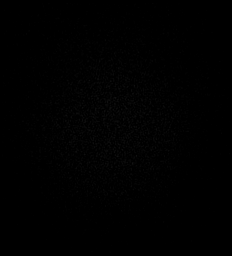

[Series 19: T1 post-contrast · coronal · 5.0mm · 0.57mm/px · 2 of 29 slices shown (2 of 3)]
[im 1/29]
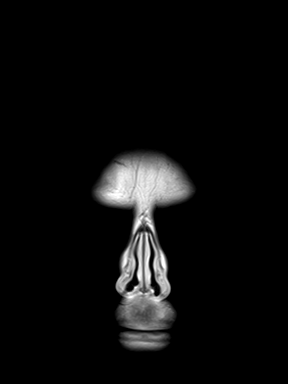
[im 29/29]
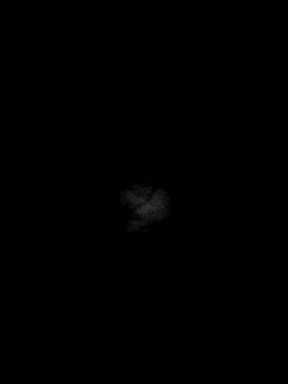

[Series 20: T1 post-contrast · sagittal · 5.0mm · 0.62mm/px · 1 of 23 slices shown (3 of 3)]
[im 1/23]
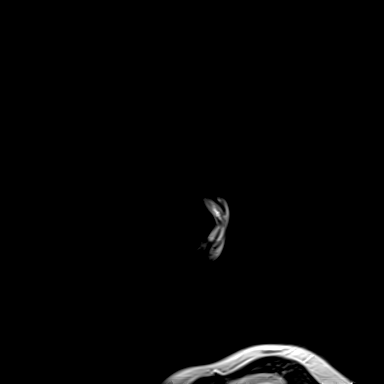

[48 of 48 positions shown; findings below may reference images not displayed]

FINDINGS: Brain: Previously seen small left posterior convexity extra-axial
fluid/subdural blood has resolved.

No restricted diffusion to suggest acute infarction. No midline
shift, mass effect, evidence of mass lesion, ventriculomegaly,
extra-axial collection or acute intracranial hemorrhage.
Cervicomedullary junction and pituitary are within normal limits.

No abnormal enhancement identified.  No dural thickening identified.

Mild for age scattered nonspecific cerebral white matter small T2
and FLAIR hyperintense foci in both hemispheres are stable. No
cortical encephalomalacia. No chronic cerebral blood products.

Vascular: Major intracranial vascular flow voids are stable. The
major dural venous sinuses are enhancing and appear to be patent.

Skull and upper cervical spine: Negative visible cervical spine.
Visualized bone marrow signal is within normal limits.

Sinuses/Orbits: Stable and negative.

Other: Resolved small left posterior convexity scalp hematoma that
was associated with the small SDH last month. Visible internal
auditory structures appear normal. Mastoids are clear.
IMPRESSION: 1. No metastatic disease or acute intracranial abnormality.
2. Resolved small left subdural hematoma since last month which was
likely posttraumatic.

## 2021-12-13 MED ORDER — GADOBUTROL 1 MMOL/ML IV SOLN
7.5000 mL | Freq: Once | INTRAVENOUS | Status: AC | PRN
  Administered 2021-12-13: 7.5 mL via INTRAVENOUS

## 2021-12-13 NOTE — Progress Notes (Signed)
Oncology Nurse Navigator Documentation ? ? ?  12/13/2021  ? 12:00 PM  ?Oncology Nurse Navigator Flowsheets  ?Navigator Follow Up Date: 12/17/2021  ?Navigator Follow Up Reason: Follow-up Appointment;Chemotherapy  ?Navigator Location [facility]  ?Navigator Encounter Type Scan Review  ?Patient Visit Type MedOnc  ?Treatment Phase Active Tx  ?Barriers/Navigation Needs Coordination of Care;Education  ?Interventions None Required  ?Acuity Level 3-Moderate Needs (3-4 Barriers Identified)  ?Support Groups/Services Friends and Family  ?Time Spent with Patient 15  ?  ?

## 2021-12-17 ENCOUNTER — Inpatient Hospital Stay: Payer: BC Managed Care – PPO | Admitting: Hematology & Oncology

## 2021-12-17 ENCOUNTER — Other Ambulatory Visit: Payer: Self-pay

## 2021-12-17 ENCOUNTER — Inpatient Hospital Stay: Payer: BC Managed Care – PPO

## 2021-12-17 ENCOUNTER — Encounter: Payer: Self-pay | Admitting: Hematology & Oncology

## 2021-12-17 ENCOUNTER — Inpatient Hospital Stay: Payer: BC Managed Care – PPO | Attending: Oncology

## 2021-12-17 VITALS — BP 116/44 | HR 72 | Temp 97.9°F | Resp 17 | Wt 186.0 lb

## 2021-12-17 DIAGNOSIS — C3432 Malignant neoplasm of lower lobe, left bronchus or lung: Secondary | ICD-10-CM | POA: Insufficient documentation

## 2021-12-17 DIAGNOSIS — Z5112 Encounter for antineoplastic immunotherapy: Secondary | ICD-10-CM | POA: Diagnosis not present

## 2021-12-17 DIAGNOSIS — Z5189 Encounter for other specified aftercare: Secondary | ICD-10-CM | POA: Insufficient documentation

## 2021-12-17 DIAGNOSIS — C3490 Malignant neoplasm of unspecified part of unspecified bronchus or lung: Secondary | ICD-10-CM

## 2021-12-17 DIAGNOSIS — Z5111 Encounter for antineoplastic chemotherapy: Secondary | ICD-10-CM | POA: Diagnosis present

## 2021-12-17 DIAGNOSIS — C787 Secondary malignant neoplasm of liver and intrahepatic bile duct: Secondary | ICD-10-CM | POA: Diagnosis not present

## 2021-12-17 LAB — CBC WITH DIFFERENTIAL (CANCER CENTER ONLY)
Abs Immature Granulocytes: 0.53 10*3/uL — ABNORMAL HIGH (ref 0.00–0.07)
Basophils Absolute: 0.1 10*3/uL (ref 0.0–0.1)
Basophils Relative: 1 %
Eosinophils Absolute: 0.1 10*3/uL (ref 0.0–0.5)
Eosinophils Relative: 1 %
HCT: 31 % — ABNORMAL LOW (ref 36.0–46.0)
Hemoglobin: 10.2 g/dL — ABNORMAL LOW (ref 12.0–15.0)
Immature Granulocytes: 5 %
Lymphocytes Relative: 14 %
Lymphs Abs: 1.6 10*3/uL (ref 0.7–4.0)
MCH: 31.1 pg (ref 26.0–34.0)
MCHC: 32.9 g/dL (ref 30.0–36.0)
MCV: 94.5 fL (ref 80.0–100.0)
Monocytes Absolute: 1.3 10*3/uL — ABNORMAL HIGH (ref 0.1–1.0)
Monocytes Relative: 12 %
Neutro Abs: 7.5 10*3/uL (ref 1.7–7.7)
Neutrophils Relative %: 67 %
Platelet Count: 272 10*3/uL (ref 150–400)
RBC: 3.28 MIL/uL — ABNORMAL LOW (ref 3.87–5.11)
RDW: 17.5 % — ABNORMAL HIGH (ref 11.5–15.5)
WBC Count: 11 10*3/uL — ABNORMAL HIGH (ref 4.0–10.5)
nRBC: 0 % (ref 0.0–0.2)

## 2021-12-17 LAB — CMP (CANCER CENTER ONLY)
ALT: 24 U/L (ref 0–44)
AST: 37 U/L (ref 15–41)
Albumin: 4 g/dL (ref 3.5–5.0)
Alkaline Phosphatase: 191 U/L — ABNORMAL HIGH (ref 38–126)
Anion gap: 11 (ref 5–15)
BUN: 10 mg/dL (ref 8–23)
CO2: 27 mmol/L (ref 22–32)
Calcium: 8.3 mg/dL — ABNORMAL LOW (ref 8.9–10.3)
Chloride: 101 mmol/L (ref 98–111)
Creatinine: 0.8 mg/dL (ref 0.44–1.00)
GFR, Estimated: >60 mL/min (ref >60)
Glucose, Bld: 137 mg/dL — ABNORMAL HIGH (ref 70–99)
Potassium: 4 mmol/L (ref 3.5–5.1)
Sodium: 139 mmol/L (ref 135–145)
Total Bilirubin: 0.6 mg/dL (ref 0.3–1.2)
Total Protein: 6.8 g/dL (ref 6.5–8.1)

## 2021-12-17 LAB — LACTATE DEHYDROGENASE: LDH: 279 U/L — ABNORMAL HIGH (ref 98–192)

## 2021-12-17 LAB — MAGNESIUM: Magnesium: 1 mg/dL — ABNORMAL LOW (ref 1.7–2.4)

## 2021-12-17 MED ORDER — SODIUM CHLORIDE 0.9 % IV SOLN: Freq: Once | INTRAVENOUS | Status: AC

## 2021-12-17 MED ORDER — HEPARIN SOD (PORK) LOCK FLUSH 100 UNIT/ML IV SOLN
500.0000 [IU] | Freq: Once | INTRAVENOUS | Status: AC | PRN
  Administered 2021-12-17: 500 [IU]

## 2021-12-17 MED ORDER — ACETAMINOPHEN 325 MG PO TABS
650.0000 mg | ORAL_TABLET | Freq: Once | ORAL | Status: AC
  Administered 2021-12-17: 650 mg via ORAL
  Filled 2021-12-17: qty 2

## 2021-12-17 MED ORDER — SODIUM CHLORIDE 0.9 % IV SOLN
1200.0000 mg | Freq: Once | INTRAVENOUS | Status: AC
  Administered 2021-12-17: 1200 mg via INTRAVENOUS
  Filled 2021-12-17: qty 20

## 2021-12-17 MED ORDER — PALONOSETRON HCL INJECTION 0.25 MG/5ML
0.2500 mg | Freq: Once | INTRAVENOUS | Status: AC
  Administered 2021-12-17: 0.25 mg via INTRAVENOUS
  Filled 2021-12-17: qty 5

## 2021-12-17 MED ORDER — SODIUM CHLORIDE 0.9 % IV SOLN
650.0000 mg | Freq: Once | INTRAVENOUS | Status: AC
  Administered 2021-12-17: 650 mg via INTRAVENOUS
  Filled 2021-12-17: qty 40

## 2021-12-17 MED ORDER — SODIUM CHLORIDE 0.9 % IV SOLN
90.0000 mg/m2 | Freq: Once | INTRAVENOUS | Status: AC
  Administered 2021-12-17: 190 mg via INTRAVENOUS
  Filled 2021-12-17: qty 9.5

## 2021-12-17 MED ORDER — SODIUM CHLORIDE 0.9% FLUSH
10.0000 mL | INTRAVENOUS | Status: DC | PRN
  Administered 2021-12-17: 10 mL

## 2021-12-17 MED ORDER — SODIUM CHLORIDE 0.9 % IV SOLN
10.0000 mg | Freq: Once | INTRAVENOUS | Status: AC
  Administered 2021-12-17: 10 mg via INTRAVENOUS
  Filled 2021-12-17: qty 10

## 2021-12-17 MED ORDER — SODIUM CHLORIDE 0.9 % IV SOLN
150.0000 mg | Freq: Once | INTRAVENOUS | Status: AC
  Administered 2021-12-17: 150 mg via INTRAVENOUS
  Filled 2021-12-17: qty 150

## 2021-12-17 NOTE — Progress Notes (Signed)
?Hematology and Oncology Follow Up Visit ? ?Pamela [name] ?[id number] ?Dec 31, 1960 61 y.o. ?12/17/2021 ? ? ?Principle Diagnosis:  ?Extensive stage small cell lung cancer ? ?Current Therapy:   ?Status post cycle #2 of carboplatinum/etoposide/Tecentriq -1st cycle given in the hospital on 11/05/2021 ?    ?Interim History:  Pamela [name] is back for follow-up.  She had 2 cycles of treatment.  She has responded as I expected.  She did have a CT scan done last week.  The CT scan showed a very nice response.  Most of the pulmonary disease was gone.  She did have some liver metastasis but this appeared to be much better.  There is no bony lesions. ? ?Her legs look a whole lot better.  She still has little bit of swelling but not nearly with which she had before. ? ?I must say that she has tolerated treatment incredibly well.  We are certainly given her aggressive therapy.  I really not had to adjust the doses much since she has normal renal and hepatic function now. ? ?She is eating well.  She had a good weekend.  She had no problems with fever.  We did have her on ciprofloxacin. ? ?We did give her a blood transfusion after the last cycle.  I suppose I should not be too surprised by this. ? ?She also had MRI of the brain.  There was no evidence of any subdural.  What ever she had before on a previous MRI had resolved. ? ?She has had no problems with cough.  She has had no problems with bowels or bladder. ? ?Overall, I would say performance status is probably ECOG 1.   ? ?Medications:  ?Current Outpatient Medications:  ?  albuterol (VENTOLIN HFA) 108 (90 Base) MCG/ACT inhaler, 2 puffs every 6 (six) hours as needed for wheezing or shortness of breath., Disp: , Rfl:  ?  ciprofloxacin (CIPRO) 500 MG tablet, Take 1 tablet (500 mg total) by mouth daily with breakfast., Disp: 15 tablet, Rfl: 4 ?  dexamethasone (DECADRON) 4 MG tablet, Take 2 tablets (8 mg total) by mouth daily. Start the day after chemotherapy for 1 day., Disp: 30 tablet,  Rfl: 1 ?  esomeprazole (NEXIUM) 20 MG capsule, Take 20 mg by mouth daily at 12 noon., Disp: , Rfl:  ?  fentaNYL (DURAGESIC) 12 MCG/HR, Place 1 patch onto the skin every 3 (three) days. (Patient not taking: Reported on 11/26/2021), Disp: 5 patch, Rfl: 0 ?  furosemide (LASIX) 40 MG tablet, Take 1 tablet (40 mg total) by mouth daily., Disp: 30 tablet, Rfl: 11 ?  hydrOXYzine (ATARAX) 10 MG tablet, Take 1 tablet (10 mg total) by mouth 3 (three) times daily as needed., Disp: 30 tablet, Rfl: 0 ?  levothyroxine (SYNTHROID) 125 MCG tablet, Take 0.5 tablets (62.5 mcg total) by mouth daily before breakfast., Disp: 15 tablet, Rfl: 1 ?  LORazepam (ATIVAN) 0.5 MG tablet, Take 1 tablet (0.5 mg total) by mouth every 6 (six) hours as needed (Nausea or vomiting)., Disp: 30 tablet, Rfl: 0 ?  midodrine (PROAMATINE) 5 MG tablet, Take 1 tablet (5 mg total) by mouth 3 (three) times daily with meals., Disp: 90 tablet, Rfl: 0 ?  Multiple Vitamin (MULTIVITAMIN) tablet, Take 1 tablet by mouth daily., Disp: , Rfl:  ?  naloxone (NARCAN) nasal spray 4 mg/0.1 mL, 1 spray as needed (overdose). (Patient not taking: Reported on 11/26/2021), Disp: , Rfl:  ?  ondansetron (ZOFRAN) 8 MG tablet, Take 1 tablet (8 mg  total) by mouth 2 (two) times daily as needed for refractory nausea / vomiting. Start on day 3 after carboplatin chemo., Disp: 30 tablet, Rfl: 1 ?  Oxycodone HCl 10 MG TABS, Take 1 tablet (10 mg total) by mouth every 3 (three) hours as needed (breakthrough pain)., Disp: 90 tablet, Rfl: 0 ?  potassium chloride SA (KLOR-CON M) 20 MEQ tablet, Take 2 tablets (40 mEq total) by mouth 2 (two) times daily., Disp: 120 tablet, Rfl: 2 ?  prochlorperazine (COMPAZINE) 10 MG tablet, Take 1 tablet (10 mg total) by mouth every 6 (six) hours as needed (Nausea or vomiting)., Disp: 30 tablet, Rfl: 1 ?  senna-docusate (SENOKOT-S) 8.6-50 MG tablet, Take 1 tablet by mouth 2 (two) times daily., Disp: , Rfl:  ?  silver sulfADIAZINE (SILVADENE) 1 % cream, Apply  Silvadene to bilat legs Q day, then cover with ABD pads and apply kerlex and an ace wrap in a spiral fashion, Disp: 50 g, Rfl: 2 ?  simvastatin (ZOCOR) 40 MG tablet, Take 40 mg by mouth every evening., Disp: , Rfl:  ?  sitaGLIPtin-metformin (JANUMET) 50-500 MG tablet, Take 1 tablet by mouth 2 (two) times daily with a meal., Disp: , Rfl:  ?No current facility-administered medications for this visit. ? ?Facility-Administered Medications Ordered in Other Visits:  ?  etoposide (VEPESID) 190 mg in sodium chloride 0.9 % 500 mL chemo infusion, 90 mg/m2 (Treatment Plan Recorded), Intravenous, Once, Joscelyne Renville, Rudell Cobb, MD, Last Rate: 510 mL/hr at 12/17/21 1229, 190 mg at 12/17/21 1229 ?  heparin lock flush 100 unit/mL, 500 Units, Intracatheter, Once PRN, [name], MD ?  sodium chloride flush (NS) 0.9 % injection 10 mL, 10 mL, Intracatheter, PRN, [name], MD ? ?Allergies:  ?Allergies  ?Allergen Reactions  ? Rofecoxib Swelling  ? ? ?Past Medical History, Surgical history, Social history, and Family History were reviewed and updated. ? ?Review of Systems: ?Review of Systems  ?Constitutional: Negative.   ?HENT:  Negative.    ?Eyes: Negative.   ?Respiratory: Negative.    ?Cardiovascular:  Positive for leg swelling.  ?Gastrointestinal: Negative.   ?Endocrine: Negative.   ?Genitourinary: Negative.    ?Skin:  Positive for itching.  ?Neurological: Negative.   ?Hematological: Negative.   ?Psychiatric/Behavioral: Negative.    ? ?Physical Exam: ? weight is 186 lb (84.4 kg). Her oral temperature is 97.9 ?F (36.6 ?C). Her blood pressure is 116/44 (abnormal) and her pulse is 72. Her respiration is 17 and oxygen saturation is 99%.  ? ?Wt Readings from Last 3 Encounters:  ?12/17/21 186 lb (84.4 kg)  ?12/07/21 186 lb (84.4 kg)  ?11/26/21 203 lb (92.1 kg)  ? ? ?Physical Exam ?Vitals reviewed.  ?HENT:  ?   Head: Normocephalic and atraumatic.  ?Eyes:  ?   Pupils: Pupils are equal, round, and reactive to light.  ?Cardiovascular:  ?    Rate and Rhythm: Normal rate and regular rhythm.  ?   Heart sounds: Normal heart sounds.  ?Pulmonary:  ?   Effort: Pulmonary effort is normal.  ?   Breath sounds: Normal breath sounds.  ?   Comments: Lungs sound clear bilaterally.  She may have some slight decrease at the left base.  She has good air movement bilaterally.  There is no wheezing. ?Abdominal:  ?   General: Bowel sounds are normal.  ?   Palpations: Abdomen is soft.  ?   Comments: Abdominal exam shows a soft abdomen.  She is somewhat obese.  She has no  fluid wave.  There is no guarding or rebound tenderness.  I really cannot palpate her liver edge.  ?Musculoskeletal:     ?   General: No tenderness or deformity. Normal range of motion.  ?   Cervical back: Normal range of motion.  ?   Comments: Extremities shows marked improvement in the leg edema.  She still has a little bit of erythema in the lower legs.  There is very little edema in her feet.  She has good range of motion of her legs.   ?Lymphadenopathy:  ?   Cervical: No cervical adenopathy.  ?Skin: ?   General: Skin is warm and dry.  ?   Findings: No erythema or rash.  ?Neurological:  ?   Mental Status: She is alert and oriented to person, place, and time.  ?Psychiatric:     ?   Behavior: Behavior normal.     ?   Thought Content: Thought content normal.     ?   Judgment: Judgment normal.  ? ? ? ?Lab Results  ?Component Value Date  ? WBC 11.0 (H) 12/17/2021  ? HGB 10.2 (L) 12/17/2021  ? HCT 31.0 (L) 12/17/2021  ? MCV 94.5 12/17/2021  ? PLT 272 12/17/2021  ? ?  Chemistry   ?   ?Component Value Date/Time  ? NA 139 12/17/2021 0857  ? K 4.0 12/17/2021 0857  ? CL 101 12/17/2021 0857  ? CO2 27 12/17/2021 0857  ? BUN 10 12/17/2021 0857  ? CREATININE 0.80 12/17/2021 0857  ?    ?Component Value Date/Time  ? CALCIUM 8.3 (L) 12/17/2021 0857  ? ALKPHOS 191 (H) 12/17/2021 0857  ? AST 37 12/17/2021 0857  ? ALT 24 12/17/2021 0857  ? BILITOT 0.6 12/17/2021 0857  ?  ? ? ? ?Impression and Plan: ?Pamela [name] is a very  charming 61 year old white female.  She presented with impending liver and kidney failure because of marked tumor involvement and marked hyperuricemia.   She subsequently was treated in the hospital.  She had a f

## 2021-12-17 NOTE — Patient Instructions (Signed)
[facility]  Discharge Instructions: ?Thank you for choosing [facility] to provide your oncology and hematology care.  ? ?If you have a lab appointment with the [facility], please go directly to the [facility] and check in at the registration area. ? ?Wear comfortable clothing and clothing appropriate for easy access to any Portacath or PICC line.  ? ?We strive to give you quality time with your provider. You may need to reschedule your appointment if you arrive late (15 or more minutes).  Arriving late affects you and other patients whose appointments are after yours.  Also, if you miss three or more appointments without notifying the office, you may be dismissed from the clinic at the provider?s discretion.    ?  ?For prescription refill requests, have your pharmacy contact our office and allow 72 hours for refills to be completed.   ? ?Today you received the following chemotherapy and/or immunotherapy agents Tecentriq, Carboplatin and Etoposide    ?  ?To help prevent nausea and vomiting after your treatment, we encourage you to take your nausea medication as directed. ? ?BELOW ARE SYMPTOMS THAT SHOULD BE REPORTED IMMEDIATELY: ?*FEVER GREATER THAN 100.4 F (38 ?C) OR HIGHER ?*CHILLS OR SWEATING ?*NAUSEA AND VOMITING THAT IS NOT CONTROLLED WITH YOUR NAUSEA MEDICATION ?*UNUSUAL SHORTNESS OF BREATH ?*UNUSUAL BRUISING OR BLEEDING ?*URINARY PROBLEMS (pain or burning when urinating, or frequent urination) ?*BOWEL PROBLEMS (unusual diarrhea, constipation, pain near the anus) ?TENDERNESS IN MOUTH AND THROAT WITH OR WITHOUT PRESENCE OF ULCERS (sore throat, sores in mouth, or a toothache) ?UNUSUAL RASH, SWELLING OR PAIN  ?UNUSUAL VAGINAL DISCHARGE OR ITCHING  ? ?Items with * indicate a potential emergency and should be followed up as soon as possible or go to the Emergency Department if any problems should occur. ? ?Please show the CHEMOTHERAPY ALERT CARD or IMMUNOTHERAPY  ALERT CARD at check-in to the Emergency Department and triage nurse. ?Should you have questions after your visit or need to cancel or reschedule your appointment, please contact [facility]  [phone] and follow the prompts.  Office hours are 8:00 a.m. to 4:30 p.m. Monday - Friday. Please note that voicemails left after 4:00 p.m. may not be returned until the following business day.  We are closed weekends and major holidays. You have access to a nurse at all times for urgent questions. Please call the main number to the clinic [phone] and follow the prompts. ? ?For any non-urgent questions, you may also contact your provider using MyChart. We now offer e-Visits for anyone 70 and older to request care online for non-urgent symptoms. For details visit [email or web address]. ?  ?Also download the MyChart app! Go to the app store, search "MyChart", open the app, select [facility], and log in with your MyChart username and password. ? ?Due to Covid, a mask is required upon entering the hospital/clinic. If you do not have a mask, one will be given to you upon arrival. For doctor visits, patients may have 1 support person aged 53 or older with them. For treatment visits, patients cannot have anyone with them due to current Covid guidelines and our immunocompromised population.  ?

## 2021-12-17 NOTE — Patient Instructions (Signed)

## 2021-12-18 ENCOUNTER — Ambulatory Visit: Payer: BC Managed Care – PPO

## 2021-12-18 ENCOUNTER — Inpatient Hospital Stay: Payer: BC Managed Care – PPO

## 2021-12-18 ENCOUNTER — Inpatient Hospital Stay: Payer: BC Managed Care – PPO | Admitting: Licensed Clinical Social Worker

## 2021-12-18 ENCOUNTER — Encounter: Payer: Self-pay | Admitting: *Deleted

## 2021-12-18 VITALS — BP 113/44 | HR 73 | Temp 98.0°F | Resp 16

## 2021-12-18 DIAGNOSIS — C3490 Malignant neoplasm of unspecified part of unspecified bronchus or lung: Secondary | ICD-10-CM

## 2021-12-18 DIAGNOSIS — E878 Other disorders of electrolyte and fluid balance, not elsewhere classified: Secondary | ICD-10-CM

## 2021-12-18 DIAGNOSIS — Z5111 Encounter for antineoplastic chemotherapy: Secondary | ICD-10-CM | POA: Diagnosis not present

## 2021-12-18 MED ORDER — SODIUM CHLORIDE 0.9 % IV SOLN
10.0000 mg | Freq: Once | INTRAVENOUS | Status: AC
  Administered 2021-12-18: 10 mg via INTRAVENOUS
  Filled 2021-12-18: qty 10

## 2021-12-18 MED ORDER — MAGNESIUM SULFATE 2 GM/50ML IV SOLN
2.0000 g | Freq: Once | INTRAVENOUS | Status: AC
  Administered 2021-12-18: 2 g via INTRAVENOUS
  Filled 2021-12-18: qty 50

## 2021-12-18 MED ORDER — HEPARIN SOD (PORK) LOCK FLUSH 100 UNIT/ML IV SOLN
500.0000 [IU] | Freq: Once | INTRAVENOUS | Status: AC | PRN
  Administered 2021-12-18: 500 [IU]

## 2021-12-18 MED ORDER — SODIUM CHLORIDE 0.9% FLUSH
10.0000 mL | INTRAVENOUS | Status: DC | PRN
  Administered 2021-12-18: 10 mL

## 2021-12-18 MED ORDER — SODIUM CHLORIDE 0.9 % IV SOLN
90.0000 mg/m2 | Freq: Once | INTRAVENOUS | Status: AC
  Administered 2021-12-18: 190 mg via INTRAVENOUS
  Filled 2021-12-18: qty 9.5

## 2021-12-18 MED ORDER — SODIUM CHLORIDE 0.9 % IV SOLN: Freq: Once | INTRAVENOUS | Status: AC

## 2021-12-18 NOTE — Patient Instructions (Signed)
[facility]  Discharge Instructions: ?Thank you for choosing [facility] to provide your oncology and hematology care.  ? ?If you have a lab appointment with the [facility], please go directly to the [facility] and check in at the registration area. ? ?Wear comfortable clothing and clothing appropriate for easy access to any Portacath or PICC line.  ? ?We strive to give you quality time with your provider. You may need to reschedule your appointment if you arrive late (15 or more minutes).  Arriving late affects you and other patients whose appointments are after yours.  Also, if you miss three or more appointments without notifying the office, you may be dismissed from the clinic at the provider?s discretion.    ?  ?For prescription refill requests, have your pharmacy contact our office and allow 72 hours for refills to be completed.   ? ?Today you received the following chemotherapy and/or immunotherapy agents Etoposide    ?  ?To help prevent nausea and vomiting after your treatment, we encourage you to take your nausea medication as directed. ? ?BELOW ARE SYMPTOMS THAT SHOULD BE REPORTED IMMEDIATELY: ?*FEVER GREATER THAN 100.4 F (38 ?C) OR HIGHER ?*CHILLS OR SWEATING ?*NAUSEA AND VOMITING THAT IS NOT CONTROLLED WITH YOUR NAUSEA MEDICATION ?*UNUSUAL SHORTNESS OF BREATH ?*UNUSUAL BRUISING OR BLEEDING ?*URINARY PROBLEMS (pain or burning when urinating, or frequent urination) ?*BOWEL PROBLEMS (unusual diarrhea, constipation, pain near the anus) ?TENDERNESS IN MOUTH AND THROAT WITH OR WITHOUT PRESENCE OF ULCERS (sore throat, sores in mouth, or a toothache) ?UNUSUAL RASH, SWELLING OR PAIN  ?UNUSUAL VAGINAL DISCHARGE OR ITCHING  ? ?Items with * indicate a potential emergency and should be followed up as soon as possible or go to the Emergency Department if any problems should occur. ? ?Please show the CHEMOTHERAPY ALERT CARD or IMMUNOTHERAPY ALERT CARD at check-in to the  Emergency Department and triage nurse. ?Should you have questions after your visit or need to cancel or reschedule your appointment, please contact [facility]  [phone] and follow the prompts.  Office hours are 8:00 a.m. to 4:30 p.m. Monday - Friday. Please note that voicemails left after 4:00 p.m. may not be returned until the following business day.  We are closed weekends and major holidays. You have access to a nurse at all times for urgent questions. Please call the main number to the clinic [phone] and follow the prompts. ? ?For any non-urgent questions, you may also contact your provider using MyChart. We now offer e-Visits for anyone 23 and older to request care online for non-urgent symptoms. For details visit [email or web address]. ?  ?Also download the MyChart app! Go to the app store, search "MyChart", open the app, select [facility], and log in with your MyChart username and password. ? ?Due to Covid, a mask is required upon entering the hospital/clinic. If you do not have a mask, one will be given to you upon arrival. For doctor visits, patients may have 1 support person aged 49 or older with them. For treatment visits, patients cannot have anyone with them due to current Covid guidelines and our immunocompromised population.  ?

## 2021-12-18 NOTE — Progress Notes (Signed)
Oncology Nurse Navigator Documentation ? ? ?  12/18/2021  ?  7:30 AM  ?Oncology Nurse Navigator Flowsheets  ?Navigator Follow Up Date: 01/02/2022  ?Navigator Follow Up Reason: Follow-up Appointment;Chemotherapy  ?Navigator Location [facility]  ?Navigator Encounter Type Appt/Treatment Plan Review  ?Patient Visit Type MedOnc  ?Treatment Phase Active Tx  ?Barriers/Navigation Needs Coordination of Care;Education  ?Interventions None Required  ?Acuity Level 2-Minimal Needs (1-2 Barriers Identified)  ?Support Groups/Services Friends and Family  ?Time Spent with Patient 15  ?  ?

## 2021-12-18 NOTE — Progress Notes (Signed)
Mg = 1. ?Magnesium 2gm IV today entered per Dr. Antonieta Pert instructions. ?

## 2021-12-18 NOTE — Progress Notes (Deleted)
REFERRING PROVIDER: ?[name], MD ?[address] ?[address] ?[place],  [place] [address] ? ?PRIMARY PROVIDER:  ?Marinda Elk, MD ? ?PRIMARY REASON FOR VISIT:  ?1. History of uterine cancer   ?2. History of colon cancer   ?3. Family history of breast cancer   ? ? ? ?HISTORY OF PRESENT ILLNESS:   ?Ms. Buser, a 61 y.o. female, was seen for a [facility] cancer genetics consultation at the request of Dr. Marin Olp due to a personal and family history of cancer.  Ms. Pippen presents to clinic today to discuss the possibility of a hereditary predisposition to cancer, genetic testing, and to further clarify her future cancer risks, as well as potential cancer risks for family members.   ? ?In 2023, at the age of 60, Ms. Lubbers was diagnosed with small cell lung cancer. Treatment plan includes chemotherapy.  ?At the age of 53, Ms. Brosch was diagnosed with uterine cancer and at the age of 28, she was diagnosed with colon cancer.  ? ?CANCER HISTORY:  ?Oncology History  ?Small cell lung cancer ([facility])  ?11/02/2021 Initial Diagnosis  ? Malignant neoplasm of unspecified part of unspecified bronchus or lung ([facility]) ?  ?11/03/2021 Cancer Staging  ? Staging form: Lung, AJCC 8th Edition ?- Clinical stage from 11/03/2021: Stage IVB (cT2, cN2, pM1c) - Signed by Sindy Guadeloupe, MD on 11/03/2021 ? ?  ?11/05/2021 - 11/07/2021 Chemotherapy  ? Patient is on Treatment Plan : LUNG SCLC Carboplatin + Etoposide + Atezolizumab Induction q21d / Atezolizumab Maintenance q21d  ?   ?11/26/2021 -  Chemotherapy  ? Patient is on Treatment Plan : LUNG SMALL CELL Carboplatin D1 / Etoposide D1-3 q21d  ?   ?11/26/2021 -  Chemotherapy  ? Patient is on Treatment Plan : LUNG NSCLC Atezolizumab q21d  ?   ? ? ? ?RISK FACTORS:  ?Menarche was at age ***.  ?First live birth at age ***.  ?OCP use for approximately {Numbers 1-12 multi-select:20307} years.  ?Ovaries intact: {Yes/No-Ex:120004}.  ?Hysterectomy: {Yes/No-Ex:120004}.  ?Menopausal status: {Menopause:31378}.   ?HRT use: {Numbers 1-12 multi-select:20307} years. ?Colonoscopy: {Yes/No-Ex:120004}; {normal/abnormal/not examined:14677}. ?Mammogram within the last year: {Yes/No-Ex:120004}. ?Number of breast biopsies: {Numbers 1-12 multi-select:20307}. ?Up to date with pelvic exams: {Yes/No-Ex:120004}. ?Any excessive radiation exposure in the past: {Yes/No-Ex:120004} ? ?Past Medical History:  ?Diagnosis Date  ? Colon cancer [facility]) 2003  ? Diabetes mellitus without complication ([facility])   ? Hypertension   ? Personal history of chemotherapy 2003  ? colon cancer  ? Uterine cancer [facility]) 2007  ? ? ?Past Surgical History:  ?Procedure Laterality Date  ? ABDOMINAL HYSTERECTOMY    ? BREAST EXCISIONAL BIOPSY Right 11/26/2010  ? neg/- PROLIFERATIVE FIBROCYSTIC CHANGE WITH ASSOCIATED   ? CHOLECYSTECTOMY    ? COLON SURGERY    ? 2003  ? COLONOSCOPY    ? COLONOSCOPY WITH PROPOFOL N/A 12/11/2018  ? Procedure: COLONOSCOPY WITH PROPOFOL;  Surgeon: Lollie Sails, MD;  Location: [facility] ENDOSCOPY;  Service: Endoscopy;  Laterality: N/A;  ? IR IMAGING GUIDED PORT INSERTION  11/20/2021  ? ? ?Social History  ? ?Socioeconomic History  ? Marital status: Married  ?  Spouse name: Not on file  ? Number of children: Not on file  ? Years of education: Not on file  ? Highest education level: Not on file  ?Occupational History  ? Not on file  ?Tobacco Use  ? Smoking status: Former  ?  Packs/day: 1.00  ?  Years: 43.00  ?  Pack  years: 43.00  ?  Types: Cigarettes  ?  Quit date: 10/17/2021  ?  Years since quitting: 0.1  ? Smokeless tobacco: Never  ? Tobacco comments:  ?  1 to 2 cigarettes per day now  ?Vaping Use  ? Vaping Use: Never used  ?Substance and Sexual Activity  ? Alcohol use: Not Currently  ? Drug use: Never  ? Sexual activity: Not on file  ?Other Topics Concern  ? Not on file  ?Social History Narrative  ? Not on file  ? ?Social Determinants of Health  ? ?Financial Resource Strain: Not on file  ?Food Insecurity: Not on file  ?Transportation Needs: Not on file   ?Physical Activity: Not on file  ?Stress: Not on file  ?Social Connections: Not on file  ?  ? ?FAMILY HISTORY:  ?We obtained a detailed, 4-generation family history.  Significant diagnoses are listed below: ?Family History  ?Problem Relation Age of Onset  ? Breast cancer Maternal Aunt 60  ? Breast cancer Paternal Aunt 37  ? Breast cancer Cousin 18  ? Breast cancer Cousin 73  ? ? ?Ms. Sakuma is {aware/unaware} of previous family history of genetic testing for hereditary cancer risks. Patient's maternal ancestors are of *** descent, and paternal ancestors are of *** descent. There is no reported Ashkenazi Jewish ancestry. There is no known consanguinity. ? ?GENETIC COUNSELING ASSESSMENT: Ms. Brabant is a 61 y.o. female with a personal and family history of cancer which is somewhat suggestive of a hereditary cancer syndrome and predisposition to cancer. We, therefore, discussed and recommended the following at today's visit.  ? ?DISCUSSION: We discussed that approximately 10% of colorectal cancer is hereditary. Most cases of hereditary colorectal/uterine cancer are associated with Lynch syndrome genes, although there are other genes associated with hereditary cancer as well including genes associated with breast cancer. Cancers and risks are gene specific.  We discussed that testing is beneficial for several reasons including knowing about other cancer risks, identifying potential screening and risk-reduction options that may be appropriate, and to understand if other family members could be at risk for cancer and allow them to undergo genetic testing.  ? ?We reviewed the characteristics, features and inheritance patterns of hereditary cancer syndromes. We also discussed genetic testing, including the appropriate family members to test, the process of testing, insurance coverage and turn-around-time for results. We discussed the implications of a negative, positive and/or variant of uncertain significant result. We  recommended Ms. Holzer pursue genetic testing for the [facility] Common Hereditary Cancers+RNA gene panel which she had blood drawn for on 11/26/2021. ? ?Based on Ms. Townshend's personal and family history of cancer, she meets medical criteria for genetic testing. Despite that she meets criteria, she may still have an out of pocket cost. We discussed that if her out of pocket cost for testing is over $100, the laboratory will call and confirm whether she wants to proceed with testing.  If the out of pocket cost of testing is less than $100 she will be billed by the genetic testing laboratory.  ? ?GENETIC TEST RESULTS: Genetic testing reported out on 12/13/2021 through the [facility] Common Hereditary Cancers+RNA cancer panel found no pathogenic mutations.  ? ?The Common Hereditary Cancers Panel + RNA offered by [facility] includes sequencing and/or deletion duplication testing of the following 47 genes: APC, ATM, AXIN2, BARD1, BMPR1A, BRCA1, BRCA2, BRIP1, CDH1, CDKN2A (p14ARF), CDKN2A (p16INK4a), CKD4, CHEK2, CTNNA1, DICER1, EPCAM (Deletion/duplication testing only), GREM1 (promoter region deletion/duplication testing only), KIT, MEN1, MLH1, MSH2, MSH3,  MSH6, MUTYH, NBN, NF1, NHTL1, PALB2, PDGFRA, PMS2, POLD1, POLE, PTEN, RAD50, RAD51C, RAD51D, SDHB, SDHC, SDHD, SMAD4, SMARCA4. STK11, TP53, TSC1, TSC2, and VHL.  The following genes were evaluated for sequence changes only: SDHA and HOXB13 c.251G>A variant only.  ? ?The test report has been scanned into EPIC and is located under the Molecular Pathology section of the Results Review tab.  A portion of the result report is included below for reference.  ? ? ? ?We discussed that because current genetic testing is not perfect, it is possible there may be a gene mutation in one of these genes that current testing cannot detect, but that chance is small.  There could be another gene that has not yet been discovered, or that we have not yet tested, that is responsible for the cancer  diagnoses in the family. It is also possible there is a hereditary cause for the cancer in the family that Ms. Flegel did not inherit and therefore was not identified in her testing.  Therefore, it is important to rem

## 2021-12-19 ENCOUNTER — Ambulatory Visit: Payer: BC Managed Care – PPO

## 2021-12-19 ENCOUNTER — Inpatient Hospital Stay: Payer: BC Managed Care – PPO

## 2021-12-19 VITALS — BP 122/52 | HR 67 | Temp 98.7°F | Resp 19

## 2021-12-19 DIAGNOSIS — E878 Other disorders of electrolyte and fluid balance, not elsewhere classified: Secondary | ICD-10-CM

## 2021-12-19 DIAGNOSIS — C3490 Malignant neoplasm of unspecified part of unspecified bronchus or lung: Secondary | ICD-10-CM

## 2021-12-19 DIAGNOSIS — Z5111 Encounter for antineoplastic chemotherapy: Secondary | ICD-10-CM | POA: Diagnosis not present

## 2021-12-19 MED ORDER — SODIUM CHLORIDE 0.9 % IV SOLN
90.0000 mg/m2 | Freq: Once | INTRAVENOUS | Status: AC
  Administered 2021-12-19: 190 mg via INTRAVENOUS
  Filled 2021-12-19: qty 9.5

## 2021-12-19 MED ORDER — SODIUM CHLORIDE 0.9 % IV SOLN
10.0000 mg | Freq: Once | INTRAVENOUS | Status: AC
  Administered 2021-12-19: 10 mg via INTRAVENOUS
  Filled 2021-12-19: qty 10

## 2021-12-19 MED ORDER — SODIUM CHLORIDE 0.9 % IV SOLN: Freq: Once | INTRAVENOUS | Status: AC

## 2021-12-19 MED ORDER — SODIUM CHLORIDE 0.9 % IV SOLN: Freq: Once | INTRAVENOUS | Status: DC

## 2021-12-19 MED ORDER — MAGNESIUM SULFATE 2 GM/50ML IV SOLN
2.0000 g | Freq: Once | INTRAVENOUS | Status: AC
  Administered 2021-12-19: 2 g via INTRAVENOUS
  Filled 2021-12-19: qty 50

## 2021-12-19 MED ORDER — HEPARIN SOD (PORK) LOCK FLUSH 100 UNIT/ML IV SOLN
500.0000 [IU] | Freq: Once | INTRAVENOUS | Status: AC | PRN
  Administered 2021-12-19: 500 [IU]

## 2021-12-19 MED ORDER — SODIUM CHLORIDE 0.9% FLUSH
10.0000 mL | INTRAVENOUS | Status: DC | PRN
  Administered 2021-12-19: 10 mL

## 2021-12-19 NOTE — Patient Instructions (Signed)
[facility]  Discharge Instructions: ?Thank you for choosing [facility] to provide your oncology and hematology care.  ? ?If you have a lab appointment with the [facility], please go directly to the [facility] and check in at the registration area. ? ?Wear comfortable clothing and clothing appropriate for easy access to any Portacath or PICC line.  ? ?We strive to give you quality time with your provider. You may need to reschedule your appointment if you arrive late (15 or more minutes).  Arriving late affects you and other patients whose appointments are after yours.  Also, if you miss three or more appointments without notifying the office, you may be dismissed from the clinic at the provider?s discretion.    ?  ?For prescription refill requests, have your pharmacy contact our office and allow 72 hours for refills to be completed.   ? ?Today you received the following chemotherapy and/or immunotherapy agents [id number]    ?  ?To help prevent nausea and vomiting after your treatment, we encourage you to take your nausea medication as directed. ? ?BELOW ARE SYMPTOMS THAT SHOULD BE REPORTED IMMEDIATELY: ?*FEVER GREATER THAN 100.4 F (38 ?C) OR HIGHER ?*CHILLS OR SWEATING ?*NAUSEA AND VOMITING THAT IS NOT CONTROLLED WITH YOUR NAUSEA MEDICATION ?*UNUSUAL SHORTNESS OF BREATH ?*UNUSUAL BRUISING OR BLEEDING ?*URINARY PROBLEMS (pain or burning when urinating, or frequent urination) ?*BOWEL PROBLEMS (unusual diarrhea, constipation, pain near the anus) ?TENDERNESS IN MOUTH AND THROAT WITH OR WITHOUT PRESENCE OF ULCERS (sore throat, sores in mouth, or a toothache) ?UNUSUAL RASH, SWELLING OR PAIN  ?UNUSUAL VAGINAL DISCHARGE OR ITCHING  ? ?Items with * indicate a potential emergency and should be followed up as soon as possible or go to the Emergency Department if any problems should occur. ? ?Please show the CHEMOTHERAPY ALERT CARD or IMMUNOTHERAPY ALERT CARD at check-in to the  Emergency Department and triage nurse. ?Should you have questions after your visit or need to cancel or reschedule your appointment, please contact [facility]  [phone] and follow the prompts.  Office hours are 8:00 a.m. to 4:30 p.m. Monday - Friday. Please note that voicemails left after 4:00 p.m. may not be returned until the following business day.  We are closed weekends and major holidays. You have access to a nurse at all times for urgent questions. Please call the main number to the clinic [phone] and follow the prompts. ? ?For any non-urgent questions, you may also contact your provider using MyChart. We now offer e-Visits for anyone 38 and older to request care online for non-urgent symptoms. For details visit [email or web address]. ?  ?Also download the MyChart app! Go to the app store, search "MyChart", open the app, select [facility], and log in with your MyChart username and password. ? ?Due to Covid, a mask is required upon entering the hospital/clinic. If you do not have a mask, one will be given to you upon arrival. For doctor visits, patients may have 1 support person aged 46 or older with them. For treatment visits, patients cannot have anyone with them due to current Covid guidelines and our immunocompromised population.  ?

## 2021-12-21 ENCOUNTER — Inpatient Hospital Stay: Payer: BC Managed Care – PPO

## 2021-12-21 DIAGNOSIS — Z5111 Encounter for antineoplastic chemotherapy: Secondary | ICD-10-CM | POA: Diagnosis not present

## 2021-12-21 DIAGNOSIS — C3490 Malignant neoplasm of unspecified part of unspecified bronchus or lung: Secondary | ICD-10-CM

## 2021-12-21 MED ORDER — PEGFILGRASTIM-CBQV 6 MG/0.6ML ~~LOC~~ SOSY
6.0000 mg | PREFILLED_SYRINGE | Freq: Once | SUBCUTANEOUS | Status: AC
  Administered 2021-12-21: 6 mg via SUBCUTANEOUS
  Filled 2021-12-21: qty 0.6

## 2022-01-02 ENCOUNTER — Inpatient Hospital Stay: Payer: BC Managed Care – PPO

## 2022-01-02 ENCOUNTER — Encounter: Payer: Self-pay | Admitting: *Deleted

## 2022-01-02 ENCOUNTER — Inpatient Hospital Stay: Payer: BC Managed Care – PPO | Admitting: Family

## 2022-01-02 ENCOUNTER — Other Ambulatory Visit: Payer: Self-pay | Admitting: *Deleted

## 2022-01-02 ENCOUNTER — Encounter: Payer: Self-pay | Admitting: Family

## 2022-01-02 VITALS — BP 113/39 | HR 75 | Temp 97.9°F | Resp 18 | Wt 187.0 lb

## 2022-01-02 DIAGNOSIS — D649 Anemia, unspecified: Secondary | ICD-10-CM | POA: Diagnosis not present

## 2022-01-02 DIAGNOSIS — Z5111 Encounter for antineoplastic chemotherapy: Secondary | ICD-10-CM | POA: Diagnosis not present

## 2022-01-02 DIAGNOSIS — D509 Iron deficiency anemia, unspecified: Secondary | ICD-10-CM

## 2022-01-02 DIAGNOSIS — R399 Unspecified symptoms and signs involving the genitourinary system: Secondary | ICD-10-CM | POA: Diagnosis not present

## 2022-01-02 DIAGNOSIS — C3490 Malignant neoplasm of unspecified part of unspecified bronchus or lung: Secondary | ICD-10-CM | POA: Diagnosis not present

## 2022-01-02 LAB — URINALYSIS, COMPLETE (UACMP) WITH MICROSCOPIC
Bilirubin Urine: NEGATIVE
Glucose, UA: NEGATIVE mg/dL
Hgb urine dipstick: NEGATIVE
Ketones, ur: NEGATIVE mg/dL
Leukocytes,Ua: NEGATIVE
Nitrite: NEGATIVE
Protein, ur: NEGATIVE mg/dL
Specific Gravity, Urine: 1.01 (ref 1.005–1.030)
pH: 5 (ref 5.0–8.0)

## 2022-01-02 LAB — CMP (CANCER CENTER ONLY)
ALT: 25 U/L (ref 0–44)
AST: 20 U/L (ref 15–41)
Albumin: 3.8 g/dL (ref 3.5–5.0)
Alkaline Phosphatase: 223 U/L — ABNORMAL HIGH (ref 38–126)
Anion gap: 11 (ref 5–15)
BUN: 14 mg/dL (ref 8–23)
CO2: 28 mmol/L (ref 22–32)
Calcium: 8.3 mg/dL — ABNORMAL LOW (ref 8.9–10.3)
Chloride: 98 mmol/L (ref 98–111)
Creatinine: 0.81 mg/dL (ref 0.44–1.00)
GFR, Estimated: >60 mL/min (ref >60)
Glucose, Bld: 300 mg/dL — ABNORMAL HIGH (ref 70–99)
Potassium: 3.7 mmol/L (ref 3.5–5.1)
Sodium: 137 mmol/L (ref 135–145)
Total Bilirubin: 0.4 mg/dL (ref 0.3–1.2)
Total Protein: 6.9 g/dL (ref 6.5–8.1)

## 2022-01-02 LAB — CBC WITH DIFFERENTIAL (CANCER CENTER ONLY)
Abs Immature Granulocytes: 0.05 10*3/uL (ref 0.00–0.07)
Basophils Absolute: 0 10*3/uL (ref 0.0–0.1)
Basophils Relative: 0 %
Eosinophils Absolute: 0 10*3/uL (ref 0.0–0.5)
Eosinophils Relative: 1 %
HCT: 23.6 % — ABNORMAL LOW (ref 36.0–46.0)
Hemoglobin: 7.8 g/dL — ABNORMAL LOW (ref 12.0–15.0)
Immature Granulocytes: 1 %
Lymphocytes Relative: 32 %
Lymphs Abs: 1.4 10*3/uL (ref 0.7–4.0)
MCH: 31.5 pg (ref 26.0–34.0)
MCHC: 33.1 g/dL (ref 30.0–36.0)
MCV: 95.2 fL (ref 80.0–100.0)
Monocytes Absolute: 0.5 10*3/uL (ref 0.1–1.0)
Monocytes Relative: 12 %
Neutro Abs: 2.3 10*3/uL (ref 1.7–7.7)
Neutrophils Relative %: 54 %
Platelet Count: 30 10*3/uL — ABNORMAL LOW (ref 150–400)
RBC: 2.48 MIL/uL — ABNORMAL LOW (ref 3.87–5.11)
RDW: 15.4 % (ref 11.5–15.5)
WBC Count: 4.2 10*3/uL (ref 4.0–10.5)
nRBC: 0 % (ref 0.0–0.2)

## 2022-01-02 LAB — SAMPLE TO BLOOD BANK

## 2022-01-02 LAB — LACTATE DEHYDROGENASE: LDH: 196 U/L — ABNORMAL HIGH (ref 98–192)

## 2022-01-02 LAB — PREPARE RBC (CROSSMATCH)

## 2022-01-02 NOTE — Progress Notes (Signed)
Patient is here for lab check before her next treatment next week. She is found to be anemic and will need to be transfused tomorrow.  ?

## 2022-01-02 NOTE — Progress Notes (Signed)
?Hematology and Oncology Follow Up Visit ? ?Pamela [name] ?[id number] ?1961/06/07 61 y.o. ?01/02/2022 ? ? ?Principle Diagnosis:  ?Extensive stage small cell lung cancer ?  ?Current Therapy:        ?Status post cycle #2 of carboplatinum/etoposide/Tecentriq -1st cycle given in the hospital on 11/05/2021 ?  ?Interim History:  Pamela [name] is here today with her daughter for follow-up and treatment. She notes some SOB when laying down and occasional twinges of chest discomfort on deep inhalation.  ?She denies fatigue.  ?No fever, chills, n/v, cough, rash, dizziness, palpitations, abdominal pain or changes in bowel habits.  ?She notes that her urine has been dark.  ?No obvious blood loss. No bruising or petechiae.  ?Swelling in her lower extremities continues to improve.  ?No numbness or tingling at this time.  ?No falls or syncope.  ?She states that her appetite is good and she is staying well hydrated. Her weight is stable at 187 lbs.  ? ?ECOG Performance Status: 1 - Symptomatic but completely ambulatory ? ?Medications:  ?Allergies as of 01/02/2022   ? ?   Reactions  ? Rofecoxib Swelling  ? ?  ? ?  ?Medication List  ?  ? ?  ? Accurate as of January 02, 2022 10:25 AM. If you have any questions, ask your nurse or doctor.  ?  ?  ? ?  ? ?albuterol 108 (90 Base) MCG/ACT inhaler ?Commonly known as: VENTOLIN HFA ?2 puffs every 6 (six) hours as needed for wheezing or shortness of breath. ?  ?ciprofloxacin 500 MG tablet ?Commonly known as: Cipro ?Take 1 tablet (500 mg total) by mouth daily with breakfast. ?  ?dexamethasone 4 MG tablet ?Commonly known as: DECADRON ?Take 2 tablets (8 mg total) by mouth daily. Start the day after chemotherapy for 1 day. ?  ?esomeprazole 20 MG capsule ?Commonly known as: [place] ?Take 20 mg by mouth daily at 12 noon. ?  ?fentaNYL 12 MCG/HR ?Commonly known as: [place] ?Place 1 patch onto the skin every 3 (three) days. ?  ?furosemide 40 MG tablet ?Commonly known as: Lasix ?Take 1 tablet (40 mg total) by mouth  daily. ?  ?hydrOXYzine 10 MG tablet ?Commonly known as: ATARAX ?Take 1 tablet (10 mg total) by mouth 3 (three) times daily as needed. ?  ?levothyroxine 125 MCG tablet ?Commonly known as: SYNTHROID ?Take 0.5 tablets (62.5 mcg total) by mouth daily before breakfast. ?  ?LORazepam 0.5 MG tablet ?Commonly known as: Ativan ?Take 1 tablet (0.5 mg total) by mouth every 6 (six) hours as needed (Nausea or vomiting). ?  ?multivitamin tablet ?Take 1 tablet by mouth daily. ?  ?naloxone 4 MG/0.1ML Liqd nasal spray kit ?Commonly known as: NARCAN ?1 spray as needed (overdose). ?  ?ondansetron 8 MG tablet ?Commonly known as: Zofran ?Take 1 tablet (8 mg total) by mouth 2 (two) times daily as needed for refractory nausea / vomiting. Start on day 3 after carboplatin chemo. ?  ?Oxycodone HCl 10 MG Tabs ?Take 1 tablet (10 mg total) by mouth every 3 (three) hours as needed (breakthrough pain). ?  ?potassium chloride SA 20 MEQ tablet ?Commonly known as: KLOR-CON M ?Take 2 tablets (40 mEq total) by mouth 2 (two) times daily. ?  ?prochlorperazine 10 MG tablet ?Commonly known as: COMPAZINE ?Take 1 tablet (10 mg total) by mouth every 6 (six) hours as needed (Nausea or vomiting). ?  ?senna-docusate 8.6-50 MG tablet ?Commonly known as: Senokot-S ?Take 1 tablet by mouth 2 (two) times daily. ?  ?  silver sulfADIAZINE 1 % cream ?Commonly known as: Silvadene ?Apply Silvadene to bilat legs Q day, then cover with ABD pads and apply kerlex and an ace wrap in a spiral fashion ?  ?simvastatin 40 MG tablet ?Commonly known as: ZOCOR ?Take 40 mg by mouth every evening. ?  ?sitaGLIPtin-metformin 50-500 MG tablet ?Commonly known as: JANUMET ?Take 1 tablet by mouth 2 (two) times daily with a meal. ?  ? ?  ? ? ?Allergies:  ?Allergies  ?Allergen Reactions  ? Rofecoxib Swelling  ? ? ?Past Medical History, Surgical history, Social history, and Family History were reviewed and updated. ? ?Review of Systems: ?All other 10 point review of systems is negative.   ? ?Physical Exam: ? weight is 187 lb 0.6 oz (84.8 kg). Her oral temperature is 97.9 ?F (36.6 ?C). Her blood pressure is 113/39 (abnormal) and her pulse is 75. Her respiration is 18 and oxygen saturation is 99%.  ? ?Wt Readings from Last 3 Encounters:  ?01/02/22 187 lb 0.6 oz (84.8 kg)  ?12/17/21 186 lb (84.4 kg)  ?12/07/21 186 lb (84.4 kg)  ? ? ?Ocular: Sclerae unicteric, pupils equal, round and reactive to light ?Ear-nose-throat: Oropharynx clear, dentition fair ?Lymphatic: No cervical or supraclavicular adenopathy ?Lungs no rales or rhonchi, good excursion bilaterally ?Heart regular rate and rhythm, no murmur appreciated ?Abd soft, nontender, positive bowel sounds ?MSK no focal spinal tenderness, no joint edema ?Neuro: non-focal, well-oriented, appropriate affect ?Breasts: Deferred  ? ?Lab Results  ?Component Value Date  ? WBC 4.2 01/02/2022  ? HGB 7.8 (L) 01/02/2022  ? HCT 23.6 (L) 01/02/2022  ? MCV 95.2 01/02/2022  ? PLT 30 (L) 01/02/2022  ? ?Lab Results  ?Component Value Date  ? FERRITIN 155 10/22/2021  ? IRON 56 10/22/2021  ? TIBC 378 10/22/2021  ? UIBC 322 10/22/2021  ? IRONPCTSAT 15 10/22/2021  ? ?Lab Results  ?Component Value Date  ? RBC 2.48 (L) 01/02/2022  ? ?No results found for: KPAFRELGTCHN, LAMBDASER, KAPLAMBRATIO ?No results found for: IGGSERUM, IGA, IGMSERUM ?No results found for: TOTALPROTELP, ALBUMINELP, A1GS, A2GS, BETS, BETA2SER, GAMS, MSPIKE, SPEI ?  Chemistry   ?   ?Component Value Date/Time  ? NA 139 12/17/2021 0857  ? K 4.0 12/17/2021 0857  ? CL 101 12/17/2021 0857  ? CO2 27 12/17/2021 0857  ? BUN 10 12/17/2021 0857  ? CREATININE 0.80 12/17/2021 0857  ?    ?Component Value Date/Time  ? CALCIUM 8.3 (L) 12/17/2021 0857  ? ALKPHOS 191 (H) 12/17/2021 0857  ? AST 37 12/17/2021 0857  ? ALT 24 12/17/2021 0857  ? BILITOT 0.6 12/17/2021 0857  ?  ? ? ? ?Impression and Plan: Pamela [name] is a very pleasant 61 yo caucasian female with extensive stage small cell lung cancer.  ?Hgb has dropped to 7.8 and  platelets are 30. WBC count is stable at 4.2.  ?We will hold treatment today and give her 2 units of blood tomorrow.  ?UA and culture sent. She will also bring in a stool specimen tomorrow for hemoccult testing.  ?We will follow-up in 1 week and hopefully be able to resume treatment at that time.  ? ?Lottie Dawson, NP ?4/19/202310:25 AM ? ?

## 2022-01-02 NOTE — Patient Instructions (Signed)

## 2022-01-03 ENCOUNTER — Encounter: Payer: Self-pay | Admitting: Hematology & Oncology

## 2022-01-03 ENCOUNTER — Encounter: Payer: Self-pay | Admitting: *Deleted

## 2022-01-03 ENCOUNTER — Inpatient Hospital Stay: Payer: BC Managed Care – PPO

## 2022-01-03 VITALS — BP 122/39 | HR 83 | Temp 97.6°F | Resp 17

## 2022-01-03 DIAGNOSIS — D649 Anemia, unspecified: Secondary | ICD-10-CM

## 2022-01-03 DIAGNOSIS — D509 Iron deficiency anemia, unspecified: Secondary | ICD-10-CM

## 2022-01-03 DIAGNOSIS — Z5111 Encounter for antineoplastic chemotherapy: Secondary | ICD-10-CM | POA: Diagnosis not present

## 2022-01-03 DIAGNOSIS — C3490 Malignant neoplasm of unspecified part of unspecified bronchus or lung: Secondary | ICD-10-CM

## 2022-01-03 LAB — URINE CULTURE

## 2022-01-03 MED ORDER — SODIUM CHLORIDE 0.9% FLUSH
10.0000 mL | INTRAVENOUS | Status: AC | PRN
  Administered 2022-01-03: 10 mL

## 2022-01-03 MED ORDER — HEPARIN SOD (PORK) LOCK FLUSH 100 UNIT/ML IV SOLN
500.0000 [IU] | Freq: Every day | INTRAVENOUS | Status: AC | PRN
  Administered 2022-01-03: 500 [IU]

## 2022-01-03 MED ORDER — DIPHENHYDRAMINE HCL 25 MG PO CAPS
25.0000 mg | ORAL_CAPSULE | Freq: Once | ORAL | Status: AC
  Administered 2022-01-03: 25 mg via ORAL
  Filled 2022-01-03: qty 1

## 2022-01-03 MED ORDER — ACETAMINOPHEN 325 MG PO TABS
650.0000 mg | ORAL_TABLET | Freq: Once | ORAL | Status: AC
  Administered 2022-01-03: 650 mg via ORAL
  Filled 2022-01-03: qty 2

## 2022-01-03 MED ORDER — SODIUM CHLORIDE 0.9% IV SOLUTION
250.0000 mL | Freq: Once | INTRAVENOUS | Status: AC
  Administered 2022-01-03: 250 mL via INTRAVENOUS

## 2022-01-03 MED ORDER — SODIUM CHLORIDE 0.9% FLUSH: 3.0000 mL | INTRAVENOUS | Status: DC | PRN

## 2022-01-03 MED ORDER — HEPARIN SOD (PORK) LOCK FLUSH 100 UNIT/ML IV SOLN: 250.0000 [IU] | INTRAVENOUS | Status: DC | PRN

## 2022-01-03 NOTE — Progress Notes (Signed)
Patient getting transfused today. She mentions that she applied for disability but was denied due to her husband making too much money. She was instead granted early retirement. She asks if this sounds correct, or did maybe she do something wrong.  ? ?Contacted social work. She confirmed that patient's husband's income should not play a part, but that disability only pays until you're 61 and then it converts to retirement. They likely deferred to retirement as she will be 49 in July.  ? ?Her stool specimen was heme positive and patient needs GI workup prior to next treatment. She has a GI physician at the [facility] in [place]. Dr Marin Olp will speak to a physician there today to get patient seen urgently.  ? ?Oncology Nurse Navigator Documentation ? ? ?  01/03/2022  ?  8:45 AM  ?Oncology Nurse Navigator Flowsheets  ?Navigator Follow Up Date: 01/15/2022  ?Navigator Follow Up Reason: Surgery  ?Navigator Location [facility]  ?Navigator Encounter Type Treatment  ?Patient Visit Type MedOnc  ?Treatment Phase Active Tx  ?Barriers/Navigation Needs Coordination of Care;Education  ?Education Other  ?Interventions Disability/FMLA;Psycho-Social Support  ?Acuity Level 2-Minimal Needs (1-2 Barriers Identified)  ?Education Method Verbal  ?Support Groups/Services Friends and Family  ?Time Spent with Patient 30  ?  ?

## 2022-01-03 NOTE — Patient Instructions (Signed)
Blood Transfusion, Adult A blood transfusion is a procedure in which you receive blood or a type of blood cell (blood component) through an IV. You may need a blood transfusion when your blood level is low. This may result from a bleeding disorder, illness, injury, or surgery. The blood may come from a donor. You may also be able to donate blood for yourself (autologous blood donation) before a planned surgery. The blood given in a transfusion is made up of different blood components. You may receive: Red blood cells. These carry oxygen to the cells in the body. Platelets. These help your blood to clot. Plasma. This is the liquid part of your blood. It carries proteins and other substances throughout the body. White blood cells. These help you fight infections. If you have hemophilia or another clotting disorder, you may also receive other types of blood products. Tell a health care provider about: Any blood disorders you have. Any previous reactions you have had during a blood transfusion. Any allergies you have. All medicines you are taking, including vitamins, herbs, eye drops, creams, and over-the-counter medicines. Any surgeries you have had. Any medical conditions you have, including any recent fever or cold symptoms. Whether you are pregnant or may be pregnant. What are the risks? Generally, this is a safe procedure. However, problems may occur. The most common problems include: A mild allergic reaction, such as red, swollen areas of skin (hives) and itching. Fever or chills. This may be the body's response to new blood cells received. This may occur during or up to 4 hours after the transfusion. More serious problems may include: Transfusion-associated circulatory overload (TACO), or too much fluid in the lungs. This may cause breathing problems. A serious allergic reaction, such as difficulty breathing or swelling around the face and lips. Transfusion-related acute lung injury  (TRALI), which causes breathing difficulty and low oxygen in the blood. This can occur within hours of the transfusion or several days later. Iron overload. This can happen after receiving many blood transfusions over a period of time. Infection or virus being transmitted. This is rare because donated blood is carefully tested before it is given. Hemolytic transfusion reaction. This is rare. It happens when your body's defense system (immune system)tries to attack the new blood cells. Symptoms may include fever, chills, nausea, low blood pressure, and low back or chest pain. Transfusion-associated graft-versus-host disease (TAGVHD). This is rare. It happens when donated cells attack your body's healthy tissues. What happens before the procedure? Medicines Ask your health care provider about: Changing or stopping your regular medicines. This is especially important if you are taking diabetes medicines or blood thinners. Taking medicines such as aspirin and ibuprofen. These medicines can thin your blood. Do not take these medicines unless your health care provider tells you to take them. Taking over-the-counter medicines, vitamins, herbs, and supplements. General instructions Follow instructions from your health care provider about eating and drinking restrictions. You will have a blood test to determine your blood type. This is necessary to know what kind of blood your body will accept and to match it to the donor blood. If you are going to have a planned surgery, you may be able to do an autologous blood donation. This may be done in case you need to have a transfusion. You will have your temperature, blood pressure, and pulse monitored before the transfusion. If you have had an allergic reaction to a transfusion in the past, you may be given medicine to help prevent   a reaction. This medicine may be given to you by mouth (orally) or through an IV. Set aside time for the blood transfusion. This  procedure generally takes 1-4 hours to complete. What happens during the procedure?  An IV will be inserted into one of your veins. The bag of donated blood will be attached to your IV. The blood will then enter through your vein. Your temperature, blood pressure, and pulse will be monitored regularly during the transfusion. This monitoring is done to detect early signs of a transfusion reaction. Tell your nurse right away if you have any of these symptoms during the transfusion: Shortness of breath or trouble breathing. Chest or back pain. Fever or chills. Hives or itching. If you have any signs or symptoms of a reaction, your transfusion will be stopped and you may be given medicine. When the transfusion is complete, your IV will be removed. Pressure may be applied to the IV site for a few minutes. A bandage (dressing)will be applied. The procedure may vary among health care providers and hospitals. What happens after the procedure? Your temperature, blood pressure, pulse, breathing rate, and blood oxygen level will be monitored until you leave the hospital or clinic. Your blood may be tested to see how you are responding to the transfusion. You may be warmed with fluids or blankets to maintain a normal body temperature. If you receive your blood transfusion in an outpatient setting, you will be told whom to contact to report any reactions. Where to find more information For more information on blood transfusions, visit the American Red Cross: redcross.org Summary A blood transfusion is a procedure in which you receive blood or a type of blood cell (blood component) through an IV. The blood you receive may come from a donor or be donated by yourself (autologous blood donation) before a planned surgery. The blood given in a transfusion is made up of different blood components. You may receive red blood cells, platelets, plasma, or white blood cells depending on the condition treated. Your  temperature, blood pressure, and pulse will be monitored before, during, and after the transfusion. After the transfusion, your blood may be tested to see how your body has responded. This information is not intended to replace advice given to you by your health care provider. Make sure you discuss any questions you have with your health care provider. Document Revised: 07/08/2019 Document Reviewed: 02/25/2019 Elsevier Patient Education  2023 Elsevier Inc.  

## 2022-01-04 ENCOUNTER — Telehealth: Payer: Self-pay

## 2022-01-04 ENCOUNTER — Telehealth: Payer: Self-pay | Admitting: *Deleted

## 2022-01-04 LAB — BPAM RBC
Blood Product Expiration Date: 202305222359
Blood Product Expiration Date: 202305222359
ISSUE DATE / TIME: 202304200800
ISSUE DATE / TIME: 202304200800
Unit Type and Rh: 5100
Unit Type and Rh: 5100

## 2022-01-04 LAB — TYPE AND SCREEN
ABO/RH(D): O POS
Antibody Screen: NEGATIVE
Unit division: 0
Unit division: 0

## 2022-01-04 LAB — BPAM PLATELET PHERESIS
Blood Product Expiration Date: 202304222359
ISSUE DATE / TIME: 202304200901
Unit Type and Rh: 6200

## 2022-01-04 LAB — PREPARE PLATELET PHERESIS: Unit division: 0

## 2022-01-04 NOTE — Telephone Encounter (Signed)
Spoke with pt who states she lives an hour away and cannot come in for urine sample today. Spoke with Judson Roch and she said the first test was fine and second was just contaminated so she may be fine. We can just recheck her urine at her next apt. Pt advised and agreed to plan.  ?

## 2022-01-04 NOTE — Telephone Encounter (Signed)
Faxed referral to Dr. Ricky Stabs office @ [phone] ?

## 2022-01-04 NOTE — Telephone Encounter (Signed)
-----   Message from Celso Amy, NP sent at 01/04/2022 11:41 AM EDT ----- ?Looks contaminated. Can we recollect.  ? ?----- Message ----- ?From: Interface, Lab In [facility] ?Sent: 01/02/2022  11:43 AM EDT ?To: Celso Amy, NP ? ? ?

## 2022-01-09 ENCOUNTER — Other Ambulatory Visit: Payer: Self-pay | Admitting: Family

## 2022-01-09 DIAGNOSIS — C3490 Malignant neoplasm of unspecified part of unspecified bronchus or lung: Secondary | ICD-10-CM

## 2022-01-10 ENCOUNTER — Encounter: Payer: Self-pay | Admitting: Hematology & Oncology

## 2022-01-11 ENCOUNTER — Other Ambulatory Visit: Payer: BC Managed Care – PPO

## 2022-01-11 ENCOUNTER — Ambulatory Visit: Payer: BC Managed Care – PPO

## 2022-01-11 ENCOUNTER — Ambulatory Visit: Payer: BC Managed Care – PPO | Admitting: Family

## 2022-01-15 ENCOUNTER — Encounter: Payer: Self-pay | Admitting: *Deleted

## 2022-01-15 ENCOUNTER — Ambulatory Visit: Payer: BC Managed Care – PPO | Admitting: Anesthesiology

## 2022-01-15 ENCOUNTER — Ambulatory Visit
Admission: RE | Admit: 2022-01-15 | Discharge: 2022-01-15 | Disposition: A | Discharge status: Home / Self Care | Payer: BC Managed Care – PPO | Attending: Gastroenterology | Admitting: Gastroenterology

## 2022-01-15 ENCOUNTER — Encounter
Admission: RE | Disposition: A | Discharge status: Home / Self Care | Payer: Self-pay | Source: Home / Self Care | Attending: Gastroenterology

## 2022-01-15 DIAGNOSIS — E119 Type 2 diabetes mellitus without complications: Secondary | ICD-10-CM | POA: Diagnosis not present

## 2022-01-15 DIAGNOSIS — I10 Essential (primary) hypertension: Secondary | ICD-10-CM | POA: Diagnosis not present

## 2022-01-15 DIAGNOSIS — Z87891 Personal history of nicotine dependence: Secondary | ICD-10-CM | POA: Insufficient documentation

## 2022-01-15 DIAGNOSIS — Z8711 Personal history of peptic ulcer disease: Secondary | ICD-10-CM | POA: Diagnosis present

## 2022-01-15 DIAGNOSIS — D649 Anemia, unspecified: Secondary | ICD-10-CM | POA: Insufficient documentation

## 2022-01-15 DIAGNOSIS — M1990 Unspecified osteoarthritis, unspecified site: Secondary | ICD-10-CM | POA: Insufficient documentation

## 2022-01-15 DIAGNOSIS — Z85038 Personal history of other malignant neoplasm of large intestine: Secondary | ICD-10-CM | POA: Insufficient documentation

## 2022-01-15 DIAGNOSIS — Z7984 Long term (current) use of oral hypoglycemic drugs: Secondary | ICD-10-CM | POA: Diagnosis not present

## 2022-01-15 DIAGNOSIS — R195 Other fecal abnormalities: Secondary | ICD-10-CM | POA: Insufficient documentation

## 2022-01-15 DIAGNOSIS — C3490 Malignant neoplasm of unspecified part of unspecified bronchus or lung: Secondary | ICD-10-CM | POA: Insufficient documentation

## 2022-01-15 DIAGNOSIS — Z8542 Personal history of malignant neoplasm of other parts of uterus: Secondary | ICD-10-CM | POA: Insufficient documentation

## 2022-01-15 DIAGNOSIS — K449 Diaphragmatic hernia without obstruction or gangrene: Secondary | ICD-10-CM | POA: Insufficient documentation

## 2022-01-15 HISTORY — PX: ESOPHAGOGASTRODUODENOSCOPY (EGD) WITH PROPOFOL: SHX5813

## 2022-01-15 LAB — GLUCOSE, CAPILLARY: Glucose-Capillary: 154 mg/dL — ABNORMAL HIGH (ref 70–99)

## 2022-01-15 SURGERY — ESOPHAGOGASTRODUODENOSCOPY (EGD) WITH PROPOFOL
Anesthesia: General

## 2022-01-15 MED ORDER — LIDOCAINE HCL (CARDIAC) PF 100 MG/5ML IV SOSY
PREFILLED_SYRINGE | INTRAVENOUS | Status: DC | PRN
  Administered 2022-01-15: 80 mg via INTRAVENOUS

## 2022-01-15 MED ORDER — PROPOFOL 10 MG/ML IV BOLUS
INTRAVENOUS | Status: DC | PRN
  Administered 2022-01-15: 20 mg via INTRAVENOUS
  Administered 2022-01-15: 60 mg via INTRAVENOUS
  Administered 2022-01-15 (×3): 20 mg via INTRAVENOUS

## 2022-01-15 MED ORDER — DEXMEDETOMIDINE (PRECEDEX) IN NS 20 MCG/5ML (4 MCG/ML) IV SYRINGE
PREFILLED_SYRINGE | INTRAVENOUS | Status: DC | PRN
  Administered 2022-01-15: 4 ug via INTRAVENOUS
  Administered 2022-01-15: 8 ug via INTRAVENOUS

## 2022-01-15 MED ORDER — SODIUM CHLORIDE 0.9 % IV SOLN: INTRAVENOUS | Status: DC

## 2022-01-15 NOTE — Interval H&P Note (Signed)
History and Physical Interval Note: ? ?01/15/2022 ?11:28 AM ? ?Pamela [name]  has presented today for surgery, with the diagnosis of Heme(+) stool. acute blood loss.  The various methods of treatment have been discussed with the patient and family. After consideration of risks, benefits and other options for treatment, the patient has consented to  Procedure(s) with comments: ?ESOPHAGOGASTRODUODENOSCOPY (EGD) WITH PROPOFOL (N/A) - DM as a surgical intervention.  The patient's history has been reviewed, patient examined, no change in status, stable for surgery.  I have reviewed the patient's chart and labs.  Questions were answered to the patient's satisfaction.   ? ? ?Hilton Cork Maezie Justin ? ?Ok to proceed with EGD ?

## 2022-01-15 NOTE — Anesthesia Postprocedure Evaluation (Signed)
Anesthesia Post Note ? ?Patient: Pamela [name] ? ?Procedure(s) Performed: ESOPHAGOGASTRODUODENOSCOPY (EGD) WITH PROPOFOL ? ?Patient location during evaluation: Endoscopy ?Anesthesia Type: General ?Level of consciousness: awake and alert ?Pain management: pain level controlled ?Vital Signs Assessment: post-procedure vital signs reviewed and stable ?Respiratory status: spontaneous breathing, nonlabored ventilation, respiratory function stable and patient connected to nasal cannula oxygen ?Cardiovascular status: blood pressure returned to baseline and stable ?Postop Assessment: no apparent nausea or vomiting ?Anesthetic complications: no ? ? ?No notable events documented. ? ? ?Last Vitals:  ?Vitals:  ? 01/15/22 1141 01/15/22 1151  ?BP: 116/64 123/64  ?Pulse: 79 77  ?Resp: (!) 30 (!) 28  ?Temp: (!) 35.9 ?C   ?SpO2: 93% 92%  ?  ?Last Pain:  ?Vitals:  ? 01/15/22 1151  ?TempSrc:   ?PainSc: 0-No pain  ? ? ?  ?  ?  ?  ?  ?  ? ?Precious Haws Lovetta Condie ? ? ? ? ?

## 2022-01-15 NOTE — Transfer of Care (Signed)
Immediate Anesthesia Transfer of Care Note ? ?Patient: Pamela [name] ? ?Procedure(s) Performed: ESOPHAGOGASTRODUODENOSCOPY (EGD) WITH PROPOFOL ? ?Patient Location: PACU ? ?Anesthesia Type:General ? ?Level of Consciousness: awake, alert  and oriented ? ?Airway & Oxygen Therapy: Patient Spontanous Breathing ? ?Post-op Assessment: Report given to RN and Post -op Vital signs reviewed and stable ? ?Post vital signs: Reviewed and stable ? ?Last Vitals:  ?Vitals Value Taken Time  ?BP 116/64 01/15/22 1141  ?Temp    ?Pulse 80 01/15/22 1141  ?Resp 28 01/15/22 1141  ?SpO2 93 % 01/15/22 1141  ?Vitals shown include unvalidated device data. ? ?Last Pain:  ?Vitals:  ? 01/15/22 1108  ?TempSrc: Temporal  ?PainSc: 0-No pain  ?   ? ?  ? ?Complications: No notable events documented. ?

## 2022-01-15 NOTE — Op Note (Signed)
[facility] ?Gastroenterology ?Patient Name: Pamela [name] ?Procedure Date: 01/15/2022 11:02 AM ?MRN: [id number] ?Account #: [id number] ?Date of Birth: 23-Oct-1960 ?Admit Type: Outpatient ?Age: 61 ?Room: [facility] ENDO ROOM 1 ?Gender: Female ?Note Status: Finalized ?Instrument Name: Upper-Endoscope [id number] ?Procedure:             Upper GI endoscopy ?Indications:           Heme positive stool, Personal history of peptic ulcer  ?                       disease ?Providers:             Andrey Farmer MD, MD ?Referring MD:          Precious Bard, MD (Referring MD) ?Medicines:             Monitored Anesthesia Care ?Complications:         No immediate complications. ?Procedure:             Pre-Anesthesia Assessment: ?                       - Prior to the procedure, a History and Physical was  ?                       performed, and patient medications and allergies were  ?                       reviewed. The patient is competent. The risks and  ?                       benefits of the procedure and the sedation options and  ?                       risks were discussed with the patient. All questions  ?                       were answered and informed consent was obtained.  ?                       Patient identification and proposed procedure were  ?                       verified by the physician, the nurse, the  ?                       anesthesiologist, the anesthetist and the technician  ?                       in the endoscopy suite. Mental Status Examination:  ?                       alert and oriented. Airway Examination: normal  ?                       oropharyngeal airway and neck mobility. Respiratory  ?                       Examination: clear to auscultation. CV Examination:  ?  normal. Prophylactic Antibiotics: The patient does not  ?                       require prophylactic antibiotics. Prior  ?                       Anticoagulants: The patient has taken no previous  ?                        anticoagulant or antiplatelet agents. ASA Grade  ?                       Assessment: III - A patient with severe systemic  ?                       disease. After reviewing the risks and benefits, the  ?                       patient was deemed in satisfactory condition to  ?                       undergo the procedure. The anesthesia plan was to use  ?                       monitored anesthesia care (MAC). Immediately prior to  ?                       administration of medications, the patient was  ?                       re-assessed for adequacy to receive sedatives. The  ?                       heart rate, respiratory rate, oxygen saturations,  ?                       blood pressure, adequacy of pulmonary ventilation, and  ?                       response to care were monitored throughout the  ?                       procedure. The physical status of the patient was  ?                       re-assessed after the procedure. ?                       After obtaining informed consent, the endoscope was  ?                       passed under direct vision. Throughout the procedure,  ?                       the patient's blood pressure, pulse, and oxygen  ?                       saturations were monitored continuously. The Endoscope  ?  was introduced through the mouth, and advanced to the  ?                       second part of duodenum. The upper GI endoscopy was  ?                       accomplished without difficulty. The patient tolerated  ?                       the procedure well. ?Findings: ?     A small hiatal hernia was present. ?     The exam of the esophagus was otherwise normal. ?     The entire examined stomach was normal. ?     The examined duodenum was normal. ?Impression:            - Small hiatal hernia. ?                       - Normal stomach. ?                       - Normal examined duodenum. ?                       - No specimens collected. ?Recommendation:         - Discharge patient to home. ?                       - Resume previous diet. ?                       - Continue present medications. ?                       - Return to referring physician as previously  ?                       scheduled. ?Procedure Code(s):     --- Professional --- ?                       [phone], Esophagogastroduodenoscopy, flexible,  ?                       transoral; diagnostic, including collection of  ?                       specimen(s) by brushing or washing, when performed  ?                       (separate procedure) ?Diagnosis Code(s):     --- Professional --- ?                       K44.9, Diaphragmatic hernia without obstruction or  ?                       gangrene ?                       R19.5, Other fecal abnormalities ?                       Z87.11, Personal  history of peptic ulcer disease ?CPT copyright 2019 [employer]. All rights reserved. ?The codes documented in this report are preliminary and upon coder review may  ?be revised to meet current compliance requirements. ?Andrey Farmer MD, MD ?01/15/2022 11:41:21 AM ?Number of Addenda: 0 ?Note Initiated On: 01/15/2022 11:02 AM ?Estimated Blood Loss:  Estimated blood loss: none. ?     [facility] ?

## 2022-01-15 NOTE — H&P (Signed)
Outpatient short stay form Pre-procedure ?01/15/2022  ?Lesly Rubenstein, MD ? ?Primary Physician: Marinda Elk, MD ? ?Reason for visit:  Anemia ? ?History of present illness:   ? ?61 y/o lady with metastatic lung cancer here for EGD for anemia in the setting of chemotherapy. Oncologist was concerned due to her history of PUD. No blood thinners. Has personal history of colon cancer. ? ? ? ?Current Facility-Administered Medications:  ?  0.9 %  sodium chloride infusion, , Intravenous, Continuous, Jie Stickels, Hilton Cork, MD, Last Rate: 20 mL/hr at 01/15/22 1122, New Bag at 01/15/22 1122 ? ?Medications Prior to Admission  ?Medication Sig Dispense Refill Last Dose  ? dexamethasone (DECADRON) 4 MG tablet Take 2 tablets (8 mg total) by mouth daily. Start the day after chemotherapy for 1 day. 30 tablet 1 01/14/2022  ? diclofenac Sodium (VOLTAREN) 1 % GEL Apply topically 4 (four) times daily.   01/14/2022  ? esomeprazole (NEXIUM) 20 MG capsule Take 20 mg by mouth daily at 12 noon.   01/14/2022  ? furosemide (LASIX) 40 MG tablet Take 1 tablet (40 mg total) by mouth daily. 30 tablet 11 01/14/2022  ? hydrOXYzine (ATARAX) 10 MG tablet Take 1 tablet (10 mg total) by mouth 3 (three) times daily as needed. 30 tablet 0 01/14/2022  ? levothyroxine (SYNTHROID) 125 MCG tablet Take 0.5 tablets (62.5 mcg total) by mouth daily before breakfast. 15 tablet 1 01/14/2022  ? LORazepam (ATIVAN) 0.5 MG tablet Take 1 tablet (0.5 mg total) by mouth every 6 (six) hours as needed (Nausea or vomiting). 30 tablet 0 01/14/2022  ? Multiple Vitamin (MULTIVITAMIN) tablet Take 1 tablet by mouth daily.   01/14/2022  ? ondansetron (ZOFRAN) 8 MG tablet Take 1 tablet (8 mg total) by mouth 2 (two) times daily as needed for refractory nausea / vomiting. Start on day 3 after carboplatin chemo. 30 tablet 1 01/14/2022  ? potassium chloride SA (KLOR-CON M) 20 MEQ tablet Take 2 tablets (40 mEq total) by mouth 2 (two) times daily. 120 tablet 2 01/14/2022  ? simvastatin (ZOCOR) 40 MG  tablet Take 40 mg by mouth every evening.   01/14/2022  ? sitaGLIPtin-metformin (JANUMET) 50-500 MG tablet Take 1 tablet by mouth 2 (two) times daily with a meal.   01/14/2022  ? albuterol (VENTOLIN HFA) 108 (90 Base) MCG/ACT inhaler 2 puffs every 6 (six) hours as needed for wheezing or shortness of breath.    at prn  ? ciprofloxacin (CIPRO) 500 MG tablet Take 1 tablet (500 mg total) by mouth daily with breakfast. (Patient not taking: Reported on 01/15/2022) 15 tablet 4 Completed Course  ? fentaNYL (DURAGESIC) 12 MCG/HR Place 1 patch onto the skin every 3 (three) days. (Patient not taking: Reported on 01/15/2022) 5 patch 0 Not Taking  ? naloxone (NARCAN) nasal spray 4 mg/0.1 mL 1 spray as needed (overdose).    at prn  ? Oxycodone HCl 10 MG TABS Take 1 tablet (10 mg total) by mouth every 3 (three) hours as needed (breakthrough pain). 90 tablet 0  at prn  ? prochlorperazine (COMPAZINE) 10 MG tablet Take 1 tablet (10 mg total) by mouth every 6 (six) hours as needed (Nausea or vomiting). 30 tablet 1  at prn  ? senna-docusate (SENOKOT-S) 8.6-50 MG tablet Take 1 tablet by mouth 2 (two) times daily.    at prn  ? silver sulfADIAZINE (SILVADENE) 1 % cream Apply Silvadene to bilat legs Q day, then cover with ABD pads and apply kerlex and an ace  wrap in a spiral fashion 50 g 2   ? ? ? ?Allergies  ?Allergen Reactions  ? Rofecoxib Swelling  ? ? ? ?Past Medical History:  ?Diagnosis Date  ? Colon cancer [facility]) 2003  ? Diabetes mellitus without complication ([place])   ? Hypertension   ? Personal history of chemotherapy 2003  ? colon cancer  ? Uterine cancer [facility]) 2007  ? ? ?Review of systems:  Otherwise negative.  ? ? ?Physical Exam ? ?Gen: Alert, oriented. Appears stated age.  ?HEENT:PERRLA. ?Lungs: No respiratory distress ?CV: RRR ?Abd: soft, benign, no masses ?Ext: No edema ? ? ? ?Planned procedures: Proceed with EGD. The patient understands the nature of the planned procedure, indications, risks, alternatives and potential complications  including but not limited to bleeding, infection, perforation, damage to internal organs and possible oversedation/side effects from anesthesia. The patient agrees and gives consent to proceed.  ?Please refer to procedure notes for findings, recommendations and patient disposition/instructions.  ? ? ? ?Lesly Rubenstein, MD ?[facility] ? ? ? ?  ? ?

## 2022-01-15 NOTE — Anesthesia Preprocedure Evaluation (Signed)
Anesthesia Evaluation  ?Patient identified by MRN, date of birth, ID band ?Patient awake ? ? ? ?Reviewed: ?Allergy & Precautions, NPO status , Patient's Chart, lab work & pertinent test results ? ?History of Anesthesia Complications ?Negative for: history of anesthetic complications ? ?Airway ?Mallampati: III ? ?TM Distance: >3 FB ?Neck ROM: full ? ? ? Dental ? ?(+) Upper Dentures, Lower Dentures ?  ?Pulmonary ?shortness of breath and with exertion, pneumonia, former smoker,  ?  ? ?+ decreased breath sounds ? ? ? ? ? Cardiovascular ?hypertension, (-) anginaNormal cardiovascular exam ? ? ?  ?Neuro/Psych ?negative neurological ROS ? negative psych ROS  ? GI/Hepatic ?negative GI ROS, Neg liver ROS,   ?Endo/Other  ?diabetes, Type 2Hypothyroidism  ? Renal/GU ?Renal disease  ?negative genitourinary ?  ?Musculoskeletal ? ?(+) Arthritis ,  ? Abdominal ?  ?Peds ? Hematology ?negative hematology ROS ?(+)   ?Anesthesia Other Findings ?Past Medical History: ?2003: Colon cancer ([place]) ?No date: Diabetes mellitus without complication ([place]) ?No date: Hypertension ?2003: Personal history of chemotherapy ?    Comment:  colon cancer ?2007: Uterine cancer ([place]) ? ?Past Surgical History: ?No date: ABDOMINAL HYSTERECTOMY ?11/26/2010: BREAST EXCISIONAL BIOPSY; Right ?    Comment:  neg/- PROLIFERATIVE FIBROCYSTIC CHANGE WITH ASSOCIATED  ?No date: CHOLECYSTECTOMY ?No date: COLON SURGERY ?    Comment:  2003 ?No date: COLONOSCOPY ?12/11/2018: COLONOSCOPY WITH PROPOFOL; N/A ?    Comment:  Procedure: COLONOSCOPY WITH PROPOFOL;  Surgeon:  ?             Lollie Sails, MD;  Location: [facility] ENDOSCOPY;   ?             Service: Endoscopy;  Laterality: N/A; ?11/20/2021: IR IMAGING GUIDED PORT INSERTION ? ?BMI   ? Body Mass Index: 29.50 kg/m?  ?  ? ? Reproductive/Obstetrics ?negative OB ROS ? ?  ? ? ? ? ? ? ? ? ? ? ? ? ? ?  ?  ? ? ? ? ? ? ? ? ?Anesthesia Physical ?Anesthesia Plan ? ?ASA: 3 ? ?Anesthesia Plan: General   ? ?Post-op Pain Management:   ? ?Induction: Intravenous ? ?PONV Risk Score and Plan: Propofol infusion and TIVA ? ?Airway Management Planned: Natural Airway and Nasal Cannula ? ?Additional Equipment:  ? ?Intra-op Plan:  ? ?Post-operative Plan:  ? ?Informed Consent: I have reviewed the patients History and Physical, chart, labs and discussed the procedure including the risks, benefits and alternatives for the proposed anesthesia with the patient or authorized representative who has indicated his/her understanding and acceptance.  ? ? ? ?Dental Advisory Given ? ?Plan Discussed with: Anesthesiologist, CRNA and Surgeon ? ?Anesthesia Plan Comments: (Patient would like to leave her dentures in.  I told her that if she does that there is a risk that they could be broken by the scope or the bite block.  She voiced assent.  She plans to discuss this with Dr. Haig Prophet. ? ?Patient consented for risks of anesthesia including but not limited to:  ?- adverse reactions to medications ?- risk of airway placement if required ?- damage to eyes, teeth, lips or other oral mucosa ?- nerve damage due to positioning  ?- sore throat or hoarseness ?- Damage to heart, brain, nerves, lungs, other parts of body or loss of life ? ?Patient voiced understanding.)  ? ? ? ? ? ? ?Anesthesia Quick Evaluation ? ?

## 2022-01-16 ENCOUNTER — Inpatient Hospital Stay: Payer: BC Managed Care – PPO

## 2022-01-16 ENCOUNTER — Encounter: Payer: Self-pay | Admitting: *Deleted

## 2022-01-16 ENCOUNTER — Encounter: Payer: Self-pay | Admitting: Gastroenterology

## 2022-01-16 ENCOUNTER — Inpatient Hospital Stay: Payer: BC Managed Care – PPO | Admitting: Hematology & Oncology

## 2022-01-16 NOTE — Progress Notes (Signed)
Patient had unremarkable EGD. Scheduled to resume treatment on 01/21/22. ? ?Oncology Nurse Navigator Documentation ? ? ?  01/16/2022  ? 10:30 AM  ?Oncology Nurse Navigator Flowsheets  ?Navigator Follow Up Date: 01/21/2022  ?Navigator Follow Up Reason: Follow-up Appointment;Chemotherapy  ?Navigator Location [facility]  ?Navigator Encounter Type Appt/Treatment Plan Review  ?Patient Visit Type MedOnc  ?Treatment Phase Active Tx  ?Barriers/Navigation Needs Coordination of Care;Education  ?Interventions None Required  ?Acuity Level 2-Minimal Needs (1-2 Barriers Identified)  ?Support Groups/Services Friends and Family  ?Time Spent with Patient 15  ?  ?

## 2022-01-17 ENCOUNTER — Inpatient Hospital Stay: Payer: BC Managed Care – PPO

## 2022-01-18 ENCOUNTER — Inpatient Hospital Stay: Payer: BC Managed Care – PPO

## 2022-01-21 ENCOUNTER — Inpatient Hospital Stay (HOSPITAL_BASED_OUTPATIENT_CLINIC_OR_DEPARTMENT_OTHER): Payer: BC Managed Care – PPO | Admitting: Hematology & Oncology

## 2022-01-21 ENCOUNTER — Inpatient Hospital Stay: Payer: BC Managed Care – PPO

## 2022-01-21 ENCOUNTER — Encounter: Payer: Self-pay | Admitting: *Deleted

## 2022-01-21 ENCOUNTER — Encounter: Payer: Self-pay | Admitting: Hematology & Oncology

## 2022-01-21 ENCOUNTER — Other Ambulatory Visit: Payer: Self-pay | Admitting: *Deleted

## 2022-01-21 ENCOUNTER — Inpatient Hospital Stay: Payer: BC Managed Care – PPO | Attending: Oncology

## 2022-01-21 VITALS — BP 109/77 | HR 76 | Temp 98.0°F | Resp 20 | Wt 190.0 lb

## 2022-01-21 DIAGNOSIS — C3492 Malignant neoplasm of unspecified part of left bronchus or lung: Secondary | ICD-10-CM | POA: Insufficient documentation

## 2022-01-21 DIAGNOSIS — Z5189 Encounter for other specified aftercare: Secondary | ICD-10-CM | POA: Diagnosis not present

## 2022-01-21 DIAGNOSIS — C3490 Malignant neoplasm of unspecified part of unspecified bronchus or lung: Secondary | ICD-10-CM

## 2022-01-21 DIAGNOSIS — D649 Anemia, unspecified: Secondary | ICD-10-CM

## 2022-01-21 DIAGNOSIS — Z5112 Encounter for antineoplastic immunotherapy: Secondary | ICD-10-CM | POA: Insufficient documentation

## 2022-01-21 DIAGNOSIS — E119 Type 2 diabetes mellitus without complications: Secondary | ICD-10-CM

## 2022-01-21 DIAGNOSIS — D509 Iron deficiency anemia, unspecified: Secondary | ICD-10-CM

## 2022-01-21 DIAGNOSIS — E039 Hypothyroidism, unspecified: Secondary | ICD-10-CM | POA: Diagnosis not present

## 2022-01-21 DIAGNOSIS — Z5111 Encounter for antineoplastic chemotherapy: Secondary | ICD-10-CM | POA: Insufficient documentation

## 2022-01-21 LAB — CBC WITH DIFFERENTIAL (CANCER CENTER ONLY)
Abs Immature Granulocytes: 0.05 10*3/uL (ref 0.00–0.07)
Basophils Absolute: 0 10*3/uL (ref 0.0–0.1)
Basophils Relative: 0 %
Eosinophils Absolute: 0.1 10*3/uL (ref 0.0–0.5)
Eosinophils Relative: 1 %
HCT: 33.4 % — ABNORMAL LOW (ref 36.0–46.0)
Hemoglobin: 11.3 g/dL — ABNORMAL LOW (ref 12.0–15.0)
Immature Granulocytes: 1 %
Lymphocytes Relative: 19 %
Lymphs Abs: 1.5 10*3/uL (ref 0.7–4.0)
MCH: 32.9 pg (ref 26.0–34.0)
MCHC: 33.8 g/dL (ref 30.0–36.0)
MCV: 97.4 fL (ref 80.0–100.0)
Monocytes Absolute: 0.8 10*3/uL (ref 0.1–1.0)
Monocytes Relative: 10 %
Neutro Abs: 5.3 10*3/uL (ref 1.7–7.7)
Neutrophils Relative %: 69 %
Platelet Count: 209 10*3/uL (ref 150–400)
RBC: 3.43 MIL/uL — ABNORMAL LOW (ref 3.87–5.11)
RDW: 16.4 % — ABNORMAL HIGH (ref 11.5–15.5)
WBC Count: 7.8 10*3/uL (ref 4.0–10.5)
nRBC: 0 % (ref 0.0–0.2)

## 2022-01-21 LAB — CMP (CANCER CENTER ONLY)
ALT: 35 U/L (ref 0–44)
AST: 36 U/L (ref 15–41)
Albumin: 4.1 g/dL (ref 3.5–5.0)
Alkaline Phosphatase: 182 U/L — ABNORMAL HIGH (ref 38–126)
Anion gap: 9 (ref 5–15)
BUN: 11 mg/dL (ref 8–23)
CO2: 29 mmol/L (ref 22–32)
Calcium: 8.8 mg/dL — ABNORMAL LOW (ref 8.9–10.3)
Chloride: 99 mmol/L (ref 98–111)
Creatinine: 0.88 mg/dL (ref 0.44–1.00)
GFR, Estimated: >60 mL/min (ref >60)
Glucose, Bld: 204 mg/dL — ABNORMAL HIGH (ref 70–99)
Potassium: 3.8 mmol/L (ref 3.5–5.1)
Sodium: 137 mmol/L (ref 135–145)
Total Bilirubin: 0.6 mg/dL (ref 0.3–1.2)
Total Protein: 7.1 g/dL (ref 6.5–8.1)

## 2022-01-21 LAB — IRON AND IRON BINDING CAPACITY (CC-WL,HP ONLY)
Iron: 82 ug/dL (ref 28–170)
Saturation Ratios: 23 % (ref 10.4–31.8)
TIBC: 358 ug/dL (ref 250–450)
UIBC: 276 ug/dL (ref 148–442)

## 2022-01-21 LAB — SAMPLE TO BLOOD BANK

## 2022-01-21 LAB — LACTATE DEHYDROGENASE: LDH: 168 U/L (ref 98–192)

## 2022-01-21 LAB — FERRITIN: Ferritin: 290 ng/mL (ref 11–307)

## 2022-01-21 MED ORDER — SODIUM CHLORIDE 0.9% FLUSH
10.0000 mL | INTRAVENOUS | Status: DC | PRN
  Administered 2022-01-21: 10 mL

## 2022-01-21 MED ORDER — HEPARIN SOD (PORK) LOCK FLUSH 100 UNIT/ML IV SOLN
500.0000 [IU] | Freq: Once | INTRAVENOUS | Status: AC | PRN
  Administered 2022-01-21: 500 [IU]

## 2022-01-21 MED ORDER — SODIUM CHLORIDE 0.9 % IV SOLN
10.0000 mg | Freq: Once | INTRAVENOUS | Status: AC
  Administered 2022-01-21: 10 mg via INTRAVENOUS
  Filled 2022-01-21: qty 10

## 2022-01-21 MED ORDER — SODIUM CHLORIDE 0.9 % IV SOLN
150.0000 mg | Freq: Once | INTRAVENOUS | Status: AC
  Administered 2022-01-21: 150 mg via INTRAVENOUS
  Filled 2022-01-21: qty 150

## 2022-01-21 MED ORDER — SODIUM CHLORIDE 0.9 % IV SOLN
90.0000 mg/m2 | Freq: Once | INTRAVENOUS | Status: AC
  Administered 2022-01-21: 190 mg via INTRAVENOUS
  Filled 2022-01-21: qty 9.5

## 2022-01-21 MED ORDER — SODIUM CHLORIDE 0.9 % IV SOLN
580.0000 mg | Freq: Once | INTRAVENOUS | Status: AC
  Administered 2022-01-21: 580 mg via INTRAVENOUS
  Filled 2022-01-21: qty 58

## 2022-01-21 MED ORDER — SODIUM CHLORIDE 0.9 % IV SOLN: Freq: Once | INTRAVENOUS | Status: AC

## 2022-01-21 MED ORDER — HOT PACK MISC ONCOLOGY: 1.0000 | Freq: Once | Status: DC | PRN

## 2022-01-21 MED ORDER — PALONOSETRON HCL INJECTION 0.25 MG/5ML
0.2500 mg | Freq: Once | INTRAVENOUS | Status: AC
  Administered 2022-01-21: 0.25 mg via INTRAVENOUS
  Filled 2022-01-21: qty 5

## 2022-01-21 NOTE — Progress Notes (Signed)
?Hematology and Oncology Follow Up Visit ? ?Pamela [name] ?[id number] ?1960-10-11 61 y.o. ?01/21/2022 ? ? ?Principle Diagnosis:  ?Extensive stage small cell lung cancer ?  ?Current Therapy:        ?Status post cycle #3 of carboplatinum/etoposide/Tecentriq -1st cycle given in the hospital on 11/05/2021 ?  ?Interim History:  Pamela [name] is here today with her husband.  She finally had the upper endoscopy because of the GI bleeding.  From what she says, they did not find anything with respect to blood. ? ?Her hemoglobin is a lot better.  It is 11.3. ? ?We will go ahead with her chemotherapy now.  I just had to hold the chemotherapy because of the bleeding.  I had to figure out if we could find where the bleeding came from. ? ?She has had no abdominal pain.  There is no change in bowel or bladder habits.  She has had no fever.  There is no cough or shortness of breath. ? ?Her legs look fantastic.  She has no swelling in the legs. ? ?Overall, I would have said that her performance status is probably ECOG 1.   ? ?Medications:  ?Allergies as of 01/21/2022   ? ?   Reactions  ? Rofecoxib Swelling  ? ?  ? ?  ?Medication List  ?  ? ?  ? Accurate as of Jan 21, 2022  2:52 PM. If you have any questions, ask your nurse or doctor.  ?  ?  ? ?  ? ?STOP taking these medications   ? ?hydrOXYzine 10 MG tablet ?Commonly known as: ATARAX ?Stopped by: [name], MD ?  ?silver sulfADIAZINE 1 % cream ?Commonly known as: Silvadene ?Stopped by: [name], MD ?  ? ?  ? ?TAKE these medications   ? ?albuterol 108 (90 Base) MCG/ACT inhaler ?Commonly known as: VENTOLIN HFA ?2 puffs every 6 (six) hours as needed for wheezing or shortness of breath. ?  ?ciprofloxacin 500 MG tablet ?Commonly known as: Cipro ?Take 1 tablet (500 mg total) by mouth daily with breakfast. ?  ?dexamethasone 4 MG tablet ?Commonly known as: DECADRON ?Take 2 tablets (8 mg total) by mouth daily. Start the day after chemotherapy for 1 day. ?  ?diclofenac Sodium 1 %  Gel ?Commonly known as: VOLTAREN ?Apply topically 4 (four) times daily. ?  ?esomeprazole 20 MG capsule ?Commonly known as: [place] ?Take 20 mg by mouth daily at 12 noon. ?  ?fentaNYL 12 MCG/HR ?Commonly known as: [place] ?Place 1 patch onto the skin every 3 (three) days. ?  ?furosemide 40 MG tablet ?Commonly known as: Lasix ?Take 1 tablet (40 mg total) by mouth daily. ?  ?levothyroxine 125 MCG tablet ?Commonly known as: SYNTHROID ?Take 0.5 tablets (62.5 mcg total) by mouth daily before breakfast. ?  ?LORazepam 0.5 MG tablet ?Commonly known as: Ativan ?Take 1 tablet (0.5 mg total) by mouth every 6 (six) hours as needed (Nausea or vomiting). ?  ?multivitamin tablet ?Take 1 tablet by mouth daily. ?  ?naloxone 4 MG/0.1ML Liqd nasal spray kit ?Commonly known as: NARCAN ?1 spray as needed (overdose). ?  ?ondansetron 8 MG tablet ?Commonly known as: Zofran ?Take 1 tablet (8 mg total) by mouth 2 (two) times daily as needed for refractory nausea / vomiting. Start on day 3 after carboplatin chemo. ?  ?Oxycodone HCl 10 MG Tabs ?Take 1 tablet (10 mg total) by mouth every 3 (three) hours as needed (breakthrough pain). ?  ?potassium chloride SA 20 MEQ  tablet ?Commonly known as: KLOR-CON M ?Take 2 tablets (40 mEq total) by mouth 2 (two) times daily. ?  ?prochlorperazine 10 MG tablet ?Commonly known as: COMPAZINE ?Take 1 tablet (10 mg total) by mouth every 6 (six) hours as needed (Nausea or vomiting). ?  ?senna-docusate 8.6-50 MG tablet ?Commonly known as: Senokot-S ?Take 1 tablet by mouth 2 (two) times daily. ?  ?simvastatin 40 MG tablet ?Commonly known as: ZOCOR ?Take 40 mg by mouth every evening. ?  ?sitaGLIPtin-metformin 50-500 MG tablet ?Commonly known as: JANUMET ?Take 1 tablet by mouth 2 (two) times daily with a meal. ?  ? ?  ? ? ?Allergies:  ?Allergies  ?Allergen Reactions  ? Rofecoxib Swelling  ? ? ?Past Medical History, Surgical history, Social history, and Family History were reviewed and updated. ? ?Review of  Systems: ?Review of Systems  ?Constitutional: Negative.   ?HENT: Negative.    ?Eyes: Negative.   ?Respiratory: Negative.    ?Cardiovascular: Negative.   ?Gastrointestinal:  Positive for blood in stool.  ?Genitourinary: Negative.   ?Musculoskeletal: Negative.   ?Skin: Negative.   ?Neurological: Negative.   ?Endo/Heme/Allergies: Negative.   ?Psychiatric/Behavioral: Negative.    ?  ? ?Physical Exam: ? weight is 190 lb (86.2 kg). Her oral temperature is 98 ?F (36.7 ?C). Her blood pressure is 109/77 and her pulse is 76. Her respiration is 20 and oxygen saturation is 96%.  ? ?Wt Readings from Last 3 Encounters:  ?01/21/22 190 lb (86.2 kg)  ?01/15/22 188 lb 6.1 oz (85.5 kg)  ?01/02/22 187 lb 0.6 oz (84.8 kg)  ? ?Physical Exam ?Vitals reviewed.  ?HENT:  ?   Head: Normocephalic and atraumatic.  ?Eyes:  ?   Pupils: Pupils are equal, round, and reactive to light.  ?Cardiovascular:  ?   Rate and Rhythm: Normal rate and regular rhythm.  ?   Heart sounds: Normal heart sounds.  ?Pulmonary:  ?   Effort: Pulmonary effort is normal.  ?   Breath sounds: Normal breath sounds.  ?Abdominal:  ?   General: Bowel sounds are normal.  ?   Palpations: Abdomen is soft.  ?Musculoskeletal:     ?   General: No tenderness or deformity. Normal range of motion.  ?   Cervical back: Normal range of motion.  ?Lymphadenopathy:  ?   Cervical: No cervical adenopathy.  ?Skin: ?   General: Skin is warm and dry.  ?   Findings: No erythema or rash.  ?Neurological:  ?   Mental Status: She is alert and oriented to person, place, and time.  ?Psychiatric:     ?   Behavior: Behavior normal.     ?   Thought Content: Thought content normal.     ?   Judgment: Judgment normal.  ? ? ?Lab Results  ?Component Value Date  ? WBC 7.8 01/21/2022  ? HGB 11.3 (L) 01/21/2022  ? HCT 33.4 (L) 01/21/2022  ? MCV 97.4 01/21/2022  ? PLT 209 01/21/2022  ? ?Lab Results  ?Component Value Date  ? FERRITIN 290 01/21/2022  ? IRON 82 01/21/2022  ? TIBC 358 01/21/2022  ? UIBC 276 01/21/2022   ? IRONPCTSAT 23 01/21/2022  ? ?Lab Results  ?Component Value Date  ? RBC 3.43 (L) 01/21/2022  ? ?No results found for: KPAFRELGTCHN, LAMBDASER, KAPLAMBRATIO ?No results found for: IGGSERUM, IGA, IGMSERUM ?No results found for: TOTALPROTELP, ALBUMINELP, A1GS, A2GS, BETS, BETA2SER, GAMS, MSPIKE, SPEI ?  Chemistry   ?   ?Component Value Date/Time  ? NA  137 01/21/2022 0857  ? K 3.8 01/21/2022 0857  ? CL 99 01/21/2022 0857  ? CO2 29 01/21/2022 0857  ? BUN 11 01/21/2022 0857  ? CREATININE 0.88 01/21/2022 0857  ?    ?Component Value Date/Time  ? CALCIUM 8.8 (L) 01/21/2022 0857  ? ALKPHOS 182 (H) 01/21/2022 0857  ? AST 36 01/21/2022 0857  ? ALT 35 01/21/2022 0857  ? BILITOT 0.6 01/21/2022 0857  ?  ? ? ? ?Impression and Plan: Pamela [name] is a very pleasant 61 yo caucasian female with extensive stage small cell lung cancer.  ? ?She has had 3 cycles of chemotherapy.  She has responded as expected.  We have had to make a little bit of a dosage adjustment.  We are checking her labs closely after she gets treatment. ? ?Again am not sure where she could have the bleeding from.  Hopefully, this will not be a problem for her in the future. ? ?We will go ahead with her fourth cycle of treatment.  After this fourth cycle, we will then do our scans and see how everything looks. ? ?We will have her come back in about 10 days or so just to follow-up with her lab work.    ? ?[name], MD ?5/8/20232:52 PM ? ?

## 2022-01-21 NOTE — Progress Notes (Signed)
SCr = 0.88 today. ?Carboplatin dose change to 580 mg per Dr. Antonieta Pert instructions. ?

## 2022-01-21 NOTE — Patient Instructions (Signed)
[facility] [place]  Discharge Instructions: ?Thank you for choosing [facility] to provide your oncology and hematology care.  ? ?If you have a lab appointment with the [facility], please go directly to the [facility] and check in at the registration area. ? ?Wear comfortable clothing and clothing appropriate for easy access to any Portacath or PICC line.  ? ?We strive to give you quality time with your provider. You may need to reschedule your appointment if you arrive late (15 or more minutes).  Arriving late affects you and other patients whose appointments are after yours.  Also, if you miss three or more appointments without notifying the office, you may be dismissed from the clinic at the provider?s discretion.    ?  ?For prescription refill requests, have your pharmacy contact our office and allow 72 hours for refills to be completed.   ? ?Today you received the following chemotherapy and/or immunotherapy agents Carboplatin, Etoposide.    ?  ?To help prevent nausea and vomiting after your treatment, we encourage you to take your nausea medication as directed. ? ?BELOW ARE SYMPTOMS THAT SHOULD BE REPORTED IMMEDIATELY: ?*FEVER GREATER THAN 100.4 F (38 ?C) OR HIGHER ?*CHILLS OR SWEATING ?*NAUSEA AND VOMITING THAT IS NOT CONTROLLED WITH YOUR NAUSEA MEDICATION ?*UNUSUAL SHORTNESS OF BREATH ?*UNUSUAL BRUISING OR BLEEDING ?*URINARY PROBLEMS (pain or burning when urinating, or frequent urination) ?*BOWEL PROBLEMS (unusual diarrhea, constipation, pain near the anus) ?TENDERNESS IN MOUTH AND THROAT WITH OR WITHOUT PRESENCE OF ULCERS (sore throat, sores in mouth, or a toothache) ?UNUSUAL RASH, SWELLING OR PAIN  ?UNUSUAL VAGINAL DISCHARGE OR ITCHING  ? ?Items with * indicate a potential emergency and should be followed up as soon as possible or go to the Emergency Department if any problems should occur. ? ?Please show the CHEMOTHERAPY ALERT CARD or IMMUNOTHERAPY ALERT CARD at  check-in to the Emergency Department and triage nurse. ?Should you have questions after your visit or need to cancel or reschedule your appointment, please contact [facility]  [phone] and follow the prompts.  Office hours are 8:00 a.m. to 4:30 p.m. Monday - Friday. Please note that voicemails left after 4:00 p.m. may not be returned until the following business day.  We are closed weekends and major holidays. You have access to a nurse at all times for urgent questions. Please call the main number to the clinic [phone] and follow the prompts. ? ?For any non-urgent questions, you may also contact your provider using MyChart. We now offer e-Visits for anyone 33 and older to request care online for non-urgent symptoms. For details visit [email or web address]. ?  ?Also download the MyChart app! Go to the app store, search "MyChart", open the app, select [facility], and log in with your MyChart username and password. ? ?Due to Covid, a mask is required upon entering the hospital/clinic. If you do not have a mask, one will be given to you upon arrival. For doctor visits, patients may have 1 support person aged 54 or older with them. For treatment visits, patients cannot have anyone with them due to current Covid guidelines and our immunocompromised population.  ?

## 2022-01-21 NOTE — Progress Notes (Signed)
Patient is doing well today. She feels good with no complaints. She will need CTs prior to her next appointment. Will schedule once PA obtained. ? ?Oncology Nurse Navigator Documentation ? ? ?  01/21/2022  ? 10:15 AM  ?Oncology Nurse Navigator Flowsheets  ?Navigator Follow Up Date: 01/23/2022  ?Navigator Follow Up Reason: Radiology  ?Navigator Location [facility]  ?Navigator Encounter Type Treatment;Appt/Treatment Plan Review  ?Patient Visit Type MedOnc  ?Treatment Phase Active Tx  ?Barriers/Navigation Needs Coordination of Care;Education  ?Interventions Psycho-Social Support  ?Acuity Level 2-Minimal Needs (1-2 Barriers Identified)  ?Support Groups/Services Friends and Family  ?Time Spent with Patient 15  ?  ? ?

## 2022-01-22 ENCOUNTER — Inpatient Hospital Stay: Payer: BC Managed Care – PPO

## 2022-01-22 VITALS — BP 87/71 | HR 76 | Temp 97.8°F | Resp 18

## 2022-01-22 DIAGNOSIS — Z5111 Encounter for antineoplastic chemotherapy: Secondary | ICD-10-CM | POA: Diagnosis not present

## 2022-01-22 DIAGNOSIS — C3490 Malignant neoplasm of unspecified part of unspecified bronchus or lung: Secondary | ICD-10-CM

## 2022-01-22 MED ORDER — HEPARIN SOD (PORK) LOCK FLUSH 100 UNIT/ML IV SOLN
500.0000 [IU] | Freq: Once | INTRAVENOUS | Status: AC | PRN
  Administered 2022-01-22: 500 [IU]

## 2022-01-22 MED ORDER — LEVOTHYROXINE SODIUM 125 MCG PO TABS: 125.0000 ug | ORAL_TABLET | Freq: Every day | ORAL | 3 refills | Status: DC

## 2022-01-22 MED ORDER — HOT PACK MISC ONCOLOGY: 1.0000 | Freq: Once | Status: DC | PRN

## 2022-01-22 MED ORDER — SODIUM CHLORIDE 0.9 % IV SOLN
10.0000 mg | Freq: Once | INTRAVENOUS | Status: AC
  Administered 2022-01-22: 10 mg via INTRAVENOUS
  Filled 2022-01-22: qty 10

## 2022-01-22 MED ORDER — SODIUM CHLORIDE 0.9% FLUSH
10.0000 mL | INTRAVENOUS | Status: DC | PRN
  Administered 2022-01-22: 10 mL

## 2022-01-22 MED ORDER — SODIUM CHLORIDE 0.9 % IV SOLN: Freq: Once | INTRAVENOUS | Status: DC

## 2022-01-22 MED ORDER — SODIUM CHLORIDE 0.9 % IV SOLN: Freq: Once | INTRAVENOUS | Status: AC

## 2022-01-22 MED ORDER — SODIUM CHLORIDE 0.9 % IV SOLN
90.0000 mg/m2 | Freq: Once | INTRAVENOUS | Status: AC
  Administered 2022-01-22: 180 mg via INTRAVENOUS
  Filled 2022-01-22: qty 9

## 2022-01-22 MED ORDER — SODIUM CHLORIDE 0.9 % IV SOLN
1200.0000 mg | Freq: Once | INTRAVENOUS | Status: AC
  Administered 2022-01-22: 1200 mg via INTRAVENOUS
  Filled 2022-01-22: qty 20

## 2022-01-22 NOTE — Patient Instructions (Signed)
[facility] [place]  Discharge Instructions: ?Thank you for choosing [facility] to provide your oncology and hematology care.  ? ?If you have a lab appointment with the [facility], please go directly to the [facility] and check in at the registration area. ? ?Wear comfortable clothing and clothing appropriate for easy access to any Portacath or PICC line.  ? ?We strive to give you quality time with your provider. You may need to reschedule your appointment if you arrive late (15 or more minutes).  Arriving late affects you and other patients whose appointments are after yours.  Also, if you miss three or more appointments without notifying the office, you may be dismissed from the clinic at the provider?s discretion.    ?  ?For prescription refill requests, have your pharmacy contact our office and allow 72 hours for refills to be completed.   ? ?Today you received the following chemotherapy and/or immunotherapy agents Vepesid, Tecentriq.    ?  ?To help prevent nausea and vomiting after your treatment, we encourage you to take your nausea medication as directed. ? ?BELOW ARE SYMPTOMS THAT SHOULD BE REPORTED IMMEDIATELY: ?*FEVER GREATER THAN 100.4 F (38 ?C) OR HIGHER ?*CHILLS OR SWEATING ?*NAUSEA AND VOMITING THAT IS NOT CONTROLLED WITH YOUR NAUSEA MEDICATION ?*UNUSUAL SHORTNESS OF BREATH ?*UNUSUAL BRUISING OR BLEEDING ?*URINARY PROBLEMS (pain or burning when urinating, or frequent urination) ?*BOWEL PROBLEMS (unusual diarrhea, constipation, pain near the anus) ?TENDERNESS IN MOUTH AND THROAT WITH OR WITHOUT PRESENCE OF ULCERS (sore throat, sores in mouth, or a toothache) ?UNUSUAL RASH, SWELLING OR PAIN  ?UNUSUAL VAGINAL DISCHARGE OR ITCHING  ? ?Items with * indicate a potential emergency and should be followed up as soon as possible or go to the Emergency Department if any problems should occur. ? ?Please show the CHEMOTHERAPY ALERT CARD or IMMUNOTHERAPY ALERT CARD at  check-in to the Emergency Department and triage nurse. ?Should you have questions after your visit or need to cancel or reschedule your appointment, please contact [facility]  [phone] and follow the prompts.  Office hours are 8:00 a.m. to 4:30 p.m. Monday - Friday. Please note that voicemails left after 4:00 p.m. may not be returned until the following business day.  We are closed weekends and major holidays. You have access to a nurse at all times for urgent questions. Please call the main number to the clinic [phone] and follow the prompts. ? ?For any non-urgent questions, you may also contact your provider using MyChart. We now offer e-Visits for anyone 93 and older to request care online for non-urgent symptoms. For details visit [email or web address]. ?  ?Also download the MyChart app! Go to the app store, search "MyChart", open the app, select [facility], and log in with your MyChart username and password. ? ?Due to Covid, a mask is required upon entering the hospital/clinic. If you do not have a mask, one will be given to you upon arrival. For doctor visits, patients may have 1 support person aged 17 or older with them. For treatment visits, patients cannot have anyone with them due to current Covid guidelines and our immunocompromised population.  ?

## 2022-01-22 NOTE — Addendum Note (Signed)
Addended by: Burney Gauze R on: 01/22/2022 09:00 AM ? ? Modules accepted: Orders ? ?

## 2022-01-23 ENCOUNTER — Encounter: Payer: Self-pay | Admitting: Hematology & Oncology

## 2022-01-23 ENCOUNTER — Inpatient Hospital Stay: Payer: BC Managed Care – PPO

## 2022-01-23 ENCOUNTER — Encounter: Payer: Self-pay | Admitting: *Deleted

## 2022-01-23 VITALS — BP 118/71 | HR 71 | Temp 97.5°F | Resp 17

## 2022-01-23 DIAGNOSIS — C3490 Malignant neoplasm of unspecified part of unspecified bronchus or lung: Secondary | ICD-10-CM

## 2022-01-23 DIAGNOSIS — Z5111 Encounter for antineoplastic chemotherapy: Secondary | ICD-10-CM | POA: Diagnosis not present

## 2022-01-23 MED ORDER — SODIUM CHLORIDE 0.9 % IV SOLN: Freq: Once | INTRAVENOUS | Status: AC

## 2022-01-23 MED ORDER — SODIUM CHLORIDE 0.9 % IV SOLN
10.0000 mg | Freq: Once | INTRAVENOUS | Status: AC
  Administered 2022-01-23: 10 mg via INTRAVENOUS
  Filled 2022-01-23: qty 10

## 2022-01-23 MED ORDER — HEPARIN SOD (PORK) LOCK FLUSH 100 UNIT/ML IV SOLN
500.0000 [IU] | Freq: Once | INTRAVENOUS | Status: AC | PRN
  Administered 2022-01-23: 500 [IU]

## 2022-01-23 MED ORDER — SODIUM CHLORIDE 0.9% FLUSH
10.0000 mL | INTRAVENOUS | Status: DC | PRN
  Administered 2022-01-23: 10 mL

## 2022-01-23 MED ORDER — SODIUM CHLORIDE 0.9 % IV SOLN
90.0000 mg/m2 | Freq: Once | INTRAVENOUS | Status: AC
  Administered 2022-01-23: 180 mg via INTRAVENOUS
  Filled 2022-01-23: qty 9

## 2022-01-23 NOTE — Progress Notes (Signed)
CTs scheduled for 01/30/2022. ? ?Reviewed appointment with patient while she was in infusion. She is aware of appointment date, time and location. Educated to prep and need for contrast. Radiology info sheet also given to patient with same information.  ? ?Oncology Nurse Navigator Documentation ? ? ?  01/23/2022  ?  8:45 AM  ?Oncology Nurse Navigator Flowsheets  ?Navigator Follow Up Date: 01/30/2022  ?Navigator Follow Up Reason: Scan Review  ?Navigator Location [facility]  ?Navigator Encounter Type Treatment;Appt/Treatment Plan Review  ?Patient Visit Type MedOnc  ?Treatment Phase Active Tx  ?Barriers/Navigation Needs Coordination of Care;Education  ?Education Other  ?Interventions Coordination of Care;Education;Psycho-Social Support  ?Acuity Level 2-Minimal Needs (1-2 Barriers Identified)  ?Coordination of Care Radiology  ?Education Method Verbal;Written  ?Support Groups/Services Friends and Family  ?Time Spent with Patient 15  ?  ?

## 2022-01-23 NOTE — Patient Instructions (Signed)
[facility]  Discharge Instructions: ?Thank you for choosing [facility] to provide your oncology and hematology care.  ? ?If you have a lab appointment with the [facility], please go directly to the [facility] and check in at the registration area. ? ?Wear comfortable clothing and clothing appropriate for easy access to any Portacath or PICC line.  ? ?We strive to give you quality time with your provider. You may need to reschedule your appointment if you arrive late (15 or more minutes).  Arriving late affects you and other patients whose appointments are after yours.  Also, if you miss three or more appointments without notifying the office, you may be dismissed from the clinic at the provider?s discretion.    ?  ?For prescription refill requests, have your pharmacy contact our office and allow 72 hours for refills to be completed.   ? ?Today you received the following chemotherapy and/or immunotherapy agents VP-16    ?  ?To help prevent nausea and vomiting after your treatment, we encourage you to take your nausea medication as directed. ? ?BELOW ARE SYMPTOMS THAT SHOULD BE REPORTED IMMEDIATELY: ?*FEVER GREATER THAN 100.4 F (38 ?C) OR HIGHER ?*CHILLS OR SWEATING ?*NAUSEA AND VOMITING THAT IS NOT CONTROLLED WITH YOUR NAUSEA MEDICATION ?*UNUSUAL SHORTNESS OF BREATH ?*UNUSUAL BRUISING OR BLEEDING ?*URINARY PROBLEMS (pain or burning when urinating, or frequent urination) ?*BOWEL PROBLEMS (unusual diarrhea, constipation, pain near the anus) ?TENDERNESS IN MOUTH AND THROAT WITH OR WITHOUT PRESENCE OF ULCERS (sore throat, sores in mouth, or a toothache) ?UNUSUAL RASH, SWELLING OR PAIN  ?UNUSUAL VAGINAL DISCHARGE OR ITCHING  ? ?Items with * indicate a potential emergency and should be followed up as soon as possible or go to the Emergency Department if any problems should occur. ? ?Please show the CHEMOTHERAPY ALERT CARD or IMMUNOTHERAPY ALERT CARD at check-in to the  Emergency Department and triage nurse. ?Should you have questions after your visit or need to cancel or reschedule your appointment, please contact [facility]  [phone] and follow the prompts.  Office hours are 8:00 a.m. to 4:30 p.m. Monday - Friday. Please note that voicemails left after 4:00 p.m. may not be returned until the following business day.  We are closed weekends and major holidays. You have access to a nurse at all times for urgent questions. Please call the main number to the clinic [phone] and follow the prompts. ? ?For any non-urgent questions, you may also contact your provider using MyChart. We now offer e-Visits for anyone 11 and older to request care online for non-urgent symptoms. For details visit [email or web address]. ?  ?Also download the MyChart app! Go to the app store, search "MyChart", open the app, select [facility], and log in with your MyChart username and password. ? ?Due to Covid, a mask is required upon entering the hospital/clinic. If you do not have a mask, one will be given to you upon arrival. For doctor visits, patients may have 1 support person aged 62 or older with them. For treatment visits, patients cannot have anyone with them due to current Covid guidelines and our immunocompromised population.  ?

## 2022-01-25 ENCOUNTER — Inpatient Hospital Stay: Payer: BC Managed Care – PPO

## 2022-01-25 DIAGNOSIS — Z5111 Encounter for antineoplastic chemotherapy: Secondary | ICD-10-CM | POA: Diagnosis not present

## 2022-01-25 DIAGNOSIS — C3490 Malignant neoplasm of unspecified part of unspecified bronchus or lung: Secondary | ICD-10-CM

## 2022-01-25 MED ORDER — PEGFILGRASTIM-CBQV 6 MG/0.6ML ~~LOC~~ SOSY
6.0000 mg | PREFILLED_SYRINGE | Freq: Once | SUBCUTANEOUS | Status: AC
  Administered 2022-01-25: 6 mg via SUBCUTANEOUS
  Filled 2022-01-25: qty 0.6

## 2022-01-30 ENCOUNTER — Ambulatory Visit
Admission: RE | Admit: 2022-01-30 | Discharge: 2022-01-30 | Disposition: A | Discharge status: Home / Self Care | Payer: BC Managed Care – PPO | Source: Ambulatory Visit | Attending: Hematology & Oncology | Admitting: Hematology & Oncology

## 2022-01-30 DIAGNOSIS — C3490 Malignant neoplasm of unspecified part of unspecified bronchus or lung: Secondary | ICD-10-CM | POA: Insufficient documentation

## 2022-01-30 IMAGING — CT CT CHEST-ABD-PELV W/ CM
2 of 5 series · 14 of 46 positions shown, 16 images · IV contrast (APPLIED)
Comparison: [DATE]

CLINICAL DATA: Small-cell lung cancer, assess treatment response,
status post chemotherapy * Tracking Code: BO *

EXAM:
CT CHEST, ABDOMEN, AND PELVIS WITH CONTRAST
TECHNIQUE: Multidetector CT imaging of the chest, abdomen and pelvis was
performed following the standard protocol during bolus
administration of intravenous contrast.

[Series 3: cap with · axial · 0.82mm/px · z∈[+1084,+1584]mm · 11 of 120 slices shown, 13 images]
[im 10/120  soft-tissue]
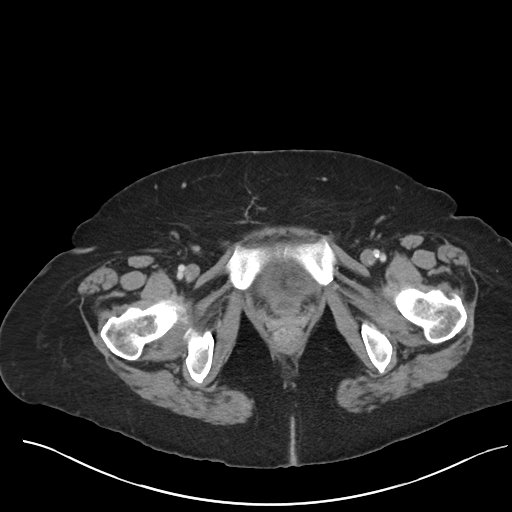
[im 10/120  bone]
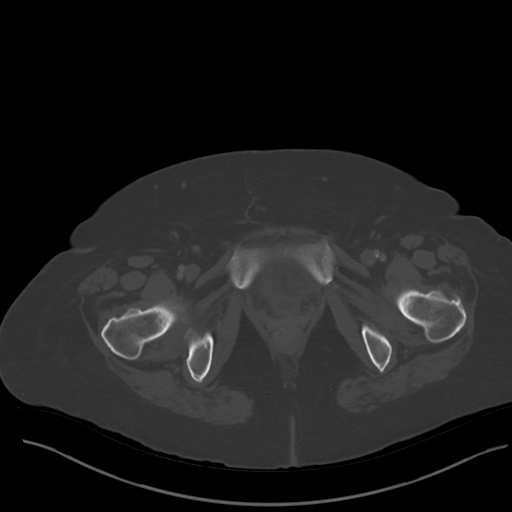
[im 20/120  soft-tissue]
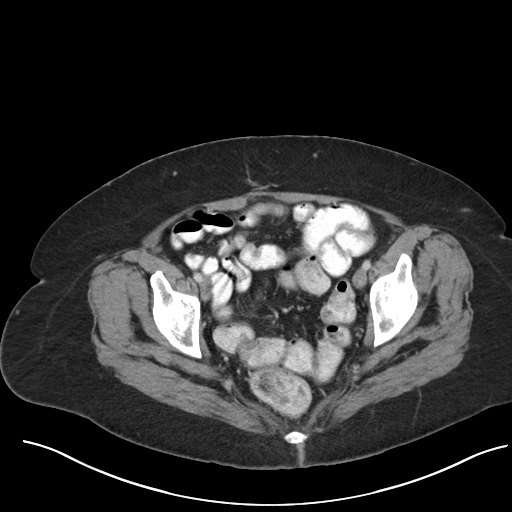
[im 30/120  soft-tissue]
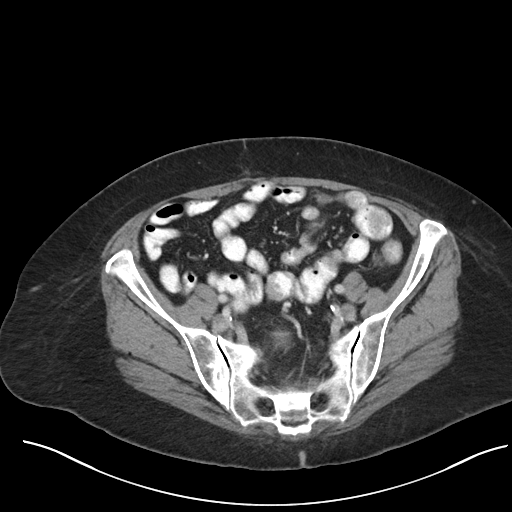
[im 40/120  soft-tissue]
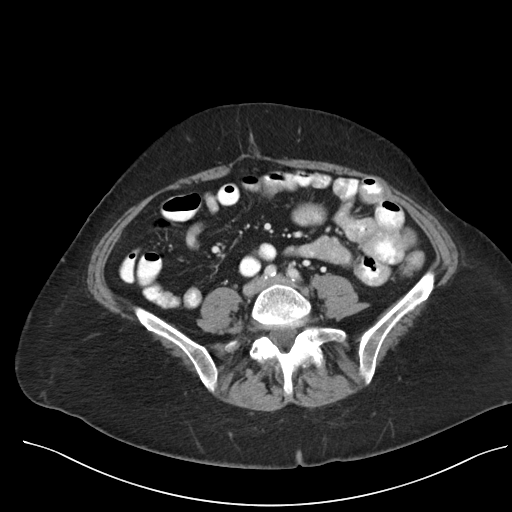
[im 50/120  soft-tissue]
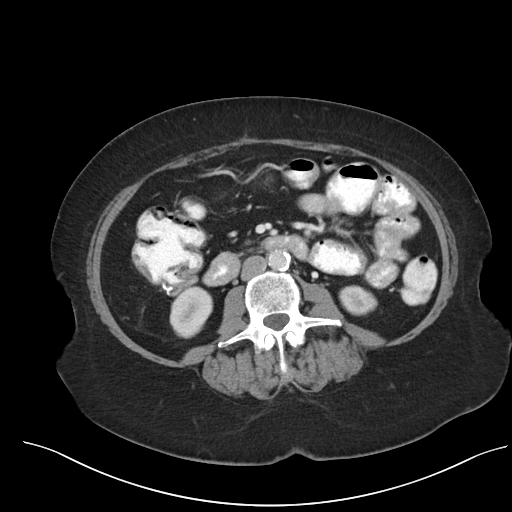
[im 60/120  soft-tissue]
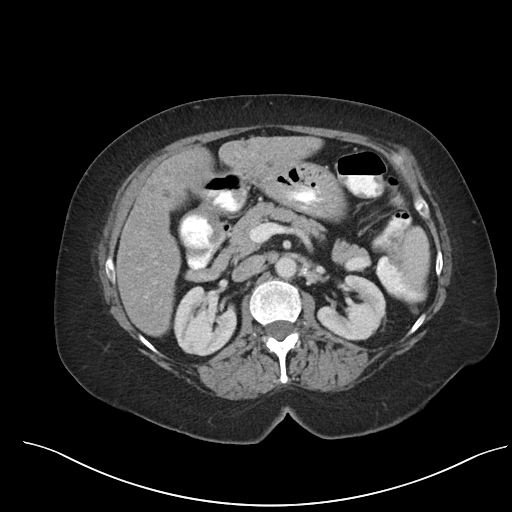
[im 70/120  soft-tissue]
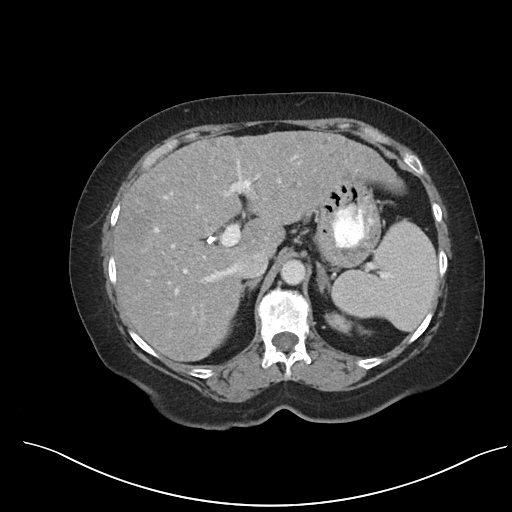
[im 80/120  soft-tissue]
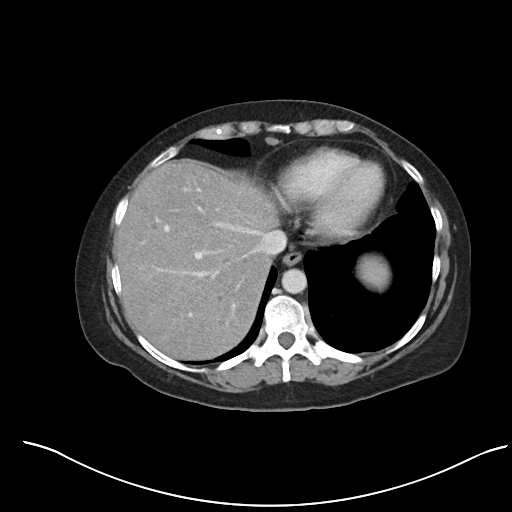
[im 90/120  soft-tissue]
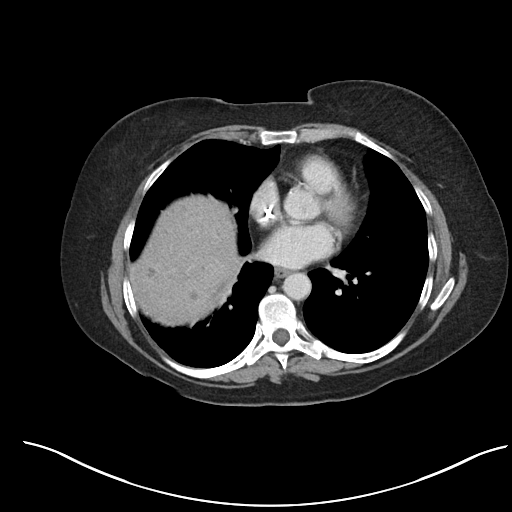
[im 90/120  bone]
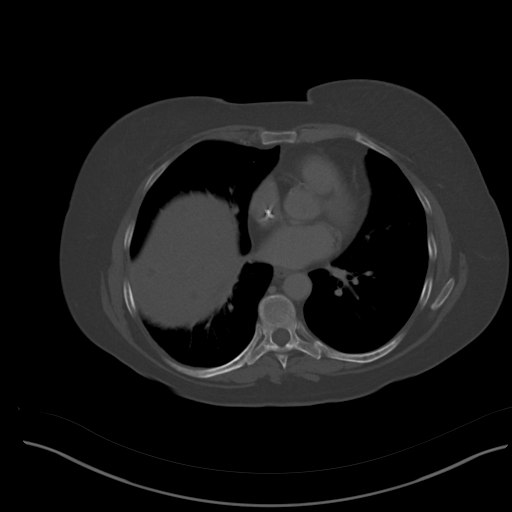
[im 100/120  soft-tissue]
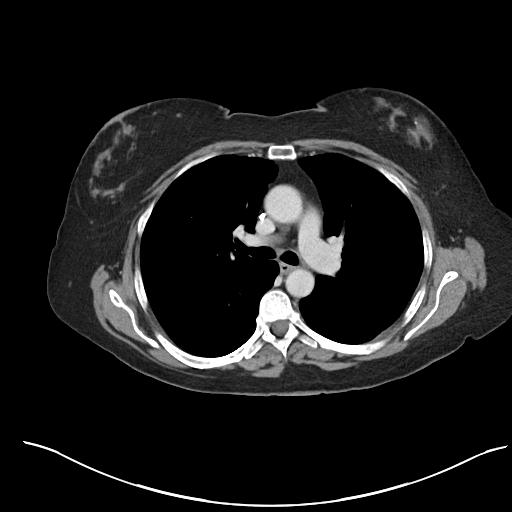
[im 110/120  soft-tissue]
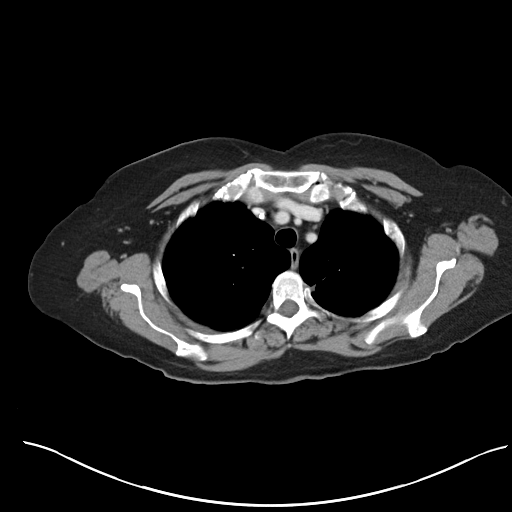

[Series 6: cor · coronal · 0.92mm/px · 3 of 107 slices shown]
[im 36/107  soft-tissue]
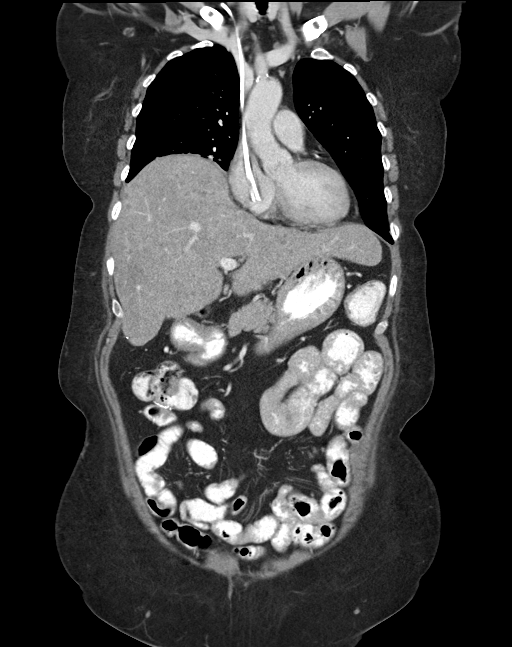
[im 48/107  soft-tissue]
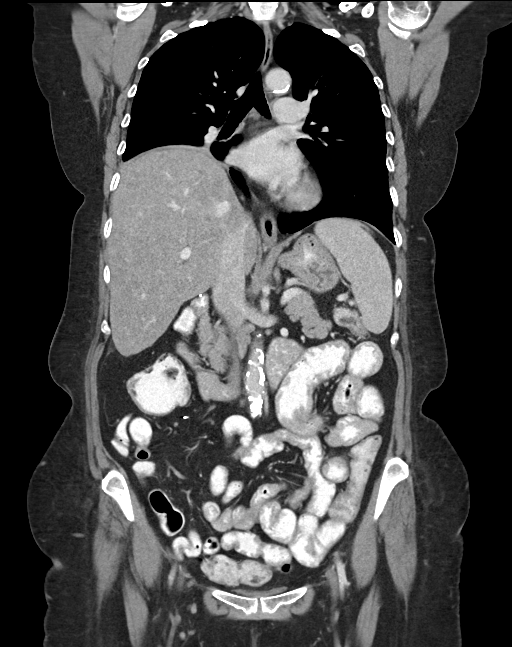
[im 59/107  soft-tissue]
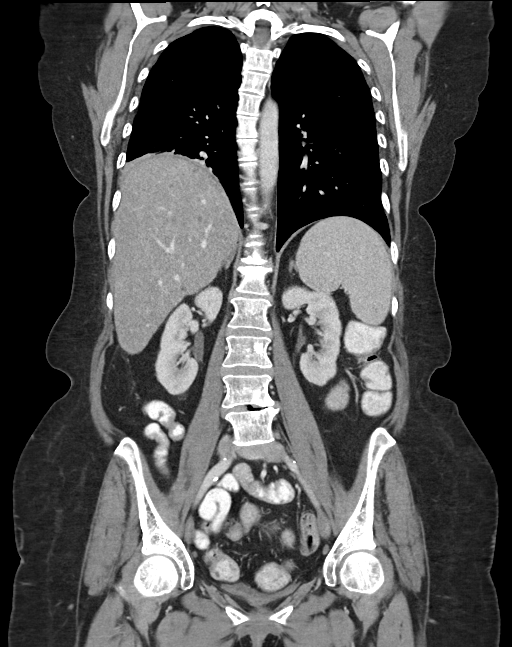

[14 of 46 positions shown; findings below may reference images not displayed]

RADIATION DOSE REDUCTION: This exam was performed according to the
departmental dose-optimization program which includes automated
exposure control, adjustment of the mA and/or kV according to
patient size and/or use of iterative reconstruction technique.

CONTRAST:  100mL OMNIPAQUE IOHEXOL 300 MG/ML SOLN, additional
enteric contrast
FINDINGS: CT CHEST FINDINGS

Cardiovascular: Right chest port catheter. Aortic atherosclerosis.
Normal heart size. No pericardial effusion.

Mediastinum/Nodes: No enlarged mediastinal, hilar, or axillary lymph
nodes. Thyroid gland, trachea, and esophagus demonstrate no
significant findings.

Lungs/Pleura: Interval resolution of a previously seen small left
pleural effusion with minimal residual left-sided pleural thickening
(series 3, image 42). Interval decrease in size of a mass of the
peripheral superior segment left lower lobe, now measuring 1.3 x
cm, previously 2.8 x 2.2 cm (series 5, image 52). Unchanged bandlike
scarring of the right lung base with elevation of the right
hemidiaphragm. Background of very extensive fine centrilobular
nodularity and scattered ground-glass airspace opacity, most
concentrated in the lung apices.

Musculoskeletal: No chest wall mass or suspicious osseous lesions
identified.

CT ABDOMEN PELVIS FINDINGS

Hepatobiliary: Innumerable hypoenhancing lesions throughout the
liver, which are diminished in size, index lesion of the liver dome,
hepatic segment VII measuring 1.1 x 0.9 cm, previously 1.4 x 1.4 cm
(series 3, image 32), additional index lesion of the left lobe of
the liver, hepatic segment III measures 0.6 cm, previously 1.2 x
cm (series 3, image 56). Status post cholecystectomy. No biliary
ductal dilatation.

Pancreas: Unremarkable. No pancreatic ductal dilatation or
surrounding inflammatory changes.

Spleen: Normal in size without significant abnormality.

Adrenals/Urinary Tract: Adrenal glands are unremarkable. Kidneys are
normal, without renal calculi, solid lesion, or hydronephrosis.
Bladder is unremarkable.

Stomach/Bowel: Stomach is within normal limits. Status post right
hemicolectomy with ileocolic anastomosis. No evidence of bowel wall
thickening, distention, or inflammatory changes.

Vascular/Lymphatic: Aortic atherosclerosis. No enlarged abdominal or
pelvic lymph nodes.

Reproductive: Status post hysterectomy.

Other: No abdominal wall hernia or abnormality. No ascites.

Musculoskeletal: No acute osseous findings.
IMPRESSION: 1. Continued decrease in size of a mass of the peripheral superior
segment left lower lobe, with resolution of a previously seen left
pleural effusion. Minimal residual left-sided pleural thickening. No
persistent lymphadenopathy in the chest.
2. Innumerable hypoenhancing lesions throughout the liver are
diminished in size.
3. Findings are consistent with treatment response primary lung
malignancy and metastatic disease.
4. Background of very extensive fine centrilobular nodularity and
scattered ground-glass airspace opacity, most concentrated in the
lung apices. This appearance is most characteristic of
smoking-related respiratory bronchiolitis however is somewhat
increased compared to prior examinations and other nonspecific
infection or inflammation are differential considerations.

Aortic Atherosclerosis ([C9]-[C9]).

## 2022-01-30 MED ORDER — IOHEXOL 300 MG/ML  SOLN
100.0000 mL | Freq: Once | INTRAMUSCULAR | Status: AC | PRN
  Administered 2022-01-30: 100 mL via INTRAVENOUS

## 2022-01-31 ENCOUNTER — Telehealth: Payer: Self-pay

## 2022-01-31 NOTE — Telephone Encounter (Signed)
Advised via MyChart.

## 2022-01-31 NOTE — Telephone Encounter (Signed)
-----   Message from [name], MD sent at 01/31/2022  8:02 AM EDT ----- Call - the cancer is still shrinking!!!  God is awesome!!  Pamela [name]

## 2022-02-01 ENCOUNTER — Encounter: Payer: Self-pay | Admitting: *Deleted

## 2022-02-01 NOTE — Progress Notes (Signed)
Scan continues to show response.   Oncology Nurse Navigator Documentation     02/01/2022    8:00 AM  Oncology Nurse Navigator Flowsheets  Navigator Follow Up Date: 02/04/2022  Navigator Follow Up Reason: Follow-up Appointment;Chemotherapy  Navigator Location [facility]  Navigator Encounter Type Scan Review  Patient Visit Type MedOnc  Treatment Phase Active Tx  Barriers/Navigation Needs Coordination of Care;Education  Interventions None Required  Acuity Level 2-Minimal Needs (1-2 Barriers Identified)  Support Groups/Services Friends and Family  Time Spent with Patient 15

## 2022-02-04 ENCOUNTER — Encounter: Payer: Self-pay | Admitting: *Deleted

## 2022-02-04 ENCOUNTER — Inpatient Hospital Stay: Payer: BC Managed Care – PPO

## 2022-02-04 ENCOUNTER — Encounter: Payer: Self-pay | Admitting: Family

## 2022-02-04 ENCOUNTER — Inpatient Hospital Stay (HOSPITAL_BASED_OUTPATIENT_CLINIC_OR_DEPARTMENT_OTHER): Payer: BC Managed Care – PPO | Admitting: Family

## 2022-02-04 VITALS — BP 111/47 | HR 71 | Temp 98.2°F | Resp 17 | Wt 189.0 lb

## 2022-02-04 DIAGNOSIS — D649 Anemia, unspecified: Secondary | ICD-10-CM

## 2022-02-04 DIAGNOSIS — D509 Iron deficiency anemia, unspecified: Secondary | ICD-10-CM | POA: Diagnosis not present

## 2022-02-04 DIAGNOSIS — C3490 Malignant neoplasm of unspecified part of unspecified bronchus or lung: Secondary | ICD-10-CM

## 2022-02-04 DIAGNOSIS — E878 Other disorders of electrolyte and fluid balance, not elsewhere classified: Secondary | ICD-10-CM

## 2022-02-04 DIAGNOSIS — Z5111 Encounter for antineoplastic chemotherapy: Secondary | ICD-10-CM | POA: Diagnosis not present

## 2022-02-04 LAB — CBC WITH DIFFERENTIAL (CANCER CENTER ONLY)
Abs Immature Granulocytes: 0.43 10*3/uL — ABNORMAL HIGH (ref 0.00–0.07)
Basophils Absolute: 0.1 10*3/uL (ref 0.0–0.1)
Basophils Relative: 1 %
Eosinophils Absolute: 0.1 10*3/uL (ref 0.0–0.5)
Eosinophils Relative: 1 %
HCT: 27.7 % — ABNORMAL LOW (ref 36.0–46.0)
Hemoglobin: 9.5 g/dL — ABNORMAL LOW (ref 12.0–15.0)
Immature Granulocytes: 8 %
Lymphocytes Relative: 30 %
Lymphs Abs: 1.7 10*3/uL (ref 0.7–4.0)
MCH: 33.7 pg (ref 26.0–34.0)
MCHC: 34.3 g/dL (ref 30.0–36.0)
MCV: 98.2 fL (ref 80.0–100.0)
Monocytes Absolute: 0.8 10*3/uL (ref 0.1–1.0)
Monocytes Relative: 14 %
Neutro Abs: 2.6 10*3/uL (ref 1.7–7.7)
Neutrophils Relative %: 46 %
Platelet Count: 53 10*3/uL — ABNORMAL LOW (ref 150–400)
RBC: 2.82 MIL/uL — ABNORMAL LOW (ref 3.87–5.11)
RDW: 15.1 % (ref 11.5–15.5)
WBC Count: 5.6 10*3/uL (ref 4.0–10.5)
nRBC: 0 % (ref 0.0–0.2)

## 2022-02-04 LAB — FERRITIN: Ferritin: 618 ng/mL — ABNORMAL HIGH (ref 11–307)

## 2022-02-04 LAB — IRON AND IRON BINDING CAPACITY (CC-WL,HP ONLY)
Iron: 124 ug/dL (ref 28–170)
Saturation Ratios: 37 % — ABNORMAL HIGH (ref 10.4–31.8)
TIBC: 337 ug/dL (ref 250–450)
UIBC: 213 ug/dL (ref 148–442)

## 2022-02-04 LAB — CMP (CANCER CENTER ONLY)
ALT: 33 U/L (ref 0–44)
AST: 31 U/L (ref 15–41)
Albumin: 4.1 g/dL (ref 3.5–5.0)
Alkaline Phosphatase: 190 U/L — ABNORMAL HIGH (ref 38–126)
Anion gap: 10 (ref 5–15)
BUN: 12 mg/dL (ref 8–23)
CO2: 31 mmol/L (ref 22–32)
Calcium: 8.9 mg/dL (ref 8.9–10.3)
Chloride: 95 mmol/L — ABNORMAL LOW (ref 98–111)
Creatinine: 0.93 mg/dL (ref 0.44–1.00)
GFR, Estimated: >60 mL/min (ref >60)
Glucose, Bld: 199 mg/dL — ABNORMAL HIGH (ref 70–99)
Potassium: 3.3 mmol/L — ABNORMAL LOW (ref 3.5–5.1)
Sodium: 136 mmol/L (ref 135–145)
Total Bilirubin: 0.4 mg/dL (ref 0.3–1.2)
Total Protein: 7 g/dL (ref 6.5–8.1)

## 2022-02-04 LAB — SAMPLE TO BLOOD BANK

## 2022-02-04 LAB — LACTATE DEHYDROGENASE: LDH: 175 U/L (ref 98–192)

## 2022-02-04 MED ORDER — HEPARIN SOD (PORK) LOCK FLUSH 100 UNIT/ML IV SOLN
500.0000 [IU] | Freq: Once | INTRAVENOUS | Status: AC | PRN
  Administered 2022-02-04: 500 [IU]

## 2022-02-04 MED ORDER — SODIUM CHLORIDE 0.9% FLUSH
10.0000 mL | Freq: Once | INTRAVENOUS | Status: AC | PRN
  Administered 2022-02-04: 10 mL

## 2022-02-04 NOTE — Progress Notes (Signed)
Hematology and Oncology Follow Up Visit  Pamela [name] [id number] May 25, 1961 61 y.o. 02/04/2022   Principle Diagnosis:  Extensive stage small cell lung cancer   Current Therapy:        Carboplatinum/etoposide/Tecentriq -1st cycle given in the hospital on 11/05/2021, s/p cycle 4   Interim History:  Pamela [name] is here today with her daughter for follow-up. She is doing well and has no complaints at this time.  Hgb is still low at 9.5, MCV 98, platelets 53 and WBC count 5.6.  She has not noted any obvious blood loss. No abnormal bruising, no petechiae.  No fever, chills, n/v, cough, rash, dizziness, SOB, chest pain, palpitations, abdominal pain or changes in bowel or bladder habits.  No swelling, tenderness, numbness or tingling in her extremities.  No falls or syncope reported.  She states that her appetite is good and she is staying well hydrated. Her weight is stable at 189 lbs.   ECOG Performance Status: 1 - Symptomatic but completely ambulatory  Medications:  Allergies as of 02/04/2022       Reactions   Rofecoxib Swelling        Medication List        Accurate as of Feb 04, 2022 10:38 AM. If you have any questions, ask your nurse or doctor.          albuterol 108 (90 Base) MCG/ACT inhaler Commonly known as: VENTOLIN HFA 2 puffs every 6 (six) hours as needed for wheezing or shortness of breath.   ciprofloxacin 500 MG tablet Commonly known as: Cipro Take 1 tablet (500 mg total) by mouth daily with breakfast.   dexamethasone 4 MG tablet Commonly known as: DECADRON Take 2 tablets (8 mg total) by mouth daily. Start the day after chemotherapy for 1 day.   diclofenac Sodium 1 % Gel Commonly known as: VOLTAREN Apply topically 4 (four) times daily.   esomeprazole 20 MG capsule Commonly known as: NEXIUM Take 20 mg by mouth daily at 12 noon.   fentaNYL 12 MCG/HR Commonly known as: [place] 1 patch onto the skin every 3 (three) days.   furosemide 40 MG  tablet Commonly known as: Lasix Take 1 tablet (40 mg total) by mouth daily.   levothyroxine 125 MCG tablet Commonly known as: SYNTHROID Take 1 tablet (125 mcg total) by mouth daily before breakfast.   LORazepam 0.5 MG tablet Commonly known as: Ativan Take 1 tablet (0.5 mg total) by mouth every 6 (six) hours as needed (Nausea or vomiting).   multivitamin tablet Take 1 tablet by mouth daily.   naloxone 4 MG/0.1ML Liqd nasal spray kit Commonly known as: NARCAN 1 spray as needed (overdose).   ondansetron 8 MG tablet Commonly known as: Zofran Take 1 tablet (8 mg total) by mouth 2 (two) times daily as needed for refractory nausea / vomiting. Start on day 3 after carboplatin chemo.   Oxycodone HCl 10 MG Tabs Take 1 tablet (10 mg total) by mouth every 3 (three) hours as needed (breakthrough pain).   potassium chloride SA 20 MEQ tablet Commonly known as: KLOR-CON M Take 2 tablets (40 mEq total) by mouth 2 (two) times daily.   prochlorperazine 10 MG tablet Commonly known as: COMPAZINE Take 1 tablet (10 mg total) by mouth every 6 (six) hours as needed (Nausea or vomiting).   senna-docusate 8.6-50 MG tablet Commonly known as: Senokot-S Take 1 tablet by mouth 2 (two) times daily.   simvastatin 40 MG tablet Commonly known as: ZOCOR Take 40  mg by mouth every evening.   sitaGLIPtin-metformin 50-500 MG tablet Commonly known as: JANUMET Take 1 tablet by mouth 2 (two) times daily with a meal.        Allergies:  Allergies  Allergen Reactions   Rofecoxib Swelling    Past Medical History, Surgical history, Social history, and Family History were reviewed and updated.  Review of Systems: All other 10 point review of systems is negative.   Physical Exam:  weight is 189 lb (85.7 kg). Her oral temperature is 98.2 F (36.8 C). Her blood pressure is 111/47 (abnormal) and her pulse is 71. Her respiration is 17 and oxygen saturation is 99%.   Wt Readings from Last 3 Encounters:   02/04/22 189 lb (85.7 kg)  01/21/22 190 lb (86.2 kg)  01/15/22 188 lb 6.1 oz (85.5 kg)    Ocular: Sclerae unicteric, pupils equal, round and reactive to light Ear-nose-throat: Oropharynx clear, dentition fair Lymphatic: No cervical or supraclavicular adenopathy Lungs no rales or rhonchi, good excursion bilaterally Heart regular rate and rhythm, no murmur appreciated Abd soft, nontender, positive bowel sounds MSK no focal spinal tenderness, no joint edema Neuro: non-focal, well-oriented, appropriate affect Breasts: Deferred   Lab Results  Component Value Date   WBC 5.6 02/04/2022   HGB 9.5 (L) 02/04/2022   HCT 27.7 (L) 02/04/2022   MCV 98.2 02/04/2022   PLT 53 (L) 02/04/2022   Lab Results  Component Value Date   FERRITIN 290 01/21/2022   IRON 82 01/21/2022   TIBC 358 01/21/2022   UIBC 276 01/21/2022   IRONPCTSAT 23 01/21/2022   Lab Results  Component Value Date   RBC 2.82 (L) 02/04/2022   No results found for: KPAFRELGTCHN, LAMBDASER, KAPLAMBRATIO No results found for: IGGSERUM, IGA, IGMSERUM No results found for: Odetta Pink, SPEI   Chemistry      Component Value Date/Time   NA 136 02/04/2022 0950   K 3.3 (L) 02/04/2022 0950   CL 95 (L) 02/04/2022 0950   CO2 31 02/04/2022 0950   BUN 12 02/04/2022 0950   CREATININE 0.93 02/04/2022 0950      Component Value Date/Time   CALCIUM 8.9 02/04/2022 0950   ALKPHOS 190 (H) 02/04/2022 0950   AST 31 02/04/2022 0950   ALT 33 02/04/2022 0950   BILITOT 0.4 02/04/2022 0950       Impression and Plan: Pamela [name] is a very pleasant 61 yo caucasian female with extensive stage small cell lung cancer.  She continues to do well and her recent CT scan showed a nice response to treatment.  Unfortunately her Hgb and platelet count are still low. Reviewed with Dr. Marin Olp and we will hold treatment this week and recheck nest week and resume treatment at that time if her counts  have improved.   Lottie Dawson, NP 5/22/202310:38 AM

## 2022-02-04 NOTE — Progress Notes (Unsigned)
Patient feels well today, however her counts are too low for treatment. She will return next week.   Oncology Nurse Navigator Documentation     02/04/2022   10:15 AM  Oncology Nurse Navigator Flowsheets  Navigator Follow Up Date: 02/12/2022  Navigator Follow Up Reason: Follow-up Appointment;Chemotherapy  Navigator Location [facility]  Navigator Encounter Type Treatment;Appt/Treatment Plan Review  Patient Visit Type MedOnc  Treatment Phase Active Tx  Barriers/Navigation Needs Coordination of Care;Education  Interventions Psycho-Social Support  Acuity Level 2-Minimal Needs (1-2 Barriers Identified)  Support Groups/Services Friends and Family  Time Spent with Patient 15

## 2022-02-05 ENCOUNTER — Encounter: Payer: Self-pay | Admitting: Hematology & Oncology

## 2022-02-05 ENCOUNTER — Telehealth: Payer: Self-pay | Admitting: *Deleted

## 2022-02-05 NOTE — Telephone Encounter (Signed)
Per 02/04/22 los - called and gave upcoming appointments - confirmed

## 2022-02-12 ENCOUNTER — Encounter: Payer: Self-pay | Admitting: *Deleted

## 2022-02-12 ENCOUNTER — Inpatient Hospital Stay: Payer: BC Managed Care – PPO | Admitting: Family

## 2022-02-12 ENCOUNTER — Inpatient Hospital Stay: Payer: BC Managed Care – PPO

## 2022-02-12 ENCOUNTER — Other Ambulatory Visit: Payer: Self-pay

## 2022-02-12 ENCOUNTER — Encounter: Payer: Self-pay | Admitting: Family

## 2022-02-12 VITALS — BP 122/37 | HR 75 | Temp 97.7°F | Resp 18 | Ht 66.93 in | Wt 191.4 lb

## 2022-02-12 DIAGNOSIS — C3490 Malignant neoplasm of unspecified part of unspecified bronchus or lung: Secondary | ICD-10-CM

## 2022-02-12 DIAGNOSIS — D509 Iron deficiency anemia, unspecified: Secondary | ICD-10-CM

## 2022-02-12 DIAGNOSIS — D649 Anemia, unspecified: Secondary | ICD-10-CM

## 2022-02-12 DIAGNOSIS — Z5111 Encounter for antineoplastic chemotherapy: Secondary | ICD-10-CM | POA: Diagnosis not present

## 2022-02-12 DIAGNOSIS — E878 Other disorders of electrolyte and fluid balance, not elsewhere classified: Secondary | ICD-10-CM

## 2022-02-12 LAB — CBC WITH DIFFERENTIAL (CANCER CENTER ONLY)
Abs Immature Granulocytes: 0.09 10*3/uL — ABNORMAL HIGH (ref 0.00–0.07)
Basophils Absolute: 0 10*3/uL (ref 0.0–0.1)
Basophils Relative: 0 %
Eosinophils Absolute: 0 10*3/uL (ref 0.0–0.5)
Eosinophils Relative: 1 %
HCT: 28.5 % — ABNORMAL LOW (ref 36.0–46.0)
Hemoglobin: 9.7 g/dL — ABNORMAL LOW (ref 12.0–15.0)
Immature Granulocytes: 1 %
Lymphocytes Relative: 21 %
Lymphs Abs: 1.3 10*3/uL (ref 0.7–4.0)
MCH: 34.5 pg — ABNORMAL HIGH (ref 26.0–34.0)
MCHC: 34 g/dL (ref 30.0–36.0)
MCV: 101.4 fL — ABNORMAL HIGH (ref 80.0–100.0)
Monocytes Absolute: 0.9 10*3/uL (ref 0.1–1.0)
Monocytes Relative: 14 %
Neutro Abs: 3.9 10*3/uL (ref 1.7–7.7)
Neutrophils Relative %: 63 %
Platelet Count: 176 10*3/uL (ref 150–400)
RBC: 2.81 MIL/uL — ABNORMAL LOW (ref 3.87–5.11)
RDW: 17.2 % — ABNORMAL HIGH (ref 11.5–15.5)
WBC Count: 6.3 10*3/uL (ref 4.0–10.5)
nRBC: 0 % (ref 0.0–0.2)

## 2022-02-12 LAB — CMP (CANCER CENTER ONLY)
ALT: 36 U/L (ref 0–44)
AST: 39 U/L (ref 15–41)
Albumin: 4.2 g/dL (ref 3.5–5.0)
Alkaline Phosphatase: 158 U/L — ABNORMAL HIGH (ref 38–126)
Anion gap: 10 (ref 5–15)
BUN: 14 mg/dL (ref 8–23)
CO2: 28 mmol/L (ref 22–32)
Calcium: 8.6 mg/dL — ABNORMAL LOW (ref 8.9–10.3)
Chloride: 98 mmol/L (ref 98–111)
Creatinine: 0.85 mg/dL (ref 0.44–1.00)
GFR, Estimated: >60 mL/min (ref >60)
Glucose, Bld: 207 mg/dL — ABNORMAL HIGH (ref 70–99)
Potassium: 3.5 mmol/L (ref 3.5–5.1)
Sodium: 136 mmol/L (ref 135–145)
Total Bilirubin: 0.4 mg/dL (ref 0.3–1.2)
Total Protein: 7.1 g/dL (ref 6.5–8.1)

## 2022-02-12 LAB — SAMPLE TO BLOOD BANK

## 2022-02-12 LAB — LACTATE DEHYDROGENASE: LDH: 190 U/L (ref 98–192)

## 2022-02-12 MED ORDER — PALONOSETRON HCL INJECTION 0.25 MG/5ML
0.2500 mg | Freq: Once | INTRAVENOUS | Status: AC
  Administered 2022-02-12: 0.25 mg via INTRAVENOUS
  Filled 2022-02-12: qty 5

## 2022-02-12 MED ORDER — SODIUM CHLORIDE 0.9 % IV SOLN
1200.0000 mg | Freq: Once | INTRAVENOUS | Status: AC
  Administered 2022-02-12: 1200 mg via INTRAVENOUS
  Filled 2022-02-12: qty 20

## 2022-02-12 MED ORDER — SODIUM CHLORIDE 0.9 % IV SOLN
90.0000 mg/m2 | Freq: Once | INTRAVENOUS | Status: AC
  Administered 2022-02-12: 180 mg via INTRAVENOUS
  Filled 2022-02-12: qty 9

## 2022-02-12 MED ORDER — SODIUM CHLORIDE 0.9 % IV SOLN
150.0000 mg | Freq: Once | INTRAVENOUS | Status: AC
  Administered 2022-02-12: 150 mg via INTRAVENOUS
  Filled 2022-02-12: qty 150

## 2022-02-12 MED ORDER — HEPARIN SOD (PORK) LOCK FLUSH 100 UNIT/ML IV SOLN
500.0000 [IU] | Freq: Once | INTRAVENOUS | Status: AC | PRN
  Administered 2022-02-12: 500 [IU]

## 2022-02-12 MED ORDER — SODIUM CHLORIDE 0.9% FLUSH
10.0000 mL | INTRAVENOUS | Status: DC | PRN
  Administered 2022-02-12: 10 mL

## 2022-02-12 MED ORDER — SODIUM CHLORIDE 0.9% FLUSH
10.0000 mL | Freq: Once | INTRAVENOUS | Status: AC | PRN
  Administered 2022-02-12: 10 mL

## 2022-02-12 MED ORDER — SODIUM CHLORIDE 0.9 % IV SOLN
10.0000 mg | Freq: Once | INTRAVENOUS | Status: AC
  Administered 2022-02-12: 10 mg via INTRAVENOUS
  Filled 2022-02-12: qty 10

## 2022-02-12 MED ORDER — SODIUM CHLORIDE 0.9 % IV SOLN
580.0000 mg | Freq: Once | INTRAVENOUS | Status: AC
  Administered 2022-02-12: 580 mg via INTRAVENOUS
  Filled 2022-02-12: qty 58

## 2022-02-12 MED ORDER — SODIUM CHLORIDE 0.9 % IV SOLN: Freq: Once | INTRAVENOUS | Status: AC

## 2022-02-12 NOTE — Progress Notes (Unsigned)
Patient has improved enough to proceed with cycle five of her treatment. She will need scans again after her next cycle.   Oncology Nurse Navigator Documentation     02/12/2022   11:00 AM  Oncology Nurse Navigator Flowsheets  Navigator Follow Up Date: 03/05/2022  Navigator Follow Up Reason: Follow-up Appointment;Chemotherapy  Navigator Location [facility]  Navigator Encounter Type Treatment;Appt/Treatment Plan Review  Patient Visit Type MedOnc  Treatment Phase Active Tx  Barriers/Navigation Needs Coordination of Care;Education  Interventions Psycho-Social Support  Acuity Level 2-Minimal Needs (1-2 Barriers Identified)  Support Groups/Services Friends and Family  Time Spent with Patient 15

## 2022-02-12 NOTE — Addendum Note (Signed)
Addended by: Burney Gauze R on: 02/12/2022 11:04 AM   Modules accepted: Orders

## 2022-02-12 NOTE — Progress Notes (Signed)
Labs reviewed by MD, ok to treat despite counts.

## 2022-02-12 NOTE — Patient Instructions (Signed)
[facility] [place]  Discharge Instructions: Thank you for choosing [facility] to provide your oncology and hematology care.   If you have a lab appointment with the [facility], please go directly to the [facility] and check in at the registration area.  Wear comfortable clothing and clothing appropriate for easy access to any Portacath or PICC line.   We strive to give you quality time with your provider. You may need to reschedule your appointment if you arrive late (15 or more minutes).  Arriving late affects you and other patients whose appointments are after yours.  Also, if you miss three or more appointments without notifying the office, you may be dismissed from the clinic at the provider's discretion.      For prescription refill requests, have your pharmacy contact our office and allow 72 hours for refills to be completed.    Today you received the following chemotherapy and/or immunotherapy agents Carboplatin, Etoposide and Tecentriq   To help prevent nausea and vomiting after your treatment, we encourage you to take your nausea medication as directed.  BELOW ARE SYMPTOMS THAT SHOULD BE REPORTED IMMEDIATELY: *FEVER GREATER THAN 100.4 F (38 C) OR HIGHER *CHILLS OR SWEATING *NAUSEA AND VOMITING THAT IS NOT CONTROLLED WITH YOUR NAUSEA MEDICATION *UNUSUAL SHORTNESS OF BREATH *UNUSUAL BRUISING OR BLEEDING *URINARY PROBLEMS (pain or burning when urinating, or frequent urination) *BOWEL PROBLEMS (unusual diarrhea, constipation, pain near the anus) TENDERNESS IN MOUTH AND THROAT WITH OR WITHOUT PRESENCE OF ULCERS (sore throat, sores in mouth, or a toothache) UNUSUAL RASH, SWELLING OR PAIN  UNUSUAL VAGINAL DISCHARGE OR ITCHING   Items with * indicate a potential emergency and should be followed up as soon as possible or go to the Emergency Department if any problems should occur.  Please show the CHEMOTHERAPY ALERT CARD or IMMUNOTHERAPY ALERT  CARD at check-in to the Emergency Department and triage nurse. Should you have questions after your visit or need to cancel or reschedule your appointment, please contact [facility]  [phone] and follow the prompts.  Office hours are 8:00 a.m. to 4:30 p.m. Monday - Friday. Please note that voicemails left after 4:00 p.m. may not be returned until the following business day.  We are closed weekends and major holidays. You have access to a nurse at all times for urgent questions. Please call the main number to the clinic [phone] and follow the prompts.  For any non-urgent questions, you may also contact your provider using MyChart. We now offer e-Visits for anyone 70 and older to request care online for non-urgent symptoms. For details visit [email or web address].   Also download the MyChart app! Go to the app store, search "MyChart", open the app, select [facility], and log in with your MyChart username and password.  Due to Covid, a mask is required upon entering the hospital/clinic. If you do not have a mask, one will be given to you upon arrival. For doctor visits, patients may have 1 support person aged 61 or older with them. For treatment visits, patients cannot have anyone with them due to current Covid guidelines and our immunocompromised population.

## 2022-02-12 NOTE — Progress Notes (Signed)
Hematology and Oncology Follow Up Visit  Pamela [name] [id number] March 02, 1961 61 y.o. 02/12/2022   Principle Diagnosis:  Extensive stage small cell lung cancer   Current Therapy:        Carboplatinum/etoposide/Tecentriq -1st cycle given in the hospital on 11/05/2021, s/p cycle 4   Interim History:  Pamela [name] is here today for follow-up and treatment. She is doing well and has no complaints at this time.  Her platelets are improved at 176, Hgb 9.7, MCV 101 and WBC count 6.3.  No blood loss noted. No bruising or petechiae.  No fever, chills, n/v, cough, rash, dizziness, SOB, chest pain, palpitations, abdominal pain or changes in bowel or bladder habits.  No swelling, tenderness, numbness or tingling in her extremities at this time.  No falls or syncope.  Appetite and hydration have been good. Weight is stable at 191 lbs.   ECOG Performance Status: 1 - Symptomatic but completely ambulatory  Medications:  Allergies as of 02/12/2022       Reactions   Rofecoxib Swelling        Medication List        Accurate as of Feb 12, 2022 10:27 AM. If you have any questions, ask your nurse or doctor.          albuterol 108 (90 Base) MCG/ACT inhaler Commonly known as: VENTOLIN HFA 2 puffs every 6 (six) hours as needed for wheezing or shortness of breath.   ciprofloxacin 500 MG tablet Commonly known as: Cipro Take 1 tablet (500 mg total) by mouth daily with breakfast.   dexamethasone 4 MG tablet Commonly known as: DECADRON Take 2 tablets (8 mg total) by mouth daily. Start the day after chemotherapy for 1 day.   diclofenac Sodium 1 % Gel Commonly known as: VOLTAREN Apply topically 4 (four) times daily.   esomeprazole 20 MG capsule Commonly known as: NEXIUM Take 20 mg by mouth daily at 12 noon.   fentaNYL 12 MCG/HR Commonly known as: [place] 1 patch onto the skin every 3 (three) days.   furosemide 40 MG tablet Commonly known as: Lasix Take 1 tablet (40 mg total) by  mouth daily.   levothyroxine 125 MCG tablet Commonly known as: SYNTHROID Take 1 tablet (125 mcg total) by mouth daily before breakfast.   LORazepam 0.5 MG tablet Commonly known as: Ativan Take 1 tablet (0.5 mg total) by mouth every 6 (six) hours as needed (Nausea or vomiting).   multivitamin tablet Take 1 tablet by mouth daily.   naloxone 4 MG/0.1ML Liqd nasal spray kit Commonly known as: NARCAN 1 spray as needed (overdose).   ondansetron 8 MG tablet Commonly known as: Zofran Take 1 tablet (8 mg total) by mouth 2 (two) times daily as needed for refractory nausea / vomiting. Start on day 3 after carboplatin chemo.   Oxycodone HCl 10 MG Tabs Take 1 tablet (10 mg total) by mouth every 3 (three) hours as needed (breakthrough pain).   potassium chloride SA 20 MEQ tablet Commonly known as: KLOR-CON M Take 2 tablets (40 mEq total) by mouth 2 (two) times daily.   prochlorperazine 10 MG tablet Commonly known as: COMPAZINE Take 1 tablet (10 mg total) by mouth every 6 (six) hours as needed (Nausea or vomiting).   senna-docusate 8.6-50 MG tablet Commonly known as: Senokot-S Take 1 tablet by mouth 2 (two) times daily.   simvastatin 40 MG tablet Commonly known as: ZOCOR Take 40 mg by mouth every evening.   sitaGLIPtin-metformin 50-500 MG tablet Commonly  known as: JANUMET Take 1 tablet by mouth 2 (two) times daily with a meal.        Allergies:  Allergies  Allergen Reactions   Rofecoxib Swelling    Past Medical History, Surgical history, Social history, and Family History were reviewed and updated.  Review of Systems: All other 10 point review of systems is negative.   Physical Exam:  height is 5' 6.93" (1.7 m) and weight is 191 lb 6.4 oz (86.8 kg). Her oral temperature is 97.7 F (36.5 C). Her blood pressure is 122/37 (abnormal) and her pulse is 75. Her respiration is 18 and oxygen saturation is 98%.   Wt Readings from Last 3 Encounters:  02/12/22 191 lb 6.4 oz (86.8  kg)  02/04/22 189 lb (85.7 kg)  01/21/22 190 lb (86.2 kg)    Ocular: Sclerae unicteric, pupils equal, round and reactive to light Ear-nose-throat: Oropharynx clear, dentition fair Lymphatic: No cervical or supraclavicular adenopathy Lungs no rales or rhonchi, good excursion bilaterally Heart regular rate and rhythm, no murmur appreciated Abd soft, nontender, positive bowel sounds MSK no focal spinal tenderness, no joint edema Neuro: non-focal, well-oriented, appropriate affect Breasts: Deferred   Lab Results  Component Value Date   WBC 6.3 02/12/2022   HGB 9.7 (L) 02/12/2022   HCT 28.5 (L) 02/12/2022   MCV 101.4 (H) 02/12/2022   PLT 176 02/12/2022   Lab Results  Component Value Date   FERRITIN 618 (H) 02/04/2022   IRON 124 02/04/2022   TIBC 337 02/04/2022   UIBC 213 02/04/2022   IRONPCTSAT 37 (H) 02/04/2022   Lab Results  Component Value Date   RBC 2.81 (L) 02/12/2022   No results found for: KPAFRELGTCHN, LAMBDASER, KAPLAMBRATIO No results found for: IGGSERUM, IGA, IGMSERUM No results found for: Kathrynn Ducking, MSPIKE, SPEI   Chemistry      Component Value Date/Time   NA 136 02/12/2022 0930   K 3.5 02/12/2022 0930   CL 98 02/12/2022 0930   CO2 28 02/12/2022 0930   BUN 14 02/12/2022 0930   CREATININE 0.85 02/12/2022 0930      Component Value Date/Time   CALCIUM 8.6 (L) 02/12/2022 0930   ALKPHOS 158 (H) 02/12/2022 0930   AST 39 02/12/2022 0930   ALT 36 02/12/2022 0930   BILITOT 0.4 02/12/2022 0930       Impression and Plan: Pamela [name] is a very pleasant 61 yo caucasian female with extensive stage small cell lung cancer.  We will proceed with cycle 5 today as planned per MD. We will plan to repeat her next CT scan after cycle 6 (previous scan was on 01/31/2022).  Follow-up in 3 weeks.   Pamela Dawson, NP 5/30/202310:27 AM

## 2022-02-12 NOTE — Patient Instructions (Signed)

## 2022-02-13 ENCOUNTER — Inpatient Hospital Stay: Payer: BC Managed Care – PPO

## 2022-02-13 ENCOUNTER — Ambulatory Visit: Payer: BC Managed Care – PPO

## 2022-02-13 ENCOUNTER — Other Ambulatory Visit: Payer: Self-pay | Admitting: Physician Assistant

## 2022-02-13 VITALS — BP 140/49 | HR 81 | Temp 97.7°F | Resp 18

## 2022-02-13 DIAGNOSIS — Z1231 Encounter for screening mammogram for malignant neoplasm of breast: Secondary | ICD-10-CM

## 2022-02-13 DIAGNOSIS — C3490 Malignant neoplasm of unspecified part of unspecified bronchus or lung: Secondary | ICD-10-CM

## 2022-02-13 DIAGNOSIS — Z5111 Encounter for antineoplastic chemotherapy: Secondary | ICD-10-CM | POA: Diagnosis not present

## 2022-02-13 MED ORDER — SODIUM CHLORIDE 0.9 % IV SOLN
10.0000 mg | Freq: Once | INTRAVENOUS | Status: AC
  Administered 2022-02-13: 10 mg via INTRAVENOUS
  Filled 2022-02-13: qty 10

## 2022-02-13 MED ORDER — SODIUM CHLORIDE 0.9 % IV SOLN
90.0000 mg/m2 | Freq: Once | INTRAVENOUS | Status: AC
  Administered 2022-02-13: 180 mg via INTRAVENOUS
  Filled 2022-02-13: qty 9

## 2022-02-13 MED ORDER — SODIUM CHLORIDE 0.9% FLUSH
10.0000 mL | INTRAVENOUS | Status: DC | PRN
  Administered 2022-02-13: 10 mL

## 2022-02-13 MED ORDER — HEPARIN SOD (PORK) LOCK FLUSH 100 UNIT/ML IV SOLN
500.0000 [IU] | Freq: Once | INTRAVENOUS | Status: AC | PRN
  Administered 2022-02-13: 500 [IU]

## 2022-02-13 MED ORDER — SODIUM CHLORIDE 0.9 % IV SOLN: Freq: Once | INTRAVENOUS | Status: AC

## 2022-02-13 NOTE — Patient Instructions (Signed)
[facility]  Discharge Instructions: Thank you for choosing [facility] to provide your oncology and hematology care.   If you have a lab appointment with the [facility], please go directly to the [facility] and check in at the registration area.  Wear comfortable clothing and clothing appropriate for easy access to any Portacath or PICC line.   We strive to give you quality time with your provider. You may need to reschedule your appointment if you arrive late (15 or more minutes).  Arriving late affects you and other patients whose appointments are after yours.  Also, if you miss three or more appointments without notifying the office, you may be dismissed from the clinic at the provider's discretion.      For prescription refill requests, have your pharmacy contact our office and allow 72 hours for refills to be completed.    Today you received the following chemotherapy and/or immunotherapy agents: Etoposide   To help prevent nausea and vomiting after your treatment, we encourage you to take your nausea medication as directed.  BELOW ARE SYMPTOMS THAT SHOULD BE REPORTED IMMEDIATELY: *FEVER GREATER THAN 100.4 F (38 C) OR HIGHER *CHILLS OR SWEATING *NAUSEA AND VOMITING THAT IS NOT CONTROLLED WITH YOUR NAUSEA MEDICATION *UNUSUAL SHORTNESS OF BREATH *UNUSUAL BRUISING OR BLEEDING *URINARY PROBLEMS (pain or burning when urinating, or frequent urination) *BOWEL PROBLEMS (unusual diarrhea, constipation, pain near the anus) TENDERNESS IN MOUTH AND THROAT WITH OR WITHOUT PRESENCE OF ULCERS (sore throat, sores in mouth, or a toothache) UNUSUAL RASH, SWELLING OR PAIN  UNUSUAL VAGINAL DISCHARGE OR ITCHING   Items with * indicate a potential emergency and should be followed up as soon as possible or go to the Emergency Department if any problems should occur.  Please show the CHEMOTHERAPY ALERT CARD or IMMUNOTHERAPY ALERT CARD at check-in to the  Emergency Department and triage nurse. Should you have questions after your visit or need to cancel or reschedule your appointment, please contact [facility]  [phone] and follow the prompts.  Office hours are 8:00 a.m. to 4:30 p.m. Monday - Friday. Please note that voicemails left after 4:00 p.m. may not be returned until the following business day.  We are closed weekends and major holidays. You have access to a nurse at all times for urgent questions. Please call the main number to the clinic [phone] and follow the prompts.  For any non-urgent questions, you may also contact your provider using MyChart. We now offer e-Visits for anyone 10 and older to request care online for non-urgent symptoms. For details visit [email or web address].   Also download the MyChart app! Go to the app store, search "MyChart", open the app, select [facility], and log in with your MyChart username and password.  Due to Covid, a mask is required upon entering the hospital/clinic. If you do not have a mask, one will be given to you upon arrival. For doctor visits, patients may have 1 support person aged 62 or older with them. For treatment visits, patients cannot have anyone with them due to current Covid guidelines and our immunocompromised population.

## 2022-02-14 ENCOUNTER — Inpatient Hospital Stay: Payer: BC Managed Care – PPO | Attending: Oncology

## 2022-02-14 VITALS — BP 126/55 | HR 65 | Temp 97.7°F | Resp 17

## 2022-02-14 DIAGNOSIS — D649 Anemia, unspecified: Secondary | ICD-10-CM | POA: Diagnosis not present

## 2022-02-14 DIAGNOSIS — C782 Secondary malignant neoplasm of pleura: Secondary | ICD-10-CM | POA: Diagnosis not present

## 2022-02-14 DIAGNOSIS — Z5111 Encounter for antineoplastic chemotherapy: Secondary | ICD-10-CM | POA: Insufficient documentation

## 2022-02-14 DIAGNOSIS — Z5112 Encounter for antineoplastic immunotherapy: Secondary | ICD-10-CM | POA: Diagnosis not present

## 2022-02-14 DIAGNOSIS — C3432 Malignant neoplasm of lower lobe, left bronchus or lung: Secondary | ICD-10-CM | POA: Diagnosis not present

## 2022-02-14 DIAGNOSIS — C787 Secondary malignant neoplasm of liver and intrahepatic bile duct: Secondary | ICD-10-CM | POA: Diagnosis not present

## 2022-02-14 DIAGNOSIS — Z5189 Encounter for other specified aftercare: Secondary | ICD-10-CM | POA: Diagnosis not present

## 2022-02-14 DIAGNOSIS — C3490 Malignant neoplasm of unspecified part of unspecified bronchus or lung: Secondary | ICD-10-CM

## 2022-02-14 MED ORDER — SODIUM CHLORIDE 0.9 % IV SOLN
90.0000 mg/m2 | Freq: Once | INTRAVENOUS | Status: AC
  Administered 2022-02-14: 180 mg via INTRAVENOUS
  Filled 2022-02-14: qty 9

## 2022-02-14 MED ORDER — SODIUM CHLORIDE 0.9 % IV SOLN: Freq: Once | INTRAVENOUS | Status: AC

## 2022-02-14 MED ORDER — SODIUM CHLORIDE 0.9% FLUSH
10.0000 mL | INTRAVENOUS | Status: DC | PRN
  Administered 2022-02-14: 10 mL

## 2022-02-14 MED ORDER — HEPARIN SOD (PORK) LOCK FLUSH 100 UNIT/ML IV SOLN
500.0000 [IU] | Freq: Once | INTRAVENOUS | Status: AC | PRN
  Administered 2022-02-14: 500 [IU]

## 2022-02-14 MED ORDER — SODIUM CHLORIDE 0.9 % IV SOLN
10.0000 mg | Freq: Once | INTRAVENOUS | Status: AC
  Administered 2022-02-14: 10 mg via INTRAVENOUS
  Filled 2022-02-14: qty 10

## 2022-02-14 NOTE — Patient Instructions (Signed)
[facility]  Discharge Instructions: Thank you for choosing [facility] to provide your oncology and hematology care.   If you have a lab appointment with the [facility], please go directly to the [facility] and check in at the registration area.  Wear comfortable clothing and clothing appropriate for easy access to any Portacath or PICC line.   We strive to give you quality time with your provider. You may need to reschedule your appointment if you arrive late (15 or more minutes).  Arriving late affects you and other patients whose appointments are after yours.  Also, if you miss three or more appointments without notifying the office, you may be dismissed from the clinic at the provider's discretion.      For prescription refill requests, have your pharmacy contact our office and allow 72 hours for refills to be completed.    Today you received the following chemotherapy and/or immunotherapy agents: Etoposide   To help prevent nausea and vomiting after your treatment, we encourage you to take your nausea medication as directed.  BELOW ARE SYMPTOMS THAT SHOULD BE REPORTED IMMEDIATELY: *FEVER GREATER THAN 100.4 F (38 C) OR HIGHER *CHILLS OR SWEATING *NAUSEA AND VOMITING THAT IS NOT CONTROLLED WITH YOUR NAUSEA MEDICATION *UNUSUAL SHORTNESS OF BREATH *UNUSUAL BRUISING OR BLEEDING *URINARY PROBLEMS (pain or burning when urinating, or frequent urination) *BOWEL PROBLEMS (unusual diarrhea, constipation, pain near the anus) TENDERNESS IN MOUTH AND THROAT WITH OR WITHOUT PRESENCE OF ULCERS (sore throat, sores in mouth, or a toothache) UNUSUAL RASH, SWELLING OR PAIN  UNUSUAL VAGINAL DISCHARGE OR ITCHING   Items with * indicate a potential emergency and should be followed up as soon as possible or go to the Emergency Department if any problems should occur.  Please show the CHEMOTHERAPY ALERT CARD or IMMUNOTHERAPY ALERT CARD at check-in to the  Emergency Department and triage nurse. Should you have questions after your visit or need to cancel or reschedule your appointment, please contact [facility]  [phone] and follow the prompts.  Office hours are 8:00 a.m. to 4:30 p.m. Monday - Friday. Please note that voicemails left after 4:00 p.m. may not be returned until the following business day.  We are closed weekends and major holidays. You have access to a nurse at all times for urgent questions. Please call the main number to the clinic [phone] and follow the prompts.  For any non-urgent questions, you may also contact your provider using MyChart. We now offer e-Visits for anyone 27 and older to request care online for non-urgent symptoms. For details visit [email or web address].   Also download the MyChart app! Go to the app store, search "MyChart", open the app, select [facility], and log in with your MyChart username and password.  Due to Covid, a mask is required upon entering the hospital/clinic. If you do not have a mask, one will be given to you upon arrival. For doctor visits, patients may have 1 support person aged 62 or older with them. For treatment visits, patients cannot have anyone with them due to current Covid guidelines and our immunocompromised population.

## 2022-02-15 ENCOUNTER — Inpatient Hospital Stay: Payer: BC Managed Care – PPO

## 2022-02-15 ENCOUNTER — Encounter: Payer: Self-pay | Admitting: Hematology & Oncology

## 2022-02-15 DIAGNOSIS — Z5111 Encounter for antineoplastic chemotherapy: Secondary | ICD-10-CM | POA: Diagnosis not present

## 2022-02-15 DIAGNOSIS — C3490 Malignant neoplasm of unspecified part of unspecified bronchus or lung: Secondary | ICD-10-CM

## 2022-02-15 MED ORDER — PEGFILGRASTIM-CBQV 6 MG/0.6ML ~~LOC~~ SOSY
6.0000 mg | PREFILLED_SYRINGE | Freq: Once | SUBCUTANEOUS | Status: AC
  Administered 2022-02-15: 6 mg via SUBCUTANEOUS
  Filled 2022-02-15: qty 0.6

## 2022-03-05 ENCOUNTER — Other Ambulatory Visit: Payer: Self-pay | Admitting: Oncology

## 2022-03-05 ENCOUNTER — Other Ambulatory Visit: Payer: Self-pay

## 2022-03-05 ENCOUNTER — Encounter: Payer: Self-pay | Admitting: *Deleted

## 2022-03-05 ENCOUNTER — Inpatient Hospital Stay: Payer: BC Managed Care – PPO

## 2022-03-05 ENCOUNTER — Other Ambulatory Visit: Payer: Self-pay | Admitting: *Deleted

## 2022-03-05 ENCOUNTER — Telehealth: Payer: Self-pay | Admitting: *Deleted

## 2022-03-05 ENCOUNTER — Inpatient Hospital Stay (HOSPITAL_BASED_OUTPATIENT_CLINIC_OR_DEPARTMENT_OTHER): Payer: BC Managed Care – PPO | Admitting: Hematology & Oncology

## 2022-03-05 VITALS — BP 117/42 | HR 76 | Temp 97.9°F | Resp 18 | Wt 195.2 lb

## 2022-03-05 DIAGNOSIS — C3490 Malignant neoplasm of unspecified part of unspecified bronchus or lung: Secondary | ICD-10-CM

## 2022-03-05 DIAGNOSIS — Z5111 Encounter for antineoplastic chemotherapy: Secondary | ICD-10-CM | POA: Diagnosis not present

## 2022-03-05 LAB — CMP (CANCER CENTER ONLY)
ALT: 47 U/L — ABNORMAL HIGH (ref 0–44)
AST: 41 U/L (ref 15–41)
Albumin: 4.1 g/dL (ref 3.5–5.0)
Alkaline Phosphatase: 191 U/L — ABNORMAL HIGH (ref 38–126)
Anion gap: 11 (ref 5–15)
BUN: 11 mg/dL (ref 8–23)
CO2: 28 mmol/L (ref 22–32)
Calcium: 8.5 mg/dL — ABNORMAL LOW (ref 8.9–10.3)
Chloride: 100 mmol/L (ref 98–111)
Creatinine: 0.91 mg/dL (ref 0.44–1.00)
GFR, Estimated: >60 mL/min (ref >60)
Glucose, Bld: 199 mg/dL — ABNORMAL HIGH (ref 70–99)
Potassium: 3.5 mmol/L (ref 3.5–5.1)
Sodium: 139 mmol/L (ref 135–145)
Total Bilirubin: 0.5 mg/dL (ref 0.3–1.2)
Total Protein: 7 g/dL (ref 6.5–8.1)

## 2022-03-05 LAB — CBC WITH DIFFERENTIAL (CANCER CENTER ONLY)
Abs Immature Granulocytes: 0.1 10*3/uL — ABNORMAL HIGH (ref 0.00–0.07)
Basophils Absolute: 0 10*3/uL (ref 0.0–0.1)
Basophils Relative: 0 %
Eosinophils Absolute: 0 10*3/uL (ref 0.0–0.5)
Eosinophils Relative: 1 %
HCT: 24.5 % — ABNORMAL LOW (ref 36.0–46.0)
Hemoglobin: 8 g/dL — ABNORMAL LOW (ref 12.0–15.0)
Immature Granulocytes: 2 %
Lymphocytes Relative: 21 %
Lymphs Abs: 1.3 10*3/uL (ref 0.7–4.0)
MCH: 34.9 pg — ABNORMAL HIGH (ref 26.0–34.0)
MCHC: 32.7 g/dL (ref 30.0–36.0)
MCV: 107 fL — ABNORMAL HIGH (ref 80.0–100.0)
Monocytes Absolute: 0.9 10*3/uL (ref 0.1–1.0)
Monocytes Relative: 14 %
Neutro Abs: 3.9 10*3/uL (ref 1.7–7.7)
Neutrophils Relative %: 62 %
Platelet Count: 113 10*3/uL — ABNORMAL LOW (ref 150–400)
RBC: 2.29 MIL/uL — ABNORMAL LOW (ref 3.87–5.11)
RDW: 18.4 % — ABNORMAL HIGH (ref 11.5–15.5)
WBC Count: 6.2 10*3/uL (ref 4.0–10.5)
nRBC: 0 % (ref 0.0–0.2)

## 2022-03-05 LAB — LACTATE DEHYDROGENASE: LDH: 242 U/L — ABNORMAL HIGH (ref 98–192)

## 2022-03-05 LAB — SAMPLE TO BLOOD BANK

## 2022-03-05 LAB — PREPARE RBC (CROSSMATCH)

## 2022-03-05 MED ORDER — SODIUM CHLORIDE 0.9 % IV SOLN
566.5000 mg | Freq: Once | INTRAVENOUS | Status: AC
  Administered 2022-03-05: 570 mg via INTRAVENOUS
  Filled 2022-03-05: qty 57

## 2022-03-05 MED ORDER — SODIUM CHLORIDE 0.9 % IV SOLN
150.0000 mg | Freq: Once | INTRAVENOUS | Status: AC
  Administered 2022-03-05: 150 mg via INTRAVENOUS
  Filled 2022-03-05: qty 150

## 2022-03-05 MED ORDER — PALONOSETRON HCL INJECTION 0.25 MG/5ML
0.2500 mg | Freq: Once | INTRAVENOUS | Status: AC
  Administered 2022-03-05: 0.25 mg via INTRAVENOUS
  Filled 2022-03-05: qty 5

## 2022-03-05 MED ORDER — SODIUM CHLORIDE 0.9% FLUSH
10.0000 mL | INTRAVENOUS | Status: DC | PRN
  Administered 2022-03-05: 10 mL

## 2022-03-05 MED ORDER — SODIUM CHLORIDE 0.9 % IV SOLN
1200.0000 mg | Freq: Once | INTRAVENOUS | Status: AC
  Administered 2022-03-05: 1200 mg via INTRAVENOUS
  Filled 2022-03-05: qty 20

## 2022-03-05 MED ORDER — SODIUM CHLORIDE 0.9 % IV SOLN
90.0000 mg/m2 | Freq: Once | INTRAVENOUS | Status: AC
  Administered 2022-03-05: 180 mg via INTRAVENOUS
  Filled 2022-03-05: qty 9

## 2022-03-05 MED ORDER — HEPARIN SOD (PORK) LOCK FLUSH 100 UNIT/ML IV SOLN
500.0000 [IU] | Freq: Once | INTRAVENOUS | Status: AC | PRN
  Administered 2022-03-05: 500 [IU]

## 2022-03-05 MED ORDER — SODIUM CHLORIDE 0.9 % IV SOLN
10.0000 mg | Freq: Once | INTRAVENOUS | Status: AC
  Administered 2022-03-05: 10 mg via INTRAVENOUS
  Filled 2022-03-05: qty 10

## 2022-03-05 MED ORDER — SODIUM CHLORIDE 0.9 % IV SOLN: Freq: Once | INTRAVENOUS | Status: AC

## 2022-03-05 NOTE — Telephone Encounter (Signed)
Hemoglobin 8.0.  2 units blood to be given tomorrow per Dr Marin Olp

## 2022-03-05 NOTE — Progress Notes (Signed)
Patient here for her final chemotherapy cycle. Will likely transition to immunotherapy depending on scan results.   Will need MRI and CTs prior to next appointment. Scheduled for 04/04/22.  Patient is aware of scan appointments including date, time and location. Oral contrast given to patient. She knows to be NPO that morning and to drink her contrast one and two hours prior to her scan. Radiology information sheet also provided to patient with same information for reinforcement.   Oncology Nurse Navigator Documentation     03/05/2022    9:30 AM  Oncology Nurse Navigator Flowsheets  Phase of Treatment Chemo  Chemotherapy Actual End Date: 03/05/2022  Navigator Follow Up Date: 04/04/2022  Navigator Follow Up Reason: Scan Review  Navigator Location [facility]  Navigator Encounter Type Treatment;Appt/Treatment Plan Review  Patient Visit Type MedOnc  Treatment Phase Final Chemo TX  Barriers/Navigation Needs Coordination of Care;Education  Education Other  Interventions Coordination of Care;Education;Psycho-Social Support  Acuity Level 2-Minimal Needs (1-2 Barriers Identified)  Coordination of Care Radiology  Education Method Verbal;Written  Support Groups/Services Friends and Family  Time Spent with Patient 30

## 2022-03-05 NOTE — Addendum Note (Signed)
Addended by: [name] on: 03/05/2022 09:11 AM   Modules accepted: Orders

## 2022-03-05 NOTE — Patient Instructions (Signed)
[facility]  Discharge Instructions: Thank you for choosing [facility] to provide your oncology and hematology care.   If you have a lab appointment with the [facility], please go directly to the [facility] and check in at the registration area.  Wear comfortable clothing and clothing appropriate for easy access to any Portacath or PICC line.   We strive to give you quality time with your provider. You may need to reschedule your appointment if you arrive late (15 or more minutes).  Arriving late affects you and other patients whose appointments are after yours.  Also, if you miss three or more appointments without notifying the office, you may be dismissed from the clinic at the provider's discretion.      For prescription refill requests, have your pharmacy contact our office and allow 72 hours for refills to be completed.    Today you received the following chemotherapy and/or immunotherapy agents Tecentriq, Carboplatin and Etoposide.   To help prevent nausea and vomiting after your treatment, we encourage you to take your nausea medication as directed.  BELOW ARE SYMPTOMS THAT SHOULD BE REPORTED IMMEDIATELY: *FEVER GREATER THAN 100.4 F (38 C) OR HIGHER *CHILLS OR SWEATING *NAUSEA AND VOMITING THAT IS NOT CONTROLLED WITH YOUR NAUSEA MEDICATION *UNUSUAL SHORTNESS OF BREATH *UNUSUAL BRUISING OR BLEEDING *URINARY PROBLEMS (pain or burning when urinating, or frequent urination) *BOWEL PROBLEMS (unusual diarrhea, constipation, pain near the anus) TENDERNESS IN MOUTH AND THROAT WITH OR WITHOUT PRESENCE OF ULCERS (sore throat, sores in mouth, or a toothache) UNUSUAL RASH, SWELLING OR PAIN  UNUSUAL VAGINAL DISCHARGE OR ITCHING   Items with * indicate a potential emergency and should be followed up as soon as possible or go to the Emergency Department if any problems should occur.  Please show the CHEMOTHERAPY ALERT CARD or IMMUNOTHERAPY ALERT  CARD at check-in to the Emergency Department and triage nurse. Should you have questions after your visit or need to cancel or reschedule your appointment, please contact [facility]  [phone] and follow the prompts.  Office hours are 8:00 a.m. to 4:30 p.m. Monday - Friday. Please note that voicemails left after 4:00 p.m. may not be returned until the following business day.  We are closed weekends and major holidays. You have access to a nurse at all times for urgent questions. Please call the main number to the clinic [phone] and follow the prompts.  For any non-urgent questions, you may also contact your provider using MyChart. We now offer e-Visits for anyone 27 and older to request care online for non-urgent symptoms. For details visit [email or web address].   Also download the MyChart app! Go to the app store, search "MyChart", open the app, select [facility], and log in with your MyChart username and password.  Masks are optional in the cancer centers. If you would like for your care team to wear a mask while they are taking care of you, please let them know. For doctor visits, patients may have with them one support person who is at least 62 years old. At this time, visitors are not allowed in the infusion area.

## 2022-03-05 NOTE — Addendum Note (Signed)
Addended by: [name] on: 03/05/2022 09:23 AM   Modules accepted: Orders

## 2022-03-05 NOTE — Progress Notes (Signed)
Hematology and Oncology Follow Up Visit  Pamela [name] [id number] 1961-06-29 61 y.o. 03/05/2022   Principle Diagnosis:  Extensive stage small cell lung cancer   Current Therapy:        Carboplatinum/etoposide/Tecentriq -1st cycle given in the hospital on 11/05/2021, s/p cycle 5   Interim History:  [name] is here today for follow-up and treatment.  She looks quite good.  She feels good.  She had a good weekend.  Surprising, hemoglobin is down to 8.  We have not had to transfuse her for a while.  They are going to have to transfuse her this week.  She has had no problems with cough.  There is no shortness of breath.  She has had no nausea or vomiting.  Again, she is eating quite well.  There is no change in bowel or bladder habits.  There is been no melena or bright red blood per rectum.  The leg swelling seems to have resolved.  She may have little bit of ankle swelling at nighttime but this goes down in the morning.  She has had no headache.  There is no visual changes.  She has had no mouth sores.  Overall, I would say performance status is probably ECOG 1.     Medications:  Allergies as of 03/05/2022       Reactions   Rofecoxib Swelling        Medication List        Accurate as of March 05, 2022  8:17 AM. If you have any questions, ask your nurse or doctor.          albuterol 108 (90 Base) MCG/ACT inhaler Commonly known as: VENTOLIN HFA 2 puffs every 6 (six) hours as needed for wheezing or shortness of breath.   ciprofloxacin 500 MG tablet Commonly known as: Cipro Take 1 tablet (500 mg total) by mouth daily with breakfast.   dexamethasone 4 MG tablet Commonly known as: DECADRON Take 2 tablets (8 mg total) by mouth daily. Start the day after chemotherapy for 1 day.   diclofenac Sodium 1 % Gel Commonly known as: VOLTAREN Apply topically 4 (four) times daily.   esomeprazole 20 MG capsule Commonly known as: NEXIUM Take 20 mg by mouth daily at 12 noon.    fentaNYL 12 MCG/HR Commonly known as: [place] 1 patch onto the skin every 3 (three) days.   furosemide 40 MG tablet Commonly known as: Lasix Take 1 tablet (40 mg total) by mouth daily.   levothyroxine 125 MCG tablet Commonly known as: SYNTHROID Take 1 tablet (125 mcg total) by mouth daily before breakfast.   LORazepam 0.5 MG tablet Commonly known as: Ativan Take 1 tablet (0.5 mg total) by mouth every 6 (six) hours as needed (Nausea or vomiting).   multivitamin tablet Take 1 tablet by mouth daily.   naloxone 4 MG/0.1ML Liqd nasal spray kit Commonly known as: NARCAN 1 spray as needed (overdose).   ondansetron 8 MG tablet Commonly known as: Zofran Take 1 tablet (8 mg total) by mouth 2 (two) times daily as needed for refractory nausea / vomiting. Start on day 3 after carboplatin chemo.   Oxycodone HCl 10 MG Tabs Take 1 tablet (10 mg total) by mouth every 3 (three) hours as needed (breakthrough pain).   potassium chloride SA 20 MEQ tablet Commonly known as: KLOR-CON M Take 2 tablets (40 mEq total) by mouth 2 (two) times daily.   prochlorperazine 10 MG tablet Commonly known as: COMPAZINE Take 1  tablet (10 mg total) by mouth every 6 (six) hours as needed (Nausea or vomiting).   senna-docusate 8.6-50 MG tablet Commonly known as: Senokot-S Take 1 tablet by mouth 2 (two) times daily.   simvastatin 40 MG tablet Commonly known as: ZOCOR Take 40 mg by mouth every evening.   sitaGLIPtin-metformin 50-500 MG tablet Commonly known as: JANUMET Take 1 tablet by mouth 2 (two) times daily with a meal.        Allergies:  Allergies  Allergen Reactions   Rofecoxib Swelling    Past Medical History, Surgical history, Social history, and Family History were reviewed and updated.  Review of Systems: Review of Systems  Constitutional:  Positive for malaise/fatigue.  HENT: Negative.    Eyes: Negative.   Respiratory: Negative.    Cardiovascular: Negative.    Gastrointestinal: Negative.   Genitourinary: Negative.   Musculoskeletal: Negative.   Skin: Negative.   Neurological: Negative.   Endo/Heme/Allergies: Negative.   Psychiatric/Behavioral: Negative.       Physical Exam:  vitals were not taken for this visit.   Wt Readings from Last 3 Encounters:  02/12/22 191 lb 6.4 oz (86.8 kg)  02/04/22 189 lb (85.7 kg)  01/21/22 190 lb (86.2 kg)   Vital signs show temperature of 97.9.  Pulse 74.  Blood pressure 117/42.  Weight is 195 pounds.  Physical Exam Vitals reviewed.  HENT:     Head: Normocephalic and atraumatic.  Eyes:     Pupils: Pupils are equal, round, and reactive to light.  Cardiovascular:     Rate and Rhythm: Normal rate and regular rhythm.     Heart sounds: Normal heart sounds.  Pulmonary:     Effort: Pulmonary effort is normal.     Breath sounds: Normal breath sounds.  Abdominal:     General: Bowel sounds are normal.     Palpations: Abdomen is soft.  Musculoskeletal:        General: No tenderness or deformity. Normal range of motion.     Cervical back: Normal range of motion.  Lymphadenopathy:     Cervical: No cervical adenopathy.  Skin:    General: Skin is warm and dry.     Findings: No erythema or rash.  Neurological:     Mental Status: She is alert and oriented to person, place, and time.  Psychiatric:        Behavior: Behavior normal.        Thought Content: Thought content normal.        Judgment: Judgment normal.      Lab Results  Component Value Date   WBC 6.3 02/12/2022   HGB 9.7 (L) 02/12/2022   HCT 28.5 (L) 02/12/2022   MCV 101.4 (H) 02/12/2022   PLT 176 02/12/2022   Lab Results  Component Value Date   FERRITIN 618 (H) 02/04/2022   IRON 124 02/04/2022   TIBC 337 02/04/2022   UIBC 213 02/04/2022   IRONPCTSAT 37 (H) 02/04/2022   Lab Results  Component Value Date   RBC 2.81 (L) 02/12/2022   No results found for: "KPAFRELGTCHN", "LAMBDASER", "KAPLAMBRATIO" No results found for:  "IGGSERUM", "IGA", "IGMSERUM" No results found for: "TOTALPROTELP", "ALBUMINELP", "A1GS", "A2GS", "BETS", "BETA2SER", "GAMS", "MSPIKE", "SPEI"   Chemistry      Component Value Date/Time   NA 136 02/12/2022 0930   K 3.5 02/12/2022 0930   CL 98 02/12/2022 0930   CO2 28 02/12/2022 0930   BUN 14 02/12/2022 0930   CREATININE 0.85 02/12/2022 0930  Component Value Date/Time   CALCIUM 8.6 (L) 02/12/2022 0930   ALKPHOS 158 (H) 02/12/2022 0930   AST 39 02/12/2022 0930   ALT 36 02/12/2022 0930   BILITOT 0.4 02/12/2022 0930       Impression and Plan: Ms. Jankovich is a very pleasant 60 yo caucasian female with extensive stage small cell lung cancer.   She presented back in February with impending liver failure.  Thankfully, she responded incredibly well to treatment.  She had a very nice response today.  This to be her sixth and final cycle of chemotherapy.  After this cycle, we will see about immunotherapy.  We will have to see what the CT scan looks like.  It is possible that we may have to think about some radiation therapy to the chest where the primary is.  A lot really will depend upon her response.  I do think we need to get an MRI of the brain to make sure thing looks good with the brain.  We will have to transfuse her.  Hemoglobin is 8.  I think a transfusion will certainly help her out.  I would like to plan to get her back to see [name] in about 5 weeks or so.  I want to make sure we get her CAT scans in 4 weeks.  [name], MD 6/20/20238:17 AM

## 2022-03-06 ENCOUNTER — Encounter: Payer: Self-pay | Admitting: *Deleted

## 2022-03-06 ENCOUNTER — Inpatient Hospital Stay: Payer: BC Managed Care – PPO

## 2022-03-06 VITALS — BP 100/43 | HR 78 | Temp 97.8°F | Resp 18

## 2022-03-06 DIAGNOSIS — Z5111 Encounter for antineoplastic chemotherapy: Secondary | ICD-10-CM | POA: Diagnosis not present

## 2022-03-06 DIAGNOSIS — C3490 Malignant neoplasm of unspecified part of unspecified bronchus or lung: Secondary | ICD-10-CM

## 2022-03-06 MED ORDER — SODIUM CHLORIDE 0.9 % IV SOLN
10.0000 mg | Freq: Once | INTRAVENOUS | Status: AC
  Administered 2022-03-06: 10 mg via INTRAVENOUS
  Filled 2022-03-06: qty 10

## 2022-03-06 MED ORDER — SODIUM CHLORIDE 0.9% FLUSH
10.0000 mL | INTRAVENOUS | Status: DC | PRN
  Administered 2022-03-06: 10 mL

## 2022-03-06 MED ORDER — DIPHENHYDRAMINE HCL 25 MG PO CAPS
25.0000 mg | ORAL_CAPSULE | Freq: Once | ORAL | Status: AC
  Administered 2022-03-06: 25 mg via ORAL
  Filled 2022-03-06: qty 1

## 2022-03-06 MED ORDER — HEPARIN SOD (PORK) LOCK FLUSH 100 UNIT/ML IV SOLN
500.0000 [IU] | Freq: Once | INTRAVENOUS | Status: AC | PRN
  Administered 2022-03-06: 500 [IU]

## 2022-03-06 MED ORDER — SODIUM CHLORIDE 0.9 % IV SOLN
90.0000 mg/m2 | Freq: Once | INTRAVENOUS | Status: AC
  Administered 2022-03-06: 180 mg via INTRAVENOUS
  Filled 2022-03-06: qty 9

## 2022-03-06 MED ORDER — SODIUM CHLORIDE 0.9 % IV SOLN: Freq: Once | INTRAVENOUS | Status: AC

## 2022-03-06 MED ORDER — ACETAMINOPHEN 325 MG PO TABS
650.0000 mg | ORAL_TABLET | Freq: Four times a day (QID) | ORAL | Status: DC | PRN
  Administered 2022-03-06: 650 mg via ORAL
  Filled 2022-03-06: qty 2

## 2022-03-06 NOTE — Progress Notes (Signed)
Patient expressed concern about radiation therapy. She states "I went on the Internet and saw all these horrible pictures".  Reviewed Radiation Therapy with the patient and explained that these pictures are usually the exception and do not happen very frequently. Explained that her radiation team will give her skin care recommendations. Also reviewed differences in length of therapy and location. Encouraged her to stay off the Internet as she is more likely to see the more extreme cases instead of the norm. Answered her questions to her satisfaction.   Oncology Nurse Navigator Documentation     03/06/2022    9:15 AM  Oncology Nurse Navigator Flowsheets  Navigator Follow Up Date: 04/04/2022  Navigator Follow Up Reason: Scan Review  Navigator Location [facility]  Navigator Encounter Type Treatment  Patient Visit Type MedOnc  Treatment Phase Active Tx  Barriers/Navigation Needs Coordination of Care;Education  Education Pain/ Symptom Management;Preparing for Upcoming Surgery/ Treatment  Interventions Education;Psycho-Social Support  Acuity Level 2-Minimal Needs (1-2 Barriers Identified)  Education Method Verbal  Support Groups/Services Friends and Family  Time Spent with Patient 15

## 2022-03-06 NOTE — Patient Instructions (Addendum)
[facility]  Discharge Instructions: Thank you for choosing [facility] to provide your oncology and hematology care.   If you have a lab appointment with the [facility], please go directly to the [facility] and check in at the registration area.  Wear comfortable clothing and clothing appropriate for easy access to any Portacath or PICC line.   We strive to give you quality time with your provider. You may need to reschedule your appointment if you arrive late (15 or more minutes).  Arriving late affects you and other patients whose appointments are after yours.  Also, if you miss three or more appointments without notifying the office, you may be dismissed from the clinic at the provider's discretion.      For prescription refill requests, have your pharmacy contact our office and allow 72 hours for refills to be completed.    Today you received the following chemotherapy and/or immunotherapy agents Etoposide.   To help prevent nausea and vomiting after your treatment, we encourage you to take your nausea medication as directed.  BELOW ARE SYMPTOMS THAT SHOULD BE REPORTED IMMEDIATELY: *FEVER GREATER THAN 100.4 F (38 C) OR HIGHER *CHILLS OR SWEATING *NAUSEA AND VOMITING THAT IS NOT CONTROLLED WITH YOUR NAUSEA MEDICATION *UNUSUAL SHORTNESS OF BREATH *UNUSUAL BRUISING OR BLEEDING *URINARY PROBLEMS (pain or burning when urinating, or frequent urination) *BOWEL PROBLEMS (unusual diarrhea, constipation, pain near the anus) TENDERNESS IN MOUTH AND THROAT WITH OR WITHOUT PRESENCE OF ULCERS (sore throat, sores in mouth, or a toothache) UNUSUAL RASH, SWELLING OR PAIN  UNUSUAL VAGINAL DISCHARGE OR ITCHING   Items with * indicate a potential emergency and should be followed up as soon as possible or go to the Emergency Department if any problems should occur.  Please show the CHEMOTHERAPY ALERT CARD or IMMUNOTHERAPY ALERT CARD at check-in to the  Emergency Department and triage nurse. Should you have questions after your visit or need to cancel or reschedule your appointment, please contact [facility]  [phone] and follow the prompts.  Office hours are 8:00 a.m. to 4:30 p.m. Monday - Friday. Please note that voicemails left after 4:00 p.m. may not be returned until the following business day.  We are closed weekends and major holidays. You have access to a nurse at all times for urgent questions. Please call the main number to the clinic [phone] and follow the prompts.  For any non-urgent questions, you may also contact your provider using MyChart. We now offer e-Visits for anyone 76 and older to request care online for non-urgent symptoms. For details visit [email or web address].   Also download the MyChart app! Go to the app store, search "MyChart", open the app, select [facility], and log in with your MyChart username and password.  Masks are optional in the cancer centers. If you would like for your care team to wear a mask while they are taking care of you, please let them know. For doctor visits, patients may have with them one support person who is at least 62 years old. At this time, visitors are not allowed in the infusion area.  Blood Transfusion, Adult, Care After This sheet gives you information about how to care for yourself after your procedure. Your health care provider may also give you more specific instructions. If you have problems or questions, contact your health care provider. What can I expect after the procedure? After the procedure, it is common to have: Bruising  and soreness where the IV was inserted. A headache. Follow these instructions at home: IV insertion site care     Follow instructions from your health care provider about how to take care of your IV insertion site. Make sure you: Wash your hands with soap and water before and after you change your bandage  (dressing). If soap and water are not available, use hand sanitizer. Change your dressing as told by your health care provider. Check your IV insertion site every day for signs of infection. Check for: Redness, swelling, or pain. Bleeding from the site. Warmth. Pus or a bad smell. General instructions Take over-the-counter and prescription medicines only as told by your health care provider. Rest as told by your health care provider. Return to your normal activities as told by your health care provider. Keep all follow-up visits as told by your health care provider. This is important. Contact a health care provider if: You have itching or red, swollen areas of skin (hives). You feel anxious. You feel weak after doing your normal activities. You have redness, swelling, warmth, or pain around the IV insertion site. You have blood coming from the IV insertion site that does not stop with pressure. You have pus or a bad smell coming from your IV insertion site. Get help right away if: You have symptoms of a serious allergic or immune system reaction, including: Trouble breathing or shortness of breath. Swelling of the face or feeling flushed. Fever or chills. Pain in the head, back, or chest. Dark urine or blood in the urine. Widespread rash. Fast heartbeat. Feeling dizzy or light-headed. If you receive your blood transfusion in an outpatient setting, you will be told whom to contact to report any reactions. These symptoms may represent a serious problem that is an emergency. Do not wait to see if the symptoms will go away. Get medical help right away. Call your local emergency services (911 in the [place] Do not drive yourself to the hospital. Summary Bruising and tenderness around the IV insertion site are common. Check your IV insertion site every day for signs of infection. Rest as told by your health care provider. Return to your normal activities as told by your health care  provider. Get help right away for symptoms of a serious allergic or immune system reaction to blood transfusion. This information is not intended to replace advice given to you by your health care provider. Make sure you discuss any questions you have with your health care provider. Document Revised: 12/28/2020 Document Reviewed: 02/25/2019 Elsevier Patient Education  [place].   Blood Transfusion, Adult, Care After This sheet gives you information about how to care for yourself after your procedure. Your health care provider may also give you more specific instructions. If you have problems or questions, contact your health care provider. What can I expect after the procedure? After the procedure, it is common to have: Bruising and soreness where the IV was inserted. A headache. Follow these instructions at home: IV insertion site care     Follow instructions from your health care provider about how to take care of your IV insertion site. Make sure you: Wash your hands with soap and water before and after you change your bandage (dressing). If soap and water are not available, use hand sanitizer. Change your dressing as told by your health care provider. Check your IV insertion site every day for signs of infection. Check for: Redness, swelling, or pain. Bleeding from the site. Warmth.  Pus or a bad smell. General instructions Take over-the-counter and prescription medicines only as told by your health care provider. Rest as told by your health care provider. Return to your normal activities as told by your health care provider. Keep all follow-up visits as told by your health care provider. This is important. Contact a health care provider if: You have itching or red, swollen areas of skin (hives). You feel anxious. You feel weak after doing your normal activities. You have redness, swelling, warmth, or pain around the IV insertion site. You have blood coming from the  IV insertion site that does not stop with pressure. You have pus or a bad smell coming from your IV insertion site. Get help right away if: You have symptoms of a serious allergic or immune system reaction, including: Trouble breathing or shortness of breath. Swelling of the face or feeling flushed. Fever or chills. Pain in the head, back, or chest. Dark urine or blood in the urine. Widespread rash. Fast heartbeat. Feeling dizzy or light-headed. If you receive your blood transfusion in an outpatient setting, you will be told whom to contact to report any reactions. These symptoms may represent a serious problem that is an emergency. Do not wait to see if the symptoms will go away. Get medical help right away. Call your local emergency services (911 in the [place] Do not drive yourself to the hospital. Summary Bruising and tenderness around the IV insertion site are common. Check your IV insertion site every day for signs of infection. Rest as told by your health care provider. Return to your normal activities as told by your health care provider. Get help right away for symptoms of a serious allergic or immune system reaction to blood transfusion. This information is not intended to replace advice given to you by your health care provider. Make sure you discuss any questions you have with your health care provider. Document Revised: 12/28/2020 Document Reviewed: 02/25/2019 Elsevier Patient Education  [place].

## 2022-03-07 ENCOUNTER — Inpatient Hospital Stay: Payer: BC Managed Care – PPO

## 2022-03-07 VITALS — BP 119/46 | HR 77 | Temp 97.8°F | Resp 17

## 2022-03-07 DIAGNOSIS — C3490 Malignant neoplasm of unspecified part of unspecified bronchus or lung: Secondary | ICD-10-CM

## 2022-03-07 DIAGNOSIS — Z5111 Encounter for antineoplastic chemotherapy: Secondary | ICD-10-CM | POA: Diagnosis not present

## 2022-03-07 LAB — TYPE AND SCREEN
ABO/RH(D): O POS
Antibody Screen: NEGATIVE
Unit division: 0
Unit division: 0

## 2022-03-07 LAB — BPAM RBC
Blood Product Expiration Date: 202307172359
Blood Product Expiration Date: 202307172359
ISSUE DATE / TIME: 202306210756
ISSUE DATE / TIME: 202306210756
Unit Type and Rh: 5100
Unit Type and Rh: 5100

## 2022-03-07 MED ORDER — SODIUM CHLORIDE 0.9 % IV SOLN: Freq: Once | INTRAVENOUS | Status: AC

## 2022-03-07 MED ORDER — SODIUM CHLORIDE 0.9 % IV SOLN
90.0000 mg/m2 | Freq: Once | INTRAVENOUS | Status: AC
  Administered 2022-03-07: 180 mg via INTRAVENOUS
  Filled 2022-03-07: qty 2

## 2022-03-07 MED ORDER — SODIUM CHLORIDE 0.9% FLUSH
10.0000 mL | INTRAVENOUS | Status: DC | PRN
  Administered 2022-03-07: 10 mL

## 2022-03-07 MED ORDER — HEPARIN SOD (PORK) LOCK FLUSH 100 UNIT/ML IV SOLN
500.0000 [IU] | Freq: Once | INTRAVENOUS | Status: AC | PRN
  Administered 2022-03-07: 500 [IU]

## 2022-03-07 MED ORDER — SODIUM CHLORIDE 0.9 % IV SOLN
10.0000 mg | Freq: Once | INTRAVENOUS | Status: AC
  Administered 2022-03-07: 10 mg via INTRAVENOUS
  Filled 2022-03-07: qty 10

## 2022-03-07 NOTE — Patient Instructions (Signed)
[facility] [place]  Discharge Instructions: Thank you for choosing [facility] to provide your oncology and hematology care.   If you have a lab appointment with the [facility], please go directly to the [facility] and check in at the registration area.  Wear comfortable clothing and clothing appropriate for easy access to any Portacath or PICC line.   We strive to give you quality time with your provider. You may need to reschedule your appointment if you arrive late (15 or more minutes).  Arriving late affects you and other patients whose appointments are after yours.  Also, if you miss three or more appointments without notifying the office, you may be dismissed from the clinic at the provider's discretion.      For prescription refill requests, have your pharmacy contact our office and allow 72 hours for refills to be completed.    Today you received the following chemotherapy and/or immunotherapy agents: Etoposide   To help prevent nausea and vomiting after your treatment, we encourage you to take your nausea medication as directed.  BELOW ARE SYMPTOMS THAT SHOULD BE REPORTED IMMEDIATELY: *FEVER GREATER THAN 100.4 F (38 C) OR HIGHER *CHILLS OR SWEATING *NAUSEA AND VOMITING THAT IS NOT CONTROLLED WITH YOUR NAUSEA MEDICATION *UNUSUAL SHORTNESS OF BREATH *UNUSUAL BRUISING OR BLEEDING *URINARY PROBLEMS (pain or burning when urinating, or frequent urination) *BOWEL PROBLEMS (unusual diarrhea, constipation, pain near the anus) TENDERNESS IN MOUTH AND THROAT WITH OR WITHOUT PRESENCE OF ULCERS (sore throat, sores in mouth, or a toothache) UNUSUAL RASH, SWELLING OR PAIN  UNUSUAL VAGINAL DISCHARGE OR ITCHING   Items with * indicate a potential emergency and should be followed up as soon as possible or go to the Emergency Department if any problems should occur.  Please show the CHEMOTHERAPY ALERT CARD or IMMUNOTHERAPY ALERT CARD at check-in to the  Emergency Department and triage nurse. Should you have questions after your visit or need to cancel or reschedule your appointment, please contact [facility]  [phone] and follow the prompts.  Office hours are 8:00 a.m. to 4:30 p.m. Monday - Friday. Please note that voicemails left after 4:00 p.m. may not be returned until the following business day.  We are closed weekends and major holidays. You have access to a nurse at all times for urgent questions. Please call the main number to the clinic [phone] and follow the prompts.  For any non-urgent questions, you may also contact your provider using MyChart. We now offer e-Visits for anyone 38 and older to request care online for non-urgent symptoms. For details visit [email or web address].   Also download the MyChart app! Go to the app store, search "MyChart", open the app, select [facility], and log in with your MyChart username and password.  Due to Covid, a mask is required upon entering the hospital/clinic. If you do not have a mask, one will be given to you upon arrival. For doctor visits, patients may have 1 support person aged 35 or older with them. For treatment visits, patients cannot have anyone with them due to current Covid guidelines and our immunocompromised population.   Etoposide, VP-16 injection What is this medication? ETOPOSIDE, VP-16 (e toe POE side) is a chemotherapy drug. It is used to treat testicular cancer, lung cancer, and other cancers. This medicine may be used for other purposes; ask your health care provider or pharmacist if you have questions. COMMON BRAND NAME(S): Etopophos, Toposar, VePesid What should  I tell my care team before I take this medication? They need to know if you have any of these conditions: infection kidney disease liver disease low blood counts, like low white cell, platelet, or red cell counts an unusual or allergic reaction to etoposide, other medicines,  foods, dyes, or preservatives pregnant or trying to get pregnant breast-feeding How should I use this medication? This medicine is for infusion into a vein. It is administered in a hospital or clinic by a specially trained health care professional. Talk to your pediatrician regarding the use of this medicine in children. Special care may be needed. Overdosage: If you think you have taken too much of this medicine contact a poison control center or emergency room at once. NOTE: This medicine is only for you. Do not share this medicine with others. What if I miss a dose? It is important not to miss your dose. Call your doctor or health care professional if you are unable to keep an appointment. What may interact with this medication? This medicine may interact with the following medications: warfarin This list may not describe all possible interactions. Give your health care provider a list of all the medicines, herbs, non-prescription drugs, or dietary supplements you use. Also tell them if you smoke, drink alcohol, or use illegal drugs. Some items may interact with your medicine. What should I watch for while using this medication? Visit your doctor for checks on your progress. This drug may make you feel generally unwell. This is not uncommon, as chemotherapy can affect healthy cells as well as cancer cells. Report any side effects. Continue your course of treatment even though you feel ill unless your doctor tells you to stop. In some cases, you may be given additional medicines to help with side effects. Follow all directions for their use. Call your doctor or health care professional for advice if you get a fever, chills or sore throat, or other symptoms of a cold or flu. Do not treat yourself. This drug decreases your body's ability to fight infections. Try to avoid being around people who are sick. This medicine may increase your risk to bruise or bleed. Call your doctor or health care  professional if you notice any unusual bleeding. Talk to your doctor about your risk of cancer. You may be more at risk for certain types of cancers if you take this medicine. Do not become pregnant while taking this medicine or for at least 6 months after stopping it. Women should inform their doctor if they wish to become pregnant or think they might be pregnant. Women of child-bearing potential will need to have a negative pregnancy test before starting this medicine. There is a potential for serious side effects to an unborn child. Talk to your health care professional or pharmacist for more information. Do not breast-feed an infant while taking this medicine. Men must use a latex condom during sexual contact with a woman while taking this medicine and for at least 4 months after stopping it. A latex condom is needed even if you have had a vasectomy. Contact your doctor right away if your partner becomes pregnant. Do not donate sperm while taking this medicine and for at least 4 months after you stop taking this medicine. Men should inform their doctors if they wish to father a child. This medicine may lower sperm counts. What side effects may I notice from receiving this medication? Side effects that you should report to your doctor or health care professional as  soon as possible: allergic reactions like skin rash, itching or hives, swelling of the face, lips, or tongue low blood counts - this medicine may decrease the number of white blood cells, red blood cells, and platelets. You may be at increased risk for infections and bleeding nausea, vomiting redness, blistering, peeling or loosening of the skin, including inside the mouth signs and symptoms of infection like fever; chills; cough; sore throat; pain or trouble passing urine signs and symptoms of low red blood cells or anemia such as unusually weak or tired; feeling faint or lightheaded; falls; breathing problems unusual bruising or  bleeding Side effects that usually do not require medical attention (report to your doctor or health care professional if they continue or are bothersome): changes in taste diarrhea hair loss loss of appetite mouth sores This list may not describe all possible side effects. Call your doctor for medical advice about side effects. You may report side effects to FDA at 1-800-FDA-1088. Where should I keep my medication? This drug is given in a hospital or clinic and will not be stored at home. NOTE: This sheet is a summary. It may not cover all possible information. If you have questions about this medicine, talk to your doctor, pharmacist, or health care provider.  2023 Elsevier/Gold Standard (2021-08-03 00:00:00)

## 2022-03-08 ENCOUNTER — Inpatient Hospital Stay: Payer: BC Managed Care – PPO

## 2022-03-08 DIAGNOSIS — C3490 Malignant neoplasm of unspecified part of unspecified bronchus or lung: Secondary | ICD-10-CM

## 2022-03-08 DIAGNOSIS — Z5111 Encounter for antineoplastic chemotherapy: Secondary | ICD-10-CM | POA: Diagnosis not present

## 2022-03-08 MED ORDER — PEGFILGRASTIM-CBQV 6 MG/0.6ML ~~LOC~~ SOSY
6.0000 mg | PREFILLED_SYRINGE | Freq: Once | SUBCUTANEOUS | Status: AC
  Administered 2022-03-08: 6 mg via SUBCUTANEOUS
  Filled 2022-03-08: qty 0.6

## 2022-03-11 ENCOUNTER — Ambulatory Visit
Admission: RE | Admit: 2022-03-11 | Discharge: 2022-03-11 | Disposition: A | Discharge status: Home / Self Care | Payer: BC Managed Care – PPO | Source: Ambulatory Visit | Attending: Physician Assistant | Admitting: Physician Assistant

## 2022-03-11 DIAGNOSIS — Z1231 Encounter for screening mammogram for malignant neoplasm of breast: Secondary | ICD-10-CM | POA: Diagnosis present

## 2022-03-22 ENCOUNTER — Encounter: Payer: Self-pay | Admitting: Hematology & Oncology

## 2022-04-03 ENCOUNTER — Other Ambulatory Visit: Payer: Self-pay | Admitting: Pharmacist

## 2022-04-03 NOTE — Progress Notes (Signed)
Carboplatin/Etoposide careplan deleted per Dr. Antonieta Pert instructions. Patient will receive maintenance Tecentriq.

## 2022-04-04 ENCOUNTER — Ambulatory Visit
Admission: RE | Admit: 2022-04-04 | Discharge: 2022-04-04 | Disposition: A | Discharge status: Home / Self Care | Payer: BC Managed Care – PPO | Source: Ambulatory Visit | Attending: Hematology & Oncology | Admitting: Hematology & Oncology

## 2022-04-04 ENCOUNTER — Other Ambulatory Visit: Payer: Self-pay | Admitting: Hematology & Oncology

## 2022-04-04 DIAGNOSIS — C3490 Malignant neoplasm of unspecified part of unspecified bronchus or lung: Secondary | ICD-10-CM | POA: Diagnosis present

## 2022-04-04 MED ORDER — IOHEXOL 300 MG/ML  SOLN
100.0000 mL | Freq: Once | INTRAMUSCULAR | Status: AC | PRN
Start: 2022-04-04 — End: 2022-04-04
  Administered 2022-04-04: 100 mL via INTRAVENOUS

## 2022-04-04 MED ORDER — GADOBUTROL 1 MMOL/ML IV SOLN
8.0000 mL | Freq: Once | INTRAVENOUS | Status: AC | PRN
  Administered 2022-04-04: 8 mL via INTRAVENOUS

## 2022-04-05 ENCOUNTER — Telehealth: Payer: Self-pay | Admitting: *Deleted

## 2022-04-05 NOTE — Telephone Encounter (Signed)
Pt notified per order of Dr. Marin Olp that the CT C/A/P results show that the "chemotherapy is working nicely and that everything is shrinking."  Pt is appreciative of call back and has no questions at this time.

## 2022-04-05 NOTE — Telephone Encounter (Signed)
Pt notified per order of Dr. Marin Olp that "the brain scan looks fantastic. No evidence of any cancer or blood clot or bleed.  Thanks.  Pete"  Pt is appreciative of call and would like to know CT results as well from yesterday.  Informed pt that I would check with Dr Marin Olp and call her back with results.

## 2022-04-05 NOTE — Telephone Encounter (Signed)
-----   Message from [name], MD sent at 04/04/2022  4:33 PM EDT ----- Call and let him know that the brain scan looks fantastic.  No evidence of any cancer or blood clot or bleed.  Thanks.  Pamela [name]

## 2022-04-09 ENCOUNTER — Encounter: Payer: Self-pay | Admitting: *Deleted

## 2022-04-09 NOTE — Progress Notes (Signed)
CT shows continued response to treatment. MRI negative for any met disease.   Oncology Nurse Navigator Documentation     04/09/2022    8:15 AM  Oncology Nurse Navigator Flowsheets  Navigator Follow Up Date: 04/10/2022  Navigator Follow Up Reason: Follow-up Appointment;Scan Review  Navigator Location [facility]  Navigator Encounter Type Scan Review  Patient Visit Type MedOnc  Treatment Phase Active Tx  Barriers/Navigation Needs Coordination of Care;Education  Interventions None Required  Acuity Level 2-Minimal Needs (1-2 Barriers Identified)  Support Groups/Services Friends and Family  Time Spent with Patient 15

## 2022-04-10 ENCOUNTER — Other Ambulatory Visit: Payer: Self-pay

## 2022-04-10 ENCOUNTER — Inpatient Hospital Stay (HOSPITAL_BASED_OUTPATIENT_CLINIC_OR_DEPARTMENT_OTHER): Payer: BC Managed Care – PPO | Admitting: Hematology & Oncology

## 2022-04-10 ENCOUNTER — Inpatient Hospital Stay: Payer: BC Managed Care – PPO

## 2022-04-10 ENCOUNTER — Encounter: Payer: Self-pay | Admitting: *Deleted

## 2022-04-10 ENCOUNTER — Encounter: Payer: Self-pay | Admitting: Hematology & Oncology

## 2022-04-10 ENCOUNTER — Inpatient Hospital Stay: Payer: BC Managed Care – PPO | Attending: Oncology

## 2022-04-10 VITALS — BP 113/45 | HR 79 | Temp 98.0°F | Resp 18 | Ht 66.0 in | Wt 198.0 lb

## 2022-04-10 DIAGNOSIS — Z5112 Encounter for antineoplastic immunotherapy: Secondary | ICD-10-CM | POA: Diagnosis not present

## 2022-04-10 DIAGNOSIS — R739 Hyperglycemia, unspecified: Secondary | ICD-10-CM | POA: Diagnosis not present

## 2022-04-10 DIAGNOSIS — C3490 Malignant neoplasm of unspecified part of unspecified bronchus or lung: Secondary | ICD-10-CM

## 2022-04-10 DIAGNOSIS — E039 Hypothyroidism, unspecified: Secondary | ICD-10-CM

## 2022-04-10 LAB — CBC WITH DIFFERENTIAL (CANCER CENTER ONLY)
Abs Immature Granulocytes: 0.04 10*3/uL (ref 0.00–0.07)
Basophils Absolute: 0 10*3/uL (ref 0.0–0.1)
Basophils Relative: 0 %
Eosinophils Absolute: 0.1 10*3/uL (ref 0.0–0.5)
Eosinophils Relative: 1 %
HCT: 31.5 % — ABNORMAL LOW (ref 36.0–46.0)
Hemoglobin: 10.4 g/dL — ABNORMAL LOW (ref 12.0–15.0)
Immature Granulocytes: 1 %
Lymphocytes Relative: 19 %
Lymphs Abs: 1.3 10*3/uL (ref 0.7–4.0)
MCH: 34.9 pg — ABNORMAL HIGH (ref 26.0–34.0)
MCHC: 33 g/dL (ref 30.0–36.0)
MCV: 105.7 fL — ABNORMAL HIGH (ref 80.0–100.0)
Monocytes Absolute: 0.8 10*3/uL (ref 0.1–1.0)
Monocytes Relative: 11 %
Neutro Abs: 4.8 10*3/uL (ref 1.7–7.7)
Neutrophils Relative %: 68 %
Platelet Count: 171 10*3/uL (ref 150–400)
RBC: 2.98 MIL/uL — ABNORMAL LOW (ref 3.87–5.11)
RDW: 16 % — ABNORMAL HIGH (ref 11.5–15.5)
WBC Count: 7 10*3/uL (ref 4.0–10.5)
nRBC: 0 % (ref 0.0–0.2)

## 2022-04-10 LAB — CMP (CANCER CENTER ONLY)
ALT: 49 U/L — ABNORMAL HIGH (ref 0–44)
AST: 48 U/L — ABNORMAL HIGH (ref 15–41)
Albumin: 4.3 g/dL (ref 3.5–5.0)
Alkaline Phosphatase: 200 U/L — ABNORMAL HIGH (ref 38–126)
Anion gap: 12 (ref 5–15)
BUN: 17 mg/dL (ref 8–23)
CO2: 29 mmol/L (ref 22–32)
Calcium: 9.7 mg/dL (ref 8.9–10.3)
Chloride: 96 mmol/L — ABNORMAL LOW (ref 98–111)
Creatinine: 0.91 mg/dL (ref 0.44–1.00)
GFR, Estimated: >60 mL/min (ref >60)
Glucose, Bld: 250 mg/dL — ABNORMAL HIGH (ref 70–99)
Potassium: 3.7 mmol/L (ref 3.5–5.1)
Sodium: 137 mmol/L (ref 135–145)
Total Bilirubin: 0.5 mg/dL (ref 0.3–1.2)
Total Protein: 7 g/dL (ref 6.5–8.1)

## 2022-04-10 LAB — FERRITIN: Ferritin: 439 ng/mL — ABNORMAL HIGH (ref 11–307)

## 2022-04-10 LAB — IRON AND IRON BINDING CAPACITY (CC-WL,HP ONLY)
Iron: 77 ug/dL (ref 28–170)
Saturation Ratios: 21 % (ref 10.4–31.8)
TIBC: 370 ug/dL (ref 250–450)
UIBC: 293 ug/dL (ref 148–442)

## 2022-04-10 LAB — SAMPLE TO BLOOD BANK

## 2022-04-10 LAB — LACTATE DEHYDROGENASE: LDH: 188 U/L (ref 98–192)

## 2022-04-10 MED ORDER — SODIUM CHLORIDE 0.9 % IV SOLN: Freq: Once | INTRAVENOUS | Status: AC

## 2022-04-10 MED ORDER — SODIUM CHLORIDE 0.9% FLUSH
10.0000 mL | INTRAVENOUS | Status: DC | PRN
  Administered 2022-04-10: 10 mL

## 2022-04-10 MED ORDER — HEPARIN SOD (PORK) LOCK FLUSH 100 UNIT/ML IV SOLN
500.0000 [IU] | Freq: Once | INTRAVENOUS | Status: AC | PRN
  Administered 2022-04-10: 500 [IU]

## 2022-04-10 MED ORDER — INSULIN ASPART 100 UNIT/ML IJ SOLN
15.0000 [IU] | Freq: Once | INTRAMUSCULAR | Status: AC
  Administered 2022-04-10: 15 [IU] via SUBCUTANEOUS
  Filled 2022-04-10: qty 0.15

## 2022-04-10 MED ORDER — SODIUM CHLORIDE 0.9 % IV SOLN
1200.0000 mg | Freq: Once | INTRAVENOUS | Status: AC
  Administered 2022-04-10: 1200 mg via INTRAVENOUS
  Filled 2022-04-10: qty 20

## 2022-04-10 NOTE — Progress Notes (Signed)
Patient is doing well today. She is pleased with her scan results. She is feeling better now that she is completed with chemo. She will start maintenance immunotherapy today.   Oncology Nurse Navigator Documentation     04/10/2022    8:45 AM  Oncology Nurse Navigator Flowsheets  Navigator Follow Up Date: 05/01/2022  Navigator Follow Up Reason: Follow-up Appointment;Chemotherapy  Navigator Location [facility]  Navigator Encounter Type Treatment;Appt/Treatment Plan Review  Patient Visit Type MedOnc  Treatment Phase Active Tx  Barriers/Navigation Needs Coordination of Care;Education  Interventions Psycho-Social Support  Acuity Level 2-Minimal Needs (1-2 Barriers Identified)  Support Groups/Services Friends and Family  Time Spent with Patient 15

## 2022-04-10 NOTE — Progress Notes (Signed)
CBG = 250.  Entering Novolog 15 units per Dr. Antonieta Pert instructions.

## 2022-04-10 NOTE — Progress Notes (Signed)
Hematology and Oncology Follow Up Visit  Pamela [name] [id number] 18-Jul-1961 61 y.o. 04/10/2022   Principle Diagnosis:  Extensive stage small cell lung cancer   Current Therapy:        Carboplatinum/etoposide/Tecentriq -1st cycle given in the hospital on 11/05/2021, s/p cycle 5 Tecentriq - q 3week  -- maintenance - start on 04/10/2022   Interim History:  Pamela [name] is here today for follow-up and treatment.  She had a CT scan that was done.  This showed a continued decrease in her tumor burden.  She really has done nicely.  She had MRI of the brain.  This all look fine.  There is no evidence of brain metastasis.  When we first saw her back in February, she is done amazingly well.  Given the fact that she does not have extensive tumor burden, particular in her liver, I do still think that there is a role for radiation therapy to the chest.  At this point time, I think we [name] go with maintenance therapy with immunotherapy.  I think this would be a very good way to continue to treat her.  She feels good.  Her birthday is on Sunday.  Hopefully, she go to the beach with her family.  Her blood sugars are quite high.  She needs to go back to see her family doctor to have her blood sugars monitored.  She has had no problems with bowels or bladder.  She has had no leg swelling.  She has had no rashes.  There is been no bleeding.  She has had no cough.  There is been no shortness of breath.  She has had no headache.  Currently, I would have to say that her performance status is probably ECOG 1.   Medications:  Allergies as of 04/10/2022       Reactions   Rofecoxib Swelling        Medication List        Accurate as of April 10, 2022  8:58 AM. If you have any questions, ask your nurse or doctor.          albuterol 108 (90 Base) MCG/ACT inhaler Commonly known as: VENTOLIN HFA 2 puffs every 6 (six) hours as needed for wheezing or shortness of breath.   ciprofloxacin 500 MG  tablet Commonly known as: Cipro Take 1 tablet (500 mg total) by mouth daily with breakfast.   diclofenac Sodium 1 % Gel Commonly known as: VOLTAREN Apply topically 4 (four) times daily.   esomeprazole 20 MG capsule Commonly known as: NEXIUM Take 20 mg by mouth daily at 12 noon.   fentaNYL 12 MCG/HR Commonly known as: [place] 1 patch onto the skin every 3 (three) days.   furosemide 40 MG tablet Commonly known as: Lasix Take 1 tablet (40 mg total) by mouth daily.   levothyroxine 125 MCG tablet Commonly known as: SYNTHROID Take 1 tablet (125 mcg total) by mouth daily before breakfast.   multivitamin tablet Take 1 tablet by mouth daily.   naloxone 4 MG/0.1ML Liqd nasal spray kit Commonly known as: NARCAN 1 spray as needed (overdose).   Oxycodone HCl 10 MG Tabs Take 1 tablet (10 mg total) by mouth every 3 (three) hours as needed (breakthrough pain).   potassium chloride SA 20 MEQ tablet Commonly known as: KLOR-CON M TAKE 2 TABLETS BY MOUTH TWICE DAILY   senna-docusate 8.6-50 MG tablet Commonly known as: Senokot-S Take 1 tablet by mouth 2 (two) times daily.   simvastatin  40 MG tablet Commonly known as: ZOCOR Take 40 mg by mouth every evening.   sitaGLIPtin-metformin 50-500 MG tablet Commonly known as: JANUMET Take 1 tablet by mouth 2 (two) times daily with a meal.        Allergies:  Allergies  Allergen Reactions   Rofecoxib Swelling    Past Medical History, Surgical history, Social history, and Family History were reviewed and updated.  Review of Systems: Review of Systems  Constitutional:  Positive for malaise/fatigue.  HENT: Negative.    Eyes: Negative.   Respiratory: Negative.    Cardiovascular: Negative.   Gastrointestinal: Negative.   Genitourinary: Negative.   Musculoskeletal: Negative.   Skin: Negative.   Neurological: Negative.   Endo/Heme/Allergies: Negative.   Psychiatric/Behavioral: Negative.       Physical Exam:  vitals  were not taken for this visit.   Wt Readings from Last 3 Encounters:  03/05/22 195 lb 4 oz (88.6 kg)  02/12/22 191 lb 6.4 oz (86.8 kg)  02/04/22 189 lb (85.7 kg)   Vital signs show temperature of 97.9.  Pulse 74.  Blood pressure 117/42.  Weight is 195 pounds.  Physical Exam Vitals reviewed.  HENT:     Head: Normocephalic and atraumatic.  Eyes:     Pupils: Pupils are equal, round, and reactive to light.  Cardiovascular:     Rate and Rhythm: Normal rate and regular rhythm.     Heart sounds: Normal heart sounds.  Pulmonary:     Effort: Pulmonary effort is normal.     Breath sounds: Normal breath sounds.  Abdominal:     General: Bowel sounds are normal.     Palpations: Abdomen is soft.  Musculoskeletal:        General: No tenderness or deformity. Normal range of motion.     Cervical back: Normal range of motion.  Lymphadenopathy:     Cervical: No cervical adenopathy.  Skin:    General: Skin is warm and dry.     Findings: No erythema or rash.  Neurological:     Mental Status: She is alert and oriented to person, place, and time.  Psychiatric:        Behavior: Behavior normal.        Thought Content: Thought content normal.        Judgment: Judgment normal.      Lab Results  Component Value Date   WBC 6.2 03/05/2022   HGB 8.0 (L) 03/05/2022   HCT 24.5 (L) 03/05/2022   MCV 107.0 (H) 03/05/2022   PLT 113 (L) 03/05/2022   Lab Results  Component Value Date   FERRITIN 618 (H) 02/04/2022   IRON 124 02/04/2022   TIBC 337 02/04/2022   UIBC 213 02/04/2022   IRONPCTSAT 37 (H) 02/04/2022   Lab Results  Component Value Date   RBC 2.29 (L) 03/05/2022   No results found for: "KPAFRELGTCHN", "LAMBDASER", "KAPLAMBRATIO" No results found for: "IGGSERUM", "IGA", "IGMSERUM" No results found for: "TOTALPROTELP", "ALBUMINELP", "A1GS", "A2GS", "BETS", "BETA2SER", "GAMS", "MSPIKE", "SPEI"   Chemistry      Component Value Date/Time   NA 139 03/05/2022 0743   K 3.5 03/05/2022  0743   CL 100 03/05/2022 0743   CO2 28 03/05/2022 0743   BUN 11 03/05/2022 0743   CREATININE 0.91 03/05/2022 0743      Component Value Date/Time   CALCIUM 8.5 (L) 03/05/2022 0743   ALKPHOS 191 (H) 03/05/2022 0743   AST 41 03/05/2022 0743   ALT 47 (H) 03/05/2022 8832  BILITOT 0.5 03/05/2022 0743       Impression and Plan: Ms. Doering is a very pleasant 61 yo caucasian female with extensive stage small cell lung cancer.   She presented back in February with impending liver failure.  Thankfully, she responded incredibly well to treatment.  She had a very nice response today.  We [name] now give her maintenance immunotherapy.  I think this is a very good way of trying to help her.  Hemoglobin is coming up slowly but steadily.  We [name] check her iron studies to see how they look.  I probably would do another CT scan probably in about October.  I think this would be very reasonable.  I do not think we need another MRI probably until the Winter.  Just happy that her quality life is so good I am so happy that she [name] be able to go and enjoy her birthday with her family.  We [name] plan to get her back in 3 weeks for her next cycle of Keytruda.   [name], MD 7/26/20238:58 AM

## 2022-04-10 NOTE — Patient Instructions (Signed)

## 2022-04-10 NOTE — Patient Instructions (Signed)
[facility] [place]  Discharge Instructions: Thank you for choosing [facility] to provide your oncology and hematology care.   If you have a lab appointment with the [facility], please go directly to the [facility] and check in at the registration area.  Wear comfortable clothing and clothing appropriate for easy access to any Portacath or PICC line.   We strive to give you quality time with your provider. You may need to reschedule your appointment if you arrive late (15 or more minutes).  Arriving late affects you and other patients whose appointments are after yours.  Also, if you miss three or more appointments without notifying the office, you may be dismissed from the clinic at the provider's discretion.      For prescription refill requests, have your pharmacy contact our office and allow 72 hours for refills to be completed.    Today you received the following chemotherapy and/or immunotherapy agents Tecentriq      To help prevent nausea and vomiting after your treatment, we encourage you to take your nausea medication as directed.  BELOW ARE SYMPTOMS THAT SHOULD BE REPORTED IMMEDIATELY: *FEVER GREATER THAN 100.4 F (38 C) OR HIGHER *CHILLS OR SWEATING *NAUSEA AND VOMITING THAT IS NOT CONTROLLED WITH YOUR NAUSEA MEDICATION *UNUSUAL SHORTNESS OF BREATH *UNUSUAL BRUISING OR BLEEDING *URINARY PROBLEMS (pain or burning when urinating, or frequent urination) *BOWEL PROBLEMS (unusual diarrhea, constipation, pain near the anus) TENDERNESS IN MOUTH AND THROAT WITH OR WITHOUT PRESENCE OF ULCERS (sore throat, sores in mouth, or a toothache) UNUSUAL RASH, SWELLING OR PAIN  UNUSUAL VAGINAL DISCHARGE OR ITCHING   Items with * indicate a potential emergency and should be followed up as soon as possible or go to the Emergency Department if any problems should occur.  Please show the CHEMOTHERAPY ALERT CARD or IMMUNOTHERAPY ALERT CARD at check-in to the  Emergency Department and triage nurse. Should you have questions after your visit or need to cancel or reschedule your appointment, please contact [facility]  [phone] and follow the prompts.  Office hours are 8:00 a.m. to 4:30 p.m. Monday - Friday. Please note that voicemails left after 4:00 p.m. may not be returned until the following business day.  We are closed weekends and major holidays. You have access to a nurse at all times for urgent questions. Please call the main number to the clinic [phone] and follow the prompts.  For any non-urgent questions, you may also contact your provider using MyChart. We now offer e-Visits for anyone 25 and older to request care online for non-urgent symptoms. For details visit [email or web address].   Also download the MyChart app! Go to the app store, search "MyChart", open the app, select [facility], and log in with your MyChart username and password.  Masks are optional in the cancer centers. If you would like for your care team to wear a mask while they are taking care of you, please let them know. For doctor visits, patients may have with them one support person who is at least 62 years old. At this time, visitors are not allowed in the infusion area.

## 2022-04-10 NOTE — Progress Notes (Signed)
Ok to treat with elevated liver enzymes per Dr Marin Olp. dph

## 2022-04-11 ENCOUNTER — Encounter: Payer: Self-pay | Admitting: Hematology & Oncology

## 2022-05-01 ENCOUNTER — Other Ambulatory Visit: Payer: Self-pay

## 2022-05-01 ENCOUNTER — Inpatient Hospital Stay (HOSPITAL_BASED_OUTPATIENT_CLINIC_OR_DEPARTMENT_OTHER): Payer: BC Managed Care – PPO | Admitting: Hematology & Oncology

## 2022-05-01 ENCOUNTER — Inpatient Hospital Stay: Payer: BC Managed Care – PPO

## 2022-05-01 ENCOUNTER — Inpatient Hospital Stay: Payer: BC Managed Care – PPO | Attending: Oncology

## 2022-05-01 ENCOUNTER — Encounter: Payer: Self-pay | Admitting: Hematology & Oncology

## 2022-05-01 ENCOUNTER — Encounter: Payer: Self-pay | Admitting: *Deleted

## 2022-05-01 ENCOUNTER — Ambulatory Visit (HOSPITAL_BASED_OUTPATIENT_CLINIC_OR_DEPARTMENT_OTHER)
Admission: RE | Admit: 2022-05-01 | Discharge: 2022-05-01 | Disposition: A | Discharge status: Home / Self Care | Payer: BC Managed Care – PPO | Source: Ambulatory Visit | Attending: Hematology & Oncology | Admitting: Hematology & Oncology

## 2022-05-01 ENCOUNTER — Encounter (HOSPITAL_BASED_OUTPATIENT_CLINIC_OR_DEPARTMENT_OTHER): Payer: Self-pay

## 2022-05-01 VITALS — BP 132/52 | HR 87 | Temp 98.7°F | Resp 17 | Ht 66.0 in | Wt 192.1 lb

## 2022-05-01 DIAGNOSIS — I700 Atherosclerosis of aorta: Secondary | ICD-10-CM | POA: Diagnosis not present

## 2022-05-01 DIAGNOSIS — Z5111 Encounter for antineoplastic chemotherapy: Secondary | ICD-10-CM | POA: Insufficient documentation

## 2022-05-01 DIAGNOSIS — R0781 Pleurodynia: Secondary | ICD-10-CM | POA: Insufficient documentation

## 2022-05-01 DIAGNOSIS — R0602 Shortness of breath: Secondary | ICD-10-CM | POA: Diagnosis not present

## 2022-05-01 DIAGNOSIS — E039 Hypothyroidism, unspecified: Secondary | ICD-10-CM

## 2022-05-01 DIAGNOSIS — C787 Secondary malignant neoplasm of liver and intrahepatic bile duct: Secondary | ICD-10-CM | POA: Insufficient documentation

## 2022-05-01 DIAGNOSIS — R590 Localized enlarged lymph nodes: Secondary | ICD-10-CM | POA: Insufficient documentation

## 2022-05-01 DIAGNOSIS — C3490 Malignant neoplasm of unspecified part of unspecified bronchus or lung: Secondary | ICD-10-CM | POA: Diagnosis not present

## 2022-05-01 DIAGNOSIS — R911 Solitary pulmonary nodule: Secondary | ICD-10-CM | POA: Diagnosis not present

## 2022-05-01 DIAGNOSIS — Z85118 Personal history of other malignant neoplasm of bronchus and lung: Secondary | ICD-10-CM | POA: Insufficient documentation

## 2022-05-01 LAB — CMP (CANCER CENTER ONLY)
ALT: 20 U/L (ref 0–44)
AST: 27 U/L (ref 15–41)
Albumin: 4 g/dL (ref 3.5–5.0)
Alkaline Phosphatase: 158 U/L — ABNORMAL HIGH (ref 38–126)
Anion gap: 10 (ref 5–15)
BUN: 12 mg/dL (ref 8–23)
CO2: 28 mmol/L (ref 22–32)
Calcium: 9.6 mg/dL (ref 8.9–10.3)
Chloride: 97 mmol/L — ABNORMAL LOW (ref 98–111)
Creatinine: 0.8 mg/dL (ref 0.44–1.00)
GFR, Estimated: >60 mL/min (ref >60)
Glucose, Bld: 220 mg/dL — ABNORMAL HIGH (ref 70–99)
Potassium: 3.5 mmol/L (ref 3.5–5.1)
Sodium: 135 mmol/L (ref 135–145)
Total Bilirubin: 0.6 mg/dL (ref 0.3–1.2)
Total Protein: 7.6 g/dL (ref 6.5–8.1)

## 2022-05-01 LAB — RETICULOCYTES
Immature Retic Fract: 27.7 % — ABNORMAL HIGH (ref 2.3–15.9)
RBC.: 3.02 MIL/uL — ABNORMAL LOW (ref 3.87–5.11)
Retic Count, Absolute: 55.3 10*3/uL (ref 19.0–186.0)
Retic Ct Pct: 1.8 % (ref 0.4–3.1)

## 2022-05-01 LAB — FERRITIN: Ferritin: 735 ng/mL — ABNORMAL HIGH (ref 11–307)

## 2022-05-01 LAB — CBC WITH DIFFERENTIAL (CANCER CENTER ONLY)
Abs Immature Granulocytes: 0.15 10*3/uL — ABNORMAL HIGH (ref 0.00–0.07)
Basophils Absolute: 0 10*3/uL (ref 0.0–0.1)
Basophils Relative: 0 %
Eosinophils Absolute: 0.1 10*3/uL (ref 0.0–0.5)
Eosinophils Relative: 1 %
HCT: 30.5 % — ABNORMAL LOW (ref 36.0–46.0)
Hemoglobin: 10.1 g/dL — ABNORMAL LOW (ref 12.0–15.0)
Immature Granulocytes: 2 %
Lymphocytes Relative: 14 %
Lymphs Abs: 1.2 10*3/uL (ref 0.7–4.0)
MCH: 33.3 pg (ref 26.0–34.0)
MCHC: 33.1 g/dL (ref 30.0–36.0)
MCV: 100.7 fL — ABNORMAL HIGH (ref 80.0–100.0)
Monocytes Absolute: 0.9 10*3/uL (ref 0.1–1.0)
Monocytes Relative: 10 %
Neutro Abs: 6.4 10*3/uL (ref 1.7–7.7)
Neutrophils Relative %: 73 %
Platelet Count: 173 10*3/uL (ref 150–400)
RBC: 3.03 MIL/uL — ABNORMAL LOW (ref 3.87–5.11)
RDW: 12.5 % (ref 11.5–15.5)
WBC Count: 8.7 10*3/uL (ref 4.0–10.5)
nRBC: 0 % (ref 0.0–0.2)

## 2022-05-01 LAB — IRON AND IRON BINDING CAPACITY (CC-WL,HP ONLY)
Iron: 32 ug/dL (ref 28–170)
Saturation Ratios: 11 % (ref 10.4–31.8)
TIBC: 286 ug/dL (ref 250–450)
UIBC: 254 ug/dL (ref 148–442)

## 2022-05-01 LAB — LACTATE DEHYDROGENASE: LDH: 228 U/L — ABNORMAL HIGH (ref 98–192)

## 2022-05-01 LAB — TSH: TSH: 0.512 u[IU]/mL (ref 0.350–4.500)

## 2022-05-01 MED ORDER — IOHEXOL 350 MG/ML SOLN
100.0000 mL | Freq: Once | INTRAVENOUS | Status: AC | PRN
Start: 2022-05-01 — End: 2022-05-01
  Administered 2022-05-01: 100 mL via INTRAVENOUS

## 2022-05-01 MED ORDER — HEPARIN SOD (PORK) LOCK FLUSH 100 UNIT/ML IV SOLN
500.0000 [IU] | Freq: Once | INTRAVENOUS | Status: AC
  Administered 2022-05-01: 500 [IU] via INTRAVENOUS

## 2022-05-01 MED ORDER — SODIUM CHLORIDE 0.9% FLUSH
10.0000 mL | Freq: Once | INTRAVENOUS | Status: AC
  Administered 2022-05-01: 10 mL via INTRAVENOUS

## 2022-05-01 NOTE — Progress Notes (Signed)
Tecentriq careplan discontinued per Dr. Antonieta Pert instructions.

## 2022-05-01 NOTE — Progress Notes (Signed)
Pharmacist Chemotherapy Monitoring - Initial Assessment    Anticipated start date: 05/06/22   The following has been reviewed per standard work regarding the patient's treatment regimen: The patient's diagnosis, treatment plan and drug doses, and organ/hematologic function Lab orders and baseline tests specific to treatment regimen  The treatment plan start date, drug sequencing, and pre-medications Prior authorization status  Patient's documented medication list, including drug-drug interaction screen and prescriptions for anti-emetics and supportive care specific to the treatment regimen The drug concentrations, fluid compatibility, administration routes, and timing of the medications to be used The patient's access for treatment and lifetime cumulative dose history, if applicable  The patient's medication allergies and previous infusion related reactions, if applicable   Changes made to treatment plan:  N/A  Follow up needed:  Pending authorization for treatment    Claybon Jabs, [facility], 05/01/2022  2:56 PM

## 2022-05-01 NOTE — Progress Notes (Signed)
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 6 Months Therapeutic Status: Relapsed or Progressive Disease Line of Therapy: Second Line Time to Relapse: Relapse ? 6 Months Intent of Therapy: Non-Curative / Palliative Intent, Discussed with Patient

## 2022-05-01 NOTE — Progress Notes (Unsigned)
Patient here for her maintenance immunotherapy, however she c/o increased symptoms including chest pain and SOB. CT was ordered to r/o PE. Scan negative for PE but unfortunately shows progression in her lungs and liver. She will not receive treatment today, and will come back next week for a new regimen.   Oncology Nurse Navigator Documentation     05/01/2022    9:30 AM  Oncology Nurse Navigator Flowsheets  Navigator Follow Up Date: 05/06/2022  Navigator Follow Up Reason: Chemotherapy  Navigator Location [facility]  Navigator Encounter Type Treatment;Appt/Treatment Plan Review;Scan Review  Patient Visit Type MedOnc  Treatment Phase Active Tx  Barriers/Navigation Needs Coordination of Care;Education  Interventions Psycho-Social Support  Acuity Level 2-Minimal Needs (1-2 Barriers Identified)  Support Groups/Services Friends and Family  Time Spent with Patient 15

## 2022-05-01 NOTE — Progress Notes (Signed)
Hematology and Oncology Follow Up Visit  Pamela [name] [id number] 02-Feb-1961 61 y.o. 05/01/2022   Principle Diagnosis:  Extensive stage small cell lung cancer -- relapsed   Current Therapy:        Carboplatinum/etoposide/Tecentriq -1st cycle given in the hospital on 11/05/2021, s/p cycle 6 -- completed in 03/08/2022 Tecentriq - q 3week  -- maintenance - start on 04/10/2022 Lurbinectidin (Zepzelca) -- start cycle #1 on 05/06/2022   Interim History:  [name] is here today for follow-up.  Unfortunately, she been having some breathing difficulties.  She has felt more short of breath.  Her daughter-in-law says that when she walked from the house to the car, she was having loud breathing difficulty.  We did go ahead and do a CT angiogram on her.  I was worried about a pulmonary embolism.  Unfortunately, we found that she had progressive disease.  This is quite unexpected.  She had increasing adenopathy in the mediastinum and hilum.  She had increase in liver mets.  There is only been 2 months since she completed her initial upfront chemoimmunotherapy.  She been on immunotherapy as a maintenance.  Again I have to switch treatments and certainly get her on second line treatment.  We will have to try her on lurbinectedin.  I think this would be considered second line therapy.  I am just incredibly disappointed.  This is certainly going to be very difficult to get her back into some kind of remission.  I think we may have to think about getting a biopsy on her again.  I think we may need to think about getting molecular markers.  I just feel bad for her.  I know she is done everything we have asked her to do.  She is not smoking.  She really has done a tremendous job.  Her appetite is good.  Otherwise, she feels okay.  There is no leg swelling.  There is no abdominal pain.  She has had no change in bowel or bladder habits.  She has had no headache.  Overall, I would have to say that her  performance status is probably ECOG 1.    Medications:  Allergies as of 05/01/2022       Reactions   Rofecoxib Swelling        Medication List        Accurate as of May 01, 2022  9:14 AM. If you have any questions, ask your nurse or doctor.          albuterol 108 (90 Base) MCG/ACT inhaler Commonly known as: VENTOLIN HFA 2 puffs every 6 (six) hours as needed for wheezing or shortness of breath.   ciprofloxacin 500 MG tablet Commonly known as: Cipro Take 1 tablet (500 mg total) by mouth daily with breakfast.   diclofenac Sodium 1 % Gel Commonly known as: VOLTAREN Apply topically 4 (four) times daily.   esomeprazole 20 MG capsule Commonly known as: NEXIUM Take 20 mg by mouth daily at 12 noon.   fentaNYL 12 MCG/HR Commonly known as: [place] 1 patch onto the skin every 3 (three) days.   furosemide 40 MG tablet Commonly known as: Lasix Take 1 tablet (40 mg total) by mouth daily.   levothyroxine 125 MCG tablet Commonly known as: SYNTHROID Take 1 tablet (125 mcg total) by mouth daily before breakfast.   multivitamin tablet Take 1 tablet by mouth daily.   naloxone 4 MG/0.1ML Liqd nasal spray kit Commonly known as: NARCAN 1 spray as needed (  overdose).   Oxycodone HCl 10 MG Tabs Take 1 tablet (10 mg total) by mouth every 3 (three) hours as needed (breakthrough pain).   potassium chloride SA 20 MEQ tablet Commonly known as: KLOR-CON M TAKE 2 TABLETS BY MOUTH TWICE DAILY   senna-docusate 8.6-50 MG tablet Commonly known as: Senokot-S Take 1 tablet by mouth 2 (two) times daily.   simvastatin 40 MG tablet Commonly known as: ZOCOR Take 40 mg by mouth every evening.   sitaGLIPtin-metformin 50-500 MG tablet Commonly known as: JANUMET Take 1 tablet by mouth 2 (two) times daily with a meal.        Allergies:  Allergies  Allergen Reactions   Rofecoxib Swelling    Past Medical History, Surgical history, Social history, and Family History were  reviewed and updated.  Review of Systems: Review of Systems  Constitutional:  Positive for malaise/fatigue.  HENT: Negative.    Eyes: Negative.   Respiratory: Negative.    Cardiovascular: Negative.   Gastrointestinal: Negative.   Genitourinary: Negative.   Musculoskeletal: Negative.   Skin: Negative.   Neurological: Negative.   Endo/Heme/Allergies: Negative.   Psychiatric/Behavioral: Negative.       Physical Exam:  height is _0  (1.676 m) and weight is 192 lb 1.3 oz (87.1 kg). Her oral temperature is 98.7 F (37.1 C). Her blood pressure is 132/52 (abnormal) and her pulse is 87. Her respiration is 17 and oxygen saturation is 94%.   Wt Readings from Last 3 Encounters:  05/01/22 192 lb 1.3 oz (87.1 kg)  04/10/22 198 lb (89.8 kg)  03/05/22 195 lb 4 oz (88.6 kg)   Vital signs show temperature of 97.9.  Pulse 74.  Blood pressure 117/42.  Weight is 195 pounds.  Physical Exam Vitals reviewed.  HENT:     Head: Normocephalic and atraumatic.  Eyes:     Pupils: Pupils are equal, round, and reactive to light.  Cardiovascular:     Rate and Rhythm: Normal rate and regular rhythm.     Heart sounds: Normal heart sounds.  Pulmonary:     Effort: Pulmonary effort is normal.     Breath sounds: Normal breath sounds.  Abdominal:     General: Bowel sounds are normal.     Palpations: Abdomen is soft.  Musculoskeletal:        General: No tenderness or deformity. Normal range of motion.     Cervical back: Normal range of motion.  Lymphadenopathy:     Cervical: No cervical adenopathy.  Skin:    General: Skin is warm and dry.     Findings: No erythema or rash.  Neurological:     Mental Status: She is alert and oriented to person, place, and time.  Psychiatric:        Behavior: Behavior normal.        Thought Content: Thought content normal.        Judgment: Judgment normal.     Lab Results  Component Value Date   WBC 8.7 05/01/2022   HGB 10.1 (L) 05/01/2022   HCT 30.5 (L)  05/01/2022   MCV 100.7 (H) 05/01/2022   PLT 173 05/01/2022   Lab Results  Component Value Date   FERRITIN 439 (H) 04/10/2022   IRON 77 04/10/2022   TIBC 370 04/10/2022   UIBC 293 04/10/2022   IRONPCTSAT 21 04/10/2022   Lab Results  Component Value Date   RETICCTPCT 1.8 05/01/2022   RBC 3.02 (L) 05/01/2022   No results found for: "KPAFRELGTCHN", "LAMBDASER", "KAPLAMBRATIO"  No results found for: "IGGSERUM", "IGA", "IGMSERUM" No results found for: "TOTALPROTELP", "ALBUMINELP", "A1GS", "A2GS", "BETS", "BETA2SER", "GAMS", "MSPIKE", "SPEI"   Chemistry      Component Value Date/Time   NA 135 05/01/2022 0837   K 3.5 05/01/2022 0837   CL 97 (L) 05/01/2022 0837   CO2 28 05/01/2022 0837   BUN 12 05/01/2022 0837   CREATININE 0.80 05/01/2022 0837      Component Value Date/Time   CALCIUM 9.6 05/01/2022 0837   ALKPHOS 158 (H) 05/01/2022 0837   AST 27 05/01/2022 0837   ALT 20 05/01/2022 0837   BILITOT 0.6 05/01/2022 0837       Impression and Plan: Ms. Leske is a very pleasant 61 yo caucasian female with extensive stage small cell lung cancer.  She completed her upfront chemoimmunotherapy.  She did incredibly well.  She had impending liver failure will refer saw her.  She now has relapsed disease.  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  We will try the lurbinectedin and see how this can help her.  I may have to think about an MRI of the brain as a standard follow test.  Again we may have to think about a biopsy so that we can get some fresh tissue for molecular studies.  I know that she will do everything we have asked her to do.  She always has.  We will try to get treatment started next week.  I will plan to see her back when she has her second cycle of treatment.    [name], MD 8/16/20239:14 AM

## 2022-05-01 NOTE — Patient Instructions (Signed)

## 2022-05-01 NOTE — Addendum Note (Signed)
Addended by: Shelda Altes on: 05/01/2022 11:43 AM   Modules accepted: Orders

## 2022-05-02 ENCOUNTER — Encounter: Payer: Self-pay | Admitting: *Deleted

## 2022-05-02 ENCOUNTER — Encounter: Payer: Self-pay | Admitting: Hematology & Oncology

## 2022-05-02 ENCOUNTER — Other Ambulatory Visit: Payer: Self-pay | Admitting: Family

## 2022-05-02 NOTE — Progress Notes (Signed)
Patient's daughter, Pamela [name], called with several questions about her mom's CT scan yesterday and her new treatment planned for this Monday. Answered her questions to her satisfaction. She is encouraged to call if she has any further questions or concerns.   Oncology Nurse Navigator Documentation     05/02/2022    9:30 AM  Oncology Nurse Navigator Flowsheets  Navigator Follow Up Date: 05/06/2022  Navigator Follow Up Reason: Chemotherapy  Navigator Location [facility]  Navigator Encounter Type Telephone  Telephone Education;Diagnostic Results;Incoming Call  Patient Visit Type MedOnc  Treatment Phase Active Tx  Barriers/Navigation Needs Coordination of Engineer, technical sales;Other  Interventions Education;Psycho-Social Support  Acuity Level 2-Minimal Needs (1-2 Barriers Identified)  Education Method Verbal  Support Groups/Services Friends and Family  Time Spent with Patient 40

## 2022-05-06 ENCOUNTER — Inpatient Hospital Stay: Payer: BC Managed Care – PPO

## 2022-05-06 VITALS — BP 126/42 | HR 72 | Temp 98.0°F | Resp 18 | Ht 66.0 in | Wt 191.1 lb

## 2022-05-06 DIAGNOSIS — Z5111 Encounter for antineoplastic chemotherapy: Secondary | ICD-10-CM | POA: Diagnosis present

## 2022-05-06 DIAGNOSIS — C3490 Malignant neoplasm of unspecified part of unspecified bronchus or lung: Secondary | ICD-10-CM

## 2022-05-06 DIAGNOSIS — C787 Secondary malignant neoplasm of liver and intrahepatic bile duct: Secondary | ICD-10-CM | POA: Diagnosis not present

## 2022-05-06 DIAGNOSIS — R590 Localized enlarged lymph nodes: Secondary | ICD-10-CM | POA: Diagnosis not present

## 2022-05-06 MED ORDER — PALONOSETRON HCL INJECTION 0.25 MG/5ML
0.2500 mg | Freq: Once | INTRAVENOUS | Status: AC
  Administered 2022-05-06: 0.25 mg via INTRAVENOUS
  Filled 2022-05-06: qty 5

## 2022-05-06 MED ORDER — ONDANSETRON HCL 8 MG PO TABS: 8.0000 mg | ORAL_TABLET | Freq: Three times a day (TID) | ORAL | 1 refills | Status: DC | PRN

## 2022-05-06 MED ORDER — SODIUM CHLORIDE 0.9 % IV SOLN
10.0000 mg | Freq: Once | INTRAVENOUS | Status: AC
  Administered 2022-05-06: 10 mg via INTRAVENOUS
  Filled 2022-05-06: qty 10

## 2022-05-06 MED ORDER — LORAZEPAM 2 MG/ML IJ SOLN
0.5000 mg | Freq: Once | INTRAMUSCULAR | Status: AC
  Administered 2022-05-06: 0.5 mg via INTRAVENOUS
  Filled 2022-05-06: qty 1

## 2022-05-06 MED ORDER — SODIUM CHLORIDE 0.9 % IV SOLN
3.2000 mg/m2 | Freq: Once | INTRAVENOUS | Status: AC
  Administered 2022-05-06: 6.45 mg via INTRAVENOUS
  Filled 2022-05-06: qty 12.9

## 2022-05-06 MED ORDER — SODIUM CHLORIDE 0.9% FLUSH
10.0000 mL | INTRAVENOUS | Status: DC | PRN
  Administered 2022-05-06: 10 mL

## 2022-05-06 MED ORDER — DEXAMETHASONE 4 MG PO TABS: 8.0000 mg | ORAL_TABLET | Freq: Every day | ORAL | 1 refills | Status: DC

## 2022-05-06 MED ORDER — LIDOCAINE-PRILOCAINE 2.5-2.5 % EX CREA: TOPICAL_CREAM | CUTANEOUS | 3 refills | Status: AC

## 2022-05-06 MED ORDER — SODIUM CHLORIDE 0.9 % IV SOLN: Freq: Once | INTRAVENOUS | Status: AC

## 2022-05-06 MED ORDER — PROCHLORPERAZINE MALEATE 10 MG PO TABS: 10.0000 mg | ORAL_TABLET | Freq: Four times a day (QID) | ORAL | 1 refills | Status: DC | PRN

## 2022-05-06 MED ORDER — HEPARIN SOD (PORK) LOCK FLUSH 100 UNIT/ML IV SOLN
500.0000 [IU] | Freq: Once | INTRAVENOUS | Status: AC | PRN
  Administered 2022-05-06: 500 [IU]

## 2022-05-06 NOTE — Progress Notes (Signed)
New treatment, pt has mild anxiety . VO from MD Ativan 0.5mg  once.

## 2022-05-06 NOTE — Patient Instructions (Signed)
[name]  Discharge Instructions: Thank you for choosing [facility] to provide your oncology and hematology care.   If you have a lab appointment with the [facility], please go directly to the [facility] and check in at the registration area.  Wear comfortable clothing and clothing appropriate for easy access to any Portacath or PICC line.   We strive to give you quality time with your provider. You may need to reschedule your appointment if you arrive late (15 or more minutes).  Arriving late affects you and other patients whose appointments are after yours.  Also, if you miss three or more appointments without notifying the office, you may be dismissed from the clinic at the provider's discretion.      For prescription refill requests, have your pharmacy contact our office and allow 72 hours for refills to be completed.    Today you received the following chemotherapy and/or immunotherapy agents Zepzelca      To help prevent nausea and vomiting after your treatment, we encourage you to take your nausea medication as directed.  BELOW ARE SYMPTOMS THAT SHOULD BE REPORTED IMMEDIATELY: *FEVER GREATER THAN 100.4 F (38 C) OR HIGHER *CHILLS OR SWEATING *NAUSEA AND VOMITING THAT IS NOT CONTROLLED WITH YOUR NAUSEA MEDICATION *UNUSUAL SHORTNESS OF BREATH *UNUSUAL BRUISING OR BLEEDING *URINARY PROBLEMS (pain or burning when urinating, or frequent urination) *BOWEL PROBLEMS (unusual diarrhea, constipation, pain near the anus) TENDERNESS IN MOUTH AND THROAT WITH OR WITHOUT PRESENCE OF ULCERS (sore throat, sores in mouth, or a toothache) UNUSUAL RASH, SWELLING OR PAIN  UNUSUAL VAGINAL DISCHARGE OR ITCHING   Items with * indicate a potential emergency and should be followed up as soon as possible or go to the Emergency Department if any problems should occur.  Please show the CHEMOTHERAPY ALERT CARD or IMMUNOTHERAPY ALERT CARD at check-in to the  Emergency Department and triage nurse. Should you have questions after your visit or need to cancel or reschedule your appointment, please contact [facility]  [phone] and follow the prompts.  Office hours are 8:00 a.m. to 4:30 p.m. Monday - Friday. Please note that voicemails left after 4:00 p.m. may not be returned until the following business day.  We are closed weekends and major holidays. You have access to a nurse at all times for urgent questions. Please call the main number to the clinic [phone] and follow the prompts.  For any non-urgent questions, you may also contact your provider using MyChart. We now offer e-Visits for anyone 40 and older to request care online for non-urgent symptoms. For details visit [email or web address].   Also download the MyChart app! Go to the app store, search "MyChart", open the app, select [facility], and log in with your MyChart username and password.  Masks are optional in the cancer centers. If you would like for your care team to wear a mask while they are taking care of you, please let them know. You may have one support person who is at least 62 years old accompany you for your appointments.  Lurbinectedin Injection What is this medication? LURBINECTEDIN (LOOR bin EK te din) treats lung cancer. It works by slowing down the growth of cancer cells. This medicine may be used for other purposes; ask your health care provider or pharmacist if you have questions. COMMON BRAND NAME(S): ZEPZELCA What should I tell my care team before I take this medication? They need to know if  you have any of these conditions: Liver disease Low blood cell levels, such as low white cells, platelets, red blood cells An unusual or allergic reaction to lurbinectedin, other medications, foods, dyes, or preservatives If you or your partner are pregnant or trying to get pregnant Breastfeeding How should I use this medication? This  medication is injected into a vein. It is given by your care team in a hospital or clinic setting. Talk to your care team about the use of this medication in children. Special care may be needed. Overdosage: If you think you have taken too much of this medicine contact a poison control center or emergency room at once. NOTE: This medicine is only for you. Do not share this medicine with others. What if I miss a dose? Keep appointments for follow-up doses. It is important not to miss your dose. Call your care team if you are unable to keep an appointment. What may interact with this medication? Certain antibiotics, such as erythromycin, clarithromycin Certain antivirals for HIV or hepatitis Certain medications for fungal infections, such as ketoconazole, itraconazole, posaconazole Certain medications for seizures, such as carbamazepine, phenobarbital, phenytoin Grapefruit or grapefruit juice St. John's wort This list may not describe all possible interactions. Give your health care provider a list of all the medicines, herbs, non-prescription drugs, or dietary supplements you use. Also tell them if you smoke, drink alcohol, or use illegal drugs. Some items may interact with your medicine. What should I watch for while using this medication? Your condition will be monitored carefully while you are receiving this medication. This medication may make you feel generally unwell. This is not uncommon as chemotherapy can affect healthy cells as well as cancer cells. Report any side effects. Continue your course of treatment even though you feel ill unless your care team tells you to stop. This medication may increase your risk of getting an infection. Call your care team for advice if you get a fever, chills, sore throat, or other symptoms of a cold or flu. Do not treat yourself. Try to avoid being around people who are sick. Avoid taking medications that contain aspirin, acetaminophen, ibuprofen,  naproxen, or ketoprofen unless instructed by your care team. These medications may hide a fever. Be careful brushing or flossing your teeth or using a toothpick because you may get an infection or bleed more easily. If you have any dental work done, tell your dentist you are receiving this medication. Talk to your care team if you may be pregnant. Serious birth defects can occur if you take this medication during pregnancy and for 6 months after the last dose. You will need a negative pregnancy test before starting this medication. Contraception is recommended while taking this medication and for 6 months after the last dose. If your partner can get pregnant, use a condom during sex while taking this medication and for 4 months after the last dose. Do not breastfeed while taking this medication and for 2 weeks after the last dose. What side effects may I notice from receiving this medication? Side effects that you should report to your care team as soon as possible: Allergic reactions--skin rash, itching, hives, swelling of the face, lips, tongue, or throat Infection--fever, chills, cough, sore throat, wounds that don't heal, pain or trouble when passing urine, general feeling of discomfort or being unwell Liver injury--right upper belly pain, loss of appetite, nausea, light-colored stool, dark yellow or brown urine, yellowing skin or eyes, unusual weakness or fatigue Low red blood  cell level--unusual weakness or fatigue, dizziness, headache, trouble breathing Muscle injury--unusual weakness or fatigue, muscle pain, dark yellow or brown urine, decrease in amount of urine Painful swelling, warmth, or redness of the skin, blisters or sores at the infusion site Unusual bruising or bleeding Side effects that usually do not require medical attention (report these to your care team if they continue or are bothersome): Constipation Cough Diarrhea Fatigue Loss of appetite Nausea This list may not  describe all possible side effects. Call your doctor for medical advice about side effects. You may report side effects to FDA at 1-800-FDA-1088. Where should I keep my medication? This medication is given in a hospital or clinic. It will not be stored at home. NOTE: This sheet is a summary. It may not cover all possible information. If you have questions about this medicine, talk to your doctor, pharmacist, or health care provider.  2023 Elsevier/Gold Standard (2022-01-22 00:00:00)

## 2022-05-07 ENCOUNTER — Encounter: Payer: Self-pay | Admitting: *Deleted

## 2022-05-07 NOTE — Progress Notes (Signed)
Oncology Nurse Navigator Documentation     05/07/2022    1:15 PM  Oncology Nurse Navigator Flowsheets  Navigator Follow Up Date: 05/27/2022  Navigator Follow Up Reason: Follow-up Appointment;Chemotherapy  Navigator Location [facility]  Navigator Encounter Type Appt/Treatment Plan Review  Patient Visit Type MedOnc  Treatment Phase Active Tx  Barriers/Navigation Needs Coordination of Care;Education  Interventions None Required  Acuity Level 2-Minimal Needs (1-2 Barriers Identified)  Support Groups/Services Friends and Family  Time Spent with Patient 15

## 2022-05-17 ENCOUNTER — Other Ambulatory Visit: Payer: Self-pay | Admitting: Hematology & Oncology

## 2022-05-17 DIAGNOSIS — C3490 Malignant neoplasm of unspecified part of unspecified bronchus or lung: Secondary | ICD-10-CM

## 2022-05-17 DIAGNOSIS — E039 Hypothyroidism, unspecified: Secondary | ICD-10-CM

## 2022-05-17 DIAGNOSIS — E119 Type 2 diabetes mellitus without complications: Secondary | ICD-10-CM

## 2022-05-22 ENCOUNTER — Inpatient Hospital Stay: Payer: BC Managed Care – PPO | Admitting: Hematology & Oncology

## 2022-05-22 ENCOUNTER — Inpatient Hospital Stay: Payer: BC Managed Care – PPO

## 2022-05-27 ENCOUNTER — Inpatient Hospital Stay: Payer: BC Managed Care – PPO | Attending: Oncology

## 2022-05-27 ENCOUNTER — Inpatient Hospital Stay: Payer: BC Managed Care – PPO

## 2022-05-27 ENCOUNTER — Inpatient Hospital Stay: Payer: BC Managed Care – PPO | Admitting: Family

## 2022-05-27 ENCOUNTER — Encounter: Payer: Self-pay | Admitting: *Deleted

## 2022-05-27 ENCOUNTER — Encounter: Payer: Self-pay | Admitting: Family

## 2022-05-27 VITALS — BP 127/71 | HR 78 | Temp 97.7°F | Resp 17 | Ht 66.0 in | Wt 197.0 lb

## 2022-05-27 DIAGNOSIS — D509 Iron deficiency anemia, unspecified: Secondary | ICD-10-CM | POA: Diagnosis not present

## 2022-05-27 DIAGNOSIS — C3490 Malignant neoplasm of unspecified part of unspecified bronchus or lung: Secondary | ICD-10-CM | POA: Insufficient documentation

## 2022-05-27 DIAGNOSIS — E039 Hypothyroidism, unspecified: Secondary | ICD-10-CM | POA: Diagnosis not present

## 2022-05-27 DIAGNOSIS — Z5111 Encounter for antineoplastic chemotherapy: Secondary | ICD-10-CM | POA: Insufficient documentation

## 2022-05-27 DIAGNOSIS — Z79899 Other long term (current) drug therapy: Secondary | ICD-10-CM | POA: Insufficient documentation

## 2022-05-27 LAB — CMP (CANCER CENTER ONLY)
ALT: 56 U/L — ABNORMAL HIGH (ref 0–44)
AST: 49 U/L — ABNORMAL HIGH (ref 15–41)
Albumin: 3.9 g/dL (ref 3.5–5.0)
Alkaline Phosphatase: 184 U/L — ABNORMAL HIGH (ref 38–126)
Anion gap: 9 (ref 5–15)
BUN: 11 mg/dL (ref 8–23)
CO2: 30 mmol/L (ref 22–32)
Calcium: 9.3 mg/dL (ref 8.9–10.3)
Chloride: 100 mmol/L (ref 98–111)
Creatinine: 0.85 mg/dL (ref 0.44–1.00)
GFR, Estimated: >60 mL/min (ref >60)
Glucose, Bld: 213 mg/dL — ABNORMAL HIGH (ref 70–99)
Potassium: 3.6 mmol/L (ref 3.5–5.1)
Sodium: 139 mmol/L (ref 135–145)
Total Bilirubin: 0.4 mg/dL (ref 0.3–1.2)
Total Protein: 6.5 g/dL (ref 6.5–8.1)

## 2022-05-27 LAB — CBC WITH DIFFERENTIAL (CANCER CENTER ONLY)
Abs Immature Granulocytes: 0.04 10*3/uL (ref 0.00–0.07)
Basophils Absolute: 0 10*3/uL (ref 0.0–0.1)
Basophils Relative: 0 %
Eosinophils Absolute: 0 10*3/uL (ref 0.0–0.5)
Eosinophils Relative: 1 %
HCT: 32.7 % — ABNORMAL LOW (ref 36.0–46.0)
Hemoglobin: 10.6 g/dL — ABNORMAL LOW (ref 12.0–15.0)
Immature Granulocytes: 1 %
Lymphocytes Relative: 23 %
Lymphs Abs: 1.2 10*3/uL (ref 0.7–4.0)
MCH: 32.8 pg (ref 26.0–34.0)
MCHC: 32.4 g/dL (ref 30.0–36.0)
MCV: 101.2 fL — ABNORMAL HIGH (ref 80.0–100.0)
Monocytes Absolute: 0.7 10*3/uL (ref 0.1–1.0)
Monocytes Relative: 13 %
Neutro Abs: 3.1 10*3/uL (ref 1.7–7.7)
Neutrophils Relative %: 62 %
Platelet Count: 141 10*3/uL — ABNORMAL LOW (ref 150–400)
RBC: 3.23 MIL/uL — ABNORMAL LOW (ref 3.87–5.11)
RDW: 14.2 % (ref 11.5–15.5)
WBC Count: 5.1 10*3/uL (ref 4.0–10.5)
nRBC: 0 % (ref 0.0–0.2)

## 2022-05-27 LAB — IRON AND IRON BINDING CAPACITY (CC-WL,HP ONLY)
Iron: 77 ug/dL (ref 28–170)
Saturation Ratios: 24 % (ref 10.4–31.8)
TIBC: 315 ug/dL (ref 250–450)
UIBC: 238 ug/dL (ref 148–442)

## 2022-05-27 LAB — SAMPLE TO BLOOD BANK

## 2022-05-27 LAB — FERRITIN: Ferritin: 446 ng/mL — ABNORMAL HIGH (ref 11–307)

## 2022-05-27 LAB — LACTATE DEHYDROGENASE: LDH: 189 U/L (ref 98–192)

## 2022-05-27 LAB — TSH: TSH: 0.243 u[IU]/mL — ABNORMAL LOW (ref 0.350–4.500)

## 2022-05-27 MED ORDER — SODIUM CHLORIDE 0.9 % IV SOLN
10.0000 mg | Freq: Once | INTRAVENOUS | Status: AC
  Administered 2022-05-27: 10 mg via INTRAVENOUS
  Filled 2022-05-27: qty 10

## 2022-05-27 MED ORDER — PALONOSETRON HCL INJECTION 0.25 MG/5ML
0.2500 mg | Freq: Once | INTRAVENOUS | Status: AC
  Administered 2022-05-27: 0.25 mg via INTRAVENOUS
  Filled 2022-05-27: qty 5

## 2022-05-27 MED ORDER — HEPARIN SOD (PORK) LOCK FLUSH 100 UNIT/ML IV SOLN
500.0000 [IU] | Freq: Once | INTRAVENOUS | Status: AC | PRN
  Administered 2022-05-27: 500 [IU]

## 2022-05-27 MED ORDER — SODIUM CHLORIDE 0.9 % IV SOLN: Freq: Once | INTRAVENOUS | Status: AC

## 2022-05-27 MED ORDER — SODIUM CHLORIDE 0.9 % IV SOLN
3.2000 mg/m2 | Freq: Once | INTRAVENOUS | Status: AC
  Administered 2022-05-27: 6.45 mg via INTRAVENOUS
  Filled 2022-05-27: qty 12.9

## 2022-05-27 MED ORDER — SODIUM CHLORIDE 0.9% FLUSH
10.0000 mL | INTRAVENOUS | Status: DC | PRN
  Administered 2022-05-27: 10 mL

## 2022-05-27 NOTE — Progress Notes (Signed)
Patient here for cycle two of Zepzelca. She will not need scans until after her third cycle.   She just returned from a trip to the beach with her daughters family. She had an enjoyable time and only had one episode with pain which required intervention.   Oncology Nurse Navigator Documentation     05/27/2022    9:00 AM  Oncology Nurse Navigator Flowsheets  Navigator Follow Up Date: 06/17/2022  Navigator Follow Up Reason: Follow-up Appointment;Chemotherapy  Navigator Location [facility]  Navigator Encounter Type Treatment;Appt/Treatment Plan Review  Patient Visit Type MedOnc  Treatment Phase Active Tx  Barriers/Navigation Needs Coordination of Care;Education  Interventions Psycho-Social Support  Acuity Level 2-Minimal Needs (1-2 Barriers Identified)  Support Groups/Services Friends and Family  Time Spent with Patient 15

## 2022-05-27 NOTE — Progress Notes (Signed)
Hematology and Oncology Follow Up Visit  Pamela [name] [id number] June 30, 1961 61 y.o. 05/27/2022   Principle Diagnosis:  Extensive stage small cell lung cancer -- relapsed   Current Therapy:        Carboplatinum/etoposide/Tecentriq -1st cycle given in the hospital on 11/05/2021, s/p cycle 6 -- completed in 03/08/2022 Tecentriq - q 3week  -- maintenance - start on 04/10/2022 Lurbinectidin (Zepzelca) -- start cycle #1 on 05/06/2022    Interim History:  Pamela [name] is here today with her brother for follow-up and treatment. She had a wonderful time with her family at the beach and has a nice tan today! She states that she tolerated her first cycle of Lurbinectidine nicely. She had some mild nausea which she state resolved when she took her antiemetic.  She notes that her energy and SOB have improved as well. She hasn't needed her supplemental O2.  No fever, chills, n/v, cough, rash, dizziness, chest pain, palpitations, abdominal pain or changes in bowel or bladder habits at this time.  She has occasional tingling in her fingertips that comes and goes.  No swelling or tenderness in her extremities. No falls or syncope reported.  Her appetite and hydration have been good. Her weight is stable at 197 lbs.   ECOG Performance Status: 1 - Symptomatic but completely ambulatory  Medications:  Allergies as of 05/27/2022       Reactions   Rofecoxib Swelling        Medication List        Accurate as of May 27, 2022  9:08 AM. If you have any questions, ask your nurse or doctor.          albuterol 108 (90 Base) MCG/ACT inhaler Commonly known as: VENTOLIN HFA 2 puffs every 6 (six) hours as needed for wheezing or shortness of breath.   ciprofloxacin 500 MG tablet Commonly known as: Cipro Take 1 tablet (500 mg total) by mouth daily with breakfast.   dexamethasone 4 MG tablet Commonly known as: DECADRON Take 2 tablets (8 mg total) by mouth daily. Start the day after chemotherapy for  2 days. Take with food.   diclofenac Sodium 1 % Gel Commonly known as: VOLTAREN Apply topically 4 (four) times daily.   esomeprazole 20 MG capsule Commonly known as: NEXIUM Take 20 mg by mouth daily at 12 noon.   fentaNYL 12 MCG/HR Commonly known as: [place] 1 patch onto the skin every 3 (three) days.   furosemide 40 MG tablet Commonly known as: Lasix Take 1 tablet (40 mg total) by mouth daily.   levothyroxine 125 MCG tablet Commonly known as: SYNTHROID TAKE 1 TABLET(125 MCG) BY MOUTH DAILY BEFORE BREAKFAST   lidocaine-prilocaine cream Commonly known as: EMLA Apply to affected area once   multivitamin tablet Take 1 tablet by mouth daily.   naloxone 4 MG/0.1ML Liqd nasal spray kit Commonly known as: NARCAN 1 spray as needed (overdose).   ondansetron 8 MG tablet Commonly known as: Zofran Take 1 tablet (8 mg total) by mouth every 8 (eight) hours as needed for nausea or vomiting. Start on the third day after chemotherapy.   Oxycodone HCl 10 MG Tabs Take 1 tablet (10 mg total) by mouth every 3 (three) hours as needed (breakthrough pain).   potassium chloride SA 20 MEQ tablet Commonly known as: KLOR-CON M TAKE 2 TABLETS BY MOUTH TWICE DAILY   prochlorperazine 10 MG tablet Commonly known as: COMPAZINE Take 1 tablet (10 mg total) by mouth every 6 (six) hours as  needed for nausea or vomiting.   senna-docusate 8.6-50 MG tablet Commonly known as: Senokot-S Take 1 tablet by mouth 2 (two) times daily.   simvastatin 40 MG tablet Commonly known as: ZOCOR Take 40 mg by mouth every evening.   sitaGLIPtin-metformin 50-500 MG tablet Commonly known as: JANUMET Take 1 tablet by mouth 2 (two) times daily with a meal.        Allergies:  Allergies  Allergen Reactions   Rofecoxib Swelling    Past Medical History, Surgical history, Social history, and Family History were reviewed and updated.  Review of Systems: All other 10 point review of systems is negative.    Physical Exam:  height is _0  (1.676 m) and weight is 197 lb (89.4 kg). Her oral temperature is 97.7 F (36.5 C). Her blood pressure is 127/71 and her pulse is 78. Her respiration is 17 and oxygen saturation is 97%.   Wt Readings from Last 3 Encounters:  05/27/22 197 lb (89.4 kg)  05/06/22 191 lb 1.9 oz (86.7 kg)  05/01/22 192 lb 1.3 oz (87.1 kg)    Ocular: Sclerae unicteric, pupils equal, round and reactive to light Ear-nose-throat: Oropharynx clear, dentition fair Lymphatic: No cervical or supraclavicular adenopathy Lungs no rales or rhonchi, good excursion bilaterally Heart regular rate and rhythm, no murmur appreciated Abd soft, nontender, positive bowel sounds MSK no focal spinal tenderness, no joint edema Neuro: non-focal, well-oriented, appropriate affect Breasts: Deferred   Lab Results  Component Value Date   WBC 5.1 05/27/2022   HGB 10.6 (L) 05/27/2022   HCT 32.7 (L) 05/27/2022   MCV 101.2 (H) 05/27/2022   PLT 141 (L) 05/27/2022   Lab Results  Component Value Date   FERRITIN 735 (H) 05/01/2022   IRON 32 05/01/2022   TIBC 286 05/01/2022   UIBC 254 05/01/2022   IRONPCTSAT 11 05/01/2022   Lab Results  Component Value Date   RETICCTPCT 1.8 05/01/2022   RBC 3.23 (L) 05/27/2022   No results found for: "KPAFRELGTCHN", "LAMBDASER", "KAPLAMBRATIO" No results found for: "IGGSERUM", "IGA", "IGMSERUM" No results found for: "TOTALPROTELP", "ALBUMINELP", "A1GS", "A2GS", "BETS", "BETA2SER", "GAMS", "MSPIKE", "SPEI"   Chemistry      Component Value Date/Time   NA 135 05/01/2022 0837   K 3.5 05/01/2022 0837   CL 97 (L) 05/01/2022 0837   CO2 28 05/01/2022 0837   BUN 12 05/01/2022 0837   CREATININE 0.80 05/01/2022 0837      Component Value Date/Time   CALCIUM 9.6 05/01/2022 0837   ALKPHOS 158 (H) 05/01/2022 0837   AST 27 05/01/2022 0837   ALT 20 05/01/2022 0837   BILITOT 0.6 05/01/2022 0837       Impression and Plan: Pamela [name] is a very pleasant 61 yo  caucasian female with extensive stage small cell lung cancer and completed her initial chemotherapy and was on maintenance Tecentriq when she was found to have relapsed after developing worsening SOB.  She tolerated her first cycle of Lurbinectidin nicely.  CBC and CMP including elevated LFT's with Dr. Marin Olp. We will proceed with cycle 2 today as planned per MD.  We will plan to repeat scans after her 3rd cycle.  Follow-up in 3 weeks.   Lottie Dawson, NP 9/11/20239:08 AM

## 2022-05-27 NOTE — Patient Instructions (Signed)

## 2022-05-27 NOTE — Patient Instructions (Signed)
[facility]  Discharge Instructions: Thank you for choosing [facility] to provide your oncology and hematology care.   If you have a lab appointment with the [facility], please go directly to the [facility] and check in at the registration area.  Wear comfortable clothing and clothing appropriate for easy access to any Portacath or PICC line.   We strive to give you quality time with your provider. You may need to reschedule your appointment if you arrive late (15 or more minutes).  Arriving late affects you and other patients whose appointments are after yours.  Also, if you miss three or more appointments without notifying the office, you may be dismissed from the clinic at the provider's discretion.      For prescription refill requests, have your pharmacy contact our office and allow 72 hours for refills to be completed.    Today you received the following chemotherapy and/or immunotherapy agents zepzelca     To help prevent nausea and vomiting after your treatment, we encourage you to take your nausea medication as directed.  BELOW ARE SYMPTOMS THAT SHOULD BE REPORTED IMMEDIATELY: *FEVER GREATER THAN 100.4 F (38 C) OR HIGHER *CHILLS OR SWEATING *NAUSEA AND VOMITING THAT IS NOT CONTROLLED WITH YOUR NAUSEA MEDICATION *UNUSUAL SHORTNESS OF BREATH *UNUSUAL BRUISING OR BLEEDING *URINARY PROBLEMS (pain or burning when urinating, or frequent urination) *BOWEL PROBLEMS (unusual diarrhea, constipation, pain near the anus) TENDERNESS IN MOUTH AND THROAT WITH OR WITHOUT PRESENCE OF ULCERS (sore throat, sores in mouth, or a toothache) UNUSUAL RASH, SWELLING OR PAIN  UNUSUAL VAGINAL DISCHARGE OR ITCHING   Items with * indicate a potential emergency and should be followed up as soon as possible or go to the Emergency Department if any problems should occur.  Please show the CHEMOTHERAPY ALERT CARD or IMMUNOTHERAPY ALERT CARD at check-in to the  Emergency Department and triage nurse. Should you have questions after your visit or need to cancel or reschedule your appointment, please contact [facility]  [phone] and follow the prompts.  Office hours are 8:00 a.m. to 4:30 p.m. Monday - Friday. Please note that voicemails left after 4:00 p.m. may not be returned until the following business day.  We are closed weekends and major holidays. You have access to a nurse at all times for urgent questions. Please call the main number to the clinic [phone] and follow the prompts.  For any non-urgent questions, you may also contact your provider using MyChart. We now offer e-Visits for anyone 55 and older to request care online for non-urgent symptoms. For details visit [email or web address].   Also download the MyChart app! Go to the app store, search "MyChart", open the app, select [facility], and log in with your MyChart username and password.  Masks are optional in the cancer centers. If you would like for your care team to wear a mask while they are taking care of you, please let them know. You may have one support person who is at least 62 years old accompany you for your appointments.

## 2022-05-29 ENCOUNTER — Encounter: Payer: Self-pay | Admitting: Hematology & Oncology

## 2022-06-17 ENCOUNTER — Inpatient Hospital Stay: Payer: BC Managed Care – PPO

## 2022-06-17 ENCOUNTER — Inpatient Hospital Stay: Payer: BC Managed Care – PPO | Admitting: Hematology & Oncology

## 2022-06-25 ENCOUNTER — Inpatient Hospital Stay (HOSPITAL_BASED_OUTPATIENT_CLINIC_OR_DEPARTMENT_OTHER): Payer: BC Managed Care – PPO | Admitting: Medical Oncology

## 2022-06-25 ENCOUNTER — Inpatient Hospital Stay: Payer: BC Managed Care – PPO | Attending: Oncology

## 2022-06-25 ENCOUNTER — Inpatient Hospital Stay: Payer: BC Managed Care – PPO

## 2022-06-25 ENCOUNTER — Encounter: Payer: Self-pay | Admitting: Medical Oncology

## 2022-06-25 ENCOUNTER — Encounter: Payer: Self-pay | Admitting: *Deleted

## 2022-06-25 VITALS — BP 107/42 | HR 81 | Temp 98.1°F | Resp 17 | Ht 66.0 in | Wt 190.1 lb

## 2022-06-25 DIAGNOSIS — D509 Iron deficiency anemia, unspecified: Secondary | ICD-10-CM | POA: Insufficient documentation

## 2022-06-25 DIAGNOSIS — Z79899 Other long term (current) drug therapy: Secondary | ICD-10-CM | POA: Insufficient documentation

## 2022-06-25 DIAGNOSIS — D696 Thrombocytopenia, unspecified: Secondary | ICD-10-CM | POA: Diagnosis not present

## 2022-06-25 DIAGNOSIS — E871 Hypo-osmolality and hyponatremia: Secondary | ICD-10-CM | POA: Diagnosis not present

## 2022-06-25 DIAGNOSIS — R7989 Other specified abnormal findings of blood chemistry: Secondary | ICD-10-CM

## 2022-06-25 DIAGNOSIS — D649 Anemia, unspecified: Secondary | ICD-10-CM

## 2022-06-25 DIAGNOSIS — C3490 Malignant neoplasm of unspecified part of unspecified bronchus or lung: Secondary | ICD-10-CM

## 2022-06-25 DIAGNOSIS — Z5111 Encounter for antineoplastic chemotherapy: Secondary | ICD-10-CM | POA: Insufficient documentation

## 2022-06-25 DIAGNOSIS — E039 Hypothyroidism, unspecified: Secondary | ICD-10-CM

## 2022-06-25 LAB — IRON AND IRON BINDING CAPACITY (CC-WL,HP ONLY)
Iron: 16 ug/dL — ABNORMAL LOW (ref 28–170)
Saturation Ratios: 5 % — ABNORMAL LOW (ref 10.4–31.8)
TIBC: 298 ug/dL (ref 250–450)
UIBC: 282 ug/dL (ref 148–442)

## 2022-06-25 LAB — CMP (CANCER CENTER ONLY)
ALT: 32 U/L (ref 0–44)
AST: 44 U/L — ABNORMAL HIGH (ref 15–41)
Albumin: 3.9 g/dL (ref 3.5–5.0)
Alkaline Phosphatase: 159 U/L — ABNORMAL HIGH (ref 38–126)
Anion gap: 10 (ref 5–15)
BUN: 13 mg/dL (ref 8–23)
CO2: 27 mmol/L (ref 22–32)
Calcium: 9.2 mg/dL (ref 8.9–10.3)
Chloride: 95 mmol/L — ABNORMAL LOW (ref 98–111)
Creatinine: 0.9 mg/dL (ref 0.44–1.00)
GFR, Estimated: >60 mL/min (ref >60)
Glucose, Bld: 219 mg/dL — ABNORMAL HIGH (ref 70–99)
Potassium: 3.8 mmol/L (ref 3.5–5.1)
Sodium: 132 mmol/L — ABNORMAL LOW (ref 135–145)
Total Bilirubin: 0.7 mg/dL (ref 0.3–1.2)
Total Protein: 7.2 g/dL (ref 6.5–8.1)

## 2022-06-25 LAB — CBC WITH DIFFERENTIAL (CANCER CENTER ONLY)
Abs Immature Granulocytes: 0.05 10*3/uL (ref 0.00–0.07)
Basophils Absolute: 0 10*3/uL (ref 0.0–0.1)
Basophils Relative: 0 %
Eosinophils Absolute: 0 10*3/uL (ref 0.0–0.5)
Eosinophils Relative: 0 %
HCT: 31.1 % — ABNORMAL LOW (ref 36.0–46.0)
Hemoglobin: 10.3 g/dL — ABNORMAL LOW (ref 12.0–15.0)
Immature Granulocytes: 1 %
Lymphocytes Relative: 9 %
Lymphs Abs: 0.9 10*3/uL (ref 0.7–4.0)
MCH: 32.2 pg (ref 26.0–34.0)
MCHC: 33.1 g/dL (ref 30.0–36.0)
MCV: 97.2 fL (ref 80.0–100.0)
Monocytes Absolute: 1.1 10*3/uL — ABNORMAL HIGH (ref 0.1–1.0)
Monocytes Relative: 12 %
Neutro Abs: 7.4 10*3/uL (ref 1.7–7.7)
Neutrophils Relative %: 78 %
Platelet Count: 134 10*3/uL — ABNORMAL LOW (ref 150–400)
RBC: 3.2 MIL/uL — ABNORMAL LOW (ref 3.87–5.11)
RDW: 14.1 % (ref 11.5–15.5)
WBC Count: 9.4 10*3/uL (ref 4.0–10.5)
nRBC: 0 % (ref 0.0–0.2)

## 2022-06-25 LAB — LACTATE DEHYDROGENASE: LDH: 308 U/L — ABNORMAL HIGH (ref 98–192)

## 2022-06-25 LAB — RETICULOCYTES
Immature Retic Fract: 18.7 % — ABNORMAL HIGH (ref 2.3–15.9)
RBC.: 3.16 MIL/uL — ABNORMAL LOW (ref 3.87–5.11)
Retic Count, Absolute: 34.1 10*3/uL (ref 19.0–186.0)
Retic Ct Pct: 1.1 % (ref 0.4–3.1)

## 2022-06-25 LAB — SAMPLE TO BLOOD BANK

## 2022-06-25 LAB — FERRITIN: Ferritin: 992 ng/mL — ABNORMAL HIGH (ref 11–307)

## 2022-06-25 LAB — TSH: TSH: 1.288 u[IU]/mL (ref 0.350–4.500)

## 2022-06-25 MED ORDER — SODIUM CHLORIDE 0.9 % IV SOLN: Freq: Once | INTRAVENOUS | Status: AC

## 2022-06-25 MED ORDER — SODIUM CHLORIDE 0.9 % IV SOLN
10.0000 mg | Freq: Once | INTRAVENOUS | Status: AC
  Administered 2022-06-25: 10 mg via INTRAVENOUS
  Filled 2022-06-25: qty 10

## 2022-06-25 MED ORDER — SODIUM CHLORIDE 0.9 % IV SOLN
3.2000 mg/m2 | Freq: Once | INTRAVENOUS | Status: AC
  Administered 2022-06-25: 6.45 mg via INTRAVENOUS
  Filled 2022-06-25: qty 12.9

## 2022-06-25 MED ORDER — SODIUM CHLORIDE 0.9% FLUSH
10.0000 mL | INTRAVENOUS | Status: DC | PRN
  Administered 2022-06-25: 10 mL

## 2022-06-25 MED ORDER — HEPARIN SOD (PORK) LOCK FLUSH 100 UNIT/ML IV SOLN
500.0000 [IU] | Freq: Once | INTRAVENOUS | Status: AC | PRN
  Administered 2022-06-25: 500 [IU]

## 2022-06-25 MED ORDER — PALONOSETRON HCL INJECTION 0.25 MG/5ML
0.2500 mg | Freq: Once | INTRAVENOUS | Status: AC
  Administered 2022-06-25: 0.25 mg via INTRAVENOUS
  Filled 2022-06-25: qty 5

## 2022-06-25 NOTE — Progress Notes (Addendum)
Patient is doing well today. She had a cold last week and "felt miserable" but is much better now.   She will need a CT prior to her next cycle. Scheduled for 07/09/2022.  Patient is aware of CT appointment including time, date and location. She knows she needs to pick up contrast. Radiology information sheet also reviewed and given to patient with same information.   Oncology Nurse Navigator Documentation     06/25/2022   10:15 AM  Oncology Nurse Navigator Flowsheets  Navigator Follow Up Date: 07/09/2022  Navigator Follow Up Reason: Scan Review  Navigator Location [facility]  Navigator Encounter Type Treatment;Appt/Treatment Plan Review  Patient Visit Type MedOnc  Treatment Phase Active Tx  Barriers/Navigation Needs Coordination of Care;Education  Interventions Psycho-Social Support  Acuity Level 2-Minimal Needs (1-2 Barriers Identified)  Support Groups/Services Friends and Family  Time Spent with Patient 30

## 2022-06-25 NOTE — Patient Instructions (Signed)
Lurbinectedin Injection What is this medication? LURBINECTEDIN (LOOR bin EK te din) treats lung cancer. It works by slowing down the growth of cancer cells. This medicine may be used for other purposes; ask your health care provider or pharmacist if you have questions. COMMON BRAND NAME(S): ZEPZELCA What should I tell my care team before I take this medication? They need to know if you have any of these conditions: Liver disease Low blood cell levels, such as low white cells, platelets, red blood cells An unusual or allergic reaction to lurbinectedin, other medications, foods, dyes, or preservatives If you or your partner are pregnant or trying to get pregnant Breastfeeding How should I use this medication? This medication is injected into a vein. It is given by your care team in a hospital or clinic setting. Talk to your care team about the use of this medication in children. Special care may be needed. Overdosage: If you think you have taken too much of this medicine contact a poison control center or emergency room at once. NOTE: This medicine is only for you. Do not share this medicine with others. What if I miss a dose? Keep appointments for follow-up doses. It is important not to miss your dose. Call your care team if you are unable to keep an appointment. What may interact with this medication? Certain antibiotics, such as erythromycin, clarithromycin Certain antivirals for HIV or hepatitis Certain medications for fungal infections, such as ketoconazole, itraconazole, posaconazole Certain medications for seizures, such as carbamazepine, phenobarbital, phenytoin Grapefruit or grapefruit juice St. John's wort This list may not describe all possible interactions. Give your health care provider a list of all the medicines, herbs, non-prescription drugs, or dietary supplements you use. Also tell them if you smoke, drink alcohol, or use illegal drugs. Some items may interact with your  medicine. What should I watch for while using this medication? Your condition will be monitored carefully while you are receiving this medication. This medication may make you feel generally unwell. This is not uncommon as chemotherapy can affect healthy cells as well as cancer cells. Report any side effects. Continue your course of treatment even though you feel ill unless your care team tells you to stop. This medication may increase your risk of getting an infection. Call your care team for advice if you get a fever, chills, sore throat, or other symptoms of a cold or flu. Do not treat yourself. Try to avoid being around people who are sick. Avoid taking medications that contain aspirin, acetaminophen, ibuprofen, naproxen, or ketoprofen unless instructed by your care team. These medications may hide a fever. Be careful brushing or flossing your teeth or using a toothpick because you may get an infection or bleed more easily. If you have any dental work done, tell your dentist you are receiving this medication. Talk to your care team if you may be pregnant. Serious birth defects can occur if you take this medication during pregnancy and for 6 months after the last dose. You will need a negative pregnancy test before starting this medication. Contraception is recommended while taking this medication and for 6 months after the last dose. If your partner can get pregnant, use a condom during sex while taking this medication and for 4 months after the last dose. Do not breastfeed while taking this medication and for 2 weeks after the last dose. What side effects may I notice from receiving this medication? Side effects that you should report to your care team as soon  as possible: Allergic reactions--skin rash, itching, hives, swelling of the face, lips, tongue, or throat Infection--fever, chills, cough, sore throat, wounds that don't heal, pain or trouble when passing urine, general feeling of discomfort  or being unwell Liver injury--right upper belly pain, loss of appetite, nausea, light-colored stool, dark yellow or brown urine, yellowing skin or eyes, unusual weakness or fatigue Low red blood cell level--unusual weakness or fatigue, dizziness, headache, trouble breathing Muscle injury--unusual weakness or fatigue, muscle pain, dark yellow or brown urine, decrease in amount of urine Painful swelling, warmth, or redness of the skin, blisters or sores at the infusion site Unusual bruising or bleeding Side effects that usually do not require medical attention (report these to your care team if they continue or are bothersome): Constipation Cough Diarrhea Fatigue Loss of appetite Nausea This list may not describe all possible side effects. Call your doctor for medical advice about side effects. You may report side effects to FDA at 1-800-FDA-1088. Where should I keep my medication? This medication is given in a hospital or clinic. It will not be stored at home. NOTE: This sheet is a summary. It may not cover all possible information. If you have questions about this medicine, talk to your doctor, pharmacist, or health care provider.  2023 Elsevier/Gold Standard (2022-01-22 00:00:00)

## 2022-06-25 NOTE — Patient Instructions (Signed)

## 2022-06-25 NOTE — Progress Notes (Unsigned)
Hematology and Oncology Follow Up Visit  Pamela [name] [id number] 10-Dec-1960 61 y.o. 06/26/2022   Principle Diagnosis:  Extensive stage small cell lung cancer -- relapsed   Current Therapy:        Carboplatinum/etoposide/Tecentriq -1st cycle given in the hospital on 11/05/2021, s/p cycle 6 -- completed in 03/08/2022 Tecentriq - q 3week  -- maintenance - start on 04/10/2022 Lurbinectidin (Zepzelca) -- start cycle #1 on 05/06/2022    Interim History:  Pamela [name] is here today with her granddaughter.   She missed her last visit as she had a common cold.  She states that she is feeling back to her baseline and well today without any cold symptoms.  She denies any recent fever.  She is excited to continue on her cefazolin, which she is tolerating well.  She does have mild nausea following therapy but has plenty of home medications at help her with this side effect.  Other than this she denies any side effects.  She is eating and drinking well. No known weight loss per patient- below trends do show a 7 pound loss. No bleeding episodes.  Wt Readings from Last 3 Encounters:  06/25/22 190 lb 1.6 oz (86.2 kg)  05/27/22 197 lb (89.4 kg)  05/06/22 191 lb 1.9 oz (86.7 kg)     ECOG Performance Status: 1 - Symptomatic but completely ambulatory  Medications:  Allergies as of 06/25/2022       Reactions   Rofecoxib Swelling        Medication List        Accurate as of June 25, 2022 11:59 PM. If you have any questions, ask your nurse or doctor.          albuterol 108 (90 Base) MCG/ACT inhaler Commonly known as: VENTOLIN HFA 2 puffs every 6 (six) hours as needed for wheezing or shortness of breath.   ciprofloxacin 500 MG tablet Commonly known as: Cipro Take 1 tablet (500 mg total) by mouth daily with breakfast.   dexamethasone 4 MG tablet Commonly known as: DECADRON Take 2 tablets (8 mg total) by mouth daily. Start the day after chemotherapy for 2 days. Take with food.    diclofenac Sodium 1 % Gel Commonly known as: VOLTAREN Apply topically 4 (four) times daily.   esomeprazole 20 MG capsule Commonly known as: NEXIUM Take 20 mg by mouth daily at 12 noon.   fentaNYL 12 MCG/HR Commonly known as: [place] 1 patch onto the skin every 3 (three) days.   furosemide 40 MG tablet Commonly known as: Lasix Take 1 tablet (40 mg total) by mouth daily.   levothyroxine 125 MCG tablet Commonly known as: SYNTHROID TAKE 1 TABLET(125 MCG) BY MOUTH DAILY BEFORE BREAKFAST   lidocaine-prilocaine cream Commonly known as: EMLA Apply to affected area once   multivitamin tablet Take 1 tablet by mouth daily.   naloxone 4 MG/0.1ML Liqd nasal spray kit Commonly known as: NARCAN 1 spray as needed (overdose).   ondansetron 8 MG tablet Commonly known as: Zofran Take 1 tablet (8 mg total) by mouth every 8 (eight) hours as needed for nausea or vomiting. Start on the third day after chemotherapy.   Oxycodone HCl 10 MG Tabs Take 1 tablet (10 mg total) by mouth every 3 (three) hours as needed (breakthrough pain).   potassium chloride SA 20 MEQ tablet Commonly known as: KLOR-CON M TAKE 2 TABLETS BY MOUTH TWICE DAILY   prochlorperazine 10 MG tablet Commonly known as: COMPAZINE Take 1 tablet (10 mg  total) by mouth every 6 (six) hours as needed for nausea or vomiting.   senna-docusate 8.6-50 MG tablet Commonly known as: Senokot-S Take 1 tablet by mouth 2 (two) times daily.   simvastatin 40 MG tablet Commonly known as: ZOCOR Take 40 mg by mouth every evening.   sitaGLIPtin-metformin 50-500 MG tablet Commonly known as: JANUMET Take 1 tablet by mouth 2 (two) times daily with a meal.        Allergies:  Allergies  Allergen Reactions   Rofecoxib Swelling    Past Medical History, Surgical history, Social history, and Family History were reviewed and updated.  Review of Systems: All other 10 point review of systems is negative.   Physical Exam:   height is _0  (1.676 m) and weight is 190 lb 1.6 oz (86.2 kg). Her oral temperature is 98.1 F (36.7 C). Her blood pressure is 107/42 (abnormal) and her pulse is 81. Her respiration is 17 and oxygen saturation is 96%.   Wt Readings from Last 3 Encounters:  06/25/22 190 lb 1.6 oz (86.2 kg)  05/27/22 197 lb (89.4 kg)  05/06/22 191 lb 1.9 oz (86.7 kg)    Ocular: Sclerae unicteric, pupils equal, round and reactive to light Ear-nose-throat: Oropharynx clear, dentition fair Lymphatic: No cervical or supraclavicular adenopathy Lungs no rales or rhonchi, good excursion bilaterally Heart regular rate and rhythm, no murmur appreciated Abd soft, nontender, positive bowel sounds MSK no focal spinal tenderness, no joint edema Neuro: non-focal, well-oriented, appropriate affect Breasts: Deferred   Lab Results  Component Value Date   WBC 9.4 06/25/2022   HGB 10.3 (L) 06/25/2022   HCT 31.1 (L) 06/25/2022   MCV 97.2 06/25/2022   PLT 134 (L) 06/25/2022   Lab Results  Component Value Date   FERRITIN 992 (H) 06/25/2022   IRON 16 (L) 06/25/2022   TIBC 298 06/25/2022   UIBC 282 06/25/2022   IRONPCTSAT 5 (L) 06/25/2022   Lab Results  Component Value Date   RETICCTPCT 1.1 06/25/2022   RBC 3.20 (L) 06/25/2022   RBC 3.16 (L) 06/25/2022   No results found for: "KPAFRELGTCHN", "LAMBDASER", "KAPLAMBRATIO" No results found for: "IGGSERUM", "IGA", "IGMSERUM" No results found for: "TOTALPROTELP", "ALBUMINELP", "A1GS", "A2GS", "BETS", "BETA2SER", "GAMS", "MSPIKE", "SPEI"   Chemistry      Component Value Date/Time   NA 132 (L) 06/25/2022 0929   K 3.8 06/25/2022 0929   CL 95 (L) 06/25/2022 0929   CO2 27 06/25/2022 0929   BUN 13 06/25/2022 0929   CREATININE 0.90 06/25/2022 0929      Component Value Date/Time   CALCIUM 9.2 06/25/2022 0929   ALKPHOS 159 (H) 06/25/2022 0929   AST 44 (H) 06/25/2022 0929   ALT 32 06/25/2022 0929   BILITOT 0.7 06/25/2022 0929      Encounter Diagnoses  Name  Primary?   Small cell lung cancer (HCC) Yes   Iron deficiency anemia, unspecified iron deficiency anemia type    Symptomatic anemia    Thrombocytopenia (HCC)    Hyponatremia    LFT elevation     Impression and Plan: Pamela [name] is a very pleasant 61 yo caucasian female with extensive stage small cell lung cancer and completed her initial chemotherapy and was on maintenance Tecentriq when she was found to have relapsed after developing worsening SOB.  She tolerated her first cycle of Lurbinectidin nicely.  Her labs were reviewed today and acceptable for treatment - Cycle 3 Zepzelca  Hyponatremia is new- will monitor Thrombocytopenia is essentially stable. Will  continue to monitor  LFT elevation has improved- will continue to monitor IDA is stable She is due for repeat repeat imaging following cycle 3.   Disposition: Ok to proceed forward with Cycle 3 of Zepzelca today CT Chest/ABD/pelvis W/ ordered for 7-10 days from now RTC 1-3 days following scans to discuss results with Dr. Ronald Pippins, PA-C 10/11/202310:41 AM

## 2022-06-26 ENCOUNTER — Encounter: Payer: Self-pay | Admitting: Hematology & Oncology

## 2022-07-01 ENCOUNTER — Other Ambulatory Visit: Payer: Self-pay | Admitting: Family

## 2022-07-04 ENCOUNTER — Other Ambulatory Visit: Payer: Self-pay | Admitting: *Deleted

## 2022-07-04 ENCOUNTER — Inpatient Hospital Stay: Payer: BC Managed Care – PPO

## 2022-07-04 ENCOUNTER — Other Ambulatory Visit: Payer: Self-pay | Admitting: Hematology & Oncology

## 2022-07-04 VITALS — BP 136/60 | HR 70 | Temp 98.2°F | Resp 16

## 2022-07-04 DIAGNOSIS — E878 Other disorders of electrolyte and fluid balance, not elsewhere classified: Secondary | ICD-10-CM

## 2022-07-04 DIAGNOSIS — C3490 Malignant neoplasm of unspecified part of unspecified bronchus or lung: Secondary | ICD-10-CM

## 2022-07-04 DIAGNOSIS — Z5111 Encounter for antineoplastic chemotherapy: Secondary | ICD-10-CM | POA: Diagnosis not present

## 2022-07-04 MED ORDER — SODIUM CHLORIDE 0.9% FLUSH
10.0000 mL | Freq: Once | INTRAVENOUS | Status: AC | PRN
  Administered 2022-07-04: 10 mL

## 2022-07-04 MED ORDER — SODIUM CHLORIDE 0.9% FLUSH: 10.0000 mL | Freq: Once | INTRAVENOUS | Status: DC | PRN

## 2022-07-04 MED ORDER — HEPARIN SOD (PORK) LOCK FLUSH 100 UNIT/ML IV SOLN
500.0000 [IU] | Freq: Once | INTRAVENOUS | Status: AC | PRN
  Administered 2022-07-04: 500 [IU]

## 2022-07-04 MED ORDER — SODIUM CHLORIDE 0.9 % IV SOLN: Freq: Once | INTRAVENOUS | Status: AC

## 2022-07-04 MED ORDER — SODIUM CHLORIDE 0.9 % IV SOLN
300.0000 mg | Freq: Once | INTRAVENOUS | Status: AC
  Administered 2022-07-04: 300 mg via INTRAVENOUS
  Filled 2022-07-04: qty 300

## 2022-07-04 MED ORDER — SODIUM CHLORIDE 0.9% FLUSH: 3.0000 mL | Freq: Once | INTRAVENOUS | Status: DC | PRN

## 2022-07-04 NOTE — Patient Instructions (Signed)

## 2022-07-05 ENCOUNTER — Encounter: Payer: Self-pay | Admitting: *Deleted

## 2022-07-05 DIAGNOSIS — C3490 Malignant neoplasm of unspecified part of unspecified bronchus or lung: Secondary | ICD-10-CM

## 2022-07-09 ENCOUNTER — Encounter: Payer: Self-pay | Admitting: Hematology & Oncology

## 2022-07-09 ENCOUNTER — Ambulatory Visit
Admission: RE | Admit: 2022-07-09 | Discharge: 2022-07-09 | Disposition: A | Discharge status: Home / Self Care | Payer: BC Managed Care – PPO | Source: Ambulatory Visit | Attending: Hematology & Oncology | Admitting: Hematology & Oncology

## 2022-07-09 ENCOUNTER — Ambulatory Visit
Admission: RE | Admit: 2022-07-09 | Discharge: 2022-07-09 | Disposition: A | Discharge status: Home / Self Care | Payer: BC Managed Care – PPO | Source: Ambulatory Visit | Attending: Medical Oncology | Admitting: Medical Oncology

## 2022-07-09 DIAGNOSIS — C3490 Malignant neoplasm of unspecified part of unspecified bronchus or lung: Secondary | ICD-10-CM

## 2022-07-09 MED ORDER — IOHEXOL 300 MG/ML  SOLN
100.0000 mL | Freq: Once | INTRAMUSCULAR | Status: AC | PRN
  Administered 2022-07-09: 100 mL via INTRAVENOUS

## 2022-07-09 MED ORDER — GADOBUTROL 1 MMOL/ML IV SOLN
7.5000 mL | Freq: Once | INTRAVENOUS | Status: AC | PRN
  Administered 2022-07-09: 7.5 mL via INTRAVENOUS

## 2022-07-09 NOTE — Progress Notes (Signed)
Orders placed for MRI

## 2022-07-10 ENCOUNTER — Other Ambulatory Visit: Payer: Self-pay | Admitting: Family

## 2022-07-10 ENCOUNTER — Inpatient Hospital Stay: Payer: BC Managed Care – PPO | Admitting: Licensed Clinical Social Worker

## 2022-07-10 ENCOUNTER — Telehealth: Payer: Self-pay | Admitting: Family

## 2022-07-10 ENCOUNTER — Encounter: Payer: Self-pay | Admitting: *Deleted

## 2022-07-10 DIAGNOSIS — C3490 Malignant neoplasm of unspecified part of unspecified bronchus or lung: Secondary | ICD-10-CM

## 2022-07-10 DIAGNOSIS — C7931 Secondary malignant neoplasm of brain: Secondary | ICD-10-CM

## 2022-07-10 MED ORDER — FLUCONAZOLE 100 MG PO TABS: 100.0000 mg | ORAL_TABLET | Freq: Every day | ORAL | 1 refills | Status: DC

## 2022-07-10 MED ORDER — DEXAMETHASONE 4 MG PO TABS: 4.0000 mg | ORAL_TABLET | Freq: Three times a day (TID) | ORAL | 1 refills | Status: DC

## 2022-07-10 MED ORDER — FAMOTIDINE 40 MG PO TABS: 40.0000 mg | ORAL_TABLET | Freq: Two times a day (BID) | ORAL | 1 refills | Status: DC

## 2022-07-10 NOTE — Progress Notes (Signed)
[place] Work  Initial Assessment   Sharai D Huante is a 61 y.o. year old female contacted by phone. Clinical Social Work was referred by nurse navigator for assessment of psychosocial needs.   SDOH (Social Determinants of Health) assessments performed: Yes SDOH Interventions    Flowsheet Row Clinical Support from 07/10/2022 in [place] Interventions   Food Insecurity Interventions Intervention Not Indicated  Housing Interventions Intervention Not Indicated  Transportation Interventions Intervention Not Indicated  Utilities Interventions Intervention Not Indicated  Financial Strain Interventions Intervention Not Indicated       SDOH Screenings   Food Insecurity: No Food Insecurity (07/10/2022)  Housing: Low Risk  (07/10/2022)  Transportation Needs: No Transportation Needs (07/10/2022)  Utilities: Not At Risk (07/10/2022)  Financial Resource Strain: Low Risk  (07/10/2022)  Tobacco Use: Medium Risk (07/04/2022)     Distress Screen completed: No     No data to display            Family/Social Information:  Housing Arrangement: patient lives with her husband, [name]. Family members/support persons in your life? Family and Friends Transportation concerns: no. Patient's family provides transportation currently. Employment: Retired.  Abriella stated she took early retirement.  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 Food access concerns: no Religious or spiritual practice: Not known Services Currently in place:  [employer]  Coping/ Adjustment to diagnosis: Patient understands treatment plan and what happens next? Yes.  She is looking forward to meeting with Dr. Marin Olp next week. Concerns about diagnosis and/or treatment:  None specified during  conversation. 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    SUMMARY:  Clinical Social Work Clinical Goal(s):  Freight forwarder options for unmet needs related to:  Financial Strain   Interventions: Discussed common feeling and emotions when being diagnosed with cancer, and the importance of support during treatment Informed patient of the support team roles and support services at [facility] Provided [place] contact information and encouraged patient to call with any questions or concerns Provided education regarding disability for those with cancer.  CSW left a release of information form with Nurse Navigator for patient to sign next Tuesday while she is at Dr. Vernona Rieger office.   Follow Up Plan: CSW will follow-up with patient by phone  Patient verbalizes understanding of plan: Yes    Rodman Pickle Keelen Quevedo, LCSW

## 2022-07-10 NOTE — Telephone Encounter (Signed)
I was able to call Pamela [name] and go over her brain MRI results. 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 Per Dr. Marin Olp we will get her in STAT with XRT at [facility] and she will also start oral Decadron, Diflucan and pepcid. Patient pleasant and only complaint at this time is blurry vision. She verbalized understanding and agreement with the plan. No questions or concerns at this time. Patient appreciative of call.

## 2022-07-10 NOTE — Progress Notes (Signed)
Patient's MRI shows extensive brain metastasis. Per Dr Marin Olp patient needs Stat Referral to [facility]. Order placed and called and left message with that department.   Called and spoke to patient's daughter, Pamela [name]. Answered her questions to her satisfaction. She is aware of new medication regimen and stat referral to Radiation Oncology.   RXT consultation scheduled for 07/15/2022.  Oncology Nurse Navigator Documentation     07/10/2022    8:45 AM  Oncology Nurse Navigator Flowsheets  Navigator Follow Up Date: 07/16/2022  Navigator Follow Up Reason: Follow-up Appointment  Navigator Location [facility]  Navigator Encounter Type Scan Review;Telephone;Appt/Treatment Plan Review  Telephone Diagnostic Results;Outgoing Call  Patient Visit Type MedOnc  Treatment Phase Active Tx  Barriers/Navigation Needs Coordination of Care;Education  Education Other  Interventions Education;Referrals;Psycho-Social Support  Referrals Radiation Oncology  Education Method Verbal  Support Groups/Services Friends and Family  Time Spent with Patient 6

## 2022-07-11 ENCOUNTER — Encounter: Payer: Self-pay | Admitting: *Deleted

## 2022-07-11 ENCOUNTER — Other Ambulatory Visit: Payer: BC Managed Care – PPO

## 2022-07-11 ENCOUNTER — Inpatient Hospital Stay: Payer: BC Managed Care – PPO

## 2022-07-11 ENCOUNTER — Inpatient Hospital Stay: Payer: BC Managed Care – PPO | Admitting: Family

## 2022-07-11 NOTE — Progress Notes (Signed)
Pamela Napoleon, MD  Cordelia Poche, RN Brandalynn Ofallon:  can you call her about the CT scan.  The results are not good.  The chemo is doing nothing for [name]!!! We may need to refer to [facility] or [facility].   Called and spoke to the patient. She had already seen the results online, but did have some follow up questions about her scan. Answered them to her satisfaction.   She picked up her medications yesterday and started them last night. She reports that she feels pretty good right now without any significant symptoms. She confirms her appointment with RadOnc on Monday.   Spoke to her about a referral to [facility] or [facility] for a second opinion and to see if they had any further recommendations or clinical trials. She was not eager to pursue this option. She doesn't want to move care from this location. Explained that a referral there doesn't end her care here and that we may be able to treat her here depending on what their recommendations would be. She will talk to her family, but wants to wait until she talks to Dr Marin Olp on Tuesday before she makes a decision about a referral.   Oncology Nurse Navigator Documentation     07/11/2022    9:30 AM  Oncology Nurse Navigator Flowsheets  Navigator Follow Up Date: 07/16/2022  Navigator Follow Up Reason: Follow-up Appointment  Navigator Location [facility]  Navigator Encounter Type Telephone  Telephone Diagnostic Results;Education;Outgoing Call  Patient Visit Type MedOnc  Treatment Phase Active Tx  Barriers/Navigation Needs Coordination of Care;Education  Education Other  Interventions Education  Acuity Level 2-Minimal Needs (1-2 Barriers Identified)  Education Method Verbal  Support Groups/Services Friends and Family  Time Spent with Patient 30

## 2022-07-15 ENCOUNTER — Ambulatory Visit
Admission: RE | Admit: 2022-07-15 | Discharge: 2022-07-15 | Disposition: A | Discharge status: Home / Self Care | Payer: BC Managed Care – PPO | Source: Ambulatory Visit | Attending: Radiation Oncology | Admitting: Radiation Oncology

## 2022-07-15 ENCOUNTER — Encounter: Payer: Self-pay | Admitting: Radiation Oncology

## 2022-07-15 VITALS — BP 145/70 | HR 63 | Temp 98.0°F | Resp 12 | Ht 62.0 in | Wt 191.0 lb

## 2022-07-15 DIAGNOSIS — Z79899 Other long term (current) drug therapy: Secondary | ICD-10-CM | POA: Insufficient documentation

## 2022-07-15 DIAGNOSIS — R590 Localized enlarged lymph nodes: Secondary | ICD-10-CM | POA: Insufficient documentation

## 2022-07-15 DIAGNOSIS — C3482 Malignant neoplasm of overlapping sites of left bronchus and lung: Secondary | ICD-10-CM | POA: Insufficient documentation

## 2022-07-15 DIAGNOSIS — Z79890 Hormone replacement therapy (postmenopausal): Secondary | ICD-10-CM | POA: Insufficient documentation

## 2022-07-15 DIAGNOSIS — K769 Liver disease, unspecified: Secondary | ICD-10-CM | POA: Insufficient documentation

## 2022-07-15 DIAGNOSIS — Z803 Family history of malignant neoplasm of breast: Secondary | ICD-10-CM | POA: Insufficient documentation

## 2022-07-15 DIAGNOSIS — C7931 Secondary malignant neoplasm of brain: Secondary | ICD-10-CM | POA: Insufficient documentation

## 2022-07-15 DIAGNOSIS — Z8542 Personal history of malignant neoplasm of other parts of uterus: Secondary | ICD-10-CM | POA: Insufficient documentation

## 2022-07-15 DIAGNOSIS — Z9221 Personal history of antineoplastic chemotherapy: Secondary | ICD-10-CM | POA: Insufficient documentation

## 2022-07-15 DIAGNOSIS — E119 Type 2 diabetes mellitus without complications: Secondary | ICD-10-CM | POA: Insufficient documentation

## 2022-07-15 DIAGNOSIS — Z791 Long term (current) use of non-steroidal anti-inflammatories (NSAID): Secondary | ICD-10-CM | POA: Insufficient documentation

## 2022-07-15 DIAGNOSIS — Z7952 Long term (current) use of systemic steroids: Secondary | ICD-10-CM | POA: Insufficient documentation

## 2022-07-15 DIAGNOSIS — Z800 Family history of malignant neoplasm of digestive organs: Secondary | ICD-10-CM | POA: Insufficient documentation

## 2022-07-15 DIAGNOSIS — Z7984 Long term (current) use of oral hypoglycemic drugs: Secondary | ICD-10-CM | POA: Insufficient documentation

## 2022-07-15 DIAGNOSIS — Z87891 Personal history of nicotine dependence: Secondary | ICD-10-CM | POA: Insufficient documentation

## 2022-07-15 DIAGNOSIS — I10 Essential (primary) hypertension: Secondary | ICD-10-CM | POA: Insufficient documentation

## 2022-07-15 NOTE — Consult Note (Signed)
NEW PATIENT EVALUATION  Name: Pamela [name]  MRN: [id number]  Date:   07/15/2022     DOB: 08-Apr-1961   This 61 y.o. female patient presents to the clinic for initial evaluation of brain metastasis and patient with known extensive stage small cell lung cancer.  REFERRING PHYSICIAN: Marinda Elk, MD  CHIEF COMPLAINT:  Chief Complaint  Patient presents with   Brain Mets    DIAGNOSIS: The encounter diagnosis was Secondary malignant neoplasm of brain [facility]).   PREVIOUS INVESTIGATIONS:  MRI of brain CT scan of chest reviewed Clinical notes reviewed Pathology reports reviewed  HPI: Patient is a 61 year old female with known extensive stage small cell lung cancer.  She was first diagnosed in early 27 and has been treated with Tecentriq and more recently withLurbinectidin (Zepzelca.  Recent reevaluation with CT scan shows interval progression of multiple pulmonary nodules in the left lung consistent with disease progression.  She also had increased nodes in the paraesophageal region and retroperitoneal space.  Also had multiple new hepatic lesions.  She was having some aphasia and headaches and MRI scan of her brain showed numerous intracranial metastasis with associated edema.  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  She is seen today for consideration of palliative radiation therapy to her whole brain.  PLANNED TREATMENT REGIMEN: Whole brain radiation  PAST MEDICAL HISTORY:  has a past medical history of Colon cancer ([place]) (2003), Diabetes mellitus without complication ([place]), Hypertension, Personal history of chemotherapy (2003), and Uterine cancer ([place]) (2007).    PAST SURGICAL HISTORY:  Past Surgical History:  Procedure Laterality Date   ABDOMINAL HYSTERECTOMY     BREAST EXCISIONAL BIOPSY Right 11/26/2010   neg/- PROLIFERATIVE FIBROCYSTIC CHANGE WITH ASSOCIATED    CHOLECYSTECTOMY     COLON SURGERY     2003   COLONOSCOPY     COLONOSCOPY WITH PROPOFOL N/A 12/11/2018    Procedure: COLONOSCOPY WITH PROPOFOL;  Surgeon: Lollie Sails, MD;  Location: [facility] ENDOSCOPY;  Service: Endoscopy;  Laterality: N/A;   ESOPHAGOGASTRODUODENOSCOPY (EGD) WITH PROPOFOL N/A 01/15/2022   Procedure: ESOPHAGOGASTRODUODENOSCOPY (EGD) WITH PROPOFOL;  Surgeon: Lesly Rubenstein, MD;  Location: [facility] ENDOSCOPY;  Service: Endoscopy;  Laterality: N/A;  DM   IR IMAGING GUIDED PORT INSERTION  11/20/2021    FAMILY HISTORY: family history includes Breast cancer (age of onset: 64) in her cousin; Breast cancer (age of onset: 56) in her cousin; Breast cancer (age of onset: 88) in her paternal aunt; Breast cancer (age of onset: 26) in her maternal aunt.  SOCIAL HISTORY:  reports that she quit smoking about 8 months ago. Her smoking use included cigarettes. She has a 43.00 pack-year smoking history. She has never used smokeless tobacco. She reports that she does not currently use alcohol. She reports that she does not use drugs.  ALLERGIES: Rofecoxib  MEDICATIONS:  Current Outpatient Medications  Medication Sig Dispense Refill   albuterol (VENTOLIN HFA) 108 (90 Base) MCG/ACT inhaler 2 puffs every 6 (six) hours as needed for wheezing or shortness of breath.     dexamethasone (DECADRON) 4 MG tablet Take 2 tablets (8 mg total) by mouth daily. Start the day after chemotherapy for 2 days. Take with food. 30 tablet 1   dexamethasone (DECADRON) 4 MG tablet Take 1 tablet (4 mg total) by mouth 3 (three) times daily. 90 tablet 1   diclofenac Sodium (VOLTAREN) 1 % GEL Apply topically 4 (four) times daily.     esomeprazole (NEXIUM) 20 MG capsule Take 20 mg by mouth  daily at 12 noon.     famotidine (PEPCID) 40 MG tablet Take 1 tablet (40 mg total) by mouth 2 (two) times daily. 60 tablet 1   fentaNYL (DURAGESIC) 12 MCG/HR Place 1 patch onto the skin every 3 (three) days. 5 patch 0   fluconazole (DIFLUCAN) 100 MG tablet Take 1 tablet (100 mg total) by mouth daily. 30 tablet 1   furosemide (LASIX) 40 MG tablet  Take 1 tablet (40 mg total) by mouth daily. 30 tablet 11   levothyroxine (SYNTHROID) 125 MCG tablet TAKE 1 TABLET(125 MCG) BY MOUTH DAILY BEFORE BREAKFAST 90 tablet 3   lidocaine-prilocaine (EMLA) cream Apply to affected area once 30 g 3   Multiple Vitamin (MULTIVITAMIN) tablet Take 1 tablet by mouth daily.     naloxone (NARCAN) nasal spray 4 mg/0.1 mL 1 spray as needed (overdose).     ondansetron (ZOFRAN) 8 MG tablet Take 1 tablet (8 mg total) by mouth every 8 (eight) hours as needed for nausea or vomiting. Start on the third day after chemotherapy. 30 tablet 1   Oxycodone HCl 10 MG TABS Take 1 tablet (10 mg total) by mouth every 3 (three) hours as needed (breakthrough pain). 90 tablet 0   potassium chloride SA (KLOR-CON M) 20 MEQ tablet TAKE 2 TABLETS BY MOUTH TWICE DAILY 120 tablet 2   prochlorperazine (COMPAZINE) 10 MG tablet Take 1 tablet (10 mg total) by mouth every 6 (six) hours as needed for nausea or vomiting. 30 tablet 1   senna-docusate (SENOKOT-S) 8.6-50 MG tablet Take 1 tablet by mouth 2 (two) times daily.     simvastatin (ZOCOR) 40 MG tablet Take 40 mg by mouth every evening.     sitaGLIPtin-metformin (JANUMET) 50-500 MG tablet Take 1 tablet by mouth 2 (two) times daily with a meal.     ciprofloxacin (CIPRO) 500 MG tablet Take 1 tablet (500 mg total) by mouth daily with breakfast. (Patient not taking: Reported on 05/27/2022) 15 tablet 4   No current facility-administered medications for this encounter.    ECOG PERFORMANCE STATUS:  1 - Symptomatic but completely ambulatory  REVIEW OF SYSTEMS: Patient denies any weight loss, fatigue, weakness, fever, chills or night sweats. Patient denies any loss of vision, blurred vision. Patient denies any ringing  of the ears or hearing loss. No irregular heartbeat. Patient denies heart murmur or history of fainting. Patient denies any chest pain or pain radiating to her upper extremities. Patient denies any shortness of breath, difficulty  breathing at night, cough or hemoptysis. Patient denies any swelling in the lower legs. Patient denies any nausea vomiting, vomiting of blood, or coffee ground material in the vomitus. Patient denies any stomach pain. Patient states has had normal bowel movements no significant constipation or diarrhea. Patient denies any dysuria, hematuria or significant nocturia. Patient denies any problems walking, swelling in the joints or loss of balance. Patient denies any skin changes, loss of hair or loss of weight. Patient denies any excessive worrying or anxiety or significant depression. Patient denies any problems with insomnia. Patient denies excessive thirst, polyuria, polydipsia. Patient denies any swollen glands, patient denies easy bruising or easy bleeding. Patient denies any recent infections, allergies or URI. Patient "s visual fields have not changed significantly in recent time.   PHYSICAL EXAM: BP (!) 145/70 (BP Location: Left Arm, Patient Position: Sitting, Cuff Size: Normal)   Pulse 63   Temp 98 F (36.7 C) (Tympanic)   Resp 12   Ht 5\' 2"  (1.575 m)  Wt 191 lb (86.6 kg)   BMI 34.93 kg/m  Well-developed well-nourished patient in NAD. HEENT reveals PERLA, EOMI, discs not visualized.  Oral cavity is clear. No oral mucosal lesions are identified. Neck is clear without evidence of cervical or supraclavicular adenopathy. Lungs are clear to A&P. Cardiac examination is essentially unremarkable with regular rate and rhythm without murmur rub or thrill. Abdomen is benign with no organomegaly or masses noted. Motor sensory and DTR levels are equal and symmetric in the upper and lower extremities. Cranial nerves II through XII are grossly intact. Proprioception is intact. No peripheral adenopathy or edema is identified. No motor or sensory levels are noted. Crude visual fields are within normal range.  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  We will plan on delivering 30 Gray in 12 fractions at 250.  Fraction based on the small cell nature of her disease.  Risks and benefits of treatment eluding hair loss fatigue alteration of blood counts possible skin reaction all were reviewed in detail with the patient.  She seems to comprehend my treatment plan well.  I personally set up and ordered CT simulation for this week and will have her treatments underway by early next week.  I would like to take this opportunity to thank you for allowing me to participate in the care of your patient.Noreene Filbert, MD

## 2022-07-16 ENCOUNTER — Inpatient Hospital Stay: Payer: BC Managed Care – PPO

## 2022-07-16 ENCOUNTER — Encounter (HOSPITAL_COMMUNITY): Payer: Self-pay | Admitting: Critical Care Medicine

## 2022-07-16 ENCOUNTER — Emergency Department: Payer: BC Managed Care – PPO

## 2022-07-16 ENCOUNTER — Inpatient Hospital Stay (HOSPITAL_COMMUNITY): Payer: BC Managed Care – PPO

## 2022-07-16 ENCOUNTER — Other Ambulatory Visit: Payer: Self-pay

## 2022-07-16 ENCOUNTER — Encounter: Payer: Self-pay | Admitting: *Deleted

## 2022-07-16 ENCOUNTER — Encounter (HOSPITAL_COMMUNITY): Payer: Self-pay

## 2022-07-16 ENCOUNTER — Inpatient Hospital Stay: Payer: BC Managed Care – PPO | Admitting: Hematology & Oncology

## 2022-07-16 ENCOUNTER — Emergency Department
Admission: EM | Admit: 2022-07-16 | Discharge: 2022-07-16 | Disposition: A | Discharge status: Other Acute Inpatient Hospital | Payer: BC Managed Care – PPO | Attending: Emergency Medicine | Admitting: Emergency Medicine

## 2022-07-16 ENCOUNTER — Inpatient Hospital Stay (HOSPITAL_COMMUNITY)
Admission: EM | Admit: 2022-07-16 | Discharge: 2022-08-02 | DRG: 054 | Disposition: A | Discharge status: Home Health | Payer: BC Managed Care – PPO | Source: Other Acute Inpatient Hospital | Attending: Internal Medicine | Admitting: Internal Medicine

## 2022-07-16 DIAGNOSIS — R0602 Shortness of breath: Secondary | ICD-10-CM | POA: Insufficient documentation

## 2022-07-16 DIAGNOSIS — D696 Thrombocytopenia, unspecified: Secondary | ICD-10-CM | POA: Diagnosis present

## 2022-07-16 DIAGNOSIS — Z6834 Body mass index (BMI) 34.0-34.9, adult: Secondary | ICD-10-CM | POA: Diagnosis not present

## 2022-07-16 DIAGNOSIS — Z515 Encounter for palliative care: Secondary | ICD-10-CM | POA: Diagnosis not present

## 2022-07-16 DIAGNOSIS — Z9911 Dependence on respirator [ventilator] status: Secondary | ICD-10-CM | POA: Diagnosis not present

## 2022-07-16 DIAGNOSIS — M545 Low back pain, unspecified: Secondary | ICD-10-CM | POA: Diagnosis not present

## 2022-07-16 DIAGNOSIS — Z8542 Personal history of malignant neoplasm of other parts of uterus: Secondary | ICD-10-CM

## 2022-07-16 DIAGNOSIS — E875 Hyperkalemia: Secondary | ICD-10-CM | POA: Diagnosis not present

## 2022-07-16 DIAGNOSIS — E1165 Type 2 diabetes mellitus with hyperglycemia: Secondary | ICD-10-CM | POA: Diagnosis not present

## 2022-07-16 DIAGNOSIS — R739 Hyperglycemia, unspecified: Secondary | ICD-10-CM

## 2022-07-16 DIAGNOSIS — Z888 Allergy status to other drugs, medicaments and biological substances status: Secondary | ICD-10-CM

## 2022-07-16 DIAGNOSIS — E871 Hypo-osmolality and hyponatremia: Secondary | ICD-10-CM | POA: Diagnosis present

## 2022-07-16 DIAGNOSIS — Z90710 Acquired absence of both cervix and uterus: Secondary | ICD-10-CM

## 2022-07-16 DIAGNOSIS — I10 Essential (primary) hypertension: Secondary | ICD-10-CM | POA: Diagnosis present

## 2022-07-16 DIAGNOSIS — R569 Unspecified convulsions: Secondary | ICD-10-CM | POA: Diagnosis present

## 2022-07-16 DIAGNOSIS — C3482 Malignant neoplasm of overlapping sites of left bronchus and lung: Secondary | ICD-10-CM | POA: Diagnosis present

## 2022-07-16 DIAGNOSIS — J9601 Acute respiratory failure with hypoxia: Secondary | ICD-10-CM | POA: Diagnosis present

## 2022-07-16 DIAGNOSIS — Z7984 Long term (current) use of oral hypoglycemic drugs: Secondary | ICD-10-CM | POA: Diagnosis not present

## 2022-07-16 DIAGNOSIS — Z85038 Personal history of other malignant neoplasm of large intestine: Secondary | ICD-10-CM | POA: Diagnosis not present

## 2022-07-16 DIAGNOSIS — Z79899 Other long term (current) drug therapy: Secondary | ICD-10-CM | POA: Diagnosis not present

## 2022-07-16 DIAGNOSIS — R4701 Aphasia: Secondary | ICD-10-CM | POA: Diagnosis present

## 2022-07-16 DIAGNOSIS — G40901 Epilepsy, unspecified, not intractable, with status epilepticus: Secondary | ICD-10-CM | POA: Insufficient documentation

## 2022-07-16 DIAGNOSIS — Z85118 Personal history of other malignant neoplasm of bronchus and lung: Secondary | ICD-10-CM | POA: Insufficient documentation

## 2022-07-16 DIAGNOSIS — Z803 Family history of malignant neoplasm of breast: Secondary | ICD-10-CM | POA: Diagnosis not present

## 2022-07-16 DIAGNOSIS — C3490 Malignant neoplasm of unspecified part of unspecified bronchus or lung: Secondary | ICD-10-CM

## 2022-07-16 DIAGNOSIS — Z79890 Hormone replacement therapy (postmenopausal): Secondary | ICD-10-CM

## 2022-07-16 DIAGNOSIS — Z20822 Contact with and (suspected) exposure to covid-19: Secondary | ICD-10-CM | POA: Diagnosis not present

## 2022-07-16 DIAGNOSIS — Z9221 Personal history of antineoplastic chemotherapy: Secondary | ICD-10-CM | POA: Diagnosis not present

## 2022-07-16 DIAGNOSIS — Z8589 Personal history of malignant neoplasm of other organs and systems: Secondary | ICD-10-CM | POA: Diagnosis not present

## 2022-07-16 DIAGNOSIS — Z9049 Acquired absence of other specified parts of digestive tract: Secondary | ICD-10-CM

## 2022-07-16 DIAGNOSIS — R1310 Dysphagia, unspecified: Secondary | ICD-10-CM | POA: Diagnosis present

## 2022-07-16 DIAGNOSIS — G928 Other toxic encephalopathy: Secondary | ICD-10-CM | POA: Diagnosis present

## 2022-07-16 DIAGNOSIS — E039 Hypothyroidism, unspecified: Secondary | ICD-10-CM | POA: Diagnosis present

## 2022-07-16 DIAGNOSIS — C7931 Secondary malignant neoplasm of brain: Secondary | ICD-10-CM | POA: Diagnosis present

## 2022-07-16 DIAGNOSIS — C787 Secondary malignant neoplasm of liver and intrahepatic bile duct: Secondary | ICD-10-CM | POA: Diagnosis present

## 2022-07-16 DIAGNOSIS — T380X5A Adverse effect of glucocorticoids and synthetic analogues, initial encounter: Secondary | ICD-10-CM | POA: Diagnosis present

## 2022-07-16 DIAGNOSIS — R5381 Other malaise: Secondary | ICD-10-CM

## 2022-07-16 DIAGNOSIS — R64 Cachexia: Secondary | ICD-10-CM | POA: Diagnosis present

## 2022-07-16 DIAGNOSIS — Z87891 Personal history of nicotine dependence: Secondary | ICD-10-CM

## 2022-07-16 DIAGNOSIS — G936 Cerebral edema: Secondary | ICD-10-CM | POA: Diagnosis present

## 2022-07-16 DIAGNOSIS — R4781 Slurred speech: Secondary | ICD-10-CM | POA: Diagnosis present

## 2022-07-16 DIAGNOSIS — N179 Acute kidney failure, unspecified: Secondary | ICD-10-CM | POA: Diagnosis present

## 2022-07-16 DIAGNOSIS — Z7189 Other specified counseling: Secondary | ICD-10-CM | POA: Diagnosis not present

## 2022-07-16 DIAGNOSIS — R7989 Other specified abnormal findings of blood chemistry: Secondary | ICD-10-CM | POA: Diagnosis not present

## 2022-07-16 HISTORY — DX: Personal history of malignant neoplasm of other organs and systems: Z85.89

## 2022-07-16 HISTORY — DX: Malignant neoplasm of unspecified part of unspecified bronchus or lung: C34.90

## 2022-07-16 LAB — URINE DRUG SCREEN, QUALITATIVE (ARMC ONLY)
Amphetamines, Ur Screen: NOT DETECTED
Barbiturates, Ur Screen: NOT DETECTED
Benzodiazepine, Ur Scrn: POSITIVE — AB
Cannabinoid 50 Ng, Ur ~~LOC~~: NOT DETECTED
Cocaine Metabolite,Ur ~~LOC~~: NOT DETECTED
MDMA (Ecstasy)Ur Screen: NOT DETECTED
Methadone Scn, Ur: NOT DETECTED
Opiate, Ur Screen: NOT DETECTED
Phencyclidine (PCP) Ur S: NOT DETECTED
Tricyclic, Ur Screen: NOT DETECTED

## 2022-07-16 LAB — COMPREHENSIVE METABOLIC PANEL
ALT: 110 U/L — ABNORMAL HIGH (ref 0–44)
ALT: 115 U/L — ABNORMAL HIGH (ref 0–44)
AST: 76 U/L — ABNORMAL HIGH (ref 15–41)
AST: 74 U/L — ABNORMAL HIGH (ref 15–41)
Albumin: 3.6 g/dL (ref 3.5–5.0)
Albumin: 3.5 g/dL (ref 3.5–5.0)
Alkaline Phosphatase: 226 U/L — ABNORMAL HIGH (ref 38–126)
Alkaline Phosphatase: 179 U/L — ABNORMAL HIGH (ref 38–126)
Anion gap: 13 (ref 5–15)
Anion gap: 20 — ABNORMAL HIGH (ref 5–15)
BUN: 49 mg/dL — ABNORMAL HIGH (ref 8–23)
BUN: 37 mg/dL — ABNORMAL HIGH (ref 8–23)
CO2: 24 mmol/L (ref 22–32)
CO2: 24 mmol/L (ref 22–32)
Calcium: 9.8 mg/dL (ref 8.9–10.3)
Calcium: 9.7 mg/dL (ref 8.9–10.3)
Chloride: 89 mmol/L — ABNORMAL LOW (ref 98–111)
Chloride: 95 mmol/L — ABNORMAL LOW (ref 98–111)
Creatinine, Ser: 1.49 mg/dL — ABNORMAL HIGH (ref 0.44–1.00)
Creatinine, Ser: 1.33 mg/dL — ABNORMAL HIGH (ref 0.44–1.00)
GFR, Estimated: 39 mL/min — ABNORMAL LOW (ref >60)
GFR, Estimated: 45 mL/min — ABNORMAL LOW (ref >60)
Glucose, Bld: 836 mg/dL (ref 70–99)
Glucose, Bld: 295 mg/dL — ABNORMAL HIGH (ref 70–99)
Potassium: 6.2 mmol/L — ABNORMAL HIGH (ref 3.5–5.1)
Potassium: 4.1 mmol/L (ref 3.5–5.1)
Sodium: 126 mmol/L — ABNORMAL LOW (ref 135–145)
Sodium: 139 mmol/L (ref 135–145)
Total Bilirubin: 0.7 mg/dL (ref 0.3–1.2)
Total Bilirubin: 0.7 mg/dL (ref 0.3–1.2)
Total Protein: 7.4 g/dL (ref 6.5–8.1)
Total Protein: 7 g/dL (ref 6.5–8.1)

## 2022-07-16 LAB — CBC WITH DIFFERENTIAL/PLATELET
Abs Immature Granulocytes: 0.56 10*3/uL — ABNORMAL HIGH (ref 0.00–0.07)
Basophils Absolute: 0.1 10*3/uL (ref 0.0–0.1)
Basophils Relative: 1 %
Eosinophils Absolute: 0 10*3/uL (ref 0.0–0.5)
Eosinophils Relative: 0 %
HCT: 33.4 % — ABNORMAL LOW (ref 36.0–46.0)
Hemoglobin: 10.9 g/dL — ABNORMAL LOW (ref 12.0–15.0)
Immature Granulocytes: 5 %
Lymphocytes Relative: 3 %
Lymphs Abs: 0.3 10*3/uL — ABNORMAL LOW (ref 0.7–4.0)
MCH: 32.2 pg (ref 26.0–34.0)
MCHC: 32.6 g/dL (ref 30.0–36.0)
MCV: 98.8 fL (ref 80.0–100.0)
Monocytes Absolute: 0.8 10*3/uL (ref 0.1–1.0)
Monocytes Relative: 7 %
Neutro Abs: 9.9 10*3/uL — ABNORMAL HIGH (ref 1.7–7.7)
Neutrophils Relative %: 84 %
Platelets: 153 10*3/uL (ref 150–400)
RBC: 3.38 MIL/uL — ABNORMAL LOW (ref 3.87–5.11)
RDW: 15.8 % — ABNORMAL HIGH (ref 11.5–15.5)
WBC: 11.6 10*3/uL — ABNORMAL HIGH (ref 4.0–10.5)
nRBC: 0.2 % (ref 0.0–0.2)

## 2022-07-16 LAB — URINALYSIS, ROUTINE W REFLEX MICROSCOPIC
Bacteria, UA: NONE SEEN
Bilirubin Urine: NEGATIVE
Glucose, UA: >=500 mg/dL — AB
Hgb urine dipstick: NEGATIVE
Ketones, ur: NEGATIVE mg/dL
Leukocytes,Ua: NEGATIVE
Nitrite: NEGATIVE
Protein, ur: NEGATIVE mg/dL
Specific Gravity, Urine: 1.024 (ref 1.005–1.030)
Squamous Epithelial / HPF: NONE SEEN (ref 0–5)
pH: 5 (ref 5.0–8.0)

## 2022-07-16 LAB — CBG MONITORING, ED
Glucose-Capillary: >600 mg/dL (ref 70–99)
Glucose-Capillary: >600 mg/dL (ref 70–99)
Glucose-Capillary: 499 mg/dL — ABNORMAL HIGH (ref 70–99)

## 2022-07-16 LAB — PHOSPHORUS: Phosphorus: 4.5 mg/dL (ref 2.5–4.6)

## 2022-07-16 LAB — GLUCOSE, CAPILLARY
Glucose-Capillary: 151 mg/dL — ABNORMAL HIGH (ref 70–99)
Glucose-Capillary: 168 mg/dL — ABNORMAL HIGH (ref 70–99)
Glucose-Capillary: 169 mg/dL — ABNORMAL HIGH (ref 70–99)
Glucose-Capillary: 173 mg/dL — ABNORMAL HIGH (ref 70–99)
Glucose-Capillary: 176 mg/dL — ABNORMAL HIGH (ref 70–99)
Glucose-Capillary: 182 mg/dL — ABNORMAL HIGH (ref 70–99)
Glucose-Capillary: 186 mg/dL — ABNORMAL HIGH (ref 70–99)
Glucose-Capillary: 186 mg/dL — ABNORMAL HIGH (ref 70–99)
Glucose-Capillary: 200 mg/dL — ABNORMAL HIGH (ref 70–99)
Glucose-Capillary: 202 mg/dL — ABNORMAL HIGH (ref 70–99)
Glucose-Capillary: 204 mg/dL — ABNORMAL HIGH (ref 70–99)
Glucose-Capillary: 205 mg/dL — ABNORMAL HIGH (ref 70–99)
Glucose-Capillary: 209 mg/dL — ABNORMAL HIGH (ref 70–99)
Glucose-Capillary: 216 mg/dL — ABNORMAL HIGH (ref 70–99)
Glucose-Capillary: 243 mg/dL — ABNORMAL HIGH (ref 70–99)
Glucose-Capillary: 251 mg/dL — ABNORMAL HIGH (ref 70–99)
Glucose-Capillary: 304 mg/dL — ABNORMAL HIGH (ref 70–99)
Glucose-Capillary: 354 mg/dL — ABNORMAL HIGH (ref 70–99)

## 2022-07-16 LAB — BLOOD GAS, ARTERIAL
Acid-Base Excess: 0 mmol/L (ref 0.0–2.0)
Bicarbonate: 24.8 mmol/L (ref 20.0–28.0)
FIO2: 35 %
MECHVT: 450 mL
Mechanical Rate: 20
O2 Saturation: 99.3 %
PEEP: 5 cmH2O
Patient temperature: 37
pCO2 arterial: 40 mmHg (ref 32–48)
pH, Arterial: 7.4 (ref 7.35–7.45)
pO2, Arterial: 150 mmHg — ABNORMAL HIGH (ref 83–108)

## 2022-07-16 LAB — CBC
HCT: 32.9 % — ABNORMAL LOW (ref 36.0–46.0)
Hemoglobin: 11.1 g/dL — ABNORMAL LOW (ref 12.0–15.0)
MCH: 32.8 pg (ref 26.0–34.0)
MCHC: 33.7 g/dL (ref 30.0–36.0)
MCV: 97.3 fL (ref 80.0–100.0)
Platelets: 182 10*3/uL (ref 150–400)
RBC: 3.38 MIL/uL — ABNORMAL LOW (ref 3.87–5.11)
RDW: 15.8 % — ABNORMAL HIGH (ref 11.5–15.5)
WBC: 22.5 10*3/uL — ABNORMAL HIGH (ref 4.0–10.5)
nRBC: 0.1 % (ref 0.0–0.2)

## 2022-07-16 LAB — SURGICAL PCR SCREEN
MRSA, PCR: NEGATIVE
Staphylococcus aureus: NEGATIVE

## 2022-07-16 LAB — SARS CORONAVIRUS 2 BY RT PCR: SARS Coronavirus 2 by RT PCR: NEGATIVE

## 2022-07-16 LAB — ETHANOL: Alcohol, Ethyl (B): <10 mg/dL (ref <10)

## 2022-07-16 LAB — HEMOGLOBIN A1C
Hgb A1c MFr Bld: 8.9 % — ABNORMAL HIGH (ref 4.8–5.6)
Mean Plasma Glucose: 208.73 mg/dL

## 2022-07-16 LAB — APTT
aPTT: 20 seconds — ABNORMAL LOW (ref 24–36)
aPTT: 20 seconds — ABNORMAL LOW (ref 24–36)

## 2022-07-16 LAB — PROTIME-INR
INR: 1.2 (ref 0.8–1.2)
Prothrombin Time: 14.9 seconds (ref 11.4–15.2)

## 2022-07-16 LAB — MAGNESIUM
Magnesium: 1.9 mg/dL (ref 1.7–2.4)
Magnesium: 1.6 mg/dL — ABNORMAL LOW (ref 1.7–2.4)

## 2022-07-16 MED ORDER — LACTATED RINGERS IV SOLN: INTRAVENOUS | Status: DC

## 2022-07-16 MED ORDER — SODIUM CHLORIDE 0.9 % IV SOLN
1500.0000 mg | Freq: Once | INTRAVENOUS | Status: AC
  Administered 2022-07-16: 1500 mg via INTRAVENOUS
  Filled 2022-07-16: qty 30

## 2022-07-16 MED ORDER — INSULIN ASPART 100 UNIT/ML IJ SOLN
0.0000 [IU] | INTRAMUSCULAR | Status: DC
  Administered 2022-07-16: 9 [IU] via SUBCUTANEOUS

## 2022-07-16 MED ORDER — LEVETIRACETAM IN NACL 1000 MG/100ML IV SOLN
INTRAVENOUS | Status: AC
  Administered 2022-07-16: 1000 mg via INTRAVENOUS
  Filled 2022-07-16: qty 100

## 2022-07-16 MED ORDER — MIDAZOLAM HCL 2 MG/2ML IJ SOLN
2.0000 mg | INTRAMUSCULAR | Status: DC | PRN
  Filled 2022-07-16: qty 2

## 2022-07-16 MED ORDER — INSULIN REGULAR(HUMAN) IN NACL 100-0.9 UT/100ML-% IV SOLN
INTRAVENOUS | Status: DC
  Administered 2022-07-16: 11.5 [IU]/h via INTRAVENOUS
  Administered 2022-07-16: 5.5 [IU]/h via INTRAVENOUS
  Filled 2022-07-16: qty 100

## 2022-07-16 MED ORDER — SODIUM CHLORIDE 0.9 % IV SOLN
200.0000 mg | Freq: Two times a day (BID) | INTRAVENOUS | Status: DC
  Administered 2022-07-16: 200 mg via INTRAVENOUS
  Filled 2022-07-16: qty 20

## 2022-07-16 MED ORDER — HEPARIN SODIUM (PORCINE) 5000 UNIT/ML IJ SOLN
5000.0000 [IU] | Freq: Three times a day (TID) | INTRAMUSCULAR | Status: DC
  Administered 2022-07-16 – 2022-07-20 (×12): 5000 [IU] via SUBCUTANEOUS
  Filled 2022-07-16 (×12): qty 1

## 2022-07-16 MED ORDER — PANTOPRAZOLE SODIUM 40 MG IV SOLR
40.0000 mg | Freq: Every day | INTRAVENOUS | Status: DC
  Administered 2022-07-16 – 2022-07-17 (×2): 40 mg via INTRAVENOUS
  Filled 2022-07-16 (×2): qty 10

## 2022-07-16 MED ORDER — LEVETIRACETAM IN NACL 1000 MG/100ML IV SOLN
1000.0000 mg | Freq: Once | INTRAVENOUS | Status: AC
  Administered 2022-07-16: 1000 mg via INTRAVENOUS
  Filled 2022-07-16: qty 100

## 2022-07-16 MED ORDER — LEVETIRACETAM IN NACL 1500 MG/100ML IV SOLN
1500.0000 mg | Freq: Once | INTRAVENOUS | Status: AC
  Administered 2022-07-16: 1500 mg via INTRAVENOUS
  Filled 2022-07-16: qty 100

## 2022-07-16 MED ORDER — ORAL CARE MOUTH RINSE
15.0000 mL | OROMUCOSAL | Status: DC
  Administered 2022-07-16 – 2022-07-18 (×26): 15 mL via OROMUCOSAL

## 2022-07-16 MED ORDER — DEXTROSE 50 % IV SOLN: 0.0000 mL | INTRAVENOUS | Status: DC | PRN

## 2022-07-16 MED ORDER — FUROSEMIDE 10 MG/ML IJ SOLN
40.0000 mg | Freq: Once | INTRAMUSCULAR | Status: AC
  Administered 2022-07-16: 40 mg via INTRAVENOUS
  Filled 2022-07-16: qty 4

## 2022-07-16 MED ORDER — SUCCINYLCHOLINE CHLORIDE 200 MG/10ML IV SOSY: 80.0000 mg | PREFILLED_SYRINGE | Freq: Once | INTRAVENOUS | Status: AC

## 2022-07-16 MED ORDER — ETOMIDATE 2 MG/ML IV SOLN
INTRAVENOUS | Status: AC
  Administered 2022-07-16: 100 mg via INTRAVENOUS
  Filled 2022-07-16: qty 10

## 2022-07-16 MED ORDER — SODIUM CHLORIDE 0.9 % IV SOLN
3200.0000 mg | Freq: Once | INTRAVENOUS | Status: DC
  Filled 2022-07-16: qty 32

## 2022-07-16 MED ORDER — MIDAZOLAM-SODIUM CHLORIDE 100-0.9 MG/100ML-% IV SOLN
5.0000 mg/h | INTRAVENOUS | Status: DC
  Administered 2022-07-16: 5 mg/h via INTRAVENOUS
  Filled 2022-07-16: qty 100

## 2022-07-16 MED ORDER — PROPOFOL 1000 MG/100ML IV EMUL
INTRAVENOUS | Status: AC
  Administered 2022-07-16: 30 ug/kg/min via INTRAVENOUS
  Filled 2022-07-16: qty 100

## 2022-07-16 MED ORDER — MIDAZOLAM HCL 2 MG/2ML IJ SOLN
2.0000 mg | INTRAMUSCULAR | Status: DC | PRN
  Administered 2022-07-17 – 2022-07-18 (×5): 2 mg via INTRAVENOUS
  Filled 2022-07-16 (×4): qty 2

## 2022-07-16 MED ORDER — DEXAMETHASONE SODIUM PHOSPHATE 4 MG/ML IJ SOLN
4.0000 mg | Freq: Four times a day (QID) | INTRAMUSCULAR | Status: DC
  Administered 2022-07-16 – 2022-07-23 (×29): 4 mg via INTRAVENOUS
  Filled 2022-07-16 (×30): qty 1

## 2022-07-16 MED ORDER — SODIUM CHLORIDE 0.9 % IV BOLUS (SEPSIS)
1000.0000 mL | Freq: Once | INTRAVENOUS | Status: AC
  Administered 2022-07-16: 1000 mL via INTRAVENOUS

## 2022-07-16 MED ORDER — LORAZEPAM 2 MG/ML IJ SOLN
2.0000 mg | Freq: Once | INTRAMUSCULAR | Status: AC
  Administered 2022-07-16: 2 mg via INTRAVENOUS

## 2022-07-16 MED ORDER — PROPOFOL 1000 MG/100ML IV EMUL
5.0000 ug/kg/min | INTRAVENOUS | Status: DC
  Administered 2022-07-16: 20 ug/kg/min via INTRAVENOUS
  Administered 2022-07-16: 5 ug/kg/min via INTRAVENOUS
  Administered 2022-07-17: 35 ug/kg/min via INTRAVENOUS
  Filled 2022-07-16 (×4): qty 100

## 2022-07-16 MED ORDER — INSULIN REGULAR(HUMAN) IN NACL 100-0.9 UT/100ML-% IV SOLN
INTRAVENOUS | Status: DC
  Administered 2022-07-16: 13 [IU]/h via INTRAVENOUS
  Filled 2022-07-16: qty 100

## 2022-07-16 MED ORDER — DEXTROSE IN LACTATED RINGERS 5 % IV SOLN: INTRAVENOUS | Status: DC

## 2022-07-16 MED ORDER — LORAZEPAM 2 MG/ML IJ SOLN: 4.0000 mg | Freq: Once | INTRAMUSCULAR | Status: AC

## 2022-07-16 MED ORDER — ETOMIDATE 2 MG/ML IV SOLN
100.0000 mg | Freq: Once | INTRAVENOUS | Status: AC
  Filled 2022-07-16: qty 50

## 2022-07-16 MED ORDER — PROPOFOL 1000 MG/100ML IV EMUL
5.0000 ug/kg/min | INTRAVENOUS | Status: DC
  Filled 2022-07-16: qty 100

## 2022-07-16 MED ORDER — FENTANYL CITRATE PF 50 MCG/ML IJ SOSY
50.0000 ug | PREFILLED_SYRINGE | INTRAMUSCULAR | Status: AC | PRN
  Administered 2022-07-17 (×3): 50 ug via INTRAVENOUS
  Filled 2022-07-16: qty 1

## 2022-07-16 MED ORDER — LORAZEPAM 2 MG/ML IJ SOLN
INTRAMUSCULAR | Status: AC
  Administered 2022-07-16: 4 mg via INTRAVENOUS
  Filled 2022-07-16: qty 2

## 2022-07-16 MED ORDER — ORAL CARE MOUTH RINSE: 15.0000 mL | OROMUCOSAL | Status: DC | PRN

## 2022-07-16 MED ORDER — LEVETIRACETAM IN NACL 1000 MG/100ML IV SOLN: 1000.0000 mg | Freq: Once | INTRAVENOUS | Status: AC

## 2022-07-16 MED ORDER — CHLORHEXIDINE GLUCONATE CLOTH 2 % EX PADS
6.0000 | MEDICATED_PAD | Freq: Every morning | CUTANEOUS | Status: DC
  Administered 2022-07-17 – 2022-07-28 (×8): 6 via TOPICAL

## 2022-07-16 MED ORDER — LEVETIRACETAM IN NACL 1000 MG/100ML IV SOLN: 1000.0000 mg | Freq: Once | INTRAVENOUS | Status: DC

## 2022-07-16 MED ORDER — LEVETIRACETAM IN NACL 1000 MG/100ML IV SOLN
INTRAVENOUS | Status: AC
  Filled 2022-07-16: qty 100

## 2022-07-16 MED ORDER — FENTANYL CITRATE PF 50 MCG/ML IJ SOSY
50.0000 ug | PREFILLED_SYRINGE | INTRAMUSCULAR | Status: DC | PRN
  Administered 2022-07-17: 200 ug via INTRAVENOUS
  Administered 2022-07-17: 100 ug via INTRAVENOUS
  Administered 2022-07-18 (×2): 200 ug via INTRAVENOUS
  Filled 2022-07-16: qty 4
  Filled 2022-07-16: qty 2
  Filled 2022-07-16: qty 1
  Filled 2022-07-16: qty 2
  Filled 2022-07-16 (×2): qty 4

## 2022-07-16 MED ORDER — CHLORHEXIDINE GLUCONATE CLOTH 2 % EX PADS
6.0000 | MEDICATED_PAD | Freq: Once | CUTANEOUS | Status: AC
  Administered 2022-07-16: 6 via TOPICAL

## 2022-07-16 MED ORDER — DOCUSATE SODIUM 50 MG/5ML PO LIQD
100.0000 mg | Freq: Two times a day (BID) | ORAL | Status: DC
  Administered 2022-07-16 – 2022-07-17 (×4): 100 mg
  Filled 2022-07-16 (×4): qty 10

## 2022-07-16 MED ORDER — SODIUM CHLORIDE 0.9 % IV BOLUS
1000.0000 mL | Freq: Once | INTRAVENOUS | Status: AC
  Administered 2022-07-16: 1000 mL via INTRAVENOUS

## 2022-07-16 MED ORDER — SUCCINYLCHOLINE CHLORIDE 200 MG/10ML IV SOSY
PREFILLED_SYRINGE | INTRAVENOUS | Status: AC
  Administered 2022-07-16: 80 mg via INTRAVENOUS
  Filled 2022-07-16: qty 10

## 2022-07-16 MED ORDER — SODIUM CHLORIDE 0.9 % IV SOLN
200.0000 mg | Freq: Two times a day (BID) | INTRAVENOUS | Status: DC
  Administered 2022-07-16 – 2022-07-18 (×5): 200 mg via INTRAVENOUS
  Filled 2022-07-16 (×6): qty 20

## 2022-07-16 MED ORDER — SODIUM CHLORIDE 0.9 % IV SOLN: INTRAVENOUS | Status: DC

## 2022-07-16 MED ORDER — POLYETHYLENE GLYCOL 3350 17 G PO PACK
17.0000 g | PACK | Freq: Every day | ORAL | Status: DC
  Administered 2022-07-16 – 2022-07-17 (×2): 17 g
  Filled 2022-07-16 (×2): qty 1

## 2022-07-16 MED ORDER — INSULIN ASPART 100 UNIT/ML IJ SOLN
10.0000 [IU] | Freq: Once | INTRAMUSCULAR | Status: AC
  Administered 2022-07-16: 10 [IU] via INTRAVENOUS
  Filled 2022-07-16: qty 1

## 2022-07-16 MED ORDER — SODIUM CHLORIDE 0.9 % IV BOLUS (SEPSIS): 1000.0000 mL | Freq: Once | INTRAVENOUS | Status: DC

## 2022-07-16 MED ORDER — LEVETIRACETAM IN NACL 1000 MG/100ML IV SOLN
1000.0000 mg | Freq: Two times a day (BID) | INTRAVENOUS | Status: DC
  Administered 2022-07-16 – 2022-07-18 (×5): 1000 mg via INTRAVENOUS
  Filled 2022-07-16 (×5): qty 100

## 2022-07-16 NOTE — Procedures (Addendum)
Patient Name: Pamela [name]  MRN: [id number]  Epilepsy Attending: Lora Havens  Referring Physician/Provider: Amie Portland, MD  Duration: 07/16/2022 8338 to 07/17/2022 0523  Patient history: 61 year old female with non-small cell lung cancer with brain mets presented with seizures. EEG to eval for status epilepticus  Level of alertness:  comatose  AEDs during EEG study:  LEV, LCM, Propofol  Technical aspects: This EEG study was done with scalp electrodes positioned according to the 10-20 International system of electrode placement. Electrical activity was reviewed with band pass filter of 1-70Hz , sensitivity of 7 uV/mm, display speed of 57mm/sec with a 60Hz  notched filter applied as appropriate. EEG data were recorded continuously and digitally stored.  Video monitoring was available and reviewed as appropriate.  Description: EEG showed continuous generalized 3 to 7 Hz theta and delta slowing lasting 2 to 4 seconds admixed with 15 to 18 Hz beta activity lasting 5 to 7 seconds. Hyperventilation and photic stimulation were not performed.     Event button was pressed on 07/16/2022 at 1521 during which patient was noted to have bilateral upper extremity twitching.  Concomitant EEG before, during and after the event did not show any EEG changes suggest seizure.  Event button was pressed on 07/16/2022 at 2001 during which patient was noted to have right upper extremity twitching. Concomitant EEG before, during and after the event did not show any EEG changes suggest seizure.  Event button was pressed on 07/16/2022 at 2209 during which patient was noted to have left upper extremity abduction followed by qasi-rhythmic jerking. Concomitant EEG before, during and after the event did not show any EEG changes suggest seizure.  Event button was pressed on 06/28/2022 at 2205 during which patient's left upper extremity was flexed at the elbow and had intermittent twitching. Concomitant EEG before, during  and after the event did not show any EEG changes suggest seizure.   ABNORMALITY - Continuous slow, generalized - Excessive beta, generalized   IMPRESSION: This study is suggestive of severe diffuse encephalopathy, nonspecific etiology but likely related to sedation. No seizures or epileptiform discharges were seen throughout the recording.  Multiple events were recorded as described above without concomitant EEG change.  However, focal motor seizures may not be seen on scalp EEG.  Clinical correlation is recommended.   [name]

## 2022-07-16 NOTE — Consult Note (Addendum)
Neurology Consultation  Reason for Consult: Status epilepticus Referring Physician: Dr. Audria Nine, PCCM  CC: Transferred from [facility] for status epilepticus  History is obtained from: Chart review  HPI: Pamela [name] is a 61 y.o. 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 Marin Olp, presented to [facility] hospital with seizures. Last known well was 8:40 PM on 07/15/2022 and around 10:45 PM, the husband was awakened by dogs and he found the patient in her bathroom having left-sided partial seizures.  She was able to talk to him at that time and told him that she could not see him.  EMS was called.  There arrived little after midnight and that time she was mostly unresponsive with continuous partial seizures.  They reported her blood sugars in the 300s.  She was placed on nonrebreather.  She got a total of 10 mg of Versed but continued to seize. HPI review of ED notes also reveals patient having on and off speech difficulties over the last few days. In the emergency room-sugars were noted to be high in the 800s. CT head revealed areas of edema within the right frontal and parietal lobes and large metastasis in the posterior right frontal lobe with hemorrhage.  No midline shift. I was contacted by the ED provider.  They had given her benzodiazepines as well as loaded with Keppra.  There was concern for continuing seizures even after intubation and being on propofol.  I recommended that they give her 200 mg Vimpat IV and started on Vimpat 200 mg IV twice daily. I also recommended rapid EEG use to rule out status epilepticus.  Preliminary review negative for status epilepticus. She was transferred to [facility] for higher level of care and continuous EEG monitoring.   ROS: Unable to obtain  due to altered mental status.   Past Medical History:  Diagnosis Date   Colon cancer ([place]) 2003   Diabetes mellitus without complication ([place])    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    Hypertension    Personal history of chemotherapy 2003   colon cancer   SCLC (small cell lung carcinoma) ([facility])    with metastasis to liver, brain   Uterine cancer ([place]) 2007     Family History  Problem Relation Age of Onset   Breast cancer Maternal Aunt 60   Breast cancer Paternal Aunt 49   Breast cancer Cousin 18   Breast cancer Cousin 77     Social History:   reports that she quit smoking about 8 months ago. Her smoking use included cigarettes. She has a 43.00 pack-year smoking history. She has never used smokeless tobacco. She reports that she does not currently use alcohol. She reports that she does not use drugs.  Medications  Current Facility-Administered Medications:    docusate (COLACE) 50 MG/5ML liquid 100 mg, 100 mg, Per Tube, BID, Ayesha Rumpf, Stephanie M, PA-C   fentaNYL (SUBLIMAZE) injection 50 mcg, 50 mcg, Intravenous, Q15 min PRN, Nevada Crane M, PA-C   fentaNYL (SUBLIMAZE) injection 50-200 mcg, 50-200 mcg, Intravenous, Q30 min PRN, [place] Crane M, PA-C   heparin injection 5,000 Units, 5,000 Units, Subcutaneous, Q8H, Reese, Stephanie M, PA-C   insulin aspart (novoLOG) injection 0-9 Units, 0-9 Units, Subcutaneous, Q4H, Reese, Stephanie M, PA-C   midazolam (VERSED) injection 2 mg, 2 mg, Intravenous, Q15 min PRN, Lestine Mount, PA-C  midazolam (VERSED) injection 2 mg, 2 mg, Intravenous, Q2H PRN, Nevada Crane M, PA-C   pantoprazole (PROTONIX) injection 40 mg, 40 mg, Intravenous, QHS, Reese, Stephanie M, PA-C   polyethylene glycol (MIRALAX / GLYCOLAX) packet 17 g, 17 g, Per Tube, Daily, Ayesha Rumpf, Stephanie M, PA-C   propofol (DIPRIVAN) 1000 MG/100ML infusion, 5-80 mcg/kg/min, Intravenous, Continuous, Ayesha Rumpf, Stephanie M, [place]   Exam: Current vital signs: Pulse 91   Resp  20   Ht 5\' 2"  (1.575 m)   SpO2 96%   BMI 34.93 kg/m  Vital signs in last 24 hours: Temp:  [97.2 F (36.2 C)-98 F (36.7 C)] 97.2 F (36.2 C) (10/31 0347) Pulse Rate:  [63-98] 91 (10/31 0510) Resp:  [12-32] 20 (10/31 0510) BP: (120-174)/(51-96) 134/60 (10/31 0400) SpO2:  [96 %-100 %] 96 % (10/31 0510) FiO2 (%):  [30 %-35 %] 30 % (10/31 0510) Weight:  [86.6 kg] 86.6 kg (10/30 1104) Extremely limited exam due to sedation with Versed and propofol General: Sedated intubated HEENT: Normocephalic atraumatic Lungs: Vented Cardiovascular: Regular rate rhythm Neurological exam Sedated on propofol and Versed Intubated No spontaneous movements Pupils equal round reactive sluggishly. Corneal reflexes present bilaterally Intermittent breathing over the ventilator Does not blink to threat from either side No spontaneous movements No movement to noxious stimulation   Labs I have reviewed labs in epic and the results pertinent to this consultation are:  CBC    Component Value Date/Time   WBC 11.6 (H) 07/16/2022 0106   RBC 3.38 (L) 07/16/2022 0106   HGB 10.9 (L) 07/16/2022 0106   HGB 10.3 (L) 06/25/2022 0929   HCT 33.4 (L) 07/16/2022 0106   PLT 153 07/16/2022 0106   PLT 134 (L) 06/25/2022 0929   MCV 98.8 07/16/2022 0106   MCH 32.2 07/16/2022 0106   MCHC 32.6 07/16/2022 0106   RDW 15.8 (H) 07/16/2022 0106   LYMPHSABS 0.3 (L) 07/16/2022 0106   MONOABS 0.8 07/16/2022 0106   EOSABS 0.0 07/16/2022 0106   BASOSABS 0.1 07/16/2022 0106    CMP     Component Value Date/Time   NA 126 (L) 07/16/2022 0106   K 6.2 (H) 07/16/2022 0106   CL 89 (L) 07/16/2022 0106   CO2 24 07/16/2022 0106   GLUCOSE 836 (HH) 07/16/2022 0106   BUN 49 (H) 07/16/2022 0106   CREATININE 1.49 (H) 07/16/2022 0106   CREATININE 0.90 06/25/2022 0929   CALCIUM 9.8 07/16/2022 0106   PROT 7.4 07/16/2022 0106   ALBUMIN 3.6 07/16/2022 0106   AST 76 (H) 07/16/2022 0106   AST 44 (H) 06/25/2022 0929   ALT 110 (H)  07/16/2022 0106   ALT 32 06/25/2022 0929   ALKPHOS 226 (H) 07/16/2022 0106   BILITOT 0.7 07/16/2022 0106   BILITOT 0.7 06/25/2022 0929   GFRNONAA 39 (L) 07/16/2022 0106   GFRNONAA >60 06/25/2022 0929   Imaging I have reviewed the images obtained:  CT-head: Multiple hyperdense/hemorrhagic metastasis not as well-visualized as on recent MRI from 07/09/2022 with the largest lesion in the posterior right occipital lobe unchanged from the prior MRI.  Areas of edema within the right frontal and parietal/occipital lobes.  CT C-spine-no acute changes  MRI of the brain with and without contrast 07/09/2022 with numerous intracranial metastases new from prior MRI of July 2023 with largest lesions along the right frontal operculum, right occipital lobe and along medial left temporal lobe.  Edema associated with several lesions.  Most notably mild to moderate edema associated with the lesion along the right  frontal operculum and moderate edema surrounding lesion within the right occipital lobe.  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  CT head shows multiple hemorrhagic mets.  Also hyperglycemic with sugars in the 800s likely contributing to toxic metabolic encephalopathy and lowering of seizure threshold and seizures.  MRI brain with and without contrast from a week ago in October 2023 with concern for not only metastatic brain disease to the brain parenchyma but also leptomeningeal spread.  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  Plan relayed to Dr. Ruthann Cancer and Nevada Crane, PA-C, PCCM.  Neurology will follow  -- Amie [place], MD Neurologist [facility] Pager: [phone]  [place] Performed by: Amie [place], MD Total critical care time: 33 minutes Critical care time was exclusive of separately billable procedures and treating other patients and/or supervising APPs/Residents/Students Critical care was necessary to treat or prevent imminent or life-threatening deterioration due to hemorrhagic brain metastases, new onset seizure, status epilepticus, toxic metabolic encephalopathy This patient is critically ill and at significant risk for neurological worsening and/or death and care requires constant monitoring. Critical care was time spent personally by me on the following activities: development of treatment plan with patient and/or surrogate as well as nursing, discussions with consultants, evaluation of patient's response to treatment, examination of patient, obtaining history from patient or surrogate, ordering and performing treatments and interventions, ordering and review of laboratory studies, ordering and review of radiographic studies,  pulse oximetry, re-evaluation of patient's condition, participation in multidisciplinary rounds and medical decision making of high complexity in the care of this patient.

## 2022-07-16 NOTE — Progress Notes (Signed)
[place] EEG placed on Patient and started. Primary nurse Merrily Pew RN, ED charge Francoise Schaumann, A/C Malachy Mood RN, and Ward DO.

## 2022-07-16 NOTE — Progress Notes (Signed)
Prelim Rapid EEG negative for status epilepticus. Formal read pending.  -- Amie [place], MD Neurologist [facility] Pager: [phone]

## 2022-07-16 NOTE — Progress Notes (Signed)
On-call phone note  Call from: Dr. Leonides Schanz at [facility] ED  Reason: Status epilepticus, hemorrhagic mets.  Patient brought in seizing from home. Non-small cell lung cancer with brain mets-follows with oncology. Loaded with Keppra 60 mg/kg, fosphenytoin 1500 mg and received benzos.  Emergently intubated. In spite of being on propofol, had some focal seizures-left-sided shaking.  CT head with hemorrhagic mets.  No ICU beds available currently at [facility].  Recommendations - Add Vimpat 200 mg twice daily in addition to the already started antiepileptics Keppra and Dilantin - Increase propofol as tolerated at least up to 80 mics per kilogram per minute. - At [facility], rapid EEG is available-I would recommend hooking up to have an EEG. - Maintain seizure precautions - I would also recommend checking it other facilities if any beds become available or considering ED to ED transfer so that a neurological consultation can be obtained as well as continuous EEG can be hooked up-as deemed appropriate by the ED providers.   Inpatient neurology at [facility] will consult on the patient once the patient arrives to [facility].  -- Amie [place], MD Neurologist [facility] Pager: [phone]

## 2022-07-16 NOTE — Progress Notes (Signed)
Pt noted to have some random movements in [place]. Pt's EEG button pushed at the time of the actions. Will conti to monitor.

## 2022-07-16 NOTE — Progress Notes (Signed)
Received notification from patient's daughter, Pamela [name], that patient fell yesterday and had significant seizure activity. She is now admitted and intubated at [facility].   Notified Dr Marin Olp and cancelled today's appointments.   Will continue to follow for post discharge needs and office follow up.   Oncology Nurse Navigator Documentation     07/16/2022    7:45 AM  Oncology Nurse Navigator Flowsheets  Navigator Follow Up Date: 07/19/2022  Navigator Follow Up Reason: Appointment Review  Navigator Location [facility]  Navigator Encounter Type Telephone  Telephone Incoming Call  Patient Visit Type MedOnc  Treatment Phase Active Tx  Barriers/Navigation Needs Coordination of Care;Education  Education Other  Interventions Psycho-Social Support  Acuity Level 2-Minimal Needs (1-2 Barriers Identified)  Support Groups/Services Friends and Family  Time Spent with Patient 15

## 2022-07-16 NOTE — Progress Notes (Signed)
Inpatient Diabetes Program Recommendations  AACE/ADA: New Consensus Statement on Inpatient Glycemic Control (2015)  Target Ranges:  Prepandial:   less than 140 mg/dL      Peak postprandial:   less than 180 mg/dL (1-2 hours)      Critically ill patients:  140 - 180 mg/dL   Lab Results  Component Value Date   GLUCAP 168 (H) 07/16/2022   HGBA1C 8.9 (H) 07/16/2022    Review of Glycemic Control  Latest Reference Range & Units 07/16/22 02:06 07/16/22 02:23 07/16/22 03:10 07/16/22 04:00  Glucose-Capillary 70 - 99 mg/dL 205 (H) 216 (H) 182 (H) 168 (H)   Diabetes history:  DM 2 Outpatient Diabetes medications:  Janumet 50-500 mg bid Current orders for Inpatient glycemic control:  IV insulin Decadron 4 mg every 6 hours  Inpatient Diabetes Program Recommendations:    Note admit CBG>800 mg/dL.  A1C indicates average glucose levels of 217 mg/dL. Hyperglycemia likely exacerbated due to Decadron.  At this point may be best to continue IV insulin until more stable.  For transition, consider use of ICU hyperglycemia order set phase 3?    Thanks,  Adah Perl, RN, BC-ADM Inpatient Diabetes Coordinator Pager [phone]

## 2022-07-16 NOTE — Assessment & Plan Note (Signed)
Likely secondary to cerebral metastases from SCLC Clinical and EEG seizure control. -Stop propofol today - Continue Keppra and Vimpat.

## 2022-07-16 NOTE — ED Triage Notes (Signed)
Pt arrived via [facility] from home after family found pt in bathroom floor having seizure like activity, unknown down time or seizure activity. EMS on scene found pt seizing in floor, was given 2.5mg  Versed IV and then seizure continued. Pt given 2.5mg  Versed in EMS truck due to continued seizing. Pts oxygen desat to 89% was placed on 6L nasal cannula and then placed onto non-re breather due to continued  drop in oxygen. Pt given 5mg  Versed in route to ED by EMS for a total of 10mg  by EMS.

## 2022-07-16 NOTE — ED Provider Notes (Signed)
[facility] Provider Note    Event Date/Time   First MD Initiated Contact with Patient 07/16/22 0032     (approximate)   History   Seizures   HPI  Pamela [name] is a 61 y.o. female with history of hypertension, diabetes, small cell lung cancer status post Carboplatinum/etoposide/ Atezolizumab and then lurbinectedin followed by Dr. Marin Olp with oncology with known metastasis to the liver with recent diagnosis of brain metastasis who presents to the emergency department with seizures.  Family states that they last saw her around 8:40 PM before husband went to bed acting normally.  Family reports over the past several days she has had intermittent slurred speech that would resolve spontaneously.  Husband states at about 10:45 PM his dogs woke him up and he found her in the bathroom having left-sided partial seizures.  She was able to talk to him at that time and told him that she could not see him.  EMS states that they arrived around 12:10 AM and at that time she was mostly unresponsive with continued left-sided partial seizures.  They report her blood glucose was in the 300s.  Today placed her on a nonrebreather.  They gave her a total of 10 mg of IV Versed but she continued to seize.  It appears per her notes patient was initially diagnosed with lung cancer in early of 2023.  She has seen Dr. Baruch Gouty with plans for whole brain radiation the family states has not been started yet.  She is on Decadron.  Family reports patient does have advanced directives and that she is a full code.   History provided by EMS and family.    Past Medical History:  Diagnosis Date   Colon cancer ([place]) 2003   Diabetes mellitus without complication ([place])    Hypertension    Personal history of chemotherapy 2003   colon cancer   Uterine cancer ([place]) 2007    Past Surgical History:  Procedure Laterality Date   ABDOMINAL HYSTERECTOMY     BREAST EXCISIONAL BIOPSY Right 11/26/2010    neg/- PROLIFERATIVE FIBROCYSTIC CHANGE WITH ASSOCIATED    CHOLECYSTECTOMY     COLON SURGERY     2003   COLONOSCOPY     COLONOSCOPY WITH PROPOFOL N/A 12/11/2018   Procedure: COLONOSCOPY WITH PROPOFOL;  Surgeon: Lollie Sails, MD;  Location: [facility] ENDOSCOPY;  Service: Endoscopy;  Laterality: N/A;   ESOPHAGOGASTRODUODENOSCOPY (EGD) WITH PROPOFOL N/A 01/15/2022   Procedure: ESOPHAGOGASTRODUODENOSCOPY (EGD) WITH PROPOFOL;  Surgeon: Lesly Rubenstein, MD;  Location: [facility] ENDOSCOPY;  Service: Endoscopy;  Laterality: N/A;  DM   IR IMAGING GUIDED PORT INSERTION  11/20/2021    MEDICATIONS:  Prior to Admission medications   Medication Sig Start Date End Date Taking? Authorizing Provider  albuterol (VENTOLIN HFA) 108 (90 Base) MCG/ACT inhaler 2 puffs every 6 (six) hours as needed for wheezing or shortness of breath. 01/22/21   [provider]  ciprofloxacin (CIPRO) 500 MG tablet Take 1 tablet (500 mg total) by mouth daily with breakfast. Patient not taking: Reported on 05/27/2022 12/07/21   [name], MD  dexamethasone (DECADRON) 4 MG tablet Take 2 tablets (8 mg total) by mouth daily. Start the day after chemotherapy for 2 days. Take with food. 05/06/22   [name], MD  dexamethasone (DECADRON) 4 MG tablet Take 1 tablet (4 mg total) by mouth 3 (three) times daily. 07/10/22   Celso Amy, NP  diclofenac Sodium (VOLTAREN) 1 % GEL Apply topically  4 (four) times daily.    [provider]  esomeprazole (NEXIUM) 20 MG capsule Take 20 mg by mouth daily at 12 noon.    [provider]  famotidine (PEPCID) 40 MG tablet Take 1 tablet (40 mg total) by mouth 2 (two) times daily. 07/10/22   Celso Amy, NP  fentaNYL (DURAGESIC) 12 MCG/HR Place 1 patch onto the skin every 3 (three) days. 11/02/21   Borders, Kirt Boys, NP  fluconazole (DIFLUCAN) 100 MG tablet Take 1 tablet (100 mg total) by mouth daily. 07/10/22   Celso Amy, NP  furosemide (LASIX) 40 MG tablet Take 1  tablet (40 mg total) by mouth daily. 11/21/21 11/21/22  Edwin Dada, MD  levothyroxine (SYNTHROID) 125 MCG tablet TAKE 1 TABLET(125 MCG) BY MOUTH DAILY BEFORE BREAKFAST 05/17/22   [name], MD  lidocaine-prilocaine (EMLA) cream Apply to affected area once 05/06/22   [name], MD  Multiple Vitamin (MULTIVITAMIN) tablet Take 1 tablet by mouth daily.    [provider]  naloxone [facility]) nasal spray 4 mg/0.1 mL 1 spray as needed (overdose). 11/02/21   [provider]  ondansetron (ZOFRAN) 8 MG tablet Take 1 tablet (8 mg total) by mouth every 8 (eight) hours as needed for nausea or vomiting. Start on the third day after chemotherapy. 05/06/22   [name], MD  Oxycodone HCl 10 MG TABS Take 1 tablet (10 mg total) by mouth every 3 (three) hours as needed (breakthrough pain). 11/02/21   Borders, Kirt Boys, NP  potassium chloride SA (KLOR-CON M) 20 MEQ tablet TAKE 2 TABLETS BY MOUTH TWICE DAILY 07/04/22   [name], MD  prochlorperazine (COMPAZINE) 10 MG tablet Take 1 tablet (10 mg total) by mouth every 6 (six) hours as needed for nausea or vomiting. 05/06/22   [name], MD  senna-docusate (SENOKOT-S) 8.6-50 MG tablet Take 1 tablet by mouth 2 (two) times daily. 11/20/21   Mercy Riding, MD  simvastatin (ZOCOR) 40 MG tablet Take 40 mg by mouth every evening.    [provider]  sitaGLIPtin-metformin (JANUMET) 50-500 MG tablet Take 1 tablet by mouth 2 (two) times daily with a meal.    [provider]    Physical Exam   Triage Vital Signs: ED Triage Vitals [07/16/22 0100]  Enc Vitals Group     BP 120/78     Pulse Rate 80     Resp (!) 32     Temp      Temp src      SpO2 100 %     Weight      Height      Head Circumference      Peak Flow      Pain Score      Pain Loc      Pain Edu?      Excl. in [place]?     Most recent vital signs: Vitals:   07/16/22 0347 07/16/22 0400  BP: (!) 131/51 134/60  Pulse: 96 98  Resp: (!) 27 19   Temp: (!) 97.2 F (36.2 C)   SpO2: 100% 100%    CONSTITUTIONAL: Patient is unresponsive.  Having active left-sided partial seizures with left gaze preference. HEAD: Normocephalic, atraumatic EYES: Pupils appear normal and equal, left gaze preference, no scleral icterus ENT: normal nose; moist mucous membranes NECK: Supple, normal ROM CARD: Regular and tachycardic; S1 and S2 appreciated; no murmurs, no clicks, no rubs, no gallops RESP: Patient slightly tachypneic,  on nonrebreather, clear breath sounds bilaterally ABD/GI: Normal bowel sounds; non-distended; soft BACK: The back appears normal EXT: no deformity noted, no edema; no cyanosis SKIN: Normal color for age and race; warm; no rash on exposed skin NEURO: Patient having partial seizures of the left upper and lower extremity with left gaze preference.  Not able to answer questions, follow commands.  Eyes open.    ED Results / Procedures / Treatments   LABS: (all labs ordered are listed, but only abnormal results are displayed) Labs Reviewed  URINALYSIS, ROUTINE W REFLEX MICROSCOPIC - Abnormal; Notable for the following components:      Result Value   Color, Urine STRAW (*)    APPearance CLEAR (*)    Glucose, UA >=500 (*)    All other components within normal limits  COMPREHENSIVE METABOLIC PANEL - Abnormal; Notable for the following components:   Sodium 126 (*)    Potassium 6.2 (*)    Chloride 89 (*)    Glucose, Bld 836 (*)    BUN 49 (*)    Creatinine, Ser 1.49 (*)    AST 76 (*)    ALT 110 (*)    Alkaline Phosphatase 226 (*)    GFR, Estimated 39 (*)    All other components within normal limits  CBC WITH DIFFERENTIAL/PLATELET - Abnormal; Notable for the following components:   WBC 11.6 (*)    RBC 3.38 (*)    Hemoglobin 10.9 (*)    HCT 33.4 (*)    RDW 15.8 (*)    Neutro Abs 9.9 (*)    Lymphs Abs 0.3 (*)    Abs Immature Granulocytes 0.56 (*)    All other components within normal limits  URINE DRUG SCREEN,  QUALITATIVE ([facility] ONLY) - Abnormal; Notable for the following components:   Benzodiazepine, Ur Scrn POSITIVE (*)    All other components within normal limits  BLOOD GAS, ARTERIAL - Abnormal; Notable for the following components:   pO2, Arterial 150 (*)    All other components within normal limits  APTT - Abnormal; Notable for the following components:   aPTT 20 (*)    All other components within normal limits  APTT - Abnormal; Notable for the following components:   aPTT 20 (*)    All other components within normal limits  CBG MONITORING, ED - Abnormal; Notable for the following components:   Glucose-Capillary >600 (*)    All other components within normal limits  CBG MONITORING, ED - Abnormal; Notable for the following components:   Glucose-Capillary >600 (*)    All other components within normal limits  CBG MONITORING, ED - Abnormal; Notable for the following components:   Glucose-Capillary 499 (*)    All other components within normal limits  SARS CORONAVIRUS 2 BY RT PCR  ETHANOL  PROTIME-INR  MAGNESIUM     EKG:    Date: 07/16/2022 00:41  Rate: 77  Rhythm: normal sinus rhythm  QRS Axis: normal  Intervals: normal  ST/T Wave abnormalities: normal  Conduction Disutrbances: none  Narrative Interpretation: unremarkable     RADIOLOGY: My personal review and interpretation of imaging: CT head shows multiple hemorrhagic metastases largest in the right posterior occipital lobe with edema in the right frontal and parietal/occipital lobes.  I have personally reviewed all radiology reports.   CT Cervical Spine Wo Contrast  Result Date: 07/16/2022 CLINICAL DATA:  Neck trauma, mechanically unstable spine (Age >= 16y). Seizure EXAM: CT CERVICAL SPINE WITHOUT CONTRAST TECHNIQUE: Multidetector CT imaging of the cervical  spine was performed without intravenous contrast. Multiplanar CT image reconstructions were also generated. RADIATION DOSE REDUCTION: This exam was performed  according to the departmental dose-optimization program which includes automated exposure control, adjustment of the mA and/or kV according to patient size and/or use of iterative reconstruction technique. COMPARISON:  None Available. FINDINGS: Alignment: Normal Skull base and vertebrae: No acute fracture. No primary bone lesion or focal pathologic process. Soft tissues and spinal canal: No prevertebral fluid or swelling. No visible canal hematoma. Disc levels:  Mild degenerative disc disease at C5-6 and C6-7. Upper chest: No acute findings Other: None IMPRESSION: No acute bony abnormality. Electronically Signed   By: Rolm Baptise M.D.   On: 07/16/2022 01:49   CT HEAD WO CONTRAST (5MM)  Result Date: 07/16/2022 CLINICAL DATA:  Seizure, new-onset, no history of trauma. History of small cell lung cancer. EXAM: CT HEAD WITHOUT CONTRAST TECHNIQUE: Contiguous axial images were obtained from the base of the skull through the vertex without intravenous contrast. RADIATION DOSE REDUCTION: This exam was performed according to the departmental dose-optimization program which includes automated exposure control, adjustment of the mA and/or kV according to patient size and/or use of iterative reconstruction technique. COMPARISON:  MRI 07/09/2022 FINDINGS: Brain: Multiple metastases again noted as seen on prior MRI. Hyperdense mass within the right occipital lobe measuring up to 2.6 cm, stable since recent MRI. Hyperdense lesion in the medial right frontal lobe measures 10 mm, stable. The numerous other metastases seen on prior MRI not as well visualized. Vasogenic edema seen within the right frontal and posterior parietal/occipital lobes. No hydrocephalus or midline shift. Vascular: No hyperdense vessel or unexpected calcification. Skull: No acute calvarial abnormality. Sinuses/Orbits: No acute findings Other: None IMPRESSION: Multiple hyperdense/hemorrhagic metastases, not as well visualized as on prior/recent MRI. The  largest lesion in the posterior right occipital lobe 2.6 cm, unchanged. Areas of edema within the right frontal and parietal/occipital lobes. Electronically Signed   By: Rolm Baptise M.D.   On: 07/16/2022 01:48   DG Abdomen 1 View  Result Date: 07/16/2022 CLINICAL DATA:  OG tube placement EXAM: ABDOMEN - 1 VIEW COMPARISON:  CT 07/09/2022 FINDINGS: OG tube tip is in the mid stomach. Nonobstructive bowel gas pattern. Prior CABG. IMPRESSION: OG tube in the mid stomach. Electronically Signed   By: Rolm Baptise M.D.   On: 07/16/2022 01:43   DG Chest Portable 1 View  Result Date: 07/16/2022 CLINICAL DATA:  Intubation EXAM: PORTABLE CHEST 1 VIEW COMPARISON:  11/21/2021 FINDINGS: Right Port-A-Cath remains in place, unchanged. Endotracheal tube is 2 cm above the carina. NG tube is in the stomach. Mild elevation of the right hemidiaphragm. Heart and mediastinal contours are within normal limits. No focal opacities or effusions. No acute bony abnormality. Aortic atherosclerosis. IMPRESSION: Support devices as above. No active cardiopulmonary disease. Electronically Signed   By: Rolm Baptise M.D.   On: 07/16/2022 01:43     PROCEDURES:  Critical Care performed: Yes, see critical care procedure note(s)   CRITICAL CARE Performed by: Pryor Curia   Total critical care time: 95 minutes  Critical care time was exclusive of separately billable procedures and treating other patients.  Critical care was necessary to treat or prevent imminent or life-threatening deterioration.  Critical care was time spent personally by me on the following activities: development of treatment plan with patient and/or surrogate as well as nursing, discussions with consultants, evaluation of patient's response to treatment, examination of patient, obtaining history from patient or surrogate, ordering and performing treatments  and interventions, ordering and review of laboratory studies, ordering and review of radiographic  studies, pulse oximetry and re-evaluation of patient's condition.   Procedure Name: Intubation Date/Time: 07/16/2022 12:33 AM  Performed by: Mikia Delaluz, Delice Bison, DOPre-anesthesia Checklist: Patient identified, Patient being monitored, Timeout performed, Emergency Drugs available and Suction available Oxygen Delivery Method: Non-rebreather mask Preoxygenation: Pre-oxygenation with 100% oxygen Induction Type: Rapid sequence Ventilation: Mask ventilation without difficulty Laryngoscope Size: Glidescope and 3 Grade View: Grade I Tube size: 7.5 mm Number of attempts: 1 Placement Confirmation: ETT inserted through vocal cords under direct vision, CO2 detector, Breath sounds checked- equal and bilateral and Positive ETCO2 Secured at: 20 cm Tube secured with: ETT holder        IMPRESSION / MDM / [place] / ED COURSE  I reviewed the triage vital signs and the nursing notes.    Patient here in the emergency department with status epilepticus.  It sounds like she has been seizing for almost 2 hours between what husband has seen and EMS.  They report there has been no cessation of seizure-like activity.  The patient is on the cardiac monitor to evaluate for evidence of arrhythmia and/or significant heart rate changes.   DIFFERENTIAL DIAGNOSIS (includes but not limited to):   Status epilepticus, intracranial hemorrhage, worsening edema from metastasis, CVA, electrolyte derangement, less likely sepsis   Patient's presentation is most consistent with acute presentation with potential threat to life or bodily function.   PLAN: We will obtain CBC, CMP, urinalysis, ethanol, urine drug screen CT head and cervical spine.  Decision made to intubate patient upon arrival due to status epilepticus.  She has already received 10 mg of IV Versed with EMS and continues to seize.  Patient immediately given 2 separate doses of Ativan 4 mg IV without cessation of seizure and 1 g of Keppra.   Intubation performed without difficulty and patient sedated on propofol infusion.   MEDICATIONS GIVEN IN ED: Medications  sodium chloride 0.9 % bolus 1,000 mL (0 mLs Intravenous Stopped 07/16/22 0402)    And  0.9 %  sodium chloride infusion ( Intravenous Transfusing/Transfer 07/16/22 0437)  propofol (DIPRIVAN) 1000 MG/100ML infusion ( Intravenous Canceled Entry 07/16/22 0300)  insulin regular, human (MYXREDLIN) 100 units/ 100 mL infusion (13 Units/hr Intravenous Rate/Dose Change 07/16/22 0401)  lactated ringers infusion ( Intravenous New Bag/Given 07/16/22 0228)  dextrose 5 % in lactated ringers infusion (0 mLs Intravenous Hold 07/16/22 0206)  dextrose 50 % solution 0-50 mL (has no administration in time range)  midazolam (VERSED) 100 mg/100 mL (1 mg/mL) premix infusion (5 mg/hr Intravenous New Bag/Given 07/16/22 0237)  lacosamide (VIMPAT) 200 mg in sodium chloride 0.9 % 25 mL IVPB (0 mg Intravenous Stopped 07/16/22 0438)  LORazepam (ATIVAN) injection 2 mg (2 mg Intravenous Given 07/16/22 0040)  levETIRAcetam (KEPPRA) IVPB 1000 mg/100 mL premix (0 mg Intravenous Stopped 07/16/22 0055)  succinylcholine (ANECTINE) syringe 80 mg (80 mg Intravenous Given 07/16/22 0041)  etomidate (AMIDATE) injection 100 mg (100 mg Intravenous Given 07/16/22 0040)  LORazepam (ATIVAN) injection 2 mg (2 mg Intravenous Given 07/16/22 0035)  levETIRAcetam (KEPPRA) IVPB 1000 mg/100 mL premix (0 mg Intravenous Stopped 07/16/22 0158)  fosPHENYtoin (CEREBYX) 1,500 mg PE in sodium chloride 0.9 % 50 mL IVPB (0 mg PE Intravenous Stopped 07/16/22 0158)  LORazepam (ATIVAN) injection 4 mg (4 mg Intravenous Given 07/16/22 0055)  levETIRAcetam (KEPPRA) IVPB 1500 mg/ 100 mL premix (0 mg Intravenous Stopped 07/16/22 0257)    Followed by  levETIRAcetam (KEPPRA)  IVPB 1500 mg/ 100 mL premix (0 mg Intravenous Stopped 07/16/22 0257)  sodium chloride 0.9 % bolus 1,000 mL (0 mLs Intravenous Stopped 07/16/22 0403)  furosemide (LASIX)  injection 40 mg (40 mg Intravenous Given 07/16/22 0224)  insulin aspart (novoLOG) injection 10 Units (10 Units Intravenous Given 07/16/22 0323)     ED COURSE: Patient continuing to seize despite Keppra, Ativan, Versed, propofol.  Patient to receive another 4 g of IV Keppra for a total of 60 mg/kg, 1500 mg of IV fosphenytoin.  We will continue decrease propofol as needed and monitor blood pressure closely.   CT head and cervical spine reviewed and interpreted by myself and the radiologist and shows no acute changes.  She does have areas of edema within the right frontal and parietal lobes and a large metastasis in the posterior right occipital lobe.  No midline shift.  No cervical spine injury.  Chest x-ray also reviewed/interpreted by myself and the radiologist and shows no acute abnormality, ET tube in place.  Patient continued to have seizures.  I have started her on Versed infusion and discussed with neurology at [facility] who recommends giving Vimpat.  Patient has also been seen by Domingo Pulse Chest Rust NP with critical care here who agrees patient needs transfer to a tertiary care center.   It appears that she is having breaks in between episodes of left upper and lower extremity movement per nursing staff.  No longer has a gaze preference.  Will place rapid EEG on patient per neuro recommendations.   Also, patient's labs show hyperglycemia without DKA.  Likely from being on Decadron with history of diabetes.  She has gotten 2 L IV fluid bolus and insulin infusion.  She is hyperkalemic with AKI today.  She has received IV fluids, insulin bolus, Lasix for her hyperkalemia.  No EKG changes.  Corrected sodium is 138.  Patient has not required any blood pressure support here in the emergency department.  3:40 AM  [facility] at bedside.  Discussed with Rory Percy.  No active seizures on EEG at this time.   Family has been updated at length regarding patient's status.  They have been brought to the  bedside to see patient in the ED.  They report at this time she is still full code.  CONSULTS: Discussed case with Dr. Rory Percy with neurology at [facility].  He agrees with antiepileptics already given and recommends giving Vimpat 200 mg now and then twice daily.  Recommends hooking up rapid EEG.  I have contacted the ICU charge nurse to have this done immediately.  No current neuro ICU beds available at [facility].  I have reached out to [facility] as well and they are at full capacity.  I did discuss the case with Dr. Audria Nine with critical care.  She is aware of patient.  Initially discussed the case with Dr. Teressa Lower in the emergency department who agreed to accept patient ED to ED but fortunately they were able to hold a neuro ICU bed for [name] at [facility] and so now patient will go directly to the ICU.  Appreciate everyone's assistance with this patient.    OUTSIDE RECORDS REVIEWED: Reviewed patient's multiple oncology and radiation oncology notes.       FINAL CLINICAL IMPRESSION(S) / ED DIAGNOSES   Final diagnoses:  Status epilepticus ([place])  Hyperglycemia  Hyperkalemia     Rx / DC Orders   ED Discharge Orders     None  Note:  This document was prepared using Dragon voice recognition software and may include unintentional dictation errors.   Camile Esters, Delice Bison, DO 07/16/22 [phone]

## 2022-07-16 NOTE — ED Notes (Signed)
Patient transported to CT 

## 2022-07-16 NOTE — H&P (Signed)
NAME:  Pamela [name], MRN:  [id number], DOB:  02-Nov-1960, LOS: 0 ADMISSION DATE:  07/16/2022 CONSULTATION DATE:  07/16/2022 REFERRING MD:  [facility] CHIEF COMPLAINT:  Seizures   History of Present Illness:  61 year old woman who presented to [facility] ED 10/30 via EMS after being found down at home with seizure-like activity. LKW 2040, found around 2245 having L-sided partial seizures and hyperglycemic to 300s. Recent transient spontaenously-resolving episodes of slurred speech at home per family. PMHx significant for HTN, T2DM, hypothyroidism, SCLC (s/p chemotherapy, followed by Dr. Marin Olp) with known liver metastasis and recently diagnosed brain metastasis with plan for whole brain radiation (not yet started).   On EMS arrival, patient was reportedly mostly unresponsive with ongoing L-sided partial seizure activity. Versed IV (25m total) administered without cessation of seizure. Labs were notable for WBC 11.6, H&H/Plt WNL, hyponatremia with Na 126, K 6.2, serum glucose 836, BUN 49, Cr 1.49 (baseline 0.9). LFTs elevated with AST/ALT 76/110, Alk Phos 226. INR WNL. UA with > 500 glucose; UDS +BZD (s/p Ativan/Versed). Ethanol negative. [facility]; Propofol gtt + Versed initiated. Neuro consulted; rapid EEG completed. Keppra load given + fosphenytoin. CT Head with multiple hyperdense/hemorrhagic metastases (largest 2.6cm, unchanged) with edema of R frontal and parietal/occipital lobes. CT C-spine negative.   [facility] PCCM to admit.  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 Intubated on arrival to ED for airway protection. CT Head with known hemorrhagic brain mets. Hyperglycemic to 600s. [facility]   Interim History / Subjective:  PCCM consulted for admission  Objective:  Pulse 91, resp. rate 20, height _0  (1.575 m), SpO2 96 %.    Vent Mode: PRVC FiO2 (%):  [30 %-35 %] 30 % Set Rate:  [20 bmp] 20 bmp Vt Set:  [450 mL] 450 mL PEEP:  [5 cmH20] 5 cmH20 Plateau Pressure:  [17 cmH20] 17 cmH20  No intake or output data in the 24 hours ending 07/16/22 0517 There were no vitals filed for this visit.  Physical Examination: General: Acute-on-chronically ill-appearing middle-aged woman in NAD. Intubated, sedated. HEENT: [place]/AT, anicteric sclera, PERRL 3.571m+ hippus, moist mucous membranes. ETT/OGT in place. LTM EEG leads in place. Neuro: Sedated. Does not respond to verbal, tactile or noxious stimuli. Withdraws to pain in all 4 extremities. Not following commands. No spontaneous movement of extremities witnessed during exam. +Corneal, +Cough, and +Gag  CV: RRR, no m/g/r. PULM: Breathing even and unlabored on vent (PEEP 5, FiO2 30%). Lung fields with scattered rhonchi, R > L. GI: Soft, nontender, nondistended. Normoactive bowel sounds. Extremities: No LE edema noted. Skin: Warm/dry, no rashes.  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 - Admit to Neuro ICU - Neurology consulted, following - AEDs per Neuro (Keppra, Vimpat) - LTM EEG -  Seizure precautions - Further brain imaging per Neuro - Possible LP, pending further workup - Continue Decadron Q6H - NSGY, Oncology consults 10/31AM - Neuroprotective measures: HOB > 30 degrees, normoglycemia, normothermia, correct lytes  Acute hypoxemic respiratory failure in the setting of seizures - Continue full vent support (4-8cc/kg IBW) - Wean FiO2 for O2 sat > 90% - Daily WUA/SBT - VAP bundle - Pulmonary hygiene - PAD protocol for sedation: Propofol and Versed for goal RASS -1 to -2  Hyperglycemia, likely in the setting of Decadron T2DM - Insulin gtt with Endotool - Q1H CBGs until within range - Goal CBG 140-180 - F/u A1C  AKI secondary to likely severe hyperglycemia versus ATN Hyperkalemia, likely in the setting of hyperglycemia Hyponatremia - Trend BMP - Replete electrolytes as indicated - Monitor I&Os - Avoid nephrotoxic agents as able - Ensure adequate renal perfusion  Hypothyroidism - Continue home levothyroxine  SCLC with metastasis to liver, brain History of colon cancer Recent diagnosis of brain metastasis with plan for whole brain radiation. S/p chemotherapy. - Oncology and [place] consults as above - Would benefit from ongoing [place] discussions for family/patient support in the setting of metastatic disease  Best Practice: (right click and "Reselect all SmartList Selections" daily)   Diet/type: NPO DVT prophylaxis: prophylactic heparin  GI prophylaxis: PPI Lines: Port, R upper chest Foley:  Yes, and it is still needed Code Status:  full code Last date of multidisciplinary goals of care discussion [Pending]  Labs:  CBC: Recent Labs  Lab 07/16/22 0106  WBC 11.6*  NEUTROABS 9.9*  HGB 10.9*  HCT 33.4*  MCV 98.8  PLT 762   Basic Metabolic Panel: Recent Labs  Lab 07/16/22 0106  NA 126*  K 6.2*  CL 89*  CO2 24  GLUCOSE 836*  BUN 49*  CREATININE 1.49*  CALCIUM 9.8  MG 1.9   GFR: Estimated Creatinine Clearance: 40 mL/min (A) (by C-G  formula based on SCr of 1.49 mg/dL (H)). Recent Labs  Lab 07/16/22 0106  WBC 11.6*   Liver Function Tests: Recent Labs  Lab 07/16/22 0106  AST 76*  ALT 110*  ALKPHOS 226*  BILITOT 0.7  PROT 7.4  ALBUMIN 3.6   No results for input(s): "LIPASE", "AMYLASE" in the last 168 hours. No results for input(s): "AMMONIA" in the last 168 hours.  ABG:    Component Value Date/Time   PHART 7.4 07/16/2022 0106   PCO2ART 40 07/16/2022 0106   PO2ART 150 (H) 07/16/2022 0106   HCO3 24.8 07/16/2022 0106   O2SAT 99.3 07/16/2022 0106   Coagulation Profile: Recent Labs  Lab 07/16/22 0105  INR 1.2   Cardiac Enzymes: No results for input(s): "CKTOTAL", "CKMB", "CKMBINDEX", "TROPONINI" in the last 168 hours.  HbA1C: Hgb A1c MFr Bld  Date/Time Value Ref Range Status  11/16/2021 02:53 PM 5.8 (H) 4.8 - 5.6 % Final    Comment:    (NOTE) Pre diabetes:          5.7%-6.4%  Diabetes:              >6.4%  Glycemic control for   <7.0% adults with diabetes    CBG: Recent Labs  Lab 07/16/22 0206 07/16/22 0310 07/16/22 0400 07/16/22 0504  GLUCAP >600* >600* 499* 354*   Review of Systems:  Patient is encephalopathic and/or intubated. Therefore history has been obtained from chart review.   Past Medical History:  She,  has a past medical history of Colon cancer ([place]) (2003), Diabetes mellitus without complication ([place]), 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, Hypertension, Personal history of chemotherapy (2003), SCLC (small cell lung carcinoma) ([place]), and Uterine cancer ([place]) (2007).   Surgical History:   Past Surgical History:  Procedure Laterality Date   ABDOMINAL HYSTERECTOMY     BREAST EXCISIONAL BIOPSY Right 11/26/2010   neg/- PROLIFERATIVE FIBROCYSTIC CHANGE WITH ASSOCIATED    CHOLECYSTECTOMY     COLON SURGERY     2003   COLONOSCOPY     COLONOSCOPY WITH PROPOFOL N/A 12/11/2018   Procedure: COLONOSCOPY WITH PROPOFOL;  Surgeon: Lollie Sails, MD;  [facility]  ENDOSCOPY;  Service: Endoscopy;  Laterality: N/A;   ESOPHAGOGASTRODUODENOSCOPY (EGD) WITH PROPOFOL N/A 01/15/2022   Procedure: ESOPHAGOGASTRODUODENOSCOPY (EGD) WITH PROPOFOL;  Surgeon: Lesly Rubenstein, MD;  Location: [facility] ENDOSCOPY;  Service: Endoscopy;  Laterality: N/A;  DM   IR IMAGING GUIDED PORT INSERTION  11/20/2021   Social History:   reports that she quit smoking about 8 months ago. Her smoking use included cigarettes. She has a 43.00 pack-year smoking history. She has never used smokeless tobacco. She reports that she does not currently use alcohol. She reports that she does not use drugs.   Family History:  Her family history includes Breast cancer (age of onset: 8) in her cousin; Breast cancer (age of onset: 6) in her cousin; Breast cancer (age of onset: 16) in her paternal aunt; Breast cancer (age of onset: 66) in her maternal aunt.   Allergies: Allergies  Allergen Reactions   Rofecoxib Swelling   Home Medications: Prior to Admission medications   Medication Sig Start Date End Date Taking? Authorizing Provider  albuterol (VENTOLIN HFA) 108 (90 Base) MCG/ACT inhaler 2 puffs every 6 (six) hours as needed for wheezing or shortness of breath. 01/22/21   [provider]  ciprofloxacin (CIPRO) 500 MG tablet Take 1 tablet (500 mg total) by mouth daily with breakfast. Patient not taking: Reported on 05/27/2022 12/07/21   [name], MD  dexamethasone (DECADRON) 4 MG tablet Take 2 tablets (8 mg total) by mouth daily. Start the day after chemotherapy for 2 days. Take with food. 05/06/22   [name], MD  dexamethasone (DECADRON) 4 MG tablet Take 1 tablet (4 mg total) by mouth 3 (three) times daily. 07/10/22   Celso Amy, NP  diclofenac Sodium (VOLTAREN) 1 % GEL Apply topically 4 (four) times daily.    [provider]  esomeprazole (NEXIUM) 20 MG capsule Take 20 mg by mouth daily at 12 noon.    [provider]  famotidine (PEPCID) 40 MG tablet Take 1  tablet (40 mg total) by mouth 2 (two) times daily. 07/10/22   Celso Amy, NP  fentaNYL (DURAGESIC) 12 MCG/HR Place 1 patch onto the skin every 3 (three) days. 11/02/21   Borders, Kirt Boys, NP  fluconazole (DIFLUCAN) 100 MG tablet Take 1 tablet (100 mg total) by mouth daily. 07/10/22   Celso Amy, NP  furosemide (LASIX) 40 MG tablet Take 1 tablet (40 mg total) by mouth daily. 11/21/21 11/21/22  Edwin Dada, MD  levothyroxine (SYNTHROID) 125 MCG tablet TAKE 1 TABLET(125 MCG) BY MOUTH DAILY BEFORE BREAKFAST 05/17/22   [name], MD  lidocaine-prilocaine (EMLA) cream Apply to affected area once 05/06/22   Marin Olp, Rudell Cobb, MD  Multiple Vitamin (MULTIVITAMIN) tablet Take 1 tablet by mouth daily.    [provider]  naloxone [facility]) nasal spray 4 mg/0.1 mL 1 spray as needed (overdose). 11/02/21   [provider]  ondansetron (ZOFRAN) 8 MG tablet Take 1 tablet (8 mg total) by mouth every 8 (eight) hours as needed for nausea or vomiting. Start on the third day after chemotherapy. 05/06/22   [name], MD  Oxycodone HCl 10 MG TABS Take 1 tablet (10 mg total) by mouth every 3 (three) hours as needed (breakthrough pain). 11/02/21   Borders, Kirt Boys, NP  potassium chloride SA (KLOR-CON M) 20 MEQ tablet TAKE 2 TABLETS BY MOUTH TWICE DAILY 07/04/22   [name], MD  prochlorperazine (COMPAZINE) 10 MG tablet Take 1 tablet (10 mg total) by mouth every 6 (six) hours as needed for nausea or vomiting. 05/06/22   [name], MD  senna-docusate (SENOKOT-S) 8.6-50 MG tablet Take 1 tablet by mouth 2 (two) times daily. 11/20/21   Mercy Riding, MD  simvastatin (ZOCOR) 40 MG tablet Take 40 mg by mouth every evening.    [provider]  sitaGLIPtin-metformin (JANUMET) 50-500 MG tablet Take 1 tablet by mouth 2 (two) times daily with a meal.    [provider]    Critical care time:    The patient is critically ill with multiple organ system failure and  requires high complexity decision making for assessment and support, frequent evaluation and titration of therapies, advanced monitoring, review of radiographic studies and interpretation of complex data.   Critical Care Time devoted to patient care services, exclusive of separately billable procedures, described in this note is 41 minutes.  Lestine Mount, PA-C [facility] 07/16/22 6:12 AM  Please see [email or web address] for pager details.  From 7A-7P if no response, please call [phone] After hours, please call ELink [phone]

## 2022-07-16 NOTE — Assessment & Plan Note (Signed)
Recently diagnosed - Decadron - Radiation therapy on hold.   -Appreciate Dr. Antonieta Pert input.

## 2022-07-16 NOTE — Progress Notes (Signed)
LTM EEG hooked up and running - no initial skin breakdown - push button tested - [facility] monitoring.  

## 2022-07-16 NOTE — H&P (Signed)
NAME:  Pamela [name], MRN:  [id number], DOB:  26-Jul-1961, LOS: 0 ADMISSION DATE:  07/16/2022 CONSULTATION DATE:  07/16/2022 REFERRING MD:  [facility] CHIEF COMPLAINT:  Seizures   History of Present Illness:  61 year old woman who presented to [facility] ED 10/30 via EMS after being found down at home with seizure-like activity. LKW 2040, found around 2245 having L-sided partial seizures and hyperglycemic to 300s. Recent transient spontaenously-resolving episodes of slurred speech at home per family. PMHx significant for HTN, T2DM, hypothyroidism, SCLC (s/p chemotherapy, followed by Dr. Marin Olp) with known liver metastasis and recently diagnosed brain metastasis with plan for whole brain radiation (not yet started).   On EMS arrival, patient was reportedly mostly unresponsive with ongoing L-sided partial seizure activity. Versed IV (44m total) administered without cessation of seizure. Labs were notable for WBC 11.6, H&H/Plt WNL, hyponatremia with Na 126, K 6.2, serum glucose 836, BUN 49, Cr 1.49 (baseline 0.9). LFTs elevated with AST/ALT 76/110, Alk Phos 226. INR WNL. UA with > 500 glucose; UDS +BZD (s/p Ativan/Versed). Ethanol negative. [facility]; Propofol gtt + Versed initiated. Neuro consulted; rapid EEG completed. Keppra load given + fosphenytoin. CT Head with multiple hyperdense/hemorrhagic metastases (largest 2.6cm, unchanged) with edema of R frontal and parietal/occipital lobes. CT C-spine negative.   [facility] PCCM to admit.  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 Intubated on arrival to ED for airway protection. CT Head with known hemorrhagic brain mets. Hyperglycemic to 600s. 10[facility]   Interim History / Subjective:  No further clinical seizures. Some tremulousness with stimulation.  Apparently seized from 10am to 2am  Objective:  Blood pressure 132/70, pulse 82, temperature 99.2 F (37.3 C), temperature source Oral, resp. rate 20, height _0  (1.575 m), SpO2 99 %.    Vent Mode: PRVC FiO2 (%):  [30 %-35 %] 30 % Set Rate:  [20 bmp] 20 bmp Vt Set:  [450 mL] 450 mL PEEP:  [5 cmH20] 5 cmH20 Plateau Pressure:  [17 cmH20] 17 cmH20   Intake/Output Summary (Last 24 hours) at 07/16/2022 0815 Last data filed at 07/16/2022 0700 Gross per 24 hour  Intake 115.93 ml  Output 650 ml  Net -534.07 ml   There were no vitals filed for this visit.  Physical Examination: General: Acute-on-chronically ill-appearing middle-aged woman in NAD. Intubated, sedated.  Cachexia.  HEENT: [place]/AT, anicteric sclera, PERRL 3.539m+ hippus, moist mucous membranes. ETT/OGT in place. LTM EEG leads in place. Neuro: Sedated. No response to verbal commands. Localizes to pain in all 4 extremities.  +Corneal, +Cough, and +Gag  CV: RRR, no m/g/r. PULM: Breathing even and unlabored on vent (PEEP 5, FiO2 30%). Lung fields are now clear.  GI: Soft, nontender, nondistended. Normoactive bowel sounds. Extremities: No LE edema noted. Skin: Warm/dry, no rashes.  Continuous EEG shows generalized slowing without clear seizures, but no periods of  suppression.  Ancillary tests personally reviewed:  Hyperglycemia Creatinine improving 1.33 WBC: 22.5 Mild transaminitis CXR clear.  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 - Continue propofol at current rate and would maintain control for 24h prior to weaning.  - Continue Keppra and Vimpat.   Acute hypoxemic respiratory failure (HCC) Due to inability to protect airway  - Continue full mechanical ventilatory support.  - Mental status will dictate timing of extubation.   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    SCLC (small cell lung carcinoma) (HCC) Diagnosed 8 months ago at stage IV- Limited pathway forward. Needs to recover from status epilepticus rapidly for her to have a realistic chance to get back to treatment .    Best Practice: (right click and "Reselect all SmartList Selections" daily)   Diet/type: NPO DVT prophylaxis: prophylactic heparin  GI prophylaxis: PPI Lines: Port, R upper chest Foley:  Yes, and it is still needed Code Status:  full code Last date of multidisciplinary goals of care discussion [Pending]  CRITICAL CARE Performed by: Kipp Brood   Total critical care time: 40 minutes  Critical care time was exclusive of separately billable procedures and treating other patients.  Critical care was necessary to treat or prevent imminent or life-threatening deterioration.  Critical care was time spent personally by me on the following activities: development of treatment plan with patient and/or surrogate as well as nursing, discussions with consultants, evaluation of patient's response to treatment, examination of patient, obtaining history from patient or surrogate, ordering and performing treatments and interventions, ordering and review of laboratory studies, ordering and review of radiographic studies, pulse oximetry, re-evaluation of patient's condition and  participation in multidisciplinary rounds.  Kipp Brood, MD [facility] ICU Physician [place]  Pager: [phone] Mobile: [phone] After hours: [phone].

## 2022-07-16 NOTE — Assessment & Plan Note (Signed)
Diagnosed 8 months ago at stage IV- Limited pathway forward. Needs to recover from status epilepticus rapidly for her to have a realistic chance to get back to treatment .

## 2022-07-16 NOTE — Procedures (Addendum)
Patient Name: Pamela [name]  MRN: [id number]  Epilepsy Attending: Lora Havens  Referring Physician/Provider: Nyra Jabs, DO Duration: 07/16/2022 5885 to 0406  Patient history: 61 year old female with non-small cell lung cancer with brain mets presented with seizures. Rapid EEG to eval for status epilepticus  Level of alertness: comatose  AEDs during EEG study: LEV, LCM, Propofol  Technical aspects: This EEG was obtained using a 10 lead EEG system positioned circumferentially without any parasagittal coverage (rapid EEG). Computer selected EEG is reviewed as  well as background features and all clinically significant events.  Description: EEG showed continuous generalized 3 to 7 Hz theta and delta slowing lasting 2 to 4 seconds admixed with 15 to 18 Hz beta activity lasting 5 to 7 seconds. Hyperventilation and photic stimulation were not performed.     ABNORMALITY - Continuous slow, generalized - Excessive beta, generalized  IMPRESSION: This rapid eeg is suggestive of severe diffuse encephalopathy, nonspecific etiology but likely related to sedation. No seizures or epileptiform discharges were seen throughout the recording.  [name] Barbra Sarks

## 2022-07-16 NOTE — Progress Notes (Signed)
  Transition of Care (TOC) Screening Note   Patient Details  Name: Pamela [name] Date of Birth: Jan 07, 1961   Transition of Care [facility]) CM/SW Contact:    Benard Halsted, LCSW Phone Number: 07/16/2022, 5:05 PM    Transition of Care Department [facility]) has reviewed patient and no TOC needs have been identified at this time. We will continue to monitor patient advancement through interdisciplinary progression rounds. If new patient transition needs arise, please place a TOC consult.

## 2022-07-16 NOTE — Progress Notes (Signed)
Ceribell removed for transport out of facility.

## 2022-07-16 NOTE — Assessment & Plan Note (Signed)
Due to steroids for vasogenic edema and uncontrolled type 2 DM  - Currently on insulin infusion - Once controlled, transition to basal insulin, tube feed coverage and correction SSI

## 2022-07-16 NOTE — Assessment & Plan Note (Signed)
Due to inability to protect airway  -Tolerating weaning today - Mental status will dictate timing of extubation: We will stop propofol and allow patient to wake up.  Given her periods of agitation may need a transition to Precedex.

## 2022-07-16 NOTE — Care Plan (Signed)
Event button was pressed at 1522. No eeg change. Most likely not an epileptic event. Please review final report for details.    Venda Dice Barbra Sarks

## 2022-07-16 NOTE — Progress Notes (Signed)
On call neurologist note  Called by [facility] monitor x2 for arm jerking reported by RN (push button events marked on EEG). On my prelim review, no EEG correlate.  Continue current care.  Decrease sedation if 24h seizure free.  Neurology (Dr. Hortense Ramal to follow).  -- Amie Portland, MD Neurologist [facility] Pager: [phone]

## 2022-07-17 DIAGNOSIS — Z87891 Personal history of nicotine dependence: Secondary | ICD-10-CM

## 2022-07-17 DIAGNOSIS — Z90710 Acquired absence of both cervix and uterus: Secondary | ICD-10-CM

## 2022-07-17 DIAGNOSIS — I10 Essential (primary) hypertension: Secondary | ICD-10-CM

## 2022-07-17 DIAGNOSIS — Z803 Family history of malignant neoplasm of breast: Secondary | ICD-10-CM

## 2022-07-17 DIAGNOSIS — C7931 Secondary malignant neoplasm of brain: Secondary | ICD-10-CM

## 2022-07-17 DIAGNOSIS — R569 Unspecified convulsions: Secondary | ICD-10-CM | POA: Diagnosis not present

## 2022-07-17 DIAGNOSIS — Z9911 Dependence on respirator [ventilator] status: Secondary | ICD-10-CM | POA: Diagnosis not present

## 2022-07-17 DIAGNOSIS — C3490 Malignant neoplasm of unspecified part of unspecified bronchus or lung: Secondary | ICD-10-CM | POA: Diagnosis not present

## 2022-07-17 DIAGNOSIS — G40901 Epilepsy, unspecified, not intractable, with status epilepticus: Secondary | ICD-10-CM | POA: Diagnosis not present

## 2022-07-17 DIAGNOSIS — J9601 Acute respiratory failure with hypoxia: Secondary | ICD-10-CM | POA: Diagnosis not present

## 2022-07-17 LAB — CBC
HCT: 28.4 % — ABNORMAL LOW (ref 36.0–46.0)
Hemoglobin: 9.4 g/dL — ABNORMAL LOW (ref 12.0–15.0)
MCH: 32.8 pg (ref 26.0–34.0)
MCHC: 33.1 g/dL (ref 30.0–36.0)
MCV: 99 fL (ref 80.0–100.0)
Platelets: 122 10*3/uL — ABNORMAL LOW (ref 150–400)
RBC: 2.87 MIL/uL — ABNORMAL LOW (ref 3.87–5.11)
RDW: 15.9 % — ABNORMAL HIGH (ref 11.5–15.5)
WBC: 12.2 10*3/uL — ABNORMAL HIGH (ref 4.0–10.5)
nRBC: 0 % (ref 0.0–0.2)

## 2022-07-17 LAB — GLUCOSE, CAPILLARY
Glucose-Capillary: 130 mg/dL — ABNORMAL HIGH (ref 70–99)
Glucose-Capillary: 146 mg/dL — ABNORMAL HIGH (ref 70–99)
Glucose-Capillary: 147 mg/dL — ABNORMAL HIGH (ref 70–99)
Glucose-Capillary: 151 mg/dL — ABNORMAL HIGH (ref 70–99)
Glucose-Capillary: 157 mg/dL — ABNORMAL HIGH (ref 70–99)
Glucose-Capillary: 161 mg/dL — ABNORMAL HIGH (ref 70–99)
Glucose-Capillary: 175 mg/dL — ABNORMAL HIGH (ref 70–99)
Glucose-Capillary: 176 mg/dL — ABNORMAL HIGH (ref 70–99)
Glucose-Capillary: 217 mg/dL — ABNORMAL HIGH (ref 70–99)
Glucose-Capillary: 235 mg/dL — ABNORMAL HIGH (ref 70–99)
Glucose-Capillary: 248 mg/dL — ABNORMAL HIGH (ref 70–99)
Glucose-Capillary: 99 mg/dL (ref 70–99)

## 2022-07-17 LAB — TRIGLYCERIDES: Triglycerides: 138 mg/dL (ref <150)

## 2022-07-17 LAB — BASIC METABOLIC PANEL
Anion gap: 12 (ref 5–15)
BUN: 24 mg/dL — ABNORMAL HIGH (ref 8–23)
CO2: 24 mmol/L (ref 22–32)
Calcium: 8.5 mg/dL — ABNORMAL LOW (ref 8.9–10.3)
Chloride: 102 mmol/L (ref 98–111)
Creatinine, Ser: 1.02 mg/dL — ABNORMAL HIGH (ref 0.44–1.00)
GFR, Estimated: >60 mL/min (ref >60)
Glucose, Bld: 148 mg/dL — ABNORMAL HIGH (ref 70–99)
Potassium: 3.3 mmol/L — ABNORMAL LOW (ref 3.5–5.1)
Sodium: 138 mmol/L (ref 135–145)

## 2022-07-17 LAB — MAGNESIUM: Magnesium: 1.8 mg/dL (ref 1.7–2.4)

## 2022-07-17 LAB — PHOSPHORUS: Phosphorus: 3.8 mg/dL (ref 2.5–4.6)

## 2022-07-17 MED ORDER — INSULIN ASPART 100 UNIT/ML IJ SOLN
2.0000 [IU] | INTRAMUSCULAR | Status: DC
  Administered 2022-07-17: 2 [IU] via SUBCUTANEOUS
  Administered 2022-07-17 (×2): 4 [IU] via SUBCUTANEOUS
  Administered 2022-07-18: 3 [IU] via SUBCUTANEOUS
  Administered 2022-07-18 (×2): 6 [IU] via SUBCUTANEOUS
  Administered 2022-07-19 (×2): 4 [IU] via SUBCUTANEOUS
  Administered 2022-07-20 (×2): 2 [IU] via SUBCUTANEOUS

## 2022-07-17 MED ORDER — DEXMEDETOMIDINE HCL IN NACL 400 MCG/100ML IV SOLN
0.4000 ug/kg/h | INTRAVENOUS | Status: DC
  Administered 2022-07-17: 0.4 ug/kg/h via INTRAVENOUS
  Administered 2022-07-17: 1 ug/kg/h via INTRAVENOUS
  Administered 2022-07-17: 0.6 ug/kg/h via INTRAVENOUS
  Administered 2022-07-18: 1 ug/kg/h via INTRAVENOUS
  Filled 2022-07-17 (×4): qty 100

## 2022-07-17 MED ORDER — POTASSIUM CHLORIDE 10 MEQ/100ML IV SOLN
10.0000 meq | INTRAVENOUS | Status: AC
  Administered 2022-07-17 (×6): 10 meq via INTRAVENOUS
  Filled 2022-07-17 (×6): qty 100

## 2022-07-17 MED ORDER — INSULIN DETEMIR 100 UNIT/ML ~~LOC~~ SOLN
18.0000 [IU] | Freq: Two times a day (BID) | SUBCUTANEOUS | Status: DC
  Administered 2022-07-17 (×2): 18 [IU] via SUBCUTANEOUS
  Filled 2022-07-17 (×4): qty 0.18

## 2022-07-17 MED ORDER — LORAZEPAM 2 MG/ML IJ SOLN
2.0000 mg | INTRAMUSCULAR | Status: DC | PRN
  Administered 2022-07-19: 2 mg via INTRAVENOUS
  Filled 2022-07-17 (×3): qty 1

## 2022-07-17 NOTE — Progress Notes (Signed)
Pharmacy Electrolyte Replacement  Recent Labs:  Recent Labs    07/17/22 0601  K 3.3*  MG 1.8  PHOS 3.8  CREATININE 1.02*    Low Critical Values (K </= 2.5, Phos </= 1, Mg </= 1) Present: None  Plan: Replete per protocol with 68mEq IV x 1. Recheck with AM labs.

## 2022-07-17 NOTE — Consult Note (Signed)
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 : Patient is sedated and intubated.  HPI: Pamela [name] is well-known to me.  She is a very charming 61 year old white female.  I am absolutely devastated over what is happened.  Patient presented back in February with extensive stage small cell lung cancer.  She had impending liver failure.  We are able to treat her and improve her outcome at that time.  Her liver function normalized.  She had a very nice response to treatment.  At the time of initial presentation, there were no CNS metastasis.  She has had MRIs in the past.  Unfortunately, she progressed relatively quickly.  When we did a follow-up CT scan on her in August and unfortunately, she was found to have progressive disease.  We had her on treatment with lurbinectedin.  She had done well with this treatment wise.  She did not have a lot of side effects.  However, on her recent CT scan that was done a couple weeks or so ago, she again had progressive disease.  She was complaining of some visual changes.  We did an MRI of the brain and this showed extensive CNS metastasis.  She had seen Radiation Oncology in [place].  They had her set up to start radiation therapy.  However, 2 days ago, she apparently fell and has seizures.  She was taken to the local hospital.  She was intubated.  She was then brought over to [facility].  She is on sedation right now.  She is being followed by neurology.  Of note, her blood sugar was 836 when she came in.  She is on Keppra right now.  Hopefully, she has been seizure-free for right now.  It is hard to say what kind of status she is going to have when she recovers from this.  I think that the real problem that were going to have is that she needs radiotherapy for the brain.  I am not sure how this will be done or if it can be done with her on the ventilator.  She also has  systemic disease that is progressing.  Her bilirubin, which is a measure that I utilize for her disease burden is still okay.  Her labs today show a sodium 138.  Potassium 3.3.  BUN 24 creatinine 1.02.  Calcium is 8.5.  Her white cell count is 12.2.  Hemoglobin 9.4.  Platelet count 122,000.  I know that she has disease that is obviously aggressive.  She had a great response to the upfront chemotherapy but the response was very short-lived.  She was even on maintenance immunotherapy.  Again it is hard to say what kind of neurological function she is going to have if she comes out of this seizure event.  Again, I am not sure if she is going to be able to take any kind of radiation therapy.  I am unsure how that would be able to be done if she is on a ventilator.  As far systemic therapy is concerned, there really are not a lot of options that we would have.  At this point, I had thought about getting a consultation from one of the academic medical centers for a clinical trial.  She, unfortunately, we will not be a candidate for a clinical trial because of the CNS disease.  The 1 option that would have systemically would be utilizing cisplatin/irinotecan.  Again, she is in no condition to take anything like that at the present state.   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 ENDOSCOPY;  Service: Endoscopy;  Laterality: N/A;   ESOPHAGOGASTRODUODENOSCOPY (EGD) WITH PROPOFOL N/A 01/15/2022   Procedure: ESOPHAGOGASTRODUODENOSCOPY (EGD) WITH PROPOFOL;  Surgeon: Lesly Rubenstein, MD;  Location: [facility] ENDOSCOPY;  Service: Endoscopy;  Laterality: N/A;  DM   IR IMAGING GUIDED PORT INSERTION  11/20/2021  :   Current Facility-Administered Medications:    Chlorhexidine Gluconate Cloth 2 % PADS 6 each, 6 each, Topical, q morning, Amie Portland, MD, 6 each at 07/17/22 0339   dexamethasone (DECADRON) injection 4 mg, 4 mg, Intravenous, Q6H, Amie Portland, MD, 4 mg at 07/17/22 0531   dextrose 5 % in lactated ringers infusion, , Intravenous, Continuous, Nevada Crane M, PA-C, Last Rate: 125 mL/hr at 07/17/22 0600, Infusion Verify at 07/17/22 0600   dextrose 50 % solution 0-50 mL, 0-50 mL, Intravenous, PRN, Ayesha Rumpf, Stephanie M, PA-C   docusate (COLACE) 50 MG/5ML liquid 100 mg, 100 mg, Per Tube, BID, Nevada Crane M, PA-C, 100 mg at 07/16/22 2144   fentaNYL (SUBLIMAZE) injection 50 mcg, 50 mcg, Intravenous, Q15 min PRN, Ayesha Rumpf, Stephanie M, PA-C   fentaNYL (SUBLIMAZE) injection 50-200 mcg, 50-200 mcg, Intravenous, Q30 min PRN, Nevada Crane M, PA-C   heparin injection 5,000 Units, 5,000 Units, Subcutaneous, Q8H, Nevada Crane M, PA-C, 5,000 Units at 07/17/22 0531   insulin regular, human (MYXREDLIN) 100 units/ 100 mL infusion, , Intravenous, Continuous, Nevada Crane M, PA-C, Last Rate: 3.2 mL/hr at 07/17/22 0600, 3.2 Units/hr at 07/17/22 0600   lacosamide (VIMPAT) 200 mg in sodium chloride 0.9 % 25 mL IVPB, 200 mg, Intravenous, Q12H, Amie Portland, MD, Stopped at 07/16/22 2218   lactated ringers infusion, , Intravenous, Continuous, Nevada Crane M, PA-C, Paused at 07/16/22 5284   levETIRAcetam (KEPPRA) IVPB 1000 mg/100 mL premix, 1,000 mg, Intravenous, BID, Amie Portland, MD, Paused at 07/16/22 2026   midazolam (VERSED)  injection 2 mg, 2 mg, Intravenous, Q15 min PRN, Nevada Crane M, PA-C   midazolam (VERSED) injection 2 mg, 2 mg, Intravenous, Q2H PRN, Nevada Crane M, PA-C, 2 mg at 07/17/22 0302   Oral care mouth rinse, 15 mL, Mouth Rinse, Q2H, Agarwala, Ravi, MD, 15 mL at 07/17/22 0530   Oral care mouth rinse, 15 mL, Mouth Rinse, PRN, Agarwala, Ravi, MD   pantoprazole (PROTONIX) injection 40 mg, 40 mg, Intravenous, QHS, Reese, Stephanie M, PA-C, 40 mg at 07/16/22 2144   polyethylene glycol (MIRALAX / GLYCOLAX) packet 17 g, 17 g, Per Tube, Daily, Nevada Crane M, PA-C, 17 g at 07/16/22 1143   propofol (DIPRIVAN) 1000 MG/100ML infusion, 5-80 mcg/kg/min, Intravenous, Continuous, Lestine Mount, PA-C, Last  Rate: 18.19 mL/hr at 07/17/22 0600, 35 mcg/kg/min at 07/17/22 0600:   Chlorhexidine Gluconate Cloth  6 each Topical q morning   dexamethasone (DECADRON) injection  4 mg Intravenous Q6H   docusate  100 mg Per Tube BID   heparin  5,000 Units Subcutaneous Q8H   mouth rinse  15 mL Mouth Rinse Q2H   pantoprazole (PROTONIX) IV  40 mg Intravenous QHS   polyethylene glycol  17 g Per Tube Daily  :   Allergies  Allergen Reactions   Rofecoxib Swelling  :   Family History  Problem Relation Age of Onset   Breast cancer Maternal Aunt 60   Breast cancer Paternal Aunt 15   Breast cancer Cousin 18   Breast cancer Cousin 31  :   Social History   Socioeconomic History   Marital status: Married    Spouse name: Not on file   Number of children: Not on file   Years of education: Not on file   Highest education level: Not on file  Occupational History   Not on file  Tobacco Use   Smoking status: Former    Packs/day: 1.00    Years: 43.00    Total pack years: 43.00    Types: Cigarettes    Quit date: 10/17/2021    Years since quitting: 0.7   Smokeless tobacco: Never  Vaping Use   Vaping Use: Never used  Substance and Sexual Activity   Alcohol use: Not Currently   Drug use: Never   Sexual  activity: Not on file  Other Topics Concern   Not on file  Social History Narrative   Not on file   Social Determinants of Health   Financial Resource Strain: Low Risk  (07/10/2022)   Overall Financial Resource Strain (CARDIA)    Difficulty of Paying Living Expenses: Not very hard  Food Insecurity: No Food Insecurity (07/10/2022)   Hunger Vital Sign    Worried About Running Out of Food in the Last Year: Never true    Ran Out of Food in the Last Year: Never true  Transportation Needs: No Transportation Needs (07/10/2022)   PRAPARE - Hydrologist (Medical): No    Lack of Transportation (Non-Medical): No  Physical Activity: Not on file  Stress: Not on file  Social Connections: Not on file  Intimate Partner Violence: Not At Risk (07/10/2022)   Humiliation, Afraid, Rape, and Kick questionnaire    Fear of Current or Ex-Partner: No    Emotionally Abused: No    Physically Abused: No    Sexually Abused: No  :  Pertinent items are noted in HPI.  Exam: Patient Vitals for the past 24 hrs:  BP Temp Temp src Pulse Resp SpO2 Weight  07/17/22 0600 (!) 102/56 -- -- -- -- -- --  07/17/22 0500 (!) 100/53 -- -- 65 20 99 % --  07/17/22 0400 (!) 162/77 97.9 F (36.6 C) Axillary -- -- -- --  07/17/22 0302 (!) 155/76 -- -- 81 20 98 % --  07/17/22 0300 (!) 155/76 -- -- 93 18 98 % 168 lb 3.4 oz (76.3 kg)  07/17/22 0200 (!) 159/81 -- -- 99 13 93 % --  07/17/22 0137 -- -- -- -- -- 98 % --  07/17/22 0100 (!) 113/58 -- -- 61 20 99 % --  07/17/22 0000 110/61 98.2 F (36.8 C) Axillary 64 20 98 % --  07/16/22 2300 (!) 115/55 -- -- 68 20 96 % --  07/16/22  2200 (!) 154/83 -- -- 91 20 98 % --  07/16/22 2100 (!) 148/69 -- -- 71 20 96 % --  07/16/22 2000 (!) 160/85 98.6 F (37 C) Axillary 93 20 98 % --  07/16/22 1900 -- -- -- -- -- 98 % --  07/16/22 1800 121/60 -- -- 68 20 98 % --  07/16/22 1700 120/60 -- -- 71 20 99 % --  07/16/22 1600 138/66 98.5 F (36.9 C) Axillary 70  20 98 % --  07/16/22 1500 (!) 140/77 -- -- 73 19 97 % --  07/16/22 1400 130/62 -- -- 73 20 95 % --  07/16/22 1300 125/63 -- -- 70 20 99 % --  07/16/22 1200 (!) 145/84 97.9 F (36.6 C) Axillary 83 20 97 % --  07/16/22 1100 109/64 -- -- 71 20 98 % --  07/16/22 1000 (!) 112/58 -- -- 72 20 97 % --  07/16/22 0900 111/60 -- -- 74 20 100 % --  07/16/22 0800 130/62 97.8 F (36.6 C) Axillary 76 20 99 % --  07/16/22 0700 132/70 -- -- 82 20 99 % --   Physical Exam Vitals reviewed.  Constitutional:      Comments: Currently, the patient is intubated and sedated.  HENT:     Head: Normocephalic and atraumatic.  Eyes:     Pupils: Pupils are equal, round, and reactive to light.  Cardiovascular:     Rate and Rhythm: Normal rate and regular rhythm.     Heart sounds: Normal heart sounds.  Pulmonary:     Effort: Pulmonary effort is normal.     Breath sounds: Normal breath sounds.  Abdominal:     General: Bowel sounds are normal.     Palpations: Abdomen is soft.  Musculoskeletal:        General: No tenderness or deformity. Normal range of motion.     Cervical back: Normal range of motion.  Lymphadenopathy:     Cervical: No cervical adenopathy.  Skin:    General: Skin is warm and dry.     Findings: No erythema or rash.  Neurological:     Mental Status: She is alert and oriented to person, place, and time.  Psychiatric:        Behavior: Behavior normal.        Thought Content: Thought content normal.        Judgment: Judgment normal.     Recent Labs    07/16/22 0706 07/17/22 0601  WBC 22.5* 12.2*  HGB 11.1* 9.4*  HCT 32.9* 28.4*  PLT 182 122*    Recent Labs    07/16/22 0706 07/17/22 0601  NA 139 138  K 4.1 3.3*  CL 95* 102  CO2 24 24  GLUCOSE 295* 148*  BUN 37* 24*  CREATININE 1.33* 1.02*  CALCIUM 9.7 8.5*    Blood smear review: None  Pathology: None    Assessment and Plan: Pamela [name] is a very nice 61 year old white female.  She has extensive stage small cell lung  cancer which has progressed.  She has had progression despite 2 lines of systemic chemotherapy along with some immunotherapy.  She now has extensive CNS disease.  I saw that there is some possible concern of leptomeningeal disease.  I will know if she would need to have any lumbar puncture to see if there is meningeal involvement.  I really believe that our recommendations will really be based upon her status if she does recover from this seizure and has some  type of meaningful quality of life.  I know it is still early on.  I know that the ICU staff are doing a fantastic job in trying to help her and trying to improve her neurological condition.  Her labs look okay right now.  We will just have to monitor her for right now.  Again her blood sugars certainly be an issue.  I will have to get hold of the family and just talk with them about the situation and what we might be looking at in the future.  I do appreciate everybody's help with taking care of Pamela [name].  I know that she is getting the best care possible.  [name], MD  Oswaldo Milian 41:10

## 2022-07-17 NOTE — Progress Notes (Signed)
Inpatient Diabetes Program Recommendations  AACE/ADA: New Consensus Statement on Inpatient Glycemic Control (2015)  Target Ranges:  Prepandial:   less than 140 mg/dL      Peak postprandial:   less than 180 mg/dL (1-2 hours)      Critically ill patients:  140 - 180 mg/dL   Lab Results  Component Value Date   GLUCAP 130 (H) 07/17/2022   HGBA1C 8.9 (H) 07/16/2022    Review of Glycemic Control  Latest Reference Range & Units 07/17/22 05:26 07/17/22 06:33 07/17/22 08:57 07/17/22 11:23  Glucose-Capillary 70 - 99 mg/dL 147 (H) 157 (H) 175 (H) 130 (H)  Diabetes history: DM 2 Outpatient Diabetes medications:  Janumet 50-500 mg bid Current orders for Inpatient glycemic control:  IV insulin transitioning to Novolog 2-6 q 4 hours and Levemir 18 units bid Decadron 4 mg IV q 6 hours Inpatient Diabetes Program Recommendations:    Agree with orders.  If tube feeds added, will need tube feed coverage as well.   Thanks,  Adah Perl, RN, BC-ADM Inpatient Diabetes Coordinator Pager [phone]  (8a-5p)

## 2022-07-17 NOTE — Progress Notes (Signed)
RT placed pt in CPAP/PS 12/5 pt tolerating well with SVS. RT will continue to monitor pt.

## 2022-07-17 NOTE — Progress Notes (Signed)
RT was called by RN to bedside due to pt's ventilator alarm "apenia". RT at bedside and placed pt back on full ventilatory support.

## 2022-07-17 NOTE — Procedures (Signed)
Patient Name: Pamela [name]  MRN: [id number]  Epilepsy Attending: Lora Havens  Referring Physician/Provider: Amie Portland, MD  Duration: 07/17/2022 0523 to 07/18/2022 0523   Patient history: 61 year old female with non-small cell lung cancer with brain mets presented with seizures. EEG to eval for status epilepticus   Level of alertness: lethargic, asleep   AEDs during EEG study:  LEV, LCM, Propofol   Technical aspects: This EEG study was done with scalp electrodes positioned according to the 10-20 International system of electrode placement. Electrical activity was reviewed with band pass filter of 1-70Hz , sensitivity of 7 uV/mm, display speed of 69mm/sec with a 60Hz  notched filter applied as appropriate. EEG data were recorded continuously and digitally stored.  Video monitoring was available and reviewed as appropriate.   Description: During awake state, no clear posterior dominant rhythm was seen. Sleep was characterized by sleep spindles (12-14hz ), maximal fronto-central region. EEG showed continuous generalized 3 to 7 Hz theta and delta slowing.  Polyspikes were noted in left frontal region.  Sharp waves are also noted in right temporoparietal region. Hyperventilation and photic stimulation were not performed.       ABNORMALITY - Polyspikes, left frontal region - Sharp wave, right temporoparietal region - Continuous slow, generalized   IMPRESSION: This study showed evidence of independent epileptogenicity arising from left frontal region and right temporoparietal region.  Additionally there is moderate to severe diffuse encephalopathy, nonspecific etiology. No seizures were seen throughout the recording.   [name] Barbra Sarks

## 2022-07-17 NOTE — Progress Notes (Signed)
NAME:  Pamela [name], MRN:  [id number], DOB:  07-Jul-1961, LOS: 1 ADMISSION DATE:  07/16/2022 CONSULTATION DATE:  07/16/2022 REFERRING MD:  [facility] CHIEF COMPLAINT:  Seizures   History of Present Illness:  61 year old woman who presented to [facility] ED 10/30 via EMS after being found down at home with seizure-like activity. LKW 2040, found around 2245 having L-sided partial seizures and hyperglycemic to 300s. Recent transient spontaenously-resolving episodes of slurred speech at home per family. PMHx significant for HTN, T2DM, hypothyroidism, SCLC (s/p chemotherapy, followed by Dr. Marin Olp) with known liver metastasis and recently diagnosed brain metastasis with plan for whole brain radiation (not yet started).   On EMS arrival, patient was reportedly mostly unresponsive with ongoing L-sided partial seizure activity. Versed IV (76m total) administered without cessation of seizure. Labs were notable for WBC 11.6, H&H/Plt WNL, hyponatremia with Na 126, K 6.2, serum glucose 836, BUN 49, Cr 1.49 (baseline 0.9). LFTs elevated with AST/ALT 76/110, Alk Phos 226. INR WNL. UA with > 500 glucose; UDS +BZD (s/p Ativan/Versed). Ethanol negative. [facility]; Propofol gtt + Versed initiated. Neuro consulted; rapid EEG completed. Keppra load given + fosphenytoin. CT Head with multiple hyperdense/hemorrhagic metastases (largest 2.6cm, unchanged) with edema of R frontal and parietal/occipital lobes. CT C-spine negative.   [facility] PCCM to admit.  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 Intubated on arrival to ED for airway protection. CT Head with known hemorrhagic brain mets. Hyperglycemic to 600s. [facility]  11/1 episodes of jerking seen without EEG correlate.  Interim History / Subjective:  Continues to have some tremulousness with stimulation without EEG evidence of seizures.  Placed on SBT this morning.  Objective:  Blood pressure (!) 129/51, pulse 83, temperature 97.9 F (36.6 C), temperature source Axillary, resp. rate 17, height _0  (1.575 m), weight 76.3 kg, SpO2 100 %.    Vent Mode: PSV;CPAP FiO2 (%):  [30 %] 30 % Set Rate:  [20 bmp] 20 bmp Vt Set:  [450 mL] 450 mL PEEP:  [5 cmH20] 5 cmH20 Pressure Support:  [12 cmH20] 12 cmH20 Plateau Pressure:  [16 cmH20-19 cmH20] 16 cmH20   Intake/Output Summary (Last 24 hours) at 07/17/2022 0815 Last data filed at 07/17/2022 0700 Gross per 24 hour  Intake 3647.76 ml  Output 2075 ml  Net 1572.76 ml    Filed Weights   07/17/22 0300  Weight: 76.3 kg    Physical Examination: General: Acute-on-chronically ill-appearing middle-aged woman in NAD. Intubated, sedated.  Cachexia.  HEENT: [place]/AT, anicteric sclera, PERRL 3.553m+ hippus, moist mucous membranes. ETT/OGT in place. LTM EEG leads in place. Neuro: Sedated. No response to verbal commands. Localizes to pain in all 4 extremities.  +Corneal, +Cough, and +Gag  CV: RRR, no m/g/r. PULM: Breathing even and unlabored on vent (PEEP 5, FiO2 30%). Lung fields are now clear.  GI: Soft,  nontender, nondistended. Normoactive bowel sounds. Extremities: No LE edema noted. Skin: Warm/dry, no rashes.  Continuous EEG shows generalized slowing without clear seizures, but no periods of suppression.  Ancillary tests personally reviewed:  Hyperglycemia has corrected Creatinine has normalized at 1.02 WBC: Down  to 12.2 (likely stress demargination and steroid effect) Mild transaminitis CXR clear.  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 -Stop propofol today - Continue Keppra and Vimpat.   Acute hypoxemic respiratory failure (HCC) Due to inability to protect airway  -Tolerating weaning today - Mental status will dictate timing of extubation: We will stop propofol and allow patient to wake up.  Given her periods of agitation may need a transition to Precedex.  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   -Appreciate Dr. Antonieta Pert input.  SCLC (small cell lung carcinoma) (HCC) Diagnosed 8 months ago at stage IV- Limited pathway forward. Needs to recover from status epilepticus rapidly for her to have a realistic chance to get back to treatment .    Best Practice: (right click and "Reselect all SmartList Selections" daily)   Diet/type: NPO DVT prophylaxis: prophylactic heparin  GI prophylaxis: PPI Lines: [place], R upper chest Foley:  Yes, and it is still needed Code Status:  full code Last date of multidisciplinary goals of care discussion [family updated 10/31: They understand the seriousness of her condition and that recovery and further treatment for cancer will only be possible if she responds promptly to treatment for status epilepticus.]  CRITICAL CARE Performed by: Kipp Brood   Total critical care time: 40 minutes  Critical care time was exclusive of separately billable procedures and treating other patients.  Critical care was necessary to treat or prevent imminent or  life-threatening deterioration.  Critical care was time spent personally by me on the following activities: development of treatment plan with patient and/or surrogate as well as nursing, discussions with consultants, evaluation of patient's response to treatment, examination of patient, obtaining history from patient or surrogate, ordering and performing treatments and interventions, ordering and review of laboratory studies, ordering and review of radiographic studies, pulse oximetry, re-evaluation of patient's condition and participation in multidisciplinary rounds.  Kipp Brood, MD [facility] ICU Physician [place]  Pager: [phone] Mobile: [phone] After hours: [phone].

## 2022-07-17 NOTE — Progress Notes (Signed)
56mLs of versed were wasted with this RN and Buck Mam in the medroom.

## 2022-07-17 NOTE — Progress Notes (Signed)
Subjective: No definite seizures overnight.  ROS: Unable to obtain due to poor mental status  Examination  Vital signs in last 24 hours: Temp:  [97.6 F (36.4 C)-98.6 F (37 C)] 98.1 F (36.7 C) (11/01 1159) Pulse Rate:  [58-110] 65 (11/01 1200) Resp:  [13-20] 20 (11/01 1200) BP: (100-162)/(51-87) 108/57 (11/01 1200) SpO2:  [93 %-100 %] 98 % (11/01 1200) FiO2 (%):  [30 %] 30 % (11/01 1102) Weight:  [76.3 kg] 76.3 kg (11/01 0300)  General: lying in bed, intubated Neuro: Opens eyes to repeated tactile stimuli and looked at examiner, attempted to follow 1 command (wiggling toes) but not consistently, PERRLA, no forced gaze deviation, withdraws to noxious stimuli in all 4 extremities with antigravity strength  Basic Metabolic Panel: Recent Labs  Lab 07/16/22 0106 07/16/22 0706 07/17/22 0601  NA 126* 139 138  K 6.2* 4.1 3.3*  CL 89* 95* 102  CO2 24 24 24   GLUCOSE 836* 295* 148*  BUN 49* 37* 24*  CREATININE 1.49* 1.33* 1.02*  CALCIUM 9.8 9.7 8.5*  MG 1.9 1.6* 1.8  PHOS  --  4.5 3.8    CBC: Recent Labs  Lab 07/16/22 0106 07/16/22 0706 07/17/22 0601  WBC 11.6* 22.5* 12.2*  NEUTROABS 9.9*  --   --   HGB 10.9* 11.1* 9.4*  HCT 33.4* 32.9* 28.4*  MCV 98.8 97.3 99.0  PLT 153 182 122*     Coagulation Studies: Recent Labs    07/16/22 0105  LABPROT 14.9  INR 1.2    Imaging CT head without contrast 07/16/2022: Multiple hyperdense/hemorrhagic metastases, not as well visualized as on prior/recent MRI. The largest lesion in the posterior right occipital lobe 2.6 cm, unchanged. Areas of edema within the right frontal and parietal/occipital lobes.    MRI brain with and without contrast 07/09/2022: Numerous intracranial metastases, as described and new from the prior brain MRI of 04/04/2022. The largest lesions are located along the right frontal operculum (17 x 12 mm), within the right occipital lobe (26 x 19 mm) and along the medial left temporal lobe (20 x 16 mm). Edema  associated with several lesions. Most notably, there is mild-to-moderate edema associated with the lesion along the right frontal operculum and moderate edema surrounding the lesion withinthe right occipital lobe. Additionally, there are non-acute blood products associated with several lesions. 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 Consider a total spine MRI with contrast for further evaluation. Mild chronic small vessel ischemic changes within the cerebral white matter. Left maxillary sinusitis.    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  Will consider discontinuing if no seizures overnight -Appreciate oncology teams assistance -Continue seizure precautions -As needed IV Ativan 2 mg for clinical seizure-like activity  I have spent a total of  36  minutes with the patient reviewing hospital notes,  test results, labs and examining the patient as well as establishing an assessment and plan.  >  50% of time was spent in direct patient care.    Zeb Comfort Epilepsy [facility] For questions after 5pm please refer to AMION to reach the Neurologist on call

## 2022-07-18 ENCOUNTER — Ambulatory Visit: Payer: BC Managed Care – PPO

## 2022-07-18 DIAGNOSIS — G40901 Epilepsy, unspecified, not intractable, with status epilepticus: Secondary | ICD-10-CM | POA: Diagnosis not present

## 2022-07-18 LAB — BASIC METABOLIC PANEL
Anion gap: 9 (ref 5–15)
BUN: 14 mg/dL (ref 8–23)
CO2: 23 mmol/L (ref 22–32)
Calcium: 8.5 mg/dL — ABNORMAL LOW (ref 8.9–10.3)
Chloride: 105 mmol/L (ref 98–111)
Creatinine, Ser: 0.85 mg/dL (ref 0.44–1.00)
GFR, Estimated: >60 mL/min (ref >60)
Glucose, Bld: 222 mg/dL — ABNORMAL HIGH (ref 70–99)
Potassium: 4.1 mmol/L (ref 3.5–5.1)
Sodium: 137 mmol/L (ref 135–145)

## 2022-07-18 LAB — GLUCOSE, CAPILLARY
Glucose-Capillary: 203 mg/dL — ABNORMAL HIGH (ref 70–99)
Glucose-Capillary: 215 mg/dL — ABNORMAL HIGH (ref 70–99)
Glucose-Capillary: 100 mg/dL — ABNORMAL HIGH (ref 70–99)
Glucose-Capillary: 198 mg/dL — ABNORMAL HIGH (ref 70–99)
Glucose-Capillary: 281 mg/dL — ABNORMAL HIGH (ref 70–99)
Glucose-Capillary: 115 mg/dL — ABNORMAL HIGH (ref 70–99)

## 2022-07-18 MED ORDER — LACOSAMIDE 50 MG PO TABS
200.0000 mg | ORAL_TABLET | Freq: Two times a day (BID) | ORAL | Status: DC
  Administered 2022-07-18: 200 mg via ORAL
  Filled 2022-07-18: qty 4

## 2022-07-18 MED ORDER — PANTOPRAZOLE SODIUM 40 MG PO TBEC
40.0000 mg | DELAYED_RELEASE_TABLET | Freq: Every day | ORAL | Status: DC
  Administered 2022-07-18: 40 mg via ORAL
  Filled 2022-07-18: qty 1

## 2022-07-18 MED ORDER — INSULIN DETEMIR 100 UNIT/ML ~~LOC~~ SOLN
9.0000 [IU] | Freq: Two times a day (BID) | SUBCUTANEOUS | Status: DC
  Administered 2022-07-18 – 2022-07-19 (×4): 9 [IU] via SUBCUTANEOUS
  Filled 2022-07-18 (×6): qty 0.09

## 2022-07-18 MED ORDER — LEVETIRACETAM 500 MG PO TABS
1000.0000 mg | ORAL_TABLET | Freq: Two times a day (BID) | ORAL | Status: DC
  Administered 2022-07-18: 1000 mg via ORAL
  Filled 2022-07-18: qty 2

## 2022-07-18 MED ORDER — ORAL CARE MOUTH RINSE: 15.0000 mL | OROMUCOSAL | Status: DC | PRN

## 2022-07-18 MED ORDER — POLYETHYLENE GLYCOL 3350 17 G PO PACK
17.0000 g | PACK | Freq: Every day | ORAL | Status: DC
  Administered 2022-07-27: 17 g via ORAL
  Filled 2022-07-18 (×7): qty 1

## 2022-07-18 MED ORDER — LEVOTHYROXINE SODIUM 25 MCG PO TABS
125.0000 ug | ORAL_TABLET | Freq: Every day | ORAL | Status: DC
  Administered 2022-07-18 – 2022-08-02 (×16): 125 ug via ORAL
  Filled 2022-07-18 (×16): qty 1

## 2022-07-18 MED ORDER — PHENOL 1.4 % MT LIQD
1.0000 | OROMUCOSAL | Status: DC | PRN
  Administered 2022-07-18: 1 via OROMUCOSAL
  Filled 2022-07-18: qty 177

## 2022-07-18 MED ORDER — DOCUSATE SODIUM 100 MG PO CAPS
100.0000 mg | ORAL_CAPSULE | Freq: Two times a day (BID) | ORAL | Status: DC
  Administered 2022-07-18 – 2022-07-29 (×13): 100 mg via ORAL
  Filled 2022-07-18 (×22): qty 1

## 2022-07-18 NOTE — Progress Notes (Signed)
RT placed pt in CPAP/PS 5/5. Pt tolerating well at this time. RT will continue to monitor pt.

## 2022-07-18 NOTE — Progress Notes (Addendum)
Pamela [name] is a little more alert.  I think she might be off the propofol now.  She opened her eyes when I spoke to her.  I really do not think she knew who I was.  She kept looking off to the distance.  She still being monitored for seizure activity.  Hopefully, there is been no further seizure activity.  I am not sure when she can be tried to be weaned off the ventilator.  There is really no lab work back yet today.  I be met did come back which showed a glucose of 222.  I am sure this is from the steroids she is taking.  At some point, she is going to have to be fed.  As far as any, therapy for the small cell lung cancer, I think it really will come down to her being able to come off the ventilator and being able to understand and tolerate radiation therapy for the CNS metastasis.  Probably would not be a bad idea to get Radiation Oncology involved now so they can assess her and see with a would feel would be appropriate for her.  We really need to get some additional lab work on her.  We will see what this looks like tomorrow.  Her vital signs show temperature of 97.2.  Pulse 48.  Blood pressure 98/56.  Her lungs are relatively clear bilaterally.  She has good air movement bilaterally.  Cardiac exam is regular rate and rhythm.  No new murmurs.  Abdomen is soft.  There may be a little bit of distention.  I do not detect any obvious fluid wave.  Her liver edge might be palpable at the right costal margin.  Extremity shows some chronic mild edema in the lower legs.  Neurological exam shows no obvious focal deficits.  Pamela [name] has progressive small cell lung cancer.  She has CNS metastasis.  She has seizures.  She is on I think Keppra and Vimpat right now for this.  Hopefully, she will do the weaned off the ventilator.  I still do not see any role for systemic therapy on her.  I think any focus of therapy needs to involve the brain.  She does have a Port-A-Cath.  This really needs to be  accessed.  This will make life a lot easier for everybody in my opinion.  I know that she is getting fantastic care from everybody up in the Neuro ICU.  I am very impressed with the compassion and the complexity of care that she is getting.   Kerby Nora, MD  [place] 10:27

## 2022-07-18 NOTE — Plan of Care (Signed)
Reported by [facility] monitor about change in EEG around 4:20 AM.  No seizures on preliminary review.  Continue to monitor.  -- Amie Portland, MD Neurologist [facility] Pager: [phone]

## 2022-07-18 NOTE — Progress Notes (Signed)
Subjective: No acute events overnight.  Was extubated this morning.  Daughter and grandson's fianc at bedside.  States her throat is sore.  Denies any other concerns.  ROS: negative except above  Examination  Vital signs in last 24 hours: Temp:  [97.2 F (36.2 C)-98.4 F (36.9 C)] 97.4 F (36.3 C) (11/02 1200) Pulse Rate:  [48-99] 67 (11/02 1100) Resp:  [11-20] 14 (11/02 1100) BP: (91-142)/(51-73) 142/70 (11/02 1100) SpO2:  [91 %-100 %] 100 % (11/02 1100) FiO2 (%):  [30 %] 30 % (11/02 0751)  General: Sitting in chair, not in apparent distress  Neuro: MS: Alert, oriented, follows commands CN: pupils equal and reactive,  EOMI, face symmetric, tongue midline, normal sensation over face Motor: 4/5 strength in all 4 extremities Sensory: Equal sensation to light touch, no neglect   Basic Metabolic Panel: Recent Labs  Lab 07/16/22 0106 07/16/22 0706 07/17/22 0601 07/18/22 0423  NA 126* 139 138 137  K 6.2* 4.1 3.3* 4.1  CL 89* 95* 102 105  CO2 24 24 24 23   GLUCOSE 836* 295* 148* 222*  BUN 49* 37* 24* 14  CREATININE 1.49* 1.33* 1.02* 0.85  CALCIUM 9.8 9.7 8.5* 8.5*  MG 1.9 1.6* 1.8  --   PHOS  --  4.5 3.8  --     CBC: Recent Labs  Lab 07/16/22 0106 07/16/22 0706 07/17/22 0601  WBC 11.6* 22.5* 12.2*  NEUTROABS 9.9*  --   --   HGB 10.9* 11.1* 9.4*  HCT 33.4* 32.9* 28.4*  MCV 98.8 97.3 99.0  PLT 153 182 122*     Coagulation Studies: Recent Labs    07/16/22 0105  LABPROT 14.9  INR 1.2    Imaging No new brain imaging overnight   ASSESSMENT AND PLAN:  61 year old female with 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  -If this is not approved by insurance, can prescribe clonazepam oral dissolving tablets 2 mg for lasting more than 15 minutes -Discontinue LTM EEG -Continue seizure precautions -As needed IV Ativan 2 mg for clinical seizure-like activity -Follow-up with neurology in 2 to 3 months if needed for seizure management  Seizure precautions: Per [facility] statutes, patients with seizures are not allowed to drive until they have been seizure-free for six months and cleared by a physician    Use caution when using heavy equipment or power tools. Avoid working on ladders or at heights. Take showers instead of baths. Ensure the water temperature is not too high on the home water heater. Do not go swimming alone. Do not lock yourself in a room alone (i.e. bathroom). When caring for infants or small children, sit down when holding, feeding, or changing them to minimize risk of injury to the child in the event you have a seizure. Maintain good sleep hygiene. Avoid alcohol.    If patient has another seizure, call 911 and bring them back to the ED if: A.  The seizure lasts longer than 5 minutes.      B.  The patient doesn't wake shortly after the seizure or has new problems such as difficulty seeing, speaking or moving following the seizure C.  The patient was injured during the seizure D.  The patient has a temperature over 102 F (39C) E.  The patient vomited during the seizure and now is having trouble breathing    During the Seizure   - First, ensure adequate ventilation and place patients on the floor on their left side  Loosen clothing around the neck and ensure the airway is patent. If the patient is clenching the teeth, do not force the mouth open with any object as this can cause severe  damage - Remove all items from the surrounding that can be hazardous. The patient may be oblivious to what's happening and may not even know what he or she is doing. If the patient is confused and wandering, either gently guide him/her away and block access to outside areas - Reassure the individual and be comforting - Call 911. In most cases, the seizure ends before EMS arrives. However, there are cases when seizures may last over 3 to 5 minutes. Or the individual may have developed breathing difficulties or severe injuries. If a pregnant patient or a person with diabetes develops a seizure, it is prudent to call an ambulance. - Finally, if the patient does not regain full consciousness, then call EMS. Most patients will remain confused for about 45 to 90 minutes after a seizure, so you must use judgment in calling for help.    After the Seizure (Postictal Stage)   After a seizure, most patients experience confusion, fatigue, muscle pain and/or a headache. Thus, one should permit the individual to sleep. For the next few days, reassurance is essential. Being calm and helping reorient the person is also of importance.   Most seizures are painless and end spontaneously. Seizures are not harmful to others but can lead to complications such as stress on the lungs, brain and the heart. Individuals with prior lung problems may develop labored breathing and respiratory distress.     I have spent a total of  36  minutes with the patient reviewing hospital notes,  test results, labs and examining the patient as well as establishing an assessment and plan.  > 50% of time was spent in direct patient care.    Zeb Comfort Epilepsy [facility] For questions after 5pm please refer to AMION to reach the Neurologist on call

## 2022-07-18 NOTE — Progress Notes (Signed)
EEG LTM D/C'd. No skin break down noted. [facility] notified.

## 2022-07-18 NOTE — Procedures (Addendum)
Patient Name: Pamela [name]  MRN: [id number]  Epilepsy Attending: Lora Havens  Referring Physician/Provider: Amie Portland, MD  Duration: 11/3//2023 1116 to 07/20/2022 1116   Patient history: 61 year old female with non-small cell lung cancer with brain mets presented with seizures. EEG to eval for status epilepticus   Level of alertness: lethargic, asleep   AEDs during EEG study:  LEV, LCM   Technical aspects: This EEG study was done with scalp electrodes positioned according to the 10-20 International system of electrode placement. Electrical activity was reviewed with band pass filter of 1-70Hz , sensitivity of 7 uV/mm, display speed of 41mm/sec with a 60Hz  notched filter applied as appropriate. EEG data were recorded continuously and digitally stored.  Video monitoring was available and reviewed as appropriate.   Description: At the beginning of the study, patient was noted to have right upper extremity twitching and right gaze deviation. Concomitant eeg showed rhythmic 2-3hz  delta slowing in left hemisphere admixed with sharp waves in left parietal region. Seizures  abated at around noon. This was consistent with focal motor/convulsive status epilepticus arising from left parietal region. Subsequently EEG showed continuous generalized 3-6hz  theta-delta slowing admixed with 12-14Hz  beta activity. Abundant sharp waves were also noted in right temporo-parietal region, which at times appeared periodic at 1Hz .   Gradually, EEG continued to improve and showed posterior dominant rhythm of 7.5 Hz activity of moderate voltage (25-35 uV) seen predominantly in posterior head regions, symmetric and reactive to eye opening and eye closing. Sleep was characterized by vertex waves, sleep spindles (12 to 14 Hz), maximal frontocentral region. Intermittent generalized 3-6Hz  theta-delta slowing was also noted.   One seizures without clinical signs was noted on 07/19/2022 at 2027. EEG showed sharply contoured  6-7zhz theta slowing admixed with sharp wave i  right temporo-parietal region which then evolved in to 2-3hz  delta slowing and involved all of right hemisphere. Seizure lasted for about 1 minute 10 seconds.  Event button was pressed on 07/20/2022 at 0558. Patient was noted to have right upper extremity jerking at 0556. Concomitant eeg didn't show any eeg change However, the semiology of the episode was concerning for focal motor seizures which may not be seen on scalp eegs.   Hyperventilation and photic stimulation were not performed.      ABNORMALITY - Focal convulsive status epilepticus, left parietal region.  - Seizure, right temporo-parietal region - Sharp wave, right temporoparietal region - Intermittent slow, generalized   IMPRESSION: At the beginning of study, patient was in focal convulsive status epilepticus arising from left parietal region. Status epilepticus resolved at around noon.   One seizure without clinical signs was noted on 07/19/2022 at 2027 arising from right temporo-parietal region, lasting about 1 minute 10 seconds.   Event button was pressed on 07/20/2022 at 0558 for right upper extremity jerking. Concomitant eeg didn't show any eeg change However, the semiology of the episode was concerning for focal motor seizures which may not be seen on scalp eegs.   Additionally, study showed evidence of independent epileptogenicity arising from left parietal and right temporoparietal region likely secondary to underlying structural abnormality. Lastly there was mild to moderate diffuse encephalopathy, nonspecific etiology.   Chad Tiznado Barbra Sarks

## 2022-07-18 NOTE — Procedures (Signed)
Extubation Procedure Note  Patient Details:   Name: Pamela [name] DOB: Oct 13, 1960 MRN: [id number]   Airway Documentation:    Vent end date: 07/18/22 Vent end time: 0850   Evaluation  O2 sats: stable throughout Complications: No apparent complications Patient did tolerate procedure well. Bilateral Breath Sounds: Clear, Diminished   Yes,  Per CCM order, pt was extubated to 3L [place]. Prior to extubation pt did have a positive cuff leak. After extubation pt was able to state her name. No complications noted, no stridor noted, pt did have SVS. RN currently at bedside. RT will continue to monitor pt.  Jorje Guild 07/18/2022, 8:53 AM

## 2022-07-18 NOTE — Progress Notes (Signed)
vLTM maintenance  All impedances below 10k.  No skin breakdown noted at all skin sites

## 2022-07-19 ENCOUNTER — Inpatient Hospital Stay (HOSPITAL_COMMUNITY): Payer: BC Managed Care – PPO

## 2022-07-19 ENCOUNTER — Other Ambulatory Visit: Payer: Self-pay | Admitting: Radiation Therapy

## 2022-07-19 ENCOUNTER — Encounter: Payer: Self-pay | Admitting: *Deleted

## 2022-07-19 DIAGNOSIS — Z7189 Other specified counseling: Secondary | ICD-10-CM | POA: Diagnosis not present

## 2022-07-19 DIAGNOSIS — G40901 Epilepsy, unspecified, not intractable, with status epilepticus: Secondary | ICD-10-CM | POA: Diagnosis not present

## 2022-07-19 DIAGNOSIS — Z515 Encounter for palliative care: Secondary | ICD-10-CM | POA: Diagnosis not present

## 2022-07-19 LAB — COMPREHENSIVE METABOLIC PANEL
ALT: 111 U/L — ABNORMAL HIGH (ref 0–44)
AST: 72 U/L — ABNORMAL HIGH (ref 15–41)
Albumin: 3 g/dL — ABNORMAL LOW (ref 3.5–5.0)
Alkaline Phosphatase: 148 U/L — ABNORMAL HIGH (ref 38–126)
Anion gap: 8 (ref 5–15)
BUN: 13 mg/dL (ref 8–23)
CO2: 27 mmol/L (ref 22–32)
Calcium: 9 mg/dL (ref 8.9–10.3)
Chloride: 105 mmol/L (ref 98–111)
Creatinine, Ser: 1 mg/dL (ref 0.44–1.00)
GFR, Estimated: >60 mL/min (ref >60)
Glucose, Bld: 109 mg/dL — ABNORMAL HIGH (ref 70–99)
Potassium: 4 mmol/L (ref 3.5–5.1)
Sodium: 140 mmol/L (ref 135–145)
Total Bilirubin: 0.7 mg/dL (ref 0.3–1.2)
Total Protein: 6.2 g/dL — ABNORMAL LOW (ref 6.5–8.1)

## 2022-07-19 LAB — CBC WITH DIFFERENTIAL/PLATELET
Abs Immature Granulocytes: 0.25 10*3/uL — ABNORMAL HIGH (ref 0.00–0.07)
Basophils Absolute: 0 10*3/uL (ref 0.0–0.1)
Basophils Relative: 0 %
Eosinophils Absolute: 0 10*3/uL (ref 0.0–0.5)
Eosinophils Relative: 0 %
HCT: 31.6 % — ABNORMAL LOW (ref 36.0–46.0)
Hemoglobin: 10.2 g/dL — ABNORMAL LOW (ref 12.0–15.0)
Immature Granulocytes: 2 %
Lymphocytes Relative: 5 %
Lymphs Abs: 0.5 10*3/uL — ABNORMAL LOW (ref 0.7–4.0)
MCH: 32.4 pg (ref 26.0–34.0)
MCHC: 32.3 g/dL (ref 30.0–36.0)
MCV: 100.3 fL — ABNORMAL HIGH (ref 80.0–100.0)
Monocytes Absolute: 1 10*3/uL (ref 0.1–1.0)
Monocytes Relative: 9 %
Neutro Abs: 9.2 10*3/uL — ABNORMAL HIGH (ref 1.7–7.7)
Neutrophils Relative %: 84 %
Platelets: 101 10*3/uL — ABNORMAL LOW (ref 150–400)
RBC: 3.15 MIL/uL — ABNORMAL LOW (ref 3.87–5.11)
RDW: 16 % — ABNORMAL HIGH (ref 11.5–15.5)
WBC: 11.1 10*3/uL — ABNORMAL HIGH (ref 4.0–10.5)
nRBC: 0 % (ref 0.0–0.2)

## 2022-07-19 LAB — GLUCOSE, CAPILLARY
Glucose-Capillary: 103 mg/dL — ABNORMAL HIGH (ref 70–99)
Glucose-Capillary: 190 mg/dL — ABNORMAL HIGH (ref 70–99)
Glucose-Capillary: 152 mg/dL — ABNORMAL HIGH (ref 70–99)
Glucose-Capillary: 88 mg/dL (ref 70–99)
Glucose-Capillary: 107 mg/dL — ABNORMAL HIGH (ref 70–99)
Glucose-Capillary: 128 mg/dL — ABNORMAL HIGH (ref 70–99)

## 2022-07-19 LAB — LACTATE DEHYDROGENASE: LDH: 261 U/L — ABNORMAL HIGH (ref 98–192)

## 2022-07-19 LAB — PREALBUMIN: Prealbumin: 24 mg/dL (ref 18–38)

## 2022-07-19 MED ORDER — LEVETIRACETAM IN NACL 1000 MG/100ML IV SOLN
1000.0000 mg | INTRAVENOUS | Status: AC
  Administered 2022-07-19: 1000 mg via INTRAVENOUS
  Filled 2022-07-19: qty 100

## 2022-07-19 MED ORDER — SODIUM CHLORIDE 0.9 % IV SOLN
2000.0000 mg | Freq: Two times a day (BID) | INTRAVENOUS | Status: DC
  Administered 2022-07-19 – 2022-07-23 (×8): 2000 mg via INTRAVENOUS
  Filled 2022-07-19 (×10): qty 20

## 2022-07-19 MED ORDER — LEVETIRACETAM IN NACL 1000 MG/100ML IV SOLN
1000.0000 mg | Freq: Two times a day (BID) | INTRAVENOUS | Status: DC
  Administered 2022-07-19: 1000 mg via INTRAVENOUS
  Filled 2022-07-19: qty 100

## 2022-07-19 MED ORDER — LORAZEPAM 2 MG/ML IJ SOLN
2.0000 mg | INTRAMUSCULAR | Status: AC
  Administered 2022-07-19: 2 mg via INTRAVENOUS

## 2022-07-19 MED ORDER — SODIUM CHLORIDE 0.9 % IV SOLN
200.0000 mg | Freq: Two times a day (BID) | INTRAVENOUS | Status: AC
  Administered 2022-07-19 – 2022-07-24 (×11): 200 mg via INTRAVENOUS
  Filled 2022-07-19 (×13): qty 20

## 2022-07-19 MED ORDER — ACETAMINOPHEN 325 MG PO TABS
650.0000 mg | ORAL_TABLET | ORAL | Status: DC | PRN
  Administered 2022-07-19 – 2022-07-29 (×7): 650 mg via ORAL
  Filled 2022-07-19 (×7): qty 2

## 2022-07-19 MED ORDER — PANTOPRAZOLE SODIUM 40 MG IV SOLR
40.0000 mg | Freq: Every day | INTRAVENOUS | Status: DC
  Administered 2022-07-19: 40 mg via INTRAVENOUS
  Filled 2022-07-19: qty 10

## 2022-07-19 MED FILL — Fentanyl Citrate Preservative Free (PF) Inj 100 MCG/2ML: INTRAMUSCULAR | Qty: 2 | Status: AC

## 2022-07-19 NOTE — Consult Note (Signed)
Palliative Medicine Inpatient Consult Note  Consulting Provider:  Maryjane Hurter, MD    Reason for consult:   Palliative Care Consult Services Palliative Medicine Consult   Symptom Management Consult  Reason for Consult? extensive stage small cell with brain mets, ongoing seizure activity hopeful to get to radiation at [facility] long but unsure if realistic   07/19/2022  HPI:  Per intake H&P --> 61 year old woman PMHx significant for HTN, T2DM, hypothyroidism, SCLC (s/p chemotherapy, followed by Dr. Marin Olp) with known liver metastasis and recently diagnosed brain metastasis with plan for whole brain radiation (not yet started). Found to be in status epilepticus (cerebral metastases from [facility] ). Palliative care has been asked to get involved for further [place] conversation in the setting of ongoing seizure activirty.  Clinical Assessment/Goals of Care:  *Please note that this is a verbal dictation therefore any spelling or grammatical errors are due to the "[place]" system interpretation.  I have reviewed medical records including EPIC notes, labs and imaging, received report from bedside RN, assessed the patient.    I met with Pamela [name] and her daughter, Edgar Frisk to further discuss diagnosis prognosis, GOC, EOL wishes, disposition and options.   I introduced Palliative Medicine as specialized medical care for people living with serious illness. It focuses on providing relief from the symptoms and stress of a serious illness. The goal is to improve quality of life for both the patient and the family.  Medical History Review and Understanding:  Discussed Pamela [name]'s history of T2DM, SCLC for which treatment was initiated in February. She now has mets to her brain identified w.in the last few months.   Social History:  [name] is from [place], [place]. He is married to her spouse, [name] and they share two children and nine grandchildren. She was a housewife as of  the recent years. She shares that her great joy in life is spending time with her grandchildren. She is a woman of faith and practices within the [facility] denomination.   Functional and Nutritional State:  Prior to this admission she was fully functional of all bADLs and iADLs. We discussed that she was mobilizing without aid and able to drive still. Her appetite had been fair.   Advance Directives:  A detailed discussion was had today regarding advanced directives.  Patient had completed these in the past and her daughter is requesting that a copy is brought in from home.   Code Status:  Concepts specific to code status, artifical feeding and hydration, continued IV antibiotics and rehospitalization was had. The difference between a aggressive medical intervention path  and a palliative comfort care path for this patient at this time was had.   At this time patients daughter shares that per the conversation as a family this morning the decision had been made for re-intubation if needed. I shared that it is important as a family to sit down and discuss the goals in the setting of patients multiple disease burdens.   Discussion:  I spoke with [name] who expresses that she has knowledge of trying to transition to [facility] for radiation. We discussed that as of this morning things were seeming to head in a positive direction until she suffered another seizure.   Discussed the concern that these events are becoming cyclic and you fix one problem only to wind up with another one. We reviewed that this can continue moving forward with or without radiation. Patients daughter, Anderson Malta recognizes how  tenuos her mothers state  is at this juncture.  Goals are to stabilize Pamela [name] to get to [facility] for radiation treatments.   Reviewed the patients respect for Dr. Marin Olp, discussed that if a treatment option did not seem feasible he would discuss it with them.   Discussed the importance of continued conversation  with family and their  medical providers regarding overall plan of care and treatment options, ensuring decisions are within the context of the patients values and GOCs.  Decision Maker: Verdejo,ALVIN (Spouse): [phone] (Home Phone)   [place] / Full scope of care at this time --> If re-intubation is needed patient and family would like to pursue this  Goal is for Tamanika to get to [facility] for brain radiation  Ongoing PMT support  Code Status/Advance Care Planning: FULL CODE  Palliative Prophylaxis:  Aspiration, Bowel Regimen, Delirium Protocol, Frequent Pain Assessment, Oral Care, Palliative Wound Care, and Turn Reposition  Additional Recommendations (Limitations, Scope, Preferences): Avoid Hospitalization, Full Scope Treatment, No Artificial Feeding, No Surgical Procedures, and No Tracheostomy  Psycho-social/Spiritual:  Desire for further Chaplaincy support: Not at the present time.  Additional Recommendations: Discussion of metastatic disease,.   Prognosis: Poor overall, as of presently Pamela [name] would qualify for hospice care.  Discharge Planning: Discharge uncertain at this time.   Vitals:   07/19/22 1200 07/19/22 1300  BP: (!) 169/77 (!) 151/70  Pulse: 95 84  Resp: (!) 23 18  Temp: 98.9 F (37.2 C)   SpO2: 98% 100%    Intake/Output Summary (Last 24 hours) at 07/19/2022 1441 Last data filed at 07/19/2022 1300 Gross per 24 hour  Intake 724.83 ml  Output 650 ml  Net 74.83 ml   Last Weight  Most recent update: 07/17/2022  3:41 AM    Weight  76.3 kg (168 lb 3.4 oz)            Gen: Older caucasian F - chronically ill appearing HEENT: dry mucous membranes CV: Regular rate and rhythm  PULM: ON RA, breathing is even and nonlabored ABD: soft/nontender  EXT: Mild BLE edema  Neuro: Alert and oriented x2-3 --> slow responses and at times fragments of words  PPS: 20%   This conversation/these recommendations were discussed with patient primary  care team, Dr. Verlee Monte  Billing based on MDM: High  Problems Addressed: One acute or chronic illness or injury that poses a threat to life or bodily function  Amount and/or Complexity of Data: Category 3:Discussion of management or test interpretation with external physician/other qualified health care professional/appropriate source (not separately reported)  Risks: Decision regarding hospitalization or escalation of hospital care and Decision not to resuscitate or to de-escalate care because of poor prognosis ______________________________________________________ [place] Team Team Cell Phone: [phone] Please utilize secure chat with additional questions, if there is no response within 30 minutes please call the above phone number  Palliative Medicine Team providers are available by phone from 7am to 7pm daily and can be reached through the team cell phone.  Should this patient require assistance outside of these hours, please call the patient's attending physician.

## 2022-07-19 NOTE — Progress Notes (Signed)
I am absolutely impressed as to how well Pamela [name] looks this morning.  She was extubated yesterday.  She is alert.  She is pretty much oriented.  She really does not remember much about what happened to her.  Again, she is doing much better than I would have thought neurologically.  Because of this, I think that we have to try to proceed with cranial radiation.  I think we have to try to get this going quickly.  I will have to believe that it would be best if she could be moved over to [facility] and try to get radiation that started on Monday.  I we will have to speak with radiation oncology about this.  She probably needs a lot of physical therapy.  I am sure that this will be ordered for her.  Her labs show a sodium 140.  Potassium 4.0.  BUN 13 creatinine 1.0.  Her blood sugar is only 109.  Her LFTs are little bit elevated.  Her prealbumin is wonderful at 8.  Her total bilirubin is 0.7.  White cell count 11.1.  Hemoglobin 10.2.  Her platelet count is 101,000.  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  Again I really think that we need to get radiation therapy started quickly.  As far as systemic therapy is concerned, this might be a little bit more "dicey."  It will depend on what kind of performance status she has after the radiation.  I think that the radiation that should work for the cranial metastasis given that she has relatively small tumors in the brain.  I am just happy that she has made such a nice recovery so far.  I know this is a big part of the great care that she had from everybody up on the Neurosurgery ICU.  Her vital signs are temperature of 98.3.  Pulse 81.  Blood pressure 166/79.  Her lungs sound clear bilaterally.  She has good air movement bilaterally.  Cardiac exam regular rate and rhythm.  Abdomen is soft.  Bowel sounds are present.  She has no fluid wave.   There is no palpable liver or spleen tip.  Liver edge may be at the right costal margin.  Extremities shows some mild edema in her lower legs.  Neurological exam is relatively intact.  Again, [name] has been progressive small cell lung cancer.  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  She needs to have radiation therapy for this.  Again, it would be nice to move her over to [facility] to start radiation therapy.  She probably needs to be monitored initially while she is getting radiation therapy.  I do appreciate the incredible care that she has had from everybody over on 4N.    [name], MD  Darlyn Chamber 17:14

## 2022-07-19 NOTE — Progress Notes (Signed)
eLink Physician-Brief Progress Note Patient Name: Pamela [name] DOB: 1961/02/20 MRN: [id number]   Date of Service  07/19/2022  HPI/Events of Note  Pt extubated today & having ongoing throat pain & is hoarse. Throat spray not relieving pain   eICU Interventions  Tylenol prn ordered     Intervention Category Intermediate Interventions: Pain - evaluation and management  Judd Lien 07/19/2022, 12:06 AM

## 2022-07-19 NOTE — Progress Notes (Signed)
Patient continues to be hospitalized, however she is now extubated and alert. Dr Marin Olp will try to coordinate transfer to [facility] over the weekend in an attempt to start radiation on Monday.   Will continue to follow for post discharge needs and office follow up.   Oncology Nurse Navigator Documentation     07/19/2022    8:00 AM  Oncology Nurse Navigator Flowsheets  Navigator Follow Up Date: 07/26/2022  Navigator Follow Up Reason: Appointment Review  Navigator Location [facility]  Navigator Encounter Type Appt/Treatment Plan Review  Patient Visit Type MedOnc  Treatment Phase Active Tx  Barriers/Navigation Needs Coordination of Care;Education  Interventions None Required  Acuity Level 2-Minimal Needs (1-2 Barriers Identified)  Support Groups/Services Friends and Family  Time Spent with Patient 15

## 2022-07-19 NOTE — Progress Notes (Signed)
This RN noticed seizure like activity in patient upon entering patients room at 0823 (RUE rhythmic motion, decreased alertness, inability to follow commands or respond to questions, R gaze preference). PRN ativan administered.  Notified Neurologist on call via page Cheral Marker MD. Advised to notify Hortense Ramal MD as neurology had signed off, [phone]. Netty Starring MD, via page. Yadav at bedside, orders received.  Dorma Russell, RN

## 2022-07-19 NOTE — Progress Notes (Signed)
NAME:  Pamela [name], MRN:  [id number], DOB:  1961-08-10, LOS: 3 ADMISSION DATE:  07/16/2022 CONSULTATION DATE:  07/16/2022 REFERRING MD:  [facility] CHIEF COMPLAINT:  Seizures   History of Present Illness:  61 year old woman who presented to [facility] ED 10/30 via EMS after being found down at home with seizure-like activity. LKW 2040, found around 2245 having L-sided partial seizures and hyperglycemic to 300s. Recent transient spontaenously-resolving episodes of slurred speech at home per family. PMHx significant for HTN, T2DM, hypothyroidism, SCLC (s/p chemotherapy, followed by Dr. Marin Olp) with known liver metastasis and recently diagnosed brain metastasis with plan for whole brain radiation (not yet started).   On EMS arrival, patient was reportedly mostly unresponsive with ongoing L-sided partial seizure activity. Versed IV (85m total) administered without cessation of seizure. Labs were notable for WBC 11.6, H&H/Plt WNL, hyponatremia with Na 126, K 6.2, serum glucose 836, BUN 49, Cr 1.49 (baseline 0.9). LFTs elevated with AST/ALT 76/110, Alk Phos 226. INR WNL. UA with > 500 glucose; UDS +BZD (s/p Ativan/Versed). Ethanol negative. [facility]; Propofol gtt + Versed initiated. Neuro consulted; rapid EEG completed. Keppra load given + fosphenytoin. CT Head with multiple hyperdense/hemorrhagic metastases (largest 2.6cm, unchanged) with edema of R frontal and parietal/occipital lobes. CT C-spine negative.   [facility] P[facility]M to admit.  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 Intubated on arrival to ED for airway protection. CT Head with known hemorrhagic brain mets. Hyperglycemic to 600s. [facility]  11/1 episodes of jerking seen without EEG correlate. Extubated 11/2, LTM discontinued   Interim History / Subjective:  Has required ativan this morning for seizure activity only partially responsive to ativan, Dr. YHortense Ramalnotified  Objective:  Blood pressure (!) 170/76, pulse 92, temperature 98.1 F (36.7 C), temperature source Oral, resp. rate (!) 31, height _0  (1.575 m), weight 76.3 kg, SpO2 94 %.        Intake/Output Summary (Last 24 hours) at 07/19/2022 0951 Last data filed at 07/18/2022 2100 Gross per 24 hour  Intake 1035.22 ml  Output 1200 ml  Net -164.78 ml   Filed Weights   07/17/22 0300  Weight: 76.3 kg    Physical Examination: General appearance: 61y.o., female, unwell appearing Eyes: right gaze deviation Lungs: diminished bl, with normal respiratory effort CV: RRR, no murmur  Abdomen: Soft, non-tender; non-distended, BS present  Extremities: No peripheral edema, warm Neuro: rhythmic movement of RUE, groans with noxious stimulation  Labs/imaging reviewed Lytes ok, PLT 101   Ancillary tests personally reviewed:  Hyperglycemia has corrected Creatinine has normalized at 1.02 WBC: Down to 12.2 (likely stress demargination and steroid effect) Mild transaminitis CXR clear.  Hba1c 8.9  Resolved issues: Acute hypoxic respiratory failure - intubated in setting of status epilepticus  Assessment & Plan:   Status [place]pticus ([place] Likely secondary to [place]  again has had seizure activity today. 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 Antonieta Pert input.  SCLC (small cell lung carcinoma) (HCC) Diagnosed 8 months ago at stage IV- Limited pathway forward. Needs to recover from status epilepticus rapidly for her to have a realistic chance to get back to treatment .    Best Practice: (right click and "Reselect all SmartList Selections" daily)   Diet/type: NPO DVT prophylaxis: prophylactic heparin  GI prophylaxis: PPI Lines: Port, R upper chest Foley:  Yes, and it is still needed Code Status:  full code Last date of multidisciplinary goals of care discussion [family updated 11/3, palliative care consutled. Difficult scenario, unclear if we will pursue elective intubation for more aggressive seizure control if reloading AEDs unsuccessful]

## 2022-07-19 NOTE — Progress Notes (Signed)
PT Cancellation Note  Patient Details Name: Pamela [name] MRN: [id number] DOB: January 04, 1961   Cancelled Treatment:    Reason Eval/Treat Not Completed: Medical issues which prohibited therapy.  Pt presently seizing, medical team trying to get pt under control. 07/19/2022  Ginger Carne., PT Acute Rehabilitation Services [phone]  (office)   [name] 07/19/2022, 10:01 AM

## 2022-07-19 NOTE — Progress Notes (Signed)
Subjective: Unfortunately, seizures recurred this morning.  Nurse notified me that patient had right upper extremity twitching which did not resolve with 2 mg of IV Ativan.  Patient was then loaded with Keppra 2 g and received her 200 mg IV Vimpat after with seizures stopped.  Family including daughter, daughter-in-law and brother at bedside.  ROS: Unable to obtain due to poor mental status  Examination  Vital signs in last 24 hours: Temp:  [97.8 F (36.6 C)-98.9 F (37.2 C)] 98.9 F (37.2 C) (11/03 1200) Pulse Rate:  [75-111] 84 (11/03 1300) Resp:  [11-36] 18 (11/03 1300) BP: (151-184)/(67-87) 151/70 (11/03 1300) SpO2:  [91 %-100 %] 100 % (11/03 1300)  Neuro: Patient was awake and able to tell me her name, she had a continuous rhythmic twitching of right upper extremity consistent with focal motor seizure.  I went back to examine patient later around 11:30 AM by when patient's seizure had stopped, she was able to tell me her name and follows simple one-step commands like sticking out her tongue, she had antigravity strength in left upper and left lower extremity and was able to withdraw to noxious stimuli in right upper and right lower extremity  Basic Metabolic Panel: Recent Labs  Lab 07/16/22 0106 07/16/22 0706 07/17/22 0601 07/18/22 0423 07/19/22 0417  NA 126* 139 138 137 140  K 6.2* 4.1 3.3* 4.1 4.0  CL 89* 95* 102 105 105  CO2 24 24 24 23 27   GLUCOSE 836* 295* 148* 222* 109*  BUN 49* 37* 24* 14 13  CREATININE 1.49* 1.33* 1.02* 0.85 1.00  CALCIUM 9.8 9.7 8.5* 8.5* 9.0  MG 1.9 1.6* 1.8  --   --   PHOS  --  4.5 3.8  --   --     CBC: Recent Labs  Lab 07/16/22 0106 07/16/22 0706 07/17/22 0601 07/19/22 0417  WBC 11.6* 22.5* 12.2* 11.1*  NEUTROABS 9.9*  --   --  9.2*  HGB 10.9* 11.1* 9.4* 10.2*  HCT 33.4* 32.9* 28.4* 31.6*  MCV 98.8 97.3 99.0 100.3*  PLT 153 182 122* 101*     Coagulation Studies: No results for input(s): "LABPROT", "INR" in the last 72  hours.  Imaging CT head without contrast ordered and pending  ASSESSMENT AND PLAN: 61 year old female with 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   Convulsive status epilepticus, recurred New onset seizures Non-small cell lung cancer with metastatic disease to brain Diabetes Hyponatremia -No further seizures overnight -Status epilepticus likely due to metastatic brain disease as well as hyperglycemia   Recommendations -Will increase Keppra to 1000 mg twice daily, continue Vimpat 200 mg twice daily -If seizures recur, will load with phenytoin -Discussed with family that if she has status epilepticus again, may need to reintubate.  Family understands and is in agreement -Start long-term EEG again to monitor for seizures -Goal is to stop seizures and get patient to [facility] for radiation for the brain mets hopefully by Monday -Discussed plan with ICU team.  Dr. Marin Olp was also updated -Continue seizure precautions -As needed IV Ativan 2 mg for clinical seizure-like activity -Management of rest of the comorbidities per primary team -Discussed plan in detail with patient's family at bedside  CRITICAL CARE Performed by: Lora Havens   Total critical care time: 38 minutes  Critical care time was exclusive of separately billable procedures and treating other patients.  Critical care was necessary  to treat or prevent imminent or life-threatening deterioration.  Critical care was time spent personally by me on the following activities: development of treatment plan with patient and/or surrogate as well as nursing, discussions with consultants, evaluation of patient's response to treatment, examination of patient, obtaining history from patient or surrogate, ordering and performing treatments and interventions, ordering and review of laboratory studies, ordering and review of radiographic  studies, pulse oximetry and re-evaluation of patient's condition.    Zeb Comfort Epilepsy [facility] For questions after 5pm please refer to AMION to reach the Neurologist on call

## 2022-07-19 NOTE — Progress Notes (Signed)
LTM EEG hooked up and running - no initial skin breakdown - push button tested - [facility] monitoring.  

## 2022-07-20 DIAGNOSIS — G40901 Epilepsy, unspecified, not intractable, with status epilepticus: Secondary | ICD-10-CM | POA: Diagnosis not present

## 2022-07-20 DIAGNOSIS — Z515 Encounter for palliative care: Secondary | ICD-10-CM | POA: Diagnosis not present

## 2022-07-20 DIAGNOSIS — R569 Unspecified convulsions: Secondary | ICD-10-CM | POA: Diagnosis not present

## 2022-07-20 LAB — COMPREHENSIVE METABOLIC PANEL
ALT: 100 U/L — ABNORMAL HIGH (ref 0–44)
AST: 60 U/L — ABNORMAL HIGH (ref 15–41)
Albumin: 2.9 g/dL — ABNORMAL LOW (ref 3.5–5.0)
Alkaline Phosphatase: 176 U/L — ABNORMAL HIGH (ref 38–126)
Anion gap: 15 (ref 5–15)
BUN: 11 mg/dL (ref 8–23)
CO2: 24 mmol/L (ref 22–32)
Calcium: 9.2 mg/dL (ref 8.9–10.3)
Chloride: 101 mmol/L (ref 98–111)
Creatinine, Ser: 0.72 mg/dL (ref 0.44–1.00)
GFR, Estimated: >60 mL/min (ref >60)
Glucose, Bld: 94 mg/dL (ref 70–99)
Potassium: 3.8 mmol/L (ref 3.5–5.1)
Sodium: 140 mmol/L (ref 135–145)
Total Bilirubin: 0.8 mg/dL (ref 0.3–1.2)
Total Protein: 6.3 g/dL — ABNORMAL LOW (ref 6.5–8.1)

## 2022-07-20 LAB — CBC WITH DIFFERENTIAL/PLATELET
Abs Immature Granulocytes: 0.15 10*3/uL — ABNORMAL HIGH (ref 0.00–0.07)
Basophils Absolute: 0 10*3/uL (ref 0.0–0.1)
Basophils Relative: 0 %
Eosinophils Absolute: 0 10*3/uL (ref 0.0–0.5)
Eosinophils Relative: 0 %
HCT: 29.4 % — ABNORMAL LOW (ref 36.0–46.0)
Hemoglobin: 9.9 g/dL — ABNORMAL LOW (ref 12.0–15.0)
Immature Granulocytes: 2 %
Lymphocytes Relative: 10 %
Lymphs Abs: 1 10*3/uL (ref 0.7–4.0)
MCH: 33.1 pg (ref 26.0–34.0)
MCHC: 33.7 g/dL (ref 30.0–36.0)
MCV: 98.3 fL (ref 80.0–100.0)
Monocytes Absolute: 0.9 10*3/uL (ref 0.1–1.0)
Monocytes Relative: 10 %
Neutro Abs: 7.3 10*3/uL (ref 1.7–7.7)
Neutrophils Relative %: 78 %
Platelets: 89 10*3/uL — ABNORMAL LOW (ref 150–400)
RBC: 2.99 MIL/uL — ABNORMAL LOW (ref 3.87–5.11)
RDW: 15.8 % — ABNORMAL HIGH (ref 11.5–15.5)
WBC: 9.3 10*3/uL (ref 4.0–10.5)
nRBC: 0 % (ref 0.0–0.2)

## 2022-07-20 LAB — GLUCOSE, CAPILLARY
Glucose-Capillary: 133 mg/dL — ABNORMAL HIGH (ref 70–99)
Glucose-Capillary: 258 mg/dL — ABNORMAL HIGH (ref 70–99)
Glucose-Capillary: 286 mg/dL — ABNORMAL HIGH (ref 70–99)
Glucose-Capillary: 161 mg/dL — ABNORMAL HIGH (ref 70–99)
Glucose-Capillary: 128 mg/dL — ABNORMAL HIGH (ref 70–99)
Glucose-Capillary: 269 mg/dL — ABNORMAL HIGH (ref 70–99)

## 2022-07-20 MED ORDER — SODIUM CHLORIDE 0.9 % IV SOLN
1500.0000 mg | Freq: Once | INTRAVENOUS | Status: AC
  Administered 2022-07-20: 1500 mg via INTRAVENOUS
  Filled 2022-07-20: qty 20

## 2022-07-20 MED ORDER — INSULIN ASPART 100 UNIT/ML IJ SOLN
0.0000 [IU] | Freq: Three times a day (TID) | INTRAMUSCULAR | Status: DC
  Administered 2022-07-20: 4 [IU] via SUBCUTANEOUS
  Administered 2022-07-20: 3 [IU] via SUBCUTANEOUS
  Administered 2022-07-20: 11 [IU] via SUBCUTANEOUS
  Administered 2022-07-21: 4 [IU] via SUBCUTANEOUS
  Administered 2022-07-21: 7 [IU] via SUBCUTANEOUS
  Administered 2022-07-21: 4 [IU] via SUBCUTANEOUS
  Administered 2022-07-22 (×2): 7 [IU] via SUBCUTANEOUS
  Administered 2022-07-23: 3 [IU] via SUBCUTANEOUS
  Administered 2022-07-23 (×2): 4 [IU] via SUBCUTANEOUS
  Administered 2022-07-24: 7 [IU] via SUBCUTANEOUS
  Administered 2022-07-24: 3 [IU] via SUBCUTANEOUS
  Administered 2022-07-24: 4 [IU] via SUBCUTANEOUS
  Administered 2022-07-25: 3 [IU] via SUBCUTANEOUS
  Administered 2022-07-25: 7 [IU] via SUBCUTANEOUS
  Administered 2022-07-25: 4 [IU] via SUBCUTANEOUS
  Administered 2022-07-26: 3 [IU] via SUBCUTANEOUS
  Administered 2022-07-26 (×2): 4 [IU] via SUBCUTANEOUS
  Administered 2022-07-27 – 2022-07-31 (×9): 3 [IU] via SUBCUTANEOUS
  Administered 2022-07-31 – 2022-08-02 (×4): 4 [IU] via SUBCUTANEOUS

## 2022-07-20 MED ORDER — PANTOPRAZOLE SODIUM 40 MG PO TBEC
40.0000 mg | DELAYED_RELEASE_TABLET | Freq: Every day | ORAL | Status: DC
  Administered 2022-07-20 – 2022-08-01 (×13): 40 mg via ORAL
  Filled 2022-07-20 (×13): qty 1

## 2022-07-20 MED ORDER — MELATONIN 3 MG PO TABS
3.0000 mg | ORAL_TABLET | Freq: Every day | ORAL | Status: DC
  Administered 2022-07-20 – 2022-08-01 (×12): 3 mg via ORAL
  Filled 2022-07-20 (×12): qty 1

## 2022-07-20 MED ORDER — INSULIN ASPART 100 UNIT/ML IJ SOLN
0.0000 [IU] | Freq: Every day | INTRAMUSCULAR | Status: DC
  Administered 2022-07-20 – 2022-07-22 (×3): 3 [IU] via SUBCUTANEOUS
  Administered 2022-07-23 – 2022-07-24 (×2): 2 [IU] via SUBCUTANEOUS

## 2022-07-20 MED ORDER — INSULIN ASPART 100 UNIT/ML IJ SOLN: 0.0000 [IU] | Freq: Three times a day (TID) | INTRAMUSCULAR | Status: DC

## 2022-07-20 MED ORDER — PHENYTOIN SODIUM 50 MG/ML IJ SOLN
100.0000 mg | Freq: Three times a day (TID) | INTRAMUSCULAR | Status: DC
  Administered 2022-07-20 – 2022-07-23 (×9): 100 mg via INTRAVENOUS
  Filled 2022-07-20 (×11): qty 2

## 2022-07-20 MED ORDER — INSULIN ASPART 100 UNIT/ML IJ SOLN: 4.0000 [IU] | Freq: Three times a day (TID) | INTRAMUSCULAR | Status: DC

## 2022-07-20 MED ORDER — INSULIN ASPART 100 UNIT/ML IJ SOLN: 2.0000 [IU] | Freq: Three times a day (TID) | INTRAMUSCULAR | Status: DC

## 2022-07-20 NOTE — Progress Notes (Signed)
Inpatient Rehab Admissions Coordinator Note:   Per PT patient was screened for CIR candidacy by Elky Funches Danford Bad, CCC-SLP. At this time, pt appears to be a potential candidate for CIR. If pt would like to be considered, please place an IP Rehab MD consult order.    Gayland Curry, [place], [place] Admissions Coordinator [phone] 07/20/22 5:47 PM

## 2022-07-20 NOTE — Progress Notes (Signed)
   Palliative Medicine Inpatient Follow Up Note   HPI:  61 year old woman PMHx significant for HTN, T2DM, hypothyroidism, SCLC (s/p chemotherapy, followed by Dr. Marin Olp) with known liver metastasis and recently diagnosed brain metastasis with plan for whole brain radiation (not yet started). Found to be in status epilepticus (cerebral metastases from [facility] ). Palliative care has been asked to get involved for further [place] conversation in the setting of ongoing seizure activirty.   Today's Discussion 07/20/2022  *Please note that this is a verbal dictation therefore any spelling or grammatical errors are due to the "[place] [employer]" system interpretation.  Chart reviewed inclusive of vital signs, progress notes, laboratory results, and diagnostic images.   I met with Pamela [name] at bedside this morning. At the time I had arrived the nursing staff informed me that she had another seizure at 6AM this morning.   Upon assessment Pamela [name] was fully alert and oriented. She was clearer in speech today.  We reviewed the goals which are to see how the weekend goes in terms of seizure management for further clarity on whether or not Pamela [name] will be able to transition to [facility] for radiation. Dr. Marin Olp is meeting with her daily. Patient shares she is trusting of he and his judgement.   Discussed cardiopulmonary resuscitation status again, as of this morning goals are for full code.  Questions and concerns addressed/Palliative Support Provided.   Objective Assessment: Vital Signs Vitals:   07/20/22 0700 07/20/22 0800  BP: 131/62   Pulse: 76   Resp: 16   Temp:  98.7 F (37.1 C)  SpO2: 95%     Intake/Output Summary (Last 24 hours) at 07/20/2022 0941 Last data filed at 07/20/2022 0000 Gross per 24 hour  Intake 559.83 ml  Output 1600 ml  Net -1040.17 ml   Last Weight  Most recent update: 07/17/2022  3:41 AM    Weight  76.3 kg (168 lb 3.4 oz)            Gen: Older caucasian F - chronically ill  appearing HEENT: dry mucous membranes CV: Regular rate and rhythm  PULM: ON RA, breathing is even and nonlabored ABD: soft/nontender  EXT: Mild BLE edema  Neuro: Alert and oriented x3 - conversant this morning, speech clearer  SUMMARY OF RECOMMENDATIONS   FULL CODE / Full scope of care at this time --> Confirmed that if re-intubation is needed patient and family would like to pursue this   Goal is for Pamela [name]'s seizures to be under control and to get to [facility] for brain radiation   Ongoing PMT support  Billing based on MDM: High ______________________________________________________________________________________ [place] Team Team Cell Phone: [phone] Please utilize secure chat with additional questions, if there is no response within 30 minutes please call the above phone number  Palliative Medicine Team providers are available by phone from 7am to 7pm daily and can be reached through the team cell phone.  Should this patient require assistance outside of these hours, please call the patient's attending physician.

## 2022-07-20 NOTE — Procedures (Incomplete)
Patient Name: Pamela [name]  MRN: [id number]  Epilepsy Attending: Lora Havens  Referring Physician/Provider: Amie Portland, MD  Duration: 07/20/2022 1116 to 07/21/2022 02111   Patient history: 61 year old female with non-small cell lung cancer with brain mets presented with seizures. EEG to eval for status epilepticus   Level of alertness: lethargic, asleep   AEDs during EEG study:  LEV, LCM, PHT   Technical aspects: This EEG study was done with scalp electrodes positioned according to the 10-20 International system of electrode placement. Electrical activity was reviewed with band pass filter of 1-70Hz , sensitivity of 7 uV/mm, display speed of 93mm/sec with a 60Hz  notched filter applied as appropriate. EEG data were recorded continuously and digitally stored.  Video monitoring was available and reviewed as appropriate.   Description: The posterior dominant rhythm consists of 7.5 Hz activity of moderate voltage (25-35 uV) seen predominantly in posterior head regions, symmetric and reactive to eye opening and eye closing. Sleep was characterized by vertex waves, sleep spindles (12 to 14 Hz), maximal frontocentral region. Intermittent generalized and maximal right temporoparietal region 3-6Hz  theta-delta slowing was  noted. Abundant sharp waves were also noted in right temporo-parietal region. Hyperventilation and photic stimulation were not performed.      ABNORMALITY - Sharp wave, right temporoparietal region - Intermittent slow, generalized and maximal right temporoparietal region   IMPRESSION: This study showed evidence of epileptogenicity and cortical dysfunction arising from right temporoparietal region likely secondary to underlying mass. Additionally there was mild to moderate diffuse encephalopathy, nonspecific etiology. No seizures were seen during this study.  Faye Sanfilippo Barbra Sarks

## 2022-07-20 NOTE — Progress Notes (Signed)
NAME:  Pamela [name], MRN:  [id number], DOB:  [birth year], LOS: 4 ADMISSION DATE:  07/16/2022 CONSULTATION DATE:  07/16/2022 REFERRING MD:  [facility] CHIEF COMPLAINT:  Seizures   History of Present Illness:  61 year old woman who presented to [facility] 10/30 via EMS after being found down at home with seizure-like activity. LKW 2040, found around 2245 having L-sided partial seizures and hyperglycemic to 300s. Recent transient spontaenously-resolving episodes of slurred speech at home per family. PMHx significant for HTN, T2DM, hypothyroidism, SCLC (s/p chemotherapy, followed by Dr. Marin Olp) with known liver metastasis and recently diagnosed brain metastasis with plan for whole brain radiation (not yet started).   On EMS arrival, patient was reportedly mostly unresponsive with ongoing L-sided partial seizure activity. Versed IV (15m total) administered without cessation of seizure. Labs were notable for WBC 11.6, H&H/Plt WNL, hyponatremia with Na 126, K 6.2, serum glucose 836, BUN 49, Cr 1.49 (baseline 0.9). LFTs elevated with AST/ALT 76/110, Alk Phos 226. INR WNL. UA with > 500 glucose; UDS +BZD (s/p Ativan/Versed). Ethanol negative. [facility]; Propofol gtt + Versed initiated. Neuro consulted; rapid EEG completed. Keppra load given + fosphenytoin. CT Head with multiple hyperdense/hemorrhagic metastases (largest 2.6cm, unchanged) with edema of R frontal and parietal/occipital lobes. CT C-spine negative.   [facility] P[facility]M to admit.  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 Intubated on arrival to ED for airway protection. CT Head with known hemorrhagic brain mets. Hyperglycemic to 600s. [facility]  11/1 episodes of jerking seen without EEG correlate. Extubated 11/2, LTM discontinued   Interim History / Subjective:  Loading with PTN this morning per neuro after seizure at 630.   Objective:  Blood pressure 131/62, pulse 76, temperature 98.6 F (37 C), temperature source Oral, resp. rate 16, height _0  (1.575 m), weight 76.3 kg, SpO2 95 %.        Intake/Output Summary (Last 24 hours) at 07/20/2022 0742 Last data filed at 07/20/2022 0000 Gross per 24 hour  Intake 559.83 ml  Output 1600 ml  Net -1040.17 ml   Filed Weights   07/17/22 0300  Weight: 76.3 kg    Physical Examination: General appearance: 61y.o., female, more alert today Eyes: tracks Lungs: diminished bl, with normal respiratory effort CV: RRR, no murmur  Abdomen: Soft, non-tender; non-distended, BS present Extremities: no peripheral edema, warm Neuro: moves all ext spont, follows commands, answers questions appropriately  Labs/imaging reviewed Lytes ok, PLT 89  CTH with apparent increase in burden of metastatic disease and edema   Resolved issues: Acute hypoxic respiratory failure - intubated in setting of status epilepticus  Assessment & Plan:   Status epilepticus ([place] Likely secondary to cerebral metastases from [place]- AEDs per neuro - may need LTM again, possibly elective intubation - palliative care following  Hyperglycemia Due to steroids for vasogenic edema and uncontrolled type  2 DM  - basal/SSI  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 Antonieta Pert input  SCLC (small cell lung carcinoma) ([place]) Diagnosed 8 months ago at stage IV- Limited pathway forward. Needs to recover from status epilepticus rapidly for her to have a realistic chance to get back to treatment .   Thrombocytopenia 4Ts score low probability for HIT - will nonetheless hold sq heparin in setting of bleeding CNS metastases   Best Practice: (right click and "Reselect all SmartList Selections" daily)   Diet/type: NPO DVT prophylaxis: prophylactic heparin  GI prophylaxis: PPI Lines: Port, R upper chest Foley:  Yes, and it is still needed Code Status:  full code Last date of multidisciplinary goals of care discussion [family updated 11/3, palliative care consutled. Difficult scenario, would still want to pursue elective intubation for more aggressive seizure control if reloading AEDs unsuccessful]

## 2022-07-20 NOTE — Progress Notes (Signed)
Reported a seizure around 0630. Per Dr. Karolee Stamps plan - will load with PHT. Continue LTM and await read  -- Amie Portland, MD Neurologist [facility] Pager: [phone]

## 2022-07-20 NOTE — Progress Notes (Signed)
Unfortunately, she began to have seizures again yesterday.  When I saw her this morning, she actually looked and sounded pretty decent.  Unfortunately, she had another seizure after I had left.  She is not intubated.  When I saw her, she seemed to be a little more alert than I would have thought.  She did answer some my questions.  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  The problem is I am unsure how she can have radiation therapy while hooked up to all the equipment that she is on.  I will know if she would be able to tolerate radiation therapy.  Hopefully, we will see how things go over the weekend.  If she is not can be able to have any radiation therapy, I really think we are going to have to think about getting Hospice involved.  She really would not be a candidate for any systemic therapy from my point of view.  There are no labs back yet.  Her vital signs are all stable.  Blood pressure is 156/81.  Pulse is 77.  Temperature 98.6.  Her lungs are clear bilaterally.  Cardiac exam regular rate and rhythm.  Neurological exam is nonfocal.  Abdominal exam is nonfocal.  She has good bowel sounds.  There is no guarding or rebound tenderness.  Liver edge may be at the right costal margin.  I just hate the fact that these seizures are a real problem now.  I know that Neurology is following very closely and try their best to try to minimize seizure activity.  We will follow along and try to help out in any way that we can.  I think it is going to come down to whether or not Radiation Oncology thinks that they will be able to give her radiotherapy for the CNS mets.  [name], MD  [place] [phone]

## 2022-07-20 NOTE — Progress Notes (Signed)
Radiation Oncology         [phone] ________________________________  Initial Inpatient Consultation  Name: Pamela [name] MRN: [id number]  Date: 07/22/2022  DOB: Feb 12, 1961  CC:Pamela Elk, MD  Pamela Napoleon, MD   REFERRING PHYSICIAN: [name], MD  DIAGNOSIS: No diagnosis found.  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 Diagnosed with small cell lung cancer in February 2023 with liver metastases s/p 6 cycles of chemotherapy. Now with evidence of overall disease progression and new intracranial metastases (October 2023).    Cancer Staging  Small cell lung cancer [facility]) Staging form: Lung, AJCC 8th Edition - Clinical stage from 11/03/2021: Stage IVB (cT2, cN2, pM1c) - Signed by Sindy Guadeloupe, MD on 11/03/2021   CHIEF COMPLAINT: Here to discuss management of brain metastasis from lung cancer primary.  HISTORY OF PRESENT ILLNESS::Pamela [name] is a 61 y.o. female who presented today for consideration of radiation therapy in management of her recently diagnosed brain metastasis from small cell lung cancer primary.   The patient was initially diagnosed with lung cancer this past February 2023 after presenting with left chest wall pain. This prompted a CT scan of the chest abdomen and pelvis on 10/18/21 which revealed: diffuse liver metastases,signs of pleural spread of tumor within the left hemithorax with a small partially loculated left pleural effusion; multiple pleural based soft tissue nodules overlying the visualized portions of the left lung base; and enlarged cardiophrenic angle lymph nodes compatible with nodal metastasis. Biopsy of the liver on 10/29/21 showed metastatic small cell carcinoma.   MRI of the brain on 11/13/21 showed no definite evidence of intracranial metastatic disease or other acute abnormalities. MRI however showed a 1 cm T1 hypointense lesion/area within the C4 vertebral body, nonspecific and possibly  degenerative in etiology, although an osseous metastasis could not be entirely excluded. (A small presumed small subdural hematoma was also appreciated but without associated mass effect).  She established care with Dr. Janese Banks at [facility] and agreed to proceed with chemotherapy consisting Atezolizuma, Carboplatin, and Etoposide.  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 She initiated chemotherapy while inpatient on 11/05/21, but developed kidney failure and liver injury suspected to be secondary to systemic treatment (per attending physician). Given her renal and liver impairment, her chemo dose was reduced with improvement in her renal and liver function. However, it seems as though her kidney failure and liver injury may have been secondary to her tumor lysis. The patient also presented with significant edema and cellulitis in her legs, for which she was given IV lasix. She also required wrapping of her legs by wound care to manage weeping fluid.    Following discharge, the patient established OP care with Dr. Marin Olp on 11/26/21. (The patient had met with Dr. Marin Olp while inpatient). Given her good tolerance of dose reduced chemo, Dr. Marin Olp was able to add Tecentriq with her second cycle of treatment.   Follow-up CT of the chest abdomen and pelvis on 12/10/21 showed evidence of a good treatment response, demonstrated by: an interval decrease in size of the mass in the peripheral -superior segment LLL; interval resolution of bulky left hilar and subcarinal lymph nodes; no persistently enlarged mediastinal lymph nodes; and near complete resolution of the previously extensive pleural thickening and nodularity about the left lung. CT also showed no significant change in volume of the small left pleural effusion, and  homogeneous hypo-enhancement but a general decrease in size of the innumerable  hypodense liver lesions. CT otherwise showed no evidence of new metastatic disease in the chest, abdomen, or pelvis.  Repeat MRI of the brain on 12/13/21 again showed no evidence of intracranial metastatic disease or acute intracranial abnormality. MRI also showed resolution of the small left subdural hematoma seen on MRI from the month prior.    Of note: chemotherapy was held during the week of 01/02/22 due to labs showing a drop in Hgb to 7.8. She was given 2 units of PRBC. Treatment was again held in May due to GI bleeding.  This prompted an upper endoscopy performed in May which did find anything to account for her bleeding. She was able to resume chemo on 01/21/22.   Repeat CT of the chest abdomen and pelvis on 01/30/22 showed a continued decrease in size of the LLL mass; resolution of a previously seen left pleural effusion; minimal residual left-sided pleural thickening; no persistent lymphadenopathy in the chest; and a decrease in size of the numerous hypoenhancing lesions throughout the liver.    MRI of the brain on 04/04/22 again showed no evidence of intracranial metastatic disease.    The patient completed her final cycle chemotherapy on 03/07/22. Post-treatment CT CAP on 04/04/22 again demonstrated a decrease in size of the superior LLL nodule and the bilobar hepatic metastases consistent with a good treatment response. However, a new nonspecific 13 mm subpleural nodular density in the left lung base was appreciated. CT otherwise showed no evidence of new or progressive disease in the abdomen or pelvis and no thoracic adenopathy.  Given that she did not have extensive tumor burden, particular in her liver, Dr. Marin Olp did not see a role for radiation therapy to the chest. For maintenance therapy, the patient opted to proceed with immunotherapy consisting of Tecentriq.   In August, the patient developed SOB. To rule out PE, Dr. Marin Olp ordered a CTA of the chest on 05/01/22 which  unfortunately showed evidence of disease progression, demonstrated by enlargement of a peripheral nodule in the superior LLL, numerous new surrounding nodules in the superior segment of the left lower lobe, numerous pleural based nodules in the left mid to lower lung with a small left pleural effusion; new left hilar adenopathy; enlarging lymph nodes in the AP window and upper abdomen concerning for metastases; and an increase in size and number of hepatic metastases.  Subsequently, the patient was started on lurbinectedin on 05/06/22.   MRI of the brain performed on 07/09/22 (prompted due to new onset of aphasia and headaches) unfortunately showed numerous new intracranial metastases. The largest lesion are located along the right frontal operculum, measuring 17 x 12 mm, within the right occipital lobe measuring 26 x 19 mm, and along the medial left temporal lobe measuring 20 x 16 mm. MRI also showed edema associated with several lesions, most notable mild-to-moderate edema associated with the lesion along the right frontal operculum, and moderate edema surrounding the lesion within the right occipital lobe. Non-acute blood products were also seen associated with several lesions, mainly superficially along the brain parenchyma (some abutting the dura), likely reflecting a combination of parenchymal metastases and leptomeningeal metastatic disease.   CT CAP on 07/09/22 showed: interval progression of multiple pulmonary nodules in the left lung consistent with disease progression; multiple new enlarged lymph nodes in the lower paraesophageal region and retroperitoneal space of the abdomen, consistent with metastatic disease; and interval development of multiple new hepatic metastases. CT  also showed a Bbranching nodularity in the peripheral left lower lobe, possibly related to tumor invasion of an airway or atypical infection.   The patient was accordingly referred to Dr. Baruch Gouty on 07/15/22 for  consideration of palliative whole brain radiation. Dr. Baruch Gouty planned on delivering 30 Pearline Cables in 12 fractions at 69 (fx based on the small cell nature of her disease). Dr. Baruch Gouty also ordered a head CT for treatment planning.   The following day (07/16/22), the patient presented to the [facility] ED after having a seizure at home with associated lack of vision. EMS arrived at the scene and found her to be mostly unresponsive with continued left-sided partial seizures. EMS also noted her blood glucose was in the 300s.  She was placed her on a no-rebreather and given 10 mg of IV Versed but she continued to seize. Upon arrival to the [facility] ED, she was intubated and administered ativan. CT of the head performed in the ED at [place] redemonstrated multiple hyperdense/hemorrhagic metastases. The largest lesion was again seen in the posterior right occipital lobe, measuring 2.6 cm, and unchanged in size. Areas of edema within the right frontal and parietal/occipital lobes were also redemonstrated. CT of the cervical spine also performed showed no acute bony abnormalities.   Given that neuro ICU at [facility] was at capacity, she was transferred and admitted directly to the ICU where she remains presently.   Her most recent CT of the head performed on 07/19/22 showed an interval increase in size of the multiple scattered metastatic lesions, consistent with progressive disease. The scattered areas of vasogenic edema also appear slightly progressed and worsened, most notably at the left parietal region. There is no appreciable midline shift.   PREVIOUS RADIATION THERAPY: No  PAST MEDICAL HISTORY:  has a past medical history of Colon cancer ([place]) (2003), Diabetes mellitus without complication ([place]), 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, Hypertension, Personal history of chemotherapy (2003), SCLC (small cell lung carcinoma) ([place]), and Uterine cancer ([place]) (2007).    PAST SURGICAL HISTORY: Past Surgical  History:  Procedure Laterality Date   ABDOMINAL HYSTERECTOMY     BREAST EXCISIONAL BIOPSY Right 11/26/2010   neg/- PROLIFERATIVE FIBROCYSTIC CHANGE WITH ASSOCIATED    CHOLECYSTECTOMY     COLON SURGERY     2003   COLONOSCOPY     COLONOSCOPY WITH PROPOFOL N/A 12/11/2018   Procedure: COLONOSCOPY WITH PROPOFOL;  Surgeon: Lollie Sails, MD;  Location: [facility] ENDOSCOPY;  Service: Endoscopy;  Laterality: N/A;   ESOPHAGOGASTRODUODENOSCOPY (EGD) WITH PROPOFOL N/A 01/15/2022   Procedure: ESOPHAGOGASTRODUODENOSCOPY (EGD) WITH PROPOFOL;  Surgeon: Lesly Rubenstein, MD;  Location: [facility] ENDOSCOPY;  Service: Endoscopy;  Laterality: N/A;  DM   IR IMAGING GUIDED PORT INSERTION  11/20/2021    FAMILY HISTORY: family history includes Breast cancer (age of onset: 76) in her cousin; Breast cancer (age of onset: 47) in her cousin; Breast cancer (age of onset: 3) in her paternal aunt; Breast cancer (age of onset: 24) in her maternal aunt.  SOCIAL HISTORY:  reports that she quit smoking about 9 months ago. Her smoking use included cigarettes. She has a 43.00 pack-year smoking history. She has never used smokeless tobacco. She reports that she does not currently use alcohol. She reports that she does not use drugs.  ALLERGIES: Rofecoxib  MEDICATIONS:  No current facility-administered medications for this encounter.   No current outpatient medications on file.   Facility-Administered Medications Ordered in Other Encounters  Medication Dose Route Frequency Provider Last Rate Last  Admin   acetaminophen (TYLENOL) tablet 650 mg  650 mg Oral Q4H PRN Judd Lien, MD   650 mg at 07/20/22 1225   Chlorhexidine Gluconate Cloth 2 % PADS 6 each  6 each Topical q morning Amie Portland, MD   6 each at 07/19/22 1630   dexamethasone (DECADRON) injection 4 mg  4 mg Intravenous Q6H Amie Portland, MD   4 mg at 07/20/22 1217   dexmedetomidine (PRECEDEX) 400 MCG/100ML (4 mcg/mL) infusion  0.4-1.2 mcg/kg/hr Intravenous  Titrated Kipp Brood, MD   Stopped at 07/18/22 0846   dextrose 50 % solution 0-50 mL  0-50 mL Intravenous PRN [place] Crane M, PA-C       docusate sodium (COLACE) capsule 100 mg  100 mg Oral BID Ventura Sellers, RPH   100 mg at 07/20/22 0945   insulin aspart (novoLOG) injection 0-20 Units  0-20 Units Subcutaneous TID with meals Ventura Sellers, [facility]   4 Units at 07/20/22 1219   insulin aspart (novoLOG) injection 0-5 Units  0-5 Units Subcutaneous QHS Maryjane Hurter, MD       lacosamide (VIMPAT) 200 mg in sodium chloride 0.9 % 25 mL IVPB  200 mg Intravenous Q12H Lora Havens, MD 90 mL/hr at 07/20/22 1100 Infusion Verify at 07/20/22 1100   levETIRAcetam (KEPPRA) 2,000 mg in sodium chloride 0.9 % 250 mL IVPB  2,000 mg Intravenous Q12H Lora Havens, MD   Stopped at 07/20/22 1004   levothyroxine (SYNTHROID) tablet 125 mcg  125 mcg Oral Q0600 Ventura Sellers, RPH   125 mcg at 07/20/22 0555   LORazepam (ATIVAN) injection 2 mg  2 mg Intravenous Q4H PRN Lora Havens, MD   2 mg at 07/19/22 0831   Oral care mouth rinse  15 mL Mouth Rinse PRN Kipp Brood, MD       pantoprazole (PROTONIX) EC tablet 40 mg  40 mg Oral QHS Esmeralda Arthur W, RPH       phenol (CHLORASEPTIC) mouth spray 1 spray  1 spray Mouth/Throat PRN Kipp Brood, MD   1 spray at 07/18/22 1618   phenytoin (DILANTIN) injection 100 mg  100 mg Intravenous Q8H Kerney Elbe, MD   100 mg at 07/20/22 1337   polyethylene glycol (MIRALAX / GLYCOLAX) packet 17 g  17 g Oral Daily Ventura Sellers, RPH        REVIEW OF SYSTEMS:  Notable for that above.   PHYSICAL EXAM:  vitals were not taken for this visit.   General: Alert and oriented, in no acute distress *** HEENT: Head is normocephalic. Extraocular movements are intact. Oropharynx is clear. Neck: Neck is supple, no palpable cervical or supraclavicular lymphadenopathy. Heart: Regular in rate and rhythm with no murmurs, rubs, or gallops. Chest: Clear to  auscultation bilaterally, with no rhonchi, wheezes, or rales. Abdomen: Soft, nontender, nondistended, with no rigidity or guarding. Extremities: No cyanosis or edema. Lymphatics: see Neck Exam Skin: No concerning lesions. Musculoskeletal: symmetric strength and muscle tone throughout. Neurologic: Cranial nerves II through XII are grossly intact. No obvious focalities. Speech is fluent. Coordination is intact. Psychiatric: Judgment and insight are intact. Affect is appropriate.   ECOG = ***  0 - Asymptomatic (Fully active, able to carry on all predisease activities without restriction)  1 - Symptomatic but completely ambulatory (Restricted in physically strenuous activity but ambulatory and able to carry out work of a light or sedentary nature. For example, light housework, office work)  2 - Symptomatic, <50% in bed  during the day (Ambulatory and capable of all self care but unable to carry out any work activities. Up and about more than 50% of waking hours)  3 - Symptomatic, >50% in bed, but not bedbound (Capable of only limited self-care, confined to bed or chair 50% or more of waking hours)  4 - Bedbound (Completely disabled. Cannot carry on any self-care. Totally confined to bed or chair)  5 - Death   Eustace Pen MM, Creech RH, Tormey DC, et al. [phone]). "Toxicity and response criteria of the [facility]". [place] Oncol. 5 (6): 649-55   LABORATORY DATA:  Lab Results  Component Value Date   WBC 9.3 07/20/2022   HGB 9.9 (L) 07/20/2022   HCT 29.4 (L) 07/20/2022   MCV 98.3 07/20/2022   PLT 89 (L) 07/20/2022   CMP     Component Value Date/Time   NA 140 07/20/2022 0620   K 3.8 07/20/2022 0620   CL 101 07/20/2022 0620   CO2 24 07/20/2022 0620   GLUCOSE 94 07/20/2022 0620   BUN 11 07/20/2022 0620   CREATININE 0.72 07/20/2022 0620   CREATININE 0.90 06/25/2022 0929   CALCIUM 9.2 07/20/2022 0620   PROT 6.3 (L) 07/20/2022 0620   ALBUMIN 2.9 (L) 07/20/2022  0620   AST 60 (H) 07/20/2022 0620   AST 44 (H) 06/25/2022 0929   ALT 100 (H) 07/20/2022 0620   ALT 32 06/25/2022 0929   ALKPHOS 176 (H) 07/20/2022 0620   BILITOT 0.8 07/20/2022 0620   BILITOT 0.7 06/25/2022 0929   GFRNONAA >60 07/20/2022 0620   GFRNONAA >60 06/25/2022 0929         RADIOGRAPHY: CT HEAD WO CONTRAST (5MM)  Result Date: 07/19/2022 CLINICAL DATA:  Initial evaluation for altered mental status. History of metastatic small cell lung cancer. EXAM: CT HEAD WITHOUT CONTRAST TECHNIQUE: Contiguous axial images were obtained from the base of the skull through the vertex without intravenous contrast. RADIATION DOSE REDUCTION: This exam was performed according to the departmental dose-optimization program which includes automated exposure control, adjustment of the mA and/or kV according to patient size and/or use of iterative reconstruction technique. COMPARISON:  Prior head CT from 07/16/2022. FINDINGS: Brain: Multiple scattered metastatic lesions again seen. The dominant lesion at the right occipital convexity is increased in size, now measuring 3.3 x 2.6 x 2.3 cm, previously 2.7 x 2.4 x 1.8 cm when measured in a similar fashion. Additional right frontal parafalcine lesion also increased in size now measuring up to 1.5 cm, previously 1 cm. Associated hyperdensity consistent with hemorrhage/blood products. Multifocal areas of vasogenic edema again seen, worsened within the left parietal region, suggesting progressive disease (series 3, image 23). No midline shift. Basilar cisterns remain patent. No other acute intracranial hemorrhage or large vessel territory infarct. No hydrocephalus or extra-axial fluid collection. Vascular: No hyperdense vessel. Skull: Scalp soft tissues and calvarium demonstrate no acute finding. Sinuses/Orbits: Globes orbital soft tissues demonstrate no acute finding. Air-fluid level with pneumatized secretions noted within the left maxillary sinus. No mastoid effusion.  Other: None. IMPRESSION: 1. Interval increase in size of multiple scattered metastatic lesions as above, consistent with progressive disease. Scattered areas of vasogenic edema appear slightly progressed and worsened as well, most notable at the left parietal region. No midline shift. 2. No other acute intracranial abnormality. 3. Air-fluid level with pneumatized secretions within the left maxillary sinus, suggesting acute sinusitis. Electronically Signed   By: Jeannine Boga M.D.   On: 07/19/2022 23:18   Overnight  EEG with video  Result Date: 07/16/2022 Lora Havens, MD     07/17/2022 10:27 AM Patient Name: HIILANI JETTER MRN: [id number] Epilepsy Attending: Lora Havens Referring Physician/Provider: Amie Portland, MD Duration: 07/16/2022 0174 to 07/17/2022 0523 Patient history: 61 year old female with non-small cell lung cancer with brain mets presented with seizures. EEG to eval for status epilepticus Level of alertness:  comatose AEDs during EEG study:  LEV, LCM, Propofol Technical aspects: This EEG study was done with scalp electrodes positioned according to the 10-20 International system of electrode placement. Electrical activity was reviewed with band pass filter of 1-_0 , sensitivity of 7 uV/mm, display speed of 54m/sec with a _1  notched filter applied as appropriate. EEG data were recorded continuously and digitally stored.  Video monitoring was available and reviewed as appropriate. Description: EEG showed continuous generalized 3 to 7 Hz theta and delta slowing lasting 2 to 4 seconds admixed with 15 to 18 Hz beta activity lasting 5 to 7 seconds. Hyperventilation and photic stimulation were not performed.   Event button was pressed on 07/16/2022 at 1521 during which patient was noted to have bilateral upper extremity twitching.  Concomitant EEG before, during and after the event did not show any EEG changes suggest seizure. Event button was pressed on 07/16/2022 at 2001 during which  patient was noted to have right upper extremity twitching. Concomitant EEG before, during and after the event did not show any EEG changes suggest seizure. Event button was pressed on 07/16/2022 at 2209 during which patient was noted to have left upper extremity abduction followed by qasi-rhythmic jerking. Concomitant EEG before, during and after the event did not show any EEG changes suggest seizure. Event button was pressed on 06/28/2022 at 2205 during which patient's left upper extremity was flexed at the elbow and had intermittent twitching. Concomitant EEG before, during and after the event did not show any EEG changes suggest seizure.  ABNORMALITY - Continuous slow, generalized - Excessive beta, generalized  IMPRESSION: This study is suggestive of severe diffuse encephalopathy, nonspecific etiology but likely related to sedation. No seizures or epileptiform discharges were seen throughout the recording. Multiple events were recorded as described above without concomitant EEG change.  However, focal motor seizures may not be seen on scalp EEG.  Clinical correlation is recommended.  Priyanka OBarbra Sarks  Rapid EEG  Result Date: 07/16/2022 YLora Havens MD     07/16/2022  9:16 AM Patient Name: TDOMINIQUA COONERMRN: [id number] Attending: PLora HavensReferring Physician/Provider: WNyra Jabs DO Duration: 07/16/2022 06384to 0406 Patient history: 61year old female with non-small cell lung cancer with brain mets presented with seizures. Rapid EEG to eval for status epilepticus Level of alertness: comatose AEDs during EEG study: LEV, LCM, Propofol Technical aspects: This EEG was obtained using a 10 lead EEG system positioned circumferentially without any parasagittal coverage (rapid EEG). Computer selected EEG is reviewed as  well as background features and all clinically significant events. Description: EEG showed continuous generalized 3 to 7 Hz theta and delta slowing lasting 2 to 4 seconds  admixed with 15 to 18 Hz beta activity lasting 5 to 7 seconds. Hyperventilation and photic stimulation were not performed.   ABNORMALITY - Continuous slow, generalized - Excessive beta, generalized IMPRESSION: This rapid eeg is suggestive of severe diffuse encephalopathy, nonspecific etiology but likely related to sedation. No seizures or epileptiform discharges were seen throughout the recording. PLora Havens  DG Abd Portable 1V  Result Date: 07/16/2022 CLINICAL  DATA:  Orogastric tube placement EXAM: PORTABLE ABDOMEN - 1 VIEW COMPARISON:  Portable exam 0714 hours compared to 07/16/2022 at 0112 hours FINDINGS: Tip of nasogastric tube projects over mid stomach. Scattered surgical clips RIGHT upper quadrant. Paucity of bowel gas. Elevation of RIGHT diaphragm. Osseous demineralization. IMPRESSION: Tip of nasogastric tube projects over mid stomach. Electronically Signed   By: Lavonia Dana M.D.   On: 07/16/2022 08:29   DG Chest Port 1 View  Result Date: 07/16/2022 CLINICAL DATA:  Intubation EXAM: PORTABLE CHEST 1 VIEW COMPARISON:  Portable exam 0711 hours compared to 0110 hours FINDINGS: Tip of endotracheal tube projects 1.8 cm above carina. Nasogastric tube extends into stomach. RIGHT jugular Port-A-Cath tip projects over mid RIGHT atrium. Normal heart size, mediastinal contours, and pulmonary vascularity. Atherosclerotic calcification aorta. Chronic elevation of RIGHT diaphragm with RIGHT basilar atelectasis. Slight accentuation of LEFT perihilar markings without definite infiltrate, pleural effusion, or pneumothorax. Bones demineralized. IMPRESSION: Line and tube positions as above. RIGHT basilar atelectasis. Aortic Atherosclerosis (ICD10-I70.0). Electronically Signed   By: Lavonia Dana M.D.   On: 07/16/2022 08:28   CT Cervical Spine Wo Contrast  Result Date: 07/16/2022 CLINICAL DATA:  Neck trauma, mechanically unstable spine (Age >= 16y). Seizure EXAM: CT CERVICAL SPINE WITHOUT CONTRAST TECHNIQUE:  Multidetector CT imaging of the cervical spine was performed without intravenous contrast. Multiplanar CT image reconstructions were also generated. RADIATION DOSE REDUCTION: This exam was performed according to the departmental dose-optimization program which includes automated exposure control, adjustment of the mA and/or kV according to patient size and/or use of iterative reconstruction technique. COMPARISON:  None Available. FINDINGS: Alignment: Normal Skull base and vertebrae: No acute fracture. No primary bone lesion or focal pathologic process. Soft tissues and spinal canal: No prevertebral fluid or swelling. No visible canal hematoma. Disc levels:  Mild degenerative disc disease at C5-6 and C6-7. Upper chest: No acute findings Other: None IMPRESSION: No acute bony abnormality. Electronically Signed   By: Rolm Baptise M.D.   On: 07/16/2022 01:49   CT HEAD WO CONTRAST (5MM)  Result Date: 07/16/2022 CLINICAL DATA:  Seizure, new-onset, no history of trauma. History of small cell lung cancer. EXAM: CT HEAD WITHOUT CONTRAST TECHNIQUE: Contiguous axial images were obtained from the base of the skull through the vertex without intravenous contrast. RADIATION DOSE REDUCTION: This exam was performed according to the departmental dose-optimization program which includes automated exposure control, adjustment of the mA and/or kV according to patient size and/or use of iterative reconstruction technique. COMPARISON:  MRI 07/09/2022 FINDINGS: Brain: Multiple metastases again noted as seen on prior MRI. Hyperdense mass within the right occipital lobe measuring up to 2.6 cm, stable since recent MRI. Hyperdense lesion in the medial right frontal lobe measures 10 mm, stable. The numerous other metastases seen on prior MRI not as well visualized. Vasogenic edema seen within the right frontal and posterior parietal/occipital lobes. No hydrocephalus or midline shift. Vascular: No hyperdense vessel or unexpected  calcification. Skull: No acute calvarial abnormality. Sinuses/Orbits: No acute findings Other: None IMPRESSION: Multiple hyperdense/hemorrhagic metastases, not as well visualized as on prior/recent MRI. The largest lesion in the posterior right occipital lobe 2.6 cm, unchanged. Areas of edema within the right frontal and parietal/occipital lobes. Electronically Signed   By: Rolm Baptise M.D.   On: 07/16/2022 01:48   DG Abdomen 1 View  Result Date: 07/16/2022 CLINICAL DATA:  OG tube placement EXAM: ABDOMEN - 1 VIEW COMPARISON:  CT 07/09/2022 FINDINGS: OG tube tip is in the mid stomach. Nonobstructive  bowel gas pattern. Prior CABG. IMPRESSION: OG tube in the mid stomach. Electronically Signed   By: Rolm Baptise M.D.   On: 07/16/2022 01:43   DG Chest Portable 1 View  Result Date: 07/16/2022 CLINICAL DATA:  Intubation EXAM: PORTABLE CHEST 1 VIEW COMPARISON:  11/21/2021 FINDINGS: Right Port-A-Cath remains in place, unchanged. Endotracheal tube is 2 cm above the carina. NG tube is in the stomach. Mild elevation of the right hemidiaphragm. Heart and mediastinal contours are within normal limits. No focal opacities or effusions. No acute bony abnormality. Aortic atherosclerosis. IMPRESSION: Support devices as above. No active cardiopulmonary disease. Electronically Signed   By: Rolm Baptise M.D.   On: 07/16/2022 01:43   CT CHEST ABDOMEN PELVIS W CONTRAST  Result Date: 07/10/2022 CLINICAL DATA:  Small-cell lung cancer. Restaging. * Tracking Code: BO * EXAM: CT CHEST, ABDOMEN, AND PELVIS WITH CONTRAST TECHNIQUE: Multidetector CT imaging of the chest, abdomen and pelvis was performed following the standard protocol during bolus administration of intravenous contrast. RADIATION DOSE REDUCTION: This exam was performed according to the departmental dose-optimization program which includes automated exposure control, adjustment of the mA and/or kV according to patient size and/or use of iterative reconstruction  technique. CONTRAST:  160m OMNIPAQUE IOHEXOL 300 MG/ML  SOLN COMPARISON:  Chest CT 05/01/2022. Chest abdomen pelvis CT 04/04/2022. FINDINGS: CT CHEST FINDINGS Cardiovascular: The heart size is normal. No substantial pericardial effusion. Mediastinum/Nodes: Right Port-A-Cath tip is in the right [facility]. Distal paraesophageal node measures 1.6 cm short axis today on 43/2, increased from 0.8 cm previously. A second low paraesophageal node measuring 1.2 cm short axis on 41/2 has increased from 3 mm previously. Additional lower esophageal lymphadenopathy evident. 7 mm prevascular node on 18/2 was 3 mm previously. There is no hilar lymphadenopathy. The esophagus has normal imaging features. There is no axillary lymphadenopathy. Lungs/Pleura: Atelectasis noted right lung base. 11 x 8 mm peripheral left lower lobe nodule measured previously at has increased measuring 1.5 x 1.4 cm today on 56/4. Adjacent new perifissural nodules are seen with satellite nodules tracking centrally towards the posterior right hilum. 10 mm left lower lobe subpleural nodule adjacent to the aorta (66/4) is new in the interval. Branching nodularity in the peripheral left lower lobe on 51/4 may relate to tumor invasion of an airway or atypical infection. 1.5 cm subpleural nodule left lower lobe on 85/4 was 0.6 cm previously. 2.4 cm subpleural left lower lobe nodule in the costophrenic sulcus (114/4) was 1.3 cm previously. No pleural effusion. Musculoskeletal: No worrisome lytic or sclerotic osseous abnormality. CT ABDOMEN PELVIS FINDINGS Hepatobiliary: Heterogeneous liver perfusion again noted with multiple hepatic metastases in both hepatic lobes. 10 mm lesion in the posterior hepatic dome measured previously is similar on image 34/2 today. 2.0 cm right hepatic lesion on 43/2 today was not definitely visible on the prior study. 1.9 cm lateral segment left hepatic lobe lesion on 56/2 also appears new in the interval. Gallbladder surgically absent.  No intrahepatic or extrahepatic biliary dilation. Pancreas: No focal mass lesion. No dilatation of the main duct. No intraparenchymal cyst. No peripancreatic edema. Spleen: No splenomegaly. No focal mass lesion. Adrenals/Urinary Tract: No adrenal nodule or mass. Left kidney unremarkable. Stable small cyst lower pole right kidney. No followup imaging is recommended. No evidence for hydroureter. Bladder decompressed. Stomach/Bowel: Stomach is unremarkable. No gastric wall thickening. No evidence of outlet obstruction. Duodenum is normally positioned as is the ligament of Treitz. No small bowel wall thickening. No small bowel dilatation. Enterocolic anastomosis identified right  abdomen. No gross colonic mass. No colonic wall thickening. Vascular/Lymphatic: There is moderate atherosclerotic calcification of the abdominal aorta without aneurysm. 1.7 cm short axis left para-aortic node on 59/2 was 0.6 cm previously. 1.1 cm aortocaval node on 61/2 is progressive. No pelvic sidewall lymphadenopathy. Reproductive: Uterus surgically absent.  There is no adnexal mass. Other: No intraperitoneal free fluid. Musculoskeletal: No worrisome lytic or sclerotic osseous abnormality. IMPRESSION: 1. Interval progression of multiple pulmonary nodules in the left lung consistent with disease progression. 2. Multiple new enlarged lymph nodes are seen in the lower paraesophageal region and retroperitoneal space of the abdomen, consistent with metastatic disease. 3. Interval development of multiple new hepatic metastases. Previously identified hepatic metastases are less conspicuous today suggesting that there has been a mixed interval response to therapy. However, generalized trend in the liver is towards progressive disease as more hepatic metastases are visible on today's study than on the prior exam. 4. Branching nodularity in the peripheral left lower lobe may relate to tumor invasion of an airway or atypical infection. 5. Aortic  Atherosclerosis (ICD10-I70.0). Electronically Signed   By: Misty Stanley M.D.   On: 07/10/2022 15:26   MR Brain W Wo Contrast  Result Date: 07/09/2022 CLINICAL DATA:  Provided history: Small cell lung cancer. Small cell lung cancer, assess treatment response; visual changes. EXAM: MRI HEAD WITHOUT AND WITH CONTRAST TECHNIQUE: Multiplanar, multiecho pulse sequences of the brain and surrounding structures were obtained without and with intravenous contrast. CONTRAST:  7.80m GADAVIST GADOBUTROL 1 MMOL/ML IV SOLN COMPARISON:  Prior brain MRI examinations 04/04/2022 and earlier. FINDINGS: Brain: No age advanced or lobar predominant parenchymal atrophy. Multiple enhancing intracranial lesions compatible with metastases, new from the prior brain MRI of 04/04/2022 and as follows. 13 mm lesion along the medial aspect of the mid right frontal lobe, abutting the falx (series 18, image 125). Mild parenchymal edema associated with this lesion. Adjacent 6 mm lesion along the mid right frontal lobe (series 18, image 126). 4 mm lesion along the mid right frontal lobe (series 18, image 109). 4 mm lesion along the anteromedial right frontal lobe, abutting the falx (series 18, image 108). 17 x 12 mm lesion along the right frontal operculum, abutting the overlying dura (series 18, image 91). Mild-to-moderate edema associated with this lesion. 7 mm lesion along the right insula (series 18, image 80). 3 mm lesion along the anterior right temporal lobe (series 18, image 51). 26 x 19 mm lesion within the right occipital lobe, abutting the overlying dura (series 18, image 64). Moderate surrounding vasogenic edema. 4 mm enhancing lesion along the high mid left frontal lobe (series 18, image 129). 4 mm lesion along the anteromedial left frontal lobe, abutting the falx (series 18, image 113). 6 mm lesion along the posterior left frontal lobe (series 18, image 103). 9 mm lesion along the left insula (series 18, image 84). 20 x 16 mm lesion  along the medial left temporal lobe, abutting the tentorium (series 18, image 62). Mild edema associated with this lesion. 2 mm lesion along the lateral left temporal lobe (series 18, image 64). 7 mm lesion along the left temporo-occipital lobes (series 18, image 59). 3 mm lesion along the posterolateral left temporal lobe (series 18, image 53). 12 mm lesion along the right superomedial aspect of the cerebellum, abutting the adjacent tentorium (series 18, image 57). 14 mm lesion within the inferior left cerebellar hemisphere (series 18, image 21). 6 mm lesion along the inferior left cerebellar hemisphere (series 18, image  14). Non-acute blood products associated with several of the above described lesions. Chronic microhemorrhage within the left cerebellar hemisphere. Background mild patchy and ill-defined hypoattenuation within the cerebral white matter, nonspecific but compatible with chronic small vessel image disease. There is no acute infarct. No extra-axial fluid collection. No midline shift. Vascular: Maintained flow voids within the proximal large arterial vessels. Skull and upper cervical spine: No focal suspicious marrow lesion. Sinuses/Orbits: No mass or acute finding within the imaged orbits. Small-to-moderate volume frothy secretions, and background mild mucosal thickening, within the left maxillary sinus. IMPRESSION: Numerous intracranial metastases, as described and new from the prior brain MRI of 04/04/2022. The largest lesions are located along the right frontal operculum (17 x 12 mm), within the right occipital lobe (26 x 19 mm) and along the medial left temporal lobe (20 x 16 mm). Edema associated with several lesions. Most notably, there is mild-to-moderate edema associated with the lesion along the right frontal operculum and moderate edema surrounding the lesion within the right occipital lobe. Additionally, there are non-acute blood products associated with several lesions. 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 Consider a total spine MRI with contrast for further evaluation. Mild chronic small vessel ischemic changes within the cerebral white matter. Left maxillary sinusitis. Electronically Signed   By: Kellie Simmering D.O.   On: 07/09/2022 14:55      IMPRESSION/PLAN:***    On date of service, in total, I spent *** minutes on this encounter. Patient was seen in person.   __________________________________________   Eppie Gibson, MD  This document serves as a record of services personally performed by Eppie Gibson, MD. It was created on her behalf by Roney Mans, a trained medical scribe. The creation of this record is based on the scribe's personal observations and the provider's statements to them. This document has been checked and approved by the attending provider.

## 2022-07-20 NOTE — Progress Notes (Signed)
LTM maint complete - no skin breakdown Serviced A2 [facility] monitored, Event button test confirmed by [facility].

## 2022-07-20 NOTE — Evaluation (Signed)
Physical Therapy Evaluation Patient Details Name: Pamela [name] MRN: [id number] DOB: 07-17-1961 Today's Date: 07/20/2022  History of Present Illness  61 y.o. female presents to [facility] hospital on 07/16/2022 after being found down at home with seizure-like activity, hyperglycemic to 300s. Pt required intubation on arrival to ED. CT Head with multiple hyperdense/hemorrhagic metastases (some known already). Extubated 11/2, further seizure-like activity 11/3. PMH includes HTN, DMII, hypothyroidism, SCLC with liver and brain mets.  Clinical Impression  Pt presents to PT with deficits in functional mobility, gait, balance, endurance, cognition. Pt with generalized weakness and increased sway when standing at edge of bed. Pt with balance vs motor planning deficits, demonstrating minimal step length despite verbal and visual cues to increase. PT will be better able to assess mobility once off EEG and able to ambulate for increased distances. PT currently recommends AIR admission.       Recommendations for follow up therapy are one component of a multi-disciplinary discharge planning process, led by the attending physician.  Recommendations may be updated based on patient status, additional functional criteria and insurance authorization.  Follow Up Recommendations Acute inpatient rehab (3hours/day)      Assistance Recommended at Discharge Intermittent Supervision/Assistance  Patient can return home with the following  A lot of help with walking and/or transfers;A lot of help with bathing/dressing/bathroom;Assistance with cooking/housework;Direct supervision/assist for medications management;Direct supervision/assist for financial management;Assist for transportation;Help with stairs or ramp for entrance    Equipment Recommendations Rolling walker (2 wheels)  Recommendations for Other Services  Rehab consult    Functional Status Assessment Patient has had a recent decline in their functional status and  demonstrates the ability to make significant improvements in function in a reasonable and predictable amount of time.     Precautions / Restrictions Precautions Precautions: Fall Precaution Comments: EEG Restrictions Weight Bearing Restrictions: No      Mobility  Bed Mobility Overal bed mobility: Needs Assistance Bed Mobility: Supine to Sit, Sit to Supine     Supine to sit: Supervision, HOB elevated Sit to supine: Min assist   General bed mobility comments: LE assistance    Transfers Overall transfer level: Needs assistance Equipment used: None Transfers: Sit to/from Stand Sit to Stand: Min guard                Ambulation/Gait Ambulation/Gait assistance: Min Web designer (Feet): 2 Feet Assistive device: None Gait Pattern/deviations: Step-to pattern Gait velocity: reduced Gait velocity interpretation: <1.31 ft/sec, indicative of household ambulator   General Gait Details: pt side steps to left and right at edge of bed, limited by EEG. Pt consistently demonstrates minimal step length in all directions, even with verbal and visual cues for larger step size  Stairs            Wheelchair Mobility    Modified Rankin (Stroke Patients Only)       Balance Overall balance assessment: Needs assistance Sitting-balance support: No upper extremity supported, Feet supported Sitting balance-Leahy Scale: Fair     Standing balance support: No upper extremity supported Standing balance-Leahy Scale: Poor Standing balance comment: minG-minA                             Pertinent Vitals/Pain Pain Assessment Pain Assessment: No/denies pain    Home Living Family/patient expects to be discharged to:: Private residence Living Arrangements: Spouse/significant other Available Help at Discharge: Family;Available 24 hours/day Type of Home: House Home Access: Stairs to enter Entrance  Stairs-Rails: Can reach both Entrance Stairs-Number of Steps:  4   Home Layout: One level;Laundry or work area in [employer]: Grab bars - toilet;Grab bars - tub/shower      Prior Function Prior Level of Function : Independent/Modified Independent;Driving                     Hand Dominance   Dominant Hand: Right    Extremity/Trunk Assessment   Upper Extremity Assessment Upper Extremity Assessment: Overall WFL for tasks assessed    Lower Extremity Assessment Lower Extremity Assessment: Generalized weakness    Cervical / Trunk Assessment Cervical / Trunk Assessment: Normal  Communication   Communication: No difficulties  Cognition Arousal/Alertness: Awake/alert Behavior During Therapy: WFL for tasks assessed/performed Overall Cognitive Status: Impaired/Different from baseline Area of Impairment: Problem solving, Memory, Orientation                 Orientation Level: Disoriented to, Time   Memory: Decreased short-term memory       Problem Solving: Slow processing          General Comments General comments (skin integrity, edema, etc.): VSS on RA    Exercises     Assessment/Plan    PT Assessment Patient needs continued PT services  PT Problem List Decreased strength;Decreased activity tolerance;Decreased balance;Decreased mobility;Decreased cognition;Decreased knowledge of use of DME;Decreased safety awareness;Decreased knowledge of precautions       PT Treatment Interventions DME instruction;Gait training;Stair training;Functional mobility training;Therapeutic activities;Therapeutic exercise;Balance training;Neuromuscular re-education;Cognitive remediation;Patient/family education    PT Goals (Current goals can be found in the Care Plan section)  Acute Rehab PT Goals Patient Stated Goal: to improve mobility quality once off EEG PT Goal Formulation: With patient Time For Goal Achievement: 08/03/22 Potential to Achieve Goals: Fair    Frequency Min 3X/week     Co-evaluation                AM-PAC PT "6 Clicks" Mobility  Outcome Measure Help needed turning from your back to your side while in a flat bed without using bedrails?: A Little Help needed moving from lying on your back to sitting on the side of a flat bed without using bedrails?: A Little Help needed moving to and from a bed to a chair (including a wheelchair)?: A Little Help needed standing up from a chair using your arms (e.g., wheelchair or bedside chair)?: A Little Help needed to walk in hospital room?: Total Help needed climbing 3-5 steps with a railing? : Total 6 Click Score: 14    End of Session   Activity Tolerance: Patient tolerated treatment well Patient left: in bed;with call bell/phone within reach Nurse Communication: Mobility status PT Visit Diagnosis: Other abnormalities of gait and mobility (R26.89);Muscle weakness (generalized) (M62.81);Other symptoms and signs involving the nervous system (R29.898)    Time: 1535-1601 PT Time Calculation (min) (ACUTE ONLY): 26 min   Charges:   PT Evaluation $PT Eval Low Complexity: [place], PT, DPT Acute Rehabilitation Office [phone]   Zenaida Niece 07/20/2022, 4:37 PM

## 2022-07-20 NOTE — Progress Notes (Signed)
Subjective: Reported a seizure around 0630. Per Dr. Karolee Stamps plan she was loaded with PHT. Continues on LTM.  Objective: Current vital signs: BP (!) 152/69   Pulse 72   Temp 98.1 F (36.7 C) (Oral)   Resp (!) 26   Ht 5\' 2"  (1.575 m)   Wt 76.3 kg   SpO2 96%   BMI 30.77 kg/m  Vital signs in last 24 hours: Temp:  [98.1 F (36.7 C)-98.7 F (37.1 C)] 98.1 F (36.7 C) (11/04 1200) Pulse Rate:  [63-87] 72 (11/04 1200) Resp:  [11-33] 26 (11/04 1200) BP: (131-162)/(62-81) 152/69 (11/04 1200) SpO2:  [91 %-100 %] 96 % (11/04 1200)  Intake/Output from previous day: 11/03 0701 - 11/04 0700 In: 559.8 [IV Piggyback:559.8] Out: 1600 [Urine:1600] Intake/Output this shift: Total I/O In: 370 [IV Piggyback:370] Out: -  Nutritional status:  Diet Order             DIET DYS 3 Room service appropriate? Yes with Assist; Fluid consistency: Thin  Diet effective now                   Neurologic Exam: Neuro: Awake with decreased level of alertness. Speech is sparse with no aphasia noted. Follows simple commands and answers basic questions. Moves all 4 extremities spontaneously.    Lab Results: Results for orders placed or performed during the hospital encounter of 07/16/22 (from the past 48 hour(s))  Glucose, capillary     Status: Abnormal   Collection Time: 07/18/22  4:06 PM  Result Value Ref Range   Glucose-Capillary 198 (H) 70 - 99 mg/dL    Comment: Glucose reference range applies only to samples taken after fasting for at least 8 hours.  Glucose, capillary     Status: Abnormal   Collection Time: 07/18/22  8:06 PM  Result Value Ref Range   Glucose-Capillary 281 (H) 70 - 99 mg/dL    Comment: Glucose reference range applies only to samples taken after fasting for at least 8 hours.  Glucose, capillary     Status: Abnormal   Collection Time: 07/18/22 11:12 PM  Result Value Ref Range   Glucose-Capillary 115 (H) 70 - 99 mg/dL    Comment: Glucose reference range applies only to samples  taken after fasting for at least 8 hours.  Glucose, capillary     Status: Abnormal   Collection Time: 07/19/22  3:09 AM  Result Value Ref Range   Glucose-Capillary 103 (H) 70 - 99 mg/dL    Comment: Glucose reference range applies only to samples taken after fasting for at least 8 hours.  CBC with Differential/Platelet     Status: Abnormal   Collection Time: 07/19/22  4:17 AM  Result Value Ref Range   WBC 11.1 (H) 4.0 - 10.5 K/uL   RBC 3.15 (L) 3.87 - 5.11 MIL/uL   Hemoglobin 10.2 (L) 12.0 - 15.0 g/dL   HCT 31.6 (L) 36.0 - 46.0 %   MCV 100.3 (H) 80.0 - 100.0 fL   MCH 32.4 26.0 - 34.0 pg   MCHC 32.3 30.0 - 36.0 g/dL   RDW 16.0 (H) 11.5 - 15.5 %   Platelets 101 (L) 150 - 400 K/uL    Comment: Immature Platelet Fraction may be clinically indicated, consider ordering this additional test [id number] REPEATED TO VERIFY    nRBC 0.0 0.0 - 0.2 %   Neutrophils Relative % 84 %   Neutro Abs 9.2 (H) 1.7 - 7.7 K/uL   Lymphocytes Relative 5 %   Lymphs  Abs 0.5 (L) 0.7 - 4.0 K/uL   Monocytes Relative 9 %   Monocytes Absolute 1.0 0.1 - 1.0 K/uL   Eosinophils Relative 0 %   Eosinophils Absolute 0.0 0.0 - 0.5 K/uL   Basophils Relative 0 %   Basophils Absolute 0.0 0.0 - 0.1 K/uL   Immature Granulocytes 2 %   Abs Immature Granulocytes 0.25 (H) 0.00 - 0.07 K/uL    Comment: Performed at [facility] [address], [place], [place] [address]  Comprehensive metabolic panel     Status: Abnormal   Collection Time: 07/19/22  4:17 AM  Result Value Ref Range   Sodium 140 135 - 145 mmol/L   Potassium 4.0 3.5 - 5.1 mmol/L   Chloride 105 98 - 111 mmol/L   CO2 27 22 - 32 mmol/L   Glucose, Bld 109 (H) 70 - 99 mg/dL    Comment: Glucose reference range applies only to samples taken after fasting for at least 8 hours.   BUN 13 8 - 23 mg/dL   Creatinine, Ser 1.00 0.44 - 1.00 mg/dL   Calcium 9.0 8.9 - 10.3 mg/dL   Total Protein 6.2 (L) 6.5 - 8.1 g/dL   Albumin 3.0 (L) 3.5 - 5.0 g/dL   AST 72 (H) 15 -  41 U/L   ALT 111 (H) 0 - 44 U/L   Alkaline Phosphatase 148 (H) 38 - 126 U/L   Total Bilirubin 0.7 0.3 - 1.2 mg/dL   GFR, Estimated >60 >60 mL/min    Comment: (NOTE) Calculated using the CKD-EPI Creatinine Equation (2021)    Anion gap 8 5 - 15    Comment: Performed at [facility], [place] [address] [place], [place] [address]  Lactate dehydrogenase     Status: Abnormal   Collection Time: 07/19/22  4:17 AM  Result Value Ref Range   LDH 261 (H) 98 - 192 U/L    Comment: Performed at [facility], [place] [address] [place], [place] [address]  Prealbumin     Status: None   Collection Time: 07/19/22  4:17 AM  Result Value Ref Range   Prealbumin 24 18 - 38 mg/dL    Comment: Performed at [facility] [address], [place], [place] [address]  Glucose, capillary     Status: Abnormal   Collection Time: 07/19/22  8:03 AM  Result Value Ref Range   Glucose-Capillary 190 (H) 70 - 99 mg/dL    Comment: Glucose reference range applies only to samples taken after fasting for at least 8 hours.  Glucose, capillary     Status: Abnormal   Collection Time: 07/19/22 11:46 AM  Result Value Ref Range   Glucose-Capillary 152 (H) 70 - 99 mg/dL    Comment: Glucose reference range applies only to samples taken after fasting for at least 8 hours.  Glucose, capillary     Status: None   Collection Time: 07/19/22  4:42 PM  Result Value Ref Range   Glucose-Capillary 88 70 - 99 mg/dL    Comment: Glucose reference range applies only to samples taken after fasting for at least 8 hours.  Glucose, capillary     Status: Abnormal   Collection Time: 07/19/22  7:32 PM  Result Value Ref Range   Glucose-Capillary 107 (H) 70 - 99 mg/dL    Comment: Glucose reference range applies only to samples taken after fasting for at least 8 hours.  Glucose, capillary     Status: Abnormal  Collection Time: 07/19/22 11:22 PM  Result Value Ref Range   Glucose-Capillary 128 (H) 70 - 99 mg/dL    Comment: Glucose  reference range applies only to samples taken after fasting for at least 8 hours.  Glucose, capillary     Status: Abnormal   Collection Time: 07/20/22  3:08 AM  Result Value Ref Range   Glucose-Capillary 133 (H) 70 - 99 mg/dL    Comment: Glucose reference range applies only to samples taken after fasting for at least 8 hours.  CBC with Differential/Platelet     Status: Abnormal   Collection Time: 07/20/22  6:20 AM  Result Value Ref Range   WBC 9.3 4.0 - 10.5 K/uL   RBC 2.99 (L) 3.87 - 5.11 MIL/uL   Hemoglobin 9.9 (L) 12.0 - 15.0 g/dL   HCT 29.4 (L) 36.0 - 46.0 %   MCV 98.3 80.0 - 100.0 fL   MCH 33.1 26.0 - 34.0 pg   MCHC 33.7 30.0 - 36.0 g/dL   RDW 15.8 (H) 11.5 - 15.5 %   Platelets 89 (L) 150 - 400 K/uL    Comment: SPECIMEN CHECKED FOR CLOTS Immature Platelet Fraction may be clinically indicated, consider ordering this additional test [id number] REPEATED TO VERIFY PLATELET COUNT CONFIRMED BY SMEAR    nRBC 0.0 0.0 - 0.2 %   Neutrophils Relative % 78 %   Neutro Abs 7.3 1.7 - 7.7 K/uL   Lymphocytes Relative 10 %   Lymphs Abs 1.0 0.7 - 4.0 K/uL   Monocytes Relative 10 %   Monocytes Absolute 0.9 0.1 - 1.0 K/uL   Eosinophils Relative 0 %   Eosinophils Absolute 0.0 0.0 - 0.5 K/uL   Basophils Relative 0 %   Basophils Absolute 0.0 0.0 - 0.1 K/uL   Immature Granulocytes 2 %   Abs Immature Granulocytes 0.15 (H) 0.00 - 0.07 K/uL    Comment: Performed at [facility], [address], [place], [place] [address]  Comprehensive metabolic panel     Status: Abnormal   Collection Time: 07/20/22  6:20 AM  Result Value Ref Range   Sodium 140 135 - 145 mmol/L   Potassium 3.8 3.5 - 5.1 mmol/L   Chloride 101 98 - 111 mmol/L   CO2 24 22 - 32 mmol/L   Glucose, Bld 94 70 - 99 mg/dL    Comment: Glucose reference range applies only to samples taken after fasting for at least 8 hours.   BUN 11 8 - 23 mg/dL   Creatinine, Ser 0.72 0.44 - 1.00 mg/dL   Calcium 9.2 8.9 - 10.3 mg/dL   Total  Protein 6.3 (L) 6.5 - 8.1 g/dL   Albumin 2.9 (L) 3.5 - 5.0 g/dL   AST 60 (H) 15 - 41 U/L   ALT 100 (H) 0 - 44 U/L   Alkaline Phosphatase 176 (H) 38 - 126 U/L   Total Bilirubin 0.8 0.3 - 1.2 mg/dL   GFR, Estimated >60 >60 mL/min    Comment: (NOTE) Calculated using the CKD-EPI Creatinine Equation (2021)    Anion gap 15 5 - 15    Comment: Performed at [facility], [place] [address] [place], [place] [address]  Glucose, capillary     Status: Abnormal   Collection Time: 07/20/22  7:27 AM  Result Value Ref Range   Glucose-Capillary 258 (H) 70 - 99 mg/dL    Comment: Glucose reference range applies only to samples taken after fasting for at least 8 hours.  Glucose, capillary  Status: Abnormal   Collection Time: 07/20/22  9:43 AM  Result Value Ref Range   Glucose-Capillary 286 (H) 70 - 99 mg/dL    Comment: Glucose reference range applies only to samples taken after fasting for at least 8 hours.  Glucose, capillary     Status: Abnormal   Collection Time: 07/20/22 11:36 AM  Result Value Ref Range   Glucose-Capillary 161 (H) 70 - 99 mg/dL    Comment: Glucose reference range applies only to samples taken after fasting for at least 8 hours.    Recent Results (from the past 240 hour(s))  SARS Coronavirus 2 by RT PCR (hospital order, performed in [facility] hospital lab) *cepheid single result test* Anterior Nasal Swab     Status: None   Collection Time: 07/16/22  1:06 AM   Specimen: Anterior Nasal Swab  Result Value Ref Range Status   SARS Coronavirus 2 by RT PCR NEGATIVE NEGATIVE Final    Comment: (NOTE) SARS-CoV-2 target nucleic acids are NOT DETECTED.  The SARS-CoV-2 RNA is generally detectable in upper and lower respiratory specimens during the acute phase of infection. The lowest concentration of SARS-CoV-2 viral copies this assay can detect is 250 copies / mL. A negative result does not preclude SARS-CoV-2 infection and should not be used as the sole basis for treatment or  other patient management decisions.  A negative result may occur with improper specimen collection / handling, submission of specimen other than nasopharyngeal swab, presence of viral mutation(s) within the areas targeted by this assay, and inadequate number of viral copies (<250 copies / mL). A negative result must be combined with clinical observations, patient history, and epidemiological information.  Fact Sheet for Patients:   https://www.patel.info/  Fact Sheet for Healthcare Providers: https://hall.com/  This test is not yet approved or  cleared by the [place] FDA and has been authorized for detection and/or diagnosis of SARS-CoV-2 by FDA under an Emergency Use Authorization (EUA).  This EUA will remain in effect (meaning this test can be used) for the duration of the COVID-19 declaration under Section 564(b)(1) of the Act, 21 U.S.C. section 360bbb-3(b)(1), unless the authorization is terminated or revoked sooner.  Performed at [facility], [address] [place], [place] [address]   Surgical PCR screen     Status: None   Collection Time: 07/16/22  5:57 AM   Specimen: Nasal Mucosa; Nasal Swab  Result Value Ref Range Status   MRSA, PCR NEGATIVE NEGATIVE Final   Staphylococcus aureus NEGATIVE NEGATIVE Final    Comment: (NOTE) The Xpert SA Assay (FDA approved for NASAL specimens in patients 23 years of age and older), is one component of a comprehensive surveillance program. It is not intended to diagnose infection nor to guide or monitor treatment. Performed at [facility], [place] [address], [place], [place] [address]     Lipid Panel No results for input(s): "CHOL", "TRIG", "HDL", "CHOLHDL", "VLDL", "LDLCALC" in the last 72 hours.  Studies/Results: CT HEAD WO CONTRAST (5MM)  Result Date: 07/19/2022 CLINICAL DATA:  Initial evaluation for altered mental status. History of metastatic small cell lung  cancer. EXAM: CT HEAD WITHOUT CONTRAST TECHNIQUE: Contiguous axial images were obtained from the base of the skull through the vertex without intravenous contrast. RADIATION DOSE REDUCTION: This exam was performed according to the departmental dose-optimization program which includes automated exposure control, adjustment of the mA and/or kV according to patient size and/or use of iterative reconstruction technique. COMPARISON:  Prior head CT from  07/16/2022. FINDINGS: Brain: Multiple scattered metastatic lesions again seen. The dominant lesion at the right occipital convexity is increased in size, now measuring 3.3 x 2.6 x 2.3 cm, previously 2.7 x 2.4 x 1.8 cm when measured in a similar fashion. Additional right frontal parafalcine lesion also increased in size now measuring up to 1.5 cm, previously 1 cm. Associated hyperdensity consistent with hemorrhage/blood products. Multifocal areas of vasogenic edema again seen, worsened within the left parietal region, suggesting progressive disease (series 3, image 23). No midline shift. Basilar cisterns remain patent. No other acute intracranial hemorrhage or large vessel territory infarct. No hydrocephalus or extra-axial fluid collection. Vascular: No hyperdense vessel. Skull: Scalp soft tissues and calvarium demonstrate no acute finding. Sinuses/Orbits: Globes orbital soft tissues demonstrate no acute finding. Air-fluid level with pneumatized secretions noted within the left maxillary sinus. No mastoid effusion. Other: None. IMPRESSION: 1. Interval increase in size of multiple scattered metastatic lesions as above, consistent with progressive disease. Scattered areas of vasogenic edema appear slightly progressed and worsened as well, most notable at the left parietal region. No midline shift. 2. No other acute intracranial abnormality. 3. Air-fluid level with pneumatized secretions within the left maxillary sinus, suggesting acute sinusitis. Electronically Signed   By:  Jeannine Boga M.D.   On: 07/19/2022 23:18    Medications: Prior to Admission:  Medications Prior to Admission  Medication Sig Dispense Refill Last Dose   acetaminophen (TYLENOL) 500 MG tablet Take 1,000 mg by mouth every 6 (six) hours as needed for headache or moderate pain.   Past Week   albuterol (VENTOLIN HFA) 108 (90 Base) MCG/ACT inhaler Inhale 2 puffs into the lungs as needed for wheezing or shortness of breath.   unk   dexamethasone (DECADRON) 4 MG tablet Take 2 tablets (8 mg total) by mouth daily. Start the day after chemotherapy for 2 days. Take with food. (Patient taking differently: Take 8 mg by mouth 3 (three) times daily.) 30 tablet 1 07/15/2022   famotidine (PEPCID) 40 MG tablet Take 1 tablet (40 mg total) by mouth 2 (two) times daily. 60 tablet 1 07/15/2022   fluconazole (DIFLUCAN) 100 MG tablet Take 1 tablet (100 mg total) by mouth daily. 30 tablet 1 07/15/2022   ibuprofen (ADVIL) 200 MG tablet Take 400 mg by mouth every 6 (six) hours as needed for headache (pain).   Past Week   levothyroxine (SYNTHROID) 125 MCG tablet TAKE 1 TABLET(125 MCG) BY MOUTH DAILY BEFORE BREAKFAST (Patient taking differently: Take 125 mcg by mouth daily before breakfast.) 90 tablet 3 07/15/2022   lidocaine-prilocaine (EMLA) cream Apply to affected area once (Patient taking differently: Apply 1 Application topically as needed (for port access).) 30 g 3 Past Month   Multiple Vitamin (MULTIVITAMIN) tablet Take 1 tablet by mouth daily.   07/15/2022   naloxone (NARCAN) nasal spray 4 mg/0.1 mL Place 0.4 mg into the nose as needed (overdose).   never   ondansetron (ZOFRAN) 8 MG tablet Take 1 tablet (8 mg total) by mouth every 8 (eight) hours as needed for nausea or vomiting. Start on the third day after chemotherapy. (Patient taking differently: Take 8 mg by mouth as needed for nausea or vomiting.) 30 tablet 1 Past Month   potassium chloride SA (KLOR-CON M) 20 MEQ tablet TAKE 2 TABLETS BY MOUTH TWICE DAILY  120 tablet 2 07/15/2022   prochlorperazine (COMPAZINE) 10 MG tablet Take 1 tablet (10 mg total) by mouth every 6 (six) hours as needed for nausea or vomiting. (Patient taking  differently: Take 10 mg by mouth as needed for nausea or vomiting.) 30 tablet 1 Past Month   simvastatin (ZOCOR) 40 MG tablet Take 40 mg by mouth daily.   07/15/2022   sitaGLIPtin-metformin (JANUMET) 50-500 MG tablet Take 1 tablet by mouth 2 (two) times daily with a meal.   07/15/2022   ciprofloxacin (CIPRO) 500 MG tablet Take 1 tablet (500 mg total) by mouth daily with breakfast. (Patient not taking: Reported on 05/27/2022) 15 tablet 4 Completed Course   esomeprazole (NEXIUM) 20 MG capsule Take 20 mg by mouth daily at 12 noon. (Patient not taking: Reported on 07/16/2022)   Not Taking   fentaNYL (DURAGESIC) 12 MCG/HR Place 1 patch onto the skin every 3 (three) days. (Patient not taking: Reported on 07/16/2022) 5 patch 0 Not Taking   furosemide (LASIX) 40 MG tablet Take 1 tablet (40 mg total) by mouth daily. (Patient not taking: Reported on 07/16/2022) 30 tablet 11 Not Taking   Oxycodone HCl 10 MG TABS Take 1 tablet (10 mg total) by mouth every 3 (three) hours as needed (breakthrough pain). (Patient not taking: Reported on 07/16/2022) 90 tablet 0 Not Taking   senna-docusate (SENOKOT-S) 8.6-50 MG tablet Take 1 tablet by mouth 2 (two) times daily. (Patient not taking: Reported on 07/16/2022)   Not Taking   Scheduled:  Chlorhexidine Gluconate Cloth  6 each Topical q morning   dexamethasone (DECADRON) injection  4 mg Intravenous Q6H   docusate sodium  100 mg Oral BID   insulin aspart  0-20 Units Subcutaneous TID with meals   insulin aspart  0-5 Units Subcutaneous QHS   levothyroxine  125 mcg Oral Q0600   pantoprazole  40 mg Oral QHS   polyethylene glycol  17 g Oral Daily   Continuous:  dexmedetomidine (PRECEDEX) IV infusion Stopped (07/18/22 0846)   lacosamide (VIMPAT) IV 90 mL/hr at 07/20/22 1100   levETIRAcetam Stopped  (07/20/22 1004)    ASSESSMENT: 61 year old female with 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 -Seizure activity recurred this morning (Saturday) and PHT was loaded -New onset seizure with status epilepticus at presentation was most likely due to metastatic brain disease as well as hyperglycemia - LTM EEG report for today AM: Pending   Recommendations: -Continue Keppra at 1000 mg twice daily. -Continue Vimpat 200 mg twice daily -Phenytoin 100 mg IV TID has been ordered -Dr. Hortense Ramal has discussed with family that if she has status epilepticus again, may need to reintubate.  Family understands and is in agreement -Continue LTM EEG  -Goal is to stop seizures and get patient to [facility] for radiation for the brain mets hopefully by Monday -Continue seizure precautions -As needed IV Ativan 2 mg for clinical seizure-like activity -Management of rest of the comorbidities per primary team  Addendum: LTM EEG report, 07/20/2022 0523 to 07/21/2022 0700: This study showed evidence of epileptogenicity and cortical dysfunction arising from right temporoparietal region likely secondary to underlying mass. Additionally there was mild to moderate diffuse encephalopathy, nonspecific with regard to etiology. No seizures were seen during this study.   LOS: 4 days   @Electronically  signed: Dr. Kerney Elbe 07/20/2022  12:49 PM

## 2022-07-21 ENCOUNTER — Other Ambulatory Visit: Payer: Self-pay

## 2022-07-21 DIAGNOSIS — Z515 Encounter for palliative care: Secondary | ICD-10-CM | POA: Diagnosis not present

## 2022-07-21 DIAGNOSIS — G40901 Epilepsy, unspecified, not intractable, with status epilepticus: Secondary | ICD-10-CM | POA: Diagnosis not present

## 2022-07-21 LAB — CBC WITH DIFFERENTIAL/PLATELET
Abs Immature Granulocytes: 0.25 10*3/uL — ABNORMAL HIGH (ref 0.00–0.07)
Basophils Absolute: 0 10*3/uL (ref 0.0–0.1)
Basophils Relative: 0 %
Eosinophils Absolute: 0 10*3/uL (ref 0.0–0.5)
Eosinophils Relative: 0 %
HCT: 30.9 % — ABNORMAL LOW (ref 36.0–46.0)
Hemoglobin: 10.5 g/dL — ABNORMAL LOW (ref 12.0–15.0)
Immature Granulocytes: 3 %
Lymphocytes Relative: 7 %
Lymphs Abs: 0.7 10*3/uL (ref 0.7–4.0)
MCH: 33.5 pg (ref 26.0–34.0)
MCHC: 34 g/dL (ref 30.0–36.0)
MCV: 98.7 fL (ref 80.0–100.0)
Monocytes Absolute: 0.9 10*3/uL (ref 0.1–1.0)
Monocytes Relative: 10 %
Neutro Abs: 7.5 10*3/uL (ref 1.7–7.7)
Neutrophils Relative %: 80 %
Platelets: 87 10*3/uL — ABNORMAL LOW (ref 150–400)
RBC: 3.13 MIL/uL — ABNORMAL LOW (ref 3.87–5.11)
RDW: 15.6 % — ABNORMAL HIGH (ref 11.5–15.5)
WBC: 9.3 10*3/uL (ref 4.0–10.5)
nRBC: 0 % (ref 0.0–0.2)

## 2022-07-21 LAB — COMPREHENSIVE METABOLIC PANEL
ALT: 95 U/L — ABNORMAL HIGH (ref 0–44)
AST: 55 U/L — ABNORMAL HIGH (ref 15–41)
Albumin: 2.8 g/dL — ABNORMAL LOW (ref 3.5–5.0)
Alkaline Phosphatase: 182 U/L — ABNORMAL HIGH (ref 38–126)
Anion gap: 11 (ref 5–15)
BUN: 9 mg/dL (ref 8–23)
CO2: 24 mmol/L (ref 22–32)
Calcium: 8.6 mg/dL — ABNORMAL LOW (ref 8.9–10.3)
Chloride: 99 mmol/L (ref 98–111)
Creatinine, Ser: 0.78 mg/dL (ref 0.44–1.00)
GFR, Estimated: >60 mL/min (ref >60)
Glucose, Bld: 146 mg/dL — ABNORMAL HIGH (ref 70–99)
Potassium: 3.5 mmol/L (ref 3.5–5.1)
Sodium: 134 mmol/L — ABNORMAL LOW (ref 135–145)
Total Bilirubin: 0.8 mg/dL (ref 0.3–1.2)
Total Protein: 6.3 g/dL — ABNORMAL LOW (ref 6.5–8.1)

## 2022-07-21 LAB — GLUCOSE, CAPILLARY
Glucose-Capillary: 193 mg/dL — ABNORMAL HIGH (ref 70–99)
Glucose-Capillary: 249 mg/dL — ABNORMAL HIGH (ref 70–99)
Glucose-Capillary: 197 mg/dL — ABNORMAL HIGH (ref 70–99)
Glucose-Capillary: 278 mg/dL — ABNORMAL HIGH (ref 70–99)

## 2022-07-21 MED ORDER — POTASSIUM CHLORIDE 10 MEQ/100ML IV SOLN
10.0000 meq | INTRAVENOUS | Status: AC
  Administered 2022-07-21 (×4): 10 meq via INTRAVENOUS
  Filled 2022-07-21 (×4): qty 100

## 2022-07-21 NOTE — Progress Notes (Signed)
[facility] ADULT ICU REPLACEMENT PROTOCOL   The patient does apply for the [facility] Adult ICU Electrolyte Replacment Protocol based on the criteria listed below:   1.Exclusion criteria: TCTS, ECMO, Dialysis, and Myasthenia Gravis patients 2. Is GFR >/= 30 ml/min? Yes.    Patient's GFR today is >60 3. Is SCr </= 2? Yes.   Patient's SCr is 0.78 mg/dL 4. Did SCr increase >/= 0.5 in 24 hours? No. 5.Pt's weight >40kg  Yes.   6. Abnormal electrolyte(s): K  7. Electrolytes replaced per protocol 8.  Call MD STAT for K+ </= 2.5, Phos </= 1, or Mag </= 1 Physician:  Jeannene Patella [facility] 07/21/2022 6:05 AM

## 2022-07-21 NOTE — Procedures (Signed)
Patient Name: Pamela [name]  MRN: [id number]  Epilepsy Attending: Lora Havens  Referring Physician/Provider: Amie Portland, MD  Duration: 07/18/2022 0523 to 07/18/2022 0511   Patient history: 61 year old female with non-small cell lung cancer with brain mets presented with seizures. EEG to eval for status epilepticus   Level of alertness: lethargic, asleep   AEDs during EEG study:  LEV, LCM   Technical aspects: This EEG study was done with scalp electrodes positioned according to the 10-20 International system of electrode placement. Electrical activity was reviewed with band pass filter of 1-70Hz , sensitivity of 7 uV/mm, display speed of 60mm/sec with a 60Hz  notched filter applied as appropriate. EEG data were recorded continuously and digitally stored.  Video monitoring was available and reviewed as appropriate.   Description: During awake state, no clear posterior dominant rhythm was seen. Sleep was characterized by sleep spindles (12-14hz ), maximal fronto-central region. EEG showed continuous generalized 3 to 7 Hz theta and delta slowing.  Polyspikes were noted in left frontal region.  Sharp waves are also noted in right temporoparietal region. Hyperventilation and photic stimulation were not performed.       ABNORMALITY - Polyspikes, left frontal region - Sharp wave, right temporoparietal region - Continuous slow, generalized   IMPRESSION: This study showed evidence of independent epileptogenicity arising from left frontal region and right temporoparietal region.  Additionally there is moderate to severe diffuse encephalopathy, nonspecific etiology. No seizures were seen throughout the recording.   Gatlin Kittell Barbra Sarks

## 2022-07-21 NOTE — Progress Notes (Signed)
NAME:  Pamela [name], MRN:  [id number], DOB:  [birth year], LOS: 5 ADMISSION DATE:  07/16/2022 CONSULTATION DATE:  07/16/2022 REFERRING MD:  [facility] CHIEF COMPLAINT:  Seizures   History of Present Illness:  61 year old woman who presented to [facility] ED 10/30 via EMS after being found down at home with seizure-like activity. LKW 2040, found around 2245 having L-sided partial seizures and hyperglycemic to 300s. Recent transient spontaenously-resolving episodes of slurred speech at home per family. PMHx significant for HTN, T2DM, hypothyroidism, SCLC (s/p chemotherapy, followed by Dr. Marin Olp) with known liver metastasis and recently diagnosed brain metastasis with plan for whole brain radiation (not yet started).   On EMS arrival, patient was reportedly mostly unresponsive with ongoing L-sided partial seizure activity. Versed IV (25m total) administered without cessation of seizure. Labs were notable for WBC 11.6, H&H/Plt WNL, hyponatremia with Na 126, K 6.2, serum glucose 836, BUN 49, Cr 1.49 (baseline 0.9). LFTs elevated with AST/ALT 76/110, Alk Phos 226. INR WNL. UA with > 500 glucose; UDS +BZD (s/p Ativan/Versed). Ethanol negative. [facility]; Propofol gtt + Versed initiated. Neuro consulted; rapid EEG completed. Keppra load given + fosphenytoin. CT Head with multiple hyperdense/hemorrhagic metastases (largest 2.6cm, unchanged) with edema of R frontal and parietal/occipital lobes. CT C-spine negative.   [facility]M to [facility].  Pertinent Medical History:   Past Medical History:  Diagnosis Date   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 Intubated on arrival to ED for airway protection. CT Head with known hemorrhagic brain mets. Hyperglycemic to 600s. [facility]  11/1 episodes of jerking seen without EEG correlate. Extubated 11/2, LTM discontinued   Interim History / Subjective:  Focal convulsive status yesterday resolving at noon with rare seizure activity afterward. Otherwise no acute issues.  Objective:  Blood pressure (!) 156/72, pulse 67, temperature 98.2 F (36.8 C), temperature source Oral, resp. rate (!) 24, height _0  (1.575 m), weight 76.3 kg, SpO2 94 %.        Intake/Output Summary (Last 24 hours) at 07/21/2022 0715 Last data filed at 07/20/2022 1200 Gross per 24 hour  Intake 395.2 ml  Output --  Net 395.2 ml   Filed Weights   07/17/22 0300 07/21/22 0500  Weight: 76.3 kg 76.3 kg    Physical Examination: General appearance: 61y.o., female, eating breakfast Eyes: tracks Lungs: ctab, with normal respiratory effort CV: RRR, no murmur  Abdomen: Soft, non-tender; non-distended, BS present Extremities: no peripheral edema, warm Neuro: moves all ext spont, ffollows commands, answers questions appropriately  Labs/imaging reviewed Na 134, mild elevation in AST/ALT - trending down,  PLT 87 stable   Resolved issues: Acute hypoxic respiratory failure - intubated in setting of status epilepticus  Assessment & Plan:   Status epilepticus ([place] 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 Antonieta Pert input  SCLC (small cell lung carcinoma) ([place]) Diagnosed 8 months ago at stage IV- Limited pathway forward. Needs to recover from status epilepticus rapidly for her to have a realistic chance to get back to treatment.   Thrombocytopenia 4Ts score low probability for HIT - will nonetheless hold sq heparin in setting of bleeding CNS metastases   Best Practice: (right click and "Reselect all SmartList Selections" daily)   Diet/type: NPO DVT prophylaxis: SCD GI prophylaxis: PPI Lines: Port, R upper chest Foley:  Yes, and it is still needed Code Status:  full code Last date of multidisciplinary goals of care discussion [family updated 11/3, palliative care consutled. Difficult scenario, would still want to pursue elective intubation for more aggressive seizure control if reloading AEDs unsuccessful]

## 2022-07-21 NOTE — Progress Notes (Signed)
   Palliative Medicine Inpatient Follow Up Note HPI:  61 year old woman PMHx significant for HTN, T2DM, hypothyroidism, SCLC (s/p chemotherapy, followed by Dr. Marin Olp) with known liver metastasis and recently diagnosed brain metastasis with plan for whole brain radiation (not yet started). Found to be in status epilepticus (cerebral metastases from [facility] ). Palliative care has been asked to get involved for further [place] conversation in the setting of ongoing seizure activirty.   Today's Discussion 07/21/2022  *Please note that this is a verbal dictation therefore any spelling or grammatical errors are due to the "Stre[employer] One" system interpretation.  Chart reviewed inclusive of vital signs, progress notes, laboratory results, and diagnostic images.   I met with Pamela [name] at bedside this morning, she was joined by her son, his spouse and two grandchildren. We discussed that she has not have a seizure in the past 24 hours. She expresses that the plan is for transfer to [facility] later today and to start radiation tomorrow. Patients family express hope that this will help her to have more good days.   Denied pain, nausea, SOB.  Confirmed full code/full scope of medical care.   Questions and concerns addressed/Palliative Support Provided.   Objective Assessment: Vital Signs Vitals:   07/21/22 0800 07/21/22 0900  BP: (!) 146/70 (!) 132/52  Pulse: 76 81  Resp: 17 19  Temp: 97.6 F (36.4 C)   SpO2: 97% 95%    Intake/Output Summary (Last 24 hours) at 07/21/2022 1019 Last data filed at 07/21/2022 0800 Gross per 24 hour  Intake 216.49 ml  Output 700 ml  Net -483.51 ml    Last Weight  Most recent update: 07/21/2022  6:37 AM    Weight  76.3 kg (168 lb 3.4 oz)            Gen: Older caucasian F - chronically ill appearing HEENT: dry mucous membranes CV: Regular rate and rhythm  PULM: ON RA, breathing is even and nonlabored ABD: soft/nontender  EXT: Mild BLE edema  Neuro: Alert and  oriented x3 - conversant this morning, speech clearer  SUMMARY OF RECOMMENDATIONS   FULL CODE / Full scope of care  inclusive of re-intubation if needed   Plan to go to [facility] for brain radiation which will start on Monday   Ongoing PMT support  Billing based on MDM: High ______________________________________________________________________________________ [place] Team Team Cell Phone: [phone] Please utilize secure chat with additional questions, if there is no response within 30 minutes please call the above phone number  Palliative Medicine Team providers are available by phone from 7am to 7pm daily and can be reached through the team cell phone.  Should this patient require assistance outside of these hours, please call the patient's attending physician.

## 2022-07-21 NOTE — Progress Notes (Signed)
[facility] at bedside to take patient to [facility]. Report called to [facility] RN at 1620 by this RN. Report given to [facility] RN prior to transfer.

## 2022-07-21 NOTE — Progress Notes (Signed)
LTM EEG disconnected - no skin breakdown at [facility].

## 2022-07-22 ENCOUNTER — Encounter (HOSPITAL_COMMUNITY): Payer: Self-pay | Admitting: Critical Care Medicine

## 2022-07-22 ENCOUNTER — Other Ambulatory Visit: Payer: Self-pay

## 2022-07-22 ENCOUNTER — Institutional Professional Consult (permissible substitution): Payer: BC Managed Care – PPO | Admitting: Radiation Oncology

## 2022-07-22 ENCOUNTER — Inpatient Hospital Stay: Payer: BC Managed Care – PPO | Attending: Oncology

## 2022-07-22 ENCOUNTER — Ambulatory Visit
Admit: 2022-07-22 | Discharge: 2022-07-22 | Disposition: A | Discharge status: Home / Self Care | Payer: BC Managed Care – PPO | Attending: Radiation Oncology | Admitting: Radiation Oncology

## 2022-07-22 ENCOUNTER — Ambulatory Visit: Payer: BC Managed Care – PPO

## 2022-07-22 DIAGNOSIS — G40901 Epilepsy, unspecified, not intractable, with status epilepticus: Secondary | ICD-10-CM | POA: Diagnosis not present

## 2022-07-22 DIAGNOSIS — C7931 Secondary malignant neoplasm of brain: Secondary | ICD-10-CM

## 2022-07-22 LAB — RAD ONC ARIA SESSION SUMMARY
Course Elapsed Days: 0
Plan Fractions Treated to Date: 1
Plan Prescribed Dose Per Fraction: 3 Gy
Plan Total Fractions Prescribed: 10
Plan Total Prescribed Dose: 30 Gy
Reference Point Dosage Given to Date: 3 Gy
Reference Point Session Dosage Given: 3 Gy
Session Number: 1

## 2022-07-22 LAB — COMPREHENSIVE METABOLIC PANEL
ALT: 82 U/L — ABNORMAL HIGH (ref 0–44)
AST: 44 U/L — ABNORMAL HIGH (ref 15–41)
Albumin: 3 g/dL — ABNORMAL LOW (ref 3.5–5.0)
Alkaline Phosphatase: 211 U/L — ABNORMAL HIGH (ref 38–126)
Anion gap: 5 (ref 5–15)
BUN: 13 mg/dL (ref 8–23)
CO2: 26 mmol/L (ref 22–32)
Calcium: 8.5 mg/dL — ABNORMAL LOW (ref 8.9–10.3)
Chloride: 104 mmol/L (ref 98–111)
Creatinine, Ser: 0.72 mg/dL (ref 0.44–1.00)
GFR, Estimated: >60 mL/min (ref >60)
Glucose, Bld: 169 mg/dL — ABNORMAL HIGH (ref 70–99)
Potassium: 3.6 mmol/L (ref 3.5–5.1)
Sodium: 135 mmol/L (ref 135–145)
Total Bilirubin: 0.7 mg/dL (ref 0.3–1.2)
Total Protein: 6.6 g/dL (ref 6.5–8.1)

## 2022-07-22 LAB — CBC WITH DIFFERENTIAL/PLATELET
Abs Immature Granulocytes: 0.36 10*3/uL — ABNORMAL HIGH (ref 0.00–0.07)
Basophils Absolute: 0 10*3/uL (ref 0.0–0.1)
Basophils Relative: 0 %
Eosinophils Absolute: 0 10*3/uL (ref 0.0–0.5)
Eosinophils Relative: 0 %
HCT: 30.6 % — ABNORMAL LOW (ref 36.0–46.0)
Hemoglobin: 9.9 g/dL — ABNORMAL LOW (ref 12.0–15.0)
Immature Granulocytes: 4 %
Lymphocytes Relative: 9 %
Lymphs Abs: 0.8 10*3/uL (ref 0.7–4.0)
MCH: 32.8 pg (ref 26.0–34.0)
MCHC: 32.4 g/dL (ref 30.0–36.0)
MCV: 101.3 fL — ABNORMAL HIGH (ref 80.0–100.0)
Monocytes Absolute: 1 10*3/uL (ref 0.1–1.0)
Monocytes Relative: 10 %
Neutro Abs: 7.5 10*3/uL (ref 1.7–7.7)
Neutrophils Relative %: 77 %
Platelets: 96 10*3/uL — ABNORMAL LOW (ref 150–400)
RBC: 3.02 MIL/uL — ABNORMAL LOW (ref 3.87–5.11)
RDW: 15.6 % — ABNORMAL HIGH (ref 11.5–15.5)
WBC: 9.7 10*3/uL (ref 4.0–10.5)
nRBC: 0 % (ref 0.0–0.2)

## 2022-07-22 LAB — GLUCOSE, CAPILLARY
Glucose-Capillary: 157 mg/dL — ABNORMAL HIGH (ref 70–99)
Glucose-Capillary: 217 mg/dL — ABNORMAL HIGH (ref 70–99)
Glucose-Capillary: 213 mg/dL — ABNORMAL HIGH (ref 70–99)
Glucose-Capillary: 271 mg/dL — ABNORMAL HIGH (ref 70–99)

## 2022-07-22 LAB — PHENYTOIN LEVEL, TOTAL: Phenytoin Lvl: 16.4 ug/mL (ref 10.0–20.0)

## 2022-07-22 MED ORDER — INSULIN GLARGINE-YFGN 100 UNIT/ML ~~LOC~~ SOLN
5.0000 [IU] | Freq: Every day | SUBCUTANEOUS | Status: DC
  Administered 2022-07-22 – 2022-08-02 (×12): 5 [IU] via SUBCUTANEOUS
  Filled 2022-07-22 (×12): qty 0.05

## 2022-07-22 NOTE — Progress Notes (Signed)
Physical Therapy Treatment Patient Details Name: Pamela [name] MRN: [id number] DOB: [birth year] Today's Date: 07/22/2022   History of Present Illness 61 y.o. female presents to [facility] hospital on 07/16/2022 after being found down at home with seizure-like activity, hyperglycemic to 300s. Pt required intubation on arrival to ED. CT Head with multiple hyperdense/hemorrhagic metastases (some known already). Extubated 11/2, further seizure-like activity 11/3. PMH includes HTN, DMII, hypothyroidism, SCLC with liver and brain mets.    PT Comments    Pt assisted with ambulating short distance in hallway and requiring mod assist for weakness and stability.  Pt waiting for radiation transport which arrived after ambulating.  AIR d/c recommendation remains appropriate.    Recommendations for follow up therapy are one component of a multi-disciplinary discharge planning process, led by the attending physician.  Recommendations may be updated based on patient status, additional functional criteria and insurance authorization.  Follow Up Recommendations  Acute inpatient rehab (3hours/day)     Assistance Recommended at Discharge Intermittent Supervision/Assistance  Patient can return home with the following A lot of help with walking and/or transfers;A lot of help with bathing/dressing/bathroom;Assistance with cooking/housework;Direct supervision/assist for medications management;Direct supervision/assist for financial management;Assist for transportation;Help with stairs or ramp for entrance   Equipment Recommendations  Rolling walker (2 wheels)    Recommendations for Other Services       Precautions / Restrictions Precautions Precautions: Fall     Mobility  Bed Mobility               General bed mobility comments: pt in recliner    Transfers Overall transfer level: Needs assistance Equipment used: None Transfers: Sit to/from Stand Sit to Stand: Min guard           General transfer  comment: min/guard for safety, reliant on UE assist    Ambulation/Gait Ambulation/Gait assistance: Min assist, Mod assist Gait Distance (Feet): 56 Feet Assistive device: 2 person hand held assist Gait Pattern/deviations: Step-to pattern, Decreased stance time - left       General Gait Details: pt leads with right LE and decreased stance time on left LE, pt reports right leg feels stronger; with increased distance and fatigue, pt required more stabilizing assist; cued for standing rest break also to recenter; pt requires cues to look upright (tends to look at the floor/feet); pt declined RW so utilized bil UE s upport with granddaughter on pt's other side; discussed pt needing to use RW now due to weakness, instability and for overall safety   Stairs             Wheelchair Mobility    Modified Rankin (Stroke Patients Only)       Balance Overall balance assessment: Needs assistance         Standing balance support: Single extremity supported Standing balance-Leahy Scale: Poor Standing balance comment: reliant on UE support for static stability                            Cognition Arousal/Alertness: Awake/alert Behavior During Therapy: WFL for tasks assessed/performed Overall Cognitive Status: Impaired/Different from baseline Area of Impairment: Safety/judgement, Problem solving                         Safety/Judgement: Decreased awareness of safety, Decreased awareness of deficits   Problem Solving: Slow processing, Requires verbal cues          Exercises      General  Comments        Pertinent Vitals/Pain Pain Assessment Pain Assessment: No/denies pain    Home Living                          Prior Function            PT Goals (current goals can now be found in the care plan section) Progress towards PT goals: Progressing toward goals    Frequency    Min 3X/week      PT Plan Current plan remains  appropriate    Co-evaluation              AM-PAC PT "6 Clicks" Mobility   Outcome Measure  Help needed turning from your back to your side while in a flat bed without using bedrails?: A Little Help needed moving from lying on your back to sitting on the side of a flat bed without using bedrails?: A Little Help needed moving to and from a bed to a chair (including a wheelchair)?: A Little Help needed standing up from a chair using your arms (e.g., wheelchair or bedside chair)?: A Little Help needed to walk in hospital room?: A Lot Help needed climbing 3-5 steps with a railing? : Total 6 Click Score: 15    End of Session Equipment Utilized During Treatment: Gait belt Activity Tolerance: Patient tolerated treatment well Patient left: in chair;with call bell/phone within reach;with family/visitor present Nurse Communication: Mobility status PT Visit Diagnosis: Other abnormalities of gait and mobility (R26.89)     Time: 1448-1856 PT Time Calculation (min) (ACUTE ONLY): 11 min  Charges:  $Gait Training: 8-22 mins                     Arlyce Dice, DPT Physical Therapist Acute Rehabilitation Services Preferred contact method: Secure Chat Weekend Pager Only: [phone] Office: [phone]    Myrtis Hopping [place] 07/22/2022, 4:01 PM

## 2022-07-22 NOTE — Progress Notes (Signed)
Pamela [name] is now over [facility].  She had moved over yesterday.  She really looks great.  Her mental status is pretty much back to her baseline.  There were no seizures over the weekend.  Hopefully, she can start radiation therapy to the brain today.  She does need quite a bit of physical therapy.  She says she has a decent appetite.  Hopefully, she will to eat well well over at [facility].  She has had no bleeding.  There is been no nausea or vomiting.  There is been no bowel or bladder incontinence.  Her labs show white count 9.7.  Hemoglobin 9.9.  Platelet count 96,000.  Her BUN is 13 creatinine 0.72.  Calcium is 8.5 with an albumin of 3.0.  SGPT is 82 SGOT 44.  Bilirubin is 0.7.  I am just amazed as to her resilience.  She really has come back nicely.  Again she seems to be close to her baseline mental status right now.  Vital signs significant 97.3.  Pulse 83.  Blood pressure 154/70.  Oxygen saturation on room air is 98%.  Her lungs sound pretty clear bilaterally.  She has decent breath sounds bilaterally.  Cardiac exam regular rate and rhythm.  Abdomen is soft.  Bowel sounds are present.  She has no fluid wave.  Extremities shows no clubbing, cyanosis or edema.  There may be a little bit of swelling in the lower legs around the ankles which is chronic.  She has decent strength in upper and lower extremities.  Neurological exam shows no focal neurological deficits.  Ms. Spatz has progressive small cell lung cancer.  She has CNS metastasis now.  I suspect this is probably the source of her seizures.  Her blood sugars are much better now.  This will help with respect to seizure control.  I will have to let Radiation Oncology know if she is over at [facility] long now.  She has a Port-A-Cath in.  I wrote for this to be accessed for 5 days ago.  This is not yet accessed.  I will have to write another order for this to be accessed.  I know she will get incredible care from the  wonderful staff upon [address].  I know that they are so compassionate and will do a wonderful job.  [name], MD  Jenny Reichmann  6:29

## 2022-07-22 NOTE — Progress Notes (Signed)
NAME:  Pamela [name], MRN:  [id number], DOB:  February 22, 1961, LOS: 6 ADMISSION DATE:  07/16/2022 CONSULTATION DATE:  07/16/2022 REFERRING MD:  [facility] CHIEF COMPLAINT:  Seizures   History of Present Illness:  61 year old woman who presented to [facility] ED 10/30 via EMS after being found down at home with seizure-like activity. LKW 2040, found around 2245 having L-sided partial seizures and hyperglycemic to 300s. Recent transient spontaenously-resolving episodes of slurred speech at home per family. PMHx significant for HTN, T2DM, hypothyroidism, SCLC (s/p chemotherapy, followed by Dr. Marin Olp) with known liver metastasis and recently diagnosed brain metastasis with plan for whole brain radiation (not yet started).   On EMS arrival, patient was reportedly mostly unresponsive with ongoing L-sided partial seizure activity. Versed IV (61m total) administered without cessation of seizure. Labs were notable for WBC 11.6, H&H/Plt WNL, hyponatremia with Na 126, K 6.2, serum glucose 836, BUN 49, Cr 1.49 (baseline 0.9). LFTs elevated with AST/ALT 76/110, Alk Phos 226. INR WNL. UA with > 500 glucose; UDS +BZD (s/p Ativan/Versed). Ethanol negative. [facility]; Propofol gtt + Versed initiated. Neuro consulted; rapid EEG completed. Keppra load given + fosphenytoin. CT Head with multiple hyperdense/hemorrhagic metastases (largest 2.6cm, unchanged) with edema of R frontal and parietal/occipital lobes. CT C-spine negative.   [facility] PCCM to admit.  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 Intubated on arrival to ED for airway protection. CT Head with known hemorrhagic brain mets. Hyperglycemic to 600s. 10[facility]  11/1 episodes of jerking seen without EEG correlate. Extubated 11/2, LTM discontinued  07/21/22 - Focal convulsive status yesterday resolving at noon with rare seizure activity afterward. Otherwise no acute issues.  Interim History / Subjective:     [facility]  Neuro repoprts they can get ceribell EEG if patient seizes again and adjust meds. Ok for XRT brain per neuro. Daughte rin law and patient togehte rin room. She wants to come off d3 diet. RN says swallowing ok and no hoarseness.   Objective:  Blood pressure (!) 154/70, pulse 83, temperature (!) 97.3 F (36.3 C), temperature source Oral, resp. rate 18, height _0  (1.575 m), weight 81.3 kg, SpO2 98 %.        Intake/Output Summary (Last 24 hours) at 07/22/2022 0854 Last data filed at 07/21/2022 1441 Gross per 24 hour  Intake 300.03 ml  Output 700 ml  Net -399.97 ml   Filed Weights   07/17/22 0300 07/21/22 0500 07/22/22 0500  Weight: 76.3 kg 76.3 kg 81.3 kg    Physical Examination: General Appearance:  Looks chronic unwell but stable.  Head:  Normocephalic, without obvious abnormality, atraumatic Eyes:  PERRL - yes, conjunctiva/corneas - muddy     Ears:  Normal external ear canals, both ears Nose:  G tube - no Throat:  ETT TUBE - no ,  OG - no Neck:  Supple,  No enlargement/tenderness/nodules Lungs: Clear to auscultation bilaterally,  Heart:  S1 and S2 normal, no murmur, CVP - no.  Pressors - no0 Abdomen:  Soft, no masses, no organomegaly Genitalia / Rectal:  Not done Extremities:  Extremities- intact Skin:  ntact in exposed areas . Sacral area - not examined Neurologic:  Sedation - none -> RASS -  +1 . Moves all 4s - yes. CAM-ICU - neg . Orientation - x3+     Resolved issues: Acute hypoxic respiratory failure - intubated in setting of status epilepticus  Assessment & Plan:   Status epilepticus ([place]) - Likely secondary to cerebral metastases from [facility]  07/22/22 -> last focal seiure 07/20/22 eeg - ileptogenicity and cortical dysfunction arising from right temporoparietal region likely secondary to underlying mass. Additionally there was mild to moderate diffuse encephalopathy, nonspecific etiology. No seizures were seen during this study. 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   Thrombocytopenia - 4Ts score low probability for HIT  11/6  - improving  Plan - check HITT panel 07/22/22 to be on safe side  - will nonetheless hold sq heparin in setting of bleeding CNS metastases but can start once platelet >= 100K +/- basedo HITT antibody test   Best Practice: (right click and "Reselect all SmartList Selections" daily)   Diet/type: NPO DVT prophylaxis: SCD - checking HITT  GI prophylaxis: PPI Lines: Port, R upper chest Foley:  Yes, and it is still needed Code Status:  full code Last date of multidisciplinary goals of care discussion [family updated 11/3, palliative care consutled. Difficult scenario, would still want to pursue elective intubation for more aggressive seizure control if reloading AEDs unsuccessful]  Patient an dfmaily update  07/22/2022   CCM off anD TRH primary from .07/23/22  D/w Dr Lupita Leash Dyann Ruddle    Dr. Brand Males, M.D., F.C.C.P,  Pulmonary and Critical Care Medicine Staff Physician, [place] Director - Interstitial Lung Disease  Program  Medical Director - [place] [facility] [place] at [place], [place], [address]   Pager: [phone], If no answer  -> Check AMION or Try [phone] Telephone (clinical office): [phone] Telephone (research): [phone]  8:54 AM 07/22/2022     LABS    PULMONARY Recent Labs  Lab 07/16/22 0106  PHART 7.4  PCO2ART 40  PO2ART 150*  HCO3 24.8  O2SAT 99.3    CBC Recent Labs  Lab 07/20/22 0620 07/21/22 0441 07/22/22 0431  HGB 9.9* 10.5* 9.9*  HCT 29.4* 30.9* 30.6*  WBC 9.3 9.3 9.7  PLT 89* 87* 96*    COAGULATION Recent Labs  Lab 07/16/22 0105  INR 1.2    CARDIAC  No results for input(s): "TROPONINI" in the last 168 hours. No results for input(s): "PROBNP" in the last 168 hours.   CHEMISTRY  Recent Labs  Lab 07/16/22 0106 07/16/22 0706 07/17/22 0601 07/18/22 0423 07/19/22 0417 07/20/22 0620 07/21/22 0441 07/22/22 0431  NA 126* 139 138 137 140 140 134* 135  K 6.2* 4.1 3.3* 4.1 4.0 3.8 3.5 3.6  CL 89* 95* 102 105 105 101 99 104  CO2 _0 GLUCOSE 836* 295* 148* 222* 109* 94 146* 169*  BUN 49* 37* 24* _1 CREATININE 1.49* 1.33* 1.02* 0.85 1.00 0.72 0.78 0.72  CALCIUM 9.8 9.7 8.5* 8.5* 9.0 9.2 8.6* 8.5*  MG 1.9 1.6* 1.8  --   --   --   --   --   PHOS  --  4.5 3.8  --   --   --   --   --    Estimated Creatinine Clearance: 72.1 mL/min (by C-G formula based on SCr of 0.72 mg/dL).   LIVER Recent Labs  Lab 07/16/22 0105 07/16/22 0106 07/16/22 0706 07/19/22 0417 07/20/22 0620 07/21/22 0441 07/22/22 0431  AST  --    < > 74* 72* 60* 55* 44*  ALT  --    < > 115* 111* 100* 95* 82*  ALKPHOS  --    < >  179* 148* 176* 182* 211*  BILITOT  --    < > 0.7 0.7 0.8 0.8 0.7  PROT  --    < > 7.0 6.2* 6.3* 6.3* 6.6  ALBUMIN  --    < > 3.5 3.0* 2.9* 2.8* 3.0*  INR 1.2  --   --   --   --   --   --    < > = values in this interval not displayed.     INFECTIOUS No results for input(s): "LATICACIDVEN", "PROCALCITON" in the last 168 hours.   ENDOCRINE CBG (last 3)  Recent Labs    07/21/22 1727 07/21/22 2015 07/22/22 0734  GLUCAP 197* 278* 157*         IMAGING x48h  - image(s) personally visualized  -   highlighted in bold No results found.

## 2022-07-23 ENCOUNTER — Ambulatory Visit
Admit: 2022-07-23 | Discharge: 2022-07-23 | Disposition: A | Discharge status: Home / Self Care | Payer: BC Managed Care – PPO | Attending: Radiation Oncology | Admitting: Radiation Oncology

## 2022-07-23 ENCOUNTER — Ambulatory Visit: Payer: BC Managed Care – PPO

## 2022-07-23 ENCOUNTER — Other Ambulatory Visit: Payer: Self-pay

## 2022-07-23 DIAGNOSIS — G40901 Epilepsy, unspecified, not intractable, with status epilepticus: Secondary | ICD-10-CM | POA: Diagnosis not present

## 2022-07-23 LAB — COMPREHENSIVE METABOLIC PANEL WITH GFR
ALT: 76 U/L — ABNORMAL HIGH (ref 0–44)
AST: 44 U/L — ABNORMAL HIGH (ref 15–41)
Albumin: 3.4 g/dL — ABNORMAL LOW (ref 3.5–5.0)
Alkaline Phosphatase: 218 U/L — ABNORMAL HIGH (ref 38–126)
Anion gap: 9 (ref 5–15)
BUN: 14 mg/dL (ref 8–23)
CO2: 26 mmol/L (ref 22–32)
Calcium: 9.1 mg/dL (ref 8.9–10.3)
Chloride: 102 mmol/L (ref 98–111)
Creatinine, Ser: 0.7 mg/dL (ref 0.44–1.00)
GFR, Estimated: >60 mL/min
Glucose, Bld: 147 mg/dL — ABNORMAL HIGH (ref 70–99)
Potassium: 3.3 mmol/L — ABNORMAL LOW (ref 3.5–5.1)
Sodium: 137 mmol/L (ref 135–145)
Total Bilirubin: 0.6 mg/dL (ref 0.3–1.2)
Total Protein: 7 g/dL (ref 6.5–8.1)

## 2022-07-23 LAB — RAD ONC ARIA SESSION SUMMARY
Course Elapsed Days: 1
Plan Fractions Treated to Date: 2
Plan Prescribed Dose Per Fraction: 3 Gy
Plan Total Fractions Prescribed: 10
Plan Total Prescribed Dose: 30 Gy
Reference Point Dosage Given to Date: 6 Gy
Reference Point Session Dosage Given: 3 Gy
Session Number: 2

## 2022-07-23 LAB — CBC WITH DIFFERENTIAL/PLATELET
Abs Immature Granulocytes: 0.45 10*3/uL — ABNORMAL HIGH (ref 0.00–0.07)
Basophils Absolute: 0.1 10*3/uL (ref 0.0–0.1)
Basophils Relative: 0 %
Eosinophils Absolute: 0 10*3/uL (ref 0.0–0.5)
Eosinophils Relative: 0 %
HCT: 30.9 % — ABNORMAL LOW (ref 36.0–46.0)
Hemoglobin: 10 g/dL — ABNORMAL LOW (ref 12.0–15.0)
Immature Granulocytes: 4 %
Lymphocytes Relative: 9 %
Lymphs Abs: 1.1 10*3/uL (ref 0.7–4.0)
MCH: 32.4 pg (ref 26.0–34.0)
MCHC: 32.4 g/dL (ref 30.0–36.0)
MCV: 100 fL (ref 80.0–100.0)
Monocytes Absolute: 1.4 10*3/uL — ABNORMAL HIGH (ref 0.1–1.0)
Monocytes Relative: 12 %
Neutro Abs: 8.9 10*3/uL — ABNORMAL HIGH (ref 1.7–7.7)
Neutrophils Relative %: 75 %
Platelets: 114 10*3/uL — ABNORMAL LOW (ref 150–400)
RBC: 3.09 MIL/uL — ABNORMAL LOW (ref 3.87–5.11)
RDW: 15.6 % — ABNORMAL HIGH (ref 11.5–15.5)
WBC: 11.9 10*3/uL — ABNORMAL HIGH (ref 4.0–10.5)
nRBC: 0 % (ref 0.0–0.2)

## 2022-07-23 LAB — GLUCOSE, CAPILLARY
Glucose-Capillary: 162 mg/dL — ABNORMAL HIGH (ref 70–99)
Glucose-Capillary: 191 mg/dL — ABNORMAL HIGH (ref 70–99)
Glucose-Capillary: 149 mg/dL — ABNORMAL HIGH (ref 70–99)
Glucose-Capillary: 221 mg/dL — ABNORMAL HIGH (ref 70–99)

## 2022-07-23 LAB — PHOSPHORUS: Phosphorus: 4.5 mg/dL (ref 2.5–4.6)

## 2022-07-23 LAB — PHENYTOIN LEVEL, FREE AND TOTAL
Phenytoin, Free: 1.4 ug/mL (ref 1.0–2.0)
Phenytoin, Total: 15.6 ug/mL (ref 10.0–20.0)

## 2022-07-23 LAB — MAGNESIUM: Magnesium: 1.4 mg/dL — ABNORMAL LOW (ref 1.7–2.4)

## 2022-07-23 MED ORDER — LEVETIRACETAM 500 MG PO TABS
1500.0000 mg | ORAL_TABLET | Freq: Two times a day (BID) | ORAL | Status: DC
  Administered 2022-07-23 – 2022-08-02 (×20): 1500 mg via ORAL
  Filled 2022-07-23 (×21): qty 3

## 2022-07-23 MED ORDER — DEXAMETHASONE 4 MG PO TABS
4.0000 mg | ORAL_TABLET | Freq: Four times a day (QID) | ORAL | Status: DC
  Administered 2022-07-23 – 2022-07-26 (×12): 4 mg via ORAL
  Filled 2022-07-23 (×12): qty 1

## 2022-07-23 MED ORDER — PHENYTOIN 50 MG PO CHEW
100.0000 mg | CHEWABLE_TABLET | Freq: Three times a day (TID) | ORAL | Status: DC
  Administered 2022-07-23 – 2022-07-30 (×21): 100 mg via ORAL
  Filled 2022-07-23 (×22): qty 2

## 2022-07-23 NOTE — Progress Notes (Signed)
Subjective: Patient reports that she is doing well but endorses drowsiness with administration of her AEDs.  She started whole brain XRT yesterday.  LTM EEG has been discontinued.  Objective: Current vital signs: BP (!) 159/85 (BP Location: Right Arm)   Pulse 78   Temp 97.8 F (36.6 C) (Oral)   Resp 18   Ht 5\' 2"  (1.575 m)   Wt 81.3 kg   SpO2 95%   BMI 32.78 kg/m  Vital signs in last 24 hours: Temp:  [97.6 F (36.4 C)-98 F (36.7 C)] 97.8 F (36.6 C) (11/07 1033) Pulse Rate:  [67-78] 78 (11/07 1033) Resp:  [17-20] 18 (11/07 1033) BP: (146-159)/(66-85) 159/85 (11/07 1033) SpO2:  [95 %-97 %] 95 % (11/07 1033)  Intake/Output from previous day: 11/06 0701 - 11/07 0700 In: 480 [P.O.:480] Out: -  Intake/Output this shift: No intake/output data recorded. Nutritional status:  Diet Order             Diet Carb Modified Fluid consistency: Thin; Room service appropriate? Yes  Diet effective now                   Neurologic Exam:  NEURO:  Mental Status: AA&Ox3  Speech/Language: speech is with mild dysarthria.  Fluency and comprehension intact.  Cranial Nerves:  II: PERRL.  III, IV, VI: EOMI.   V: Sensation is intact to light touch and symmetrical to face.  VII: Smile is symmetrical.   VIII: hearing intact to voice. IX, X: Phonation is normal.  QB:HALPFXTK shrug 5/5. XII: tongue is midline without fasciculations. Motor: 5/5 strength to all muscle groups tested.  Tone: is normal and bulk is normal Sensation- Intact to light touch bilaterally.   Coordination: FTN intact bilaterally.No drift.  Gait- deferred   Lab Results: Results for orders placed or performed during the hospital encounter of 07/16/22 (from the past 48 hour(s))  Glucose, capillary     Status: Abnormal   Collection Time: 07/21/22  5:27 PM  Result Value Ref Range   Glucose-Capillary 197 (H) 70 - 99 mg/dL    Comment: Glucose reference range applies only to samples taken after fasting for at least  8 hours.  Glucose, capillary     Status: Abnormal   Collection Time: 07/21/22  8:15 PM  Result Value Ref Range   Glucose-Capillary 278 (H) 70 - 99 mg/dL    Comment: Glucose reference range applies only to samples taken after fasting for at least 8 hours.  CBC with Differential/Platelet     Status: Abnormal   Collection Time: 07/22/22  4:31 AM  Result Value Ref Range   WBC 9.7 4.0 - 10.5 K/uL   RBC 3.02 (L) 3.87 - 5.11 MIL/uL   Hemoglobin 9.9 (L) 12.0 - 15.0 g/dL   HCT 30.6 (L) 36.0 - 46.0 %   MCV 101.3 (H) 80.0 - 100.0 fL   MCH 32.8 26.0 - 34.0 pg   MCHC 32.4 30.0 - 36.0 g/dL   RDW 15.6 (H) 11.5 - 15.5 %   Platelets 96 (L) 150 - 400 K/uL    Comment: SPECIMEN CHECKED FOR CLOTS Immature Platelet Fraction may be clinically indicated, consider ordering this additional test [id number]    nRBC 0.0 0.0 - 0.2 %   Neutrophils Relative % 77 %   Neutro Abs 7.5 1.7 - 7.7 K/uL   Lymphocytes Relative 9 %   Lymphs Abs 0.8 0.7 - 4.0 K/uL   Monocytes Relative 10 %   Monocytes Absolute 1.0 0.1 - 1.0  K/uL   Eosinophils Relative 0 %   Eosinophils Absolute 0.0 0.0 - 0.5 K/uL   Basophils Relative 0 %   Basophils Absolute 0.0 0.0 - 0.1 K/uL   Immature Granulocytes 4 %   Abs Immature Granulocytes 0.36 (H) 0.00 - 0.07 K/uL    Comment: Performed at [facility], [place] [address] [place], [place] [address]  Comprehensive metabolic panel     Status: Abnormal   Collection Time: 07/22/22  4:31 AM  Result Value Ref Range   Sodium 135 135 - 145 mmol/L   Potassium 3.6 3.5 - 5.1 mmol/L   Chloride 104 98 - 111 mmol/L   CO2 26 22 - 32 mmol/L   Glucose, Bld 169 (H) 70 - 99 mg/dL    Comment: Glucose reference range applies only to samples taken after fasting for at least 8 hours.   BUN 13 8 - 23 mg/dL   Creatinine, Ser 0.72 0.44 - 1.00 mg/dL   Calcium 8.5 (L) 8.9 - 10.3 mg/dL   Total Protein 6.6 6.5 - 8.1 g/dL   Albumin 3.0 (L) 3.5 - 5.0 g/dL   AST 44 (H) 15 - 41 U/L   ALT 82 (H) 0 -  44 U/L   Alkaline Phosphatase 211 (H) 38 - 126 U/L   Total Bilirubin 0.7 0.3 - 1.2 mg/dL   GFR, Estimated >60 >60 mL/min    Comment: (NOTE) Calculated using the CKD-EPI Creatinine Equation (2021)    Anion gap 5 5 - 15    Comment: Performed at [facility], [place] [address], [place], [place] [address]  Glucose, capillary     Status: Abnormal   Collection Time: 07/22/22  7:34 AM  Result Value Ref Range   Glucose-Capillary 157 (H) 70 - 99 mg/dL    Comment: Glucose reference range applies only to samples taken after fasting for at least 8 hours.  Phenytoin level, total     Status: None   Collection Time: 07/22/22  7:48 AM  Result Value Ref Range   Phenytoin Lvl 16.4 10.0 - 20.0 ug/mL    Comment: Performed at [facility], [place] [address] [place], [place] [address]  Glucose, capillary     Status: Abnormal   Collection Time: 07/22/22 11:25 AM  Result Value Ref Range   Glucose-Capillary 217 (H) 70 - 99 mg/dL    Comment: Glucose reference range applies only to samples taken after fasting for at least 8 hours.  Glucose, capillary     Status: Abnormal   Collection Time: 07/22/22  5:22 PM  Result Value Ref Range   Glucose-Capillary 213 (H) 70 - 99 mg/dL    Comment: Glucose reference range applies only to samples taken after fasting for at least 8 hours.  Glucose, capillary     Status: Abnormal   Collection Time: 07/22/22  9:07 PM  Result Value Ref Range   Glucose-Capillary 271 (H) 70 - 99 mg/dL    Comment: Glucose reference range applies only to samples taken after fasting for at least 8 hours.  CBC with Differential/Platelet     Status: Abnormal   Collection Time: 07/23/22  5:49 AM  Result Value Ref Range   WBC 11.9 (H) 4.0 - 10.5 K/uL   RBC 3.09 (L) 3.87 - 5.11 MIL/uL   Hemoglobin 10.0 (L) 12.0 - 15.0 g/dL   HCT 30.9 (L) 36.0 - 46.0 %   MCV 100.0 80.0 - 100.0 fL   MCH 32.4 26.0 - 34.0 pg  MCHC 32.4 30.0 - 36.0 g/dL   RDW 15.6 (H) 11.5 - 15.5 %    Platelets 114 (L) 150 - 400 K/uL   nRBC 0.0 0.0 - 0.2 %   Neutrophils Relative % 75 %   Neutro Abs 8.9 (H) 1.7 - 7.7 K/uL   Lymphocytes Relative 9 %   Lymphs Abs 1.1 0.7 - 4.0 K/uL   Monocytes Relative 12 %   Monocytes Absolute 1.4 (H) 0.1 - 1.0 K/uL   Eosinophils Relative 0 %   Eosinophils Absolute 0.0 0.0 - 0.5 K/uL   Basophils Relative 0 %   Basophils Absolute 0.1 0.0 - 0.1 K/uL   Immature Granulocytes 4 %   Abs Immature Granulocytes 0.45 (H) 0.00 - 0.07 K/uL    Comment: Performed at [facility], [place] [address], [place], [place] [address]  Comprehensive metabolic panel     Status: Abnormal   Collection Time: 07/23/22  5:49 AM  Result Value Ref Range   Sodium 137 135 - 145 mmol/L   Potassium 3.3 (L) 3.5 - 5.1 mmol/L   Chloride 102 98 - 111 mmol/L   CO2 26 22 - 32 mmol/L   Glucose, Bld 147 (H) 70 - 99 mg/dL    Comment: Glucose reference range applies only to samples taken after fasting for at least 8 hours.   BUN 14 8 - 23 mg/dL   Creatinine, Ser 0.70 0.44 - 1.00 mg/dL   Calcium 9.1 8.9 - 10.3 mg/dL   Total Protein 7.0 6.5 - 8.1 g/dL   Albumin 3.4 (L) 3.5 - 5.0 g/dL   AST 44 (H) 15 - 41 U/L   ALT 76 (H) 0 - 44 U/L   Alkaline Phosphatase 218 (H) 38 - 126 U/L   Total Bilirubin 0.6 0.3 - 1.2 mg/dL   GFR, Estimated >60 >60 mL/min    Comment: (NOTE) Calculated using the CKD-EPI Creatinine Equation (2021)    Anion gap 9 5 - 15    Comment: Performed at [facility], [place] [address] [place], [place] [address]  Magnesium     Status: Abnormal   Collection Time: 07/23/22  5:49 AM  Result Value Ref Range   Magnesium 1.4 (L) 1.7 - 2.4 mg/dL    Comment: Performed at [facility], [place] [address], [place], [place] [address]  Phosphorus     Status: None   Collection Time: 07/23/22  5:49 AM  Result Value Ref Range   Phosphorus 4.5 2.5 - 4.6 mg/dL    Comment: Performed at [facility], [place] [address],  [place],  [address]  Glucose, capillary     Status: Abnormal   Collection Time: 07/23/22  7:29 AM  Result Value Ref Range   Glucose-Capillary 162 (H) 70 - 99 mg/dL    Comment: Glucose reference range applies only to samples taken after fasting for at least 8 hours.  Glucose, capillary     Status: Abnormal   Collection Time: 07/23/22 11:11 AM  Result Value Ref Range   Glucose-Capillary 191 (H) 70 - 99 mg/dL    Comment: Glucose reference range applies only to samples taken after fasting for at least 8 hours.    Recent Results (from the past 240 hour(s))  SARS Coronavirus 2 by RT PCR (hospital order, performed in [facility] hospital lab) *cepheid single result test* Anterior Nasal Swab     Status: None   Collection Time: 07/16/22  1:06 AM   Specimen: Anterior Nasal Swab  Result Value Ref Range Status   SARS Coronavirus 2 by RT PCR NEGATIVE NEGATIVE Final    Comment: (NOTE) SARS-CoV-2 target nucleic acids are NOT DETECTED.  The SARS-CoV-2 RNA is generally detectable in upper and lower respiratory specimens during the acute phase of infection. The lowest concentration of SARS-CoV-2 viral copies this assay can detect is 250 copies / mL. A negative result does not preclude SARS-CoV-2 infection and should not be used as the sole basis for treatment or other patient management decisions.  A negative result may occur with improper specimen collection / handling, submission of specimen other than nasopharyngeal swab, presence of viral mutation(s) within the areas targeted by this assay, and inadequate number of viral copies (<250 copies / mL). A negative result must be combined with clinical observations, patient history, and epidemiological information.  Fact Sheet for Patients:   https://www.patel.info/  Fact Sheet for Healthcare Providers: https://hall.com/  This test is not yet approved or  cleared by the [place] FDA and has been  authorized for detection and/or diagnosis of SARS-CoV-2 by FDA under an Emergency Use Authorization (EUA).  This EUA will remain in effect (meaning this test can be used) for the duration of the COVID-19 declaration under Section 564(b)(1) of the Act, 21 U.S.C. section 360bbb-3(b)(1), unless the authorization is terminated or revoked sooner.  Performed at [facility], [address] [place], [place] [address]   Surgical PCR screen     Status: None   Collection Time: 07/16/22  5:57 AM   Specimen: Nasal Mucosa; Nasal Swab  Result Value Ref Range Status   MRSA, PCR NEGATIVE NEGATIVE Final   Staphylococcus aureus NEGATIVE NEGATIVE Final    Comment: (NOTE) The Xpert SA Assay (FDA approved for NASAL specimens in patients 41 years of age and older), is one component of a comprehensive surveillance program. It is not intended to diagnose infection nor to guide or monitor treatment. Performed at [facility], [place] [address] [place], [place] [address]     Lipid Panel No results for input(s): "CHOL", "TRIG", "HDL", "CHOLHDL", "VLDL", "LDLCALC" in the last 72 hours.  Studies/Results: No results found.  Medications: Prior to Admission:  Medications Prior to Admission  Medication Sig Dispense Refill Last Dose   acetaminophen (TYLENOL) 500 MG tablet Take 1,000 mg by mouth every 6 (six) hours as needed for headache or moderate pain.   Past Week   albuterol (VENTOLIN HFA) 108 (90 Base) MCG/ACT inhaler Inhale 2 puffs into the lungs as needed for wheezing or shortness of breath.   unk   dexamethasone (DECADRON) 4 MG tablet Take 2 tablets (8 mg total) by mouth daily. Start the day after chemotherapy for 2 days. Take with food. (Patient taking differently: Take 8 mg by mouth 3 (three) times daily.) 30 tablet 1 07/15/2022   famotidine (PEPCID) 40 MG tablet Take 1 tablet (40 mg total) by mouth 2 (two) times daily. 60 tablet 1 07/15/2022   fluconazole (DIFLUCAN) 100 MG tablet Take 1  tablet (100 mg total) by mouth daily. 30 tablet 1 07/15/2022   ibuprofen (ADVIL) 200 MG tablet Take 400 mg by mouth every 6 (six) hours as needed for headache (pain).   Past Week   levothyroxine (SYNTHROID) 125 MCG tablet TAKE 1 TABLET(125 MCG) BY MOUTH DAILY BEFORE BREAKFAST (Patient taking differently: Take 125 mcg by mouth daily before breakfast.) 90 tablet 3 07/15/2022   lidocaine-prilocaine (EMLA) cream Apply to affected area once (Patient taking differently: Apply 1 Application topically  as needed (for port access).) 30 g 3 Past Month   Multiple Vitamin (MULTIVITAMIN) tablet Take 1 tablet by mouth daily.   07/15/2022   naloxone (NARCAN) nasal spray 4 mg/0.1 mL Place 0.4 mg into the nose as needed (overdose).   never   ondansetron (ZOFRAN) 8 MG tablet Take 1 tablet (8 mg total) by mouth every 8 (eight) hours as needed for nausea or vomiting. Start on the third day after chemotherapy. (Patient taking differently: Take 8 mg by mouth as needed for nausea or vomiting.) 30 tablet 1 Past Month   potassium chloride SA (KLOR-CON M) 20 MEQ tablet TAKE 2 TABLETS BY MOUTH TWICE DAILY 120 tablet 2 07/15/2022   prochlorperazine (COMPAZINE) 10 MG tablet Take 1 tablet (10 mg total) by mouth every 6 (six) hours as needed for nausea or vomiting. (Patient taking differently: Take 10 mg by mouth as needed for nausea or vomiting.) 30 tablet 1 Past Month   simvastatin (ZOCOR) 40 MG tablet Take 40 mg by mouth daily.   07/15/2022   sitaGLIPtin-metformin (JANUMET) 50-500 MG tablet Take 1 tablet by mouth 2 (two) times daily with a meal.   07/15/2022   ciprofloxacin (CIPRO) 500 MG tablet Take 1 tablet (500 mg total) by mouth daily with breakfast. (Patient not taking: Reported on 05/27/2022) 15 tablet 4 Completed Course   esomeprazole (NEXIUM) 20 MG capsule Take 20 mg by mouth daily at 12 noon. (Patient not taking: Reported on 07/16/2022)   Not Taking   fentaNYL (DURAGESIC) 12 MCG/HR Place 1 patch onto the skin every 3  (three) days. (Patient not taking: Reported on 07/16/2022) 5 patch 0 Not Taking   furosemide (LASIX) 40 MG tablet Take 1 tablet (40 mg total) by mouth daily. (Patient not taking: Reported on 07/16/2022) 30 tablet 11 Not Taking   Oxycodone HCl 10 MG TABS Take 1 tablet (10 mg total) by mouth every 3 (three) hours as needed (breakthrough pain). (Patient not taking: Reported on 07/16/2022) 90 tablet 0 Not Taking   senna-docusate (SENOKOT-S) 8.6-50 MG tablet Take 1 tablet by mouth 2 (two) times daily. (Patient not taking: Reported on 07/16/2022)   Not Taking   Scheduled:  Chlorhexidine Gluconate Cloth  6 each Topical q morning   dexamethasone  4 mg Oral Q6H   docusate sodium  100 mg Oral BID   insulin aspart  0-20 Units Subcutaneous TID with meals   insulin aspart  0-5 Units Subcutaneous QHS   insulin glargine-yfgn  5 Units Subcutaneous Daily   levETIRAcetam  1,500 mg Oral BID   levothyroxine  125 mcg Oral Q0600   melatonin  3 mg Oral QHS   pantoprazole  40 mg Oral QHS   phenytoin  100 mg Oral TID   polyethylene glycol  17 g Oral Daily   Continuous:  lacosamide (VIMPAT) IV 200 mg (07/23/22 1145)    ASSESSMENT: 61 year old female with 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 -Seizure activity recurred Saturday and PHT was loaded -New onset seizure with status epilepticus at presentation was most likely due to metastatic brain disease as well as hyperglycemia - LTM EEG has been discontinued as seizure activity has ceased   Recommendations: -Continue Keppra at 1000 mg twice daily. -Continue Vimpat 200 mg twice daily -Phenytoin 100 mg IV TID  -Dr. Hortense Ramal has discussed with family that if  she has status epilepticus again, may need to reintubate.  Family understands and is in agreement -Whole brain XRT per oncology  team -Continue seizure precautions -As needed IV Ativan 2 mg for clinical seizure-like activity -Management of rest of the comorbidities per primary team -Neurology to follow as needed   LOS: 7 days   [place] , MSN, AGACNP-BC [facility] See Amion for schedule and pager information 07/23/2022 2:39 PM   NEUROHOSPITALIST ADDENDUM Performed a face to face diagnostic evaluation.   I have reviewed the contents of history and physical exam as documented by PA/ARNP/Resident and agree with above documentation.  I have discussed and formulated the above plan as documented. Edits to the note have been made as needed.  Impression/Key exam findings/Plan: She is awake alert and oriented x3 and sitting in the recliner with no complaints at this time.  She has not had any more seizures and she underwent radiation treatment today.    Recommend continuing Keppra, Vimpat and Dilantin at the current dosage.  We will sign off at this time but will be available to see the patient or to answer any questions.  Please feel free to reach out to [name] with any questions or concerns.  Donnetta Simpers, MD [facility] [phone]   If 7pm to 7am, please call on call as listed on AMION.

## 2022-07-23 NOTE — Hospital Course (Addendum)
61 year old woman who presented to [facility] ED 10/30 via EMS after being found down at home with seizure-like activity. LKW 2040, found around 2245 having L-sided partial seizures and hyperglycemic to 300s. Recent transient spontaenously-resolving episodes of slurred speech at home per family. PMHx significant for HTN, T2DM, hypothyroidism, SCLC (s/p chemotherapy, followed by Dr. Marin Olp) with known liver metastasis and recently diagnosed brain metastasis While inpatient, radiation was being arranged. Pt had concerns of difficulty swallowing. Per Palliative Care, pt would not want PEG and wishes to remain full code, full scope of tx

## 2022-07-23 NOTE — Plan of Care (Signed)
  Problem: Coping: Goal: Ability to adjust to condition or change in health will improve Outcome: Progressing   Problem: Coping: Goal: Level of anxiety will decrease Outcome: Progressing   Problem: Safety: Goal: Ability to remain free from injury will improve Outcome: Progressing

## 2022-07-23 NOTE — TOC Progression Note (Signed)
Transition of Care [facility]) - Progression Note    Patient Details  Name: Pamela [name] MRN: [id number] Date of Birth: Feb 19, 1961  Transition of Care [facility]) CM/SW Contact  Saba Neuman, Juliann Pulse, RN Phone Number: 07/23/2022, 3:47 PM  Clinical Narrative:noted CIR following.       Expected Discharge Plan: IP Rehab Facility Barriers to Discharge: Continued Medical Work up  Expected Discharge Plan and Services Expected Discharge Plan: [place]                                               Social Determinants of Health (SDOH) Interventions    Readmission Risk Interventions    11/06/2021    1:50 PM  Readmission Risk Prevention Plan  Transportation Screening Complete  PCP or Specialist Appt within 3-5 Days Complete  HRI or [place] Complete  Social Work Consult for [place] Planning/Counseling Complete  Palliative Care Screening Not Applicable  Medication Review [employer]) Complete

## 2022-07-23 NOTE — Progress Notes (Signed)
Initial Nutrition Assessment  DOCUMENTATION CODES:  Not applicable  INTERVENTION:  -Liberalize diet to carb modified only -Offer meal preferences to optimize po intake -Allow ONS from home, or continue to offer Ensure Max to increase protein and calorie consumption -Provide diet education for home.  NUTRITION DIAGNOSIS:  Predicted suboptimal nutrient intake related to chronic illness as evidenced by percent weight loss.  GOAL:  Patient will meet greater than or equal to 90% of their needs  MONITOR:  PO intake REASON FOR ASSESSMENT:  Malnutrition Screening Tool   ASSESSMENT:  Pt is a 61yo F with PMH of HTN, T2DM, hypothyroidism, SCLC (s/p chemotherapy, followed by Dr. Marin Olp) with known liver metastasis and recently diagnosed brain metastasis receiving whole brain radiation who presents with seizure like activity.  Visited pt at bedside with family present this afternoon. Pt reports a good appetite and denies recent n/v and weight change. However weight history in EMR shows a significant 5.7% weight loss in 1 month. Pt was eating lunch without difficulty during assessment. No chewing/swallowing difficulty noted. Reports using [employer] at home, unavailable during admission. Declines alternative ONS at this time. Pt and family think pt will eat better with more seasoning/salt. Recommend carb modified diet only. Pt unavailable for NFPE during visit, will complete at follow up.  Medications reviewed and include: decadron, colace, novolog, semglee, synthroid, protonix, miralax, dilantin, lacosamide, keppra  Labs reviewed: K:3.3, BG:147-271 (on steroid), Mg:1.4, Alk Phos:218, AST:44, ALT:76, A1c:8.9  Weight History: 07/22/22 81.3 kg  07/15/22 86.6 kg  06/25/22 86.2 kg  05/27/22 89.4 kg  05/06/22 86.7 kg  05/01/22 87.1 kg  04/10/22 89.8 kg  03/05/22 88.6 kg  Significant 5.7% unintended weight loss in 1 month   NUTRITION - FOCUSED PHYSICAL EXAM: Deferred, eating lunch  Diet  Order:   Diet Order             Diet Carb Modified Fluid consistency: Thin; Room service appropriate? Yes  Diet effective now                   EDUCATION NEEDS:   Education needs have been addressed  Skin:  Skin Assessment: Reviewed RN Assessment  Last BM:  11/6  Height:  Ht Readings from Last 1 Encounters:  07/16/22 _0  (1.575 m)   Weight:  Wt Readings from Last 1 Encounters:  07/22/22 81.3 kg    Ideal Body Weight:  50 kg  BMI:  Body mass index is 32.78 kg/m.  Estimated Nutritional Needs:  Kcal:  2030-2440kcal (25-30kcal/kg) Protein:  75-100g (1.5-2.0g/kg IBW) Fluid:  >2L  Candise Bowens, MS, RD, LDN, CNSC See AMiON for contact information

## 2022-07-23 NOTE — Progress Notes (Signed)
PROGRESS NOTE    Patient: Pamela [name]                            PCP: Marinda Elk, MD                    DOB: 1960-11-08            DOA: 07/16/2022 [id number]             DOS: 07/23/2022, 12:28 PM   LOS: 7 days   Date of Service: The patient was seen and examined on 07/23/2022  Subjective:   The patient was seen and examined this morning. Hemodynamically stable, status post radiation therapy this morning tolerated well no issues overnight .  Brief Narrative:   61 year old woman who [facility] ED 10/30 via EMS after being found down at home with seizure-like activity. LKW 2040, found around 2245 having L-sided partial seizures and hyperglycemic to 300s. Recent transient spontaenously-resolving episodes of slurred speech at home per family. PMHx significant for HTN, T2DM, hypothyroidism, SCLC (s/p chemotherapy, followed by Dr. Marin Olp) with known liver metastasis and recently diagnosed brain metastasis with plan for whole brain radiation (not yet started).    On EMS arrival, patient was reportedly mostly unresponsive with ongoing L-sided partial seizure activity. Versed IV (25m total) administered without cessation of seizure. Labs were notable for WBC 11.6, H&H/Plt WNL, hyponatremia with Na 126, K 6.2, serum glucose 836, BUN 49, Cr 1.49 (baseline 0.9). LFTs elevated with AST/ALT 76/110, Alk Phos 226. INR WNL. UA with > 500 glucose; UDS +BZD (s/p Ativan/Versed). Ethanol negative. [facility]; Propofol gtt + Versed initiated. Neuro consulted; rapid EEG completed. Keppra load given + fosphenytoin. CT Head with multiple hyperdense/hemorrhagic metastases (largest 2.6cm, unchanged) with edema of R frontal and parietal/occipital lobes. CT C-spine negative.    [facility] 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 Additionally there was mild to moderate diffuse encephalopathy, nonspecific etiology. No seizures were seen during this study. 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    Thrombocytopenia  -  4Ts score low probability for HIT    - improving  -No signs of bleeding   - check HITT panel 07/22/22 to be on safe side  - will nonetheless hold sq heparin in setting of bleeding CNS metastases but can start once platelet >= 100K +/- basedo HITT antibody test   FENT: Diet/type: Advancing diet as tolerated  DVT prophylaxis: SCD - checking HITT   GI prophylaxis: PPI  Lines: Port, R upper chest Foley:  Yes, and it is still needed Code Status:  full code  Last date of  multidisciplinary goals of care discussion [family updated 11/3, palliative care consutled. Difficult scenario, would still want to pursue elective intubation for more aggressive seizure control if reloading AEDs unsuccessful]   Patient's family update 07/22/2022     ----------------------------------------------------------------------------------------------------------------------------------------------- Nutritional status:  The patient's BMI is: Body mass index is 32.78 kg/m. I agree with the assessment and plan as outlined ------------------------------------------------------------------------------------------------------------------------------------------------  DVT prophylaxis:  Place and maintain sequential compression device Start: 07/20/22 0756 SCDs Start: 07/16/22 0508   Code Status:   Code Status: Full Code  Family Communication: No family member present at bedside- attempt will be made to update daily The above findings and plan of care has been discussed with patient (and family)  in detail,  they expressed understanding and agreement of above. -Advance care planning has been discussed.   Admission status:   Status is: Inpatient Remains inpatient appropriate because: Continue need for treatment, radiation therapy     Procedures:   No admission procedures for hospital encounter.   Antimicrobials:  Anti-infectives (From admission, onward)    None        Medication:   Chlorhexidine Gluconate Cloth  6 each Topical q morning   dexamethasone (DECADRON) injection  4 mg Intravenous Q6H   docusate sodium  100 mg Oral BID   insulin aspart  0-20 Units Subcutaneous TID with meals   insulin aspart  0-5 Units Subcutaneous QHS   insulin glargine-yfgn  5 Units Subcutaneous Daily   levothyroxine  125 mcg Oral Q0600   melatonin  3 mg Oral QHS   pantoprazole  40 mg Oral QHS   phenytoin (DILANTIN) IV  100 mg Intravenous Q8H   polyethylene glycol  17 g Oral Daily     acetaminophen, dextrose, LORazepam, mouth rinse, phenol   Objective:   Vitals:   07/22/22 1344 07/22/22 2108 07/23/22 0236 07/23/22 1033  BP: (!) 168/73 (!) 146/66 (!) 151/79 (!) 159/85  Pulse: 84 67 67 78  Resp: _0 Temp: 98 F (36.7 C) 97.6 F (36.4 C) 98 F (36.7 C) 97.8 F (36.6 C)  TempSrc: Oral Oral Oral Oral  SpO2: 96% 97% 97% 95%  Weight:      Height:        Intake/Output Summary (Last 24 hours) at 07/23/2022 1228 Last data filed at 07/23/2022 0539 Gross per 24 hour  Intake 480 ml  Output --  Net 480 ml   Filed Weights   07/17/22 0300 07/21/22 0500 07/22/22 0500  Weight: 76.3 kg 76.3 kg 81.3 kg     Examination:   Physical Exam  Constitution:  Alert, cooperative, no distress,  Appears calm and comfortable  Psychiatric:   Normal and stable mood and affect, cognition intact,   HEENT:        Normocephalic, PERRL, otherwise with in Normal limits  Chest:         Chest symmetric Cardio vascular:  S1/S2, RRR, No murmure, No  Rubs or Gallops  pulmonary: Clear to auscultation bilaterally, respirations unlabored, negative wheezes / crackles Abdomen: Soft, non-tender, non-distended, bowel sounds,no masses, no organomegaly Muscular skeletal: Limited exam - in bed, able to move all 4 extremities,   Global generalized weaknesses Neuro: CNII-XII intact. , normal motor and sensation, reflexes intact  Extremities: No pitting edema lower extremities, +2 pulses  Skin: Dry, warm to touch, negative for any Rashes, No open wounds Wounds: per nursing documentation   ------------------------------------------------------------------------------------------------------------------------------------------    LABs:     Latest Ref Rng & Units 07/23/2022    5:49 AM 07/22/2022    4:31 AM 07/21/2022    4:41 AM  CBC  WBC 4.0 - 10.5 K/uL 11.9  9.7  9.3   Hemoglobin 12.0 - 15.0 g/dL 10.0  9.9  10.5   Hematocrit 36.0 - 46.0 % 30.9  30.6  30.9   Platelets 150 - 400 K/uL  114  96  87       Latest Ref Rng & Units 07/23/2022    5:49 AM 07/22/2022    4:31 AM 07/21/2022    4:41 AM  CMP  Glucose 70 - 99 mg/dL 147  169  146   BUN 8 - 23 mg/dL _0 Creatinine 0.44 - 1.00 mg/dL 0.70  0.72  0.78   Sodium 135 - 145 mmol/L 137  135  134   Potassium 3.5 - 5.1 mmol/L 3.3  3.6  3.5   Chloride 98 - 111 mmol/L 102  104  99   CO2 22 - 32 mmol/L _1 Calcium 8.9 - 10.3 mg/dL 9.1  8.5  8.6   Total Protein 6.5 - 8.1 g/dL 7.0  6.6  6.3   Total Bilirubin 0.3 - 1.2 mg/dL 0.6  0.7  0.8   Alkaline Phos 38 - 126 U/L 218  211  182   AST 15 - 41 U/L 44  44  55   ALT 0 - 44 U/L 76  82  95        Micro Results Recent Results (from the past 240 hour(s))  SARS Coronavirus 2 by RT PCR (hospital order, performed in [facility] hospital lab) *cepheid single result test* Anterior Nasal Swab     Status: None   Collection Time: 07/16/22  1:06 AM   Specimen: Anterior Nasal Swab  Result Value Ref Range Status   SARS Coronavirus 2 by RT PCR NEGATIVE NEGATIVE Final    Comment: (NOTE) SARS-CoV-2 target nucleic acids are NOT DETECTED.  The SARS-CoV-2 RNA is generally detectable in upper and lower respiratory specimens during the acute phase of infection. The lowest concentration of SARS-CoV-2 viral copies this assay can detect is 250 copies / mL. A negative result does not preclude SARS-CoV-2 infection and should not be used as the sole basis for treatment or other patient management decisions.  A negative result may occur with improper specimen collection / handling, submission of specimen other than nasopharyngeal swab, presence of viral mutation(s) within the areas targeted by this assay, and inadequate number of viral copies (<250 copies / mL). A negative result must be combined with clinical observations, patient history, and epidemiological information.  Fact Sheet for Patients:   https://www.patel.info/  Fact Sheet for Healthcare  Providers: https://hall.com/  This test is not yet approved or  cleared by the [place] FDA and has been authorized for detection and/or diagnosis of SARS-CoV-2 by FDA under an Emergency Use Authorization (EUA).  This EUA will remain in effect (meaning this test can be used) for the duration of the COVID-19 declaration under Section 564(b)(1) of the Act, 21 U.S.C. section 360bbb-3(b)(1), unless the authorization is terminated or revoked sooner.  Performed at [facility], [address] [place], [place] [address]   Surgical PCR screen     Status: None   Collection Time: 07/16/22  5:57 AM   Specimen: Nasal Mucosa; Nasal Swab  Result Value Ref Range Status   MRSA, PCR NEGATIVE NEGATIVE Final   Staphylococcus aureus NEGATIVE NEGATIVE Final    Comment: (NOTE) The Xpert SA Assay (FDA approved for NASAL specimens in patients 78 years of age and older), is one component of a comprehensive surveillance program. It is not intended to diagnose infection nor to guide or monitor treatment. Performed at [facility], [place] [address], [place], [place] [address]     Radiology Reports No results found.  SIGNED: Deatra James, MD, FHM. [facility],  Pager (please use [email or web address] to page/text) Please use Epic Secure Chat for non-urgent communication (7AM-7PM)  If 7PM-7AM, please contact night-coverage [email or web address], 07/23/2022, 12:28 PM

## 2022-07-24 ENCOUNTER — Ambulatory Visit: Payer: BC Managed Care – PPO

## 2022-07-24 ENCOUNTER — Ambulatory Visit
Admit: 2022-07-24 | Discharge: 2022-07-24 | Disposition: A | Discharge status: Home / Self Care | Payer: BC Managed Care – PPO | Attending: Radiation Oncology | Admitting: Radiation Oncology

## 2022-07-24 ENCOUNTER — Other Ambulatory Visit: Payer: Self-pay

## 2022-07-24 DIAGNOSIS — G40901 Epilepsy, unspecified, not intractable, with status epilepticus: Secondary | ICD-10-CM | POA: Diagnosis not present

## 2022-07-24 LAB — RAD ONC ARIA SESSION SUMMARY
Course Elapsed Days: 2
Plan Fractions Treated to Date: 3
Plan Prescribed Dose Per Fraction: 3 Gy
Plan Total Fractions Prescribed: 10
Plan Total Prescribed Dose: 30 Gy
Reference Point Dosage Given to Date: 9 Gy
Reference Point Session Dosage Given: 3 Gy
Session Number: 3

## 2022-07-24 LAB — GLUCOSE, CAPILLARY
Glucose-Capillary: 147 mg/dL — ABNORMAL HIGH (ref 70–99)
Glucose-Capillary: 251 mg/dL — ABNORMAL HIGH (ref 70–99)
Glucose-Capillary: 175 mg/dL — ABNORMAL HIGH (ref 70–99)
Glucose-Capillary: 211 mg/dL — ABNORMAL HIGH (ref 70–99)

## 2022-07-24 LAB — HEPARIN INDUCED PLATELET AB (HIT ANTIBODY): Heparin Induced Plt Ab: 0.089 OD (ref 0.000–0.400)

## 2022-07-24 MED ORDER — LACOSAMIDE 50 MG PO TABS
200.0000 mg | ORAL_TABLET | Freq: Two times a day (BID) | ORAL | Status: DC
  Administered 2022-07-24 – 2022-08-02 (×18): 200 mg via ORAL
  Filled 2022-07-24 (×19): qty 4

## 2022-07-24 MED ORDER — ENOXAPARIN SODIUM 40 MG/0.4ML IJ SOSY
40.0000 mg | PREFILLED_SYRINGE | INTRAMUSCULAR | Status: DC
  Administered 2022-07-24 – 2022-08-02 (×10): 40 mg via SUBCUTANEOUS
  Filled 2022-07-24 (×10): qty 0.4

## 2022-07-24 NOTE — Progress Notes (Addendum)
Consultation Progress Note   Patient: Pamela [name] [id number] DOB: 1961-03-17 DOA: 07/16/2022 DOS: the patient was seen and examined on 07/24/2022 Primary service: Hriday Stai, Manfred Shirts, MD  Brief hospital course: 61 year old woman who presented to [facility] ED 10/30 via EMS after being found down at home with seizure-like activity. LKW 2040, found around 2245 having L-sided partial seizures and hyperglycemic to 300s. Recent transient spontaenously-resolving episodes of slurred speech at home per family. PMHx significant for HTN, T2DM, hypothyroidism, SCLC (s/p chemotherapy, followed by Dr. Marin Olp) with known liver metastasis and recently diagnosed brain metastasis with plan for whole brain radiation (not yet started).    On EMS arrival, patient was reportedly mostly unresponsive with ongoing L-sided partial seizure activity. Versed IV (58m total) administered without cessation of seizure. Labs were notable for WBC 11.6, H&H/Plt WNL, hyponatremia with Na 126, K 6.2, serum glucose 836, BUN 49, Cr 1.49 (baseline 0.9). LFTs elevated with AST/ALT 76/110, Alk Phos 226. INR WNL. UA with > 500 glucose; UDS +BZD (s/p Ativan/Versed). Ethanol negative. [facility]; Propofol gtt + Versed initiated. Neuro consulted; rapid EEG completed. Keppra load given + fosphenytoin. CT Head with multiple hyperdense/hemorrhagic metastases (largest 2.6cm, unchanged) with edema of R frontal and parietal/occipital lobes. CT C-spine negative.    [facility] TRH to assume care 07/23/2022   Assessment & Plan:    Status epilepticus (HM[place]  - [facility]  [facility] -> last focal seiure 07/20/22 eeg - ileptogenicity and cortical dysfunction arising from right temporoparietal region likely secondary to underlying mass. Additionally there was mild to moderate diffuse encephalopathy,  nonspecific etiology. No seizures were seen during this study. 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    Thrombocytopenia  - 4Ts score low probability for HIT    - improving   - check HITT panel 07/22/22 to be on safe side  - will nonetheless hold sq heparin in setting of bleeding CNS metastases but can start once platelet >= 100K +/- basedo HITT antibody test   FENT: Diet/type: NPO  DVT prophylaxis: SCD - checking HITT   GI prophylaxis: PPI  Lines: Port, R upper chest Foley:  Yes, and it is still needed Code Status:  full code  Last date of multidisciplinary goals of care discussion [family updated 11/3, palliative care consutled. Difficult scenario, would still want to pursue elective intubation for more aggressive seizure control if reloading AEDs unsuccessful]   Patient an dfmaily update 07/22/2022    Reviewed oncology notes, appreciate input.  Plan is for patient to remain in the hospital till Friday [facility]  Subjective: Seen this morning with daughter-in-law at bedside.  Patient has no complaints.  She tolerated radiation therapy today.  Physical Exam: Constitution:  Alert, cooperative, no distress,  Appears calm and comfortable  Psychiatric:   Normal and stable mood and affect, cognition intact,    HEENT:        Normocephalic, PERRL, otherwise with in Normal limits  Chest:         Chest symmetric Cardio vascular:  S1/S2, RRR, No murmure, No Rubs or Gallops  pulmonary: Clear to auscultation bilaterally, respirations unlabored, negative wheezes / crackles Abdomen: Soft, non-tender, non-distended, bowel sounds,no masses, no organomegaly Muscular skeletal: Limited exam - in bed, able to move all 4 extremities,   Global generalized weaknesses Neuro: CNII-XII intact. , normal motor and sensation, reflexes intact  Extremities: No pitting edema lower extremities, +2 pulses  Skin: Dry, warm to touch, negative for any Rashes, No open wounds Wounds: per nursing documentation Vitals:   07/23/22 1033 07/23/22 1933 07/24/22 0535 07/24/22 1138  BP: (!) 159/85 (!) 141/56 (!) 129/49 (!) 124/55  Pulse: 78 78 76 79  Resp: _0 Temp: 97.8 F (36.6 C) 97.7 F (36.5 C) 97.6 F (36.4 C) 98.7 F (37.1 C)  TempSrc: Oral Oral  Oral  SpO2: 95% 95% 97% 97%  Weight:      Height:        Data Reviewed:  There are no new results to review at this time.  Family Communication: Daughter in law  Time spent: 15 minutes.  Author: Cristela Felt, MD 07/24/2022 2:26 PM  For on call review [email or web address].

## 2022-07-24 NOTE — Progress Notes (Signed)
Ms. Bellville really looks great.  She is back to her old self.  She really has had no complaints.  She has had no problems with radiation therapy.  I know she has only had 2' treatments to date.  However, she is very pleased with how well things are going.  She has a good appetite.  She has had no nausea or vomiting.  She has had no problems with bowels or bladder.  She has had no bleeding.  There is been no fever.  She has had no cough or shortness of breath.  She is out of bed.  I think she is getting some physical therapy.  There are no labs yet today.  I will make sure she gets labs tomorrow.  Her vital signs are temperature 97.6.  Pulse 76.  Blood pressure 129/49.  Her head neck exam shows no thrush.  She has no ocular or oral lesions.  Pupils react appropriately.  Lungs are clear bilaterally.  She has good air movement bilaterally.  Cardiac exam regular rate and rhythm.  Abdomen is soft.  Bowel sounds are present.  There is no fluid wave.  There is no abdominal mass.  There is no palpable liver or spleen tip.  Extremity shows no clubbing, cyanosis or edema. Neurological exam is nonfocal.  She is alert and oriented.  I do think she needs to be on Lovenox.  I think prophylactic Lovenox would have no issues with respect to her CNS disease.  She is going to have 40 mg a day.  However, I do think that she is at risk for thromboembolic disease given that she does have metastatic disease, is not all that active.  She is on I think 3 different seizure medications.  Neurology has been following this.  I am unsure how long she would be in the hospital for her radiation.  I would think that if she does well this week, maybe she can have her radiation moved over to [facility] next week.  I am just happy that she has recovered as well as she has.  [name], MD  Oswaldo Milian 41:10

## 2022-07-24 NOTE — Progress Notes (Signed)
Physical Therapy Treatment Patient Details Name: Pamela [name] MRN: [id number] DOB: October 08, 1960 Today's Date: 07/24/2022   History of Present Illness 61 y.o. female presents to [facility] hospital on 07/16/2022 after being found down at home with seizure-like activity, hyperglycemic to 300s. Pt required intubation on arrival to ED. CT Head with multiple hyperdense/hemorrhagic metastases (some known already). Extubated 11/2, further seizure-like activity 11/3. PMH includes HTN, DMII, hypothyroidism, SCLC with liver and brain mets.    PT Comments    Pamela [name] is progressing with PT. Incr amb distance, agreeable to RW use today with notable improvement in gait stability. Continues to require directional assist with RW, slow deliberate gait. occasional redirection to task but follows commands consistently. Pt will benefit from post acute rehab/AIR.   Recommendations for follow up therapy are one component of a multi-disciplinary discharge planning process, led by the attending physician.  Recommendations may be updated based on patient status, additional functional criteria and insurance authorization.  Follow Up Recommendations  Acute inpatient rehab (3hours/day)     Assistance Recommended at Discharge Intermittent Supervision/Assistance  Patient can return home with the following A lot of help with walking and/or transfers;A lot of help with bathing/dressing/bathroom;Assistance with cooking/housework;Direct supervision/assist for medications management;Direct supervision/assist for financial management;Assist for transportation;Help with stairs or ramp for entrance   Equipment Recommendations  Rolling walker (2 wheels)    Recommendations for Other Services       Precautions / Restrictions Precautions Precautions: Fall Restrictions Weight Bearing Restrictions: No     Mobility  Bed Mobility               General bed mobility comments: pt in recliner    Transfers Overall transfer level:  Needs assistance Equipment used: None Transfers: Sit to/from Stand Sit to Stand: Min guard           General transfer comment: min/guard for safety, reliant on UE assist, STS without device    Ambulation/Gait Ambulation/Gait assistance: Min assist Gait Distance (Feet): 70 Feet Assistive device: Rolling walker (2 wheels) Gait Pattern/deviations: Step-through pattern, Decreased stride length, Decreased dorsiflexion - right, Decreased dorsiflexion - left, Drifts right/left Gait velocity: reduced     General Gait Details: pt requires directional assist with RW and multi-modal cues for RW position, trunk extension, upward gaze and incr df bil.  pt is steady with RW, drifts R/L but without overt LOB   Stairs             Wheelchair Mobility    Modified Rankin (Stroke Patients Only)       Balance                                            Cognition Arousal/Alertness: Awake/alert Behavior During Therapy: WFL for tasks assessed/performed Overall Cognitive Status: Impaired/Different from baseline Area of Impairment: Safety/judgement, Problem solving                     Memory: Decreased short-term memory   Safety/Judgement: Decreased awareness of safety, Decreased awareness of deficits   Problem Solving: Slow processing, Requires verbal cues General Comments: following commands consistently with incr time        Exercises General Exercises - Lower Extremity Ankle Circles/Pumps: AROM, Both, 10 reps Long Arc Quad: AROM, Both, 10 reps, Seated Hip Flexion/Marching: AROM, Both, 20 reps, Seated    General Comments  Pertinent Vitals/Pain Pain Assessment Pain Assessment: No/denies pain    Home Living                          Prior Function            PT Goals (current goals can now be found in the care plan section) Acute Rehab PT Goals PT Goal Formulation: With patient Time For Goal Achievement:  08/03/22 Potential to Achieve Goals: Fair Progress towards PT goals: Progressing toward goals    Frequency    Min 3X/week      PT Plan Current plan remains appropriate    Co-evaluation              AM-PAC PT "6 Clicks" Mobility   Outcome Measure  Help needed turning from your back to your side while in a flat bed without using bedrails?: A Little Help needed moving from lying on your back to sitting on the side of a flat bed without using bedrails?: A Little Help needed moving to and from a bed to a chair (including a wheelchair)?: A Little Help needed standing up from a chair using your arms (e.g., wheelchair or bedside chair)?: A Little Help needed to walk in hospital room?: A Lot Help needed climbing 3-5 steps with a railing? : Total 6 Click Score: 15    End of Session Equipment Utilized During Treatment: Gait belt Activity Tolerance: Patient tolerated treatment well Patient left: in chair;with call bell/phone within reach;with chair alarm set Nurse Communication: Mobility status PT Visit Diagnosis: Other abnormalities of gait and mobility (R26.89)     Time: 1140-1158 PT Time Calculation (min) (ACUTE ONLY): 18 min  Charges:  $Gait Training: 8-22 mins                     Pamela [name], PT  Acute Rehab Dept [facility]) [phone]  [facility] Weekend Pager (Saturday/Sunday only)  [phone]  07/24/2022    [facility] 07/24/2022, 12:26 PM

## 2022-07-25 ENCOUNTER — Other Ambulatory Visit: Payer: Self-pay

## 2022-07-25 ENCOUNTER — Ambulatory Visit
Admit: 2022-07-25 | Discharge: 2022-07-25 | Disposition: A | Discharge status: Home / Self Care | Payer: BC Managed Care – PPO | Attending: Radiation Oncology | Admitting: Radiation Oncology

## 2022-07-25 ENCOUNTER — Ambulatory Visit: Payer: BC Managed Care – PPO

## 2022-07-25 DIAGNOSIS — G40901 Epilepsy, unspecified, not intractable, with status epilepticus: Secondary | ICD-10-CM | POA: Diagnosis not present

## 2022-07-25 LAB — CBC WITH DIFFERENTIAL/PLATELET
Abs Immature Granulocytes: 0.18 10*3/uL — ABNORMAL HIGH (ref 0.00–0.07)
Basophils Absolute: 0 10*3/uL (ref 0.0–0.1)
Basophils Relative: 0 %
Eosinophils Absolute: 0 10*3/uL (ref 0.0–0.5)
Eosinophils Relative: 0 %
HCT: 29.1 % — ABNORMAL LOW (ref 36.0–46.0)
Hemoglobin: 9.6 g/dL — ABNORMAL LOW (ref 12.0–15.0)
Immature Granulocytes: 2 %
Lymphocytes Relative: 8 %
Lymphs Abs: 0.7 10*3/uL (ref 0.7–4.0)
MCH: 32.9 pg (ref 26.0–34.0)
MCHC: 33 g/dL (ref 30.0–36.0)
MCV: 99.7 fL (ref 80.0–100.0)
Monocytes Absolute: 0.9 10*3/uL (ref 0.1–1.0)
Monocytes Relative: 11 %
Neutro Abs: 6.2 10*3/uL (ref 1.7–7.7)
Neutrophils Relative %: 79 %
Platelets: 104 10*3/uL — ABNORMAL LOW (ref 150–400)
RBC: 2.92 MIL/uL — ABNORMAL LOW (ref 3.87–5.11)
RDW: 15.9 % — ABNORMAL HIGH (ref 11.5–15.5)
WBC: 8 10*3/uL (ref 4.0–10.5)
nRBC: 0 % (ref 0.0–0.2)

## 2022-07-25 LAB — RAD ONC ARIA SESSION SUMMARY
Course Elapsed Days: 3
Plan Fractions Treated to Date: 4
Plan Prescribed Dose Per Fraction: 3 Gy
Plan Total Fractions Prescribed: 10
Plan Total Prescribed Dose: 30 Gy
Reference Point Dosage Given to Date: 12 Gy
Reference Point Session Dosage Given: 3 Gy
Session Number: 4

## 2022-07-25 LAB — COMPREHENSIVE METABOLIC PANEL
ALT: 67 U/L — ABNORMAL HIGH (ref 0–44)
AST: 45 U/L — ABNORMAL HIGH (ref 15–41)
Albumin: 2.9 g/dL — ABNORMAL LOW (ref 3.5–5.0)
Alkaline Phosphatase: 215 U/L — ABNORMAL HIGH (ref 38–126)
Anion gap: 9 (ref 5–15)
BUN: 13 mg/dL (ref 8–23)
CO2: 26 mmol/L (ref 22–32)
Calcium: 9 mg/dL (ref 8.9–10.3)
Chloride: 102 mmol/L (ref 98–111)
Creatinine, Ser: 0.6 mg/dL (ref 0.44–1.00)
GFR, Estimated: >60 mL/min (ref >60)
Glucose, Bld: 143 mg/dL — ABNORMAL HIGH (ref 70–99)
Potassium: 3.3 mmol/L — ABNORMAL LOW (ref 3.5–5.1)
Sodium: 137 mmol/L (ref 135–145)
Total Bilirubin: 0.7 mg/dL (ref 0.3–1.2)
Total Protein: 6.8 g/dL (ref 6.5–8.1)

## 2022-07-25 LAB — GLUCOSE, CAPILLARY
Glucose-Capillary: 153 mg/dL — ABNORMAL HIGH (ref 70–99)
Glucose-Capillary: 238 mg/dL — ABNORMAL HIGH (ref 70–99)
Glucose-Capillary: 138 mg/dL — ABNORMAL HIGH (ref 70–99)
Glucose-Capillary: 151 mg/dL — ABNORMAL HIGH (ref 70–99)

## 2022-07-25 NOTE — Progress Notes (Signed)
I must say that Pamela [name] is doing quite well.  She really looks like she is back to her baseline.  She is having no problems with radiation therapy.  There has been no seizures.  She is on 3 different seizure medications.  It would be nice if she might be able to come off 1 of these.  I will have to think that Neurology would be the ones to make that decision.  She is walking.  She is eating.  I appreciate Physical Therapy helping her out.  She had a good day yesterday with PT.  She has had no problems with bowels or bladder.  She had no incontinence.  There is been no diarrhea.  She has had no bleeding.  Her labs show a white count of 8.  Hemoglobin 9.6.  Platelet 104,000.  Her BUN is 13 creatinine 0.6.  Her SGPT is 67 SGOT is 45.  Albumin is 2.9.  She has had no fever.  There is been no cough or shortness of breath.  She has had no nausea or vomiting.  Her vital signs show temperature of 98.8.  Pulse 70.  Blood pressure 123/41.  Her head and neck exam shows no scleral icterus.  She has no oral lesions.  There is no adenopathy in the neck.  Lungs are clear.  She has good air movement bilaterally.  There is no wheezes.  Cardiac exam regular rate and rhythm.  I cannot hear a murmur.  Abdomen is soft.  Bowel sounds are present.  There is no guarding or rebound tenderness.  There is maybe a palpable liver edge at the right costal margin.  Extremities shows no clubbing, cyanosis or edema.  She has decent strength in upper and lower extremities bilaterally.  Neurological exam shows no focal deficits.  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  She has disease in the brain.  She had seizures.  She is getting radiation right now.  She will continue with the radiation therapy.  I would think that if she is doing well with radiation this week, and maybe she can have her radiation done over at [facility].  I am sure that Radiation Oncology can arrange with Radiation Oncology at [facility] for a  transfer of care.  It would be a whole lot easier for Pamela [name] and her family since they live in [place], close to the hospital.  I do appreciate everybody's help with Pamela [name].  She really has come along quite nicely in the past week.   [name], MD  Hebrews 4:16

## 2022-07-25 NOTE — Progress Notes (Signed)
Consultation Progress Note   Patient: Pamela [name] [id number] DOB: March 18, 1961 DOA: 07/16/2022 DOS: the patient was seen and examined on 07/25/2022 Primary service: Jeshurun Oaxaca, Manfred Shirts, MD  Brief hospital course: 61 year old woman who presented to [facility] ED 10/30 via EMS after being found down at home with seizure-like activity. LKW 2040, found around 2245 having L-sided partial seizures and hyperglycemic to 300s. Recent transient spontaenously-resolving episodes of slurred speech at home per family. PMHx significant for HTN, T2DM, hypothyroidism, SCLC (s/p chemotherapy, followed by Dr. Marin Olp) with known liver metastasis and recently diagnosed brain metastasis with plan for whole brain radiation (not yet started).    On EMS arrival, patient was reportedly mostly unresponsive with ongoing L-sided partial seizure activity. Versed IV (57m total) administered without cessation of seizure. Labs were notable for WBC 11.6, H&H/Plt WNL, hyponatremia with Na 126, K 6.2, serum glucose 836, BUN 49, Cr 1.49 (baseline 0.9). LFTs elevated with AST/ALT 76/110, Alk Phos 226. INR WNL. UA with > 500 glucose; UDS +BZD (s/p Ativan/Versed). Ethanol negative. [facility]; Propofol gtt + Versed initiated. Neuro consulted; rapid EEG completed. Keppra load given + fosphenytoin. CT Head with multiple hyperdense/hemorrhagic metastases (largest 2.6cm, unchanged) with edema of R frontal and parietal/occipital lobes. CT C-spine negative.    [facility] TRH to assume care 07/23/2022   Assessment & Plan:    Status epilepticus ([place]  - Likely [facility]  [facility] -> last focal seiure 07/20/22 eeg - ileptogenicity and cortical dysfunction arising from right temporoparietal region likely secondary to underlying mass. Additionally there was mild to moderate diffuse encephalopathy,  nonspecific etiology. No seizures were seen during this study. 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    Thrombocytopenia  - 4Ts score low probability for HIT    - improving   - check HITT panel 07/22/22 to be on safe side  - will nonetheless hold sq heparin in setting of bleeding CNS metastases but can start once platelet >= 100K +/- basedo HITT antibody test   FENT: Diet/type: NPO  DVT prophylaxis: SCD - checking HITT   GI prophylaxis: PPI  Lines: Port, R upper chest Foley:  Yes, and it is still needed Code Status:  full code  Last date of multidisciplinary goals of care discussion [family updated 11/3, palliative care consutled. Difficult scenario, would still want to pursue elective intubation for more aggressive seizure control if reloading AEDs unsuccessful]   Patient an dfmaily update 07/22/2022          TRH will continue to follow the patient.  Subjective: No acute complaints this morning.  No notes of seizures overnight.  Going for radiotherapy this morning.  Physical Exam: Constitution:  Alert, cooperative, no distress,  Appears calm  and comfortable  Psychiatric:   Normal and stable mood and affect, cognition intact,   HEENT:        Normocephalic, PERRL, otherwise with in Normal limits  Chest:         Chest symmetric Cardio vascular:  S1/S2, RRR, No murmure, No Rubs or Gallops  pulmonary:  Clear to auscultation bilaterally, respirations unlabored, negative wheezes / crackles Abdomen: Soft, non-tender, non-distended, bowel sounds,no masses, no organomegaly Muscular skeletal: Limited exam - in bed, able to move all 4 extremities,   Global generalized weaknesses Neuro: CNII-XII intact. , normal motor and sensation, reflexes intact  Extremities: No pitting edema lower extremities, +2 pulses  Skin: Dry, warm to touch, negative for any Rashes, No open wounds Wounds: per nursing documentation Vitals:   07/24/22 1138 07/24/22 2141 07/25/22 0555 07/25/22 0800  BP: (!) 124/55 (!) 140/58 (!) 123/41 (!) 141/54  Pulse: 79 75 70 69  Resp: _0 Temp: 98.7 F (37.1 C) 98.5 F (36.9 C) 98.8 F (37.1 C) 98 F (36.7 C)  TempSrc: Oral Oral Oral Oral  SpO2: 97% 95% 98% 95%  Weight:   82.6 kg   Height:        Data Reviewed:  There are no new results to review at this time.  Family Communication:   Time spent: 15 minutes.  Author: Cristela Felt, MD 07/25/2022 12:17 PM  For on call review [email or web address].

## 2022-07-25 NOTE — Progress Notes (Signed)
  Inpatient Rehabilitation Admissions Coordinator   Patient making good progress with therapy as she continues with her radiation. She likely can progress to discharge home with family support. I have ordered OT consult to assess. I have not placed a rehab consult at this time as she likely can progress home and no beds available this week for this patient.   Danne Baxter, RN, MSN Rehab Admissions Coordinator [phone] 07/25/2022 3:34 PM

## 2022-07-25 NOTE — Plan of Care (Signed)
  Problem: Coping: Goal: Ability to adjust to condition or change in health will improve Outcome: Progressing   Problem: Health Behavior/Discharge Planning: Goal: Ability to manage health-related needs will improve Outcome: Progressing   Problem: Skin Integrity: Goal: Risk for impaired skin integrity will decrease Outcome: Progressing   Problem: Coping: Goal: Level of anxiety will decrease Outcome: Progressing   Problem: Safety: Goal: Ability to remain free from injury will improve Outcome: Progressing

## 2022-07-26 ENCOUNTER — Other Ambulatory Visit: Payer: Self-pay

## 2022-07-26 ENCOUNTER — Ambulatory Visit: Payer: BC Managed Care – PPO

## 2022-07-26 ENCOUNTER — Encounter: Payer: Self-pay | Admitting: Family

## 2022-07-26 ENCOUNTER — Ambulatory Visit
Admit: 2022-07-26 | Discharge: 2022-07-26 | Disposition: A | Discharge status: Home / Self Care | Payer: BC Managed Care – PPO | Attending: Radiation Oncology | Admitting: Radiation Oncology

## 2022-07-26 ENCOUNTER — Encounter: Payer: Self-pay | Admitting: *Deleted

## 2022-07-26 DIAGNOSIS — G40901 Epilepsy, unspecified, not intractable, with status epilepticus: Secondary | ICD-10-CM | POA: Diagnosis not present

## 2022-07-26 LAB — CBC WITH DIFFERENTIAL/PLATELET
Abs Immature Granulocytes: 0.17 10*3/uL — ABNORMAL HIGH (ref 0.00–0.07)
Basophils Absolute: 0 10*3/uL (ref 0.0–0.1)
Basophils Relative: 0 %
Eosinophils Absolute: 0 10*3/uL (ref 0.0–0.5)
Eosinophils Relative: 0 %
HCT: 28.3 % — ABNORMAL LOW (ref 36.0–46.0)
Hemoglobin: 9.2 g/dL — ABNORMAL LOW (ref 12.0–15.0)
Immature Granulocytes: 2 %
Lymphocytes Relative: 5 %
Lymphs Abs: 0.4 10*3/uL — ABNORMAL LOW (ref 0.7–4.0)
MCH: 32.3 pg (ref 26.0–34.0)
MCHC: 32.5 g/dL (ref 30.0–36.0)
MCV: 99.3 fL (ref 80.0–100.0)
Monocytes Absolute: 1 10*3/uL (ref 0.1–1.0)
Monocytes Relative: 12 %
Neutro Abs: 6.6 10*3/uL (ref 1.7–7.7)
Neutrophils Relative %: 81 %
Platelets: 100 10*3/uL — ABNORMAL LOW (ref 150–400)
RBC: 2.85 MIL/uL — ABNORMAL LOW (ref 3.87–5.11)
RDW: 15.8 % — ABNORMAL HIGH (ref 11.5–15.5)
WBC: 8.2 10*3/uL (ref 4.0–10.5)
nRBC: 0 % (ref 0.0–0.2)

## 2022-07-26 LAB — RAD ONC ARIA SESSION SUMMARY
Course Elapsed Days: 4
Plan Fractions Treated to Date: 5
Plan Prescribed Dose Per Fraction: 3 Gy
Plan Total Fractions Prescribed: 10
Plan Total Prescribed Dose: 30 Gy
Reference Point Dosage Given to Date: 15 Gy
Reference Point Session Dosage Given: 3 Gy
Session Number: 5

## 2022-07-26 LAB — COMPREHENSIVE METABOLIC PANEL
ALT: 58 U/L — ABNORMAL HIGH (ref 0–44)
AST: 45 U/L — ABNORMAL HIGH (ref 15–41)
Albumin: 2.7 g/dL — ABNORMAL LOW (ref 3.5–5.0)
Alkaline Phosphatase: 192 U/L — ABNORMAL HIGH (ref 38–126)
Anion gap: 9 (ref 5–15)
BUN: 9 mg/dL (ref 8–23)
CO2: 27 mmol/L (ref 22–32)
Calcium: 8.8 mg/dL — ABNORMAL LOW (ref 8.9–10.3)
Chloride: 98 mmol/L (ref 98–111)
Creatinine, Ser: 0.61 mg/dL (ref 0.44–1.00)
GFR, Estimated: >60 mL/min (ref >60)
Glucose, Bld: 180 mg/dL — ABNORMAL HIGH (ref 70–99)
Potassium: 2.9 mmol/L — ABNORMAL LOW (ref 3.5–5.1)
Sodium: 134 mmol/L — ABNORMAL LOW (ref 135–145)
Total Bilirubin: 0.9 mg/dL (ref 0.3–1.2)
Total Protein: 6.4 g/dL — ABNORMAL LOW (ref 6.5–8.1)

## 2022-07-26 LAB — GLUCOSE, CAPILLARY
Glucose-Capillary: 179 mg/dL — ABNORMAL HIGH (ref 70–99)
Glucose-Capillary: 193 mg/dL — ABNORMAL HIGH (ref 70–99)
Glucose-Capillary: 145 mg/dL — ABNORMAL HIGH (ref 70–99)
Glucose-Capillary: 118 mg/dL — ABNORMAL HIGH (ref 70–99)

## 2022-07-26 LAB — POTASSIUM: Potassium: 3.7 mmol/L (ref 3.5–5.1)

## 2022-07-26 MED ORDER — DEXAMETHASONE 4 MG PO TABS
4.0000 mg | ORAL_TABLET | Freq: Three times a day (TID) | ORAL | Status: DC
  Administered 2022-07-26 – 2022-07-30 (×12): 4 mg via ORAL
  Filled 2022-07-26 (×12): qty 1

## 2022-07-26 MED ORDER — POTASSIUM CHLORIDE CRYS ER 20 MEQ PO TBCR
40.0000 meq | EXTENDED_RELEASE_TABLET | Freq: Three times a day (TID) | ORAL | Status: DC
  Administered 2022-07-26 – 2022-08-02 (×21): 40 meq via ORAL
  Filled 2022-07-26 (×21): qty 2

## 2022-07-26 NOTE — Progress Notes (Signed)
Patient continues to be hospitalized. She's getting daily radiation at [facility]. Dr Marin Olp is hoping that she can discharged soon and get her daily radiation at [facility], which is closer to home. Will continue to follow for post discharge needs and office follow up.   Oncology Nurse Navigator Documentation     07/26/2022    8:00 AM  Oncology Nurse Navigator Flowsheets  Navigator Follow Up Date: 07/30/2022  Navigator Follow Up Reason: Appointment Review  Navigator Location [facility]  Navigator Encounter Type Appt/Treatment Plan Review  Patient Visit Type MedOnc  Treatment Phase Active Tx  Barriers/Navigation Needs Coordination of Care;Education  Interventions None Required  Acuity Level 2-Minimal Needs (1-2 Barriers Identified)  Support Groups/Services Friends and Family  Time Spent with Patient 15

## 2022-07-26 NOTE — Progress Notes (Signed)
Consultation Progress Note   Patient: Pamela [name] [id number] DOB: [birth year] DOA: 07/16/2022 DOS: the patient was seen and examined on 07/26/2022 Primary service: Rhian Funari, Manfred Shirts, MD  Brief hospital course: 61 year old woman who presented to [facility] ED 10/30 via EMS after being found down at home with seizure-like activity. LKW 2040, found around 2245 having L-sided partial seizures and hyperglycemic to 300s. Recent transient spontaenously-resolving episodes of slurred speech at home per family. PMHx significant for HTN, T2DM, hypothyroidism, SCLC (s/p chemotherapy, followed by Dr. Marin Olp) with known liver metastasis and recently diagnosed brain metastasis with plan for whole brain radiation (not yet started).    On EMS arrival, patient was reportedly mostly unresponsive with ongoing L-sided partial seizure activity. Versed IV (39m total) administered without cessation of seizure. Labs were notable for WBC 11.6, H&H/Plt WNL, hyponatremia with Na 126, K 6.2, serum glucose 836, BUN 49, Cr 1.49 (baseline 0.9). LFTs elevated with AST/ALT 76/110, Alk Phos 226. INR WNL. UA with > 500 glucose; UDS +BZD (s/p Ativan/Versed). Ethanol negative. [facility]; Propofol gtt + Versed initiated. Neuro consulted; rapid EEG completed. Keppra load given + fosphenytoin. CT Head with multiple hyperdense/hemorrhagic metastases (largest 2.6cm, unchanged) with edema of R frontal and parietal/occipital lobes. CT C-spine negative.    [facility] TRH to assume care 07/23/2022   Assessment & Plan:    Status epilepticus ([place]  - [facility]  [facility] -> last focal seiure 07/20/22 eeg - ileptogenicity and cortical dysfunction arising from right temporoparietal region likely secondary to underlying mass. Additionally there was mild to moderate diffuse encephalopathy,  nonspecific etiology. No seizures were seen during this study. 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    Thrombocytopenia  - 4Ts score low probability for HIT    - improving   - check HITT panel 07/22/22 to be on safe side  - will nonetheless hold sq heparin in setting of bleeding CNS metastases but can start once platelet >= 100K +/- basedo HITT antibody test   FENT: Diet/type: NPO  DVT prophylaxis: SCD - checking HITT   GI prophylaxis: PPI  Lines: Port, R upper chest Foley:  Yes, and it is still needed Code Status:  full code  Last date of multidisciplinary goals of care discussion [family updated 11/3, palliative care consutled. Difficult scenario, would still want to pursue elective intubation for more aggressive seizure control if reloading AEDs unsuccessful]   Patient an dfmaily update 07/22/2022    Assessment and Plan: No notes have been filed under this hospital service. Service: Hospitalist       TRH will continue to follow the patient.  Subjective: No complaints this morning  Physical Exam: Consultation Progress Note    Patient: Pamela  [name] [id number] DOB: 04/16/1961 DOA: 07/16/2022 DOS: the patient was seen and examined on 07/25/2022 Primary service: Lela Murfin, Manfred Shirts, MD   Brief hospital course: 61 year old woman who [facility] after being found down at  home with seizure-like activity. LKW 2040, found around 2245 having L-sided partial seizures and hyperglycemic to 300s. Recent transient spontaenously-resolving episodes of slurred speech at home per family. PMHx significant for HTN, T2DM, hypothyroidism, SCLC (s/p chemotherapy, followed by Dr. Marin Olp) with known liver metastasis and recently diagnosed brain metastasis with plan for whole brain radiation (not yet started).    On EMS arrival, patient was reportedly mostly unresponsive with ongoing L-sided partial seizure activity. Versed IV (103m total) administered without cessation of seizure. Labs were notable for WBC 11.6, H&H/Plt WNL, hyponatremia with Na 126, K 6.2, serum glucose 836, BUN 49, Cr 1.49 (baseline 0.9). LFTs elevated with AST/ALT 76/110, Alk Phos 226. INR WNL. UA with > 500 glucose; UDS +BZD (s/p Ativan/Versed). Ethanol negative. [facility]; Propofol gtt + Versed initiated. Neuro consulted; rapid EEG completed. Keppra load given + fosphenytoin. CT Head with multiple hyperdense/hemorrhagic metastases (largest 2.6cm, unchanged) with edema of R frontal and parietal/occipital lobes. CT C-spine negative.    [facility] TRH to assume care 07/23/2022     Assessment & Plan:    Status epilepticus ([place]  - [facility]  [facility] -> last focal seiure 07/20/22 eeg - ileptogenicity and cortical dysfunction arising from right temporoparietal region likely secondary to underlying mass. Additionally there was mild to moderate diffuse encephalopathy, nonspecific etiology. No seizures were seen during this study. Currently no seizures  - AEDs per neuro - start XRT per onc - if develops convulsive status despite current AED regimen again may need elective intubation  - palliative care following Plan -Per Oncology patient could be discharged  over the weekend. -[facility]  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    Thrombocytopenia  - 4Ts score low probability for HIT    - improving   - check HITT panel 07/22/22 to be on safe side  - will nonetheless hold sq heparin in setting of bleeding CNS metastases but can start once platelet >= 100K +/- basedo HITT antibody test   FENT: Diet/type: NPO   DVT prophylaxis: SCD - checking HITT    GI prophylaxis: PPI   Lines: Port, R upper chest Foley:  Yes, and it is still needed Code Status:  full code   Last date of multidisciplinary goals of care discussion [family updated 11/3, palliative care consutled. Difficult scenario, would still want to pursue elective intubation for more aggressive seizure control if reloading AEDs unsuccessful]   Patient an dfmaily update 07/22/2022  TRH will continue to follow the patient.   Subjective: No acute complaints this morning.  No notes of seizures overnight.  Going for radiotherapy this morning.   Physical Exam: Constitution:  Alert, cooperative, no distress,  Appears calm and comfortable  Psychiatric:   Normal and stable mood and affect, cognition intact,   HEENT:        Normocephalic, PERRL, otherwise with in Normal limits  Chest:         Chest symmetric Cardio vascular:  S1/S2, RRR, No murmure, No Rubs or Gallops  pulmonary: Clear to auscultation bilaterally, respirations unlabored, negative  wheezes / crackles Abdomen: Soft, non-tender, non-distended, bowel sounds,no masses, no organomegaly Muscular skeletal: Limited exam - in bed, able to move all 4 extremities,   Global generalized weaknesses Neuro: CNII-XII intact. , normal motor and sensation, reflexes intact  Extremities: No pitting edema lower extremities, +2 pulses  Skin: Dry, warm to touch, negative for any Rashes, No open wounds Wounds: per nursing documentation       Vitals:     Vitals:   07/25/22 1221 07/25/22 1348 07/25/22 2050 07/26/22 0524  BP: (!) 123/57 (!) 141/63 (!) 132/46 (!) 138/52  Pulse: 80 84 80 85  Resp: _0 Temp: 98.3 F (36.8 C) 98.8 F (37.1 C) 99.1 F (37.3 C) 98.9 F (37.2 C)  TempSrc: Oral Oral Oral Oral  SpO2: 96% 95% 97% 92%  Weight:      Height:        Data Reviewed:  There are no new results to review at this time.  Family Communication:   Time spent: 15 minutes.  Author: Cristela Felt, MD 07/26/2022 10:49 AM  For on call review [email or web address].

## 2022-07-26 NOTE — Progress Notes (Signed)
Pamela [name] is still doing well.  She has had no problems neurologically.  She is doing well with radiation therapy.  She is having no problems with headaches.  There is no visual changes.  She is eating well.  There is no nausea or vomiting.  Her labs show sodium 134.  Potassium is low at 2.9.  BUN 9 creatinine 0.61.  Calcium is 8.8 with an albumin of 2.7.  Her white cell count is 8.2.  Hemoglobin 9.2.  Platelet count is 100,000.  She is having no problems with bowels or bladder.  She is having no incontinence.  There is no bleeding.  There is no leg swelling.  She does not complain of any pain.  Her vital signs show temperature of 98.9.  Pulse 85.  Blood pressure 138/52.  Her head and neck exam shows no ocular or oral lesions.  She has no adenopathy in the neck.  Lungs are clear bilaterally.  She has good air movement bilaterally.  Cardiac exam regular rate and rhythm.  She has no murmurs.  Abdomen is soft.  Bowel sounds are present.  She has no fluid wave.  There is no palpable liver or spleen tip.  Extremity shows no clubbing, cyanosis or edema.  Neurological exam is nonfocal.  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  She has seizures.  She has been seizure-free now for a week.  She is on radiation therapy.  We will go ahead and replace her potassium.  We will do this orally.  I would think that given that she has done well, she might be able to have the radiation therapy done at [facility].  She lives very close to the facility.  It certainly would be a lot easier for she and her family.  Maybe, if all looks okay over the weekend, she can be treated as an outpatient at [facility].  I would think that Radiation Oncology here, would have to call the Radiation Oncology department at [facility] and see about making a transfer of care.  Again we will replace her potassium.  Her hemoglobin is low on the lower side.  I think we can just watch this for right now.  I know she is gotten  incredible care from everybody on [address].  [name], MD  Lurena Joiner 2:7

## 2022-07-26 NOTE — Progress Notes (Signed)
Physical Therapy Treatment Patient Details Name: Pamela [name] MRN: [id number] DOB: [birth year] Today's Date: 07/26/2022   History of Present Illness 61 y.o. female presents to [facility] hospital on 07/16/2022 after being found down at home with seizure-like activity, hyperglycemic to 300s. Pt required intubation on arrival to ED. CT Head with multiple hyperdense/hemorrhagic metastases (some known already). Extubated 11/2, further seizure-like activity 11/3. PMH includes HTN, DMII, hypothyroidism, SCLC with liver and brain mets.    PT Comments    Patient making good progress with mobilty and now requires supervision for transfers and min guard for safety with gait. Pt given scavenger hunt for dual task activity during gait and unable to locate items on Lt side or safely avoid obstacles on Lt. Cues provided for scanning. Peripheral vision grossly assessed in sitting and pt unable to identify number of digits held up on Lt side. Discussed importance of scanning to avoid hitting objects or tripping on items in the Lt visual field. EOS reviewed stair negotiation with pt and she was able ascend/descend curb safely with no LOB and safe guarding from her spouse. Will continue to progress as able, discharge recommendations updated as pt has progressed. No recommending HHPT follow up.    Recommendations for follow up therapy are one component of a multi-disciplinary discharge planning process, led by the attending physician.  Recommendations may be updated based on patient status, additional functional criteria and insurance authorization.  Follow Up Recommendations  Home health PT     Assistance Recommended at Discharge Intermittent Supervision/Assistance  Patient can return home with the following A little help with walking and/or transfers;A little help with bathing/dressing/bathroom;Assistance with cooking/housework;Direct supervision/assist for medications management;Assist for transportation;Help with stairs  or ramp for entrance   Equipment Recommendations  Rolling walker (2 wheels)    Recommendations for Other Services       Precautions / Restrictions Precautions Precautions: Fall Precaution Comments: EEG Restrictions Weight Bearing Restrictions: No     Mobility  Bed Mobility               General bed mobility comments: OOB in recliner    Transfers Overall transfer level: Needs assistance Equipment used: None Transfers: Sit to/from Stand Sit to Stand: Supervision           General transfer comment: supervision for safety. no assist or cues for technique needed.    Ambulation/Gait Ambulation/Gait assistance: Min guard Gait Distance (Feet): 240 Feet Assistive device: Rolling walker (2 wheels), 1 person hand held assist Gait Pattern/deviations: Step-through pattern, Decreased stride length, Decreased dorsiflexion - right, Decreased dorsiflexion - left, Drifts right/left Gait velocity: decr     General Gait Details: pt required cues to direct/aim walker in safe direction to navigate hallway environement. overall gait is slow but steady and smooth with drift to Lt. Pt with poor visual field on Lt and cues to avoid obstacles. Pt provided scavenger hunt for room: 1411 (Rt), soiled utility (Lt), and fire extinguisher (Lt). Pt only found room 1411 without assist and unable to find last 2 items pt unable to find until turned around and now on Rt side.   Stairs Stairs: Yes Stairs assistance: Min guard Stair Management: No rails, Step to pattern, Forwards Number of Stairs: 1 General stair comments: curb negotation completed with RW, pt's spouse present for guarding and no LOB noted. safe step sequence "up with good, down with bad" and no LOB.   Wheelchair Mobility    Modified Rankin (Stroke Patients Only)  Balance Overall balance assessment: Needs assistance Sitting-balance support: No upper extremity supported, Feet supported Sitting balance-Leahy Scale:  Good     Standing balance support: Bilateral upper extremity supported, Single extremity supported, Reliant on assistive device for balance, During functional activity Standing balance-Leahy Scale: Fair                              Cognition Arousal/Alertness: Awake/alert Behavior During Therapy: WFL for tasks assessed/performed Overall Cognitive Status: Impaired/Different from baseline Area of Impairment: Safety/judgement, Problem solving                         Safety/Judgement: Decreased awareness of safety, Decreased awareness of deficits   Problem Solving: Slow processing, Requires verbal cues General Comments: following commands consistently with incr time        Exercises      General Comments General comments (skin integrity, edema, etc.): Visual testing in sitting: pt with loss on Lt peripherally and unable state number of fingers until held directly in front of pt. vision intact on Rt peripherally.      Pertinent Vitals/Pain Pain Assessment Pain Assessment: No/denies pain    Home Living                          Prior Function            PT Goals (current goals can now be found in the care plan section) Acute Rehab PT Goals Patient Stated Goal: to improve mobility quality once off EEG PT Goal Formulation: With patient Time For Goal Achievement: 08/03/22 Potential to Achieve Goals: Fair Progress towards PT goals: Progressing toward goals    Frequency    Min 3X/week      PT Plan Discharge plan needs to be updated    Co-evaluation              AM-PAC PT "6 Clicks" Mobility   Outcome Measure  Help needed turning from your back to your side while in a flat bed without using bedrails?: A Little Help needed moving from lying on your back to sitting on the side of a flat bed without using bedrails?: A Little Help needed moving to and from a bed to a chair (including a wheelchair)?: A Little Help needed standing up  from a chair using your arms (e.g., wheelchair or bedside chair)?: A Little Help needed to walk in hospital room?: A Little Help needed climbing 3-5 steps with a railing? : A Little 6 Click Score: 18    End of Session Equipment Utilized During Treatment: Gait belt Activity Tolerance: Patient tolerated treatment well Patient left: in chair;with call bell/phone within reach;with chair alarm set;with family/visitor present;with nursing/sitter in room Nurse Communication: Mobility status PT Visit Diagnosis: Other abnormalities of gait and mobility (R26.89)     Time: 6226-3335 PT Time Calculation (min) (ACUTE ONLY): 23 min  Charges:  $Gait Training: 23-37 mins                     Verner Mould, DPT Acute Rehabilitation Services Office [phone]  07/26/22 12:26 PM

## 2022-07-26 NOTE — Evaluation (Addendum)
Occupational Therapy Evaluation Patient Details Name: Pamela [name] MRN: [id number] DOB: 11-Oct-1960 Today's Date: 07/26/2022   History of Present Illness 61 y.o. female presents to [facility] hospital on 07/16/2022 after being found down at home with seizure-like activity, hyperglycemic to 300s. Pt required intubation on arrival to ED. CT Head with multiple hyperdense/hemorrhagic metastases (some known already). Extubated 11/2, further seizure-like activity 11/3. PMH includes HTN, DMII, hypothyroidism, SCLC with liver and brain mets.   Clinical Impression   Mrs. Pamela [name] is a 61 year old woman who presents with above medical history. On evaluation she demonstrates normal ROM, strength and coordination of upper extremities. She demonstrates the ability to ambulate in room with intermittent hand holds and without overt loss of balance. She demonstrates the ability to donn LB clothing, stand for grooming, and perform toileting. She requires no physical assistance for ADLs just min guard to supervision for safety. Her cognition is grossly functional - alert and oriented and functional memory. Would need cognitive evaluation if she was to be managing her own medication and finances however. Patient exhibits impaired vision - with some form of left sided deficits. She is able to grossly track but lacks smooth pursuits. She demonstrates difficulty reading - missing intermittent words and unable to keep her place jumping down a couple of lines. With cancellation tasks patient missing letters on the left. She exhibits a disorganized search pattern - and tends to search vertically more so than left to right. Questionable left field cut vs hemianopsia. Recommend neuro -ophthalmologist follow up to assess vision. From an OT standpoint patient has no acute care needs but would recommend from [facility] OT. Would recommend patient have initial intermittent - frequent supervision at home.     Recommendations for follow up therapy  are one component of a multi-disciplinary discharge planning process, led by the attending physician.  Recommendations may be updated based on patient status, additional functional criteria and insurance authorization.   Follow Up Recommendations  Home health OT    Assistance Recommended at Discharge Frequent or constant Supervision/Assistance  Patient can return home with the following Assistance with cooking/housework;Direct supervision/assist for medications management;Direct supervision/assist for financial management;Help with stairs or ramp for entrance    Functional Status Assessment  Patient has had a recent decline in their functional status and demonstrates the ability to make significant improvements in function in a reasonable and predictable amount of time.  Equipment Recommendations  None recommended by OT    Recommendations for Other Services       Precautions / Restrictions Precautions Precautions: Fall Precaution Comments: seizures Restrictions Weight Bearing Restrictions: No      Mobility Bed Mobility                    Transfers                          Balance Overall balance assessment: Mild deficits observed, not formally tested                                         ADL either performed or assessed with clinical judgement   ADL Overall ADL's : Needs assistance/impaired Eating/Feeding: Independent   Grooming: Standing;Min guard   Upper Body Bathing: Supervision/ safety   Lower Body Bathing: Min guard   Upper Body Dressing : Supervision/safety   Lower Body Dressing: Min  guard   Toilet Transfer: Designer, fashion/clothing and Hygiene: Supervision/safety         General ADL Comments: Overall patient min guard to supervision for ADL tasks due to visual and cognitive deficits. No apparent loss of balance with ambulation in room. Patient using intermittent hand holds.     Vision  Patient Visual Report: Peripheral vision impairment Vision Assessment?: Yes Eye Alignment: Within Functional Limits Alignment/Gaze Preference: Within Defined Limits Tracking/Visual Pursuits: Decreased smoothness of horizontal tracking Additional Comments: Patient wearing glasses. Exhibits some left sided visual deficits. Potential left sided field cut vs Left side hemianopsia. Exhibited a discorganized search pattern with cancellation task - also missing letters on the left. With reading patient missing random words and could not maintain place.     Perception     Praxis      Pertinent Vitals/Pain Pain Assessment Pain Assessment: No/denies pain     Hand Dominance Right   Extremity/Trunk Assessment Upper Extremity Assessment Upper Extremity Assessment: Overall WFL for tasks assessed   Lower Extremity Assessment Lower Extremity Assessment: Defer to PT evaluation   Cervical / Trunk Assessment Cervical / Trunk Assessment: Normal   Communication Communication Communication: No difficulties   Cognition Arousal/Alertness: Awake/alert Behavior During Therapy: WFL for tasks assessed/performed Overall Cognitive Status: Within Functional Limits for tasks assessed                                 General Comments: Alert to self, month, year, Software engineer.  Is appropriate and can follow all commands. Is able to do simple addition but unable to due subtraction in her head.     General Comments  Visual testing in sitting: pt with loss on Lt peripherally and unable state number of fingers until held directly in front of pt. vision intact on Rt peripherally.    Exercises     Shoulder Instructions      Home Living Family/patient expects to be discharged to:: Private residence Living Arrangements: Spouse/significant other Available Help at Discharge: Family;Available 24 hours/day Type of Home: House Home Access: Stairs to enter [employer] of Steps: 4 Entrance  Stairs-Rails: Can reach both Home Layout: One level;Laundry or work area in basement     [employer]: Occupational psychologist: [place]: Grab bars - toilet;Grab bars - tub/shower          Prior Functioning/Environment Prior Level of Function : Independent/Modified Independent;Driving                        OT Problem List: Decreased cognition;Impaired balance (sitting and/or standing);Impaired vision/perception      OT Treatment/Interventions:      OT Goals(Current goals can be found in the care plan section) Acute Rehab OT Goals OT Goal Formulation: All assessment and education complete, DC therapy  OT Frequency:      Co-evaluation              AM-PAC OT "6 Clicks" Daily Activity     Outcome Measure Help from another person eating meals?: None Help from another person taking care of personal grooming?: A Little Help from another person toileting, which includes using toliet, bedpan, or urinal?: A Little Help from another person bathing (including washing, rinsing, drying)?: A Little Help from another person to put on and taking off regular upper body clothing?: A Little Help from  another person to put on and taking off regular lower body clothing?: A Little 6 Click Score: 19   End of Session Nurse Communication: Mobility status  Activity Tolerance: Patient tolerated treatment well Patient left: in chair;with call bell/phone within reach;with chair alarm set  OT Visit Diagnosis: Other symptoms and signs involving the nervous system (R29.898);Other symptoms and signs involving cognitive function                Time: 6203-5597 OT Time Calculation (min): 26 min Charges:  OT General Charges $OT Visit: 1 Visit OT Evaluation $OT Eval Moderate Complexity: 1 Mod  Gustavo Lah, OTR/L [place]  Office [phone]   Lenward Chancellor 07/26/2022, 12:48 PM

## 2022-07-27 DIAGNOSIS — G40901 Epilepsy, unspecified, not intractable, with status epilepticus: Secondary | ICD-10-CM | POA: Diagnosis not present

## 2022-07-27 LAB — CBC WITH DIFFERENTIAL/PLATELET
Abs Immature Granulocytes: 0.08 10*3/uL — ABNORMAL HIGH (ref 0.00–0.07)
Basophils Absolute: 0 10*3/uL (ref 0.0–0.1)
Basophils Relative: 0 %
Eosinophils Absolute: 0 10*3/uL (ref 0.0–0.5)
Eosinophils Relative: 1 %
HCT: 28.4 % — ABNORMAL LOW (ref 36.0–46.0)
Hemoglobin: 9.4 g/dL — ABNORMAL LOW (ref 12.0–15.0)
Immature Granulocytes: 1 %
Lymphocytes Relative: 7 %
Lymphs Abs: 0.5 10*3/uL — ABNORMAL LOW (ref 0.7–4.0)
MCH: 33.3 pg (ref 26.0–34.0)
MCHC: 33.1 g/dL (ref 30.0–36.0)
MCV: 100.7 fL — ABNORMAL HIGH (ref 80.0–100.0)
Monocytes Absolute: 0.8 10*3/uL (ref 0.1–1.0)
Monocytes Relative: 11 %
Neutro Abs: 6 10*3/uL (ref 1.7–7.7)
Neutrophils Relative %: 80 %
Platelets: 108 10*3/uL — ABNORMAL LOW (ref 150–400)
RBC: 2.82 MIL/uL — ABNORMAL LOW (ref 3.87–5.11)
RDW: 15.9 % — ABNORMAL HIGH (ref 11.5–15.5)
WBC: 7.5 10*3/uL (ref 4.0–10.5)
nRBC: 0 % (ref 0.0–0.2)

## 2022-07-27 LAB — COMPREHENSIVE METABOLIC PANEL
ALT: 57 U/L — ABNORMAL HIGH (ref 0–44)
AST: 48 U/L — ABNORMAL HIGH (ref 15–41)
Albumin: 2.9 g/dL — ABNORMAL LOW (ref 3.5–5.0)
Alkaline Phosphatase: 202 U/L — ABNORMAL HIGH (ref 38–126)
Anion gap: 8 (ref 5–15)
BUN: 10 mg/dL (ref 8–23)
CO2: 27 mmol/L (ref 22–32)
Calcium: 8.8 mg/dL — ABNORMAL LOW (ref 8.9–10.3)
Chloride: 98 mmol/L (ref 98–111)
Creatinine, Ser: 0.6 mg/dL (ref 0.44–1.00)
GFR, Estimated: >60 mL/min (ref >60)
Glucose, Bld: 139 mg/dL — ABNORMAL HIGH (ref 70–99)
Potassium: 3.7 mmol/L (ref 3.5–5.1)
Sodium: 133 mmol/L — ABNORMAL LOW (ref 135–145)
Total Bilirubin: 1 mg/dL (ref 0.3–1.2)
Total Protein: 7.4 g/dL (ref 6.5–8.1)

## 2022-07-27 LAB — GLUCOSE, CAPILLARY
Glucose-Capillary: 142 mg/dL — ABNORMAL HIGH (ref 70–99)
Glucose-Capillary: 128 mg/dL — ABNORMAL HIGH (ref 70–99)
Glucose-Capillary: 129 mg/dL — ABNORMAL HIGH (ref 70–99)
Glucose-Capillary: 157 mg/dL — ABNORMAL HIGH (ref 70–99)

## 2022-07-27 NOTE — TOC Transition Note (Signed)
Transition of Care [facility]) - CM/SW Discharge Note   Patient Details  Name: Pamela [name] MRN: [id number] Date of Birth: 17-Oct-1960  Transition of Care [facility]) CM/SW Contact:  Vassie Moselle, LCSW Phone Number: 07/27/2022, 12:06 PM   Clinical Narrative:    Met  with pt and DIL and confirmed plan for home health. HHPT/OT has been arranged w/ Alvis Lemmings. RW has been ordered with [employer] and will be delivered to pt's room prior to discharge.   Final next level of care: [place] Barriers to Discharge: Barriers Resolved   Patient Goals and CMS Choice Patient states their goals for this hospitalization and ongoing recovery are:: To return home CMS Medicare.gov Compare Post Acute Care list provided to:: Patient Choice offered to / list presented to : Patient, Adult Children  Discharge Placement                       Discharge Plan and Services                DME Arranged: Bedside commode, Walker rolling DME Agency: [employer] Date DME Agency Contacted: 07/27/22 Time DME Agency Contacted: 7530 Representative spoke with at DME Agency: [place]: PT, OT [place] Agency: [place] Date [place]: 07/27/22 Time [place]: 0511 Representative spoke with at [place]: [place] ([place]) Interventions     Readmission Risk Interventions    07/27/2022   12:05 PM 07/27/2022   11:50 AM 11/06/2021    1:50 PM  Readmission Risk Prevention Plan  Transportation Screening Complete Complete Complete  PCP or Specialist Appt within 3-5 Days Complete Complete Complete  HRI or Home Care Consult Complete Complete Complete  Social Work Consult for [place] Planning/Counseling Complete Complete Complete  Palliative Care Screening Not Applicable Not Applicable Not Applicable  Medication Review Press photographer) Complete Complete Complete

## 2022-07-27 NOTE — Progress Notes (Signed)
Consultation Progress Note   Patient: Pamela [name] YHC:[id number] DOB: 1961/02/13 DOA: 07/16/2022 DOS: the patient was seen and examined on 07/27/2022 Primary service: Lorella Nimrod, MD  Brief hospital course: 61 year old woman who presented to [facility] ED 10/30 via EMS after being found down at home with seizure-like activity. LKW 2040, found around 2245 having L-sided partial seizures and hyperglycemic to 300s. Recent transient spontaenously-resolving episodes of slurred speech at home per family. PMHx significant for HTN, T2DM, hypothyroidism, SCLC (s/p chemotherapy, followed by Dr. Marin Olp) with known liver metastasis and recently diagnosed brain metastasis with plan for whole brain radiation (not yet started).    On EMS arrival, patient was reportedly mostly unresponsive with ongoing L-sided partial seizure activity. Versed IV (53m total) administered without cessation of seizure. Labs were notable for WBC 11.6, H&H/Plt WNL, hyponatremia with Na 126, K 6.2, serum glucose 836, BUN 49, Cr 1.49 (baseline 0.9). LFTs elevated with AST/ALT 76/110, Alk Phos 226. INR WNL. UA with > 500 glucose; UDS +BZD (s/p Ativan/Versed). Ethanol negative. [facility]; Propofol gtt + Versed initiated. Neuro consulted; rapid EEG completed. Keppra load given + fosphenytoin. CT Head with multiple hyperdense/hemorrhagic metastases (largest 2.6cm, unchanged) with edema of R frontal and parietal/occipital lobes. CT C-spine negative.    [facility] TRH to assume care 07/23/2022  11/11: Patient currently hemodynamically stable and tolerating the radiation therapy well.  No recent seizure.  Rad[facility]  Labs seems stable.   Assessment & Plan:    Status epilepticus ([place]  - [facility]   07/22/22 -> last focal seiure 07/20/22 eeg - ileptogenicity and cortical dysfunction arising from right temporoparietal region likely secondary to underlying mass. Additionally there was mild to moderate diffuse encephalopathy, nonspecific etiology. No seizures were seen during this study. Currently no seizures  -Patient was started on Keppra, Vimpat and Dilantin by neurology. - if develops convulsive status despite current AED regimen again may need elective intubation   - palliative care following   Hyperglycemia Due to steroids for vasogenic edema and uncontrolled type 2 DM  -CBG currently within goal. 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  HIT panel check on 10/6 was negative. -Continue to monitor   DVT prophylaxis: Lovenox  Code Status:  full code   Subjective: Patient was seen and examined today.  No new complaints.  She wants to go home.  Physical Exam: General.  Chronically ill-appearing lady, in no acute distress. Pulmonary.  Lungs clear bilaterally, normal respiratory effort. CV.  Regular rate and rhythm, no JVD, rub or murmur. Abdomen.  Soft, nontender, nondistended, BS positive. CNS.  Alert and oriented .  No focal neurologic deficit. Extremities.  No edema, no cyanosis, pulses intact and symmetrical. Psychiatry.  Judgment and insight appears normal.              Vitals:     Vitals:   07/26/22 0524 07/26/22 1329 07/26/22 2050 07/27/22 0517  BP: (!) 138/52 (!) 129/49 (!) 113/52 137/61  Pulse:  85 87 82 82  Resp: _0 Temp: 98.9 F (37.2 C) 98.4 F (36.9 C) 98.7 F (37.1 C) 98 F (36.7 C)  TempSrc:  Oral Oral Oral Oral  SpO2: 92% 94% 97% 93%  Weight:      Height:        Data Reviewed: Prior data reviewed  Family Communication: Discussed with daughter-in-law at bedside  Time spent: 45 minutes.  This record has been created using Systems analyst. Errors have been sought and corrected,but may not always be located. Such creation errors do not reflect on the standard of care.   Author: Lorella Nimrod, MD 07/27/2022 8:20 AM  For on call review [email or web address].

## 2022-07-27 NOTE — Progress Notes (Signed)
So far, she is doing very well.  She is tolerating radiation therapy well.  She has had no problems with seizures.  She is walking.  She is eating better.  There is no bowel or bladder incontinence.  She has had no pain.  There is no bleeding.  There are no labs back yet.  She really would like to go home.  She lives in [place].  At this point, I think that she might be able to have radiation as an outpatient.  However, this MUST be done at [place].  She really is not able to go home until we can get the confirmation that she can have the radiation done locally.  This really is helping that our Radiation Oncologists need to arrange.  They can call the radiation oncology center at [facility] and see if her care can be transferred there for outpatient.  Unfortunately, if this is able to be done this by would not be done until early next week.  She continues on all of her seizure medications.  I do not know if Neurology can come by and may be tried a narrow down, she needs to take.  Her vital signs are all stable.  Temperature is 98.  Pulse 82.  Blood pressure 137/61.  Her head neck exam is unremarkable.  There is no scleral icterus.  Her pupils react appropriately.  Lungs are clear.  Cardiac exam regular rate and rhythm.  Abdomen is soft.  She has decent bowel sounds.  There is no fluid wave.  There is no guarding or rebound tenderness.  Extremity shows no clubbing, cyanosis or edema.  She has good strength in upper lower extremities.  Neurological exam is nonfocal.  Ms. Snare has the progressive small cell lung cancer.  She has extensive brain metastasis.  She came in with seizures.  These have been very well controlled now.  She is getting XRT.  Again she really would like to go home and have radiation done locally.  From my point of view, this can be done.  I do not see a problem with her having radiation as an outpatient since she is done well as an inpatient without any  seizures.  I know that she has had fantastic care from everybody on [address].  I do appreciate all of their compassion and devotion.  [name], MD  Colossians 3:23

## 2022-07-27 NOTE — Plan of Care (Signed)
  Problem: Coping: Goal: Ability to adjust to condition or change in health will improve Outcome: Progressing   Problem: Clinical Measurements: Goal: Diagnostic test results will improve Outcome: Progressing   Problem: Safety: Goal: Ability to remain free from injury will improve Outcome: Progressing

## 2022-07-28 DIAGNOSIS — G40901 Epilepsy, unspecified, not intractable, with status epilepticus: Secondary | ICD-10-CM | POA: Diagnosis not present

## 2022-07-28 LAB — COMPREHENSIVE METABOLIC PANEL
ALT: 49 U/L — ABNORMAL HIGH (ref 0–44)
AST: 45 U/L — ABNORMAL HIGH (ref 15–41)
Albumin: 2.9 g/dL — ABNORMAL LOW (ref 3.5–5.0)
Alkaline Phosphatase: 220 U/L — ABNORMAL HIGH (ref 38–126)
Anion gap: 11 (ref 5–15)
BUN: 10 mg/dL (ref 8–23)
CO2: 26 mmol/L (ref 22–32)
Calcium: 9.1 mg/dL (ref 8.9–10.3)
Chloride: 98 mmol/L (ref 98–111)
Creatinine, Ser: 0.62 mg/dL (ref 0.44–1.00)
GFR, Estimated: >60 mL/min (ref >60)
Glucose, Bld: 154 mg/dL — ABNORMAL HIGH (ref 70–99)
Potassium: 4 mmol/L (ref 3.5–5.1)
Sodium: 135 mmol/L (ref 135–145)
Total Bilirubin: 1.2 mg/dL (ref 0.3–1.2)
Total Protein: 7.1 g/dL (ref 6.5–8.1)

## 2022-07-28 LAB — CBC WITH DIFFERENTIAL/PLATELET
Abs Immature Granulocytes: 0.1 10*3/uL — ABNORMAL HIGH (ref 0.00–0.07)
Basophils Absolute: 0 10*3/uL (ref 0.0–0.1)
Basophils Relative: 0 %
Eosinophils Absolute: 0 10*3/uL (ref 0.0–0.5)
Eosinophils Relative: 1 %
HCT: 28.8 % — ABNORMAL LOW (ref 36.0–46.0)
Hemoglobin: 9.3 g/dL — ABNORMAL LOW (ref 12.0–15.0)
Immature Granulocytes: 1 %
Lymphocytes Relative: 5 %
Lymphs Abs: 0.5 10*3/uL — ABNORMAL LOW (ref 0.7–4.0)
MCH: 32.6 pg (ref 26.0–34.0)
MCHC: 32.3 g/dL (ref 30.0–36.0)
MCV: 101.1 fL — ABNORMAL HIGH (ref 80.0–100.0)
Monocytes Absolute: 0.8 10*3/uL (ref 0.1–1.0)
Monocytes Relative: 10 %
Neutro Abs: 7 10*3/uL (ref 1.7–7.7)
Neutrophils Relative %: 83 %
Platelets: 100 10*3/uL — ABNORMAL LOW (ref 150–400)
RBC: 2.85 MIL/uL — ABNORMAL LOW (ref 3.87–5.11)
RDW: 15.9 % — ABNORMAL HIGH (ref 11.5–15.5)
WBC: 8.5 10*3/uL (ref 4.0–10.5)
nRBC: 0 % (ref 0.0–0.2)

## 2022-07-28 LAB — GLUCOSE, CAPILLARY
Glucose-Capillary: 141 mg/dL — ABNORMAL HIGH (ref 70–99)
Glucose-Capillary: 132 mg/dL — ABNORMAL HIGH (ref 70–99)
Glucose-Capillary: 86 mg/dL (ref 70–99)

## 2022-07-28 LAB — PHENYTOIN LEVEL, TOTAL: Phenytoin Lvl: 5.4 ug/mL — ABNORMAL LOW (ref 10.0–20.0)

## 2022-07-28 NOTE — Progress Notes (Signed)
Physical Therapy Treatment Patient Details Name: SCOTTI MOTTER MRN: [id number] DOB: 1961-02-10 Today's Date: 07/28/2022   History of Present Illness 61 y.o. female presents to [facility] hospital on 07/16/2022 after being found down at home with seizure-like activity, hyperglycemic to 300s. Pt required intubation on arrival to ED. CT Head with multiple hyperdense/hemorrhagic metastases (some known already). Extubated 11/2, further seizure-like activity 11/3. PMH includes HTN, DMII, hypothyroidism, SCLC with liver and brain mets.    PT Comments    Pt mostly requiring min/guard-supervision level assist for visual deficits.  Pt ambulated in hallway and challenged with attending to items on her left side.  Pt with slow gait speed however overall steady and no LOB with use of RW.  Continue to recommend HHPT upon d/c.    Recommendations for follow up therapy are one component of a multi-disciplinary discharge planning process, led by the attending physician.  Recommendations may be updated based on patient status, additional functional criteria and insurance authorization.  Follow Up Recommendations  Home health PT     Assistance Recommended at Discharge Intermittent Supervision/Assistance  Patient can return home with the following A little help with walking and/or transfers;A little help with bathing/dressing/bathroom;Assistance with cooking/housework;Direct supervision/assist for medications management;Assist for transportation;Help with stairs or ramp for entrance   Equipment Recommendations  Rolling walker (2 wheels)    Recommendations for Other Services       Precautions / Restrictions Precautions Precautions: Fall Precaution Comments: seizures     Mobility  Bed Mobility               General bed mobility comments: OOB in recliner    Transfers Overall transfer level: Needs assistance Equipment used: None Transfers: Sit to/from Stand Sit to Stand: Supervision                 Ambulation/Gait Ambulation/Gait assistance: Min guard Gait Distance (Feet): 160 Feet Assistive device: Rolling walker (2 wheels) Gait Pattern/deviations: Step-through pattern, Decreased stride length       General Gait Details: pt with overall slow gait speed but steady with RW, pt does drift to left and requires verbal cues to attend to straight path and avoid obstacles on left side; had pt read off left sided room numbers and signs on doors off to left side; pt required increased time to read as well as max cues to continue scanning and looking to left side; upon returning to room (which was off to pt's left side) pt read room numbers 1418 and then 1420 on right side and missed her room requiring cues to turn around (pt able to find room at that point since it was on her right side).   Stairs             Wheelchair Mobility    Modified Rankin (Stroke Patients Only)       Balance                                            Cognition Arousal/Alertness: Awake/alert Behavior During Therapy: WFL for tasks assessed/performed   Area of Impairment: Safety/judgement, Problem solving                         Safety/Judgement: Decreased awareness of safety, Decreased awareness of deficits   Problem Solving: Slow processing, Requires verbal cues  Exercises      General Comments        Pertinent Vitals/Pain Pain Assessment Pain Assessment: No/denies pain    Home Living                          Prior Function            PT Goals (current goals can now be found in the care plan section) Progress towards PT goals: Progressing toward goals    Frequency    Min 3X/week      PT Plan Current plan remains appropriate    Co-evaluation              AM-PAC PT "6 Clicks" Mobility   Outcome Measure  Help needed turning from your back to your side while in a flat bed without using bedrails?: A Little Help  needed moving from lying on your back to sitting on the side of a flat bed without using bedrails?: A Little Help needed moving to and from a bed to a chair (including a wheelchair)?: A Little Help needed standing up from a chair using your arms (e.g., wheelchair or bedside chair)?: A Little Help needed to walk in hospital room?: A Little Help needed climbing 3-5 steps with a railing? : A Little 6 Click Score: 18    End of Session Equipment Utilized During Treatment: Gait belt Activity Tolerance: Patient tolerated treatment well Patient left: in chair;with call bell/phone within reach   PT Visit Diagnosis: Other abnormalities of gait and mobility (R26.89)     Time: 0623-7628 PT Time Calculation (min) (ACUTE ONLY): 18 min  Charges:  $Gait Training: 8-22 mins                     Arlyce Dice, DPT Physical Therapist Acute Rehabilitation Services Preferred contact method: Secure Chat Weekend Pager Only: [phone] Office: [phone]    Myrtis Hopping [place] 07/28/2022, 11:34 AM

## 2022-07-28 NOTE — Progress Notes (Signed)
Consultation Progress Note   Patient: Pamela [name] PFX:[id number] DOB: July 15, 1961 DOA: 07/16/2022 DOS: the patient was seen and examined on 07/28/2022 Primary service: Lorella Nimrod, MD  Brief hospital course: 61 year old woman who presented to [facility] ED 10/30 via EMS after being found down at home with seizure-like activity. LKW 2040, found around 2245 having L-sided partial seizures and hyperglycemic to 300s. Recent transient spontaenously-resolving episodes of slurred speech at home per family. PMHx significant for HTN, T2DM, hypothyroidism, SCLC (s/p chemotherapy, followed by Dr. Marin Olp) with known liver metastasis and recently diagnosed brain metastasis with plan for whole brain radiation (not yet started).    On EMS arrival, patient was reportedly mostly unresponsive with ongoing L-sided partial seizure activity. Versed IV (8m total) administered without cessation of seizure. Labs were notable for WBC 11.6, H&H/Plt WNL, hyponatremia with Na 126, K 6.2, serum glucose 836, BUN 49, Cr 1.49 (baseline 0.9). LFTs elevated with AST/ALT 76/110, Alk Phos 226. INR WNL. UA with > 500 glucose; UDS +BZD (s/p Ativan/Versed). Ethanol negative. [facility]; Propofol gtt + Versed initiated. Neuro consulted; rapid EEG completed. Keppra load given + fosphenytoin. CT Head with multiple hyperdense/hemorrhagic metastases (largest 2.6cm, unchanged) with edema of R frontal and parietal/occipital lobes. CT C-spine negative.    [facility] TRH to assume care 07/23/2022  11/11: Patient currently hemodynamically stable and tolerating the radiation therapy well.  No recent seizure.  Radiation oncology [facility]  Labs seems stable.  11/12; patient remained stable.  [facility] likely be  done in next couple of days.  CMP with blood glucose of 154, albumin 2.9, AST 45, ALT 49 and alkaline phosphatase 220, seems stable. CBC also seems stable with hemoglobin of 9.3, MCV 101, platelets 100.  Assessment & Plan:    Status epilepticus ([place]  - [facility]  [facility] -> last focal seiure 07/20/22 eeg - ileptogenicity and cortical dysfunction arising from right temporoparietal region likely secondary to underlying mass. Additionally there was mild to moderate diffuse encephalopathy, nonspecific etiology. No seizures were seen during this study. Currently no seizures  -Patient was started on Keppra, Vimpat and Dilantin by neurology. - if develops convulsive status despite current AED regimen again may need elective intubation   - palliative care following   Hyperglycemia Due to steroids for vasogenic edema and uncontrolled type 2 DM  -CBG currently within goal. 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  HIT panel check on 10/6 was negative. -Continue to monitor   DVT prophylaxis: Lovenox  Code Status:  full code   Subjective: Patient was seen and examined today.  Sitting in chair comfortably, no new complaints.  She was hoping to go [facility] tomorrow [facility]  Did not had any seizure.  Physical Exam: General.  Ill-appearing lady, in no acute distress. Pulmonary.  Lungs clear bilaterally, normal respiratory effort. CV.  Regular rate and rhythm, no JVD, rub or murmur. Abdomen.  Soft, nontender, nondistended, BS  positive. CNS.  Alert and oriented .  No  focal neurologic deficit. Extremities.  No edema, no cyanosis, pulses intact and symmetrical. Psychiatry.  Judgment and insight appears normal.              Vitals:     Vitals:   07/27/22 1415 07/27/22 2006 07/28/22 0442 07/28/22 0443  BP: 127/74 (!) 120/52 (!) 128/54   Pulse: 83 85 85   Resp: _0 Temp: 98.6 F (37 C) 98.9 F (37.2 C) 98.4 F (36.9 C)   TempSrc: Oral Oral Oral   SpO2: 98% 94% 95%   Weight:    82.2 kg  Height:        Data Reviewed: Prior data reviewed  Family Communication: Discussed with patient.  Time spent: 40 minutes.  This record has been created using Systems analyst. Errors have been sought and corrected,but may not always be located. Such creation errors do not reflect on the standard of care.   Author: Lorella Nimrod, MD 07/28/2022 7:58 AM  For on call review [email or web address].

## 2022-07-29 ENCOUNTER — Other Ambulatory Visit: Payer: Self-pay

## 2022-07-29 ENCOUNTER — Ambulatory Visit
Admit: 2022-07-29 | Discharge: 2022-07-29 | Disposition: A | Discharge status: Home / Self Care | Payer: BC Managed Care – PPO | Attending: Radiation Oncology | Admitting: Radiation Oncology

## 2022-07-29 ENCOUNTER — Inpatient Hospital Stay (HOSPITAL_COMMUNITY): Payer: BC Managed Care – PPO

## 2022-07-29 ENCOUNTER — Other Ambulatory Visit: Payer: Self-pay | Admitting: Radiation Therapy

## 2022-07-29 ENCOUNTER — Ambulatory Visit: Payer: BC Managed Care – PPO

## 2022-07-29 DIAGNOSIS — G40901 Epilepsy, unspecified, not intractable, with status epilepticus: Secondary | ICD-10-CM | POA: Diagnosis not present

## 2022-07-29 DIAGNOSIS — C7931 Secondary malignant neoplasm of brain: Secondary | ICD-10-CM

## 2022-07-29 LAB — CBC WITH DIFFERENTIAL/PLATELET
Abs Immature Granulocytes: 0.12 10*3/uL — ABNORMAL HIGH (ref 0.00–0.07)
Basophils Absolute: 0 10*3/uL (ref 0.0–0.1)
Basophils Relative: 0 %
Eosinophils Absolute: 0.1 10*3/uL (ref 0.0–0.5)
Eosinophils Relative: 1 %
HCT: 29.5 % — ABNORMAL LOW (ref 36.0–46.0)
Hemoglobin: 9.4 g/dL — ABNORMAL LOW (ref 12.0–15.0)
Immature Granulocytes: 2 %
Lymphocytes Relative: 8 %
Lymphs Abs: 0.6 10*3/uL — ABNORMAL LOW (ref 0.7–4.0)
MCH: 32.5 pg (ref 26.0–34.0)
MCHC: 31.9 g/dL (ref 30.0–36.0)
MCV: 102.1 fL — ABNORMAL HIGH (ref 80.0–100.0)
Monocytes Absolute: 0.8 10*3/uL (ref 0.1–1.0)
Monocytes Relative: 10 %
Neutro Abs: 6.5 10*3/uL (ref 1.7–7.7)
Neutrophils Relative %: 79 %
Platelets: 113 10*3/uL — ABNORMAL LOW (ref 150–400)
RBC: 2.89 MIL/uL — ABNORMAL LOW (ref 3.87–5.11)
RDW: 15.9 % — ABNORMAL HIGH (ref 11.5–15.5)
WBC: 8.1 10*3/uL (ref 4.0–10.5)
nRBC: 0 % (ref 0.0–0.2)

## 2022-07-29 LAB — COMPREHENSIVE METABOLIC PANEL
ALT: 44 U/L (ref 0–44)
AST: 39 U/L (ref 15–41)
Albumin: 3.2 g/dL — ABNORMAL LOW (ref 3.5–5.0)
Alkaline Phosphatase: 208 U/L — ABNORMAL HIGH (ref 38–126)
Anion gap: 13 (ref 5–15)
BUN: 13 mg/dL (ref 8–23)
CO2: 22 mmol/L (ref 22–32)
Calcium: 9.2 mg/dL (ref 8.9–10.3)
Chloride: 99 mmol/L (ref 98–111)
Creatinine, Ser: 0.69 mg/dL (ref 0.44–1.00)
GFR, Estimated: >60 mL/min (ref >60)
Glucose, Bld: 130 mg/dL — ABNORMAL HIGH (ref 70–99)
Potassium: 4.4 mmol/L (ref 3.5–5.1)
Sodium: 134 mmol/L — ABNORMAL LOW (ref 135–145)
Total Bilirubin: 1 mg/dL (ref 0.3–1.2)
Total Protein: 7.7 g/dL (ref 6.5–8.1)

## 2022-07-29 LAB — RAD ONC ARIA SESSION SUMMARY
Course Elapsed Days: 7
Plan Fractions Treated to Date: 6
Plan Prescribed Dose Per Fraction: 3 Gy
Plan Total Fractions Prescribed: 10
Plan Total Prescribed Dose: 30 Gy
Reference Point Dosage Given to Date: 18 Gy
Reference Point Session Dosage Given: 3 Gy
Session Number: 6

## 2022-07-29 LAB — GLUCOSE, CAPILLARY
Glucose-Capillary: 146 mg/dL — ABNORMAL HIGH (ref 70–99)
Glucose-Capillary: 156 mg/dL — ABNORMAL HIGH (ref 70–99)
Glucose-Capillary: 146 mg/dL — ABNORMAL HIGH (ref 70–99)
Glucose-Capillary: 139 mg/dL — ABNORMAL HIGH (ref 70–99)

## 2022-07-29 LAB — TSH: TSH: 3.691 u[IU]/mL (ref 0.350–4.500)

## 2022-07-29 MED ORDER — GADOBUTROL 1 MMOL/ML IV SOLN
8.0000 mL | Freq: Once | INTRAVENOUS | Status: AC | PRN
  Administered 2022-07-29: 8 mL via INTRAVENOUS

## 2022-07-29 NOTE — Progress Notes (Signed)
Consultation Progress Note   Patient: Pamela [name] [id number]:[id number] DOB: 1960/12/22 DOA: 07/16/2022 DOS: the patient was seen and examined on 07/29/2022 Primary service: Lorella Nimrod, MD  Brief hospital course: 61 year old woman who presented to [facility] ED 10/30 via EMS after being found down at home with seizure-like activity. LKW 2040, found around 2245 having L-sided partial seizures and hyperglycemic to 300s. Recent transient spontaenously-resolving episodes of slurred speech at home per family. PMHx significant for HTN, T2DM, hypothyroidism, SCLC (s/p chemotherapy, followed by Dr. Marin Olp) with known liver metastasis and recently diagnosed brain metastasis with plan for whole brain radiation (not yet started).    On EMS arrival, patient was reportedly mostly unresponsive with ongoing L-sided partial seizure activity. Versed IV (15m total) administered without cessation of seizure. Labs were notable for WBC 11.6, H&H/Plt WNL, hyponatremia with Na 126, K 6.2, serum glucose 836, BUN 49, Cr 1.49 (baseline 0.9). LFTs elevated with AST/ALT 76/110, Alk Phos 226. INR WNL. UA with > 500 glucose; UDS +BZD (s/p Ativan/Versed). Ethanol negative. [facility]; Propofol gtt + Versed initiated. Neuro consulted; rapid EEG completed. Keppra load given + fosphenytoin. CT Head with multiple hyperdense/hemorrhagic metastases (largest 2.6cm, unchanged) with edema of R frontal and parietal/occipital lobes. CT C-spine negative.    [facility] TRH to assume care 07/23/2022  11/11: Patient currently hemodynamically stable and tolerating the radiation therapy well.  No recent seizure.  Rad[facility] onc[facility]  Labs seems stable.  11/12; patient remained stable.  [facility]  [facility] in next couple of days.  CMP with blood glucose of 154, albumin 2.9, AST 45, ALT 49 and alkaline phosphatase 220, seems stable. CBC also seems stable with hemoglobin of 9.3, MCV 101, platelets 100.  11/13: Patient was having difficulty swallowing, swallow evaluation was obtained and she was having very difficulty moving food from anterior to the back of her mouth, most likely secondary to her brain lesions and now radiation.  Appears confused.  Per palliative care note she is very eminent to remain full code and full scope of care.  Discussed with patient and daughter regarding getting a PEG tube for nutritional support, they will discuss with each other and let uKoreaknow. [facility]  Patient is very high risk for deterioration and mortality based on underlying life limiting comorbidities.  Assessment & Plan:    Status epilepticus ([place]  - [facility]  [facility] -> last focal seiure 07/20/22 eeg - ileptogenicity and cortical dysfunction arising from right temporoparietal region likely secondary to underlying mass. Additionally there was mild to moderate diffuse encephalopathy, nonspecific etiology. No seizures were seen during this study. Currently no seizures  -Patient was started on Keppra, Vimpat and Dilantin by neurology. - if develops convulsive status despite current AED regimen again may need elective intubation   - palliative care following-signed off today as patient would like to stay full code with full scope of care.   Hyperglycemia Due to steroids for vasogenic edema and uncontrolled type 2 DM  -CBG currently within goal. 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  SCLC (small cell lung  carcinoma) (HCC) Diagnosed 8 months ago at stage IV- Limited pathway forward -Oncology is on board  Difficulty swallowing.  Swallow evaluation was obtained, please see their note for full detail.  Most likely secondary to her recent brain lesions and radiation. Discussed having PEG tube for nutritional support with patient and daughter, they will let us know. -Continue with p.o. with swallow team recommendations to help with swallowing.   Thrombocytopenia  Improving.  HIT panel check on 10/6 was negative. -Continue to monitor   DVT prophylaxis: Lovenox  Code Status:  full code   Subjective: Patient was seen and examined today.  She was trying to drink with a cup and straw and seems like having a lot of difficulty with drinking pouring from side of her mouth.  Physical Exam: General.  Chronically ill-appearing, frail lady, in no acute distress. Pulmonary.  Lungs clear bilaterally, normal respiratory effort. CV.  Regular rate and rhythm, no JVD, rub or murmur. Abdomen.  Soft, nontender, nondistended, BS positive. CNS.  Alert and oriented .  No focal neurologic deficit. Extremities.  No edema, no cyanosis, pulses intact and symmetrical. Psychiatry.  Judgment and insight appears normal.             Vitals:     Vitals:   07/28/22 1309 07/28/22 2040 07/29/22 0455 07/29/22 1308  BP: (!) 144/67 (!) 143/58 139/69 134/71  Pulse: 79 82 87 96  Resp: 18 (!) _0 Temp: (!) 97.5 F (36.4 C) 99 F (37.2 C) 98.9 F (37.2 C) (!) 97.4 F (36.3 C)  TempSrc: Oral Oral Oral Oral  SpO2: 98% 96% 96% 94%  Weight:      Height:        Data Reviewed: Prior data reviewed  Family Communication: Discussed with daughter at bedside.  Time spent: 43 minutes.  This record has been created using Systems analyst. Errors have been sought and corrected,but may not always be located. Such creation errors do not reflect on the standard of care.   Author: Lorella Nimrod,  MD 07/29/2022 2:51 PM  For on call review [email or web address].

## 2022-07-29 NOTE — Progress Notes (Addendum)
Pamela [name] does not seem to be as talkative this morning.  I am not sure if anything happened over the weekend.  She just seems to be somewhat depressed.  Maybe, she just wants to be able to go home.  Her labs do not look that bad.  Her sodium is 134.  Potassium 4.4.  BUN 13 creatinine 0.69.  Blood sugar 130.  Her calcium is 9.2 with an albumin of 3.2.  Bilirubin is 1.0.  White cell count is 8.1.  Hemoglobin 9.4.  Platelet count 113,000.  There is no pain.  I do not think she has had any nausea or vomiting.  As far as I can tell, there is been no seizures over the weekend.  There is no bowel or bladder incontinence.  There is no cough or shortness of breath.  She has had no bleeding.  Her vital signs show temperature of 98.9.  Pulse 87.  Blood pressure 139/69.  Her head neck exam shows no ocular or oral lesions.  She has no scleral icterus.  Lungs are clear bilaterally.  She has good air movement bilaterally.  Cardiac exam regular rate and rhythm.  She has no murmurs, rubs or bruits.  Abdomen is soft.  Bowel sounds are present.  There is no guarding or rebound tenderness.  Extremity shows no clubbing, cyanosis or edema.  Neurological exam shows no focal deficits.  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  She is on 3 different antiseizure medications.  She is getting radiation therapy to the brain.  So far, I think she is done pretty well with this.  Again I am unsure as to why she seems to be a little bit more withdrawn today.  She will continue radiation therapy.  Again, we are trying to get her to have radiation at [facility] which is real close to her home.  I do appreciate everybody's help with her.   [name], MD  2 Cor 3:17  ADDENDUM: I spoke to her daughter on the phone.  What she tells me, there is a lot of problems with Pamela [name] swallowing.  She not able to swallow.  She wants anything to anybody.  As such, we will get  have to get speech therapy into figure out what might be going on.  This could certainly be from her CNS disease.  [name], MD

## 2022-07-29 NOTE — Progress Notes (Signed)
Mobility Specialist - Progress Note   07/29/22 1159  Mobility  Activity Ambulated with assistance in hallway  Level of Assistance Contact guard assist, steadying assist  Assistive Device Front wheel walker  Distance Ambulated (ft) 100 ft  Range of Motion/Exercises Active  Activity Response Tolerated well  Mobility Referral Yes  $Mobility charge 1 Mobility   Pt was found on recliner chair and agreeable to ambulate. Needs various cues when ambulating and stated that she was fearful of falling. At EOS returned to recliner chair with necessities in reach.  Ferd Hibbs Mobility Specialist

## 2022-07-29 NOTE — Progress Notes (Signed)
Mobility Specialist - Progress Note   07/29/22 1541  Mobility  Activity Ambulated with assistance in hallway  Level of Assistance Contact guard assist, steadying assist  Assistive Device Front wheel walker  Distance Ambulated (ft) 200 ft  Range of Motion/Exercises Active  Activity Response Tolerated well  Mobility Referral Yes  $Mobility charge 1 Mobility   Pt was found on recliner chair and agreeable to ambulate. Pt needs cues when reaching out for RW with L hand and needs cues when ambulating to not hit the wall or objects in the hallway. During session had to take x2 brief standing rest breaks due to lower back pain. At EOS returned to recliner chair with necessities in reach.  Pamela [name] Mobility Specialist

## 2022-07-29 NOTE — Evaluation (Addendum)
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, brain   Uterine cancer ([place]) 2007   Past Surgical History:  Past Surgical History:  Procedure Laterality Date   ABDOMINAL HYSTERECTOMY     BREAST EXCISIONAL BIOPSY Right 11/26/2010   neg/- PROLIFERATIVE FIBROCYSTIC CHANGE WITH ASSOCIATED    CHOLECYSTECTOMY     COLON SURGERY     2003   COLONOSCOPY     COLONOSCOPY WITH PROPOFOL N/A 12/11/2018   Procedure: COLONOSCOPY WITH PROPOFOL;  Surgeon: Lollie Sails, MD;  Location: [facility] ENDOSCOPY;  Service: Endoscopy;  Laterality: N/A;   ESOPHAGOGASTRODUODENOSCOPY (EGD) WITH PROPOFOL N/A 01/15/2022   Procedure: ESOPHAGOGASTRODUODENOSCOPY (EGD) WITH PROPOFOL;  Surgeon: Lesly Rubenstein, MD;  Location: [facility] ENDOSCOPY;  Service: Endoscopy;  Laterality: N/A;  DM   IR IMAGING GUIDED PORT INSERTION  11/20/2021   HPI:  Patient is a 61 y.o. female with PMH: HTN, DM-2, small cell lung cancer with known liver and brain metastasis (s/p chemotherapy). She presented to [facility] ED on 07/15/22 via EMS after being found down at home with seizure like activity. Family also reported transient resolving episodes of slurred speech. On EMS arrival, patient was reportedly mostly unresponsive with ongoing left sided partial seizure activity. She was intubated upon arrival to hospital. CT head showed multiple hyperdense/hemorrhagic metastases (largest 2.6cm, unchanged) with edema of R frontal and  parietal/occipital lobes. CT C-spine negative. She was transferred to [facility] for higher level of care. She was extubated on 07/18/22, had recurrence of seizures on 11/3. She was transferred to [facility] on 11/6 with plan to start whole brain XRT. Speech swallow evaluation ordered by patient's oncologist  (Dr. Marin Olp) after he had a discussion with patient's daughter on phone during which she reported patient has been having a lot of difficulty swallowing.    Assessment / Plan / Recommendation  Clinical Impression  Patient presents with a mod-severe oral phase dysphagia and suspected pharyngeal phase dysphagia as well. Her oral dysphagia is secondary to mod-severe left sided facial and bilabial weakness and decreased ROM as well as decreased ROM and strength with tongue. 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 Patient's daughter in law who was present in the room reported that she noticed the left sided weakness and swallowing difficulties following extubation (intubated 10/30-11/2). When SLP arrived in room, patient sitting up in recliner chair and her daughter in law was assisting as patient took her PO meds, which were crushed in applesauce. Patient exhibited significantly reduced ability to transit any PO's (water, puree, crushed pills) from anterior to posterior portion of oral cavity. In addition, she exhibited PO residuals collecting in left side of oral cavity. Eventually, she was able to transit medication and purees and initiate swallow, however this took a lot of time and effort. Patient had almost continuous anterior spillage of saliva and PO's, which she wiped with a washcloth. Her daughter in law said that she tried to eat some soft solid  foods but she ends up "wearing more" than she swallows. SLP recommended patient try keeping head at neutral and tilted slightly back to help transit PO's better, as well as directing PO's to right side of mouth as it is the stronger side. Patient did exhibit  suspected decreased pharyngeal contraction during swallows and SLP observed a few instances of wet/gurgled voice. SLP will follow patient to ensure all education completed and to determine if any benefit from objective swallow study (MBS). SLP Visit Diagnosis: Dysphagia, oral phase (R13.11);Dysphagia, unspecified (R13.10)    Aspiration Risk  Mild aspiration risk;Risk for inadequate nutrition/hydration    Diet Recommendation Other (Comment) PO's as tolerated  Liquid Administration via: Straw Medication Administration: Other (Comment) As tolerated Supervision: Patient able to self feed Compensations: Small sips/bites;Slow rate;Monitor for anterior loss;Follow solids with liquid;Effortful swallow Postural Changes: Seated upright at 90 degrees    Other  Recommendations Oral Care Recommendations: Oral care BID    Recommendations for follow up therapy are one component of a multi-disciplinary discharge planning process, led by the attending physician.  Recommendations may be updated based on patient status, additional functional criteria and insurance authorization.  Follow up Recommendations Follow physician's recommendations for discharge plan and follow up therapies      Assistance Recommended at Discharge Set up Supervision/Assistance  Functional Status Assessment Patient has had a recent decline in their functional status and demonstrates the ability to make significant improvements in function in a reasonable and predictable amount of time.  Frequency and Duration min 2x/week  1 week       Prognosis Prognosis for Safe Diet Advancement: Fair Barriers to Reach Goals: Severity of deficits      Swallow Study   General Date of Onset: 07/19/22 HPI: Patient is a 61 y.o. female with PMH: HTN, DM-2, small cell lung cancer with known liver and brain metastasis (s/p chemotherapy). She presented to [facility] ED on 07/15/22 via EMS after being found down at home with seizure like activity. Family  also reported transient resolving episodes of slurred speech. On EMS arrival, patient was reportedly mostly unresponsive with ongoing left sided partial seizure activity. She was intubated upon arrival to hospital. CT head showed multiple hyperdense/hemorrhagic metastases (largest 2.6cm, unchanged) with edema of R frontal and parietal/occipital lobes. CT C-spine negative. She was transferred to [facility] for higher level of care. She was extubated on 07/18/22, had recurrence of seizures on 11/3. She was transferred to [facility] on 11/6 with plan to start whole brain XRT. Speech swallow evaluation ordered by patient's oncologist  (Dr. Marin Olp) after he had a discussion with patient's daughter on phone during which she reported patient has been having a lot of difficulty swallowing. Type of Study: Bedside Swallow Evaluation Previous Swallow Assessment: none found Diet Prior to this Study: Regular;Thin liquids Temperature Spikes Noted: No Respiratory Status: Room air History of Recent Intubation: Yes Length of Intubations (days): 4 days Date extubated: 07/18/22 Behavior/Cognition: Alert;Cooperative Oral Cavity Assessment: Other (comment) (WFL aside from PO residuals) Oral Care Completed by SLP: No Oral Cavity - Dentition: Dentures, top Self-Feeding Abilities: Needs assist;Able to feed self;Needs set up Patient Positioning: Upright in chair Baseline Vocal Quality: Normal;Hoarse Volitional Cough: Strong Volitional Swallow: Able to elicit    Oral/Motor/Sensory Function Overall Oral Motor/Sensory Function: Severe impairment Facial ROM: Reduced left;Suspected CN VII (facial) dysfunction Facial Symmetry: Abnormal symmetry left;Suspected CN VII (facial) dysfunction Facial Strength: Reduced left;Suspected CN VII (facial) dysfunction Facial Sensation: Within Functional Limits Lingual ROM: Reduced right Lingual Symmetry: Abnormal symmetry left;Suspected  CN XII (hypoglossal) dysfunction Lingual Strength:  Reduced Velum: Within Functional Limits Mandible: Within Functional Limits   Ice Chips     Thin Liquid Thin Liquid: Impaired Presentation: Straw Oral Phase Impairments: Reduced labial seal Oral Phase Functional Implications: Left anterior spillage;Prolonged oral transit;Oral residue;Left lateral sulci pocketing Pharyngeal  Phase Impairments: Suspected delayed Swallow;Wet Vocal Quality    Nectar Thick     Honey Thick     Puree Puree: Impaired Oral Phase Impairments: Reduced labial seal;Reduced lingual movement/coordination Oral Phase Functional Implications: Left anterior spillage;Oral residue;Oral holding Pharyngeal Phase Impairments: Suspected delayed Swallow;Wet Vocal Quality   Solid     Solid: Not tested     Sonia Baller, MA, CCC-SLP Speech Therapy

## 2022-07-29 NOTE — Progress Notes (Signed)
  Daily Progress Note   Patient Name: Pamela [name]       Date: 07/29/2022 DOB: 08/23/1961  Age: 61 y.o. MRN#: [id number] Attending Physician: Lorella Nimrod, MD Primary Care Physician: Marinda Elk, MD Admit Date: 07/16/2022 Length of Stay: 13 days  PMT has been following peripherally.  Patient's goals for care remained focused on full scope of care. As goals for medical care are currently determined and planning for discharge hopefully soon with follow up for radiation, palliative care team will sign off. Please reach out if our team can be of further assistance in the future. Thank you for involving our team in patient's care.    This provider has placed a referral for follow up for patient with outpatient palliative care at [facility].  Chelsea Aus, DO Palliative Care Provider PMT # [phone]

## 2022-07-30 ENCOUNTER — Telehealth: Payer: Self-pay | Admitting: Radiation Therapy

## 2022-07-30 ENCOUNTER — Ambulatory Visit: Payer: BC Managed Care – PPO

## 2022-07-30 ENCOUNTER — Other Ambulatory Visit: Payer: Self-pay

## 2022-07-30 ENCOUNTER — Ambulatory Visit
Admit: 2022-07-30 | Discharge: 2022-07-30 | Disposition: A | Discharge status: Home / Self Care | Payer: BC Managed Care – PPO | Attending: Radiation Oncology | Admitting: Radiation Oncology

## 2022-07-30 ENCOUNTER — Other Ambulatory Visit: Payer: Self-pay | Admitting: Radiation Therapy

## 2022-07-30 ENCOUNTER — Encounter: Payer: Self-pay | Admitting: *Deleted

## 2022-07-30 DIAGNOSIS — C7931 Secondary malignant neoplasm of brain: Secondary | ICD-10-CM

## 2022-07-30 DIAGNOSIS — G40901 Epilepsy, unspecified, not intractable, with status epilepticus: Secondary | ICD-10-CM | POA: Diagnosis not present

## 2022-07-30 LAB — CBC WITH DIFFERENTIAL/PLATELET
Abs Immature Granulocytes: 0.09 10*3/uL — ABNORMAL HIGH (ref 0.00–0.07)
Basophils Absolute: 0 10*3/uL (ref 0.0–0.1)
Basophils Relative: 0 %
Eosinophils Absolute: 0.1 10*3/uL (ref 0.0–0.5)
Eosinophils Relative: 1 %
HCT: 29.2 % — ABNORMAL LOW (ref 36.0–46.0)
Hemoglobin: 9.3 g/dL — ABNORMAL LOW (ref 12.0–15.0)
Immature Granulocytes: 1 %
Lymphocytes Relative: 9 %
Lymphs Abs: 0.6 10*3/uL — ABNORMAL LOW (ref 0.7–4.0)
MCH: 32.3 pg (ref 26.0–34.0)
MCHC: 31.8 g/dL (ref 30.0–36.0)
MCV: 101.4 fL — ABNORMAL HIGH (ref 80.0–100.0)
Monocytes Absolute: 0.6 10*3/uL (ref 0.1–1.0)
Monocytes Relative: 9 %
Neutro Abs: 5.8 10*3/uL (ref 1.7–7.7)
Neutrophils Relative %: 80 %
Platelets: 105 10*3/uL — ABNORMAL LOW (ref 150–400)
RBC: 2.88 MIL/uL — ABNORMAL LOW (ref 3.87–5.11)
RDW: 15.9 % — ABNORMAL HIGH (ref 11.5–15.5)
WBC: 7.3 10*3/uL (ref 4.0–10.5)
nRBC: 0 % (ref 0.0–0.2)

## 2022-07-30 LAB — COMPREHENSIVE METABOLIC PANEL
ALT: 41 U/L (ref 0–44)
AST: 42 U/L — ABNORMAL HIGH (ref 15–41)
Albumin: 3.1 g/dL — ABNORMAL LOW (ref 3.5–5.0)
Alkaline Phosphatase: 236 U/L — ABNORMAL HIGH (ref 38–126)
Anion gap: 11 (ref 5–15)
BUN: 14 mg/dL (ref 8–23)
CO2: 24 mmol/L (ref 22–32)
Calcium: 9.3 mg/dL (ref 8.9–10.3)
Chloride: 98 mmol/L (ref 98–111)
Creatinine, Ser: 0.76 mg/dL (ref 0.44–1.00)
GFR, Estimated: >60 mL/min (ref >60)
Glucose, Bld: 135 mg/dL — ABNORMAL HIGH (ref 70–99)
Potassium: 4.4 mmol/L (ref 3.5–5.1)
Sodium: 133 mmol/L — ABNORMAL LOW (ref 135–145)
Total Bilirubin: 0.9 mg/dL (ref 0.3–1.2)
Total Protein: 7.4 g/dL (ref 6.5–8.1)

## 2022-07-30 LAB — RAD ONC ARIA SESSION SUMMARY
Course Elapsed Days: 8
Plan Fractions Treated to Date: 7
Plan Prescribed Dose Per Fraction: 3 Gy
Plan Total Fractions Prescribed: 10
Plan Total Prescribed Dose: 30 Gy
Reference Point Dosage Given to Date: 21 Gy
Reference Point Session Dosage Given: 3 Gy
Session Number: 7

## 2022-07-30 LAB — GLUCOSE, CAPILLARY
Glucose-Capillary: 150 mg/dL — ABNORMAL HIGH (ref 70–99)
Glucose-Capillary: 104 mg/dL — ABNORMAL HIGH (ref 70–99)
Glucose-Capillary: 136 mg/dL — ABNORMAL HIGH (ref 70–99)
Glucose-Capillary: 106 mg/dL — ABNORMAL HIGH (ref 70–99)

## 2022-07-30 LAB — PHENYTOIN LEVEL, TOTAL: Phenytoin Lvl: 3 ug/mL — ABNORMAL LOW (ref 10.0–20.0)

## 2022-07-30 MED ORDER — TRAMADOL HCL 50 MG PO TABS
50.0000 mg | ORAL_TABLET | Freq: Four times a day (QID) | ORAL | Status: DC | PRN
  Administered 2022-07-30 – 2022-07-31 (×3): 50 mg via ORAL
  Filled 2022-07-30 (×3): qty 1

## 2022-07-30 MED ORDER — DEXAMETHASONE 4 MG PO TABS
4.0000 mg | ORAL_TABLET | Freq: Four times a day (QID) | ORAL | Status: DC
  Administered 2022-07-30 – 2022-08-02 (×13): 4 mg via ORAL
  Filled 2022-07-30 (×13): qty 1

## 2022-07-30 MED ORDER — PHENYTOIN SODIUM EXTENDED 100 MG PO CAPS
200.0000 mg | ORAL_CAPSULE | Freq: Two times a day (BID) | ORAL | Status: DC
  Administered 2022-07-30: 200 mg via ORAL
  Filled 2022-07-30 (×2): qty 2

## 2022-07-30 NOTE — Progress Notes (Signed)
Physical Therapy Treatment Patient Details Name: Pamela [name] MRN: [id number] DOB: April 07, 1961 Today's Date: 07/30/2022   History of Present Illness 61 y.o. female presents to [facility] hospital on 07/16/2022 after being found down at home with seizure-like activity, hyperglycemic to 300s. Pt required intubation on arrival to ED. CT Head with multiple hyperdense/hemorrhagic metastases (some known already). Extubated 11/2, further seizure-like activity 11/3. PMH includes HTN, DMII, hypothyroidism, SCLC with liver and brain mets.    PT Comments    Pt agreeable to working with therapy. Per chart review, imaging (+) for worsening of tumor size. Pt required cues to scan/turn head to L side and to intermittently maneuver RW (primarily with turns/changes in direction- ? Left side neglect and/or visual deficits? When verbally cued to look to L, pt would sometimes turn head to R. Will continue to follow and progress activity as tolerated.    Recommendations for follow up therapy are one component of a multi-disciplinary discharge planning process, led by the attending physician.  Recommendations may be updated based on patient status, additional functional criteria and insurance authorization.  Follow Up Recommendations  Home health PT     Assistance Recommended at Discharge Frequent or constant Supervision/Assistance  Patient can return home with the following A little help with walking and/or transfers;A little help with bathing/dressing/bathroom;Assistance with cooking/housework;Direct supervision/assist for medications management;Assist for transportation;Help with stairs or ramp for entrance   Equipment Recommendations  Rolling walker (2 wheels)    Recommendations for Other Services       Precautions / Restrictions Precautions Precautions: Fall Precaution Comments: seizures Restrictions Weight Bearing Restrictions: No     Mobility  Bed Mobility               General bed mobility  comments: OOB in recliner    Transfers Overall transfer level: Needs assistance Equipment used: None Transfers: Sit to/from Stand Sit to Stand: Supervision           General transfer comment: supervision for safety. no assist or cues for technique needed.    Ambulation/Gait Ambulation/Gait assistance: Min guard Gait Distance (Feet): 175 Feet Assistive device: Rolling walker (2 wheels) Gait Pattern/deviations: Step-through pattern, Decreased stride length       General Gait Details: pt with overall slow gait speed but steady with RW, pt does drift to left and requires verbal cues to attend to straight path and avoid obstacles on left side. cues for pt to scan environment especially to L side-at times required manual cue on L side to get pt to turn head to the L   Stairs             Wheelchair Mobility    Modified Rankin (Stroke Patients Only)       Balance Overall balance assessment: Needs assistance         Standing balance support: Bilateral upper extremity supported, Single extremity supported, Reliant on assistive device for balance, During functional activity Standing balance-Leahy Scale: Poor                              Cognition Arousal/Alertness: Awake/alert Behavior During Therapy: WFL for tasks assessed/performed   Area of Impairment: Problem solving, Safety/judgement                         Safety/Judgement: Decreased awareness of safety, Decreased awareness of deficits   Problem Solving: Slow processing, Requires verbal cues  Exercises      General Comments        Pertinent Vitals/Pain Pain Assessment Pain Assessment: Faces Faces Pain Scale: Hurts even more Pain Location: back Pain Descriptors / Indicators: Aching Pain Intervention(s): Heat applied, Limited activity within patient's tolerance    Home Living                          Prior Function            PT Goals (current  goals can now be found in the care plan section) Progress towards PT goals: Progressing toward goals    Frequency    Min 3X/week      PT Plan Current plan remains appropriate    Co-evaluation              AM-PAC PT "6 Clicks" Mobility   Outcome Measure  Help needed turning from your back to your side while in a flat bed without using bedrails?: A Little Help needed moving from lying on your back to sitting on the side of a flat bed without using bedrails?: A Little Help needed moving to and from a bed to a chair (including a wheelchair)?: A Little Help needed standing up from a chair using your arms (e.g., wheelchair or bedside chair)?: A Little Help needed to walk in hospital room?: A Little Help needed climbing 3-5 steps with a railing? : A Little 6 Click Score: 18    End of Session Equipment Utilized During Treatment: Gait belt Activity Tolerance: Patient tolerated treatment well Patient left: in chair;with call bell/phone within reach;with chair alarm set   PT Visit Diagnosis: Muscle weakness (generalized) (M62.81);Difficulty in walking, not elsewhere classified (R26.2)     Time: 8309-4076 PT Time Calculation (min) (ACUTE ONLY): 17 min  Charges:  $Gait Training: 8-22 mins                        Doreatha Massed, PT Acute Rehabilitation  Office: [phone]

## 2022-07-30 NOTE — Progress Notes (Signed)
Patient continues to be hospitalized. She has declined over the last few days. MRI showed possible progression of brain mets vs inflammation from radiation. She is now also having difficulty with swallowing and needs to consider a feeding tube. She will finish radiation at the end of this week.   Will continue to follow for post discharge needs and office follow up.   Oncology Nurse Navigator Documentation     07/30/2022    9:15 AM  Oncology Nurse Navigator Flowsheets  Navigator Follow Up Date: 08/05/2022  Navigator Follow Up Reason: Appointment Review  Navigator Location [facility]  Navigator Encounter Type Appt/Treatment Plan Review  Patient Visit Type MedOnc  Treatment Phase Active Tx  Barriers/Navigation Needs Coordination of Care;Education  Interventions None Required  Acuity Level 2-Minimal Needs (1-2 Barriers Identified)  Support Groups/Services Friends and Family  Time Spent with Patient 15

## 2022-07-30 NOTE — Progress Notes (Signed)
Unfortunately, I think that we may have an issue now.  Ms. Maione had MRI of her brain yesterday.  It shows that all of her tumors are larger.  Sometimes, with radiation, you can see some tumor swelling and then shrinkage.  Little bit of mass effect noted on the right side.  I am going to have to increase her steroids back up now.  This is somewhat complicated now.  She is really not able to swallow properly.  She was evaluated by speech pathology.  I think recommendation may be for a feeding tube.  She is having some weakness over on the left side of the face.  Again, I suspect this is all from her metastasis.  Maybe, increasing the Decadron will help with this a little bit.  I just hate to have her get a feeding tube.  This is particularly in light of the fact that she may have progression of her brain metastasis despite radiation.  I will have to talk with Radiation Oncology to see if they would have to say about the MRI.  She was drooling a little bit this morning.  Her speech may have been a little bit better.  Her labs show white cell count of 7.3.  Hemoglobin 9.3.  Platelet count 105,000. Sodium 133.  Potassium 4.4.  BUN 14 creatinine 0.76.  Glucose is 135.  Her albumin is 3.1.  Bilirubin is 0.9.  She has little bit of discomfort over in the lower back.  She is having no issues with bowels or bladder.  There is no obvious incontinence.  She has had no cough or shortness of breath.  I do not think she is eating much is because of the swallowing difficulties.  Her vital signs show temperature of 97.9.  Pulse 84.  Blood pressure is 139/71.  Her lungs sound clear.  Her head and neck exam does show a little bit weakness on the left side of the face.  She has no oral thrush.  Her lungs sound clear bilaterally.  Cardiac exam regular rate and rhythm.  Abdomen is soft.  Bowel sounds are present.  She has no fluid wave.  Her liver edge may be at the right costal margin.  Extremities shows little bit of  weakness over on the left side.  Neurological exam shows the above deficits.  This is certainly quite serious.  Again, the MRI does show that there is some enlargement of her brain mets.  There are no new brain metastasis.  Again, I does wonder if the radiation might be causing some of the swelling and then there may be improvement.  Again Radiation Oncology can help [name] with this.  I know that the decision for a feeding tube is not can be an easy 1 for her.  I just would hate to have a tube put into her and she not be able to eat.  I know that it was working hard with Ms. Smoak.  I know that she is doing what she can do.  She will continue radiation therapy for right now.  We will increase the Decadron to every 6 hour intervals.  Maybe this might help a little bit.  [name], MD  Psalm 18:1-2

## 2022-07-30 NOTE — Progress Notes (Signed)
Speech Language Pathology Treatment: Dysphagia  Patient Details Name: Pamela [name] MRN: [id number] DOB: 01-06-1961 Today's Date: 07/30/2022 Time: 7494-4967 SLP Time Calculation (min) (ACUTE ONLY): 35 min  Assessment / Plan / Recommendation Clinical Impression  Second session completed as daughter arrived and requested information and son in pt's room.  Reviewed pt's dysphagia with CN deficits and compensation strategies in place.  Hopefully pt's worsening dysphagia is acute due to edema from XRT.  She continues with facial, hypoglossal nerve deficits *lingual deviation to left upon protrusion. Advised to focus on liquid nutrition for efficiency given the laborious process for pt to consume po.  If her dysphagia does not improve within the next few days - adequacy of nutrition will be compromised.  Son and daughter inquired re: feeding tube - and are encouraging pt to get one placed.  SLP confirmed pt could continue to eat if she chose feeding tube.  MBS can be conducted if MD desires *saw [facility] ordered 11/13 am* however it may not provide information that will change pt's current care plan.  MBS could however allow observation of pharyngeal swallow to assess for pharyngeal weakness/airway protection with po intake.  Reviewed with pt and family swallowing compensation strategies using teach back and written instructions.  Family reported understanding and pt is demonstrating/implementing strategies.     HPI HPI: Patient is a 61 y.o. female with PMH: HTN, DM-2, small cell lung cancer with known liver and brain metastasis (s/p chemotherapy). She presented to [facility] ED on 07/15/22 via EMS after being found down at home with seizure like activity. Family also reported transient resolving episodes of slurred speech. On EMS arrival, patient was reportedly mostly unresponsive with ongoing left sided partial seizure activity. She was intubated upon arrival to hospital. CT head showed multiple  hyperdense/hemorrhagic metastases (largest 2.6cm, unchanged) with edema of R frontal and parietal/occipital lobes. CT C-spine negative. She was transferred to [facility] for higher level of care. She was extubated on 07/18/22, had recurrence of seizures on 11/3. She was transferred to [facility] on 11/6 with plan to start whole brain XRT. Speech swallow evaluation ordered by patient's oncologist  (Dr. Marin Olp) after he had a discussion with patient's daughter on phone during which she reported patient has been having a lot of difficulty swallowing.      SLP Plan  Continue with current plan of care      Recommendations for follow up therapy are one component of a multi-disciplinary discharge planning process, led by the attending physician.  Recommendations may be updated based on patient status, additional functional criteria and insurance authorization.    Recommendations  Diet recommendations: Thin liquid;Nectar-thick liquid (focus on liquid nutrition for efficiency) Liquids provided via: Teaspoon;Cup;Straw Medication Administration: Other (Comment) (with icecream mixed with ensure - crushed) Supervision: Intermittent supervision to cue for compensatory strategies;Patient able to self feed Compensations: Small sips/bites;Slow rate;Monitor for anterior loss;Follow solids with liquid;Lingual sweep for clearance of pocketing;Other (Comment) Postural Changes and/or Swallow Maneuvers: Seated upright 90 degrees;Head tilt right during swallow (oral suction before, during or after meals as needed)                Oral Care Recommendations: Oral care before and after PO (oral suction within reach) Follow Up Recommendations: Follow physician's recommendations for discharge plan and follow up therapies Assistance recommended at discharge: Set up Supervision/Assistance SLP Visit Diagnosis: Dysphagia, oral phase (R13.11);Dysphagia, unspecified (R13.10) Plan: Continue with current plan of care         Pamela  K,  MS [facility] SLP Acute Rehab Services Office [phone] Pager [phone]   Pamela [name]  07/30/2022, 7:08 PM

## 2022-07-30 NOTE — Progress Notes (Signed)
Consultation Progress Note   Patient: Pamela [name] DTO:[id number] DOB: [birth year] DOA: 07/16/2022 DOS: the patient was seen and examined on 07/30/2022 Primary service: Pamela Nimrod, MD  Brief hospital course: 61 year old woman who presented to [facility] ED 10/30 via EMS after being found down at home with seizure-like activity. LKW 2040, found around 2245 having L-sided partial seizures and hyperglycemic to 300s. Recent transient spontaenously-resolving episodes of slurred speech at home per family. PMHx significant for HTN, T2DM, hypothyroidism, SCLC (s/p chemotherapy, followed by Dr. Marin [name]) with known liver metastasis and recently diagnosed brain metastasis with plan for whole brain radiation (not yet started).    On EMS arrival, patient was reportedly mostly unresponsive with ongoing L-sided partial seizure activity. Versed IV (35m total) administered without cessation of seizure. Labs were notable for WBC 11.6, H&H/Plt WNL, hyponatremia with Na 126, K 6.2, serum glucose 836, BUN 49, Cr 1.49 (baseline 0.9). LFTs elevated with AST/ALT 76/110, Alk Phos 226. INR WNL. UA with > 500 glucose; UDS +BZD (s/p Ativan/Versed). Ethanol negative. [facility]; Propofol gtt + Versed initiated. Neuro consulted; rapid EEG completed. Keppra load given + fosphenytoin. CT Head with multiple hyperdense/hemorrhagic metastases (largest 2.6cm, unchanged) with edema of R frontal and parietal/occipital lobes. CT C-spine negative.    [name] TRH to assume care 07/23/2022  11/11: Patient currently hemodynamically stable and tolerating the radiation therapy well.  No recent seizure.  Rad[facility]  Labs seems stable.  11/12; patient remained stable.  [name] most likely be  done in next couple of days.  CMP with blood glucose of 154, albumin 2.9, AST 45, ALT 49 and alkaline phosphatase 220, seems stable. CBC also seems stable with hemoglobin of 9.3, MCV 101, platelets 100.  11/13: Patient was having difficulty swallowing, swallow evaluation was obtained and she was having very difficulty moving food from anterior to the back of her mouth, most likely secondary to her brain lesions and now radiation.  Appears confused.  Per palliative care note she is very eminent to remain full code and full scope of care.  Discussed with patient and daughter regarding getting a PEG tube for nutritional support, they will discuss with each other and let uKoreaknow. [facility]  11/14: Repeat MRI with increase in the size of metastasis, no new lesion, per oncology it can happen sometimes with radiation the tumor increases in size with swelling and then shrink.  Significant swallowing difficulty and new focal deficit most likely secondary to brain mets.  Oncology increases the dose of Decadron and they will discuss with radiation oncology about the further plan. Labs overall stable.  Still does not want PEG tube, daughter is going to talk with her. Seems like unable to manage her on secretions at this time with continuous drooling.  Patient is very high risk for deterioration and mortality based on underlying life limiting comorbidities.  Assessment & Plan:    Status epilepticus ([name]  - [name] -> last focal seiure 07/20/22 eeg - ileptogenicity and cortical dysfunction arising from right temporoparietal region likely secondary to underlying mass. Additionally there was mild to moderate diffuse encephalopathy, nonspecific etiology. No seizures were seen during this study. Currently no seizures  -Patient was started on  Keppra, Vimpat and Dilantin by neurology. - if develops convulsive  status despite current AED regimen again may need elective intubation   - palliative care following-signed off today as patient would like to stay full code with full scope of care.  Oncology might asked them to revisit.   Hyperglycemia Due to steroids for vasogenic edema and uncontrolled type 2 DM  -CBG currently within goal. 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 -Patient was started on radiation -Oncology on board -Patient will need outpatient radiation arrangements at [facility] before discharge.   SCLC (small cell lung carcinoma) (HCC) Diagnosed 8 months ago at stage IV- Limited pathway forward -Oncology is on board  Difficulty swallowing.  Swallow evaluation was obtained, please see their note for full detail.  Most likely secondary to her recent brain lesions and radiation. Discussed having PEG tube for nutritional support with patient and daughter, they will let us know. -Continue with p.o. with swallow team recommendations to help with swallowing.   Thrombocytopenia  Improving.  HIT panel check on 10/6 was negative. -Continue to monitor   DVT prophylaxis: Lovenox  Code Status:  full code   Subjective: Patient was sitting in chair and drooling.  Having some lower back pain.  No nausea or vomiting.  I again asked about PEG tube and she was stating that she will discuss with her daughter.  Appears miserable.  Physical Exam: General.  Chronically ill-appearing lady, in no acute distress. Pulmonary.  Lungs clear bilaterally, normal respiratory effort. CV.  Regular rate and rhythm, no JVD, rub or murmur. Abdomen.  Soft, nontender, nondistended, BS positive. CNS.  Alert and oriented .   Extremities.  No edema, no cyanosis, pulses intact and symmetrical. Psychiatry.  Judgment and insight appears normal.              Vitals:     Vitals:    07/29/22 0455 07/29/22 1308 07/29/22 1947 07/30/22 0523  BP: 139/69 134/71 106/82 139/71  Pulse: 87 96 84 84  Resp: _0 Temp: 98.9 F (37.2 C) (!) 97.4 F (36.3 C) 97.7 F (36.5 C) 97.9 F (36.6 C)  TempSrc: Oral Oral  Oral  SpO2: 96% 94% 97% 96%  Weight:      Height:        Data Reviewed: Prior data reviewed  Family Communication: Discussed with daughter on phone  Time spent: 50 minutes.  This record has been created using Systems analyst. Errors have been sought and corrected,but may not always be located. Such creation errors do not reflect on the standard of care.   Author: Lorella Nimrod, MD 07/30/2022 8:12 AM  For on call review [email or web address].

## 2022-07-30 NOTE — Progress Notes (Signed)
Initial Nutrition Assessment  DOCUMENTATION CODES:  Not applicable  INTERVENTION:  -Provide po diet and supplements per SLP recommendations -If appropriate trial Ensure Max (with or without ice cream) to provide 150-280kcal, 30-32g protein, and 6-21g CHO. -If consistent with POC goals, recommend NGT or PEG placement for nutrition support. See below for formula recommendations.  NUTRITION DIAGNOSIS:  Inadequate oral intake related to dysphagia, other (see comment) (AMS) as evidenced by other (comment) (meeting <50% estimated needs for ~ 5 days).  GOAL:  Patient will meet greater than or equal to 90% of their needs  MONITOR:  PO intake, TF tolerance, Supplement acceptance  REASON FOR ASSESSMENT:  Consult Assessment of nutrition requirement/status  ASSESSMENT:  Pt is a 61yo F with PMH of HTN, T2DM, hypothyroidism, SCLC (s/p chemotherapy, followed by Dr. Marin Olp) with known liver metastasis and recently diagnosed brain metastasis receiving whole brain radiation who presents with seizure like activity.  Pt with a change in mental status due to increased size of brain metastasis, possibly due to swelling. Steroid dose increased today. Pt is now having difficulty swallowing. Spoke with nurse tech about po intake at lunch. Pt tried about 3 bites of oatmeal but could not swallow and just pushed it back out with her tongue. Pt was sitting up in chair with drool/secretions hanging from mouth when RD entered. RN helped pt to bathroom, but pt appeared to have difficulty seeing where she was going. Pt has been refusing PEG. Per chart review, plan for daughter to discuss with pt. Discussed with RN different ONS we could try. However, recommend follow up with SLP prior to trying po intake. Reached out to SLP to update on condition. Per chart review, pt with decline in intake over the last ~5 days.   Recommend NGT vs PEG placement for nutrition support if within POC goals. If EN is needed, recommend  initiating Osmolite 1.5 @ 40mL/hr with Prosource TF20 q day. As tolerated, increase EN by 66mL q 12hrs to goal rate of 6mL/hr x 24hrs (1318mL total volume) with Prosource q day to provide 2060kcal, 103g protein and 1026mL free water. To meet fluid needs, recommend 110mL FWF q 4hrs to provide an additional 783mL free water.   Diet Order:   Diet Order             Diet Carb Modified Fluid consistency: Thin; Room service appropriate? Yes  Diet effective now                   EDUCATION NEEDS:  Not appropriate for education at this time  Skin:  Skin Assessment: Reviewed RN Assessment  Last BM:  11/14  Height:  Ht Readings from Last 1 Encounters:  07/16/22 5\' 2"  (1.575 m)   Weight:  Wt Readings from Last 1 Encounters:  07/28/22 82.2 kg   Ideal Body Weight:  50 kg  BMI:  Body mass index is 33.14 kg/m.  Estimated Nutritional Needs:  Kcal:  1910-2300kcal (25-30kcal/kg admission weight) Protein:  90-115g (1.2-1.5g/kg admission weight) Fluid:  1750-1942mL  Candise Bowens, MS, RD, LDN, CNSC See AMiON for contact information

## 2022-07-30 NOTE — Telephone Encounter (Signed)
I spoke with Ms. Negrette daughter in law, Edgar Frisk, about the patient's recent brain MRI results and the plans to re-scan on 12/14 followed by a visit with Dr. Isidore Moos and palliative medicine same day.  The scan has been scheduled closer to home, at [facility] and I have sent a referral request to the palliative team for a visit on 12/19.   Anderson Malta has my contact information and will let me know if anything changes, but for now she is in agreement with the above plan.   Mont Dutton R.T.(R)(T) Radiation Special Procedures Navigator

## 2022-07-30 NOTE — Progress Notes (Signed)
Speech Language Pathology Treatment: Dysphagia  Patient Details Name: Pamela [name] MRN: [id number] DOB: [birth year] Today's Date: 07/30/2022 Time: 5732-2025 SLP Time Calculation (min) (ACUTE ONLY): 35 min  Assessment / Plan / Recommendation Clinical Impression  Pt seen to address dysphagia goals.  This is SLPs first visit with pt- pt admits to worsening issues with drooling and swallowing and states she last had solids on Saturday * sausage/egg biscuit* and could not eat the bread.  Pt with wet voice noted during session indicative of secretion aspiration - and she can clear with cued throat clear/cough.    Skilled SLP trials of textures with compensations completed.  Pt best tolerates liquids - eg liquid nutritional supplement mixed with icecream *CHOCOLATE* using tsp with head tilt to the right *stronger lingual musculature.  This method improves her oral containment and thus ability to transit bolus into pharynx.  Use of straw with thicker drinks caused her discomfort - and ill fitting dentures present.  Thin liquids such as water and gingerale were swallowed via cup/straw and tsp.  Suspect pt has some level of initiation/motor planning issues in addition to facial/lingual weakness contributing  to her dysphagia symptoms.  Subtle cough observed with approx 20% of thin liquid trials.  RN reports pt with decreased but clear lungs - thus pt clinically is tolerating potential minimal aspiration of liquids.  Did not attempt solids with pt due to her difficulty with liquids.  Recommend to maximize liquid nutrition - and pt enjoys chocolate.  Will follow for dysphagia management. Educated granddaughter and pt to findings/compensations.    HPI HPI: Patient is a 61 y.o. female with PMH: HTN, DM-2, small cell lung cancer with known liver and brain metastasis (s/p chemotherapy). She presented to [facility] ED on 07/15/22 via EMS after being found down at home with seizure like activity. Family also reported transient  resolving episodes of slurred speech. On EMS arrival, patient was reportedly mostly unresponsive with ongoing left sided partial seizure activity. She was intubated upon arrival to hospital. CT head showed multiple hyperdense/hemorrhagic metastases (largest 2.6cm, unchanged) with edema of R frontal and parietal/occipital lobes. CT C-spine negative. She was transferred to [facility] for higher level of care. She was extubated on 07/18/22, had recurrence of seizures on 11/3. She was transferred to [facility] on 11/6 with plan to start whole brain XRT. Speech swallow evaluation ordered by patient's oncologist  (Dr. Marin Olp) after he had a discussion with patient's daughter on phone during which she reported patient has been having a lot of difficulty swallowing.      SLP Plan  Continue with current plan of care      Recommendations for follow up therapy are one component of a multi-disciplinary discharge planning process, led by the attending physician.  Recommendations may be updated based on patient status, additional functional criteria and insurance authorization.    Recommendations  Diet recommendations: Nectar-thick liquid;Thin liquid (focus on liquid nutrition) Liquids provided via: Teaspoon;Cup;Straw Medication Administration: Other (Comment) (with icecream mixed with ensure - crushed) Supervision: Intermittent supervision to cue for compensatory strategies;Patient able to self feed Compensations: Small sips/bites;Slow rate;Monitor for anterior loss;Follow solids with liquid;Lingual sweep for clearance of pocketing;Other (Comment) Postural Changes and/or Swallow Maneuvers: Head turn right during swallow;Seated upright 90 degrees (oral suction before, during or after meals as needed)                Oral Care Recommendations: Oral care BID Follow Up Recommendations: Follow physician's recommendations for discharge plan and follow up therapies  Assistance recommended at discharge: Set up  Supervision/Assistance SLP Visit Diagnosis: Dysphagia, oral phase (R13.11);Dysphagia, unspecified (R13.10) Plan: Continue with current plan of care         Kathleen Lime, MS [place] Office [phone] Pager [phone]   Macario Golds  07/30/2022, 6:50 PM

## 2022-07-30 NOTE — Plan of Care (Signed)
  Problem: Education: Goal: Knowledge of General Education information will improve Description: Including pain rating scale, medication(s)/side effects and non-pharmacologic comfort measures Outcome: Progressing   Problem: Clinical Measurements: Goal: Will remain free from infection Outcome: Progressing Goal: Respiratory complications will improve Outcome: Progressing Goal: Cardiovascular complication will be avoided Outcome: Progressing   Problem: Activity: Goal: Risk for activity intolerance will decrease Outcome: Progressing   Problem: Nutrition: Goal: Adequate nutrition will be maintained Outcome: Progressing   Problem: Elimination: Goal: Will not experience complications related to urinary retention Outcome: Progressing   Problem: Pain Managment: Goal: General experience of comfort will improve Outcome: Progressing   Problem: Skin Integrity: Goal: Risk for impaired skin integrity will decrease Outcome: Progressing   Problem: Safety: Goal: Non-violent Restraint(s) Outcome: Not Applicable

## 2022-07-31 ENCOUNTER — Other Ambulatory Visit: Payer: Self-pay

## 2022-07-31 ENCOUNTER — Ambulatory Visit
Admit: 2022-07-31 | Discharge: 2022-07-31 | Disposition: A | Discharge status: Home / Self Care | Payer: BC Managed Care – PPO | Attending: Radiation Oncology | Admitting: Radiation Oncology

## 2022-07-31 ENCOUNTER — Ambulatory Visit: Payer: BC Managed Care – PPO

## 2022-07-31 DIAGNOSIS — Z8589 Personal history of malignant neoplasm of other organs and systems: Secondary | ICD-10-CM | POA: Diagnosis not present

## 2022-07-31 DIAGNOSIS — G40901 Epilepsy, unspecified, not intractable, with status epilepticus: Secondary | ICD-10-CM | POA: Diagnosis not present

## 2022-07-31 DIAGNOSIS — Z515 Encounter for palliative care: Secondary | ICD-10-CM | POA: Diagnosis not present

## 2022-07-31 LAB — RAD ONC ARIA SESSION SUMMARY
Course Elapsed Days: 9
Plan Fractions Treated to Date: 8
Plan Prescribed Dose Per Fraction: 3 Gy
Plan Total Fractions Prescribed: 10
Plan Total Prescribed Dose: 30 Gy
Reference Point Dosage Given to Date: 24 Gy
Reference Point Session Dosage Given: 3 Gy
Session Number: 8

## 2022-07-31 LAB — PHENYTOIN LEVEL, FREE AND TOTAL
Phenytoin, Free: 1.4 ug/mL (ref 1.0–2.0)
Phenytoin, Total: 12.2 ug/mL (ref 10.0–20.0)

## 2022-07-31 LAB — GLUCOSE, CAPILLARY
Glucose-Capillary: 125 mg/dL — ABNORMAL HIGH (ref 70–99)
Glucose-Capillary: 172 mg/dL — ABNORMAL HIGH (ref 70–99)
Glucose-Capillary: 118 mg/dL — ABNORMAL HIGH (ref 70–99)
Glucose-Capillary: 113 mg/dL — ABNORMAL HIGH (ref 70–99)

## 2022-07-31 MED ORDER — PHENYTOIN 125 MG/5ML PO SUSP
200.0000 mg | Freq: Two times a day (BID) | ORAL | Status: DC
  Administered 2022-07-31 – 2022-08-02 (×5): 200 mg via ORAL
  Filled 2022-07-31 (×5): qty 8

## 2022-07-31 MED ORDER — ENSURE MAX PROTEIN PO LIQD
11.0000 [oz_av] | Freq: Two times a day (BID) | ORAL | Status: DC
  Administered 2022-07-31 – 2022-08-02 (×5): 11 [oz_av] via ORAL
  Filled 2022-07-31 (×5): qty 330

## 2022-07-31 NOTE — Progress Notes (Signed)
Mobility Specialist - Progress Note   07/31/22 1457  Mobility  Activity Ambulated with assistance in hallway  Level of Assistance Contact guard assist, steadying assist  Assistive Device Front wheel walker  Distance Ambulated (ft) 250 ft  Range of Motion/Exercises Active  Activity Response Tolerated well  Mobility Referral Yes  $Mobility charge 1 Mobility   Pt was found on recliner chair and agreeable to ambulate. Still requires cues when ambulating to avoid hitting the wall or objects and complained of lower back pain. At EOS returned to recliner chair with necessities in reach.  Ferd Hibbs Mobility Specialist

## 2022-07-31 NOTE — Progress Notes (Signed)
Pamela [name] is about the same.  She seems to had some speech difficulties.  Again, I suspect this is from the metastasis to the brain.  She does not wish to have a PEG tube put in.  I certainly cannot argue with this.  She is being followed by speech pathology with respect to her swallowing.  She is being followed by physical therapy.  She is being followed by dietary service.  She is doing okay with radiation.  She really had no complications from the radiation.  She has had no seizures.  She is on 3 different seizure medications.  We increased her Decadron yesterday.  She is now on 4 times a day Decadron.  There is been no problems with bowel or bladder incontinence.  She has her pain is doing a little bit better.  She has had no fever.  There has been no nausea or vomiting.  No labs yet today.  Her vital signs show temperature of 98.8.  Pulse 79.  Blood pressure 119/40.  Her head and neck exam shows no scleral icterus.  She seems to have little bit of decreased rings on the right side of her face.  Her lungs sound clear bilaterally.  Cardiac exam regular rate and rhythm.  She has 1/6 systolic ejection murmur.  Abdomen is soft.  Bowel sounds are present.  There is no fluid wave.  Extremity shows no clubbing, cyanosis or edema.  She may have a little bit of decreased strength overall in the left side.  Neurological exam does show the above deficits.  Pamela [name] has a metastatic small cell lung cancer.  She has extensive brain mets.  She is on radiation.  Again is hard to say whether or not there is any progression despite radiation.  There are no new lesions in the brain which is encouraging.  Again, sometimes with radiation, tumors can swell before they shrink.  I will continue her radiation.  I suspect she probably will need radiation as an inpatient.  I know she wants to go home.  I just think that is going to be difficult for her to be at home right now.  We will have to see what her labs  look like.  I know that she is getting outstanding care from everybody upon 4 E.  [name], MD  Psalms 147:3

## 2022-07-31 NOTE — Progress Notes (Signed)
  Progress Note   Patient: Pamela [name]:[id number] DOB: 1960-10-13 DOA: 07/16/2022     15 DOS: the patient was seen and examined on 07/31/2022   Brief hospital course: 61 year old woman who presented to [facility] ED 10/30 via EMS after being found down at home with seizure-like activity. LKW 2040, found around 2245 having L-sided partial seizures and hyperglycemic to 300s. Recent transient spontaenously-resolving episodes of slurred speech at home per family. PMHx significant for HTN, T2DM, hypothyroidism, SCLC (s/p chemotherapy, followed by Dr. Marin Olp) with known liver metastasis and recently diagnosed brain metastasis While inpatient, radiation was being arranged. Pt had concerns of difficulty swallowing. Per Palliative Care, pt would not want PEG and wishes to remain full code, full scope of tx  Assessment and Plan: Status epilepticus ([place])  - Likely secondary to cerebral metastases from [place] -07/22/22 -> last focal seiure 07/20/22 eeg - ileptogenicity and cortical dysfunction arising from right temporoparietal region likely secondary to underlying mass. Additionally there was mild to moderate diffuse encephalopathy, nonspecific etiology. No seizures were seen during this study. Currently no seizures  -Continued on Keppra, Vimpat and Dilantin by neurology. - palliative care had been following and signed off as of 11/13 as patient would like to stay full code with full scope of care -Oncology following   Hyperglycemia -Due to steroids for vasogenic edema and uncontrolled type 2 DM  -CBG currently within goal. 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  Swallow evaluation was obtained, please see their note for  full detail.  Most likely secondary to her recent brain lesions and radiation. -Dr. Reesa Chew discussed having PEG tube for nutritional support with patient and daughter -SLP following, recs for thin liquids, take small bites   Thrombocytopenia  HIT panel check on 10/6 was negative. -Plts stable this AM      Subjective: Eager to go home soon  Physical Exam: Vitals:   07/30/22 1230 07/30/22 2138 07/31/22 0510 07/31/22 1412  BP: 133/69 133/76 (!) 119/40 130/73  Pulse: 88 90 79 87  Resp: (!) 25 20 20 20   Temp: 97.9 F (36.6 C) 98.4 F (36.9 C) 98.8 F (37.1 C) 99 F (37.2 C)  TempSrc: Oral Oral Oral Oral  SpO2:  94% 96% 97%  Weight:      Height:       General exam: Awake, sitting in chair in nad, appears groggy Respiratory system: Normal respiratory effort, no wheezing Cardiovascular system: regular rate, s1, s2 Gastrointestinal system: Soft, nondistended, positive BS Central nervous system: CN2-12 grossly intact, strength intact Extremities: Perfused, no clubbing Skin: Normal skin turgor, no notable skin lesions seen Psychiatry: Difficult to assess given mentation  Data Reviewed:  Labs reviewed: Na 133, K 4.4, Cr 0.76, Hgb 9.3   Family Communication: Pt in room, family at bedside  Disposition: Status is: Inpatient Remains inpatient appropriate because: Severity of illness  Planned Discharge Destination: Home   Author: Marylu Lund, MD 07/31/2022 5:20 PM  For on call review [email or web address].

## 2022-08-01 ENCOUNTER — Other Ambulatory Visit: Payer: Self-pay

## 2022-08-01 ENCOUNTER — Ambulatory Visit: Payer: BC Managed Care – PPO

## 2022-08-01 ENCOUNTER — Ambulatory Visit
Admit: 2022-08-01 | Discharge: 2022-08-01 | Disposition: A | Discharge status: Home / Self Care | Payer: BC Managed Care – PPO | Attending: Radiation Oncology | Admitting: Radiation Oncology

## 2022-08-01 DIAGNOSIS — Z515 Encounter for palliative care: Secondary | ICD-10-CM | POA: Diagnosis not present

## 2022-08-01 DIAGNOSIS — G40901 Epilepsy, unspecified, not intractable, with status epilepticus: Secondary | ICD-10-CM | POA: Diagnosis not present

## 2022-08-01 DIAGNOSIS — C7931 Secondary malignant neoplasm of brain: Secondary | ICD-10-CM | POA: Diagnosis not present

## 2022-08-01 LAB — RAD ONC ARIA SESSION SUMMARY
Course Elapsed Days: 10
Plan Fractions Treated to Date: 9
Plan Prescribed Dose Per Fraction: 3 Gy
Plan Total Fractions Prescribed: 10
Plan Total Prescribed Dose: 30 Gy
Reference Point Dosage Given to Date: 27 Gy
Reference Point Session Dosage Given: 3 Gy
Session Number: 9

## 2022-08-01 LAB — CBC WITH DIFFERENTIAL/PLATELET
Abs Immature Granulocytes: 0.13 10*3/uL — ABNORMAL HIGH (ref 0.00–0.07)
Basophils Absolute: 0 10*3/uL (ref 0.0–0.1)
Basophils Relative: 1 %
Eosinophils Absolute: 0.1 10*3/uL (ref 0.0–0.5)
Eosinophils Relative: 2 %
HCT: 27.4 % — ABNORMAL LOW (ref 36.0–46.0)
Hemoglobin: 8.9 g/dL — ABNORMAL LOW (ref 12.0–15.0)
Immature Granulocytes: 2 %
Lymphocytes Relative: 11 %
Lymphs Abs: 0.7 10*3/uL (ref 0.7–4.0)
MCH: 32.7 pg (ref 26.0–34.0)
MCHC: 32.5 g/dL (ref 30.0–36.0)
MCV: 100.7 fL — ABNORMAL HIGH (ref 80.0–100.0)
Monocytes Absolute: 0.5 10*3/uL (ref 0.1–1.0)
Monocytes Relative: 8 %
Neutro Abs: 4.8 10*3/uL (ref 1.7–7.7)
Neutrophils Relative %: 76 %
Platelets: 106 10*3/uL — ABNORMAL LOW (ref 150–400)
RBC: 2.72 MIL/uL — ABNORMAL LOW (ref 3.87–5.11)
RDW: 15.7 % — ABNORMAL HIGH (ref 11.5–15.5)
WBC: 6.2 10*3/uL (ref 4.0–10.5)
nRBC: 0 % (ref 0.0–0.2)

## 2022-08-01 LAB — COMPREHENSIVE METABOLIC PANEL
ALT: 42 U/L (ref 0–44)
AST: 51 U/L — ABNORMAL HIGH (ref 15–41)
Albumin: 3 g/dL — ABNORMAL LOW (ref 3.5–5.0)
Alkaline Phosphatase: 245 U/L — ABNORMAL HIGH (ref 38–126)
Anion gap: 9 (ref 5–15)
BUN: 17 mg/dL (ref 8–23)
CO2: 27 mmol/L (ref 22–32)
Calcium: 9.1 mg/dL (ref 8.9–10.3)
Chloride: 99 mmol/L (ref 98–111)
Creatinine, Ser: 0.78 mg/dL (ref 0.44–1.00)
GFR, Estimated: >60 mL/min (ref >60)
Glucose, Bld: 111 mg/dL — ABNORMAL HIGH (ref 70–99)
Potassium: 4.1 mmol/L (ref 3.5–5.1)
Sodium: 135 mmol/L (ref 135–145)
Total Bilirubin: 1 mg/dL (ref 0.3–1.2)
Total Protein: 6.9 g/dL (ref 6.5–8.1)

## 2022-08-01 LAB — GLUCOSE, CAPILLARY
Glucose-Capillary: 174 mg/dL — ABNORMAL HIGH (ref 70–99)
Glucose-Capillary: 155 mg/dL — ABNORMAL HIGH (ref 70–99)
Glucose-Capillary: 105 mg/dL — ABNORMAL HIGH (ref 70–99)
Glucose-Capillary: 110 mg/dL — ABNORMAL HIGH (ref 70–99)

## 2022-08-01 LAB — LACTATE DEHYDROGENASE: LDH: 278 U/L — ABNORMAL HIGH (ref 98–192)

## 2022-08-01 NOTE — Progress Notes (Signed)
  Progress Note   Patient: Pamela [name] XKG:[id number] DOB: 24-May-1961 DOA: 07/16/2022     16 DOS: the patient was seen and examined on 08/01/2022   Brief hospital course: 61 year old woman who presented to [facility] ED 10/30 via EMS after being found down at home with seizure-like activity. LKW 2040, found around 2245 having L-sided partial seizures and hyperglycemic to 300s. Recent transient spontaenously-resolving episodes of slurred speech at home per family. PMHx significant for HTN, T2DM, hypothyroidism, SCLC (s/p chemotherapy, followed by Dr. Marin Olp) with known liver metastasis and recently diagnosed brain metastasis While inpatient, radiation was being arranged. Pt had concerns of difficulty swallowing. Per Palliative Care, pt would not want PEG and wishes to remain full code, full scope of tx  Assessment and Plan: Status epilepticus ([place])  - Likely secondary to cerebral metastases from [place] -07/22/22 -> last focal seiure 07/20/22 eeg - ileptogenicity and cortical dysfunction arising from right temporoparietal region likely secondary to underlying mass. Additionally there was mild to moderate diffuse encephalopathy, nonspecific etiology. No seizures were seen during this study. Currently no seizures  -Continued on Keppra, Vimpat and Dilantin by neurology. - palliative care had been following and signed off as of 11/13 as patient would like to stay full code with full scope of care -Oncology following, recs to continue radiation tx   Hyperglycemia -Due to steroids for vasogenic edema and uncontrolled type 2 DM  -CBG currently within goal. 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  Swallow evaluation was obtained, please see their note for full detail.  Most likely secondary to her recent brain lesions and radiation. -Dr. Reesa Chew discussed having PEG tube for nutritional support with patient and daughter -SLP following, recs for thin liquids, take small bites -Pt reports being better able to tolerate liquids and even bag of chips    Thrombocytopenia  HIT panel check on 10/6 was negative. -Plts remain stable      Subjective: Hoping to go home soon. States she is in charge of upcoming Thanksgiving meal and needs to be home to begin to prepare.  Physical Exam: Vitals:   07/31/22 2010 08/01/22 0430 08/01/22 0813 08/01/22 1349  BP: (!) 115/58 (!) 146/71 136/62 (!) 151/104  Pulse: 82 86 77 86  Resp: 20 20 18 19   Temp: 97.6 F (36.4 C) (!) 97.3 F (36.3 C) 98.3 F (36.8 C)   TempSrc: Axillary Oral Oral   SpO2: 97% 94% 96% 97%  Weight:      Height:       General exam: Conversant, in no acute distress Respiratory system: normal chest rise, clear, no audible wheezing Cardiovascular system: regular rhythm, s1-s2 Gastrointestinal system: Nondistended, nontender, pos BS Central nervous system: No seizures, no tremors Extremities: No cyanosis, no joint deformities Skin: No rashes, no pallor Psychiatry: Affect normal // no auditory hallucinations   Data Reviewed:  Labs reviewed: Na 135, K 4.1, Cr 0.78, Hgb 8.9   Family Communication: Pt in room, family not at bedside  Disposition: Status is: Inpatient Remains inpatient appropriate because: Severity of illness  Planned Discharge Destination: Home   Author: Marylu Lund, MD 08/01/2022 5:03 PM  For on call review [email or web address].

## 2022-08-01 NOTE — Progress Notes (Signed)
Overall, Pamela [name] is about the same.  I am unsure how well she is able to swallow.  I do not think she is aspirating.  She does enjoy the Ensure shakes and the Ensure plus.  She is getting radiation therapy.  She is doing okay with radiation therapy.  She continues on 3 different seizure medications.  Patient is on Decadron 4 times a day.  She has had no problems with pain.  She has had no bowel or bladder incontinence.  She has had no fever.  There is been no bleeding.  She still has some speech issues.  Her labs show white count 6.2.  Hemoglobin 8.9.  Platelet count 106,000.  BUN is 17 creatinine 0.78.  Calcium 9.1.  Albumin is 3.0.  She has had no cough.  There is no shortness of breath.  She has had no bleeding.  There has been no leg swelling.  Vital signs show temperature 97.3.  Pulse 86.  Blood pressure 146/71.  Her lungs sound clear bilaterally.  Cardiac exam regular rate and rhythm.  She has 1/6 systolic ejection murmur.  Abdomen is soft.  Bowel sounds are present.  There is no fluid wave.  Neurological exam does show little bit of weakness on the right side of the face.  From what she is tells me, she will finish radiation tomorrow.  I am not sure if this is actually the case.  I would think that she would finish up next week.  Again, nutrition is can be the key as to how well she will do I think.  It is hard to say if the neurological status has improved.  At least, she is not aspirating.  She is still quite alert.  She we will try to do what you ask her to do.  She has always been motivated.  I know that she is getting fantastic care from everybody up on [address] E.  I appreciate their help.  [name], MD  Colossians 3:15

## 2022-08-01 NOTE — Progress Notes (Signed)
Mobility Specialist - Progress Note   08/01/22 0937  Mobility  Activity Ambulated with assistance in hallway  Level of Assistance Contact guard assist, steadying assist  Assistive Device Front wheel walker  Distance Ambulated (ft) 480 ft  Range of Motion/Exercises Active  Activity Response Tolerated well  Mobility Referral Yes  $Mobility charge 1 Mobility   Pt was found on recliner chair and agreeable to ambulate. Still reacquiring cues when ambulating to avoid hitting objects/wall. Stated that she feels much better today and only hit 1 object today. At EOS returned to recliner chair with necessities in reach.  Ferd Hibbs Mobility Specialist

## 2022-08-01 NOTE — Progress Notes (Signed)
PT Cancellation Note  Patient Details Name: Pamela [name] MRN: [id number] DOB: [birth year]   Cancelled Treatment:    Reason Eval/Treat Not Completed: Patient at procedure or test/unavailable (pt out of room for radiation.) Will follow.    Blondell Reveal Kistler PT 08/01/2022  Acute Rehabilitation Services  Office [phone]

## 2022-08-01 NOTE — Progress Notes (Signed)
Mobility Specialist - Progress Note   08/01/22 1347  Mobility  Activity Ambulated with assistance in hallway  Level of Assistance Contact guard assist, steadying assist  Assistive Device Front wheel walker  Distance Ambulated (ft) 480 ft  Range of Motion/Exercises Active  Activity Response Tolerated well  Mobility Referral Yes  $Mobility charge 1 Mobility   Pt was found on recliner chair and agreeable to ambulate. Still requires cues when ambulating and at EOS returned to recliner chair with necessities in reach.  Ferd Hibbs Mobility Specialist

## 2022-08-01 NOTE — Progress Notes (Signed)
Speech Language Pathology Treatment: Dysphagia  Patient Details Name: Pamela [name] MRN: [id number] DOB: April 22, 1961 Today's Date: 08/01/2022 Time: 8099-8338 SLP Time Calculation (min) (ACUTE ONLY): 39 min  Assessment / Plan / Recommendation Clinical Impression  Pt making excellent progress with use of compensation strategies for most efficient intake.  DIL present and reports pt implementing compensation strategies with much improved oral retention.  Pt observed with Ensure mixed with ice cream and a bit of [place] via spoon with min cues for compensation strategies and no indication of aspiration.  She has wet voice at baseline due to her aspiration of secretions and cued cough clears voice. Oral cavity on left filled with food retained from breakfast without pt awareness.  Provided pt with mirror to use to assure clear of food especially before going to XRT.  Pt able to suction her mouth with min assistance - she would like Oral suction for home.  Instructed pt and DIL to have pt brush her gums/tongue due to secretion aspiration.    HPI HPI: Patient is a 61 y.o. female with PMH: HTN, DM-2, small cell lung cancer with known liver and brain metastasis (s/p chemotherapy). She presented to [facility] ED on 07/15/22 via EMS after being found down at home with seizure like activity. Family also reported transient resolving episodes of slurred speech. On EMS arrival, patient was reportedly mostly unresponsive with ongoing left sided partial seizure activity. She was intubated upon arrival to hospital. CT head showed multiple hyperdense/hemorrhagic metastases (largest 2.6cm, unchanged) with edema of R frontal and parietal/occipital lobes. CT C-spine negative. She was transferred to [facility] for higher level of care. She was extubated on 07/18/22, had recurrence of seizures on 11/3. She was transferred to [facility] on 11/6 with plan to start whole brain XRT. Speech swallow evaluation ordered by patient's oncologist  (Dr. Marin Olp)  after he had a discussion with patient's daughter on phone during which she reported patient has been having a lot of difficulty swallowing.      SLP Plan  Continue with current plan of care      Recommendations for follow up therapy are one component of a multi-disciplinary discharge planning process, led by the attending physician.  Recommendations may be updated based on patient status, additional functional criteria and insurance authorization.    Recommendations  Diet recommendations: Thin liquid;Nectar-thick liquid Liquids provided via: Teaspoon;Cup;Straw Supervision: Intermittent supervision to cue for compensatory strategies;Patient able to self feed Compensations: Small sips/bites;Slow rate;Monitor for anterior loss;Follow solids with liquid;Lingual sweep for clearance of pocketing;Other (Comment) Postural Changes and/or Swallow Maneuvers: Seated upright 90 degrees;Head tilt right during swallow                Oral Care Recommendations: Oral care before and after PO Follow Up Recommendations: Follow physician's recommendations for discharge plan and follow up therapies Assistance recommended at discharge: Set up Supervision/Assistance SLP Visit Diagnosis: Dysphagia, oral phase (R13.11);Dysphagia, unspecified (R13.10) Plan: Continue with current plan of care         Kathleen Lime, MS [place] Office [phone] Pager [phone]   Macario Golds  08/01/2022, 12:44 PM

## 2022-08-02 ENCOUNTER — Ambulatory Visit: Payer: BC Managed Care – PPO

## 2022-08-02 ENCOUNTER — Other Ambulatory Visit: Payer: Self-pay

## 2022-08-02 ENCOUNTER — Encounter: Payer: Self-pay | Admitting: *Deleted

## 2022-08-02 ENCOUNTER — Encounter: Payer: Self-pay | Admitting: Radiation Oncology

## 2022-08-02 ENCOUNTER — Ambulatory Visit
Admit: 2022-08-02 | Discharge: 2022-08-02 | Disposition: A | Discharge status: Home / Self Care | Payer: BC Managed Care – PPO | Attending: Radiation Oncology | Admitting: Radiation Oncology

## 2022-08-02 DIAGNOSIS — G40901 Epilepsy, unspecified, not intractable, with status epilepticus: Secondary | ICD-10-CM | POA: Diagnosis not present

## 2022-08-02 DIAGNOSIS — Z515 Encounter for palliative care: Secondary | ICD-10-CM | POA: Diagnosis not present

## 2022-08-02 DIAGNOSIS — C7931 Secondary malignant neoplasm of brain: Secondary | ICD-10-CM | POA: Diagnosis not present

## 2022-08-02 LAB — COMPREHENSIVE METABOLIC PANEL
ALT: 49 U/L — ABNORMAL HIGH (ref 0–44)
AST: 59 U/L — ABNORMAL HIGH (ref 15–41)
Albumin: 3 g/dL — ABNORMAL LOW (ref 3.5–5.0)
Alkaline Phosphatase: 258 U/L — ABNORMAL HIGH (ref 38–126)
Anion gap: 11 (ref 5–15)
BUN: 14 mg/dL (ref 8–23)
CO2: 25 mmol/L (ref 22–32)
Calcium: 9.4 mg/dL (ref 8.9–10.3)
Chloride: 100 mmol/L (ref 98–111)
Creatinine, Ser: 0.64 mg/dL (ref 0.44–1.00)
GFR, Estimated: >60 mL/min (ref >60)
Glucose, Bld: 104 mg/dL — ABNORMAL HIGH (ref 70–99)
Potassium: 4.2 mmol/L (ref 3.5–5.1)
Sodium: 136 mmol/L (ref 135–145)
Total Bilirubin: 0.7 mg/dL (ref 0.3–1.2)
Total Protein: 7.2 g/dL (ref 6.5–8.1)

## 2022-08-02 LAB — CBC WITH DIFFERENTIAL/PLATELET
Abs Immature Granulocytes: 0.11 10*3/uL — ABNORMAL HIGH (ref 0.00–0.07)
Basophils Absolute: 0 10*3/uL (ref 0.0–0.1)
Basophils Relative: 1 %
Eosinophils Absolute: 0.1 10*3/uL (ref 0.0–0.5)
Eosinophils Relative: 2 %
HCT: 29.4 % — ABNORMAL LOW (ref 36.0–46.0)
Hemoglobin: 9.4 g/dL — ABNORMAL LOW (ref 12.0–15.0)
Immature Granulocytes: 2 %
Lymphocytes Relative: 14 %
Lymphs Abs: 0.8 10*3/uL (ref 0.7–4.0)
MCH: 32.4 pg (ref 26.0–34.0)
MCHC: 32 g/dL (ref 30.0–36.0)
MCV: 101.4 fL — ABNORMAL HIGH (ref 80.0–100.0)
Monocytes Absolute: 0.5 10*3/uL (ref 0.1–1.0)
Monocytes Relative: 8 %
Neutro Abs: 4.2 10*3/uL (ref 1.7–7.7)
Neutrophils Relative %: 73 %
Platelets: 124 10*3/uL — ABNORMAL LOW (ref 150–400)
RBC: 2.9 MIL/uL — ABNORMAL LOW (ref 3.87–5.11)
RDW: 15.8 % — ABNORMAL HIGH (ref 11.5–15.5)
WBC: 5.7 10*3/uL (ref 4.0–10.5)
nRBC: 0 % (ref 0.0–0.2)

## 2022-08-02 LAB — RAD ONC ARIA SESSION SUMMARY
Course Elapsed Days: 11
Plan Fractions Treated to Date: 10
Plan Prescribed Dose Per Fraction: 3 Gy
Plan Total Fractions Prescribed: 10
Plan Total Prescribed Dose: 30 Gy
Reference Point Dosage Given to Date: 30 Gy
Reference Point Session Dosage Given: 3 Gy
Session Number: 10

## 2022-08-02 LAB — GLUCOSE, CAPILLARY
Glucose-Capillary: 158 mg/dL — ABNORMAL HIGH (ref 70–99)
Glucose-Capillary: 133 mg/dL — ABNORMAL HIGH (ref 70–99)

## 2022-08-02 LAB — LACTATE DEHYDROGENASE: LDH: 317 U/L — ABNORMAL HIGH (ref 98–192)

## 2022-08-02 MED ORDER — DEXAMETHASONE 4 MG PO TABS: 4.0000 mg | ORAL_TABLET | Freq: Four times a day (QID) | ORAL | 0 refills | Status: DC

## 2022-08-02 MED ORDER — LEVETIRACETAM 750 MG PO TABS: 1500.0000 mg | ORAL_TABLET | Freq: Two times a day (BID) | ORAL | 0 refills | Status: DC

## 2022-08-02 MED ORDER — PANTOPRAZOLE SODIUM 40 MG PO TBEC: 40.0000 mg | DELAYED_RELEASE_TABLET | Freq: Every day | ORAL | 0 refills | Status: DC

## 2022-08-02 MED ORDER — LACOSAMIDE 200 MG PO TABS: 200.0000 mg | ORAL_TABLET | Freq: Two times a day (BID) | ORAL | 0 refills | Status: DC

## 2022-08-02 MED ORDER — PHENYTOIN 125 MG/5ML PO SUSP: 200.0000 mg | Freq: Two times a day (BID) | ORAL | 0 refills | Status: DC

## 2022-08-02 NOTE — Discharge Summary (Signed)
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 *  Hospital Course: 61 year old woman who presented to [facility] ED 10/30 via EMS after being found down at home with seizure-like activity. LKW 2040, found around 2245 having L-sided partial seizures and hyperglycemic to 300s. Recent transient spontaenously-resolving episodes of slurred speech at home per family. PMHx significant for HTN, T2DM, hypothyroidism, SCLC (s/p chemotherapy, followed by Dr. Marin Olp) with known liver metastasis and recently diagnosed brain metastasis While inpatient, radiation was being arranged. Pt had concerns of difficulty swallowing. Per Palliative Care, pt would not want PEG and wishes to remain full code, full scope of tx  Assessment and Plan: Status epilepticus ([place])  - Likely secondary to cerebral metastases from [place] -07/22/22 -> last focal seiure 07/20/22 eeg - ileptogenicity and cortical dysfunction arising from right temporoparietal region likely secondary to underlying mass. Additionally there was mild to moderate diffuse encephalopathy, nonspecific etiology. No seizures were seen during this study. Currently no seizures  -Continued on Keppra, Vimpat and Dilantin by neurology. - palliative care had been following and signed off as of 11/13 as patient would like to stay full code with full scope of care -Oncology following, recs to  continue radiation tx   Hyperglycemia -Due to steroids for vasogenic edema and uncontrolled type 2 DM  -CBG currently within goal. 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  Swallow evaluation was obtained, please see their note for full detail.  Most likely secondary to her recent brain lesions and radiation. -Dr. Reesa Chew discussed having PEG tube for nutritional support with patient and daughter -SLP following, recs for thin liquids, take small bites -Pt reports being better able to tolerate liquids and even bag of chips    Thrombocytopenia  HIT panel check on 10/6 was negative. -Plts remain stable        Consultants: Rad Onc, Oncology Procedures performed:   Disposition: Home Diet recommendation:  Regular diet DISCHARGE MEDICATION: Allergies as of 08/02/2022       Reactions   Rofecoxib Swelling        Medication List     STOP taking these medications    ciprofloxacin 500 MG tablet Commonly known as: Cipro   esomeprazole 20 MG capsule Commonly known as: NEXIUM   famotidine 40 MG tablet Commonly known as: PEPCID   fluconazole 100 MG tablet Commonly known as: DIFLUCAN   furosemide 40 MG tablet Commonly known as: Lasix   senna-docusate 8.6-50 MG tablet Commonly known as: Senokot-S       TAKE these  medications    acetaminophen 500 MG tablet Commonly known as: TYLENOL Take 1,000 mg by mouth every 6 (six) hours as needed for headache or moderate pain.   albuterol 108 (90 Base) MCG/ACT inhaler Commonly known as: VENTOLIN HFA Inhale 2 puffs into the lungs as needed for  wheezing or shortness of breath.   dexamethasone 4 MG tablet Commonly known as: DECADRON Take 1 tablet (4 mg total) by mouth every 6 (six) hours. What changed:  how much to take when to take this additional instructions   fentaNYL 12 MCG/HR Commonly known as: [place] 1 patch onto the skin every 3 (three) days.   ibuprofen 200 MG tablet Commonly known as: ADVIL Take 400 mg by mouth every 6 (six) hours as needed for headache (pain).   lacosamide 200 MG Tabs tablet Commonly known as: VIMPAT Take 1 tablet (200 mg total) by mouth 2 (two) times daily.   levETIRAcetam 750 MG tablet Commonly known as: KEPPRA Take 2 tablets (1,500 mg total) by mouth 2 (two) times daily.   levothyroxine 125 MCG tablet Commonly known as: SYNTHROID TAKE 1 TABLET(125 MCG) BY MOUTH DAILY BEFORE BREAKFAST What changed: See the new instructions.   lidocaine-prilocaine cream Commonly known as: EMLA Apply to affected area once What changed:  how much to take how to take this when to take this reasons to take this additional instructions   multivitamin tablet Take 1 tablet by mouth daily.   naloxone 4 MG/0.1ML Liqd nasal spray kit Commonly known as: NARCAN Place 0.4 mg into the nose as needed (overdose).   ondansetron 8 MG tablet Commonly known as: Zofran Take 1 tablet (8 mg total) by mouth every 8 (eight) hours as needed for nausea or vomiting. Start on the third day after chemotherapy. What changed:  when to take this additional instructions   Oxycodone HCl 10 MG Tabs Take 1 tablet (10 mg total) by mouth every 3 (three) hours as needed (breakthrough pain).   pantoprazole 40 MG tablet Commonly known as: PROTONIX Take 1 tablet (40 mg total) by mouth at bedtime.   phenytoin 125 MG/5ML suspension Commonly known as: DILANTIN Take 8 mLs (200 mg total) by mouth 2 (two) times daily.   potassium chloride SA 20 MEQ tablet Commonly known as: KLOR-CON M TAKE 2 TABLETS BY MOUTH TWICE  DAILY   prochlorperazine 10 MG tablet Commonly known as: COMPAZINE Take 1 tablet (10 mg total) by mouth every 6 (six) hours as needed for nausea or vomiting. What changed: when to take this   simvastatin 40 MG tablet Commonly known as: ZOCOR Take 40 mg by mouth daily.   sitaGLIPtin-metformin 50-500 MG tablet Commonly known as: JANUMET Take 1 tablet by mouth 2 (two) times daily with a meal.               Durable Medical Equipment  (From admission, onward)           Start     Ordered   07/27/22 1203  For home use only DME Walker rolling  Once       Question Answer Comment  Walker: With [place] Wheels   Patient needs a walker to treat with the following condition Generalized weakness      07/27/22 1202            Follow-up Information     Marinda Elk, MD Follow up in 2 week(s).   Specialty: Physician Assistant Why: Hospital follow up Contact information: [place]  [place] [address] [phone]         [name], MD Follow up.   Specialty: Oncology Contact information: [address] [place] [address] [phone]         Eppie Gibson, MD Follow up.   Specialty: Radiation Oncology Why: as scheduled Contact information: [address] [address] [phone]                Discharge Exam: Filed Weights   07/22/22 0500 07/25/22 0555 07/28/22 0443  Weight: 81.3 kg 82.6 kg 82.2 kg   General exam: Awake, laying in bed, in nad Respiratory system: Normal respiratory effort, no wheezing Cardiovascular system: regular rate, s1, s2 Gastrointestinal system: Soft, nondistended, positive BS Central nervous system: CN2-12 grossly intact, strength intact Extremities: Perfused, no clubbing Skin: Normal skin turgor, no notable skin lesions seen Psychiatry: Mood normal // no visual hallucinations   Condition at discharge: fair  The results of significant  diagnostics from this hospitalization (including imaging, microbiology, ancillary and laboratory) are listed below for reference.   Imaging Studies: MR Brain W Wo Contrast  Result Date: 07/29/2022 CLINICAL DATA:  Brain metastases.  Assess treatment response EXAM: MRI HEAD WITHOUT AND WITH CONTRAST TECHNIQUE: Multiplanar, multiecho pulse sequences of the brain and surrounding structures were obtained without and with intravenous contrast. CONTRAST:  25m GADAVIST GADOBUTROL 1 MMOL/ML IV SOLN COMPARISON:  07/09/2022 FINDINGS: Brain: There is no acute infarct or extra-axial collection. There is hemorrhage associated with masses in the right occipital and frontal lobes. Numerous contrast-enhancing lesions, nearly all of which are now larger: 1. Large left cerebellar lesion, 18 mm, previously 15 mm series 16, image 31 2. Inferior left cerebellum, 6 mm, larger, image 27 3. Right cerebellum, 4 mm, new, image 47 4. Lateral left cerebellum, 5 mm, larger, image 56 5. Anteromedial right temporal lobe, 5 mm, larger, image 57 6. Left temporal lobe, 6 mm, larger, image 64 7. Right temporal lobe, 6 mm, larger column image 66 8. Anterior right temporal lobe, 6 mm, larger, image 67 9. Posterior left temporal lobe, 10 mm, larger, image 69 10. Medial left temporal lobe, 20 mm, larger, image 70, 11. Right occipital lobe, 34 mm, larger, image 74. Marked surrounding edema. 12. Left temporal lobe, 7 mm, larger, image 78 13. Right insula, 8 mm, larger, image 90 14. Lateral right frontal lobe fall 26 mm, larger, image 106. Marked surrounding edema. 15. Posterior left frontal lobe, 13 mm, larger, image 119 16. Paramedian right frontal lobe, 7 mm, larger, image 122 17. Right frontal lobe, 6 mm, larger, image 128 18. Anterior paramedian right frontal lobe 6 mm, new, image 130 paramedian left frontal lobe, 5 mm, new, image 134 19. Superior paramedian right frontal lobe, 17 mm, larger, image 143 20. Superior left frontal lobe, 8 mm, larger,  image 146 21. Superior right frontal lobe, 8 mm, larger, image 147 There is mild mass effect in the right hemisphere without hydrocephalus or herniation. Vascular: Normal flow voids. Skull and upper cervical spine: Normal marrow signal. Sinuses/Orbits: Negative. Other: None. IMPRESSION: 1. Numerous contrast-enhancing lesions, nearly all of which are now larger, consistent with progression of metastatic disease. 2. Mild mass effect in the right hemisphere without hydrocephalus or herniation. Electronically Signed   By: KUlyses JarredM.D.   On: 07/29/2022 19:36   CT HEAD WO CONTRAST (5MM)  Result Date: 07/19/2022 CLINICAL DATA:  Initial evaluation for altered mental status. History of metastatic small  cell lung cancer. EXAM: CT HEAD WITHOUT CONTRAST TECHNIQUE: Contiguous axial images were obtained from the base of the skull through the vertex without intravenous contrast. RADIATION DOSE REDUCTION: This exam was performed according to the departmental dose-optimization program which includes automated exposure control, adjustment of the mA and/or kV according to patient size and/or use of iterative reconstruction technique. COMPARISON:  Prior head CT from 07/16/2022. FINDINGS: Brain: Multiple scattered metastatic lesions again seen. The dominant lesion at the right occipital convexity is increased in size, now measuring 3.3 x 2.6 x 2.3 cm, previously 2.7 x 2.4 x 1.8 cm when measured in a similar fashion. Additional right frontal parafalcine lesion also increased in size now measuring up to 1.5 cm, previously 1 cm. Associated hyperdensity consistent with hemorrhage/blood products. Multifocal areas of vasogenic edema again seen, worsened within the left parietal region, suggesting progressive disease (series 3, image 23). No midline shift. Basilar cisterns remain patent. No other acute intracranial hemorrhage or large vessel territory infarct. No hydrocephalus or extra-axial fluid collection. Vascular: No hyperdense  vessel. Skull: Scalp soft tissues and calvarium demonstrate no acute finding. Sinuses/Orbits: Globes orbital soft tissues demonstrate no acute finding. Air-fluid level with pneumatized secretions noted within the left maxillary sinus. No mastoid effusion. Other: None. IMPRESSION: 1. Interval increase in size of multiple scattered metastatic lesions as above, consistent with progressive disease. Scattered areas of vasogenic edema appear slightly progressed and worsened as well, most notable at the left parietal region. No midline shift. 2. No other acute intracranial abnormality. 3. Air-fluid level with pneumatized secretions within the left maxillary sinus, suggesting acute sinusitis. Electronically Signed   By: Jeannine Boga M.D.   On: 07/19/2022 23:18   Overnight EEG with video  Result Date: 07/16/2022 Lora Havens, MD     07/17/2022 10:27 AM Patient Name: MARGE VANDERMEULEN MRN: [id number] Epilepsy Attending: Lora Havens Referring Physician/Provider: Amie Portland, MD Duration: 07/16/2022 1829 to 07/17/2022 0523 Patient history: 61 year old female with non-small cell lung cancer with brain mets presented with seizures. EEG to eval for status epilepticus Level of alertness:  comatose AEDs during EEG study:  LEV, LCM, Propofol Technical aspects: This EEG study was done with scalp electrodes positioned according to the 10-20 International system of electrode placement. Electrical activity was reviewed with band pass filter of 1-_0 , sensitivity of 7 uV/mm, display speed of 67m/sec with a _1  notched filter applied as appropriate. EEG data were recorded continuously and digitally stored.  Video monitoring was available and reviewed as appropriate. Description: EEG showed continuous generalized 3 to 7 Hz theta and delta slowing lasting 2 to 4 seconds admixed with 15 to 18 Hz beta activity lasting 5 to 7 seconds. Hyperventilation and photic stimulation were not performed.   Event button was pressed on  07/16/2022 at 1521 during which patient was noted to have bilateral upper extremity twitching.  Concomitant EEG before, during and after the event did not show any EEG changes suggest seizure. Event button was pressed on 07/16/2022 at 2001 during which patient was noted to have right upper extremity twitching. Concomitant EEG before, during and after the event did not show any EEG changes suggest seizure. Event button was pressed on 07/16/2022 at 2209 during which patient was noted to have left upper extremity abduction followed by qasi-rhythmic jerking. Concomitant EEG before, during and after the event did not show any EEG changes suggest seizure. Event button was pressed on 06/28/2022 at 2205 during which patient's left upper extremity was flexed at the elbow and  had intermittent twitching. Concomitant EEG before, during and after the event did not show any EEG changes suggest seizure.  ABNORMALITY - Continuous slow, generalized - Excessive beta, generalized  IMPRESSION: This study is suggestive of severe diffuse encephalopathy, nonspecific etiology but likely related to sedation. No seizures or epileptiform discharges were seen throughout the recording. Multiple events were recorded as described above without concomitant EEG change.  However, focal motor seizures may not be seen on scalp EEG.  Clinical correlation is recommended.  Priyanka Barbra Sarks   Rapid EEG  Result Date: 07/16/2022 Lora Havens, MD     07/16/2022  9:16 AM Patient Name: MADISSEN WYSE MRN: [id number] Epilepsy Attending: Lora Havens Referring Physician/Provider: Nyra Jabs, DO Duration: 07/16/2022 4259 to 0406 Patient history: 61 year old female with non-small cell lung cancer with brain mets presented with seizures. Rapid EEG to eval for status epilepticus Level of alertness: comatose AEDs during EEG study: LEV, LCM, Propofol Technical aspects: This EEG was obtained using a 10 lead EEG system positioned circumferentially  without any parasagittal coverage (rapid EEG). Computer selected EEG is reviewed as  well as background features and all clinically significant events. Description: EEG showed continuous generalized 3 to 7 Hz theta and delta slowing lasting 2 to 4 seconds admixed with 15 to 18 Hz beta activity lasting 5 to 7 seconds. Hyperventilation and photic stimulation were not performed.   ABNORMALITY - Continuous slow, generalized - Excessive beta, generalized IMPRESSION: This rapid eeg is suggestive of severe diffuse encephalopathy, nonspecific etiology but likely related to sedation. No seizures or epileptiform discharges were seen throughout the recording. Lora Havens   DG Abd Portable 1V  Result Date: 07/16/2022 CLINICAL DATA:  Orogastric tube placement EXAM: PORTABLE ABDOMEN - 1 VIEW COMPARISON:  Portable exam 0714 hours compared to 07/16/2022 at 0112 hours FINDINGS: Tip of nasogastric tube projects over mid stomach. Scattered surgical clips RIGHT upper quadrant. Paucity of bowel gas. Elevation of RIGHT diaphragm. Osseous demineralization. IMPRESSION: Tip of nasogastric tube projects over mid stomach. Electronically Signed   By: Lavonia Dana M.D.   On: 07/16/2022 08:29   DG Chest Port 1 View  Result Date: 07/16/2022 CLINICAL DATA:  Intubation EXAM: PORTABLE CHEST 1 VIEW COMPARISON:  Portable exam 0711 hours compared to 0110 hours FINDINGS: Tip of endotracheal tube projects 1.8 cm above carina. Nasogastric tube extends into stomach. RIGHT jugular Port-A-Cath tip projects over mid RIGHT atrium. Normal heart size, mediastinal contours, and pulmonary vascularity. Atherosclerotic calcification aorta. Chronic elevation of RIGHT diaphragm with RIGHT basilar atelectasis. Slight accentuation of LEFT perihilar markings without definite infiltrate, pleural effusion, or pneumothorax. Bones demineralized. IMPRESSION: Line and tube positions as above. RIGHT basilar atelectasis. Aortic Atherosclerosis (ICD10-I70.0).  Electronically Signed   By: Lavonia Dana M.D.   On: 07/16/2022 08:28   CT Cervical Spine Wo Contrast  Result Date: 07/16/2022 CLINICAL DATA:  Neck trauma, mechanically unstable spine (Age >= 16y). Seizure EXAM: CT CERVICAL SPINE WITHOUT CONTRAST TECHNIQUE: Multidetector CT imaging of the cervical spine was performed without intravenous contrast. Multiplanar CT image reconstructions were also generated. RADIATION DOSE REDUCTION: This exam was performed according to the departmental dose-optimization program which includes automated exposure control, adjustment of the mA and/or kV according to patient size and/or use of iterative reconstruction technique. COMPARISON:  None Available. FINDINGS: Alignment: Normal Skull base and vertebrae: No acute fracture. No primary bone lesion or focal pathologic process. Soft tissues and spinal canal: No prevertebral fluid or swelling. No visible canal hematoma. Disc levels:  Mild degenerative disc disease at C5-6 and C6-7. Upper chest: No acute findings Other: None IMPRESSION: No acute bony abnormality. Electronically Signed   By: Rolm Baptise M.D.   On: 07/16/2022 01:49   CT HEAD WO CONTRAST (5MM)  Result Date: 07/16/2022 CLINICAL DATA:  Seizure, new-onset, no history of trauma. History of small cell lung cancer. EXAM: CT HEAD WITHOUT CONTRAST TECHNIQUE: Contiguous axial images were obtained from the base of the skull through the vertex without intravenous contrast. RADIATION DOSE REDUCTION: This exam was performed according to the departmental dose-optimization program which includes automated exposure control, adjustment of the mA and/or kV according to patient size and/or use of iterative reconstruction technique. COMPARISON:  MRI 07/09/2022 FINDINGS: Brain: Multiple metastases again noted as seen on prior MRI. Hyperdense mass within the right occipital lobe measuring up to 2.6 cm, stable since recent MRI. Hyperdense lesion in the medial right frontal lobe measures 10  mm, stable. The numerous other metastases seen on prior MRI not as well visualized. Vasogenic edema seen within the right frontal and posterior parietal/occipital lobes. No hydrocephalus or midline shift. Vascular: No hyperdense vessel or unexpected calcification. Skull: No acute calvarial abnormality. Sinuses/Orbits: No acute findings Other: None IMPRESSION: Multiple hyperdense/hemorrhagic metastases, not as well visualized as on prior/recent MRI. The largest lesion in the posterior right occipital lobe 2.6 cm, unchanged. Areas of edema within the right frontal and parietal/occipital lobes. Electronically Signed   By: Rolm Baptise M.D.   On: 07/16/2022 01:48   DG Abdomen 1 View  Result Date: 07/16/2022 CLINICAL DATA:  OG tube placement EXAM: ABDOMEN - 1 VIEW COMPARISON:  CT 07/09/2022 FINDINGS: OG tube tip is in the mid stomach. Nonobstructive bowel gas pattern. Prior CABG. IMPRESSION: OG tube in the mid stomach. Electronically Signed   By: Rolm Baptise M.D.   On: 07/16/2022 01:43   DG Chest Portable 1 View  Result Date: 07/16/2022 CLINICAL DATA:  Intubation EXAM: PORTABLE CHEST 1 VIEW COMPARISON:  11/21/2021 FINDINGS: Right Port-A-Cath remains in place, unchanged. Endotracheal tube is 2 cm above the carina. NG tube is in the stomach. Mild elevation of the right hemidiaphragm. Heart and mediastinal contours are within normal limits. No focal opacities or effusions. No acute bony abnormality. Aortic atherosclerosis. IMPRESSION: Support devices as above. No active cardiopulmonary disease. Electronically Signed   By: Rolm Baptise M.D.   On: 07/16/2022 01:43   CT CHEST ABDOMEN PELVIS W CONTRAST  Result Date: 07/10/2022 CLINICAL DATA:  Small-cell lung cancer. Restaging. * Tracking Code: BO * EXAM: CT CHEST, ABDOMEN, AND PELVIS WITH CONTRAST TECHNIQUE: Multidetector CT imaging of the chest, abdomen and pelvis was performed following the standard protocol during bolus administration of intravenous contrast.  RADIATION DOSE REDUCTION: This exam was performed according to the departmental dose-optimization program which includes automated exposure control, adjustment of the mA and/or kV according to patient size and/or use of iterative reconstruction technique. CONTRAST:  124m OMNIPAQUE IOHEXOL 300 MG/ML  SOLN COMPARISON:  Chest CT 05/01/2022. Chest abdomen pelvis CT 04/04/2022. FINDINGS: CT CHEST FINDINGS Cardiovascular: The heart size is normal. No substantial pericardial effusion. Mediastinum/Nodes: Right Port-A-Cath tip is in the right atrium. Distal paraesophageal node measures 1.6 cm short axis today on 43/2, increased from 0.8 cm previously. A second low paraesophageal node measuring 1.2 cm short axis on 41/2 has increased from 3 mm previously. Additional lower esophageal lymphadenopathy evident. 7 mm prevascular node on 18/2 was 3 mm previously. There is no hilar lymphadenopathy. The esophagus has normal imaging features. There  is no axillary lymphadenopathy. Lungs/Pleura: Atelectasis noted right lung base. 11 x 8 mm peripheral left lower lobe nodule measured previously at has increased measuring 1.5 x 1.4 cm today on 56/4. Adjacent new perifissural nodules are seen with satellite nodules tracking centrally towards the posterior right hilum. 10 mm left lower lobe subpleural nodule adjacent to the aorta (66/4) is new in the interval. Branching nodularity in the peripheral left lower lobe on 51/4 may relate to tumor invasion of an airway or atypical infection. 1.5 cm subpleural nodule left lower lobe on 85/4 was 0.6 cm previously. 2.4 cm subpleural left lower lobe nodule in the costophrenic sulcus (114/4) was 1.3 cm previously. No pleural effusion. Musculoskeletal: No worrisome lytic or sclerotic osseous abnormality. CT ABDOMEN PELVIS FINDINGS Hepatobiliary: Heterogeneous liver perfusion again noted with multiple hepatic metastases in both hepatic lobes. 10 mm lesion in the posterior hepatic dome measured  previously is similar on image 34/2 today. 2.0 cm right hepatic lesion on 43/2 today was not definitely visible on the prior study. 1.9 cm lateral segment left hepatic lobe lesion on 56/2 also appears new in the interval. Gallbladder surgically absent. No intrahepatic or extrahepatic biliary dilation. Pancreas: No focal mass lesion. No dilatation of the main duct. No intraparenchymal cyst. No peripancreatic edema. Spleen: No splenomegaly. No focal mass lesion. Adrenals/Urinary Tract: No adrenal nodule or mass. Left kidney unremarkable. Stable small cyst lower pole right kidney. No followup imaging is recommended. No evidence for hydroureter. Bladder decompressed. Stomach/Bowel: Stomach is unremarkable. No gastric wall thickening. No evidence of outlet obstruction. Duodenum is normally positioned as is the ligament of Treitz. No small bowel wall thickening. No small bowel dilatation. Enterocolic anastomosis identified right abdomen. No gross colonic mass. No colonic wall thickening. Vascular/Lymphatic: There is moderate atherosclerotic calcification of the abdominal aorta without aneurysm. 1.7 cm short axis left para-aortic node on 59/2 was 0.6 cm previously. 1.1 cm aortocaval node on 61/2 is progressive. No pelvic sidewall lymphadenopathy. Reproductive: Uterus surgically absent.  There is no adnexal mass. Other: No intraperitoneal free fluid. Musculoskeletal: No worrisome lytic or sclerotic osseous abnormality. IMPRESSION: 1. Interval progression of multiple pulmonary nodules in the left lung consistent with disease progression. 2. Multiple new enlarged lymph nodes are seen in the lower paraesophageal region and retroperitoneal space of the abdomen, consistent with metastatic disease. 3. Interval development of multiple new hepatic metastases. Previously identified hepatic metastases are less conspicuous today suggesting that there has been a mixed interval response to therapy. However, generalized trend in the  liver is towards progressive disease as more hepatic metastases are visible on today's study than on the prior exam. 4. Branching nodularity in the peripheral left lower lobe may relate to tumor invasion of an airway or atypical infection. 5. Aortic Atherosclerosis (ICD10-I70.0). Electronically Signed   By: Misty Stanley M.D.   On: 07/10/2022 15:26   MR Brain W Wo Contrast  Result Date: 07/09/2022 CLINICAL DATA:  Provided history: Small cell lung cancer. Small cell lung cancer, assess treatment response; visual changes. EXAM: MRI HEAD WITHOUT AND WITH CONTRAST TECHNIQUE: Multiplanar, multiecho pulse sequences of the brain and surrounding structures were obtained without and with intravenous contrast. CONTRAST:  7.8m GADAVIST GADOBUTROL 1 MMOL/ML IV SOLN COMPARISON:  Prior brain MRI examinations 04/04/2022 and earlier. FINDINGS: Brain: No age advanced or lobar predominant parenchymal atrophy. Multiple enhancing intracranial lesions compatible with metastases, new from the prior brain MRI of 04/04/2022 and as follows. 13 mm lesion along the medial aspect of the mid right frontal  lobe, abutting the falx (series 18, image 125). Mild parenchymal edema associated with this lesion. Adjacent 6 mm lesion along the mid right frontal lobe (series 18, image 126). 4 mm lesion along the mid right frontal lobe (series 18, image 109). 4 mm lesion along the anteromedial right frontal lobe, abutting the falx (series 18, image 108). 17 x 12 mm lesion along the right frontal operculum, abutting the overlying dura (series 18, image 91). Mild-to-moderate edema associated with this lesion. 7 mm lesion along the right insula (series 18, image 80). 3 mm lesion along the anterior right temporal lobe (series 18, image 51). 26 x 19 mm lesion within the right occipital lobe, abutting the overlying dura (series 18, image 64). Moderate surrounding vasogenic edema. 4 mm enhancing lesion along the high mid left frontal lobe (series 18, image  129). 4 mm lesion along the anteromedial left frontal lobe, abutting the falx (series 18, image 113). 6 mm lesion along the posterior left frontal lobe (series 18, image 103). 9 mm lesion along the left insula (series 18, image 84). 20 x 16 mm lesion along the medial left temporal lobe, abutting the tentorium (series 18, image 62). Mild edema associated with this lesion. 2 mm lesion along the lateral left temporal lobe (series 18, image 64). 7 mm lesion along the left temporo-occipital lobes (series 18, image 59). 3 mm lesion along the posterolateral left temporal lobe (series 18, image 53). 12 mm lesion along the right superomedial aspect of the cerebellum, abutting the adjacent tentorium (series 18, image 57). 14 mm lesion within the inferior left cerebellar hemisphere (series 18, image 21). 6 mm lesion along the inferior left cerebellar hemisphere (series 18, image 14). Non-acute blood products associated with several of the above described lesions. Chronic microhemorrhage within the left cerebellar hemisphere. Background mild patchy and ill-defined hypoattenuation within the cerebral white matter, nonspecific but compatible with chronic small vessel image disease. There is no acute infarct. No extra-axial fluid collection. No midline shift. Vascular: Maintained flow voids within the proximal large arterial vessels. Skull and upper cervical spine: No focal suspicious marrow lesion. Sinuses/Orbits: No mass or acute finding within the imaged orbits. Small-to-moderate volume frothy secretions, and background mild mucosal thickening, within the left maxillary sinus. IMPRESSION: Numerous intracranial metastases, as described and new from the prior brain MRI of 04/04/2022. The largest lesions are located along the right frontal operculum (17 x 12 mm), within the right occipital lobe (26 x 19 mm) and along the medial left temporal lobe (20 x 16 mm). Edema associated with several lesions. Most notably, there is  mild-to-moderate edema associated with the lesion along the right frontal operculum and moderate edema surrounding the lesion within the right occipital lobe. Additionally, there are non-acute blood products associated with several lesions. 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 Consider a total spine MRI with contrast for further evaluation. Mild chronic small vessel ischemic changes within the cerebral white matter. Left maxillary sinusitis. Electronically Signed   By: Kellie Simmering D.O.   On: 07/09/2022 14:55    Microbiology: Results for orders placed or performed during the hospital encounter of 07/16/22  Surgical PCR screen     Status: None   Collection Time: 07/16/22  5:57 AM   Specimen: Nasal Mucosa; Nasal Swab  Result Value Ref Range Status   MRSA, PCR NEGATIVE NEGATIVE Final   Staphylococcus  aureus NEGATIVE NEGATIVE Final    Comment: (NOTE) The Xpert SA Assay (FDA approved for NASAL specimens in patients 62 years of age and older), is one component of a comprehensive surveillance program. It is not intended to diagnose infection nor to guide or monitor treatment. Performed at [facility], [place] [address] [place], [place] [address]     Labs: CBC: Recent Labs  Lab 07/28/22 0528 07/29/22 0452 07/30/22 0455 08/01/22 0504 08/02/22 0508  WBC 8.5 8.1 7.3 6.2 5.7  NEUTROABS 7.0 6.5 5.8 4.8 4.2  HGB 9.3* 9.4* 9.3* 8.9* 9.4*  HCT 28.8* 29.5* 29.2* 27.4* 29.4*  MCV 101.1* 102.1* 101.4* 100.7* 101.4*  PLT 100* 113* 105* 106* 165*   Basic Metabolic Panel: Recent Labs  Lab 07/28/22 0528 07/29/22 0452 07/30/22 0455 08/01/22 0504 08/02/22 0508  NA 135 134* 133* 135 136  K 4.0 4.4 4.4 4.1 4.2  CL 98 99 98 99 100  CO2 _0 GLUCOSE 154* 130* 135* 111* 104*  BUN _1 CREATININE  0.62 0.69 0.76 0.78 0.64  CALCIUM 9.1 9.2 9.3 9.1 9.4   Liver Function Tests: Recent Labs  Lab 07/28/22 0528 07/29/22 0452 07/30/22 0455 08/01/22 0504 08/02/22 0508  AST 45* 39 42* 51* 59*  ALT 49* 44 41 42 49*  ALKPHOS 220* 208* 236* 245* 258*  BILITOT 1.2 1.0 0.9 1.0 0.7  PROT 7.1 7.7 7.4 6.9 7.2  ALBUMIN 2.9* 3.2* 3.1* 3.0* 3.0*   CBG: Recent Labs  Lab 08/01/22 1140 08/01/22 1657 08/01/22 2105 08/02/22 0845 08/02/22 1113  GLUCAP 155* 105* 110* 158* 133*    Discharge time spent: less than 30 minutes.  Signed: Marylu Lund, MD [facility] 08/02/2022

## 2022-08-02 NOTE — Progress Notes (Signed)
Pamela [name] is little bit quiet this morning.  Is hard to say what might be going on.  She says that she does not hurt.  She has had no seizures.  She still is having some slurred speech.  Unsure how well she is swallowing.  Not much, she really is eating.  I know that physical therapy is working with her.  Speech pathology is working with her with respect to swallowing.  They seem to be happy with her progress.  I am unsure how much more radiation she has left.  She says that she will finish radiation today.  I am unsure that this is really the case.  Her labs show white count of 5.7.  Hemoglobin 9.4.  Platelet count is 124,000.  Her SGPT is 49 SGOT 59.  Alkaline phosphatase is 258.  LDH is 317.  BUN is 14 creatinine 0.64.  Blood sugar is 104.  She says she is not hurting.  There is no bowel or bladder incontinence.  She has had no bleeding.  Her vital signs are temperature 97.9.  Pulse 80.  Blood pressure 142/74.  Her lungs are clear bilaterally.  She has good air movement bilaterally.  Cardiac exam regular rate and rhythm.  She has no murmurs, rubs or bruits.  Abdomen is soft.  She has decent bowel sounds.  There is no fluid wave.  There is no palpable liver or spleen tip.  Neurological exam seems to show some weakness over on the right side of her face.  Maybe a little bit of weakness on the left side of the body.  Ms. Gregg will continue her radiation therapy.  Neurologically, I really have not seen too much improvement.  We increased her steroids to help with any possible edema.  Again, I am not sure when she will finish up her radiation therapy.  I do appreciate everybody's help with her.  I know this is quite complicated.  [name], MD  Proverbs 17:17

## 2022-08-02 NOTE — Progress Notes (Signed)
Mobility Specialist - Progress Note   08/02/22 0900  Mobility  Activity Ambulated with assistance in hallway  Level of Assistance Contact guard assist, steadying assist  Assistive Device Front wheel walker  Distance Ambulated (ft) 480 ft  Range of Motion/Exercises Active  Activity Response Tolerated well  Mobility Referral Yes  $Mobility charge 1 Mobility   Pt was found on recliner chair and agreeable to ambulate. Had no complaints but still requiring cues to avoid hitting objects/wall while ambulating. At EOS returned to recliner chair with necessities in reach and daughter in room.  Ferd Hibbs Mobility Specialist

## 2022-08-02 NOTE — Progress Notes (Signed)
Physical Therapy Treatment Patient Details Name: Pamela [name] MRN: [id number] DOB: 1961-05-24 Today's Date: 08/02/2022   History of Present Illness 61 y.o. female presents to [facility] hospital on 07/16/2022 after being found down at home with seizure-like activity, hyperglycemic to 300s. Pt required intubation on arrival to ED. CT Head with multiple hyperdense/hemorrhagic metastases (some known already). Extubated 11/2, further seizure-like activity 11/3. PMH includes HTN, DMII, hypothyroidism, SCLC with liver and brain mets.    PT Comments    General Comments: AxO x 2 very sweet Lady following repeat instructions at times distracted. Assisted with amb in hallway.  General Gait Details: tolerated a functional distance self advancing walker but present with LEFT drift.  Family reports low vision plus NO LEFT peripheral vision so required VC's/dierction to avoid running into walls/furniture/etc on pt's LEFT.  Pt was able to self direct with cueing and was aware if she hit s something.  Pt plans to return with 24/7 family support.  Recommendations for follow up therapy are one component of a multi-disciplinary discharge planning process, led by the attending physician.  Recommendations may be updated based on patient status, additional functional criteria and insurance authorization.  Follow Up Recommendations  Home health PT     Assistance Recommended at Discharge Frequent or constant Supervision/Assistance  Patient can return home with the following A little help with walking and/or transfers;A little help with bathing/dressing/bathroom;Assistance with cooking/housework;Direct supervision/assist for medications management;Assist for transportation;Help with stairs or ramp for entrance   Equipment Recommendations  Rolling walker (2 wheels)    Recommendations for Other Services       Precautions / Restrictions Precautions Precautions: Fall Precaution Comments: seizures, low vision with no LEFT  peripheral vision 2nd Brain METS Restrictions Weight Bearing Restrictions: No     Mobility  Bed Mobility               General bed mobility comments: OOB in recliner    Transfers Overall transfer level: Needs assistance Equipment used: None, Rolling walker (2 wheels) Transfers: Sit to/from Stand Sit to Stand: Supervision           General transfer comment: supervision for safety. no assist or cues for technique needed.    Ambulation/Gait Ambulation/Gait assistance: Min guard Gait Distance (Feet): 225 Feet Assistive device: Rolling walker (2 wheels) Gait Pattern/deviations: Step-through pattern, Decreased stride length Gait velocity: decreased     General Gait Details: tolerated a functional distance self advancing walker but present with LEFT drift.  Family reports low vision plus NO LEFT peripheral vision so required VC's/dierction to avoid running into walls/furniture/etc on pt's LEFT.  Pt was able to self direct with cueing and was aware if she hit s something.   Stairs             Wheelchair Mobility    Modified Rankin (Stroke Patients Only)       Balance                                            Cognition Arousal/Alertness: Awake/alert Behavior During Therapy: WFL for tasks assessed/performed Overall Cognitive Status: Within Functional Limits for tasks assessed                                 General Comments: AxO x 2 very sweet Lady following repeat instructions  at times distracted.        Exercises      General Comments        Pertinent Vitals/Pain Pain Assessment Pain Assessment: No/denies pain    Home Living                          Prior Function            PT Goals (current goals can now be found in the care plan section) Progress towards PT goals: Progressing toward goals    Frequency    Min 3X/week      PT Plan Current plan remains appropriate    Co-evaluation               AM-PAC PT "6 Clicks" Mobility   Outcome Measure  Help needed turning from your back to your side while in a flat bed without using bedrails?: A Little Help needed moving from lying on your back to sitting on the side of a flat bed without using bedrails?: A Little Help needed moving to and from a bed to a chair (including a wheelchair)?: A Little Help needed standing up from a chair using your arms (e.g., wheelchair or bedside chair)?: A Little Help needed to walk in hospital room?: A Little Help needed climbing 3-5 steps with a railing? : A Little 6 Click Score: 18    End of Session Equipment Utilized During Treatment: Gait belt Activity Tolerance: Patient tolerated treatment well Patient left: in chair;with call bell/phone within reach;with chair alarm set Nurse Communication: Mobility status PT Visit Diagnosis: Muscle weakness (generalized) (M62.81);Difficulty in walking, not elsewhere classified (R26.2)     Time: 6381-7711 PT Time Calculation (min) (ACUTE ONLY): 14 min  Charges:  $Gait Training: 8-22 mins                     Rica Koyanagi  PTA [place] [facility] M-F          [phone] Weekend pager [phone]

## 2022-08-05 ENCOUNTER — Encounter: Payer: Self-pay | Admitting: *Deleted

## 2022-08-05 ENCOUNTER — Ambulatory Visit: Payer: BC Managed Care – PPO

## 2022-08-05 NOTE — Progress Notes (Unsigned)
Received a call from patient's daughter, Pamela [name]. She has multiple questions regarding her mother's discharge and follow up plan including home health, medication administration, appointments, pain management, and future therapies. Answered these questions to her satisfaction.   Oncology Nurse Navigator Documentation     08/05/2022   10:45 AM  Oncology Nurse Navigator Flowsheets  Navigator Location [employer]  Navigator Encounter Type Telephone  Telephone Education;Incoming Call;Patient Update;Symptom Mgt  Patient Visit Type MedOnc  Treatment Phase Active Tx  Barriers/Navigation Needs Coordination of Care;Education  Education Pain/ Symptom Management;Other  Interventions Coordination of Care;Education;Psycho-Social Support  Acuity Level 2-Minimal Needs (1-2 Barriers Identified)  Coordination of Care Appts  Education Method Verbal  Support Groups/Services Friends and Family  Time Spent with Patient 64

## 2022-08-05 NOTE — Progress Notes (Signed)
Patient has been discharged from the hospital after finishing her radiation. Spoke to Dr Marin Olp. He would like patient to come in next week for follow up and discussion of future treatment. Message sent to scheduling, and to patient via [facility].  Oncology Nurse Navigator Documentation     08/05/2022    8:00 AM  Oncology Nurse Navigator Flowsheets  Phase of Treatment Radiation  Radiation Actual Start Date: 07/23/2022  Radiation Actual End Date: 08/02/2022  Navigator Location [facility]  Navigator Encounter Type MyChart;Appt/Treatment Plan Review  Patient Visit Type MedOnc  Treatment Phase Active Tx  Barriers/Navigation Needs Coordination of Care;Education  Interventions None Required  Acuity Level 2-Minimal Needs (1-2 Barriers Identified)  Support Groups/Services Friends and Family  Time Spent with Patient 15

## 2022-08-06 ENCOUNTER — Encounter: Payer: Self-pay | Admitting: *Deleted

## 2022-08-06 ENCOUNTER — Telehealth: Payer: Self-pay | Admitting: *Deleted

## 2022-08-06 ENCOUNTER — Ambulatory Visit: Payer: BC Managed Care – PPO

## 2022-08-06 ENCOUNTER — Other Ambulatory Visit: Payer: Self-pay | Admitting: *Deleted

## 2022-08-06 ENCOUNTER — Encounter: Payer: Self-pay | Admitting: Hematology & Oncology

## 2022-08-06 MED ORDER — TRAMADOL HCL 50 MG PO TABS: 50.0000 mg | ORAL_TABLET | Freq: Four times a day (QID) | ORAL | 0 refills | Status: DC | PRN

## 2022-08-06 NOTE — Progress Notes (Signed)
Received message from patient's daughter, Candice. Patient is still experiencing some pain and finds that her tramadol is most effective for treatment. Her tramadol is an older prescription and not one from this office. She asks if Dr Marin Olp will prescribe it as she is almost out.  Spoke with Dr Marin Olp is he is good with providing a prescription for Tramadol. Candice notified.   Oncology Nurse Navigator Documentation     08/06/2022    8:15 AM  Oncology Nurse Navigator Flowsheets  Navigator Follow Up Date: 08/14/2022  Navigator Follow Up Reason: Follow-up Appointment  Navigator Location [facility]  Navigator Encounter Type Letter/Fax/Email  Patient Visit Type MedOnc  Treatment Phase Active Tx  Barriers/Navigation Needs Coordination of Care;Education  Education Other  Interventions Medication Assistance  Acuity Level 2-Minimal Needs (1-2 Barriers Identified)  Education Method Written  Support Groups/Services Friends and Family  Time Spent with Patient 15

## 2022-08-06 NOTE — Telephone Encounter (Signed)
Per scheduling message - called and lvm of upcoming appointments - requested callback to confirm

## 2022-08-07 ENCOUNTER — Ambulatory Visit: Payer: BC Managed Care – PPO

## 2022-08-11 ENCOUNTER — Encounter: Payer: Self-pay | Admitting: *Deleted

## 2022-08-12 ENCOUNTER — Ambulatory Visit: Payer: BC Managed Care – PPO

## 2022-08-12 ENCOUNTER — Other Ambulatory Visit: Payer: Self-pay | Admitting: *Deleted

## 2022-08-12 MED ORDER — PHENYTOIN 125 MG/5ML PO SUSP: 200.0000 mg | Freq: Two times a day (BID) | ORAL | 0 refills | Status: AC

## 2022-08-14 ENCOUNTER — Encounter (HOSPITAL_COMMUNITY): Payer: Self-pay

## 2022-08-14 ENCOUNTER — Inpatient Hospital Stay: Payer: BC Managed Care – PPO

## 2022-08-14 ENCOUNTER — Inpatient Hospital Stay (HOSPITAL_COMMUNITY)
Admission: EM | Admit: 2022-08-14 | Discharge: 2022-08-17 | DRG: 064 | Disposition: A | Discharge status: Hospice | Payer: BC Managed Care – PPO | Attending: Internal Medicine | Admitting: Internal Medicine

## 2022-08-14 ENCOUNTER — Emergency Department (HOSPITAL_COMMUNITY): Payer: BC Managed Care – PPO

## 2022-08-14 ENCOUNTER — Encounter: Payer: Self-pay | Admitting: *Deleted

## 2022-08-14 ENCOUNTER — Inpatient Hospital Stay: Payer: BC Managed Care – PPO | Admitting: Hematology & Oncology

## 2022-08-14 DIAGNOSIS — Z66 Do not resuscitate: Secondary | ICD-10-CM | POA: Diagnosis not present

## 2022-08-14 DIAGNOSIS — I69354 Hemiplegia and hemiparesis following cerebral infarction affecting left non-dominant side: Secondary | ICD-10-CM | POA: Diagnosis not present

## 2022-08-14 DIAGNOSIS — I619 Nontraumatic intracerebral hemorrhage, unspecified: Principal | ICD-10-CM | POA: Diagnosis present

## 2022-08-14 DIAGNOSIS — Z91128 Patient's intentional underdosing of medication regimen for other reason: Secondary | ICD-10-CM

## 2022-08-14 DIAGNOSIS — R0902 Hypoxemia: Secondary | ICD-10-CM | POA: Diagnosis present

## 2022-08-14 DIAGNOSIS — Z1152 Encounter for screening for COVID-19: Secondary | ICD-10-CM

## 2022-08-14 DIAGNOSIS — Z515 Encounter for palliative care: Secondary | ICD-10-CM

## 2022-08-14 DIAGNOSIS — R0603 Acute respiratory distress: Secondary | ICD-10-CM

## 2022-08-14 DIAGNOSIS — C7931 Secondary malignant neoplasm of brain: Secondary | ICD-10-CM | POA: Diagnosis present

## 2022-08-14 DIAGNOSIS — D649 Anemia, unspecified: Secondary | ICD-10-CM | POA: Diagnosis present

## 2022-08-14 DIAGNOSIS — E039 Hypothyroidism, unspecified: Secondary | ICD-10-CM | POA: Diagnosis present

## 2022-08-14 DIAGNOSIS — C3490 Malignant neoplasm of unspecified part of unspecified bronchus or lung: Secondary | ICD-10-CM | POA: Diagnosis not present

## 2022-08-14 DIAGNOSIS — N390 Urinary tract infection, site not specified: Secondary | ICD-10-CM | POA: Diagnosis present

## 2022-08-14 DIAGNOSIS — C799 Secondary malignant neoplasm of unspecified site: Secondary | ICD-10-CM | POA: Diagnosis not present

## 2022-08-14 DIAGNOSIS — J189 Pneumonia, unspecified organism: Secondary | ICD-10-CM

## 2022-08-14 DIAGNOSIS — C787 Secondary malignant neoplasm of liver and intrahepatic bile duct: Secondary | ICD-10-CM | POA: Diagnosis present

## 2022-08-14 DIAGNOSIS — Z7189 Other specified counseling: Secondary | ICD-10-CM | POA: Diagnosis not present

## 2022-08-14 DIAGNOSIS — Z803 Family history of malignant neoplasm of breast: Secondary | ICD-10-CM

## 2022-08-14 DIAGNOSIS — E1165 Type 2 diabetes mellitus with hyperglycemia: Secondary | ICD-10-CM | POA: Diagnosis present

## 2022-08-14 DIAGNOSIS — Z888 Allergy status to other drugs, medicaments and biological substances status: Secondary | ICD-10-CM

## 2022-08-14 DIAGNOSIS — M549 Dorsalgia, unspecified: Secondary | ICD-10-CM | POA: Diagnosis present

## 2022-08-14 DIAGNOSIS — I10 Essential (primary) hypertension: Secondary | ICD-10-CM | POA: Diagnosis present

## 2022-08-14 DIAGNOSIS — E7220 Disorder of urea cycle metabolism, unspecified: Secondary | ICD-10-CM | POA: Diagnosis present

## 2022-08-14 DIAGNOSIS — E119 Type 2 diabetes mellitus without complications: Secondary | ICD-10-CM

## 2022-08-14 DIAGNOSIS — E871 Hypo-osmolality and hyponatremia: Secondary | ICD-10-CM | POA: Diagnosis present

## 2022-08-14 DIAGNOSIS — G40909 Epilepsy, unspecified, not intractable, without status epilepticus: Secondary | ICD-10-CM | POA: Diagnosis present

## 2022-08-14 DIAGNOSIS — C782 Secondary malignant neoplasm of pleura: Secondary | ICD-10-CM | POA: Diagnosis present

## 2022-08-14 DIAGNOSIS — Z8542 Personal history of malignant neoplasm of other parts of uterus: Secondary | ICD-10-CM

## 2022-08-14 DIAGNOSIS — T380X6A Underdosing of glucocorticoids and synthetic analogues, initial encounter: Secondary | ICD-10-CM | POA: Diagnosis present

## 2022-08-14 DIAGNOSIS — Z90710 Acquired absence of both cervix and uterus: Secondary | ICD-10-CM

## 2022-08-14 DIAGNOSIS — C729 Malignant neoplasm of central nervous system, unspecified: Secondary | ICD-10-CM | POA: Diagnosis not present

## 2022-08-14 DIAGNOSIS — D696 Thrombocytopenia, unspecified: Secondary | ICD-10-CM | POA: Diagnosis present

## 2022-08-14 DIAGNOSIS — I69391 Dysphagia following cerebral infarction: Secondary | ICD-10-CM

## 2022-08-14 DIAGNOSIS — Z7984 Long term (current) use of oral hypoglycemic drugs: Secondary | ICD-10-CM

## 2022-08-14 DIAGNOSIS — R8290 Unspecified abnormal findings in urine: Secondary | ICD-10-CM | POA: Diagnosis present

## 2022-08-14 DIAGNOSIS — Z79899 Other long term (current) drug therapy: Secondary | ICD-10-CM

## 2022-08-14 DIAGNOSIS — Z87891 Personal history of nicotine dependence: Secondary | ICD-10-CM

## 2022-08-14 DIAGNOSIS — Z79890 Hormone replacement therapy (postmenopausal): Secondary | ICD-10-CM

## 2022-08-14 DIAGNOSIS — Z7952 Long term (current) use of systemic steroids: Secondary | ICD-10-CM

## 2022-08-14 DIAGNOSIS — C3432 Malignant neoplasm of lower lobe, left bronchus or lung: Secondary | ICD-10-CM | POA: Diagnosis present

## 2022-08-14 DIAGNOSIS — Z9049 Acquired absence of other specified parts of digestive tract: Secondary | ICD-10-CM

## 2022-08-14 DIAGNOSIS — Z85038 Personal history of other malignant neoplasm of large intestine: Secondary | ICD-10-CM

## 2022-08-14 DIAGNOSIS — R748 Abnormal levels of other serum enzymes: Secondary | ICD-10-CM | POA: Diagnosis present

## 2022-08-14 LAB — COMPREHENSIVE METABOLIC PANEL
ALT: 82 U/L — ABNORMAL HIGH (ref 0–44)
AST: 111 U/L — ABNORMAL HIGH (ref 15–41)
Albumin: 2.5 g/dL — ABNORMAL LOW (ref 3.5–5.0)
Alkaline Phosphatase: 333 U/L — ABNORMAL HIGH (ref 38–126)
Anion gap: 11 (ref 5–15)
BUN: 20 mg/dL (ref 8–23)
CO2: 24 mmol/L (ref 22–32)
Calcium: 8.3 mg/dL — ABNORMAL LOW (ref 8.9–10.3)
Chloride: 96 mmol/L — ABNORMAL LOW (ref 98–111)
Creatinine, Ser: 0.69 mg/dL (ref 0.44–1.00)
GFR, Estimated: >60 mL/min (ref >60)
Glucose, Bld: 153 mg/dL — ABNORMAL HIGH (ref 70–99)
Potassium: 4.5 mmol/L (ref 3.5–5.1)
Sodium: 131 mmol/L — ABNORMAL LOW (ref 135–145)
Total Bilirubin: 2.2 mg/dL — ABNORMAL HIGH (ref 0.3–1.2)
Total Protein: 6.9 g/dL (ref 6.5–8.1)

## 2022-08-14 LAB — CBC WITH DIFFERENTIAL/PLATELET
Abs Immature Granulocytes: 0.46 10*3/uL — ABNORMAL HIGH (ref 0.00–0.07)
Basophils Absolute: 0.1 10*3/uL (ref 0.0–0.1)
Basophils Relative: 1 %
Eosinophils Absolute: 0.1 10*3/uL (ref 0.0–0.5)
Eosinophils Relative: 1 %
HCT: 29 % — ABNORMAL LOW (ref 36.0–46.0)
Hemoglobin: 9.4 g/dL — ABNORMAL LOW (ref 12.0–15.0)
Immature Granulocytes: 4 %
Lymphocytes Relative: 3 %
Lymphs Abs: 0.4 10*3/uL — ABNORMAL LOW (ref 0.7–4.0)
MCH: 32.8 pg (ref 26.0–34.0)
MCHC: 32.4 g/dL (ref 30.0–36.0)
MCV: 101 fL — ABNORMAL HIGH (ref 80.0–100.0)
Monocytes Absolute: 1.1 10*3/uL — ABNORMAL HIGH (ref 0.1–1.0)
Monocytes Relative: 10 %
Neutro Abs: 8.8 10*3/uL — ABNORMAL HIGH (ref 1.7–7.7)
Neutrophils Relative %: 81 %
Platelets: 121 10*3/uL — ABNORMAL LOW (ref 150–400)
RBC: 2.87 MIL/uL — ABNORMAL LOW (ref 3.87–5.11)
RDW: 16.3 % — ABNORMAL HIGH (ref 11.5–15.5)
WBC: 10.7 10*3/uL — ABNORMAL HIGH (ref 4.0–10.5)
nRBC: 0 % (ref 0.0–0.2)

## 2022-08-14 LAB — URINALYSIS, ROUTINE W REFLEX MICROSCOPIC
Glucose, UA: NEGATIVE mg/dL
Hgb urine dipstick: NEGATIVE
Ketones, ur: 5 mg/dL — AB
Nitrite: NEGATIVE
Protein, ur: 100 mg/dL — AB
Specific Gravity, Urine: 1.021 (ref 1.005–1.030)
pH: 5 (ref 5.0–8.0)

## 2022-08-14 LAB — CBG MONITORING, ED: Glucose-Capillary: 159 mg/dL — ABNORMAL HIGH (ref 70–99)

## 2022-08-14 LAB — BLOOD GAS, VENOUS
Acid-base deficit: 0.4 mmol/L (ref 0.0–2.0)
Bicarbonate: 25.4 mmol/L (ref 20.0–28.0)
O2 Saturation: 68.1 %
Patient temperature: 37
pCO2, Ven: 45 mmHg (ref 44–60)
pH, Ven: 7.36 (ref 7.25–7.43)
pO2, Ven: 43 mmHg (ref 32–45)

## 2022-08-14 LAB — RESP PANEL BY RT-PCR (FLU A&B, COVID) ARPGX2
Influenza A by PCR: NEGATIVE
Influenza B by PCR: NEGATIVE
SARS Coronavirus 2 by RT PCR: NEGATIVE

## 2022-08-14 LAB — TROPONIN I (HIGH SENSITIVITY): Troponin I (High Sensitivity): 14 ng/L (ref <18)

## 2022-08-14 LAB — PROCALCITONIN: Procalcitonin: 0.51 ng/mL

## 2022-08-14 LAB — AMMONIA: Ammonia: 37 umol/L — ABNORMAL HIGH (ref 9–35)

## 2022-08-14 LAB — GLUCOSE, CAPILLARY: Glucose-Capillary: 123 mg/dL — ABNORMAL HIGH (ref 70–99)

## 2022-08-14 LAB — LIPASE, BLOOD: Lipase: 28 U/L (ref 11–51)

## 2022-08-14 LAB — BRAIN NATRIURETIC PEPTIDE: B Natriuretic Peptide: 122.9 pg/mL — ABNORMAL HIGH (ref 0.0–100.0)

## 2022-08-14 MED ORDER — ONDANSETRON HCL 4 MG PO TABS
4.0000 mg | ORAL_TABLET | Freq: Four times a day (QID) | ORAL | Status: DC | PRN
  Administered 2022-08-16: 4 mg via ORAL
  Filled 2022-08-14: qty 1

## 2022-08-14 MED ORDER — SODIUM CHLORIDE 0.9 % IV SOLN
1.0000 g | Freq: Once | INTRAVENOUS | Status: AC
  Administered 2022-08-14: 1 g via INTRAVENOUS
  Filled 2022-08-14: qty 10

## 2022-08-14 MED ORDER — HYDROMORPHONE HCL 1 MG/ML IJ SOLN
0.5000 mg | Freq: Once | INTRAMUSCULAR | Status: AC
  Administered 2022-08-14: 0.5 mg via INTRAVENOUS
  Filled 2022-08-14: qty 1

## 2022-08-14 MED ORDER — ONDANSETRON HCL 4 MG/2ML IJ SOLN
4.0000 mg | Freq: Once | INTRAMUSCULAR | Status: AC
  Administered 2022-08-14: 4 mg via INTRAVENOUS
  Filled 2022-08-14: qty 2

## 2022-08-14 MED ORDER — LEVETIRACETAM IN NACL 1500 MG/100ML IV SOLN
1500.0000 mg | Freq: Two times a day (BID) | INTRAVENOUS | Status: DC
  Administered 2022-08-14 – 2022-08-17 (×6): 1500 mg via INTRAVENOUS
  Filled 2022-08-14 (×7): qty 100

## 2022-08-14 MED ORDER — HYDROMORPHONE HCL 1 MG/ML IJ SOLN
0.5000 mg | INTRAMUSCULAR | Status: AC | PRN
  Administered 2022-08-14 – 2022-08-16 (×10): 0.5 mg via INTRAVENOUS
  Filled 2022-08-14 (×6): qty 0.5
  Filled 2022-08-14: qty 1
  Filled 2022-08-14 (×3): qty 0.5

## 2022-08-14 MED ORDER — ACETAMINOPHEN 650 MG RE SUPP: 650.0000 mg | Freq: Four times a day (QID) | RECTAL | Status: DC | PRN

## 2022-08-14 MED ORDER — SODIUM CHLORIDE 0.9 % IV SOLN
500.0000 mg | Freq: Once | INTRAVENOUS | Status: AC
  Administered 2022-08-14: 500 mg via INTRAVENOUS
  Filled 2022-08-14: qty 5

## 2022-08-14 MED ORDER — ONDANSETRON HCL 4 MG/2ML IJ SOLN: 4.0000 mg | Freq: Four times a day (QID) | INTRAMUSCULAR | Status: DC | PRN

## 2022-08-14 MED ORDER — INSULIN ASPART 100 UNIT/ML IJ SOLN
0.0000 [IU] | Freq: Four times a day (QID) | INTRAMUSCULAR | Status: DC
  Administered 2022-08-14: 2 [IU] via SUBCUTANEOUS
  Administered 2022-08-15: 3 [IU] via SUBCUTANEOUS
  Administered 2022-08-16: 2 [IU] via SUBCUTANEOUS
  Filled 2022-08-14: qty 0.15

## 2022-08-14 MED ORDER — INSULIN ASPART 100 UNIT/ML IJ SOLN
0.0000 [IU] | Freq: Three times a day (TID) | INTRAMUSCULAR | Status: DC
  Administered 2022-08-14: 3 [IU] via SUBCUTANEOUS
  Filled 2022-08-14: qty 0.15

## 2022-08-14 MED ORDER — ACETAMINOPHEN 325 MG PO TABS
650.0000 mg | ORAL_TABLET | Freq: Four times a day (QID) | ORAL | Status: DC | PRN
  Administered 2022-08-17: 650 mg via ORAL
  Filled 2022-08-14: qty 2

## 2022-08-14 MED ORDER — SODIUM CHLORIDE 0.9 % IV SOLN
200.0000 mg | Freq: Two times a day (BID) | INTRAVENOUS | Status: DC
  Administered 2022-08-14 – 2022-08-17 (×6): 200 mg via INTRAVENOUS
  Filled 2022-08-14 (×8): qty 20

## 2022-08-14 MED ORDER — SODIUM CHLORIDE 0.9 % IV BOLUS
1000.0000 mL | Freq: Once | INTRAVENOUS | Status: AC
  Administered 2022-08-14: 1000 mL via INTRAVENOUS

## 2022-08-14 MED ORDER — IOHEXOL 350 MG/ML SOLN
100.0000 mL | Freq: Once | INTRAVENOUS | Status: AC | PRN
  Administered 2022-08-14: 100 mL via INTRAVENOUS

## 2022-08-14 MED ORDER — DEXAMETHASONE SODIUM PHOSPHATE 10 MG/ML IJ SOLN
8.0000 mg | Freq: Once | INTRAMUSCULAR | Status: AC
  Administered 2022-08-14: 8 mg via INTRAVENOUS
  Filled 2022-08-14: qty 1

## 2022-08-14 MED ORDER — DEXAMETHASONE SODIUM PHOSPHATE 4 MG/ML IJ SOLN
4.0000 mg | Freq: Four times a day (QID) | INTRAMUSCULAR | Status: DC
  Administered 2022-08-14 – 2022-08-15 (×4): 4 mg via INTRAVENOUS
  Filled 2022-08-14 (×4): qty 1

## 2022-08-14 NOTE — H&P (Signed)
History and Physical    Patient: Pamela [name] SAY:[id number] DOB: 09/06/1961 DOA: 08/14/2022 DOS: the patient was seen and examined on 08/14/2022 PCP: Marinda Elk, MD  Patient coming from: Home  Chief Complaint:  Chief Complaint  Patient presents with   Fatigue        HPI: Pamela [name] is a 61 y.o. 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  She was recently admitted to the ICU and intubated due to status epilepticus.  She has not been able to take her medications as usual due to difficulty swallowing.  Her overall oral intake is decreased.  Her urine looks very concentrated according to family members.  She is unable to provide any meaningful HPI given her current mental status.  ED course: Initial vital signs were temperature 98.8 F, pulse 103, respiration 18, BP 160/103 mmHg O2 sat 94% on room air.  The patient was briefly hypoxic at 89%.  Her O2 sat since then had being in the high 90s to 100%.  She received 1000 mL normal saline bolus, dexamethasone 8 mg IVP x 1, ondansetron 4 mg IVP hydromorphone 0.5 mg IVP x 2, ceftriaxone 1 g IVPB and azithromycin 500 mg IVPB.  Lab work: Urinalysis was hazy with small bilirubin, ketonuria 5 and proteinuria 100 mg/dL.  There was trace leukocyte esterase and many bacteria.  Venous blood gases normal.  Coronavirus [place] PCR was negative.  Ammonia level was 37 mol/L.  CBC with a white count of 10.7, hemoglobin 9.4 g/dL and platelets 121.  Lipase and troponin were normal.  BNP were 123 pg/mL.  CMP showed a sodium 131 and chloride of 96 mmol/L.  The rest of the electrolytes and renal function were normal after calcium correction.  Total protein 6.9 and albumin 2.5 g/dL.  AST 811,  ALT 82 and alkaline phosphatase 333 units/L.  Total bilirubin was 2.2 mg/dL.  Imaging: Portable chest radiograph showed progressing nodule versus acute airspace disease in the left perihilar lung.  CTA chest with no PE or acute intrathoracic pathology.  There was interval progression of disease with enlarging primary subpleural mass within the left lower lobe, progressive left pleural metastatic disease, progressive mediastinal, left hilar, left internal mammary and pericardial adenopathy, as well as innumerable enlarging hepatic metastasis and development of pathologic intra-abdominal adenopathy.  There is mild coronary artery calcification and aortic atherosclerosis.  Status post right hemicolectomy.   Review of Systems: As mentioned in the history of present illness. All other systems reviewed and are negative.  Past Medical History:  Diagnosis Date   Colon cancer ([place]) 2003   Diabetes mellitus without complication ([place])    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    Hypertension    Personal history of chemotherapy 2003   colon cancer   SCLC [place]) [place])    with metastasis to liver, brain   Uterine cancer ([place]) 2007   Past Surgical History:  Procedure Laterality Date   ABDOMINAL HYSTERECTOMY     BREAST EXCISIONAL BIOPSY Right 11/26/2010   neg/- PROLIFERATIVE FIBROCYSTIC CHANGE WITH ASSOCIATED    CHOLECYSTECTOMY     COLON SURGERY     2003   COLONOSCOPY     COLONOSCOPY WITH PROPOFOL N/A 12/11/2018   Procedure: COLONOSCOPY WITH PROPOFOL;  Surgeon: Loistine Simas  U, MD;  Location: [facility] ENDOSCOPY;  Service: Endoscopy;  Laterality: N/A;   ESOPHAGOGASTRODUODENOSCOPY (EGD) WITH PROPOFOL N/A 01/15/2022   Procedure: ESOPHAGOGASTRODUODENOSCOPY (EGD) WITH PROPOFOL;  Surgeon: Lesly Rubenstein, MD;  Location: [facility] ENDOSCOPY;  Service: Endoscopy;  Laterality: N/A;  DM   IR IMAGING GUIDED PORT INSERTION  11/20/2021   Social History:  reports that she quit smoking about 9 months  ago. Her smoking use included cigarettes. She has a 43.00 pack-year smoking history. She has never used smokeless tobacco. She reports that she does not currently use alcohol. She reports that she does not use drugs.  Allergies  Allergen Reactions   Rofecoxib Swelling    Family History  Problem Relation Age of Onset   Breast cancer Maternal Aunt 21   Breast cancer Paternal Aunt 23   Breast cancer Cousin 18   Breast cancer Cousin 31    Prior to Admission medications   Medication Sig Start Date End Date Taking? Authorizing Provider  acetaminophen (TYLENOL) 500 MG tablet Take 1,000 mg by mouth every 6 (six) hours as needed for headache or moderate pain.    [provider]  albuterol (VENTOLIN HFA) 108 (90 Base) MCG/ACT inhaler Inhale 2 puffs into the lungs as needed for wheezing or shortness of breath. 01/22/21   [provider]  dexamethasone (DECADRON) 4 MG tablet Take 1 tablet (4 mg total) by mouth every 6 (six) hours. 08/02/22 09/01/22  Donne Hazel, MD  fentaNYL (DURAGESIC) 12 MCG/HR Place 1 patch onto the skin every 3 (three) days. Patient not taking: Reported on 07/16/2022 11/02/21   Borders, Kirt Boys, NP  ibuprofen (ADVIL) 200 MG tablet Take 400 mg by mouth every 6 (six) hours as needed for headache (pain).    [provider]  lacosamide (VIMPAT) 200 MG TABS tablet Take 1 tablet (200 mg total) by mouth 2 (two) times daily. 08/02/22 09/01/22  Donne Hazel, MD  levETIRAcetam (KEPPRA) 750 MG tablet Take 2 tablets (1,500 mg total) by mouth 2 (two) times daily. 08/02/22 09/01/22  Donne Hazel, MD  levothyroxine (SYNTHROID) 125 MCG tablet TAKE 1 TABLET(125 MCG) BY MOUTH DAILY BEFORE BREAKFAST Patient taking differently: Take 125 mcg by mouth daily before breakfast. 05/17/22   [name], MD  lidocaine-prilocaine (EMLA) cream Apply to affected area once Patient taking differently: Apply 1 Application topically as needed (for port access). 05/06/22    [name], MD  Multiple Vitamin (MULTIVITAMIN) tablet Take 1 tablet by mouth daily.    [provider]  naloxone [facility]) nasal spray 4 mg/0.1 mL Place 0.4 mg into the nose as needed (overdose). 11/02/21   [provider]  ondansetron (ZOFRAN) 8 MG tablet Take 1 tablet (8 mg total) by mouth every 8 (eight) hours as needed for nausea or vomiting. Start on the third day after chemotherapy. Patient taking differently: Take 8 mg by mouth as needed for nausea or vomiting. 05/06/22   [name], MD  Oxycodone HCl 10 MG TABS Take 1 tablet (10 mg total) by mouth every 3 (three) hours as needed (breakthrough pain). Patient not taking: Reported on 07/16/2022 11/02/21   Borders, Kirt Boys, NP  pantoprazole (PROTONIX) 40 MG tablet Take 1 tablet (40 mg total) by mouth at bedtime. 08/02/22 09/01/22  Donne Hazel, MD  phenytoin (DILANTIN) 125 MG/5ML suspension Take 8 mLs (200 mg total) by mouth 2 (two) times daily. 08/12/22   [name], MD  potassium chloride SA (KLOR-CON M) 20 MEQ  tablet TAKE 2 TABLETS BY MOUTH TWICE DAILY 07/04/22   [name], MD  prochlorperazine (COMPAZINE) 10 MG tablet Take 1 tablet (10 mg total) by mouth every 6 (six) hours as needed for nausea or vomiting. Patient taking differently: Take 10 mg by mouth as needed for nausea or vomiting. 05/06/22   [name], MD  simvastatin (ZOCOR) 40 MG tablet Take 40 mg by mouth daily.    [provider]  sitaGLIPtin-metformin (JANUMET) 50-500 MG tablet Take 1 tablet by mouth 2 (two) times daily with a meal.    [provider]  traMADol (ULTRAM) 50 MG tablet Take 1 tablet (50 mg total) by mouth every 6 (six) hours as needed. 08/06/22   [name], MD    Physical Exam: Vitals:   08/14/22 1530 08/14/22 1545 08/14/22 1615 08/14/22 1630  BP: (!) 152/84  (!) 155/76 (!) 147/67  Pulse: 91  (!) 102 99  Resp: 13 12 15 13   Temp:      TempSrc:      SpO2: 98%  100% 97%   Physical  Exam Vitals and nursing note reviewed.  Constitutional:      General: She is not in acute distress.    Appearance: She is ill-appearing.  HENT:     Head: Normocephalic.     Nose: No rhinorrhea.     Mouth/Throat:     Mouth: Mucous membranes are dry.  Eyes:     General: No scleral icterus.    Pupils: Pupils are equal, round, and reactive to light.  Neck:     Vascular: No JVD.  Cardiovascular:     Rate and Rhythm: Normal rate and regular rhythm.     Heart sounds: S1 normal and S2 normal.  Pulmonary:     Effort: Pulmonary effort is normal.     Breath sounds: Examination of the left-lower field reveals decreased breath sounds and rales. Decreased breath sounds and rales present. No wheezing or rhonchi.  Abdominal:     General: Bowel sounds are normal. There is no distension.     Palpations: Abdomen is soft.     Tenderness: There is no abdominal tenderness. There is no guarding.  Musculoskeletal:     Cervical back: Neck supple.     Right lower leg: 2+ Edema present.     Left lower leg: 2+ Edema present.  Skin:    General: Skin is warm and dry.  Neurological:     General: No focal deficit present.     Mental Status: She is easily aroused. She is lethargic and disoriented.   Data Reviewed:  Results are pending, will review when available.  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 Resume dexamethasone parentally. Resume Keppra and Vimpat IVPB. Neurochecks every 4 hours. Overall prognosis is very poor. Consult palliative care medicine.  Active Problems:   Elevated liver enzymes With hyperammonemia Secondary to extensive liver mets. Monitor LFT's and ammonia level.    Acquired hypothyroidism If unable to swallow tomorrow: Resume levothyroxine IV.    Chronic anemia/thrombocytopenia Monitor CBC daily.    Essential  hypertension Hold antihypertensives for now. Monitor blood pressure.    Type 2 diabetes mellitus without complication,    without long-term current use of insulin ([facility]) N.p.o. for now. However, on high-dose dexamethasone. CBG monitoring with RI SS.    Hyponatremia Mild hyperglycemia and poor oral intake.  Follow sodium level.    Abnormal urinalysis There is no fever or suprapubic tenderness. Questionable airspace disease earlier as well. Check procalcitonin level now in AM. Defer antibiotics pending procalcitonin in the morning.       Advance Care Planning:   Code Status: Full Code   Consults: Palliative care medicine.  Family Communication: Her brother and daughter-in-law were at bedside.  Severity of Illness: The appropriate patient status for this patient is INPATIENT. Inpatient status is judged to be reasonable and necessary in order to provide the required intensity of service to ensure the patient's safety. The patient's presenting symptoms, physical exam findings, and initial radiographic and laboratory data in the context of their chronic comorbidities is felt to place them at high risk for further clinical deterioration. Furthermore, it is not anticipated that the patient will be medically stable for discharge from the hospital within 2 midnights of admission.   * I certify that at the point of admission it is my clinical judgment that the patient will require inpatient hospital care spanning beyond 2 midnights from the point of admission due to high intensity of service, high risk for further deterioration and high frequency of surveillance required.*  Author: Reubin Milan, MD 08/14/2022 5:12 PM  For on call review [email or web address].   This document was prepared using Dragon voice recognition software and may contain some unintended transcription errors.

## 2022-08-14 NOTE — ED Triage Notes (Signed)
Pt arrived via POV with family. Per family, pt has been more lethargic and not eating or drinking as normal. Worsening wkn since recent d/c.

## 2022-08-14 NOTE — Progress Notes (Signed)
Patient was scheduled to come into the office today for hospital follow up and discussion of treatment. This morning, her family took her to the ED for decreased mental status, and general failure to thrive. Will follow for possible admission and subsequent office follow up.   Oncology Nurse Navigator Documentation     08/14/2022    2:30 PM  Oncology Nurse Navigator Flowsheets  Navigator Follow Up Date: 08/19/2022  Navigator Follow Up Reason: Appointment Review  Navigator Location [facility]  Navigator Encounter Type Appt/Treatment Plan Review  Patient Visit Type MedOnc  Treatment Phase Active Tx  Barriers/Navigation Needs Coordination of Care;Education  Interventions None Required  Acuity Level 2-Minimal Needs (1-2 Barriers Identified)  Support Groups/Services Friends and Family  Time Spent with Patient 15

## 2022-08-14 NOTE — ED Provider Notes (Signed)
Patient seen after prior EDP.  Patient will require admission.  Patient patient's family informed of ED evaluation findings.  Hospitalist service made aware of case and will evaluate for admission.    Valarie Merino, MD 08/14/22 [phone]

## 2022-08-14 NOTE — ED Provider Notes (Signed)
[place] DEPT Provider Note   CSN: [id number] Arrival date & time: 08/14/22  1016     History  Chief Complaint  Patient presents with   Fatigue         Pamela [name] is a 61 y.o. female.  Patient as above with significant medical history as below, including colon ca, brain mets, SCLC, uterine cancer  who presents to the ED with complaint of fatigue, poor po, unable to take meds She had appt w/ Dr Marin Olp this AM, sent to ED for eval instead Daughter at bedside Pt declining since thanksgiving but worse in the past week.  Unable to tolerate medications this am, not eating, reduced responsiveness from baseline Hx prior cva w/ left sided deficit and dysphagia; but typically can swallow  No fever/ vomiting/dib/abd pain/rashes/sick contacts/ falls/ med changes On arrival pt was hypoxic on RA, on RA at home, placed on 4L w/ improvement to resp status.      Past Medical History:  Diagnosis Date   Colon cancer (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, brain   Uterine cancer ([place]) 2007    Past Surgical History:  Procedure Laterality Date   ABDOMINAL HYSTERECTOMY     BREAST EXCISIONAL BIOPSY Right 11/26/2010   neg/- PROLIFERATIVE FIBROCYSTIC CHANGE WITH ASSOCIATED    CHOLECYSTECTOMY     COLON SURGERY     2003   COLONOSCOPY     COLONOSCOPY WITH PROPOFOL N/A 12/11/2018   Procedure: COLONOSCOPY WITH PROPOFOL;  Surgeon: Lollie Sails, MD;  Location: [facility] ENDOSCOPY;  Service: Endoscopy;  Laterality: N/A;   ESOPHAGOGASTRODUODENOSCOPY (EGD) WITH PROPOFOL N/A 01/15/2022   Procedure: ESOPHAGOGASTRODUODENOSCOPY (EGD) WITH PROPOFOL;  Surgeon: Lesly Rubenstein, MD;  Location: [facility] ENDOSCOPY;  Service: Endoscopy;  Laterality: N/A;  DM   IR IMAGING  GUIDED PORT INSERTION  11/20/2021     The history is provided by the patient and a relative. The history is limited by the condition of the patient. No language interpreter was used.       Home Medications Prior to Admission medications   Medication Sig Start Date End Date Taking? Authorizing Provider  acetaminophen (TYLENOL) 500 MG tablet Take 1,000 mg by mouth every 6 (six) hours as needed for headache or moderate pain.    [provider]  albuterol (VENTOLIN HFA) 108 (90 Base) MCG/ACT inhaler Inhale 2 puffs into the lungs as needed for wheezing or shortness of breath. 01/22/21   [provider]  dexamethasone (DECADRON) 4 MG tablet Take 1 tablet (4 mg total) by mouth every 6 (six) hours. 08/02/22 09/01/22  Donne Hazel, MD  fentaNYL (DURAGESIC) 12 MCG/HR Place 1 patch onto the skin every 3 (three) days. Patient not taking: Reported on 07/16/2022 11/02/21   Borders, Kirt Boys, NP  ibuprofen (ADVIL) 200 MG tablet Take 400 mg by mouth every 6 (six) hours as needed for headache (pain).    [provider]  lacosamide (VIMPAT) 200 MG TABS tablet Take 1 tablet (200 mg total) by mouth 2 (two) times daily. 08/02/22 09/01/22  Donne Hazel, MD  levETIRAcetam (KEPPRA) 750 MG tablet Take 2 tablets (1,500 mg total) by mouth 2 (two) times daily. 08/02/22 09/01/22  Donne Hazel, MD  levothyroxine (SYNTHROID)  125 MCG tablet TAKE 1 TABLET(125 MCG) BY MOUTH DAILY BEFORE BREAKFAST Patient taking differently: Take 125 mcg by mouth daily before breakfast. 05/17/22   [name], MD  lidocaine-prilocaine (EMLA) cream Apply to affected area once Patient taking differently: Apply 1 Application topically as needed (for port access). 05/06/22   [name], MD  Multiple Vitamin (MULTIVITAMIN) tablet Take 1 tablet by mouth daily.    [provider]  naloxone [facility]) nasal spray 4 mg/0.1 mL Place 0.4 mg into the nose as needed (overdose). 11/02/21   [provider]  ondansetron (ZOFRAN) 8 MG tablet Take 1 tablet (8 mg total) by mouth every 8 (eight) hours as needed for nausea or vomiting. Start on the third day after chemotherapy. Patient taking differently: Take 8 mg by mouth as needed for nausea or vomiting. 05/06/22   [name], MD  Oxycodone HCl 10 MG TABS Take 1 tablet (10 mg total) by mouth every 3 (three) hours as needed (breakthrough pain). Patient not taking: Reported on 07/16/2022 11/02/21   Borders, Kirt Boys, NP  pantoprazole (PROTONIX) 40 MG tablet Take 1 tablet (40 mg total) by mouth at bedtime. 08/02/22 09/01/22  Donne Hazel, MD  phenytoin (DILANTIN) 125 MG/5ML suspension Take 8 mLs (200 mg total) by mouth 2 (two) times daily. 08/12/22   [name], MD  potassium chloride SA (KLOR-CON M) 20 MEQ tablet TAKE 2 TABLETS BY MOUTH TWICE DAILY 07/04/22   [name], MD  prochlorperazine (COMPAZINE) 10 MG tablet Take 1 tablet (10 mg total) by mouth every 6 (six) hours as needed for nausea or vomiting. Patient taking differently: Take 10 mg by mouth as needed for nausea or vomiting. 05/06/22   [name], MD  simvastatin (ZOCOR) 40 MG tablet Take 40 mg by mouth daily.    [provider]  sitaGLIPtin-metformin (JANUMET) 50-500 MG tablet Take 1 tablet by mouth 2 (two) times daily with a meal.    [provider]  traMADol (ULTRAM) 50 MG tablet Take 1 tablet (50 mg total) by mouth every 6 (six) hours as needed. 08/06/22   [name], MD      Allergies    Rofecoxib    Review of Systems   Review of Systems  Constitutional:  Positive for appetite change and fatigue. Negative for activity change and fever.  HENT:  Negative for facial swelling and trouble swallowing.   Eyes:  Negative for discharge and redness.  Respiratory:  Negative for cough and shortness of breath.   Cardiovascular:  Negative for chest pain and palpitations.  Gastrointestinal:  Negative for abdominal pain and nausea.  Genitourinary:   Negative for dysuria and flank pain.  Musculoskeletal:  Negative for back pain and gait problem.  Skin:  Negative for pallor and rash.  Neurological:  Positive for weakness. Negative for syncope and headaches.  All other systems reviewed and are negative.   Physical Exam Updated Vital Signs BP (!) 155/76   Pulse (!) 102   Temp 98.9 F (37.2 C) (Oral)   Resp 15   SpO2 100%  Physical Exam Vitals and nursing note reviewed.  Constitutional:      General: She is not in acute distress.    Appearance: Normal appearance.  HENT:     Head: Normocephalic and atraumatic. No raccoon eyes, Battle's sign, right periorbital erythema or left periorbital erythema.     Jaw: No trismus.     Comments: No drooling stridor trismus No  angioedema     Right Ear: External ear normal.     Left Ear: External ear normal.     Nose: Nose normal.     Mouth/Throat:     Mouth: Mucous membranes are dry.  Eyes:     General: No scleral icterus.       Right eye: No discharge.        Left eye: No discharge.  Cardiovascular:     Rate and Rhythm: Regular rhythm. Tachycardia present.     Pulses: Normal pulses.     Heart sounds: Normal heart sounds.  Pulmonary:     Effort: Pulmonary effort is normal. Tachypnea present. No respiratory distress.     Breath sounds: Decreased breath sounds present.  Abdominal:     General: Abdomen is flat.     Tenderness: There is no abdominal tenderness.  Musculoskeletal:        General: Normal range of motion.     Cervical back: Normal range of motion.     Right lower leg: No edema.     Left lower leg: No edema.  Skin:    General: Skin is warm and dry.     Capillary Refill: Capillary refill takes less than 2 seconds.  Neurological:     Mental Status: She is alert. She is confused.     GCS: GCS eye subscore is 4. GCS verbal subscore is 5. GCS motor subscore is 6.     Motor: Weakness present.     Comments: Left sided residual deficit  Left sided weakness  Speech is  soft/quiet but fluent    Psychiatric:        Mood and Affect: Mood normal.        Behavior: Behavior normal.     ED Results / Procedures / Treatments   Labs (all labs ordered are listed, but only abnormal results are displayed) Labs Reviewed  CBC WITH DIFFERENTIAL/PLATELET - Abnormal; Notable for the following components:      Result Value   WBC 10.7 (*)    RBC 2.87 (*)    Hemoglobin 9.4 (*)    HCT 29.0 (*)    MCV 101.0 (*)    RDW 16.3 (*)    Platelets 121 (*)    Neutro Abs 8.8 (*)    Lymphs Abs 0.4 (*)    Monocytes Absolute 1.1 (*)    Abs Immature Granulocytes 0.46 (*)    All other components within normal limits  COMPREHENSIVE METABOLIC PANEL - Abnormal; Notable for the following components:   Sodium 131 (*)    Chloride 96 (*)    Glucose, Bld 153 (*)    Calcium 8.3 (*)    Albumin 2.5 (*)    AST 111 (*)    ALT 82 (*)    Alkaline Phosphatase 333 (*)    Total Bilirubin 2.2 (*)    All other components within normal limits  URINALYSIS, ROUTINE W REFLEX MICROSCOPIC - Abnormal; Notable for the following components:   Color, Urine AMBER (*)    APPearance HAZY (*)    Bilirubin Urine SMALL (*)    Ketones, ur 5 (*)    Protein, ur 100 (*)    Leukocytes,Ua TRACE (*)    Bacteria, UA MANY (*)    All other components within normal limits  BRAIN NATRIURETIC PEPTIDE - Abnormal; Notable for the following components:   B Natriuretic Peptide 122.9 (*)    All other components within normal limits  AMMONIA - Abnormal; Notable for the  following components:   Ammonia 37 (*)    All other components within normal limits  RESP PANEL BY RT-PCR (FLU A&B, COVID) ARPGX2  URINE CULTURE  LIPASE, BLOOD  BLOOD GAS, VENOUS  TSH  T4, FREE  PHENYTOIN LEVEL, FREE AND TOTAL  LEVETIRACETAM LEVEL  TROPONIN I (HIGH SENSITIVITY)  TROPONIN I (HIGH SENSITIVITY)    EKG None  Radiology DG Chest Portable 1 View  Result Date: 08/14/2022 CLINICAL DATA:  Fatigue.  History malignancy EXAM:  PORTABLE CHEST 1 VIEW COMPARISON:  07/16/2022 FINDINGS: Lobulated opacity in the left perihilar lung, notably increased from prior. Lung volumes are low with mild atelectasis at the bases. No edema, effusion, or pneumothorax. Normal heart size. Porta catheter on the right. IMPRESSION: Progressive nodule versus acute airspace disease in the left perihilar lung. Electronically Signed   By: Jorje Guild M.D.   On: 08/14/2022 12:29    Procedures .Critical Care  Performed by: Jeanell Sparrow, DO Authorized by: Jeanell Sparrow, DO   Critical care provider statement:    Critical care time (minutes):  30   Critical care time was exclusive of:  Separately billable procedures and treating other patients   Critical care was necessary to treat or prevent imminent or life-threatening deterioration of the following conditions:  Respiratory failure   Critical care was time spent personally by me on the following activities:  Development of treatment plan with patient or surrogate, discussions with consultants, evaluation of patient's response to treatment, examination of patient, ordering and review of laboratory studies, ordering and review of radiographic studies, ordering and performing treatments and interventions, pulse oximetry, re-evaluation of patient's condition, review of old charts and obtaining history from patient or surrogate     Medications Ordered in ED Medications  sodium chloride 0.9 % bolus 1,000 mL (1,000 mLs Intravenous New Bag/Given 08/14/22 1204)  HYDROmorphone (DILAUDID) injection 0.5 mg (0.5 mg Intravenous Given 08/14/22 1230)  ondansetron (ZOFRAN) injection 4 mg (4 mg Intravenous Given 08/14/22 1230)  HYDROmorphone (DILAUDID) injection 0.5 mg (0.5 mg Intravenous Given 08/14/22 1434)  cefTRIAXone (ROCEPHIN) 1 g in sodium chloride 0.9 % 100 mL IVPB (1 g Intravenous New Bag/Given 08/14/22 1440)  azithromycin (ZITHROMAX) 500 mg in sodium chloride 0.9 % 250 mL IVPB (500 mg Intravenous  New Bag/Given 08/14/22 1512)  iohexol (OMNIPAQUE) 350 MG/ML injection 100 mL (100 mLs Intravenous Contrast Given 08/14/22 1548)    ED Course/ Medical Decision Making/ A&P                           Medical Decision Making Amount and/or Complexity of Data Reviewed Labs: ordered. Radiology: ordered.  Risk Prescription drug management. Decision regarding hospitalization.   This patient presents to the ED with chief complaint(s) of ams/poor po with pertinent past medical history of above which further complicates the presenting complaint. The complaint involves an extensive differential diagnosis and also carries with it a high risk of complications and morbidity.     Differential diagnoses for altered mental status includes but is not exclusive to alcohol, illicit or prescription medications, intracranial pathology such as stroke, intracerebral hemorrhage, fever or infectious causes including sepsis, hypoxemia, uremia, trauma, endocrine related disorders such as diabetes, hypoglycemia, thyroid-related diseases, etc.  . Serious etiologies were considered.   The initial plan is to screening labs imaging ivf analgesia    Additional history obtained: Additional history obtained from family Records reviewed Primary Care Documents and prior admission, prior labs/imaging / home meds  Independent labs interpretation:  The following labs were independently interpreted:  UA c/w infection, send culture Mild leukocytosis, hgb similar to baseline on CBC CMP similar to her baseline Trop neg x1 Lipase wnl   Independent visualization of imaging: - I independently visualized the following imaging with scope of interpretation limited to determining acute life threatening conditions related to emergency care: CXR, CTH, CTPE, CTAP, which revealed CXR concerning for ?pna. She is hypoxic. CT imaging pending, I have high concern for a PE given her respiratory status change/ malig hx/  tachycardia/hypoxia.   Cardiac monitoring was reviewed and interpreted by myself which shows sinus tachy  Treatment and Reassessment: IVF Rocephin Azithro Dilaudid Zofran 2LNC >> improved   Consultation: - Consulted or discussed management/test interpretation w/ external professional: Updated onc Dr Marin Olp regarding admission  Consideration for admission or further workup: Admission was considered   Pt with pos PNA, UTI, hypoxia. Does not appear to have sepsis. CT imaging is pending but will require admission for the above. D/w family at length at bedside, agreeable to plan. Will continue 2LNC (no home o2 requirement typically) and will order scheduled analgesia.  Signed out to incoming EDP pending imaging/admission    Social Determinants of health: Social History   Tobacco Use   Smoking status: Former    Packs/day: 1.00    Years: 43.00    Total pack years: 43.00    Types: Cigarettes    Quit date: 10/17/2021    Years since quitting: 0.8   Smokeless tobacco: Never  Vaping Use   Vaping Use: Never used  Substance Use Topics   Alcohol use: Not Currently   Drug use: Never            Final Clinical Impression(s) / ED Diagnoses Final diagnoses:  Metastatic malignant neoplasm, unspecified site [facility])  Respiratory distress  Urinary tract infection without hematuria, site unspecified  Pneumonia due to infectious organism, unspecified laterality, unspecified part of lung    Rx / DC Orders ED Discharge Orders     None         Jeanell Sparrow, DO 08/14/22 1619

## 2022-08-14 NOTE — ED Notes (Signed)
Call lab to add on urine culture

## 2022-08-15 ENCOUNTER — Other Ambulatory Visit: Payer: Self-pay

## 2022-08-15 ENCOUNTER — Encounter: Payer: Self-pay | Admitting: *Deleted

## 2022-08-15 ENCOUNTER — Inpatient Hospital Stay (HOSPITAL_COMMUNITY): Payer: BC Managed Care – PPO

## 2022-08-15 DIAGNOSIS — C787 Secondary malignant neoplasm of liver and intrahepatic bile duct: Secondary | ICD-10-CM | POA: Diagnosis not present

## 2022-08-15 DIAGNOSIS — C729 Malignant neoplasm of central nervous system, unspecified: Secondary | ICD-10-CM | POA: Diagnosis not present

## 2022-08-15 DIAGNOSIS — R0603 Acute respiratory distress: Secondary | ICD-10-CM | POA: Diagnosis not present

## 2022-08-15 DIAGNOSIS — Z515 Encounter for palliative care: Secondary | ICD-10-CM

## 2022-08-15 DIAGNOSIS — J189 Pneumonia, unspecified organism: Secondary | ICD-10-CM | POA: Diagnosis not present

## 2022-08-15 DIAGNOSIS — Z7189 Other specified counseling: Secondary | ICD-10-CM

## 2022-08-15 DIAGNOSIS — C799 Secondary malignant neoplasm of unspecified site: Secondary | ICD-10-CM | POA: Diagnosis not present

## 2022-08-15 DIAGNOSIS — C3490 Malignant neoplasm of unspecified part of unspecified bronchus or lung: Secondary | ICD-10-CM | POA: Diagnosis not present

## 2022-08-15 LAB — COMPREHENSIVE METABOLIC PANEL
ALT: 81 U/L — ABNORMAL HIGH (ref 0–44)
AST: 122 U/L — ABNORMAL HIGH (ref 15–41)
Albumin: 2.4 g/dL — ABNORMAL LOW (ref 3.5–5.0)
Alkaline Phosphatase: 305 U/L — ABNORMAL HIGH (ref 38–126)
Anion gap: 12 (ref 5–15)
BUN: 18 mg/dL (ref 8–23)
CO2: 25 mmol/L (ref 22–32)
Calcium: 8.5 mg/dL — ABNORMAL LOW (ref 8.9–10.3)
Chloride: 97 mmol/L — ABNORMAL LOW (ref 98–111)
Creatinine, Ser: 0.71 mg/dL (ref 0.44–1.00)
GFR, Estimated: >60 mL/min (ref >60)
Glucose, Bld: 112 mg/dL — ABNORMAL HIGH (ref 70–99)
Potassium: 3.7 mmol/L (ref 3.5–5.1)
Sodium: 134 mmol/L — ABNORMAL LOW (ref 135–145)
Total Bilirubin: 1.8 mg/dL — ABNORMAL HIGH (ref 0.3–1.2)
Total Protein: 6.3 g/dL — ABNORMAL LOW (ref 6.5–8.1)

## 2022-08-15 LAB — CBC
HCT: 26.5 % — ABNORMAL LOW (ref 36.0–46.0)
Hemoglobin: 8.5 g/dL — ABNORMAL LOW (ref 12.0–15.0)
MCH: 32.7 pg (ref 26.0–34.0)
MCHC: 32.1 g/dL (ref 30.0–36.0)
MCV: 101.9 fL — ABNORMAL HIGH (ref 80.0–100.0)
Platelets: 115 10*3/uL — ABNORMAL LOW (ref 150–400)
RBC: 2.6 MIL/uL — ABNORMAL LOW (ref 3.87–5.11)
RDW: 16.1 % — ABNORMAL HIGH (ref 11.5–15.5)
WBC: 7.9 10*3/uL (ref 4.0–10.5)
nRBC: 0 % (ref 0.0–0.2)

## 2022-08-15 LAB — PROCALCITONIN: Procalcitonin: 0.53 ng/mL

## 2022-08-15 LAB — GLUCOSE, CAPILLARY
Glucose-Capillary: 109 mg/dL — ABNORMAL HIGH (ref 70–99)
Glucose-Capillary: 129 mg/dL — ABNORMAL HIGH (ref 70–99)
Glucose-Capillary: 109 mg/dL — ABNORMAL HIGH (ref 70–99)
Glucose-Capillary: 161 mg/dL — ABNORMAL HIGH (ref 70–99)

## 2022-08-15 LAB — TROPONIN I (HIGH SENSITIVITY): Troponin I (High Sensitivity): 13 ng/L (ref <18)

## 2022-08-15 LAB — T4, FREE: Free T4: 0.8 ng/dL (ref 0.61–1.12)

## 2022-08-15 LAB — TSH: TSH: 4.691 u[IU]/mL — ABNORMAL HIGH (ref 0.350–4.500)

## 2022-08-15 MED ORDER — ALBUTEROL SULFATE (2.5 MG/3ML) 0.083% IN NEBU: 2.5000 mg | INHALATION_SOLUTION | RESPIRATORY_TRACT | Status: DC | PRN

## 2022-08-15 MED ORDER — MORPHINE SULFATE (CONCENTRATE) 10 MG/0.5ML PO SOLN
10.0000 mg | ORAL | Status: DC | PRN
  Administered 2022-08-16: 10 mg via ORAL
  Filled 2022-08-15: qty 0.5

## 2022-08-15 MED ORDER — LEVOTHYROXINE SODIUM 25 MCG PO TABS
125.0000 ug | ORAL_TABLET | Freq: Every day | ORAL | Status: DC
  Administered 2022-08-16 – 2022-08-17 (×2): 125 ug via ORAL
  Filled 2022-08-15 (×2): qty 1

## 2022-08-15 MED ORDER — CHLORHEXIDINE GLUCONATE CLOTH 2 % EX PADS
6.0000 | MEDICATED_PAD | Freq: Every day | CUTANEOUS | Status: DC
  Administered 2022-08-15 – 2022-08-16 (×2): 6 via TOPICAL

## 2022-08-15 MED ORDER — DEXAMETHASONE SODIUM PHOSPHATE 4 MG/ML IJ SOLN
4.0000 mg | Freq: Two times a day (BID) | INTRAMUSCULAR | Status: DC
  Administered 2022-08-16 – 2022-08-17 (×3): 4 mg via INTRAVENOUS
  Filled 2022-08-15 (×3): qty 1

## 2022-08-15 MED ORDER — PHENYTOIN 125 MG/5ML PO SUSP
200.0000 mg | Freq: Two times a day (BID) | ORAL | Status: DC
  Administered 2022-08-15 – 2022-08-17 (×5): 200 mg via ORAL
  Filled 2022-08-15 (×5): qty 8

## 2022-08-15 MED ORDER — GADOBUTROL 1 MMOL/ML IV SOLN
7.0000 mL | Freq: Once | INTRAVENOUS | Status: AC | PRN
  Administered 2022-08-15: 7 mL via INTRAVENOUS

## 2022-08-15 NOTE — Progress Notes (Signed)
Patient's daughter, Duwaine Maxin, called with multiple questions about current hospitalization, current treatment plan and prognosis. I was able to answer her questions to her satisfaction.   She understands that patient is no longer a candidate for treatment and that Dr Marin Olp has discussed Hospice with her. Candice is going to talk to the family today, but believes the family would be in agreement to hospice as well. She asks about residential hospice in [place] and what their requirements for admission would be.   Called and spoke to the referral coordinator regarding admission requirements for Hospice Home. They state that patient doesn't necessarily need to have a certain life expectancy, but that the patient needs to have acute care needs that wouldn't be manageable by the family at home. I shared this with Candice and she'll speak to family about this as well.   Updated Dr Marin Olp to our conversation.   Oncology Nurse Navigator Documentation     08/15/2022   10:30 AM  Oncology Nurse Navigator Flowsheets  Navigator Follow Up Date: 08/19/2022  Navigator Follow Up Reason: Appointment Review  Navigator Location [facility]  Navigator Encounter Type Telephone  Telephone Education;Incoming Call  Patient Visit Type MedOnc  Treatment Phase Active Tx  Barriers/Navigation Needs Coordination of Care;Education  Education Pain/ Symptom Management;Other  Interventions Education;Psycho-Social Support  Acuity Level 2-Minimal Needs (1-2 Barriers Identified)  Education Method Verbal  Support Groups/Services Friends and Family  Time Spent with Patient 63

## 2022-08-15 NOTE — Consult Note (Signed)
Consultation Note Date: 08/15/2022   Patient Name: Pamela [name]  DOB: May 31, 1961  MRN: [id number]  Age / Sex: 61 y.o., female  PCP: Marinda Elk, MD Referring Physician: Donne Hazel, MD  Reason for Consultation: Establishing goals of care  HPI/Patient Profile: 61 y.o. female  admitted on 08/14/2022   Clinical Assessment and Goals of Care: 61 year old lady who follows with Dr. 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  Family brought her in because she was having difficulty swallowing, decreased oral intake and not able to take medications by mouth.  At home patient is on oxycodone and tramadol.  Patient had a CT scan showing interval progression and enlarging primary subpleural mass in the left lower lobe, progressive left pleural metastatic disease, progressive mediastinal left hilar and left internal mammary and pericardial adenopathy.  Also noted to have innumerable enlarging hepatic metastases.  PMT consulted for ongoing goals of care discussions.  Patient is awake alert sitting up in bed.  She appears mildly restless.  She appears in pain and discomfort.  Son and daughter-in-law present at bedside.  Palliative medicine is specialized medical care for people living with serious illness. It focuses on providing relief from the symptoms and stress of a serious illness. The goal is to improve quality of life for both the patient and the family. Goals of care: Broad aims of medical therapy  in relation to the patient's values and preferences. Our aim is to provide medical care aimed at enabling patients to achieve the goals that matter most to them, given the circumstances of their particular medical situation and their constraints.   Goals wishes and values important to the patient and family as a unit attempted to be explored.  Discussed about patient's current condition.  Brief life review performed.  Introduced scope of palliative services as being primarily for symptom management at this standpoint and also to be an additional layer of support as further diagnosis is underway.  Patient awaiting MRI of the brain.  PMT to follow along.  NEXT OF KIN Son and daughter-in-law present at bedside.  Lives at home with husband G[place] in [place], [place].  SUMMARY OF RECOMMENDATIONS   Full code full scope for now, continue IV Dilaudid as needed, MRI of the brain to be done, Dr. Marin Olp following. Discussed with TRH MD, thank you for the consult, PMT to follow along.  Code Status/Advance Care Planning: Full code   Symptom Management:    Palliative Prophylaxis:  Delirium Protocol  Additional Recommendations (Limitations, Scope, Preferences): Full Scope Treatment  Psycho-social/Spiritual:  Desire for further Chaplaincy support:yes Additional Recommendations: Caregiving  Support/Resources  Prognosis:  Unable to determine  Discharge  Planning: To Be Determined      Primary Diagnoses: Present on Admission:  Intracerebral bleed ([place])  Acquired hypothyroidism  Abnormal urinalysis  Chronic anemia  Essential hypertension  Elevated liver enzymes  Hyponatremia  SCLC (small cell lung carcinoma) (HCC)  Thrombocytopenia ([place])   I have reviewed the medical record, interviewed the patient and family, and examined the patient. The following aspects are pertinent.  Past Medical History:  Diagnosis Date   Colon cancer ([place]) 2003   Diabetes mellitus without  complication ([place])    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    Hypertension    Personal history of chemotherapy 2003   colon cancer   SCLC (small cell lung carcinoma) (HCC)    with metastasis to liver, brain   Uterine cancer ([place]) 2007   Social History   Socioeconomic History   Marital status: Married    Spouse name: Not on file   Number of children: Not on file   Years of education: Not on file   Highest education level: Not on file  Occupational History   Not on file  Tobacco Use   Smoking status: Former    Packs/day: 1.00    Years: 43.00    Total pack years: 43.00    Types: Cigarettes    Quit date: 10/17/2021    Years since quitting: 0.8   Smokeless tobacco: Never  Vaping Use   Vaping Use: Never used  Substance and Sexual Activity   Alcohol use: Not Currently   Drug use: Never   Sexual activity: Not on file  Other Topics Concern   Not on file  Social History Narrative   Not on file   Social Determinants of Health   Financial Resource Strain: Low Risk  (07/10/2022)   Overall Financial Resource Strain (CARDIA)    Difficulty of Paying Living Expenses: Not very hard  Food Insecurity: No Food Insecurity (07/21/2022)   Hunger Vital Sign    Worried About Running Out of Food in the Last Year: Never true    [place] in the Last Year: Never true  Transportation Needs: No Transportation Needs (08/15/2022)   PRAPARE - Hydrologist (Medical): No    Lack of Transportation (Non-Medical): No  Physical Activity: Not on file  Stress: Not on file  Social Connections: Not on file   Family History  Problem Relation Age of Onset   Breast cancer Maternal Aunt 60   Breast cancer Paternal Aunt 69   Breast cancer Cousin 18   Breast cancer Cousin 32   Scheduled Meds:  Chlorhexidine Gluconate Cloth  6 each Topical Daily   dexamethasone (DECADRON) injection  4 mg Intravenous Q6H   insulin aspart  0-15 Units Subcutaneous Q6H   [START ON  08/16/2022] levothyroxine  125 mcg Oral Q0600   phenytoin  200 mg Oral BID   Continuous Infusions:  lacosamide (VIMPAT) IV 200 mg (08/15/22 1052)   levETIRAcetam 1,500 mg (08/15/22 0552)   PRN Meds:.acetaminophen **OR** acetaminophen, albuterol, HYDROmorphone (DILAUDID) injection, ondansetron **OR** ondansetron (ZOFRAN) IV Medications Prior to Admission:  Prior to Admission medications   Medication Sig Start Date End Date Taking? Authorizing Provider  acetaminophen (TYLENOL) 500 MG tablet Take 1,000 mg by mouth every 6 (six) hours as needed for headache or moderate pain.   Yes [provider]  albuterol (VENTOLIN HFA) 108 (90 Base) MCG/ACT inhaler Inhale 2 puffs into the lungs as needed for wheezing or shortness of breath. 01/22/21  Yes [provider]  dexamethasone (DECADRON) 4 MG tablet Take 1 tablet (4 mg total) by mouth every 6 (six) hours. 08/02/22 09/01/22 Yes Donne Hazel, MD  fentaNYL (DURAGESIC) 12 MCG/HR Place 1 patch onto the skin every 3 (three) days. 11/02/21  Yes Borders, Kirt Boys, NP  ibuprofen (ADVIL) 200 MG tablet Take 400 mg by mouth every 6 (six) hours as needed for headache (pain).   Yes [provider]  lacosamide (VIMPAT) 200 MG TABS tablet Take 1 tablet (200 mg total) by mouth 2 (two) times daily. 08/02/22 09/01/22 Yes Donne Hazel, MD  levETIRAcetam (KEPPRA) 750 MG tablet Take 2 tablets (1,500 mg total) by mouth 2 (two) times daily. 08/02/22 09/01/22 Yes Donne Hazel, MD  levothyroxine (SYNTHROID) 125 MCG tablet TAKE 1 TABLET(125 MCG) BY MOUTH DAILY BEFORE BREAKFAST Patient taking differently: Take 125 mcg by mouth daily before breakfast. 05/17/22  Yes Ennever, Rudell Cobb, MD  lidocaine-prilocaine (EMLA) cream Apply to affected area once Patient taking differently: Apply 1 Application topically as needed (for port access). 05/06/22  Yes [name], MD  Multiple Vitamin (MULTIVITAMIN) tablet Take 1 tablet by mouth daily.   Yes [provider]  naloxone (NARCAN) nasal spray 4 mg/0.1 mL Place 0.4 mg into the nose as needed (overdose). 11/02/21  Yes [provider]  ondansetron (ZOFRAN) 8 MG tablet Take 1 tablet (8 mg total) by mouth every 8 (eight) hours as needed for nausea or vomiting. Start on the third day after chemotherapy. Patient taking differently: Take 8 mg by mouth every 8 (eight) hours as needed for nausea or vomiting. 05/06/22  Yes Ennever, Rudell Cobb, MD  Oxycodone HCl 10 MG TABS Take 1 tablet (10 mg total) by mouth every 3 (three) hours as needed (breakthrough pain). 11/02/21  Yes Borders, Kirt Boys, NP  pantoprazole (PROTONIX) 40 MG tablet Take 1 tablet (40 mg total) by mouth at bedtime. 08/02/22 09/01/22 Yes Donne Hazel, MD  phenytoin (DILANTIN) 125 MG/5ML suspension Take 8 mLs (200 mg total) by mouth 2 (two) times daily. 08/12/22  Yes Ennever, Rudell Cobb, MD  potassium chloride SA (KLOR-CON M) 20 MEQ tablet TAKE 2 TABLETS BY MOUTH TWICE DAILY 07/04/22  Yes Ennever, Rudell Cobb, MD  prochlorperazine (COMPAZINE) 10 MG tablet Take 1 tablet (10 mg total) by mouth every 6 (six) hours as needed for nausea or vomiting. Patient taking differently: Take 10 mg by mouth every 8 (eight) hours as needed for nausea or vomiting. 05/06/22  Yes Ennever, Rudell Cobb, MD  simvastatin (ZOCOR) 40 MG tablet Take 40 mg by mouth every evening.   Yes [provider]  sitaGLIPtin-metformin (JANUMET) 50-500 MG tablet Take 1 tablet by mouth 2 (two) times daily with a meal.   Yes [provider]  traMADol (ULTRAM) 50 MG tablet Take 1 tablet (50 mg total) by mouth every 6 (six) hours as needed. Patient taking differently: Take 50 mg by mouth every 6 (six) hours as needed for moderate pain. 08/06/22  Yes [name], MD   Allergies  Allergen Reactions   Rofecoxib Swelling   Review of Systems Planes of generalized pain, especially pain in her back. Physical Exam Ill-appearing lady sitting up in bed Diminished breath  sounds Has lower extremity edema Awake alert Complains of pain Dry oral mucosa  Vital Signs: BP (!) 147/74 (BP Location: Left Arm)   Pulse 91   Temp 98.4 F (36.9 C) (Oral)   Resp 18   Ht 5'  2" (1.575 m)   Wt 78.1 kg   SpO2 96%   BMI 31.49 kg/m  Pain Scale: 0-10   Pain Score: 7    SpO2: SpO2: 96 % O2 Device:SpO2: 96 % O2 Flow Rate: .O2 Flow Rate (L/min): 2 L/min  IO: Intake/output summary:  Intake/Output Summary (Last 24 hours) at 08/15/2022 1201 Last data filed at 08/15/2022 0555 Gross per 24 hour  Intake 1472.51 ml  Output 550 ml  Net 922.51 ml    LBM: Last BM Date : 08/14/22 (per pt.) Baseline Weight: Weight: 78.1 kg Most recent weight: Weight: 78.1 kg     Palliative Assessment/Data:   Palliative performance scale 50%.  Time In: 11 Time Out: 12 Time Total: 60 Greater than 50%  of this time was spent counseling and coordinating care related to the above assessment and plan.  Signed by: Loistine Chance, MD   Please contact Palliative Medicine Team phone at [phone] for questions and concerns.  For individual provider: See Shea Evans

## 2022-08-15 NOTE — Plan of Care (Signed)
  Problem: Nutritional: Goal: Maintenance of adequate nutrition will improve Outcome: Progressing   Problem: Clinical Measurements: Goal: Will remain free from infection Outcome: Progressing   Problem: Nutrition: Goal: Adequate nutrition will be maintained Outcome: Progressing

## 2022-08-15 NOTE — Consult Note (Signed)
Pamela [name] is well-known to me.  Pamela [name] is a very nice 61 year old white female.  Pamela [name] has progressive small cell lung cancer.  Pamela [name] recent was hospitalized with CNS metastasis.  Pamela [name] underwent radiation therapy.  Pamela [name] had seizures.  Pamela [name] improved with the radiation.  Pamela [name] went home.  Apparently, Pamela [name] had problems yesterday.  I think Pamela [name] family called [name] saying that Pamela [name] was not that responsive.  We had to go to the emergency room.  Pamela [name] subsequently was admitted.  Pamela [name] had a CT angiogram of the chest.  This did not show any pulmonary embolism.  Pamela [name] had think Pamela [name] may have had pneumonia.  Pamela [name] clearly has progressive disease.  Pamela [name] has progressive disease and Pamela [name] liver also.  Pamela [name] actually may have a little bit of better speech.  When Pamela [name] was in the hospital recently, Pamela [name] had slurred speech.  I think Pamela [name] may have had some difficulty swallowing.  Pamela [name] had lab work on admission.  Pamela [name] bilirubin was 2.2.  SGPT was 82 SGOT 111.  Pamela [name] albumin was 2.5.  Pamela [name] calcium was 8.3.  Blood sugar was 153.  The white cell count was 10.7.  Hemoglobin 9.4.  Platelet count 121.  At this point, I think that we really have to think about comfort care for Pamela [name].  I just do not think that Pamela [name] is strong enough to be able to handle any type of systemic chemotherapy.  Pamela [name] has had 2 different courses of chemotherapy.  Pamela [name] has progressed.  I think a third line of treatment would have probably less than 20% chance of helping Pamela [name].  Pamela [name] says Pamela [name] back is hurting Pamela [name].  I suspect this probably is secondary to malignancy.  It may be from adenopathy.  It may be from Pamela [name] liver.  I am not sure how much Pamela [name] really is eating.  And we will have to check a prealbumin on Pamela [name].  Pamela [name] is not having any bowel or bladder incontinence.  There is no cough.  Pamela [name] said there is no obvious shortness of breath.  Pamela [name] does have some confusion.  Pamela [name] is not sure where Pamela [name] is.  Pamela [name] is not sure as Pamela [name] got to the hospital.  I would have said that at the present home, Pamela [name]  performance status is probably ECOG 3.   On Pamela [name] physical exam, Pamela [name] temperature is 98.4.  Pulse 91.  Blood pressure 147/74.  Pamela [name] head neck exam shows no ocular or oral lesions.  Pamela [name] has no mucositis.  There is no thrush.  Pamela [name] has no adenopathy in the neck.  Lungs are clear bilaterally.  Pamela [name] may have some congestion and some crackles over in the left side.  Cardiac exam is regular rate and rhythm.  Pamela [name] has a 1/6 systolic ejection murmur.  Abdomen is soft.  Bowel sounds are decreased.  There is no obvious fluid wave.  There is no obvious hepatomegaly.  Extremities shows some trace edema in Pamela [name] legs bilaterally.  Neurological exam shows no focal neurological deficits.   Pamela [name] is a very nice 61 year old white female.  Unfortunately, Pamela [name] small cell lung cancer continues to progress.  Pamela [name] think, is at the point where we just are not able to treat Pamela [name].  I did not show much Pamela [name] really understands as well the same.  I will have to talk to Pamela [name] family about hospice.  I really think that Hospice would be appropriate for Pamela [name].  In all honesty, I really think that Pamela [name] prognosis is easily less  than 1 month.  Pamela [name] disease is progressing quickly.  When we first saw Pamela [name] back in February, Pamela [name] liver was about to fail.  We thankfully were able to treat Pamela [name] and improve Pamela [name] status.  As is typical for small cell lung cancer, it is now coming back quickly in the liver.  Pamela [name] liver function studies are somewhat increased.  It is Pamela [name] albumin that is dropping which is no surprise to me.  Again, I really think that Hospice is the way to go.  I think if Pamela [name] needs to go to residential Hospice I would be in favor of this.  I just think that would be hard to take care of Pamela [name] at home.  At least, the radiation did seem to help Pamela [name] a little bit.  I am glad that Pamela [name] is not having seizure issues.  We will work on Pamela [name] comfort.  I know that Pamela [name] will get wonderful care from the incredible staff on [address].    [name], MD  2 Timothy  4:6-8

## 2022-08-15 NOTE — Hospital Course (Signed)
61 y.o. 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  She was recently admitted to the ICU and intubated due to status epilepticus.  She has not been able to take her medications as usual due to difficulty swallowing.

## 2022-08-15 NOTE — Progress Notes (Signed)
  Progress Note   Patient: Pamela [name]:[id number] DOB: 1960-09-21 DOA: 08/14/2022     1 DOS: the patient was seen and examined on 08/15/2022   Brief hospital course: 61 y.o. 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  She was recently admitted to the ICU and intubated due to status epilepticus.  She has not been able to take her medications as usual due to difficulty swallowing.   Assessment and Plan: Principal Problem:   Intracerebral bleed ([place]) Secondary to:   SCLC (small cell lung carcinoma) ([place]) Recently noted to have worsening brain metastasis Reportedly had not been taking dexamethasone. Currently continued on dexamethasone parentally. Cont seizure meds as tolerated Neurochecks every 4 hours. Overall prognosis is very poor. F/u on goals of care. Oncology following   Active Problems:   Elevated liver enzymes With hyperammonemia Secondary to extensive liver mets. LFT's stable Ammonia 37     Acquired hypothyroidism Cont thyroid replacement     Chronic anemia/thrombocytopenia Monitor CBC daily.     Essential hypertension Hold antihypertensives for now. Monitor blood pressure.     Type 2 diabetes mellitus without complication,    without long-term current use of insulin (HCC) However, on high-dose dexamethasone. CBG monitoring with RI SS. More alert this AM, tolerated diet previous admit. Will allow PO as tolerated     Hyponatremia Mild hyperglycemia and poor oral intake. Follow sodium level. Improved     Abnormal urinalysis There is no fever or suprapubic tenderness. Questionable airspace disease earlier as well. Procal 0.53. Not toxic appearing. Possible may be elevated in setting of  diffuse malignancy       Subjective: Complaining of back pains  Physical Exam: Vitals:   08/15/22 0540 08/15/22 0735 08/15/22 0750 08/15/22 1152  BP:  (!) 141/68 136/88 (!) 147/74  Pulse:  88 88 91  Resp:      Temp:  97.7 F (36.5 C) 97.7 F (36.5 C) 98.4 F (36.9 C)  TempSrc:  Oral Oral Oral  SpO2:  96%    Weight: 78.1 kg     Height: 5\' 2"  (1.575 m)      General exam: Awake, laying in bed, in nad Respiratory system: Normal respiratory effort, no wheezing Cardiovascular system: regular rate, s1, s2 Gastrointestinal system: Soft, nondistended, positive BS Central nervous system: CN2-12 grossly intact, strength intact Extremities: Perfused, no clubbing Skin: Normal skin turgor, no notable skin lesions seen Psychiatry: Mood normal // no visual hallucinations   Data Reviewed:  Labs reviewed: Na 134, K 3.7, Cr 0.71, Hgb 8.5  Family Communication: Pt in room, family at bedside  Disposition: Status is: Inpatient Remains inpatient appropriate because: Severity of illness  Planned Discharge Destination: Home    Author: Marylu Lund, MD 08/15/2022 2:21 PM  For on call review [email or web address].

## 2022-08-15 NOTE — TOC Initial Note (Signed)
Transition of Care (TOC) - Initial/Assessment Note    Patient Details  Name: Pamela [name] MRN: [id number] Date of Birth: 23-Oct-1960  Transition of Care [facility]) CM/SW Contact:    [name], RN Phone Number: 08/15/2022, 2:29 PM  Clinical Narrative:  Noted Hospice-palliative care team following-await referral.                 Expected Discharge Plan:  (TBD) Barriers to Discharge: Continued Medical Work up   Patient Goals and CMS Choice        Expected Discharge Plan and Services Expected Discharge Plan:  (TBD)                                              Prior Living Arrangements/Services                       Activities of Daily Living Home Assistive Devices/Equipment: Environmental consultant (specify type) ADL Screening (condition at time of admission) Patient's cognitive ability adequate to safely complete daily activities?: No Is the patient deaf or have difficulty hearing?: No Does the patient have difficulty seeing, even when wearing glasses/contacts?: No Does the patient have difficulty concentrating, remembering, or making decisions?: Yes Patient able to express need for assistance with ADLs?: No Does the patient have difficulty dressing or bathing?: Yes Independently performs ADLs?: No Communication: Independent Dressing (OT): Needs assistance Is this a change from baseline?: Change from baseline, expected to last >3 days Grooming: Dependent Is this a change from baseline?: Change from baseline, expected to last >3 days Feeding: Dependent Is this a change from baseline?: Change from baseline, expected to last >3 days Bathing: Dependent Is this a change from baseline?: Change from baseline, expected to last >3 days Toileting: Dependent Is this a change from baseline?: Change from baseline, expected to last >3days In/Out Bed: Dependent Is this a change from baseline?: Change from baseline, expected to last >3 days Walks in Home: Needs assistance Is  this a change from baseline?: Change from baseline, expected to last <3 days Does the patient have difficulty walking or climbing stairs?: Yes Weakness of Legs: Both Weakness of Arms/Hands: Both  Permission Sought/Granted                  Emotional Assessment              Admission diagnosis:  Respiratory distress [R06.03] Intracerebral bleed ([place]) [I61.9] Urinary tract infection without hematuria, site unspecified [N39.0] Pneumonia due to infectious organism, unspecified laterality, unspecified part of lung [J18.9] Metastatic malignant neoplasm, unspecified site [facility]) [C79.9] Patient Active Problem List   Diagnosis Date Noted   Intracerebral bleed ([place]) 08/14/2022   Abnormal urinalysis 08/14/2022   Thrombocytopenia ([place]) 08/14/2022   Hyperammonemia ([place]) 08/14/2022   Status epilepticus ([place]) 07/16/2022   Acute hypoxemic respiratory failure ([place])    Hyperglycemia    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    SCLC (small cell lung carcinoma) ([place])    Metabolic alkalosis 46/65/9935   Pleural effusion on left 11/16/2021   Physical deconditioning 11/15/2021   Pancytopenia ([place]) 11/14/2021   Hypokalemia and hypomagnesemia 11/14/2021   Bilateral lower extremity edema with blisters/bulla 11/14/2021   Pneumonia 11/14/2021   AKI (acute kidney injury) ([place]) 11/03/2021   Hyperuricemia concern/risk for tumor lysis syndrome  11/03/2021   Elevated liver enzymes 11/03/2021   Hyponatremia 11/03/2021  Small cell lung cancer ([place]) 11/02/2021   Chronic anemia 03/26/2018   Acquired hypothyroidism 12/17/2017   Postmenopausal 12/17/2017   Primary osteoarthritis involving multiple joints 09/21/2017   Trochanteric bursitis of left hip 09/21/2017   Essential hypertension 06/17/2016   Type 2 diabetes mellitus without complication, without long-term current use of insulin ([place]) 06/17/2016   PCP:  Marinda Elk, MD Pharmacy:   [facility] [phone] - [address] [place] [address] Phone: [phone] Fax: [phone]     Social Determinants of Health ([place]) Interventions    Readmission Risk Interventions    07/27/2022   12:05 PM 07/27/2022   11:50 AM 11/06/2021    1:50 PM  Readmission Risk Prevention Plan  Transportation Screening Complete Complete Complete  PCP or Specialist Appt within 3-5 Days Complete Complete Complete  HRI or Home Care Consult Complete Complete Complete  Social Work Consult for [place] Planning/Counseling Complete Complete Complete  Palliative Care Screening Not Applicable Not Applicable Not Applicable  Medication Review Press photographer) Complete Complete Complete

## 2022-08-16 DIAGNOSIS — Z7189 Other specified counseling: Secondary | ICD-10-CM

## 2022-08-16 DIAGNOSIS — J189 Pneumonia, unspecified organism: Secondary | ICD-10-CM | POA: Diagnosis not present

## 2022-08-16 DIAGNOSIS — C799 Secondary malignant neoplasm of unspecified site: Secondary | ICD-10-CM | POA: Diagnosis not present

## 2022-08-16 DIAGNOSIS — C3490 Malignant neoplasm of unspecified part of unspecified bronchus or lung: Secondary | ICD-10-CM | POA: Diagnosis not present

## 2022-08-16 DIAGNOSIS — Z515 Encounter for palliative care: Secondary | ICD-10-CM

## 2022-08-16 LAB — URINE CULTURE: Culture: >=100000 — AB

## 2022-08-16 LAB — GLUCOSE, CAPILLARY
Glucose-Capillary: 119 mg/dL — ABNORMAL HIGH (ref 70–99)
Glucose-Capillary: 136 mg/dL — ABNORMAL HIGH (ref 70–99)
Glucose-Capillary: 152 mg/dL — ABNORMAL HIGH (ref 70–99)

## 2022-08-16 LAB — PHENYTOIN LEVEL, FREE AND TOTAL
Phenytoin, Free: 1.3 ug/mL (ref 1.0–2.0)
Phenytoin, Total: 9.9 ug/mL — ABNORMAL LOW (ref 10.0–20.0)

## 2022-08-16 MED ORDER — LORAZEPAM 0.5 MG PO TABS: 0.5000 mg | ORAL_TABLET | ORAL | Status: DC | PRN

## 2022-08-16 MED ORDER — LORAZEPAM 2 MG/ML IJ SOLN
0.5000 mg | INTRAMUSCULAR | Status: DC | PRN
  Administered 2022-08-16 – 2022-08-17 (×4): 0.5 mg via INTRAVENOUS
  Filled 2022-08-16 (×4): qty 1

## 2022-08-16 MED ORDER — LORAZEPAM 2 MG/ML PO CONC: 0.5000 mg | ORAL | Status: DC | PRN

## 2022-08-16 MED ORDER — HYDROMORPHONE HCL 1 MG/ML IJ SOLN
0.5000 mg | INTRAMUSCULAR | Status: DC | PRN
  Administered 2022-08-16 – 2022-08-17 (×7): 0.5 mg via INTRAVENOUS
  Filled 2022-08-16 (×8): qty 0.5

## 2022-08-16 NOTE — Progress Notes (Signed)
[name]) Hospital Liaison Note  Received request from [name], [facility] for family interest in the [place]. Spoke with patient's daughter, Pamela [name], to confirm interest and explain services. Eligibility confirmed per [facility] MD. Pamela [name] is agreeable to transfer tomorrow if bed is available.   TOC aware.  Thank you, Zigmund Gottron RN  [facility] Liaison [phone]

## 2022-08-16 NOTE — TOC Initial Note (Signed)
Transition of Care [facility]) - Initial/Assessment Note    Patient Details  Name: Pamela [name] MRN: [id number] Date of Birth: Apr 25, 1961  Transition of Care [facility]) CM/SW Contact:    [name], RN Phone Number: 08/16/2022, 1:26 PM  Clinical Narrative: Damaris Schooner to dtr in law [place] for Residential hospice-rep Fabio Pierce will contact [place], & do eval. Await outcome.                  Expected Discharge Plan: [place] Barriers to Discharge: Continued Medical Work up   Patient Goals and CMS Choice        Expected Discharge Plan and Services Expected Discharge Plan: [place]                                              Prior Living Arrangements/Services                       Activities of Daily Living Home Assistive Devices/Equipment: Environmental consultant (specify type) ADL Screening (condition at time of admission) Patient's cognitive ability adequate to safely complete daily activities?: No Is the patient deaf or have difficulty hearing?: No Does the patient have difficulty seeing, even when wearing glasses/contacts?: No Does the patient have difficulty concentrating, remembering, or making decisions?: Yes Patient able to express need for assistance with ADLs?: No Does the patient have difficulty dressing or bathing?: Yes Independently performs ADLs?: No Communication: Independent Dressing (OT): Needs assistance Is this a change from baseline?: Change from baseline, expected to last >3 days Grooming: Dependent Is this a change from baseline?: Change from baseline, expected to last >3 days Feeding: Dependent Is this a change from baseline?: Change from baseline, expected to last >3 days Bathing: Dependent Is this a change from baseline?: Change from baseline, expected to last >3 days Toileting: Dependent Is this a change from baseline?: Change from baseline, expected to last >3days In/Out Bed:  Dependent Is this a change from baseline?: Change from baseline, expected to last >3 days Walks in Home: Needs assistance Is this a change from baseline?: Change from baseline, expected to last <3 days Does the patient have difficulty walking or climbing stairs?: Yes Weakness of Legs: Both Weakness of Arms/Hands: Both  Permission Sought/Granted                  Emotional Assessment              Admission diagnosis:  Respiratory distress [R06.03] Intracerebral bleed ([place]) [I61.9] Urinary tract infection without hematuria, site unspecified [N39.0] Pneumonia due to infectious organism, unspecified laterality, unspecified part of lung [J18.9] Metastatic malignant neoplasm, unspecified site [facility]) [C79.9] Patient Active Problem List   Diagnosis Date Noted   Palliative care by specialist 08/16/2022   DNR (do not resuscitate) discussion 08/16/2022   Metastatic malignant neoplasm ([place]) 08/15/2022   Intracerebral bleed ([place]) 08/14/2022   Abnormal urinalysis 08/14/2022   Thrombocytopenia ([place]) 08/14/2022   Hyperammonemia ([place]) 08/14/2022   Status epilepticus ([place]) 07/16/2022   Acute hypoxemic respiratory failure ([place])    Hyperglycemia    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    SCLC (small cell lung carcinoma) ([place])    Metabolic alkalosis 69/67/8938   Pleural effusion on left 11/16/2021   Physical deconditioning 11/15/2021   Pancytopenia ([place]) 11/14/2021   Hypokalemia and hypomagnesemia 11/14/2021  Bilateral lower extremity edema with blisters/bulla 11/14/2021   Pneumonia 11/14/2021   Goals of care, counseling/discussion 11/03/2021   AKI (acute kidney injury) ([place]) 11/03/2021   Hyperuricemia concern/risk for tumor lysis syndrome  11/03/2021   Elevated liver enzymes 11/03/2021   Hyponatremia 11/03/2021   Small cell lung cancer ([place]) 11/02/2021   Chronic anemia 03/26/2018   Acquired hypothyroidism 12/17/2017   Postmenopausal 12/17/2017   Primary osteoarthritis involving  multiple joints 09/21/2017   Trochanteric bursitis of left hip 09/21/2017   Essential hypertension 06/17/2016   Type 2 diabetes mellitus without complication, without long-term current use of insulin ([place]) 06/17/2016   PCP:  Marinda Elk, MD Pharmacy:   [facility] [phone] - [address] [place] [address] Phone: [phone] Fax: [phone]     Social Determinants of Health (SDOH) Interventions    Readmission Risk Interventions    07/27/2022   12:05 PM 07/27/2022   11:50 AM 11/06/2021    1:50 PM  Readmission Risk Prevention Plan  Transportation Screening Complete Complete Complete  PCP or Specialist Appt within 3-5 Days Complete Complete Complete  HRI or Home Care Consult Complete Complete Complete  Social Work Consult for [place] Planning/Counseling Complete Complete Complete  Palliative Care Screening Not Applicable Not Applicable Not Applicable  Medication Review Press photographer) Complete Complete Complete

## 2022-08-16 NOTE — Progress Notes (Signed)
Daily Progress Note   Patient Name: Pamela [name]       Date: 08/16/2022 DOB: Sep 24, 1960  Age: 61 y.o. MRN#: [id number] Attending Physician: Donne Hazel, MD Primary Care Physician: Marinda Elk, MD Admit Date: 08/14/2022  Reason for Consultation/Follow-up: Establishing goals of care, Non pain symptom management, and Pain control  Subjective: Weak but restless, appears in generalized distress.  Chart reviewed.  Overnight events reviewed.  MRI results reviewed.  Goals of care discussions undertaken and CODE STATUS discussions also undertaken see below.  Length of Stay: 2  Current Medications: Scheduled Meds:   Chlorhexidine Gluconate Cloth  6 each Topical Daily   dexamethasone (DECADRON) injection  4 mg Intravenous Q12H   insulin aspart  0-15 Units Subcutaneous Q6H   levothyroxine  125 mcg Oral Q0600   phenytoin  200 mg Oral BID    Continuous Infusions:  lacosamide (VIMPAT) IV Stopped (08/15/22 2223)   levETIRAcetam Stopped (08/16/22 0527)    PRN Meds: acetaminophen **OR** acetaminophen, albuterol, morphine CONCENTRATE, ondansetron **OR** ondansetron (ZOFRAN) IV  Physical Exam         Weak appearing lady Restless Patient appears with generalized discomfort Shallow regular breath sounds Trace edema to legs bilaterally Abdomen is not distended Does appear slightly lethargic. Vital Signs: BP 133/73 (BP Location: Left Arm)   Pulse 96   Temp 97.7 F (36.5 C) (Oral)   Resp 18   Ht 5\' 2"  (1.575 m)   Wt 78.1 kg   SpO2 95%   BMI 31.49 kg/m  SpO2: SpO2: 95 % O2 Device: O2 Device: Nasal Cannula O2 Flow Rate: O2 Flow Rate (L/min): 2 L/min  Intake/output summary:  Intake/Output Summary (Last 24 hours) at 08/16/2022 0923 Last data filed at 08/16/2022 1287 Gross per 24  hour  Intake 767 ml  Output --  Net 767 ml   LBM: Last BM Date : 08/14/22 Baseline Weight: Weight: 78.1 kg Most recent weight: Weight: 78.1 kg       Palliative Assessment/Data:    Palliative performance scale 30%.  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    SCLC (small cell lung carcinoma) (HCC)    Metabolic alkalosis 89/37/3428   Pleural effusion on left 11/16/2021   Physical deconditioning 11/15/2021   Pancytopenia ([place]) 11/14/2021   Hypokalemia and hypomagnesemia 11/14/2021   Bilateral lower extremity edema with blisters/bulla 11/14/2021   Pneumonia 11/14/2021   AKI (acute kidney injury) ([place]) 11/03/2021   Hyperuricemia concern/risk for tumor lysis syndrome  11/03/2021   Elevated liver enzymes 11/03/2021   Hyponatremia 11/03/2021   Small cell lung cancer ([place]) 11/02/2021   Chronic anemia 03/26/2018   Acquired hypothyroidism 12/17/2017   Postmenopausal 12/17/2017   Primary osteoarthritis involving multiple joints 09/21/2017   Trochanteric bursitis of left hip 09/21/2017   Essential hypertension 06/17/2016   Type 2 diabetes mellitus without complication, without long-term current use of insulin ([place]) 06/17/2016    Palliative Care Assessment & Plan   Patient Profile:    Assessment: 61 year old lady with a life limiting illness of progressive small cell lung cancer, palliative medicine initial consult on 08-15-2022.  Patient underwent MRI of the brain.  Patient is being closely followed by her medical oncologist Dr. Marin Olp.  Recommendations/Plan: CODE STATUS and goals of care discussions undertaken.  I followed up to the  conversation that Dr. Marin Olp has already had with the family early this morning.  We discussed about appropriateness of DNR, we discussed about appropriateness of a symptom focused mode of care and the appropriateness of hospice philosophy of care specifically residential hospice.  Discussions today regarding the type of care that is provided inside a residential hospice setting where held.  All of the patient's and family's questions answered to the best of my ability.  Various members of the interdisciplinary team contacted see below.    Code Status:    Code Status Orders  (From admission, onward)           Start     Ordered   08/16/22 0922  Do not attempt resuscitation (DNR)  Continuous       Question Answer Comment  In the event of cardiac or respiratory ARREST Do not call a "code blue"   In the event of cardiac or respiratory ARREST Do not perform Intubation, CPR, defibrillation or ACLS   In the event of cardiac or respiratory ARREST Use medication by any route, position, wound care, and other measures to relive pain and suffering. May use oxygen, suction and manual treatment of airway obstruction as needed for comfort.      08/16/22 0921           Code Status History     Date Active Date Inactive Code Status Order ID Comments User Context   08/14/2022 1712 08/16/2022 0921 Full Code [id number]  Reubin Milan, MD ED   07/16/2022 0508 08/02/2022 1858 Full Code [id number]  Rhae Lerner Inpatient   11/03/2021 1432 11/21/2021 2227 Full Code [id number]  Orma Flaming, MD ED      Advance Directive Documentation    Flowsheet Row Most Recent Value  Type of Advance Directive Living will  Pre-existing out of facility DNR order (yellow form or pink MOST form) --  "MOST" Form in Place? --       Prognosis:  < 2 weeks  Discharge Planning: Hospice facility  Care plan was discussed with patient, daughter-in-law Pamela [name] present at bedside.  Also discussed with  various members of the interdisciplinary team such as medical oncology, hospital  medicine and transitions of care specialist.  Also discussed during interdisciplinary rounds within the palliative medicine team on 08-16-2022.  Thank you for allowing the Palliative Medicine Team to assist in the care of this patient.  High MDM.  Greater than 50%  of this time was spent counseling and coordinating care related to the above assessment and plan.  Loistine Chance, MD  Please contact Palliative Medicine Team phone at [phone] for questions and concerns.

## 2022-08-16 NOTE — Progress Notes (Addendum)
  Progress Note   Patient: Pamela [name] DSK:[id number] DOB: 1961-02-16 DOA: 08/14/2022     2 DOS: the patient was seen and examined on 08/16/2022   Brief hospital course: 61 y.o. 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  She was recently admitted to the ICU and intubated due to status epilepticus.  She has not been able to take her medications as usual due to difficulty swallowing.   Assessment and Plan: Principal Problem:   Intracerebral bleed ([place]) Secondary to:   SCLC (small cell lung carcinoma) ([place]) Recently noted to have worsening brain metastasis Reportedly had not been taking dexamethasone. Pt had been on dexamethasone parentally. Cont seizure meds as tolerated Overall prognosis is very poor. Appreciate Oncology assistance. Wishes now noted to be DNR and comfort care -Pending residential hospice   Active Problems:   Elevated liver enzymes With hyperammonemia Secondary to extensive liver mets. LFT's had been stable -now comfort measures only     Acquired hypothyroidism Later transitioned to comfort measures      Chronic anemia/thrombocytopenia Monitor CBC daily.     Essential hypertension Held antihypertensives at presentation Later focus on comfort only     Type 2 diabetes mellitus without complication,    without long-term current use of insulin ([place]) Had been on high-dose dexamethasone. Later transitioned to comfort measures     Hyponatremia Mild hyperglycemia and poor oral intake. Follow sodium level. Improved     Abnormal urinalysis There is no fever or suprapubic tenderness. Later transitioned to comfort measures       Subjective: states back pain improved with analgesia  Physical  Exam: Vitals:   08/15/22 2121 08/15/22 2331 08/16/22 0354 08/16/22 0939  BP: 129/61  133/73 (!) 149/76  Pulse: 81  96 93  Resp: 14 17 18  (!) 23  Temp: 98.2 F (36.8 C)  97.7 F (36.5 C) (!) 97.5 F (36.4 C)  TempSrc: Oral  Oral Axillary  SpO2: 96%  95% 97%  Weight:      Height:       General exam: Conversant, in no acute distress Respiratory system: normal chest rise, clear, no audible wheezing Cardiovascular system: regular rhythm, s1-s2 Gastrointestinal system: Nondistended, nontender, pos BS Central nervous system: No seizures, no tremors Extremities: No cyanosis, no joint deformities Skin: No rashes, no pallor Psychiatry: Affect normal // no auditory hallucinations   Data Reviewed:  There are no new results to review at this time.  Family Communication: Pt in room, family at bedside  Disposition: Status is: Inpatient Remains inpatient appropriate because: Severity of illness  Planned Discharge Destination:  residential hospice    Author: Marylu Lund, MD 08/16/2022 3:37 PM  For on call review [email or web address].

## 2022-08-16 NOTE — Progress Notes (Signed)
Overall, Pamela [name] is really about the same.  She is quite fatigued.  I had a long talk with her daughter last night.  We are all in agreement that she needs to be with Hospice.  The family would like for her to go to the [facility] in [place].  I told her that the problem is that for right now, Pamela [name] is a full code.  The family does not wish to have this.  The family feels that Pamela [name] would not want to be kept alive on machines.  However, they will make that final decision today.  If Pamela [name] is made a DNR, then I would see about moving into the Hospice Home.  She says her back pain is a little bit better.  We have her on morphine elixir.  She is not eating much.  I told her that I am not surprised by this.  So far, there are no labs back.  She has had no obvious fever.  There is been no headache.  I am wondering if she may not be able to have some of her seizure medicines adjusted.  Her MRI of the brain actually looked better.  As such, maybe she does not need 3 seizure medications.  I will know if Neurology can combine a seizure and see if they feel it appropriate to decrease her seizure medications.  Our goal at this point, is comfort care.  I would like to see what her labs are.  Again, her daughter will get back with [name] today about the family's wishes with respect to resuscitation status.  Her vital signs are temperature 97.7.  Pulse 96.  Blood pressure 133/73.  Her lungs still sound relatively good bilaterally.  There may be some slight congestion over on the left side.  Cardiac exam tachycardic but regular.  There may be an occasional extra beat.  Abdomen is soft.  Bowel sounds are active.  There is no guarding or rebound tenderness.  Liver edge might be at the right costal margin.  Extremities shows some trace edema in the legs bilaterally.  Neurological exam shows some slight lethargy.  Again, Pamela [name] has the progressive small cell lung cancer.  She is not a candidate for any  additional chemotherapy.  I think the risk of chemotherapy far outweighs the benefit at this point in time.  If she is a true DNR, then we can see about getting her over to the Hospice house home in [place].  I know that she is getting wonderful care from everybody on [address].  [name], MD  Vonna Kotyk 1:9

## 2022-08-17 DIAGNOSIS — J189 Pneumonia, unspecified organism: Secondary | ICD-10-CM | POA: Diagnosis not present

## 2022-08-17 DIAGNOSIS — Z7189 Other specified counseling: Secondary | ICD-10-CM | POA: Diagnosis not present

## 2022-08-17 DIAGNOSIS — Z515 Encounter for palliative care: Secondary | ICD-10-CM | POA: Diagnosis not present

## 2022-08-17 DIAGNOSIS — C3490 Malignant neoplasm of unspecified part of unspecified bronchus or lung: Secondary | ICD-10-CM | POA: Diagnosis not present

## 2022-08-17 DIAGNOSIS — C799 Secondary malignant neoplasm of unspecified site: Secondary | ICD-10-CM | POA: Diagnosis not present

## 2022-08-17 MED ORDER — LEVETIRACETAM IN NACL 1500 MG/100ML IV SOLN: 1500.0000 mg | Freq: Two times a day (BID) | INTRAVENOUS | Status: AC

## 2022-08-17 MED ORDER — ONDANSETRON HCL 4 MG PO TABS: 4.0000 mg | ORAL_TABLET | Freq: Four times a day (QID) | ORAL | 0 refills | Status: AC | PRN

## 2022-08-17 MED ORDER — ALBUTEROL SULFATE (2.5 MG/3ML) 0.083% IN NEBU: 2.5000 mg | INHALATION_SOLUTION | RESPIRATORY_TRACT | 12 refills | Status: AC | PRN

## 2022-08-17 MED ORDER — LORAZEPAM 2 MG/ML IJ SOLN: 0.5000 mg | INTRAMUSCULAR | 0 refills | Status: AC | PRN

## 2022-08-17 MED ORDER — DEXAMETHASONE SODIUM PHOSPHATE 4 MG/ML IJ SOLN: 4.0000 mg | Freq: Two times a day (BID) | INTRAMUSCULAR | Status: AC

## 2022-08-17 MED ORDER — SODIUM CHLORIDE 0.9 % IV SOLN: 200.0000 mg | Freq: Two times a day (BID) | INTRAVENOUS | Status: AC

## 2022-08-17 MED ORDER — MORPHINE SULFATE (CONCENTRATE) 10 MG/0.5ML PO SOLN: 10.0000 mg | ORAL | 0 refills | Status: AC | PRN

## 2022-08-17 NOTE — Progress Notes (Signed)
Daily Progress Note   Patient Name: Pamela [name]       Date: 08/17/2022 DOB: 14-Jun-1961  Age: 61 y.o. MRN#: [id number] Attending Physician: Donne Hazel, MD Primary Care Physician: Marinda Elk, MD Admit Date: 08/14/2022  Reason for Consultation/Follow-up: Establishing goals of care, Non pain symptom management, and Pain control  Subjective: Restless Confused Spouse at bedside    Length of Stay: 3  Current Medications: Scheduled Meds:   Chlorhexidine Gluconate Cloth  6 each Topical Daily   dexamethasone (DECADRON) injection  4 mg Intravenous Q12H   levothyroxine  125 mcg Oral Q0600   phenytoin  200 mg Oral BID    Continuous Infusions:  lacosamide (VIMPAT) IV 200 mg (08/16/22 2146)   levETIRAcetam 1,500 mg (08/17/22 0532)    PRN Meds: acetaminophen **OR** acetaminophen, albuterol, HYDROmorphone (DILAUDID) injection, LORazepam, morphine CONCENTRATE, ondansetron **OR** ondansetron (ZOFRAN) IV  Physical Exam         Weak appearing lady Restless Patient appears with generalized discomfort Shallow regular breath sounds Trace edema to legs bilaterally Abdomen is not distended Confused  Vital Signs: BP 125/71 (BP Location: Left Arm)   Pulse 93   Temp 97.8 F (36.6 C)   Resp 16   Ht 5\' 2"  (1.575 m)   Wt 78.1 kg   SpO2 97%   BMI 31.49 kg/m  SpO2: SpO2: 97 % O2 Device: O2 Device: Nasal Cannula O2 Flow Rate: O2 Flow Rate (L/min): 2 L/min  Intake/output summary:  Intake/Output Summary (Last 24 hours) at 08/17/2022 0921 Last data filed at 08/17/2022 0549 Gross per 24 hour  Intake --  Output 375 ml  Net -375 ml    LBM: Last BM Date : 08/14/22 Baseline Weight: Weight: 78.1 kg Most recent weight: Weight: 78.1 kg       Palliative  Assessment/Data:    Palliative performance scale 30%.  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  SCLC (small cell lung carcinoma) (HCC)    Metabolic alkalosis 92/44/6286   Pleural effusion on left 11/16/2021   Physical deconditioning 11/15/2021   Pancytopenia ([place]) 11/14/2021   Hypokalemia and hypomagnesemia 11/14/2021   Bilateral lower extremity edema with blisters/bulla 11/14/2021   Pneumonia 11/14/2021   Goals of care, counseling/discussion 11/03/2021   AKI (acute kidney injury) ([place]) 11/03/2021   Hyperuricemia concern/risk for tumor lysis syndrome  11/03/2021   Elevated liver enzymes 11/03/2021   Hyponatremia 11/03/2021   Small cell lung cancer ([place]) 11/02/2021   Chronic anemia 03/26/2018   Acquired hypothyroidism 12/17/2017   Postmenopausal 12/17/2017   Primary osteoarthritis involving multiple joints 09/21/2017   Trochanteric bursitis of left hip 09/21/2017   Essential hypertension 06/17/2016   Type 2 diabetes mellitus without complication, without long-term current use of insulin ([place]) 06/17/2016    Palliative Care Assessment & Plan   Patient Profile:    Assessment: 61 year old lady with a life limiting illness of progressive small cell lung cancer, palliative medicine initial consult on 08-15-2022.  Patient underwent MRI of the brain.  Patient is being closely followed by her medical oncologist Dr. Marin Olp.  Recommendations/Plan: DNR/DNI Comfort measures Transfer to residential hospice     Code Status:    Code Status  Orders  (From admission, onward)           Start     Ordered   08/16/22 0922  Do not attempt resuscitation (DNR)  Continuous       Question Answer Comment  In the event of cardiac or respiratory ARREST Do not call a "code blue"   In the event of cardiac or respiratory ARREST Do not perform Intubation, CPR, defibrillation or ACLS   In the event of cardiac or respiratory ARREST Use medication by any route, position, wound care, and other measures to relive pain and suffering. May use oxygen, suction and manual treatment of airway obstruction as needed for comfort.      08/16/22 0921           Code Status History     Date Active Date Inactive Code Status Order ID Comments User Context   08/14/2022 1712 08/16/2022 0921 Full Code [id number]  Reubin Milan, MD ED   07/16/2022 0508 08/02/2022 1858 Full Code [id number]  Rhae Lerner Inpatient   11/03/2021 1432 11/21/2021 2227 Full Code [id number]  Orma Flaming, MD ED      Advance Directive Documentation    Flowsheet Row Most Recent Value  Type of Advance Directive Living will  Pre-existing out of facility DNR order (yellow form or pink MOST form) --  "MOST" Form in Place? --       Prognosis:  < 2 weeks  Discharge Planning: Hospice facility  Care plan was discussed with patient and spouse  Thank you for allowing the Palliative Medicine Team to assist in the care of this patient.  low MDM.  Greater than 50%  of this time was spent counseling and coordinating care related to the above assessment and plan.  Loistine Chance, MD  Please contact Palliative Medicine Team phone at [phone] for questions and concerns.

## 2022-08-17 NOTE — Progress Notes (Signed)
Called hospice facility where pt is being transported. Spoke with Annabell Sabal, who will be receiving pt. Informed her of last VS, Lorazepam 0.5 mg IV administered @ 1344 & dilaudid 0.5 mg IV prior to leaving with EMS. Oral care provided as well. Pt left without event & comfortable.

## 2022-08-17 NOTE — Discharge Summary (Signed)
Physician Discharge Summary   Patient: Pamela [name] MRN: [id number] DOB: 1961/08/01  Admit date:     08/14/2022  Discharge date: 08/17/22  Discharge Physician: Marylu Lund   PCP: Marinda Elk, MD   Recommendations at discharge:    Follow up with hospice services Routine port and foley care  Discharge Diagnoses: Principal Problem:   Intracerebral bleed (Pamela [name]) Active Problems:   Elevated liver enzymes   Acquired hypothyroidism   Chronic anemia   Essential hypertension   Type 2 diabetes mellitus without complication, without long-term current use of insulin (HCC)   Goals of care, counseling/discussion   Hyponatremia   SCLC (small cell lung carcinoma) (HCC)   Abnormal urinalysis   Thrombocytopenia (HCC)   Hyperammonemia (HCC)   Metastatic malignant neoplasm ([place])   Palliative care by specialist   DNR (do not resuscitate) discussion  Resolved Problems:   * No resolved hospital problems. [facility] Course: 61 y.o. 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  She was recently admitted to the ICU and intubated due to status epilepticus.  She has not been able to take her medications as usual due to difficulty swallowing.   Assessment and Plan: Principal Problem:   Intracerebral bleed (Pamela [name]) Secondary to:   SCLC (small cell lung carcinoma) (Pamela [name]) Recently noted to have worsening brain metastasis Reportedly had not been taking dexamethasone. Pt had been on dexamethasone parentally. Patient was continued onseizure meds as tolerated Overall prognosis is very poor. Appreciate Oncology assistance. Wishes later noted to be DNR and comfort care -Now plans for transfer to residential hospice   Active  Problems:   Elevated liver enzymes With hyperammonemia Secondary to extensive liver mets. LFT's had been stable -now comfort measures only     Acquired hypothyroidism Later transitioned to comfort measures      Chronic anemia/thrombocytopenia Monitor CBC daily.     Essential hypertension Held antihypertensives at presentation Later focus on comfort only     Type 2 diabetes mellitus without complication,    without long-term current use of insulin (Pamela [name]) Had been on high-dose dexamethasone. Later transitioned to comfort measures     Hyponatremia Mild hyperglycemia and poor oral intake. Follow sodium level. Improved     Abnormal urinalysis There is no fever or suprapubic tenderness. Later transitioned to comfort measures        Consultants: Palliative Care, Oncology Procedures performed:   Disposition: Hospice care Diet recommendation:  Comfort feed as tolerated DISCHARGE MEDICATION: Allergies as of 08/17/2022       Reactions   Rofecoxib Swelling        Medication List     STOP taking these medications    albuterol 108 (90 Base) MCG/ACT inhaler Commonly known as: VENTOLIN HFA Replaced by: albuterol (2.5 MG/3ML) 0.083% nebulizer solution   dexamethasone 4 MG tablet Commonly known as: DECADRON   fentaNYL 12 MCG/HR Commonly known as: DURAGESIC   ibuprofen 200 MG tablet Commonly known as: ADVIL   lacosamide 200 MG Tabs tablet Commonly known as: VIMPAT   levETIRAcetam 750 MG tablet Commonly known as: KEPPRA   levothyroxine 125 MCG tablet Commonly known as: SYNTHROID   multivitamin tablet   naloxone 4 MG/0.1ML Liqd nasal spray kit Commonly known as: NARCAN   Oxycodone HCl 10 MG Tabs   pantoprazole 40  MG tablet Commonly known as: PROTONIX   potassium chloride SA 20 MEQ tablet Commonly known as: KLOR-CON M   prochlorperazine 10 MG tablet Commonly known as: COMPAZINE   simvastatin 40 MG tablet Commonly known as: ZOCOR    sitaGLIPtin-metformin 50-500 MG tablet Commonly known as: JANUMET   traMADol 50 MG tablet Commonly known as: ULTRAM       TAKE these medications    acetaminophen 500 MG tablet Commonly known as: TYLENOL Take 1,000 mg by mouth every 6 (six) hours as needed for headache or moderate pain.   albuterol (2.5 MG/3ML) 0.083% nebulizer solution Commonly known as: PROVENTIL Inhale 3 mLs (2.5 mg total) into the lungs every 4 (four) hours as needed for wheezing or shortness of breath. Replaces: albuterol 108 (90 Base) MCG/ACT inhaler   dexamethasone 4 MG/ML injection Commonly known as: DECADRON Inject 1 mL (4 mg total) into the vein every 12 (twelve) hours.   lacosamide 200 mg in sodium chloride 0.9 % 25 mL Inject 200 mg into the vein every 12 (twelve) hours.   levETIRacetam 1500 MG/100ML Soln Commonly known as: KEPPRA Inject 100 mLs (1,500 mg total) into the vein every 12 (twelve) hours.   lidocaine-prilocaine cream Commonly known as: EMLA Apply to affected area once What changed:  how much to take how to take this when to take this reasons to take this additional instructions   LORazepam 2 MG/ML injection Commonly known as: ATIVAN Inject 0.25 mLs (0.5 mg total) into the vein every 4 (four) hours as needed for anxiety.   morphine CONCENTRATE 10 MG/0.5ML Soln concentrated solution Take 0.5 mLs (10 mg total) by mouth every 2 (two) hours as needed for moderate pain or shortness of breath.   ondansetron 4 MG tablet Commonly known as: ZOFRAN Take 1 tablet (4 mg total) by mouth every 6 (six) hours as needed for nausea. What changed:  medication strength how much to take when to take this reasons to take this additional instructions   phenytoin 125 MG/5ML suspension Commonly known as: DILANTIN Take 8 mLs (200 mg total) by mouth 2 (two) times daily.        Follow-up Information     Follow up with hospice services Follow up.                 Discharge  Exam: Filed Weights   08/15/22 0540  Weight: 78.1 kg   General exam: Awake, laying in bed, in nad Respiratory system: Normal respiratory effort, no wheezing Cardiovascular system: regular rate, s1, s2 Gastrointestinal system: Soft, nondistended, positive BS Central nervous system: CN2-12 grossly intact, strength intact Extremities: Perfused, no clubbing Skin: Normal skin turgor, no notable skin lesions seen Psychiatry: Mood normal // no visual hallucinations   Condition at discharge: poor  The results of significant diagnostics from this hospitalization (including imaging, microbiology, ancillary and laboratory) are listed below for reference.   Imaging Studies: MR BRAIN W WO CONTRAST  Result Date: 08/15/2022 CLINICAL DATA:  Small cell lung cancer. Assess treatment response. Post chemo and head radiation. EXAM: MRI HEAD WITHOUT AND WITH CONTRAST TECHNIQUE: Multiplanar, multiecho pulse sequences of the brain and surrounding structures were obtained without and with intravenous contrast. CONTRAST:  17m GADAVIST GADOBUTROL 1 MMOL/ML IV SOLN COMPARISON:  CT head 08/14/2022.  MRI head 07/29/2022 FINDINGS: Brain: Multiple enhancing metastatic deposits in the brain show improvement from the prior MRI. In addition, there is decreased surrounding edema around multiple lesions. There is less mass effect on the right lateral ventricle  and resolution of the midline shift to the left seen previously. Several lesions no longer visualized. Routine brain protocol performed. Thin section imaging not performed. Index lesion right lateral frontal operculum now measures 12 mm, previously 26 mm. Index lesion right occipital lobe now measures 21 mm previously 30 mm with similar hemorrhage. Interval significant hemorrhage in the left posteromedial temporal lobe lesion which now measures 20 mm, similar size to the prior study due to interval hemorrhage. Left posterior frontal lesion is smaller but shows interval  hemorrhage. This lesion measures approximately 1 cm best seen on axial image 42 series 11 Ventricle size normal.  No acute infarct. Vascular: Normal arterial flow voids. Skull and upper cervical spine: No focal skeletal lesion. Sinuses/Orbits: Paranasal sinuses clear. Mastoid clear. Negative orbit Other: None IMPRESSION: 1. Multiple metastatic deposits show improvement in size and surrounding edema. Several lesions no longer visualized. 2. Interval hemorrhage in the left posteromedial temporal lobe lesion which now measures 20 mm. Interval hemorrhage left posterior temporal lesion which is smaller. Electronically Signed   By: Franchot Gallo M.D.   On: 08/15/2022 15:05   CT Angio Chest PE W and/or Wo Contrast  Result Date: 08/14/2022 CLINICAL DATA:  Generalized weakness, lethargy, and anorexia. Small cell lung cancer. Pulmonary embolism (PE) suspected, high prob; Abdominal pain, acute, nonlocalized EXAM: CT ANGIOGRAPHY CHEST CT ABDOMEN AND PELVIS WITH CONTRAST TECHNIQUE: Multidetector CT imaging of the chest was performed using the standard protocol during bolus administration of intravenous contrast. Multiplanar CT image reconstructions and MIPs were obtained to evaluate the vascular anatomy. Multidetector CT imaging of the abdomen and pelvis was performed using the standard protocol during bolus administration of intravenous contrast. RADIATION DOSE REDUCTION: This exam was performed according to the departmental dose-optimization program which includes automated exposure control, adjustment of the mA and/or kV according to patient size and/or use of iterative reconstruction technique. CONTRAST:  142m OMNIPAQUE IOHEXOL 350 MG/ML SOLN COMPARISON:  07/09/2022 FINDINGS: CTA CHEST FINDINGS Cardiovascular: Mild coronary artery calcification. Global cardiac size within normal limits. No pericardial effusion. Central pulmonary arteries are of normal caliber. No intraluminal filling defect is identified through the  segmental level to suggest acute pulmonary embolism. Mild atherosclerotic calcification within the thoracic aorta. No aortic aneurysm. Right internal jugular chest port tip seen within the low right atrium, unchanged. Mediastinum/Nodes: New cervical adenopathy identified at the left neck base with a supraclavicular lymph node measuring 10 mm in short axis diameter noted at axial image # 12/5. Progressive mediastinal adenopathy with new left internal mammary adenopathy and progressive mediastinal and left hilar adenopathy. Index lymph node measures 11 mm in short axis diameter within the pre-vascular lymph node group, previously measuring 7 mm. Progressive periaortic and paraesophageal adenopathy with the index lymph node measuring 21 mm in short axis diameter at axial image # 69/5 (previously measuring 16 mm). Interval development of pericardial adenopathy. The esophagus is unremarkable. Lungs/Pleura: Primary subpleural mass within the left lower lobe has enlarged now measuring 2.6 x 3.9 cm at axial image # 44/5. Multiple pleural nodules are identified in keeping with pleural metastatic disease. Small left pleural effusion has developed. Right lung is clear. No pneumothorax. No pleural effusion on the right. Musculoskeletal: No acute bone abnormality. No lytic or blastic bone lesion. Review of the MIP images confirms the above findings. CT ABDOMEN and PELVIS FINDINGS Hepatobiliary: Innumerable enlarging hepatic metastases are identified. Index lesion within the central right hepatic lobe measures 27 mm x 32 mm at axial image # 16/3 (previously measuringl 16  mm x 16 mm). Metastatic disease replaces roughly 50% of the hepatic parenchyma. Status post cholecystectomy. No intra or extrahepatic biliary ductal dilation. Main portal vein is patent. Pancreas: Unremarkable Spleen: Unremarkable Adrenals/Urinary Tract: The adrenal glands are unremarkable. Kidneys are normal. Bladder is unremarkable. Stomach/Bowel: Surgical  changes of right hemicolectomy are identified. Stomach, small bowel, and large bowel are otherwise unremarkable. No free intraperitoneal gas or fluid. Vascular/Lymphatic: Moderate aortoiliac atherosclerotic calcification is seen. No aortic aneurysm. There is development of pathologic periceliac, retrocrural, Peri renal, and aortocaval pathologic adenopathy. Index aortocaval lymph node measures 15 mm in short axis diameter at axial image # 41/3. Reproductive: Status post hysterectomy. No adnexal masses. Other: Multiple soft tissue nodules developed within the lower anterior abdominal wall bilaterally which may relate to subcutaneous injection. Tiny fat containing umbilical hernia. Musculoskeletal: Degenerative changes are seen within the lumbar spine. No lytic or blastic bone lesion. Review of the MIP images confirms the above findings. IMPRESSION: 1. No pulmonary embolism. No acute intrathoracic pathology identified. 2. Interval progression of disease with enlarging primary subpleural mass within the left lower lobe, progressive left pleural metastatic disease, progressive mediastinal, left hilar, left internal mammary and pericardial adenopathy, as well as innumerable enlarging hepatic metastases and development of pathologic intra-abdominal adenopathy. 3. Mild coronary artery calcification. 4. Status post right hemicolectomy. 5.  Aortic Atherosclerosis (ICD10-I70.0). Electronically Signed   By: Fidela Salisbury M.D.   On: 08/14/2022 16:26   CT ABDOMEN PELVIS W CONTRAST  Result Date: 08/14/2022 CLINICAL DATA:  Generalized weakness, lethargy, and anorexia. Small cell lung cancer. Pulmonary embolism (PE) suspected, high prob; Abdominal pain, acute, nonlocalized EXAM: CT ANGIOGRAPHY CHEST CT ABDOMEN AND PELVIS WITH CONTRAST TECHNIQUE: Multidetector CT imaging of the chest was performed using the standard protocol during bolus administration of intravenous contrast. Multiplanar CT image reconstructions and MIPs were  obtained to evaluate the vascular anatomy. Multidetector CT imaging of the abdomen and pelvis was performed using the standard protocol during bolus administration of intravenous contrast. RADIATION DOSE REDUCTION: This exam was performed according to the departmental dose-optimization program which includes automated exposure control, adjustment of the mA and/or kV according to patient size and/or use of iterative reconstruction technique. CONTRAST:  14m OMNIPAQUE IOHEXOL 350 MG/ML SOLN COMPARISON:  07/09/2022 FINDINGS: CTA CHEST FINDINGS Cardiovascular: Mild coronary artery calcification. Global cardiac size within normal limits. No pericardial effusion. Central pulmonary arteries are of normal caliber. No intraluminal filling defect is identified through the segmental level to suggest acute pulmonary embolism. Mild atherosclerotic calcification within the thoracic aorta. No aortic aneurysm. Right internal jugular chest port tip seen within the low right atrium, unchanged. Mediastinum/Nodes: New cervical adenopathy identified at the left neck base with a supraclavicular lymph node measuring 10 mm in short axis diameter noted at axial image # 12/5. Progressive mediastinal adenopathy with new left internal mammary adenopathy and progressive mediastinal and left hilar adenopathy. Index lymph node measures 11 mm in short axis diameter within the pre-vascular lymph node group, previously measuring 7 mm. Progressive periaortic and paraesophageal adenopathy with the index lymph node measuring 21 mm in short axis diameter at axial image # 69/5 (previously measuring 16 mm). Interval development of pericardial adenopathy. The esophagus is unremarkable. Lungs/Pleura: Primary subpleural mass within the left lower lobe has enlarged now measuring 2.6 x 3.9 cm at axial image # 44/5. Multiple pleural nodules are identified in keeping with pleural metastatic disease. Small left pleural effusion has developed. Right lung is  clear. No pneumothorax. No pleural effusion on the right.  Musculoskeletal: No acute bone abnormality. No lytic or blastic bone lesion. Review of the MIP images confirms the above findings. CT ABDOMEN and PELVIS FINDINGS Hepatobiliary: Innumerable enlarging hepatic metastases are identified. Index lesion within the central right hepatic lobe measures 27 mm x 32 mm at axial image # 16/3 (previously measuringl 16 mm x 16 mm). Metastatic disease replaces roughly 50% of the hepatic parenchyma. Status post cholecystectomy. No intra or extrahepatic biliary ductal dilation. Main portal vein is patent. Pancreas: Unremarkable Spleen: Unremarkable Adrenals/Urinary Tract: The adrenal glands are unremarkable. Kidneys are normal. Bladder is unremarkable. Stomach/Bowel: Surgical changes of right hemicolectomy are identified. Stomach, small bowel, and large bowel are otherwise unremarkable. No free intraperitoneal gas or fluid. Vascular/Lymphatic: Moderate aortoiliac atherosclerotic calcification is seen. No aortic aneurysm. There is development of pathologic periceliac, retrocrural, Peri renal, and aortocaval pathologic adenopathy. Index aortocaval lymph node measures 15 mm in short axis diameter at axial image # 41/3. Reproductive: Status post hysterectomy. No adnexal masses. Other: Multiple soft tissue nodules developed within the lower anterior abdominal wall bilaterally which may relate to subcutaneous injection. Tiny fat containing umbilical hernia. Musculoskeletal: Degenerative changes are seen within the lumbar spine. No lytic or blastic bone lesion. Review of the MIP images confirms the above findings. IMPRESSION: 1. No pulmonary embolism. No acute intrathoracic pathology identified. 2. Interval progression of disease with enlarging primary subpleural mass within the left lower lobe, progressive left pleural metastatic disease, progressive mediastinal, left hilar, left internal mammary and pericardial adenopathy, as well  as innumerable enlarging hepatic metastases and development of pathologic intra-abdominal adenopathy. 3. Mild coronary artery calcification. 4. Status post right hemicolectomy. 5.  Aortic Atherosclerosis (ICD10-I70.0). Electronically Signed   By: Fidela Salisbury M.D.   On: 08/14/2022 16:26   CT Head Wo Contrast  Result Date: 08/14/2022 CLINICAL DATA:  Metastatic disease evaluation. Lethargy. Patient is not eating or drinking is normal. Small cell lung cancer with known brain metastases. EXAM: CT HEAD WITHOUT CONTRAST TECHNIQUE: Contiguous axial images were obtained from the base of the skull through the vertex without intravenous contrast. RADIATION DOSE REDUCTION: This exam was performed according to the departmental dose-optimization program which includes automated exposure control, adjustment of the mA and/or kV according to patient size and/or use of iterative reconstruction technique. COMPARISON:  CT head without contrast 08/14/2022 and MR head without and with contrast 07/29/2022. FINDINGS: Brain: Known lesion in the inferomedial left temporal lobe demonstrates interval hemorrhage. Lesion has expanded, now measuring 3.0 x 1.8 x 1.9 cm. Is previously measured 2.1 cm maximally. The right occipital hemorrhagic lesion has decreased in size since the prior exam. Hemorrhagic lesion in the anteromedial right frontal lobe is also decreased in size. Extensive white matter hypoattenuation in the right hemisphere so seated with the multiple lesions is similar to the prior exams. Local mass effect is associated with the acute medial left temporal lobe hemorrhagic metastasis. Effacement the adjacent sulci noted. Increased edema is so seated. Ventricles are of normal size. No significant extra-axial fluid collection is present. Vascular: No hyperdense vessel or unexpected calcification. Skull: Calvarium is intact. No focal lytic or blastic lesions are present. No significant extracranial soft tissue lesion is present.  Sinuses/Orbits: The paranasal sinuses and mastoid air cells are clear. The globes and orbits are within normal limits. IMPRESSION: 1. Interval hemorrhage into the known medial left temporal lobe hemorrhagic metastasis. Lesion has expanded measuring 3.0 x 1.8 x 1.9 cm. 2. The right occipital hemorrhagic lesion has decreased in size since the prior exam. 3.  Hemorrhagic lesion in the anteromedial right frontal lobe is also decreased in size. 4. Extensive white matter hypoattenuation in the right hemisphere so seated with the multiple lesions is similar to the prior exams. 5. Local mass effect is associated with the acute medial left temporal lobe hemorrhagic metastasis. Critical Value/emergent results were called by telephone at the time of interpretation on 08/14/2022 at 4:20 pm to provider Dr. Dene Gentry, Who verbally acknowledged these results. Electronically Signed   By: San Morelle M.D.   On: 08/14/2022 16:22   DG Chest Portable 1 View  Result Date: 08/14/2022 CLINICAL DATA:  Fatigue.  History malignancy EXAM: PORTABLE CHEST 1 VIEW COMPARISON:  07/16/2022 FINDINGS: Lobulated opacity in the left perihilar lung, notably increased from prior. Lung volumes are low with mild atelectasis at the bases. No edema, effusion, or pneumothorax. Normal heart size. Porta catheter on the right. IMPRESSION: Progressive nodule versus acute airspace disease in the left perihilar lung. Electronically Signed   By: Jorje Guild M.D.   On: 08/14/2022 12:29   MR Brain W Wo Contrast  Result Date: 07/29/2022 CLINICAL DATA:  Brain metastases.  Assess treatment response EXAM: MRI HEAD WITHOUT AND WITH CONTRAST TECHNIQUE: Multiplanar, multiecho pulse sequences of the brain and surrounding structures were obtained without and with intravenous contrast. CONTRAST:  32m GADAVIST GADOBUTROL 1 MMOL/ML IV SOLN COMPARISON:  07/09/2022 FINDINGS: Brain: There is no acute infarct or extra-axial collection. There is hemorrhage  associated with masses in the right occipital and frontal lobes. Numerous contrast-enhancing lesions, nearly all of which are now larger: 1. Large left cerebellar lesion, 18 mm, previously 15 mm series 16, image 31 2. Inferior left cerebellum, 6 mm, larger, image 27 3. Right cerebellum, 4 mm, new, image 47 4. Lateral left cerebellum, 5 mm, larger, image 56 5. Anteromedial right temporal lobe, 5 mm, larger, image 57 6. Left temporal lobe, 6 mm, larger, image 64 7. Right temporal lobe, 6 mm, larger column image 66 8. Anterior right temporal lobe, 6 mm, larger, image 67 9. Posterior left temporal lobe, 10 mm, larger, image 69 10. Medial left temporal lobe, 20 mm, larger, image 70, 11. Right occipital lobe, 34 mm, larger, image 74. Marked surrounding edema. 12. Left temporal lobe, 7 mm, larger, image 78 13. Right insula, 8 mm, larger, image 90 14. Lateral right frontal lobe fall 26 mm, larger, image 106. Marked surrounding edema. 15. Posterior left frontal lobe, 13 mm, larger, image 119 16. Paramedian right frontal lobe, 7 mm, larger, image 122 17. Right frontal lobe, 6 mm, larger, image 128 18. Anterior paramedian right frontal lobe 6 mm, new, image 130 paramedian left frontal lobe, 5 mm, new, image 134 19. Superior paramedian right frontal lobe, 17 mm, larger, image 143 20. Superior left frontal lobe, 8 mm, larger, image 146 21. Superior right frontal lobe, 8 mm, larger, image 147 There is mild mass effect in the right hemisphere without hydrocephalus or herniation. Vascular: Normal flow voids. Skull and upper cervical spine: Normal marrow signal. Sinuses/Orbits: Negative. Other: None. IMPRESSION: 1. Numerous contrast-enhancing lesions, nearly all of which are now larger, consistent with progression of metastatic disease. 2. Mild mass effect in the right hemisphere without hydrocephalus or herniation. Electronically Signed   By: KUlyses JarredM.D.   On: 07/29/2022 19:36   CT HEAD WO CONTRAST (5MM)  Result Date:  07/19/2022 CLINICAL DATA:  Initial evaluation for altered mental status. History of metastatic small cell lung cancer. EXAM: CT HEAD WITHOUT CONTRAST TECHNIQUE: Contiguous axial images  were obtained from the base of the skull through the vertex without intravenous contrast. RADIATION DOSE REDUCTION: This exam was performed according to the departmental dose-optimization program which includes automated exposure control, adjustment of the mA and/or kV according to patient size and/or use of iterative reconstruction technique. COMPARISON:  Prior head CT from 07/16/2022. FINDINGS: Brain: Multiple scattered metastatic lesions again seen. The dominant lesion at the right occipital convexity is increased in size, now measuring 3.3 x 2.6 x 2.3 cm, previously 2.7 x 2.4 x 1.8 cm when measured in a similar fashion. Additional right frontal parafalcine lesion also increased in size now measuring up to 1.5 cm, previously 1 cm. Associated hyperdensity consistent with hemorrhage/blood products. Multifocal areas of vasogenic edema again seen, worsened within the left parietal region, suggesting progressive disease (series 3, image 23). No midline shift. Basilar cisterns remain patent. No other acute intracranial hemorrhage or large vessel territory infarct. No hydrocephalus or extra-axial fluid collection. Vascular: No hyperdense vessel. Skull: Scalp soft tissues and calvarium demonstrate no acute finding. Sinuses/Orbits: Globes orbital soft tissues demonstrate no acute finding. Air-fluid level with pneumatized secretions noted within the left maxillary sinus. No mastoid effusion. Other: None. IMPRESSION: 1. Interval increase in size of multiple scattered metastatic lesions as above, consistent with progressive disease. Scattered areas of vasogenic edema appear slightly progressed and worsened as well, most notable at the left parietal region. No midline shift. 2. No other acute intracranial abnormality. 3. Air-fluid level with  pneumatized secretions within the left maxillary sinus, suggesting acute sinusitis. Electronically Signed   By: Jeannine Boga M.D.   On: 07/19/2022 23:18    Microbiology: Results for orders placed or performed during the hospital encounter of 08/14/22  Resp Panel by RT-PCR (Flu A&B, Covid) Anterior Nasal Swab     Status: None   Collection Time: 08/14/22 11:42 AM   Specimen: Anterior Nasal Swab  Result Value Ref Range Status   SARS Coronavirus 2 by RT PCR NEGATIVE NEGATIVE Final    Comment: (NOTE) SARS-CoV-2 target nucleic acids are NOT DETECTED.  The SARS-CoV-2 RNA is generally detectable in upper respiratory specimens during the acute phase of infection. The lowest concentration of SARS-CoV-2 viral copies this assay can detect is 138 copies/mL. A negative result does not preclude SARS-Cov-2 infection and should not be used as the sole basis for treatment or other patient management decisions. A negative result may occur with  improper specimen collection/handling, submission of specimen other than nasopharyngeal swab, presence of viral mutation(s) within the areas targeted by this assay, and inadequate number of viral copies(<138 copies/mL). A negative result must be combined with clinical observations, patient history, and epidemiological information. The expected result is Negative.  Fact Sheet for Patients:  EntrepreneurPulse.com.au  Fact Sheet for Healthcare Providers:  IncredibleEmployment.be  This test is no t yet approved or cleared by the [place] FDA and  has been authorized for detection and/or diagnosis of SARS-CoV-2 by FDA under an Emergency Use Authorization (EUA). This EUA will remain  in effect (meaning this test can be used) for the duration of the COVID-19 declaration under Section 564(b)(1) of the Act, 21 U.S.C.section 360bbb-3(b)(1), unless the authorization is terminated  or revoked sooner.       Influenza A  by PCR NEGATIVE NEGATIVE Final   Influenza B by PCR NEGATIVE NEGATIVE Final    Comment: (NOTE) The Xpert Xpress SARS-CoV-2/FLU/RSV plus assay is intended as an aid in the diagnosis of influenza from Nasopharyngeal swab specimens and should not be used  as a sole basis for treatment. Nasal washings and aspirates are unacceptable for Xpert Xpress SARS-CoV-2/FLU/RSV testing.  Fact Sheet for Patients: EntrepreneurPulse.com.au  Fact Sheet for Healthcare Providers: IncredibleEmployment.be  This test is not yet approved or cleared by the [place] FDA and has been authorized for detection and/or diagnosis of SARS-CoV-2 by FDA under an Emergency Use Authorization (EUA). This EUA will remain in effect (meaning this test can be used) for the duration of the COVID-19 declaration under Section 564(b)(1) of the Act, 21 U.S.C. section 360bbb-3(b)(1), unless the authorization is terminated or revoked.  Performed at [facility], [place] [address], [place], [place] [address]   Urine Culture     Status: Abnormal   Collection Time: 08/14/22  1:40 PM   Specimen: Urine, Clean Catch  Result Value Ref Range Status   Specimen Description   Final    URINE, CLEAN CATCH Performed at [facility], [place] [address] [place], [place] [address]    Special Requests   Final    NONE Performed at [facility], [place] [address] [place], [place] [address]    Culture >=100,000 COLONIES/mL ENTEROCOCCUS FAECALIS (A)  Final   Report Status 08/16/2022 FINAL  Final   Organism ID, Bacteria ENTEROCOCCUS FAECALIS (A)  Final      Susceptibility   Enterococcus faecalis - MIC*    AMPICILLIN <=2 SENSITIVE Sensitive     NITROFURANTOIN <=16 SENSITIVE Sensitive     VANCOMYCIN 1 SENSITIVE Sensitive     * >=100,000 COLONIES/mL ENTEROCOCCUS FAECALIS    Labs: CBC: Recent Labs  Lab 08/14/22 1141 08/15/22 0451  WBC 10.7* 7.9   NEUTROABS 8.8*  --   HGB 9.4* 8.5*  HCT 29.0* 26.5*  MCV 101.0* 101.9*  PLT 121* 237*   Basic Metabolic Panel: Recent Labs  Lab 08/14/22 1141 08/15/22 0451  NA 131* 134*  K 4.5 3.7  CL 96* 97*  CO2 24 25  GLUCOSE 153* 112*  BUN 20 18  CREATININE 0.69 0.71  CALCIUM 8.3* 8.5*   Liver Function Tests: Recent Labs  Lab 08/14/22 1141 08/15/22 0451  AST 111* 122*  ALT 82* 81*  ALKPHOS 333* 305*  BILITOT 2.2* 1.8*  PROT 6.9 6.3*  ALBUMIN 2.5* 2.4*   CBG: Recent Labs  Lab 08/15/22 1153 08/15/22 1620 08/16/22 0006 08/16/22 0507 08/16/22 0807  GLUCAP 109* 161* 119* 136* 152*    Discharge time spent: less than 30 minutes.  Signed: Marylu Lund, MD [facility] 08/17/2022

## 2022-08-17 NOTE — Progress Notes (Signed)
Pamela [name] continues to decline gradually.  I know this morning, she really was not that alert.  She was quite lethargic.  Some of that may have been from having some pain medication.  Her husband was with her.  He said that she had a quiet night.  She is not eating.  I am not surprised by this.  We are awaiting the bed at [facility].  Hopefully there is one for her this weekend.  I think the fact that she is not eating really is going to speed of her decline.  Given that, I just suspect that she probably will not make it more than 2 weeks.  She seems to be relatively comfortable.  There is no dyspnea.  I really do not think that she needs the cardiac monitor at this point.  We really are in comfort mode.  I just want to make sure that she has comfort, respect and dignity.  Again she seems comfortable right now.  She is quite lethargic which is okay.  I know that she is having a hard time taking pills.  As such, I will know how her seizure medications can be given when she goes to the Hospice Home.  I do appreciate the incredibly compassionate care that she has received from everybody up on [address].  [name], MD  2 Timothy 4:16-18

## 2022-08-17 NOTE — Progress Notes (Signed)
[facility]) Hospital Liaison Note  Bed was offered and accepted at the hospice home today. Daughter, Duwaine Maxin is agreeable to transfer today.   Hospital staff and [facility] aware.  RN, please call report to [phone] prior to patient leaving the unit. Please send signed and completed DNR with patient at discharge.   Thank you, Zigmund Gottron RN  [facility] Liaison [phone]

## 2022-08-17 NOTE — Progress Notes (Signed)
Per Dr. Marin Olp, discontinue telemetry. RN called tele tech & informed

## 2022-08-19 ENCOUNTER — Encounter: Payer: Self-pay | Admitting: *Deleted

## 2022-08-19 LAB — LEVETIRACETAM LEVEL: Levetiracetam Lvl: 2.5 ug/mL — ABNORMAL LOW (ref 10.0–40.0)

## 2022-08-19 NOTE — Progress Notes (Signed)
Patient discharged from hospital to residential hospice. Will discontinue active navigation but be available to patient/family as needed.   Oncology Nurse Navigator Documentation     08/19/2022    7:30 AM  Oncology Nurse Navigator Flowsheets  Navigation Complete Date: 08/19/2022  Post Navigation: Continue to Follow Patient? No  Reason Not Navigating Patient: Airline pilot Encounter Type Appt/Treatment Plan Review  Patient Visit Type MedOnc  Treatment Phase Other  Support Groups/Services Friends and Family  Time Spent with Patient 15

## 2022-08-20 ENCOUNTER — Inpatient Hospital Stay: Payer: BC Managed Care – PPO | Admitting: Nurse Practitioner

## 2022-08-22 ENCOUNTER — Telehealth: Payer: Self-pay | Admitting: *Deleted

## 2022-08-28 NOTE — Progress Notes (Signed)
                                                                                                                                                             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   Plan: The patient has been scheduled for followup in 14mo. -----------------------------------  Eppie Gibson, MD

## 2022-08-29 ENCOUNTER — Other Ambulatory Visit: Payer: BC Managed Care – PPO

## 2022-09-03 ENCOUNTER — Inpatient Hospital Stay: Admit: 2022-09-03 | Payer: Self-pay | Admitting: Radiation Oncology

## 2022-09-16 NOTE — Telephone Encounter (Signed)
Call received from patient's daughter Anderson Malta to inform Dr. Marin Olp that pt passed away last night at the hospice home in [place]. Dr. Marin Olp notified.

## 2022-09-16 DEATH — deceased
